# Patient Record
Sex: Male | Born: 1980 | Race: Black or African American | Hispanic: No | Marital: Single | Smoking: Never smoker
Health system: Southern US, Community
[De-identification: ages and names within clinical notes are randomized; demographics above are authoritative.]

## PROBLEM LIST (undated history)

## (undated) DIAGNOSIS — F329 Major depressive disorder, single episode, unspecified: Secondary | ICD-10-CM

## (undated) DIAGNOSIS — E1142 Type 2 diabetes mellitus with diabetic polyneuropathy: Secondary | ICD-10-CM

## (undated) DIAGNOSIS — D649 Anemia, unspecified: Secondary | ICD-10-CM

## (undated) DIAGNOSIS — N186 End stage renal disease: Secondary | ICD-10-CM

## (undated) DIAGNOSIS — R569 Unspecified convulsions: Secondary | ICD-10-CM

## (undated) DIAGNOSIS — H5440 Blindness, one eye, unspecified eye: Secondary | ICD-10-CM

## (undated) DIAGNOSIS — F32A Depression, unspecified: Secondary | ICD-10-CM

## (undated) DIAGNOSIS — R011 Cardiac murmur, unspecified: Secondary | ICD-10-CM

## (undated) DIAGNOSIS — I469 Cardiac arrest, cause unspecified: Secondary | ICD-10-CM

## (undated) DIAGNOSIS — E109 Type 1 diabetes mellitus without complications: Secondary | ICD-10-CM

## (undated) DIAGNOSIS — Z992 Dependence on renal dialysis: Secondary | ICD-10-CM

## (undated) DIAGNOSIS — I10 Essential (primary) hypertension: Secondary | ICD-10-CM

## (undated) HISTORY — PX: EYE SURGERY: SHX253

---
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
## 2002-01-07 ENCOUNTER — Inpatient Hospital Stay (HOSPITAL_COMMUNITY): Admission: EM | Admit: 2002-01-07 | Discharge: 2002-01-09 | Payer: Self-pay | Admitting: Emergency Medicine

## 2002-01-07 ENCOUNTER — Encounter: Payer: Self-pay | Admitting: Infectious Diseases

## 2002-01-08 ENCOUNTER — Encounter: Payer: Self-pay | Admitting: Infectious Diseases

## 2002-01-16 ENCOUNTER — Encounter: Admission: RE | Admit: 2002-01-16 | Discharge: 2002-01-16 | Payer: Self-pay | Admitting: Internal Medicine

## 2002-03-10 ENCOUNTER — Emergency Department (HOSPITAL_COMMUNITY): Admission: EM | Admit: 2002-03-10 | Discharge: 2002-03-10 | Payer: Self-pay

## 2002-03-15 ENCOUNTER — Encounter: Admission: RE | Admit: 2002-03-15 | Discharge: 2002-03-15 | Payer: Self-pay | Admitting: Internal Medicine

## 2002-04-15 ENCOUNTER — Inpatient Hospital Stay (HOSPITAL_COMMUNITY): Admission: EM | Admit: 2002-04-15 | Discharge: 2002-04-17 | Payer: Self-pay | Admitting: Emergency Medicine

## 2002-04-15 ENCOUNTER — Encounter: Payer: Self-pay | Admitting: Emergency Medicine

## 2002-04-16 ENCOUNTER — Encounter: Payer: Self-pay | Admitting: Internal Medicine

## 2002-04-17 ENCOUNTER — Encounter: Admission: RE | Admit: 2002-04-17 | Discharge: 2002-04-17 | Payer: Self-pay | Admitting: Internal Medicine

## 2002-04-27 ENCOUNTER — Emergency Department (HOSPITAL_COMMUNITY): Admission: EM | Admit: 2002-04-27 | Discharge: 2002-04-28 | Payer: Self-pay | Admitting: Emergency Medicine

## 2002-05-04 ENCOUNTER — Observation Stay (HOSPITAL_COMMUNITY): Admission: EM | Admit: 2002-05-04 | Discharge: 2002-05-05 | Payer: Self-pay | Admitting: Emergency Medicine

## 2002-05-04 ENCOUNTER — Encounter: Payer: Self-pay | Admitting: Emergency Medicine

## 2002-05-26 ENCOUNTER — Inpatient Hospital Stay (HOSPITAL_COMMUNITY): Admission: EM | Admit: 2002-05-26 | Discharge: 2002-05-29 | Payer: Self-pay | Admitting: Emergency Medicine

## 2002-07-21 ENCOUNTER — Encounter: Payer: Self-pay | Admitting: Internal Medicine

## 2002-07-21 ENCOUNTER — Inpatient Hospital Stay (HOSPITAL_COMMUNITY): Admission: EM | Admit: 2002-07-21 | Discharge: 2002-07-22 | Payer: Self-pay | Admitting: Emergency Medicine

## 2002-07-24 ENCOUNTER — Inpatient Hospital Stay (HOSPITAL_COMMUNITY): Admission: EM | Admit: 2002-07-24 | Discharge: 2002-07-25 | Payer: Self-pay

## 2002-07-29 ENCOUNTER — Encounter: Admission: RE | Admit: 2002-07-29 | Discharge: 2002-07-29 | Payer: Self-pay | Admitting: Internal Medicine

## 2002-11-21 ENCOUNTER — Emergency Department (HOSPITAL_COMMUNITY): Admission: EM | Admit: 2002-11-21 | Discharge: 2002-11-22 | Payer: Self-pay

## 2002-11-22 ENCOUNTER — Inpatient Hospital Stay (HOSPITAL_COMMUNITY): Admission: EM | Admit: 2002-11-22 | Discharge: 2002-11-26 | Payer: Self-pay

## 2002-12-29 ENCOUNTER — Encounter: Payer: Self-pay | Admitting: Emergency Medicine

## 2002-12-29 ENCOUNTER — Inpatient Hospital Stay (HOSPITAL_COMMUNITY): Admission: EM | Admit: 2002-12-29 | Discharge: 2003-01-01 | Payer: Self-pay | Admitting: *Deleted

## 2003-02-03 ENCOUNTER — Inpatient Hospital Stay (HOSPITAL_COMMUNITY): Admission: EM | Admit: 2003-02-03 | Discharge: 2003-02-05 | Payer: Self-pay | Admitting: Emergency Medicine

## 2003-04-04 ENCOUNTER — Inpatient Hospital Stay (HOSPITAL_COMMUNITY): Admission: EM | Admit: 2003-04-04 | Discharge: 2003-04-08 | Payer: Self-pay | Admitting: Emergency Medicine

## 2003-10-26 ENCOUNTER — Emergency Department (HOSPITAL_COMMUNITY): Admission: EM | Admit: 2003-10-26 | Discharge: 2003-10-26 | Payer: Self-pay | Admitting: Emergency Medicine

## 2003-12-26 ENCOUNTER — Emergency Department (HOSPITAL_COMMUNITY): Admission: EM | Admit: 2003-12-26 | Discharge: 2003-12-26 | Payer: Self-pay | Admitting: Emergency Medicine

## 2004-01-21 ENCOUNTER — Inpatient Hospital Stay (HOSPITAL_COMMUNITY): Admission: EM | Admit: 2004-01-21 | Discharge: 2004-01-23 | Payer: Self-pay | Admitting: Emergency Medicine

## 2004-02-04 ENCOUNTER — Ambulatory Visit: Payer: Self-pay | Admitting: Internal Medicine

## 2004-02-17 ENCOUNTER — Inpatient Hospital Stay (HOSPITAL_COMMUNITY): Admission: EM | Admit: 2004-02-17 | Discharge: 2004-02-18 | Payer: Self-pay

## 2005-10-12 ENCOUNTER — Emergency Department (HOSPITAL_COMMUNITY): Admission: EM | Admit: 2005-10-12 | Discharge: 2005-10-13 | Payer: Self-pay | Admitting: Emergency Medicine

## 2012-09-05 DIAGNOSIS — I10 Essential (primary) hypertension: Secondary | ICD-10-CM | POA: Diagnosis not present

## 2012-09-05 DIAGNOSIS — E11319 Type 2 diabetes mellitus with unspecified diabetic retinopathy without macular edema: Secondary | ICD-10-CM | POA: Diagnosis not present

## 2012-09-05 DIAGNOSIS — Z13220 Encounter for screening for lipoid disorders: Secondary | ICD-10-CM | POA: Diagnosis not present

## 2012-09-05 DIAGNOSIS — N529 Male erectile dysfunction, unspecified: Secondary | ICD-10-CM | POA: Diagnosis not present

## 2012-09-05 DIAGNOSIS — E109 Type 1 diabetes mellitus without complications: Secondary | ICD-10-CM | POA: Diagnosis not present

## 2012-09-13 DIAGNOSIS — E11359 Type 2 diabetes mellitus with proliferative diabetic retinopathy without macular edema: Secondary | ICD-10-CM | POA: Diagnosis not present

## 2012-09-13 DIAGNOSIS — E1039 Type 1 diabetes mellitus with other diabetic ophthalmic complication: Secondary | ICD-10-CM | POA: Diagnosis not present

## 2012-09-26 ENCOUNTER — Encounter (INDEPENDENT_AMBULATORY_CARE_PROVIDER_SITE_OTHER): Payer: Self-pay | Admitting: Ophthalmology

## 2012-09-27 ENCOUNTER — Encounter (INDEPENDENT_AMBULATORY_CARE_PROVIDER_SITE_OTHER): Payer: Self-pay | Admitting: Ophthalmology

## 2012-10-17 ENCOUNTER — Encounter (INDEPENDENT_AMBULATORY_CARE_PROVIDER_SITE_OTHER): Payer: Self-pay | Admitting: Ophthalmology

## 2012-11-02 DIAGNOSIS — E785 Hyperlipidemia, unspecified: Secondary | ICD-10-CM | POA: Diagnosis not present

## 2012-11-02 DIAGNOSIS — I10 Essential (primary) hypertension: Secondary | ICD-10-CM | POA: Diagnosis not present

## 2012-11-02 DIAGNOSIS — E109 Type 1 diabetes mellitus without complications: Secondary | ICD-10-CM | POA: Diagnosis not present

## 2012-11-06 ENCOUNTER — Emergency Department (HOSPITAL_COMMUNITY)
Admission: EM | Admit: 2012-11-06 | Discharge: 2012-11-06 | Disposition: A | Discharge status: Home / Self Care | Payer: Medicare Other | Attending: Emergency Medicine | Admitting: Emergency Medicine

## 2012-11-06 ENCOUNTER — Encounter (HOSPITAL_COMMUNITY): Payer: Self-pay | Admitting: *Deleted

## 2012-11-06 DIAGNOSIS — Z794 Long term (current) use of insulin: Secondary | ICD-10-CM | POA: Diagnosis not present

## 2012-11-06 DIAGNOSIS — Z79899 Other long term (current) drug therapy: Secondary | ICD-10-CM | POA: Insufficient documentation

## 2012-11-06 DIAGNOSIS — N289 Disorder of kidney and ureter, unspecified: Secondary | ICD-10-CM | POA: Insufficient documentation

## 2012-11-06 DIAGNOSIS — I10 Essential (primary) hypertension: Secondary | ICD-10-CM | POA: Insufficient documentation

## 2012-11-06 DIAGNOSIS — E1169 Type 2 diabetes mellitus with other specified complication: Secondary | ICD-10-CM | POA: Diagnosis not present

## 2012-11-06 DIAGNOSIS — E162 Hypoglycemia, unspecified: Secondary | ICD-10-CM

## 2012-11-06 HISTORY — DX: Essential (primary) hypertension: I10

## 2012-11-06 LAB — CBC WITH DIFFERENTIAL/PLATELET
Basophils Absolute: 0 K/uL (ref 0.0–0.1)
Basophils Relative: 0 % (ref 0–1)
Eosinophils Absolute: 0 K/uL (ref 0.0–0.7)
Eosinophils Relative: 0 % (ref 0–5)
HCT: 36.1 % — ABNORMAL LOW (ref 39.0–52.0)
Hemoglobin: 12.3 g/dL — ABNORMAL LOW (ref 13.0–17.0)
Lymphocytes Relative: 16 % (ref 12–46)
Lymphs Abs: 1.4 K/uL (ref 0.7–4.0)
MCH: 28.1 pg (ref 26.0–34.0)
MCHC: 34.1 g/dL (ref 30.0–36.0)
MCV: 82.6 fL (ref 78.0–100.0)
Monocytes Absolute: 0.5 K/uL (ref 0.1–1.0)
Monocytes Relative: 5 % (ref 3–12)
Neutro Abs: 7 K/uL (ref 1.7–7.7)
Neutrophils Relative %: 78 % — ABNORMAL HIGH (ref 43–77)
Platelets: 292 K/uL (ref 150–400)
RBC: 4.37 MIL/uL (ref 4.22–5.81)
RDW: 13.3 % (ref 11.5–15.5)
WBC: 8.9 K/uL (ref 4.0–10.5)

## 2012-11-06 LAB — BASIC METABOLIC PANEL WITH GFR
BUN: 17 mg/dL (ref 6–23)
CO2: 20 meq/L (ref 19–32)
Calcium: 8.1 mg/dL — ABNORMAL LOW (ref 8.4–10.5)
Chloride: 106 meq/L (ref 96–112)
Creatinine, Ser: 1.64 mg/dL — ABNORMAL HIGH (ref 0.50–1.35)
GFR calc Af Amer: 63 mL/min — ABNORMAL LOW
GFR calc non Af Amer: 55 mL/min — ABNORMAL LOW
Glucose, Bld: 285 mg/dL — ABNORMAL HIGH (ref 70–99)
Potassium: 4.3 meq/L (ref 3.5–5.1)
Sodium: 134 meq/L — ABNORMAL LOW (ref 135–145)

## 2012-11-06 LAB — GLUCOSE, CAPILLARY
Glucose-Capillary: 162 mg/dL — ABNORMAL HIGH (ref 70–99)
Glucose-Capillary: 253 mg/dL — ABNORMAL HIGH (ref 70–99)

## 2012-11-06 MED ORDER — MORPHINE SULFATE 4 MG/ML IJ SOLN: 4.0000 mg | Freq: Once | INTRAMUSCULAR | Status: DC

## 2012-11-06 MED ORDER — MORPHINE SULFATE 2 MG/ML IJ SOLN
2.0000 mg | Freq: Once | INTRAMUSCULAR | Status: AC
  Administered 2012-11-06: 2 mg via INTRAVENOUS

## 2012-11-06 MED ORDER — MORPHINE SULFATE 2 MG/ML IJ SOLN
INTRAMUSCULAR | Status: AC
  Administered 2012-11-06: 2 mg via INTRAVENOUS
  Filled 2012-11-06: qty 1

## 2012-11-06 MED ORDER — LIDOCAINE HCL (CARDIAC) 20 MG/ML IV SOLN
50.0000 mg | Freq: Once | INTRAVENOUS | Status: AC
  Administered 2012-11-06: 50 mg via INTRAVENOUS

## 2012-11-06 MED ORDER — LIDOCAINE HCL (CARDIAC) 20 MG/ML IV SOLN
INTRAVENOUS | Status: AC
  Administered 2012-11-06: 50 mg via INTRAVENOUS
  Filled 2012-11-06: qty 5

## 2012-11-06 NOTE — ED Notes (Signed)
Dr. Roxanne Mins into room

## 2012-11-06 NOTE — ED Notes (Signed)
Given juice per request

## 2012-11-06 NOTE — ED Notes (Signed)
RT at [facility] drawing labs from R radial artery.

## 2012-11-06 NOTE — ED Provider Notes (Signed)
CSN: [id number]     Arrival date & time 11/06/12  0114 History   First MD Initiated Contact with Patient 11/06/12 0235     Chief Complaint  Patient presents with  . Hypoglycemia   (Consider location/radiation/quality/duration/timing/severity/associated sxs/prior Treatment) Patient is a 32 y.o. male presenting with hypoglycemia. The history is provided by the patient.  Hypoglycemia He was found unresponsive by his father. EMS arrived and blood sugar was 24. You're unable to get an IV started and he did not respond to IV glucagon so intraosseous line was started and he was given dextrose through the intraosseous line with and regained normal mentation. He states that he been eating normally. His blood sugar of 5 PM was about 330 and he took 4 units of insulin for coverage. He ate his normal dinner. He does not remember getting shaky or jittery or sweaty.  Past Medical History  Diagnosis Date  . Diabetes mellitus without complication   . Hypertension    History reviewed. No pertinent past surgical history. No family history on file. History  Substance Use Topics  . Smoking status: Never Smoker   . Smokeless tobacco: Not on file  . Alcohol Use: Yes     Comment: rare    Review of Systems  All other systems reviewed and are negative.    Allergies  Review of patient's allergies indicates no known allergies.  Home Medications   Current Outpatient Rx  Name  Route  Sig  Dispense  Refill  . insulin glargine (LANTUS) 100 UNIT/ML injection   Subcutaneous   Inject 40 Units into the skin every morning.         . insulin lispro (HUMALOG) 100 UNIT/ML injection   Subcutaneous   Inject 2-10 Units into the skin 3 (three) times daily before meals.         Marland Kitchen lisinopril (PRINIVIL,ZESTRIL) 10 MG tablet   Oral   Take 20 mg by mouth daily.         . simvastatin (ZOCOR) 20 MG tablet   Oral   Take 20 mg by mouth every evening.          BP 164/94  Pulse 81  Temp(Src) 98 F (36.7  C) (Oral)  Resp 20  Ht 5\' 11"  (1.803 m)  Wt 200 lb (90.719 kg)  BMI 27.91 kg/m2  SpO2 98% Physical Exam  Nursing note and vitals reviewed.  .32 year old male, resting comfortably and in no acute distress. Vital signs are significant for hypertension with blood pressure 164/94. Oxygen saturation is 98%, which is normal. Head is normocephalic and atraumatic. PERRLA, EOMI. Oropharynx is clear. Neck is nontender and supple without adenopathy or JVD. Back is nontender and there is no CVA tenderness. Lungs are clear without rales, wheezes, or rhonchi. Chest is nontender. Heart has regular rate and rhythm without murmur. Abdomen is soft, flat, nontender without masses or hepatosplenomegaly and peristalsis is normoactive. Extremities have no cyanosis or edema, full range of motion is present. Skin is warm and dry without rash. Neurologic: Mental status is normal, cranial nerves are intact, there are no motor or sensory deficits.  ED Course  Procedures (including critical care time) Labs Review Results for orders placed during the hospital encounter of 11/06/12  GLUCOSE, CAPILLARY      Result Value Range   Glucose-Capillary 162 (*) 70 - 99 mg/dL   Comment 1 Notify RN     Comment 2 Documented in Chart    BASIC METABOLIC PANEL  Result Value Range   Sodium 134 (*) 135 - 145 mEq/L   Potassium 4.3  3.5 - 5.1 mEq/L   Chloride 106  96 - 112 mEq/L   CO2 20  19 - 32 mEq/L   Glucose, Bld 285 (*) 70 - 99 mg/dL   BUN 17  6 - 23 mg/dL   Creatinine, Ser 1.64 (*) 0.50 - 1.35 mg/dL   Calcium 8.1 (*) 8.4 - 10.5 mg/dL   GFR calc non Af Amer 55 (*) >90 mL/min   GFR calc Af Amer 63 (*) >90 mL/min  CBC WITH DIFFERENTIAL      Result Value Range   WBC 8.9  4.0 - 10.5 K/uL   RBC 4.37  4.22 - 5.81 MIL/uL   Hemoglobin 12.3 (*) 13.0 - 17.0 g/dL   HCT 36.1 (*) 39.0 - 52.0 %   MCV 82.6  78.0 - 100.0 fL   MCH 28.1  26.0 - 34.0 pg   MCHC 34.1  30.0 - 36.0 g/dL   RDW 13.3  11.5 - 15.5 %   Platelets  292  150 - 400 K/uL   Neutrophils Relative % 78 (*) 43 - 77 %   Neutro Abs 7.0  1.7 - 7.7 K/uL   Lymphocytes Relative 16  12 - 46 %   Lymphs Abs 1.4  0.7 - 4.0 K/uL   Monocytes Relative 5  3 - 12 %   Monocytes Absolute 0.5  0.1 - 1.0 K/uL   Eosinophils Relative 0  0 - 5 %   Eosinophils Absolute 0.0  0.0 - 0.7 K/uL   Basophils Relative 0  0 - 1 %   Basophils Absolute 0.0  0.0 - 0.1 K/uL  GLUCOSE, CAPILLARY      Result Value Range   Glucose-Capillary 253 (*) 70 - 99 mg/dL   Comment 1 Documented in Chart     Comment 2 Notify RN    GLUCOSE, CAPILLARY      Result Value Range   Glucose-Capillary 306 (*) 70 - 99 mg/dL   Comment 1 Documented in Chart     Comment 2 Notify RN     MDM   1. Hypoglycemia   2. Renal insufficiency    Hypoglycemia. Blood sugar has come up with dextrose and he has been given a meal in the ED. Renal insufficiency is noted. Mild hyponatremia is present but is clearly from hyperglycemia as blood sugar on metabolic panel was [id number] following intravenous glucose.  Blood sugar has remained stable in the ED and he is discharged. Because of severe lability of his blood sugars, and discussed with him the desirability of this being a diabetic specialist. I also discussed the implication renal insufficiency can have on his blood sugar management.  Delora Fuel, MD [phone] [id number]

## 2012-11-06 NOTE — ED Notes (Signed)
Father into room, at [facility].

## 2012-11-06 NOTE — ED Notes (Addendum)
Here by EMS from home for low blood sugar, EMS called by parents, pt found unresponsive with snoring respirations, cbg found to be 24. EMS unable to establish IV. Glucagon given in deltoid. IO started in R tibia (painful to pt). Lidocaine 40mg , versed 2.5mg , fentanyl 18mcg, and D10 250cc given IV. cbg PTA was 126. Pt alert, NAD, calm, interactive, lying flat supine on scoop LSB (for transfer purposes only), h/o htn and DM, takes regular humalog, lantus, lisinopril and simvastatin. Last ate 1830. humalog 4 units of coverage at 1830 reported.

## 2012-11-06 NOTE — ED Notes (Signed)
Alert, NAD, calm, interactive, skin W&D, resps e/u, speaking in clear complete sentences, eating sandwich, pending lab results. Moved to room D34.

## 2013-02-08 ENCOUNTER — Emergency Department (HOSPITAL_COMMUNITY)
Admission: EM | Admit: 2013-02-08 | Discharge: 2013-02-08 | Disposition: A | Discharge status: Home / Self Care | Payer: Medicare Other | Attending: Emergency Medicine | Admitting: Emergency Medicine

## 2013-02-08 ENCOUNTER — Encounter (HOSPITAL_COMMUNITY): Payer: Self-pay | Admitting: Emergency Medicine

## 2013-02-08 DIAGNOSIS — Z79899 Other long term (current) drug therapy: Secondary | ICD-10-CM | POA: Insufficient documentation

## 2013-02-08 DIAGNOSIS — I10 Essential (primary) hypertension: Secondary | ICD-10-CM | POA: Diagnosis not present

## 2013-02-08 DIAGNOSIS — E162 Hypoglycemia, unspecified: Secondary | ICD-10-CM

## 2013-02-08 DIAGNOSIS — E1169 Type 2 diabetes mellitus with other specified complication: Secondary | ICD-10-CM | POA: Insufficient documentation

## 2013-02-08 DIAGNOSIS — R51 Headache: Secondary | ICD-10-CM | POA: Diagnosis not present

## 2013-02-08 DIAGNOSIS — Z794 Long term (current) use of insulin: Secondary | ICD-10-CM | POA: Insufficient documentation

## 2013-02-08 LAB — GLUCOSE, CAPILLARY: Glucose-Capillary: 91 mg/dL (ref 70–99)

## 2013-02-08 MED ORDER — OXYCODONE-ACETAMINOPHEN 5-325 MG PO TABS
1.0000 | ORAL_TABLET | Freq: Once | ORAL | Status: AC
  Administered 2013-02-08: 1 via ORAL
  Filled 2013-02-08: qty 1

## 2013-02-08 NOTE — ED Notes (Signed)
EMS was called for low blood sugar and headache,

## 2013-02-08 NOTE — ED Notes (Signed)
Pt states he has a headache that started today,  Lights are dimmed for comfort measures,  Pt is given sandwich and diet coke for low glucose,  Pt has also received D50 1 amp by EMS

## 2013-02-08 NOTE — ED Notes (Signed)
Bed: WA08 Expected date:  Expected time:  Means of arrival:  Comments: EMS hypoglycemia

## 2013-02-08 NOTE — ED Provider Notes (Signed)
CSN: [id number]     Arrival date & time 02/08/13  2128 History   First MD Initiated Contact with Patient 02/08/13 2243     Chief Complaint  Patient presents with  . Hypoglycemia  . Headache    HPI The patient reports compliance with his medications.  He reports he last today and was found to be hypoglycemic.  He reports mild headache at this time.  No nausea vomiting diarrhea.  She's recently his blood sugars have been running low and his endocrinologist has been decreasing his nighttime Lantus dose.  No recent fevers or chills.  No recent illness.  Symptoms are mild in severity.  EMS gave an amp of D50.  She feels better this time.  Patient tolerated food in the ER   Past Medical History  Diagnosis Date  . Diabetes mellitus without complication   . Hypertension    History reviewed. No pertinent past surgical history. History reviewed. No pertinent family history. History  Substance Use Topics  . Smoking status: Never Smoker   . Smokeless tobacco: Not on file  . Alcohol Use: Yes     Comment: rare    Review of Systems  All other systems reviewed and are negative.    Allergies  Review of patient's allergies indicates no known allergies.  Home Medications   Current Outpatient Rx  Name  Route  Sig  Dispense  Refill  . insulin glargine (LANTUS) 100 UNIT/ML injection   Subcutaneous   Inject 40 Units into the skin every morning.         . insulin lispro (HUMALOG) 100 UNIT/ML injection   Subcutaneous   Inject 2-10 Units into the skin 3 (three) times daily before meals.         Marland Kitchen lisinopril (PRINIVIL,ZESTRIL) 10 MG tablet   Oral   Take 10 mg by mouth daily.           BP 174/98  Pulse 86  Temp(Src) 97.7 F (36.5 C) (Oral)  Resp 16  SpO2 98% Physical Exam  Nursing note and vitals reviewed. Constitutional: He is oriented to person, place, and time. He appears well-developed and well-nourished.  HENT:  Head: Normocephalic and atraumatic.  Eyes: EOM are normal.   Neck: Normal range of motion.  Cardiovascular: Normal rate, regular rhythm, normal heart sounds and intact distal pulses.   Pulmonary/Chest: Effort normal and breath sounds normal. No respiratory distress.  Abdominal: Soft. He exhibits no distension. There is no tenderness.  Musculoskeletal: Normal range of motion.  Neurological: He is alert and oriented to person, place, and time.  Skin: Skin is warm and dry.  Psychiatric: He has a normal mood and affect. Judgment normal.    ED Course  Procedures (including critical care time) Labs Review Labs Reviewed  GLUCOSE, CAPILLARY   Imaging Review No results found.  EKG Interpretation   None       MDM   1. Hypoglycemia    This will decrease his Lantus dose to 35 units.  I've instructed him to followup with his endocrinologist.  Have asked that he eat consistent meals    Hoy Morn, MD 02/08/13 2300

## 2013-03-06 DIAGNOSIS — I10 Essential (primary) hypertension: Secondary | ICD-10-CM | POA: Diagnosis not present

## 2013-03-06 DIAGNOSIS — E109 Type 1 diabetes mellitus without complications: Secondary | ICD-10-CM | POA: Diagnosis not present

## 2013-03-06 DIAGNOSIS — N529 Male erectile dysfunction, unspecified: Secondary | ICD-10-CM | POA: Diagnosis not present

## 2013-03-06 DIAGNOSIS — E785 Hyperlipidemia, unspecified: Secondary | ICD-10-CM | POA: Diagnosis not present

## 2013-05-31 ENCOUNTER — Emergency Department (HOSPITAL_COMMUNITY): Payer: Medicare Other

## 2013-05-31 ENCOUNTER — Inpatient Hospital Stay (HOSPITAL_COMMUNITY)
Admit: 2013-05-31 | Discharge: 2013-05-31 | Disposition: A | Discharge status: Home / Self Care | Payer: Medicare Other | Attending: Internal Medicine | Admitting: Internal Medicine

## 2013-05-31 ENCOUNTER — Inpatient Hospital Stay (HOSPITAL_COMMUNITY)
Admission: EM | Admit: 2013-05-31 | Discharge: 2013-06-04 | DRG: 101 | Disposition: A | Discharge status: Home / Self Care | Payer: Medicare Other | Attending: Internal Medicine | Admitting: Internal Medicine

## 2013-05-31 ENCOUNTER — Encounter (HOSPITAL_COMMUNITY): Payer: Self-pay | Admitting: Emergency Medicine

## 2013-05-31 DIAGNOSIS — R413 Other amnesia: Secondary | ICD-10-CM | POA: Diagnosis present

## 2013-05-31 DIAGNOSIS — N183 Chronic kidney disease, stage 3 unspecified: Secondary | ICD-10-CM | POA: Diagnosis present

## 2013-05-31 DIAGNOSIS — IMO0001 Reserved for inherently not codable concepts without codable children: Secondary | ICD-10-CM | POA: Diagnosis present

## 2013-05-31 DIAGNOSIS — H539 Unspecified visual disturbance: Secondary | ICD-10-CM | POA: Diagnosis not present

## 2013-05-31 DIAGNOSIS — E1069 Type 1 diabetes mellitus with other specified complication: Secondary | ICD-10-CM | POA: Diagnosis present

## 2013-05-31 DIAGNOSIS — Z8249 Family history of ischemic heart disease and other diseases of the circulatory system: Secondary | ICD-10-CM | POA: Diagnosis not present

## 2013-05-31 DIAGNOSIS — R0789 Other chest pain: Secondary | ICD-10-CM | POA: Diagnosis not present

## 2013-05-31 DIAGNOSIS — N179 Acute kidney failure, unspecified: Secondary | ICD-10-CM | POA: Diagnosis present

## 2013-05-31 DIAGNOSIS — I43 Cardiomyopathy in diseases classified elsewhere: Secondary | ICD-10-CM | POA: Diagnosis present

## 2013-05-31 DIAGNOSIS — H543 Unqualified visual loss, both eyes: Secondary | ICD-10-CM | POA: Diagnosis present

## 2013-05-31 DIAGNOSIS — I130 Hypertensive heart and chronic kidney disease with heart failure and stage 1 through stage 4 chronic kidney disease, or unspecified chronic kidney disease: Secondary | ICD-10-CM | POA: Diagnosis present

## 2013-05-31 DIAGNOSIS — R569 Unspecified convulsions: Secondary | ICD-10-CM | POA: Diagnosis present

## 2013-05-31 DIAGNOSIS — I10 Essential (primary) hypertension: Secondary | ICD-10-CM

## 2013-05-31 DIAGNOSIS — Z794 Long term (current) use of insulin: Secondary | ICD-10-CM | POA: Diagnosis not present

## 2013-05-31 DIAGNOSIS — G40909 Epilepsy, unspecified, not intractable, without status epilepticus: Secondary | ICD-10-CM | POA: Diagnosis present

## 2013-05-31 DIAGNOSIS — R609 Edema, unspecified: Secondary | ICD-10-CM | POA: Diagnosis not present

## 2013-05-31 DIAGNOSIS — N182 Chronic kidney disease, stage 2 (mild): Secondary | ICD-10-CM | POA: Diagnosis present

## 2013-05-31 DIAGNOSIS — E119 Type 2 diabetes mellitus without complications: Secondary | ICD-10-CM

## 2013-05-31 DIAGNOSIS — R5381 Other malaise: Secondary | ICD-10-CM | POA: Diagnosis present

## 2013-05-31 DIAGNOSIS — E875 Hyperkalemia: Secondary | ICD-10-CM | POA: Diagnosis not present

## 2013-05-31 DIAGNOSIS — M79609 Pain in unspecified limb: Secondary | ICD-10-CM | POA: Diagnosis not present

## 2013-05-31 DIAGNOSIS — R5383 Other fatigue: Secondary | ICD-10-CM

## 2013-05-31 DIAGNOSIS — M6282 Rhabdomyolysis: Secondary | ICD-10-CM | POA: Diagnosis present

## 2013-05-31 DIAGNOSIS — N189 Chronic kidney disease, unspecified: Secondary | ICD-10-CM

## 2013-05-31 DIAGNOSIS — I509 Heart failure, unspecified: Secondary | ICD-10-CM

## 2013-05-31 DIAGNOSIS — M7989 Other specified soft tissue disorders: Secondary | ICD-10-CM | POA: Diagnosis present

## 2013-05-31 DIAGNOSIS — R404 Transient alteration of awareness: Secondary | ICD-10-CM | POA: Diagnosis present

## 2013-05-31 DIAGNOSIS — I5022 Chronic systolic (congestive) heart failure: Secondary | ICD-10-CM | POA: Diagnosis not present

## 2013-05-31 DIAGNOSIS — R4182 Altered mental status, unspecified: Secondary | ICD-10-CM | POA: Diagnosis not present

## 2013-05-31 DIAGNOSIS — M79641 Pain in right hand: Secondary | ICD-10-CM

## 2013-05-31 DIAGNOSIS — I369 Nonrheumatic tricuspid valve disorder, unspecified: Secondary | ICD-10-CM | POA: Diagnosis not present

## 2013-05-31 HISTORY — DX: Unspecified convulsions: R56.9

## 2013-05-31 LAB — POTASSIUM: Potassium: 4.7 mEq/L (ref 3.7–5.3)

## 2013-05-31 LAB — CBC WITH DIFFERENTIAL/PLATELET
Basophils Absolute: 0 10*3/uL (ref 0.0–0.1)
Basophils Relative: 0 % (ref 0–1)
Eosinophils Absolute: 0 10*3/uL (ref 0.0–0.7)
Eosinophils Relative: 0 % (ref 0–5)
HCT: 37.5 % — ABNORMAL LOW (ref 39.0–52.0)
Hemoglobin: 12.8 g/dL — ABNORMAL LOW (ref 13.0–17.0)
Lymphocytes Relative: 20 % (ref 12–46)
Lymphs Abs: 1.3 10*3/uL (ref 0.7–4.0)
MCH: 27.7 pg (ref 26.0–34.0)
MCHC: 34.1 g/dL (ref 30.0–36.0)
MCV: 81.2 fL (ref 78.0–100.0)
Monocytes Absolute: 0.4 10*3/uL (ref 0.1–1.0)
Monocytes Relative: 6 % (ref 3–12)
Neutro Abs: 4.8 10*3/uL (ref 1.7–7.7)
Neutrophils Relative %: 74 % (ref 43–77)
Platelets: 331 10*3/uL (ref 150–400)
RBC: 4.62 MIL/uL (ref 4.22–5.81)
RDW: 13.4 % (ref 11.5–15.5)
WBC: 6.5 10*3/uL (ref 4.0–10.5)

## 2013-05-31 LAB — COMPREHENSIVE METABOLIC PANEL
ALT: 17 U/L (ref 0–53)
AST: 38 U/L — AB (ref 0–37)
Albumin: 2.1 g/dL — ABNORMAL LOW (ref 3.5–5.2)
Alkaline Phosphatase: 110 U/L (ref 39–117)
BUN: 22 mg/dL (ref 6–23)
CALCIUM: 8.6 mg/dL (ref 8.4–10.5)
CO2: 25 meq/L (ref 19–32)
Chloride: 99 mEq/L (ref 96–112)
Creatinine, Ser: 2.16 mg/dL — ABNORMAL HIGH (ref 0.50–1.35)
GFR calc non Af Amer: 39 mL/min — ABNORMAL LOW (ref >90)
GFR, EST AFRICAN AMERICAN: 45 mL/min — AB (ref >90)
GLUCOSE: 355 mg/dL — AB (ref 70–99)
POTASSIUM: 6.3 meq/L — AB (ref 3.7–5.3)
Sodium: 134 mEq/L — ABNORMAL LOW (ref 137–147)
TOTAL PROTEIN: 6.5 g/dL (ref 6.0–8.3)
Total Bilirubin: 0.5 mg/dL (ref 0.3–1.2)

## 2013-05-31 LAB — URINE MICROSCOPIC-ADD ON

## 2013-05-31 LAB — URINALYSIS, ROUTINE W REFLEX MICROSCOPIC
Bilirubin Urine: NEGATIVE
Glucose, UA: >1000 mg/dL — AB
Ketones, ur: NEGATIVE mg/dL
LEUKOCYTES UA: NEGATIVE
Nitrite: NEGATIVE
PH: 7 (ref 5.0–8.0)
Protein, ur: >300 mg/dL — AB
Specific Gravity, Urine: 1.021 (ref 1.005–1.030)
Urobilinogen, UA: 0.2 mg/dL (ref 0.0–1.0)

## 2013-05-31 LAB — LIPASE, BLOOD: Lipase: 49 U/L (ref 11–59)

## 2013-05-31 LAB — HEMOGLOBIN A1C
HEMOGLOBIN A1C: 8.2 % — AB (ref <5.7)
Mean Plasma Glucose: 189 mg/dL — ABNORMAL HIGH (ref <117)

## 2013-05-31 LAB — TROPONIN I: Troponin I: <0.3 ng/mL (ref <0.30)

## 2013-05-31 LAB — CBG MONITORING, ED
GLUCOSE-CAPILLARY: 329 mg/dL — AB (ref 70–99)
Glucose-Capillary: 318 mg/dL — ABNORMAL HIGH (ref 70–99)

## 2013-05-31 LAB — GLUCOSE, CAPILLARY: Glucose-Capillary: 248 mg/dL — ABNORMAL HIGH (ref 70–99)

## 2013-05-31 LAB — RAPID URINE DRUG SCREEN, HOSP PERFORMED
AMPHETAMINES: NOT DETECTED
BARBITURATES: NOT DETECTED
Benzodiazepines: NOT DETECTED
Cocaine: NOT DETECTED
Opiates: NOT DETECTED
TETRAHYDROCANNABINOL: NOT DETECTED

## 2013-05-31 LAB — MRSA PCR SCREENING: MRSA by PCR: NEGATIVE

## 2013-05-31 LAB — I-STAT CG4 LACTIC ACID, ED: LACTIC ACID, VENOUS: 1.65 mmol/L (ref 0.5–2.2)

## 2013-05-31 LAB — CK: Total CK: 1627 U/L — ABNORMAL HIGH (ref 7–232)

## 2013-05-31 MED ORDER — ALBUTEROL SULFATE (2.5 MG/3ML) 0.083% IN NEBU
10.0000 mg | INHALATION_SOLUTION | Freq: Once | RESPIRATORY_TRACT | Status: AC
  Administered 2013-05-31: 10 mg via RESPIRATORY_TRACT
  Filled 2013-05-31 (×2): qty 12

## 2013-05-31 MED ORDER — POLYETHYLENE GLYCOL 3350 17 G PO PACK
17.0000 g | PACK | Freq: Every day | ORAL | Status: DC | PRN
  Filled 2013-05-31: qty 1

## 2013-05-31 MED ORDER — GUAIFENESIN-DM 100-10 MG/5ML PO SYRP: 5.0000 mL | ORAL_SOLUTION | ORAL | Status: DC | PRN

## 2013-05-31 MED ORDER — INSULIN GLARGINE 100 UNIT/ML ~~LOC~~ SOLN
40.0000 [IU] | Freq: Every morning | SUBCUTANEOUS | Status: DC
  Administered 2013-05-31: 40 [IU] via SUBCUTANEOUS
  Filled 2013-05-31 (×2): qty 0.4

## 2013-05-31 MED ORDER — LEVETIRACETAM 500 MG PO TABS
500.0000 mg | ORAL_TABLET | Freq: Two times a day (BID) | ORAL | Status: DC
Start: 2013-05-31 — End: 2013-06-04
  Administered 2013-05-31 – 2013-06-04 (×8): 500 mg via ORAL
  Filled 2013-05-31 (×9): qty 1

## 2013-05-31 MED ORDER — CARVEDILOL 6.25 MG PO TABS
6.2500 mg | ORAL_TABLET | Freq: Two times a day (BID) | ORAL | Status: DC
  Administered 2013-05-31 – 2013-06-04 (×8): 6.25 mg via ORAL
  Filled 2013-05-31 (×10): qty 1

## 2013-05-31 MED ORDER — METOPROLOL TARTRATE 1 MG/ML IV SOLN: 5.0000 mg | INTRAVENOUS | Status: DC | PRN

## 2013-05-31 MED ORDER — SODIUM CHLORIDE 0.9 % IV BOLUS (SEPSIS)
1000.0000 mL | Freq: Once | INTRAVENOUS | Status: AC
  Administered 2013-05-31: 1000 mL via INTRAVENOUS

## 2013-05-31 MED ORDER — SODIUM CHLORIDE 0.9 % IV SOLN: INTRAVENOUS | Status: AC

## 2013-05-31 MED ORDER — INSULIN ASPART 100 UNIT/ML IV SOLN
10.0000 [IU] | Freq: Once | INTRAVENOUS | Status: AC
  Administered 2013-05-31: 10 [IU] via INTRAVENOUS
  Filled 2013-05-31 (×3): qty 0.1

## 2013-05-31 MED ORDER — SODIUM CHLORIDE 0.9 % IJ SOLN
3.0000 mL | Freq: Two times a day (BID) | INTRAMUSCULAR | Status: DC
  Administered 2013-06-01 – 2013-06-04 (×6): 3 mL via INTRAVENOUS

## 2013-05-31 MED ORDER — SODIUM CHLORIDE 0.9 % IV SOLN
INTRAVENOUS | Status: DC
  Administered 2013-05-31 (×2): via INTRAVENOUS

## 2013-05-31 MED ORDER — INSULIN ASPART 100 UNIT/ML ~~LOC~~ SOLN: 0.0000 [IU] | Freq: Every day | SUBCUTANEOUS | Status: DC

## 2013-05-31 MED ORDER — SODIUM POLYSTYRENE SULFONATE 15 GM/60ML PO SUSP
30.0000 g | Freq: Once | ORAL | Status: AC
  Administered 2013-05-31: 30 g via ORAL
  Filled 2013-05-31: qty 120

## 2013-05-31 MED ORDER — HYDROCODONE-ACETAMINOPHEN 5-325 MG PO TABS
1.0000 | ORAL_TABLET | ORAL | Status: DC | PRN
  Administered 2013-05-31: 1 via ORAL
  Administered 2013-05-31 – 2013-06-01 (×2): 2 via ORAL
  Filled 2013-05-31 (×2): qty 2
  Filled 2013-05-31: qty 1

## 2013-05-31 MED ORDER — INSULIN ASPART 100 UNIT/ML ~~LOC~~ SOLN
0.0000 [IU] | Freq: Three times a day (TID) | SUBCUTANEOUS | Status: DC
  Administered 2013-05-31: 5 [IU] via SUBCUTANEOUS
  Administered 2013-06-01: 2 [IU] via SUBCUTANEOUS

## 2013-05-31 MED ORDER — LORAZEPAM 2 MG/ML IJ SOLN: 2.0000 mg | INTRAMUSCULAR | Status: DC | PRN

## 2013-05-31 MED ORDER — DEXTROSE 50 % IV SOLN
1.0000 | Freq: Once | INTRAVENOUS | Status: AC
  Administered 2013-05-31: 50 mL via INTRAVENOUS
  Filled 2013-05-31: qty 50

## 2013-05-31 MED ORDER — HYDRALAZINE HCL 20 MG/ML IJ SOLN
10.0000 mg | INTRAMUSCULAR | Status: AC
  Administered 2013-05-31: 10 mg via INTRAVENOUS
  Filled 2013-05-31 (×2): qty 0.5

## 2013-05-31 MED ORDER — ONDANSETRON HCL 4 MG PO TABS: 4.0000 mg | ORAL_TABLET | Freq: Four times a day (QID) | ORAL | Status: DC | PRN

## 2013-05-31 MED ORDER — ONDANSETRON HCL 4 MG/2ML IJ SOLN: 4.0000 mg | Freq: Four times a day (QID) | INTRAMUSCULAR | Status: DC | PRN

## 2013-05-31 MED ORDER — LEVETIRACETAM 500 MG/5ML IV SOLN
1000.0000 mg | Freq: Once | INTRAVENOUS | Status: AC
  Administered 2013-05-31: 1000 mg via INTRAVENOUS
  Filled 2013-05-31: qty 10

## 2013-05-31 MED ORDER — SODIUM BICARBONATE 8.4 % IV SOLN
50.0000 meq | Freq: Once | INTRAVENOUS | Status: AC
  Administered 2013-05-31: 50 meq via INTRAVENOUS
  Filled 2013-05-31: qty 50

## 2013-05-31 NOTE — Progress Notes (Signed)
Offsite EEG completed at [facility] in ED.

## 2013-05-31 NOTE — Evaluation (Signed)
SLP Cancellation Note  Patient Details Name: NICKLOS COBBS MRN: [id number] DOB: 08/16/1980   Cancelled treatment:       Reason Eval/Treat Not Completed:  (pt passed RN stroke swallow screen, please order swallow evaluation if indicated )   Luanna Salk, [place] [facility] SLP [phone]

## 2013-05-31 NOTE — ED Notes (Signed)
Todd EMT unsuccessful attempt to draw labs. IV nurse at bedside at present time.

## 2013-05-31 NOTE — Procedures (Signed)
EEG report.  Brief clinical history:  32 y/o with a history of seizures, admitted to [facility] due to amnestic episode. Technique: this is a 17 channel routine scalp EEG performed at the bedside with bipolar and monopolar montages arranged in accordance to the international 10/20 system of electrode placement. One channel was dedicated to EKG recording.  The study was performed during wakefulness, drowsiness, and stage 2 sleep. No activating procedures performed.  Description:In the wakeful state, the best background consisted of a medium amplitude, posterior dominant, well sustained, symmetric and reactive 10 Hz rhythm. Drowsiness demonstrated dropout of the alpha rhythm. Stage 2 sleep showed symmetric and synchronous sleep spindles without intermixed epileptiform discharges. No focal or generalized epileptiform discharges noted.  No slowing seen.  EKG showed sinus rhythm.  Impression: this is a normal awake and asleep EEG. Please, be aware that a normal EEG does not exclude the possibility of epilepsy.  Clinical correlation is advised.  Dorian Pod, MD

## 2013-05-31 NOTE — ED Notes (Addendum)
Pt right hand cold to touch. Pt able to grasp, but poorly. Pt reports hx of seizures with last seizure in November 2014. Pt has  Sore to right side of tongue.

## 2013-05-31 NOTE — ED Provider Notes (Addendum)
CSN: [id number]     Arrival date & time 05/31/13  0909 History   First MD Initiated Contact with Patient 05/31/13 [phone]     Chief Complaint  Patient presents with  . Hypertension  . Hyperglycemia     HPI  Patient presents with concern of hypertension, hyperglycemia, and after one episode of amnesia/altered mental status. Patient recalls leaving his house 36 hours ago.  He attended a class, then has a period of amnesia until the last few hours. He recalls awakening in his car, with soreness, fatigue, and LH.  No CP / HA / visual loss. He states that he has been compliant with all meds, including Lisinopril recently. He does endorse a Hx of seizure, though he cannot recall when he last had one.    Past Medical History  Diagnosis Date  . Diabetes mellitus without complication   . Hypertension   . Seizures    History reviewed. No pertinent past surgical history. No family history on file. History  Substance Use Topics  . Smoking status: Never Smoker   . Smokeless tobacco: Not on file  . Alcohol Use: No    Review of Systems  Constitutional:       Per HPI, otherwise negative  HENT:       Per HPI, otherwise negative  Respiratory:       Per HPI, otherwise negative  Cardiovascular:       Per HPI, otherwise negative  Gastrointestinal: Negative for vomiting.  Endocrine:       Negative aside from HPI  Genitourinary:       Neg aside from HPI   Musculoskeletal:       R hand soreness  Skin: Negative for wound.  Neurological: Positive for seizures, weakness and light-headedness. Negative for syncope.      Allergies  Review of patient's allergies indicates no known allergies.  Home Medications   Current Outpatient Rx  Name  Route  Sig  Dispense  Refill  . insulin glargine (LANTUS) 100 UNIT/ML injection   Subcutaneous   Inject 40 Units into the skin every morning.         . insulin lispro (HUMALOG) 100 UNIT/ML injection   Subcutaneous   Inject 2-10 Units into the  skin 3 (three) times daily before meals.         Marland Kitchen lisinopril (PRINIVIL,ZESTRIL) 10 MG tablet   Oral   Take 10 mg by mouth daily.           BP 221/116  Pulse 98  Temp(Src) 98.3 F (36.8 C) (Oral)  Resp 14  SpO2 100% Physical Exam  Nursing note and vitals reviewed. Constitutional: He is oriented to person, place, and time. He appears well-developed.  HENT:  Head: Normocephalic and atraumatic.  Mouth/Throat:    Eyes: Conjunctivae and EOM are normal.  Cardiovascular: Normal rate and regular rhythm.   Pulmonary/Chest: Effort normal. No stridor. No respiratory distress.  Abdominal: He exhibits no distension.  Musculoskeletal: He exhibits no edema.       Arms: Neurological: He is alert and oriented to person, place, and time.  Skin: Skin is warm. He is diaphoretic.  Psychiatric: He has a normal mood and affect.    ED Course  Procedures (including critical care time) Labs Review Labs Reviewed  CBC WITH DIFFERENTIAL - Abnormal; Notable for the following:    Hemoglobin 12.8 (*)    HCT 37.5 (*)    All other components within normal limits  COMPREHENSIVE METABOLIC PANEL - Abnormal;  Notable for the following:    Sodium 134 (*)    Potassium 6.3 (*)    Glucose, Bld 355 (*)    Creatinine, Ser 2.16 (*)    Albumin 2.1 (*)    AST 38 (*)    GFR calc non Af Amer 39 (*)    GFR calc Af Amer 45 (*)    All other components within normal limits  URINALYSIS, ROUTINE W REFLEX MICROSCOPIC - Abnormal; Notable for the following:    Glucose, UA >1000 (*)    Hgb urine dipstick MODERATE (*)    Protein, ur >300 (*)    All other components within normal limits  URINE MICROSCOPIC-ADD ON - Abnormal; Notable for the following:    Casts HYALINE CASTS (*)    All other components within normal limits  CBG MONITORING, ED - Abnormal; Notable for the following:    Glucose-Capillary 329 (*)    All other components within normal limits  LIPASE, BLOOD  TROPONIN I  CK  URINE RAPID DRUG SCREEN  (HOSP PERFORMED)  MYOGLOBIN, URINE  I-STAT CG4 LACTIC ACID, ED   Imaging Review Dg Chest 2 View  05/31/2013   CLINICAL DATA:  Chest discomfort  EXAM: CHEST  2 VIEW  COMPARISON:  04/03/2003  FINDINGS: The heart size and mediastinal contours are within normal limits. Both lungs are clear. The visualized skeletal structures are unremarkable.  IMPRESSION: No active cardiopulmonary disease.   Electronically Signed   By: Franchot Gallo M.D.   On: 05/31/2013 10:37   Ct Head Wo Contrast  05/31/2013   CLINICAL DATA:  Right hand is cold to touch. History of seizures. Sore right side of the tongue. Altered mental status.  EXAM: CT HEAD WITHOUT CONTRAST  TECHNIQUE: Contiguous axial images were obtained from the base of the skull through the vertex without intravenous contrast.  COMPARISON:  CT HEAD W/O CM dated 04/03/2003  FINDINGS: There is no evidence of mass effect, midline shift or extra-axial fluid collections. There is no evidence of a space-occupying lesion or intracranial hemorrhage. There is no evidence of a cortical-based area of acute infarction.  The ventricles and sulci are appropriate for the patient's age. The basal cisterns are patent.  Visualized portions of the orbits are unremarkable. The visualized portions of the paranasal sinuses and mastoid air cells are unremarkable.  The osseous structures are unremarkable.  IMPRESSION: Normal CT brain without intravenous contrast.   Electronically Signed   By: Kathreen Devoid   On: 05/31/2013 10:24   Dg Hand Complete Right  05/31/2013   CLINICAL DATA:  Pain and swelling right hand since this morning, no injury, history diabetes, hypertension  EXAM: RIGHT HAND - COMPLETE 3+ VIEW  COMPARISON:  None  FINDINGS: Deformity of the fifth metacarpal likely result of old healed fracture.  Osseous mineralization normal.  Joint spaces preserved.  No acute fracture, dislocation or bone destruction.  Small corticated ossicle identified at the base of the fifth metacarpal  likely a old ununited fracture fragment.  IMPRESSION: No acute osseous abnormalities.   Electronically Signed   By: Lavonia Dana M.D.   On: 05/31/2013 11:47     EKG Interpretation   Date/Time:  Friday May 31 2013 09:16:46 EDT Ventricular Rate:  98 PR Interval:  139 QRS Duration: 86 QT Interval:  353 QTC Calculation: 451 R Axis:   66 Text Interpretation:  Sinus rhythm ST elev, probable normal early repol  pattern Sinus rhythm T wave abnormality Artifact No significant change  since last  tracing Abnormal ekg Confirmed by Carmin Muskrat  MD [phone])  on 05/31/2013 9:22:12 AM      On arrival the patient is notably hypertensive, with 211/117  12:00 PM Patient appears comfortable  BP 178/97  With hyperkalemia, acute kidney function changes, he will receive: Dextrose, bicarb, kayexelate, insulin, NS, and require admission.    MDM   Final diagnoses:  Hyperkalemia  Hypertension  Hand pain, right    Patient presents after a period of amnesia with pain in his right hand, and generalized discomfort.  Notably, the patient is hypertensive on the initial exam - concerning for encephalopathy.  Additionally, the patient has DM, and there was suspicion of DKA vs. NKHS. It remains unclear what the causative etiology for his period was, though seizure remains a consideration.  Patient's labs here are notable for hyperkalemia, renal failure.  Patient received IV fluid resuscitation empiric, and hydralazine given his blood pressure.  Patient's blood pressure diminished appropriately, and following return of labs he received medication for hyperkalemia w renal injury. Patient required admission.  CRITICAL CARE Performed by: Carmin Muskrat Total critical care time: 35 Critical care time was exclusive of separately billable procedures and treating other patients. Critical care was necessary to treat or prevent imminent or life-threatening deterioration. Critical care was time spent  personally by me on the following activities: development of treatment plan with patient and/or surrogate as well as nursing, discussions with consultants, evaluation of patient's response to treatment, examination of patient, obtaining history from patient or surrogate, ordering and performing treatments and interventions, ordering and review of laboratory studies, ordering and review of radiographic studies, pulse oximetry and re-evaluation of patient's condition.     Carmin Muskrat, MD 05/31/13 1216  Carmin Muskrat, MD 05/31/13 [phone]

## 2013-05-31 NOTE — ED Notes (Signed)
EEG complete

## 2013-05-31 NOTE — Progress Notes (Signed)
*  Preliminary Results* Right upper extremity venous duplex completed. Right upper extremity is negative for deep and superficial vein thrombosis.  05/31/2013 2:55 PM  Maudry Mayhew, RVT, RDCS, RDMS

## 2013-05-31 NOTE — Progress Notes (Signed)
EEG Completed; Results Pending  

## 2013-05-31 NOTE — ED Notes (Signed)
ISTAT drawn by main lab [employer].

## 2013-05-31 NOTE — Progress Notes (Signed)
Hypoglycemic Event  CBG: 60  Treatment:  gave soda and orange juice and peanut butter cracker Symptoms: Sweaty and Hungry thirsty  Follow-up CBG: 73  Recheck time 2250  Possible Reasons for Event:   Comments/MD notified:will continue to monitor patient and will spot check around 2am    Edward Colon D  Remember to initiate Hypoglycemia Order Set & complete

## 2013-05-31 NOTE — ED Notes (Signed)
Main lab on way to attempt ISTAT blood draw.

## 2013-05-31 NOTE — Care Management Note (Addendum)
    Page 1 of 1   06/04/2013     1:58:27 PM   CARE MANAGEMENT NOTE 06/04/2013  Patient:  Edward Colon, Edward Colon   Account Number:  [id number]  Date Initiated:  05/31/2013  Documentation initiated by:  Dessa Phi  Subjective/Objective Assessment:   33 Y/O M ADMITTED W/SEIZURE.[id number].     Action/Plan:   FROM HOME.   Anticipated DC Date:  06/04/2013   Anticipated DC Plan:  [place]  CM consult      Choice offered to / List presented to:             Status of service:  Completed, signed off Medicare Important Message given?   (If response is "NO", the following Medicare IM given date fields will be blank) Date Medicare IM given:   Date Additional Medicare IM given:    Discharge Disposition:  HOME/SELF CARE  Per UR Regulation:  Reviewed for med. necessity/level of care/duration of stay  If discussed at [place] of Stay Meetings, dates discussed:    Comments:  06/04/13 Raoul Ciano RN,BSN NCM [phone] D/C HOME NO [place].  06/03/13 Johnae Friley RN,BSN NCM [place]TICIP[place] D/C NEEDS.  05/31/13 Shakti Fleer RN,BSN NCM [phone] NEURO FOLLOWING.

## 2013-05-31 NOTE — ED Notes (Signed)
Todd EMT at bedside attempting lab draw. IV paged.

## 2013-05-31 NOTE — H&P (Addendum)
Patient Demographics  Edward Colon, is a 33 y.o. male  MRN: [id number]   DOB - 1980/08/17  Admit Date - 05/31/2013  Outpatient Primary MD for the patient is [name], MD   With History of -  Past Medical History  Diagnosis Date  . Diabetes mellitus without complication   . Hypertension   . Seizures       History reviewed. No pertinent past surgical history.  in for   Chief Complaint  Patient presents with  . Hypertension  . Hyperglycemia     HPI  Edward Colon  is a 33 y.o. male, with history of seizures not on any medications last seizure few months ago, hypertension, diabetes mellitus type 2 who was in parking lot in his car and had a seizure-like episode Wednesday night, he does not remember what happened since then, he was subsequently found this morning which is over 24 hours later in his car in a drowsy state, EMS then into the ER.   In the ER he was found to have some tongue bites, his workup was consistent with breakthrough seizure, acute renal failure, hyperkalemia, poorly controlled diabetes mellitus, head CT was unremarkable, patient says that his right hand was stuck under his car seat for a day and is only subjective complaints right now and generalized weakness and right hand pain, right hand x-ray was stable, he was not in DKA and I was called to admit the patient.   Patient denies any fever chills, mild cough, no chest abdominal pain, mild right hand ache/pain, says it is getting better, mild generalized headache, no photophobia, no neck stiffness, no abdominal pain or diarrhea. Denies any alcohol abuse, smoking occasional drug use.     Review of Systems    In addition to the HPI above,   No Fever-chills, No Headache, No changes with Vision or hearing, No problems  swallowing food or Liquids, No Chest pain, Cough or Shortness of Breath, No Abdominal pain, No Nausea or Vommitting, Bowel movements are regular, No Blood in stool or Urine, No dysuria, No new skin rashes or bruises, No new joints pains-aches, mild pain and ache in the right hand No new weakness, tingling, numbness in any extremity, generally weak all over No recent weight gain or loss, No polyuria, polydypsia or polyphagia, No significant Mental Stressors.  A full 10 point Review of Systems was done, except as stated above, all other Review of Systems were negative.   Social History History  Substance Use Topics  . Smoking status: Never Smoker   . Smokeless tobacco: Not on file  . Alcohol Use: No      Family History Denies any history of seizures   Prior to Admission medications   Medication Sig Start Date End Date Taking? Authorizing Provider  insulin glargine (LANTUS) 100 UNIT/ML injection Inject 40 Units into the skin every morning.   Yes Historical Provider, MD  insulin lispro (HUMALOG) 100 UNIT/ML injection Inject  2-10 Units into the skin 3 (three) times daily before meals.   Yes Historical Provider, MD  lisinopril (PRINIVIL,ZESTRIL) 40 MG tablet Take 40 mg by mouth daily.   Yes Historical Provider, MD    No Known Allergies  Physical Exam  Vitals  Blood pressure 178/97, pulse 78, temperature 98.3 F (36.8 C), temperature source Oral, resp. rate 21, SpO2 100.00%.   1. General Young African American male lying in bed in NAD,     2. Normal affect and insight, Not Suicidal or Homicidal, Awake Alert, Oriented X 3.  3. No F.N deficits, ALL C.Nerves Intact, Strength 5/5 all 4 extremities, Sensation intact all 4 extremities, Plantars down going.  4. Ears and Eyes appear Normal, Conjunctivae clear, PERRLA. Moist Oral Mucosa.  5. Supple Neck, No JVD, No cervical lymphadenopathy appriciated, No Carotid Bruits.  6. Symmetrical Chest wall movement, Good air movement  bilaterally, CTAB.  7. RRR, No Gallops, Rubs or Murmurs, No Parasternal Heave.  8. Positive Bowel Sounds, Abdomen Soft, Non tender, No organomegaly appriciated,No rebound -guarding or rigidity.  9.  No Cyanosis, Normal Skin Turgor, No Skin Rash or Bruise. Right hand is mildly swollen, good pulses, good sensation at the tips of his fingers.  10. Good muscle tone,  joints appear normal , no effusions, Normal ROM.  11. No Palpable Lymph Nodes in Neck or Axillae    Data Review  CBC  Recent Labs Lab 05/31/13 1050  WBC 6.5  HGB 12.8*  HCT 37.5*  PLT 331  MCV 81.2  MCH 27.7  MCHC 34.1  RDW 13.4  LYMPHSABS 1.3  MONOABS 0.4  EOSABS 0.0  BASOSABS 0.0   ------------------------------------------------------------------------------------------------------------------  Chemistries   Recent Labs Lab 05/31/13 1050  NA 134*  K 6.3*  CL 99  CO2 25  GLUCOSE 355*  BUN 22  CREATININE 2.16*  CALCIUM 8.6  AST 38*  ALT 17  ALKPHOS 110  BILITOT 0.5   ------------------------------------------------------------------------------------------------------------------ CrCl is unknown because both a height and weight (above a minimum accepted value) are required for this calculation. ------------------------------------------------------------------------------------------------------------------ No results found for this basename: TSH, T4TOTAL, FREET3, T3FREE, THYROIDAB,  in the last 72 hours   Coagulation profile No results found for this basename: INR, PROTIME,  in the last 168 hours ------------------------------------------------------------------------------------------------------------------- No results found for this basename: DDIMER,  in the last 72 hours -------------------------------------------------------------------------------------------------------------------  Cardiac Enzymes  Recent Labs Lab 05/31/13 1050  TROPONINI <0.30    ------------------------------------------------------------------------------------------------------------------ No components found with this basename: POCBNP,    ---------------------------------------------------------------------------------------------------------------  Urinalysis    Component Value Date/Time   COLORURINE YELLOW 05/31/2013 0940   APPEARANCEUR CLEAR 05/31/2013 0940   LABSPEC 1.021 05/31/2013 0940   PHURINE 7.0 05/31/2013 0940   GLUCOSEU >1000* 05/31/2013 0940   HGBUR MODERATE* 05/31/2013 0940   BILIRUBINUR NEGATIVE 05/31/2013 0940   KETONESUR NEGATIVE 05/31/2013 0940   PROTEINUR >300* 05/31/2013 0940   UROBILINOGEN 0.2 05/31/2013 0940   NITRITE NEGATIVE 05/31/2013 0940   LEUKOCYTESUR NEGATIVE 05/31/2013 0940    ----------------------------------------------------------------------------------------------------------------  Imaging results:   Dg Chest 2 View  05/31/2013   CLINICAL DATA:  Chest discomfort  EXAM: CHEST  2 VIEW  COMPARISON:  04/03/2003  FINDINGS: The heart size and mediastinal contours are within normal limits. Both lungs are clear. The visualized skeletal structures are unremarkable.  IMPRESSION: No active cardiopulmonary disease.   Electronically Signed   By: Franchot Gallo M.D.   On: 05/31/2013 10:37   Ct Head Wo Contrast  05/31/2013   CLINICAL DATA:  Right hand is cold to touch. History of seizures. Sore right side of the tongue. Altered mental status.  EXAM: CT HEAD WITHOUT CONTRAST  TECHNIQUE: Contiguous axial images were obtained from the base of the skull through the vertex without intravenous contrast.  COMPARISON:  CT HEAD W/O CM dated 04/03/2003  FINDINGS: There is no evidence of mass effect, midline shift or extra-axial fluid collections. There is no evidence of a space-occupying lesion or intracranial hemorrhage. There is no evidence of a cortical-based area of acute infarction.  The ventricles and sulci are appropriate for the patient's age.  The basal cisterns are patent.  Visualized portions of the orbits are unremarkable. The visualized portions of the paranasal sinuses and mastoid air cells are unremarkable.  The osseous structures are unremarkable.  IMPRESSION: Normal CT brain without intravenous contrast.   Electronically Signed   By: Kathreen Devoid   On: 05/31/2013 10:24   Dg Hand Complete Right  05/31/2013   CLINICAL DATA:  Pain and swelling right hand since this morning, no injury, history diabetes, hypertension  EXAM: RIGHT HAND - COMPLETE 3+ VIEW  COMPARISON:  None  FINDINGS: Deformity of the fifth metacarpal likely result of old healed fracture.  Osseous mineralization normal.  Joint spaces preserved.  No acute fracture, dislocation or bone destruction.  Small corticated ossicle identified at the base of the fifth metacarpal likely a old ununited fracture fragment.  IMPRESSION: No acute osseous abnormalities.   Electronically Signed   By: Lavonia Dana M.D.   On: 05/31/2013 11:47    My personal review of EKG: Rhythm NSR, Rate 98 /min, early repolarization ST changes    Assessment & Plan    1. Seizure - has history of same, head CT unremarkable, obtain urine drug screen, will place on Keppra for now, IV Ativan as needed, bedside swallow eval, EEG, neuro will be consulted.     2.DM-2 - check A1c, continue home dose Lantus along with sliding scale before every meal CHS.      3. ARF (? CKD last Creat 1 yr ago 1.6)  with hyperkalemia- rule out rhabdo, check CK levels, aggressive IV fluids, bladder scan, repeat BMP in the morning. For hyperkalemia oral Kayexalate given, will repeat potassium levels again this evening.      4. Right arm swelling- appears to be soft tissue swelling, x-rays are stable, will elevate right arm, ice pack, rule out DVT, monitor clinically.      5. Hypertension. Due to acute renal failure we'll stop home dose ACE inhibitor, place on Coreg and as needed IV hydralazine     DVT  Prophylaxis  SCDs    AM Labs Ordered, also please review Full Orders  Family Communication: Admission, patients condition and plan of care including tests being ordered have been discussed with the patient  who indicates understanding and agree with the plan and Code Status.  Code Status Full  Likely DC to  Home  Condition Fair  Time spent in minutes : 40    SINGH,PRASHANT K M.D on 05/31/2013 at 12:30 PM  Between 7am to 7pm - Pager - [phone]  After 7pm go to [email or web address] - password [email or web address]  And look for the night coverage person covering me after hours  [facility] Office  [phone]

## 2013-05-31 NOTE — Consult Note (Signed)
NEURO HOSPITALIST CONSULT NOTE    Reason for Consult:seizure  HPI:                                                                                                                                          Edward Colon is an 33 y.o. male who states he has had one seizure on October while at home.  "EMS was called to his house and felt he did not have to go to ED".  He had a second seizure in November at home "but never went to the doctors.".  He has never been placed on seizure medications and never been formally been diagnosed with seizure.He was driving back from a class on Wednesday night and pulled over to talk to test his brother.  HE recalls nothing from then to this morning. He states he slowly became more alert and bale to move. IT was then he noted his family had called him multiple times. He called his dad who recommended he call EMS.    He has had problems controlling his BG and at times he has been "paralyzed from low BG".   Initial workup was consistent with  acute renal failure, hypertensive, hyperkalemia. Potassium 6.3, glucose 355, CK 1627, CT head was normal.    Past Medical History  Diagnosis Date  . Diabetes mellitus without complication   . Hypertension   . Seizures     History reviewed. No pertinent past surgical history.  Family History  Problem Relation Age of Onset  . Hypertension Mother   . Hypertension Father     Social History:  reports that he has never smoked. He has never used smokeless tobacco. He reports that he does not drink alcohol or use illicit drugs.  No Known Allergies  MEDICATIONS:                                                                                                                     Scheduled: . sodium chloride   Intravenous STAT  . carvedilol  6.25 mg Oral BID WC  . insulin aspart  0-15 Units Subcutaneous TID WC  . insulin aspart  0-5 Units Subcutaneous QHS  . insulin glargine  40 Units Subcutaneous  q morning - 10a  . levETIRAcetam  500  mg Oral BID  . sodium chloride  3 mL Intravenous Q12H     ROS:                                                                                                                                       History obtained from the patient  General ROS: negative for - chills, fatigue, fever, night sweats, weight gain or weight loss Psychological ROS: negative for - behavioral disorder, hallucinations, memory difficulties, mood swings or suicidal ideation Ophthalmic ROS: negative for - blurry vision, double vision, eye pain or loss of vision ENT ROS: negative for - epistaxis, nasal discharge, oral lesions, sore throat, tinnitus or vertigo Allergy and Immunology ROS: negative for - hives or itchy/watery eyes Hematological and Lymphatic ROS: negative for - bleeding problems, bruising or swollen lymph nodes Endocrine ROS: negative for - galactorrhea, hair pattern changes, polydipsia/polyuria or temperature intolerance Respiratory ROS: negative for - cough, hemoptysis, shortness of breath or wheezing Cardiovascular ROS: negative for - chest pain, dyspnea on exertion, edema or irregular heartbeat Gastrointestinal ROS: negative for - abdominal pain, diarrhea, hematemesis, nausea/vomiting or stool incontinence Genito-Urinary ROS: negative for - dysuria, hematuria, incontinence or urinary frequency/urgency Musculoskeletal ROS: negative for - joint swelling or muscular weakness Neurological ROS: as noted in HPI Dermatological ROS: negative for rash and skin lesion changes   Blood pressure 177/88, pulse 113, temperature 98 F (36.7 C), temperature source Axillary, resp. rate 20, height 5\' 11"  (1.803 m), SpO2 100.00%.   Neurologic Examination:                                                                                                      Mental Status: Alert, oriented, thought content appropriate.  Speech fluent without evidence of aphasia.  Able to follow 3 step  commands without difficulty. 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  III,IV, VI: ptosis not present, extra-ocular motions intact bilaterally V,VII: smile symmetric, facial light touch sensation normal bilaterally VIII: hearing normal bilaterally IX,X: gag reflex present XI: bilateral shoulder shrug XII: midline tongue extension without atrophy or fasciculations  Motor: Right : Upper extremity   4/5 pain   Left:     Upper extremity   5/5  Lower extremity   5/5     Lower extremity  5/5 Tone and bulk:normal tone throughout; no atrophy noted Sensory: Pinprick and light touch intact throughout, bilaterally Deep Tendon Reflexes:  Depressed reflexes throughout and no AJ  Plantars: Mute bilaterally Cerebellar: normal finger-to-nose,  normal heel-to-shin test Gait: not tested CV: pulses palpable throughout    Lab Results: Basic Metabolic Panel:  Recent Labs Lab 05/31/13 1050  NA 134*  K 6.3*  CL 99  CO2 25  GLUCOSE 355*  BUN 22  CREATININE 2.16*  CALCIUM 8.6    Liver Function Tests:  Recent Labs Lab 05/31/13 1050  AST 38*  ALT 17  ALKPHOS 110  BILITOT 0.5  PROT 6.5  ALBUMIN 2.1*    Recent Labs Lab 05/31/13 1050  LIPASE 49   No results found for this basename: AMMONIA,  in the last 168 hours  CBC:  Recent Labs Lab 05/31/13 1050  WBC 6.5  NEUTROABS 4.8  HGB 12.8*  HCT 37.5*  MCV 81.2  PLT 331    Cardiac Enzymes:  Recent Labs Lab 05/31/13 1050  CKTOTAL 1627*  TROPONINI <0.30    Lipid Panel: No results found for this basename: CHOL, TRIG, HDL, CHOLHDL, VLDL, LDLCALC,  in the last 168 hours  CBG:  Recent Labs Lab 05/31/13 0911 05/31/13 1340  GLUCAP 329* 318*    Microbiology: No results found for this or any previous visit.  Coagulation  Studies: No results found for this basename: LABPROT, INR,  in the last 72 hours  Imaging: Dg Chest 2 View  05/31/2013   CLINICAL DATA:  Chest discomfort  EXAM: CHEST  2 VIEW  COMPARISON:  04/03/2003  FINDINGS: The heart size and mediastinal contours are within normal limits. Both lungs are clear. The visualized skeletal structures are unremarkable.  IMPRESSION: No active cardiopulmonary disease.   Electronically Signed   By: Franchot Gallo M.D.   On: 05/31/2013 10:37   Ct Head Wo Contrast  05/31/2013   CLINICAL DATA:  Right hand is cold to touch. History of seizures. Sore right side of the tongue. Altered mental status.  EXAM: CT HEAD WITHOUT CONTRAST  TECHNIQUE: Contiguous axial images were obtained from the base of the skull through the vertex without intravenous contrast.  COMPARISON:  CT HEAD W/O CM dated 04/03/2003  FINDINGS: There is no evidence of mass effect, midline shift or extra-axial fluid collections. There is no evidence of a space-occupying lesion or intracranial hemorrhage. There is no evidence of a cortical-based area of acute infarction.  The ventricles and sulci are appropriate for the patient's age. The basal cisterns are patent.  Visualized portions of the orbits are unremarkable. The visualized portions of the paranasal sinuses and mastoid air cells are unremarkable.  The osseous structures are unremarkable.  IMPRESSION: Normal CT brain without intravenous contrast.   Electronically Signed   By: Kathreen Devoid   On: 05/31/2013 10:24   Dg Hand Complete Right  05/31/2013   CLINICAL DATA:  Pain and swelling right hand since this morning, no injury, history diabetes, hypertension  EXAM: RIGHT HAND - COMPLETE 3+ VIEW  COMPARISON:  None  FINDINGS: Deformity of the fifth metacarpal likely result of old healed fracture.  Osseous mineralization normal.  Joint spaces preserved.  No acute fracture, dislocation or bone destruction.  Small corticated ossicle identified at the base of the fifth  metacarpal likely a old ununited fracture fragment.  IMPRESSION: No acute osseous abnormalities.   Electronically Signed   By: Lavonia Dana M.D.   On: 05/31/2013 11:47    Assessment and  plan per attending neurologist  Etta Quill PA-C [facility] [phone]  05/31/2013, 3:17 PM   Assessment/Plan: 33 YO male with selfproclaimed seizures in the past.  Patient admitted to [facility] after amnestic period starting Wednesday night till Friday AM.  Unclear etiology but cannot exclude possible seizure with prolonged post ictal states.  EEG shows is normal.   Recommend: 1) Continue Keppra 2) MRI brain with contrast once Cr resolves. MRI has informed me they do not do MRI on weekends unless STAT. Will get on Monday when Creatinine resolves.    Patient seen and examined together with physician assistant and I concur with the assessment and plan.  Dorian Pod, MD

## 2013-05-31 NOTE — ED Notes (Addendum)
Per EMS pt called EMS from car in parking lot. Pt reports "remembers going to class Wednesday night and awoke an hour ago." When pt awoke called dad, ate 2 granola bars, and called EMS. Pt a/o x4 at present time. Pt reports right hand swelling when he awoke. Pt denies injury.

## 2013-05-31 NOTE — ED Notes (Signed)
Bed: [id number] Expected date:  Expected time:  Means of arrival:  Comments: EMS- hypertension, hyperglycemia

## 2013-05-31 NOTE — ED Notes (Signed)
Two unsuccessful IV attempts by this RN. Caren Griffins RN attempt IV as well, unsuccessful.

## 2013-06-01 DIAGNOSIS — E875 Hyperkalemia: Secondary | ICD-10-CM | POA: Diagnosis not present

## 2013-06-01 DIAGNOSIS — I10 Essential (primary) hypertension: Secondary | ICD-10-CM | POA: Diagnosis not present

## 2013-06-01 DIAGNOSIS — E119 Type 2 diabetes mellitus without complications: Secondary | ICD-10-CM | POA: Diagnosis not present

## 2013-06-01 DIAGNOSIS — N179 Acute kidney failure, unspecified: Secondary | ICD-10-CM | POA: Diagnosis not present

## 2013-06-01 LAB — GLUCOSE, CAPILLARY
GLUCOSE-CAPILLARY: 60 mg/dL — AB (ref 70–99)
GLUCOSE-CAPILLARY: 65 mg/dL — AB (ref 70–99)
GLUCOSE-CAPILLARY: 95 mg/dL (ref 70–99)
GLUCOSE-CAPILLARY: 171 mg/dL — AB (ref 70–99)
GLUCOSE-CAPILLARY: 85 mg/dL (ref 70–99)
Glucose-Capillary: 73 mg/dL (ref 70–99)
Glucose-Capillary: 27 mg/dL — CL (ref 70–99)
Glucose-Capillary: 57 mg/dL — ABNORMAL LOW (ref 70–99)
Glucose-Capillary: 45 mg/dL — ABNORMAL LOW (ref 70–99)
Glucose-Capillary: 143 mg/dL — ABNORMAL HIGH (ref 70–99)
Glucose-Capillary: 134 mg/dL — ABNORMAL HIGH (ref 70–99)

## 2013-06-01 LAB — CBC
HCT: 31.6 % — ABNORMAL LOW (ref 39.0–52.0)
Hemoglobin: 10.1 g/dL — ABNORMAL LOW (ref 13.0–17.0)
MCH: 26.8 pg (ref 26.0–34.0)
MCHC: 32 g/dL (ref 30.0–36.0)
MCV: 83.8 fL (ref 78.0–100.0)
Platelets: 280 10*3/uL (ref 150–400)
RBC: 3.77 MIL/uL — ABNORMAL LOW (ref 4.22–5.81)
RDW: 13.9 % (ref 11.5–15.5)
WBC: 4 10*3/uL (ref 4.0–10.5)

## 2013-06-01 LAB — BASIC METABOLIC PANEL
BUN: 21 mg/dL (ref 6–23)
CO2: 24 mEq/L (ref 19–32)
Calcium: 7.5 mg/dL — ABNORMAL LOW (ref 8.4–10.5)
Chloride: 106 mEq/L (ref 96–112)
Creatinine, Ser: 2.27 mg/dL — ABNORMAL HIGH (ref 0.50–1.35)
GFR calc Af Amer: 43 mL/min — ABNORMAL LOW (ref >90)
GFR calc non Af Amer: 37 mL/min — ABNORMAL LOW (ref >90)
Glucose, Bld: 109 mg/dL — ABNORMAL HIGH (ref 70–99)
Potassium: 4.2 mEq/L (ref 3.7–5.3)
Sodium: 141 mEq/L (ref 137–147)

## 2013-06-01 LAB — CK: Total CK: 902 U/L — ABNORMAL HIGH (ref 7–232)

## 2013-06-01 LAB — CORTISOL: Cortisol, Plasma: 7.4 ug/dL

## 2013-06-01 MED ORDER — INSULIN GLARGINE 100 UNIT/ML ~~LOC~~ SOLN
30.0000 [IU] | Freq: Every morning | SUBCUTANEOUS | Status: DC
  Administered 2013-06-01: 30 [IU] via SUBCUTANEOUS
  Filled 2013-06-01 (×2): qty 0.3

## 2013-06-01 MED ORDER — SODIUM CHLORIDE 0.9 % IV SOLN
INTRAVENOUS | Status: DC
  Administered 2013-06-01 – 2013-06-03 (×5): via INTRAVENOUS

## 2013-06-01 MED ORDER — DEXTROSE 50 % IV SOLN
50.0000 mL | Freq: Once | INTRAVENOUS | Status: AC | PRN
  Administered 2013-06-01: 50 mL via INTRAVENOUS

## 2013-06-01 MED ORDER — DEXTROSE 50 % IV SOLN
INTRAVENOUS | Status: AC
  Filled 2013-06-01: qty 50

## 2013-06-01 MED ORDER — LORAZEPAM 2 MG/ML IJ SOLN
1.0000 mg | INTRAMUSCULAR | Status: DC | PRN
  Administered 2013-06-03: 1 mg via INTRAVENOUS
  Filled 2013-06-01: qty 1

## 2013-06-01 MED ORDER — DEXTROSE-NACL 5-0.45 % IV SOLN
INTRAVENOUS | Status: DC
  Administered 2013-06-01: 03:00:00 via INTRAVENOUS

## 2013-06-01 MED ORDER — DEXTROSE 50 % IV SOLN
INTRAVENOUS | Status: AC
  Administered 2013-06-01: 50 mL via INTRAVENOUS
  Filled 2013-06-01: qty 50

## 2013-06-01 NOTE — Progress Notes (Signed)
[facility] PROGRESS NOTE  Edward Colon [id number] DOB: 02/16/1981 DOA: 05/31/2013 PCP: [name], MD  Assessment/Plan: 1-presumed seizure disorder: EEG negative and neurology on board. -For now continue Keppra 500 mg twice a day -Plan is for MRI with contrast on 06/03/2013 -Unremarkable CT of the head -Will follow any further recommendations per neurology group. -Continue seizure precautions and a boy fluid collection on his electrolytes and blood sugar.  2-diabetes mellitus type 1 with episode of hypoglycemia: A1c 8.2 -Continue Lantus and sliding scale -Will reduce home dose Lantus to Just 30 units and avoid bedtime sliding scale insulin.  3-mild rhabdomyolysis: Continue IV fluids -Follow CK levels trend  4-acute on chronic renal failure (baseline creatinine 1.6 with stage II chronic kidney disease) -Creatinine worse this time most likely due to rhabdomyolysis and continue use of ACE inhibitor -Call ACE inhibitors -Continue IV fluids -Follow renal function trend  5-right arm swelling: Appears to be secondary to myositis. No fracture appreciated on x-ray. -Upper extremities duplex negative for blood clots -Continue physical measures (elevation, compresses, prn pain meds)  6-hypertension: Continue Coreg and PRN hydralazine  7-hyperkalemia: Secondary to use of ACE inhibitors in the setting of acute renal failure. -After Kayexalate and IV fluids potassium within normal limits -Will monitor  DVT: SCDs   Code Status: Full Family Communication: no family at bedside Disposition Plan: Home when medically stable   Consultants:  Neurology  Procedures:  EEG: No seizure activity appreciated  See below for x-ray reports  Antibiotics:  None  HPI/Subjective: Afebrile, slightly lethargic but easily arousable and able to follow commands. No further episodes of seizure activity appreciated on exam.  Objective: Filed Vitals:   06/01/13 0549  BP:  154/83  Pulse: 87  Temp: 98.2 F (36.8 C)  Resp: 16    Intake/Output Summary (Last 24 hours) at 06/01/13 0824 Last data filed at 06/01/13 [id number]  Gross per 24 hour  Intake   2580 ml  Output   1750 ml  Net    830 ml   Filed Weights   05/31/13 1413 06/01/13 0549  Weight: 106 kg (233 lb 11 oz) 99.2 kg (218 lb 11.1 oz)    Exam:   General:  No acute distress, slightly lethargic but easily arouse unable to follow commands, afebrile.  Cardiovascular: S1 and S2, no rubs, no gallops  Respiratory: Clear to auscultation bilaterally  Abdomen: Soft, nontender, nondistended, positive bowel sounds  Musculoskeletal: Right upper extremity with swelling (from his wrist to his elbow), no swelling and tender to the patient.  Data Reviewed: Basic Metabolic Panel:  Recent Labs Lab 05/31/13 1050 05/31/13 1900 06/01/13 0418  NA 134*  --  141  K 6.3* 4.7 4.2  CL 99  --  106  CO2 25  --  24  GLUCOSE 355*  --  109*  BUN 22  --  21  CREATININE 2.16*  --  2.27*  CALCIUM 8.6  --  7.5*   Liver Function Tests:  Recent Labs Lab 05/31/13 1050  AST 38*  ALT 17  ALKPHOS 110  BILITOT 0.5  PROT 6.5  ALBUMIN 2.1*    Recent Labs Lab 05/31/13 1050  LIPASE 49   CBC:  Recent Labs Lab 05/31/13 1050 06/01/13 0418  WBC 6.5 4.0  NEUTROABS 4.8  --   HGB 12.8* 10.1*  HCT 37.5* 31.6*  MCV 81.2 83.8  PLT 331 280   Cardiac Enzymes:  Recent Labs Lab 05/31/13 1050 06/01/13 0418  CKTOTAL 1627* 902*  TROPONINI <0.30  --  CBG:  Recent Labs Lab 06/01/13 0249 06/01/13 0339 06/01/13 0521 06/01/13 0728 06/01/13 1156  GLUCAP 45* 95 143* 171* 134*    Recent Results (from the past 240 hour(s))  MRSA PCR SCREENING     Status: None   Collection Time    05/31/13  4:19 PM      Result Value Ref Range Status   MRSA by PCR NEGATIVE  NEGATIVE Final   Comment:            The GeneXpert MRSA Assay (FDA     approved for NASAL specimens     only), is one component of a      comprehensive MRSA colonization     surveillance program. It is not     intended to diagnose MRSA     infection nor to guide or     monitor treatment for     MRSA infections.     Studies: Dg Chest 2 View  05/31/2013   CLINICAL DATA:  Chest discomfort  EXAM: CHEST  2 VIEW  COMPARISON:  04/03/2003  FINDINGS: The heart size and mediastinal contours are within normal limits. Both lungs are clear. The visualized skeletal structures are unremarkable.  IMPRESSION: No active cardiopulmonary disease.   Electronically Signed   By: Franchot Gallo M.D.   On: 05/31/2013 10:37   Ct Head Wo Contrast  05/31/2013   CLINICAL DATA:  Right hand is cold to touch. History of seizures. Sore right side of the tongue. Altered mental status.  EXAM: CT HEAD WITHOUT CONTRAST  TECHNIQUE: Contiguous axial images were obtained from the base of the skull through the vertex without intravenous contrast.  COMPARISON:  CT HEAD W/O CM dated 04/03/2003  FINDINGS: There is no evidence of mass effect, midline shift or extra-axial fluid collections. There is no evidence of a space-occupying lesion or intracranial hemorrhage. There is no evidence of a cortical-based area of acute infarction.  The ventricles and sulci are appropriate for the patient's age. The basal cisterns are patent.  Visualized portions of the orbits are unremarkable. The visualized portions of the paranasal sinuses and mastoid air cells are unremarkable.  The osseous structures are unremarkable.  IMPRESSION: Normal CT brain without intravenous contrast.   Electronically Signed   By: Kathreen Devoid   On: 05/31/2013 10:24   Dg Hand Complete Right  05/31/2013   CLINICAL DATA:  Pain and swelling right hand since this morning, no injury, history diabetes, hypertension  EXAM: RIGHT HAND - COMPLETE 3+ VIEW  COMPARISON:  None  FINDINGS: Deformity of the fifth metacarpal likely result of old healed fracture.  Osseous mineralization normal.  Joint spaces preserved.  No acute  fracture, dislocation or bone destruction.  Small corticated ossicle identified at the base of the fifth metacarpal likely a old ununited fracture fragment.  IMPRESSION: No acute osseous abnormalities.   Electronically Signed   By: Lavonia Dana M.D.   On: 05/31/2013 11:47    Scheduled Meds: . carvedilol  6.25 mg Oral BID WC  . insulin aspart  0-15 Units Subcutaneous TID WC  . insulin glargine  30 Units Subcutaneous q morning - 10a  . levETIRAcetam  500 mg Oral BID  . sodium chloride  3 mL Intravenous Q12H   Continuous Infusions: . sodium chloride      Principal Problem:   Seizures Active Problems:   Diabetes mellitus without complication   Hypertension   Swelling of arm   ARF (acute renal failure)   Hyperkalemia  Seizure    Time spent: >30 minutes    Barton Dubois  [facility] Pager [phone]. If 7PM-7AM, please contact night-coverage at [email or web address], password [facility] 06/01/2013, 8:24 AM  LOS: 1 day

## 2013-06-01 NOTE — Progress Notes (Signed)
Pt has slept soundly today.  We have had to wake him for meals, but has excellent intake

## 2013-06-01 NOTE — Progress Notes (Signed)
Hypoglycemic Event  CBG: 27  Treatment: D50 IV 50 mL  Symptoms: tired and thirsty  Follow-up CBG: Time:0245 CBG Result:45  Possible Reasons for Event: Unknown  Comments/MD notified:paged PA started D5 1/2 NS at 75 and gave another amp of D5 will continue to monitor     Edward Colon D  Remember to initiate Hypoglycemia Order Set & complete

## 2013-06-02 DIAGNOSIS — E119 Type 2 diabetes mellitus without complications: Secondary | ICD-10-CM | POA: Diagnosis not present

## 2013-06-02 DIAGNOSIS — N179 Acute kidney failure, unspecified: Secondary | ICD-10-CM | POA: Diagnosis not present

## 2013-06-02 DIAGNOSIS — E875 Hyperkalemia: Secondary | ICD-10-CM | POA: Diagnosis not present

## 2013-06-02 DIAGNOSIS — M79609 Pain in unspecified limb: Secondary | ICD-10-CM | POA: Diagnosis not present

## 2013-06-02 LAB — GLUCOSE, CAPILLARY
GLUCOSE-CAPILLARY: 70 mg/dL (ref 70–99)
GLUCOSE-CAPILLARY: 116 mg/dL — AB (ref 70–99)
GLUCOSE-CAPILLARY: 50 mg/dL — AB (ref 70–99)
GLUCOSE-CAPILLARY: 108 mg/dL — AB (ref 70–99)
Glucose-Capillary: 140 mg/dL — ABNORMAL HIGH (ref 70–99)
Glucose-Capillary: 157 mg/dL — ABNORMAL HIGH (ref 70–99)
Glucose-Capillary: 206 mg/dL — ABNORMAL HIGH (ref 70–99)
Glucose-Capillary: 70 mg/dL (ref 70–99)
Glucose-Capillary: 97 mg/dL (ref 70–99)

## 2013-06-02 LAB — BASIC METABOLIC PANEL
BUN: 19 mg/dL (ref 6–23)
CALCIUM: 7.9 mg/dL — AB (ref 8.4–10.5)
CHLORIDE: 113 meq/L — AB (ref 96–112)
CO2: 22 meq/L (ref 19–32)
Creatinine, Ser: 2.23 mg/dL — ABNORMAL HIGH (ref 0.50–1.35)
GFR calc Af Amer: 44 mL/min — ABNORMAL LOW (ref >90)
GFR calc non Af Amer: 38 mL/min — ABNORMAL LOW (ref >90)
GLUCOSE: 108 mg/dL — AB (ref 70–99)
Potassium: 5.2 mEq/L (ref 3.7–5.3)
Sodium: 142 mEq/L (ref 137–147)

## 2013-06-02 LAB — CK: CK TOTAL: 775 U/L — AB (ref 7–232)

## 2013-06-02 MED ORDER — INSULIN GLARGINE 100 UNIT/ML ~~LOC~~ SOLN
25.0000 [IU] | Freq: Every morning | SUBCUTANEOUS | Status: DC
  Administered 2013-06-02 – 2013-06-04 (×3): 25 [IU] via SUBCUTANEOUS
  Filled 2013-06-02 (×3): qty 0.25

## 2013-06-02 MED ORDER — INSULIN ASPART 100 UNIT/ML ~~LOC~~ SOLN
0.0000 [IU] | Freq: Three times a day (TID) | SUBCUTANEOUS | Status: DC
  Administered 2013-06-02: 2 [IU] via SUBCUTANEOUS
  Administered 2013-06-02: 3 [IU] via SUBCUTANEOUS
  Administered 2013-06-02: 1 [IU] via SUBCUTANEOUS
  Administered 2013-06-03: 3 [IU] via SUBCUTANEOUS
  Administered 2013-06-03: 1 [IU] via SUBCUTANEOUS
  Administered 2013-06-03: 3 [IU] via SUBCUTANEOUS
  Administered 2013-06-04: 9 [IU] via SUBCUTANEOUS

## 2013-06-02 NOTE — Progress Notes (Signed)
[facility] PROGRESS NOTE  Edward Colon [id number] DOB: [birth year] DOA: 05/31/2013 PCP: [name], MD  Assessment/Plan: 1-presumed seizure disorder: EEG negative and neurology on board. -Continue Keppra 500 mg twice a day -Plan is for MRI with contrast on 06/03/2013 (if kidney function allows it) -Unremarkable CT of the head -Will follow any further recommendations per neurology group. -Continue seizure precautions and repletion of his electrolytes and control of blood sugar (avoiding hypoglycemic events).  2-diabetes mellitus type 1 with episode of hypoglycemia: A1c 8.2 -Continue Lantus (change to 25 units) and sliding scale (changed to sensitive)  3-mild rhabdomyolysis: Continue IV fluids -Follow CK levels trend  4-acute on chronic renal failure (baseline creatinine 1.6 (on Sep 2014), with stage II chronic kidney disease) -Creatinine worse this time most likely due to rhabdomyolysis and continue use of ACE inhibitor -continue holding nephrotoxic agents -Continue IV fluids -Follow renal function trend  5-right arm swelling: Appears to be secondary to myositis. No fracture appreciated on x-ray. -Upper extremities duplex negative for blood clots -Continue physical measures (elevation, compresses, prn pain meds)  6-hypertension: Continue Coreg and PRN hydralazine; fair controlled.  7-hyperkalemia: Secondary to use of ACE inhibitors in the setting of acute renal failure. -After Kayexalate and IV fluids potassium within normal limits -Will continue to monitor  DVT: SCDs   Code Status: Full Family Communication: no family at bedside Disposition Plan: Home when medically stable   Consultants:  Neurology  Procedures:  EEG: No seizure activity appreciated  See below for x-ray reports  Antibiotics:  None  HPI/Subjective: Afebrile,AAOX3, no acute distress. Continue complaining of pain on his RUE. Also with swelling on his LE  bilaterally.  Objective: Filed Vitals:   06/02/13 0500  BP: 150/82  Pulse: 80  Temp: 98.7 F (37.1 C)  Resp: 14    Intake/Output Summary (Last 24 hours) at 06/02/13 [id number] Last data filed at 06/02/13 0700  Gross per 24 hour  Intake   2690 ml  Output   1975 ml  Net    715 ml   Filed Weights   05/31/13 1413 06/01/13 0549 06/02/13 0500  Weight: 106 kg (233 lb 11 oz) 99.2 kg (218 lb 11.1 oz) 102.5 kg (225 lb 15.5 oz)    Exam:   General:  No acute distress, feeling better and AAOX3 today; afebrile  Cardiovascular: S1 and S2, no rubs, no gallops  Respiratory: Clear to auscultation bilaterally  Abdomen: Soft, nontender, nondistended, positive bowel sounds  Musculoskeletal: Right upper extremity with swelling (from his wrist to his elbow), no erythema and tender to palpation, patient also with LE edema 1-2++.  Data Reviewed: Basic Metabolic Panel:  Recent Labs Lab 05/31/13 1050 05/31/13 1900 06/01/13 0418 06/02/13 0401  NA 134*  --  141 142  K 6.3* 4.7 4.2 5.2  CL 99  --  106 113*  CO2 25  --  24 22  GLUCOSE 355*  --  109* 108*  BUN 22  --  21 19  CREATININE 2.16*  --  2.27* 2.23*  CALCIUM 8.6  --  7.5* 7.9*   Liver Function Tests:  Recent Labs Lab 05/31/13 1050  AST 38*  ALT 17  ALKPHOS 110  BILITOT 0.5  PROT 6.5  ALBUMIN 2.1*    Recent Labs Lab 05/31/13 1050  LIPASE 49   CBC:  Recent Labs Lab 05/31/13 1050 06/01/13 0418  WBC 6.5 4.0  NEUTROABS 4.8  --   HGB 12.8* 10.1*  HCT 37.5* 31.6*  MCV 81.2 83.8  PLT 331 280   Cardiac Enzymes:  Recent Labs Lab 05/31/13 1050 06/01/13 0418 06/02/13 0401  CKTOTAL [id number]* 902* 775*  TROPONINI <0.30  --   --    CBG:  Recent Labs Lab 06/02/13 0045 06/02/13 0215 06/02/13 0607 06/02/13 0643 06/02/13 0736  GLUCAP 70 116* 50* 108* 140*    Recent Results (from the past 240 hour(s))  MRSA PCR SCREENING     Status: None   Collection Time    05/31/13  4:19 PM      Result Value Ref Range  Status   MRSA by PCR NEGATIVE  NEGATIVE Final   Comment:            The GeneXpert MRSA Assay (FDA     approved for NASAL specimens     only), is one component of a     comprehensive MRSA colonization     surveillance program. It is not     intended to diagnose MRSA     infection nor to guide or     monitor treatment for     MRSA infections.     Studies: Dg Chest 2 View  05/31/2013   CLINICAL DATA:  Chest discomfort  EXAM: CHEST  2 VIEW  COMPARISON:  04/03/2003  FINDINGS: The heart size and mediastinal contours are within normal limits. Both lungs are clear. The visualized skeletal structures are unremarkable.  IMPRESSION: No active cardiopulmonary disease.   Electronically Signed   By: Franchot Gallo M.D.   On: 05/31/2013 10:37   Ct Head Wo Contrast  05/31/2013   CLINICAL DATA:  Right hand is cold to touch. History of seizures. Sore right side of the tongue. Altered mental status.  EXAM: CT HEAD WITHOUT CONTRAST  TECHNIQUE: Contiguous axial images were obtained from the base of the skull through the vertex without intravenous contrast.  COMPARISON:  CT HEAD W/O CM dated 04/03/2003  FINDINGS: There is no evidence of mass effect, midline shift or extra-axial fluid collections. There is no evidence of a space-occupying lesion or intracranial hemorrhage. There is no evidence of a cortical-based area of acute infarction.  The ventricles and sulci are appropriate for the patient's age. The basal cisterns are patent.  Visualized portions of the orbits are unremarkable. The visualized portions of the paranasal sinuses and mastoid air cells are unremarkable.  The osseous structures are unremarkable.  IMPRESSION: Normal CT brain without intravenous contrast.   Electronically Signed   By: Kathreen Devoid   On: 05/31/2013 10:24   Dg Hand Complete Right  05/31/2013   CLINICAL DATA:  Pain and swelling right hand since this morning, no injury, history diabetes, hypertension  EXAM: RIGHT HAND - COMPLETE 3+ VIEW   COMPARISON:  None  FINDINGS: Deformity of the fifth metacarpal likely result of old healed fracture.  Osseous mineralization normal.  Joint spaces preserved.  No acute fracture, dislocation or bone destruction.  Small corticated ossicle identified at the base of the fifth metacarpal likely a old ununited fracture fragment.  IMPRESSION: No acute osseous abnormalities.   Electronically Signed   By: Lavonia Dana M.D.   On: 05/31/2013 11:47    Scheduled Meds: . carvedilol  6.25 mg Oral BID WC  . insulin aspart  0-9 Units Subcutaneous TID WC  . insulin glargine  25 Units Subcutaneous q morning - 10a  . levETIRAcetam  500 mg Oral BID  . sodium chloride  3 mL Intravenous Q12H   Continuous Infusions: . sodium chloride 100 mL/hr at 06/02/13  0620    Principal Problem:   Seizures Active Problems:   Diabetes mellitus without complication   Hypertension   Swelling of arm   ARF (acute renal failure)   Hyperkalemia   Seizure    Time spent: >30 minutes    Barton Dubois  [facility] Pager [phone]. If 7PM-7AM, please contact night-coverage at [email or web address], password [facility] 06/02/2013, 9:27 AM  LOS: 2 days

## 2013-06-02 NOTE — Progress Notes (Signed)
Pt's HS CBG 70, given bedtime snack- graham crackers, peanut butter, and ginger ale. Rechecked CBG again at midnight CBG 70 given 15 gram snack. Patient states he feels fine. Jonette Eva, NP updated. CBG checked at 0200, CBG 116.  Will continue to monitor. Wendee Beavers [place]

## 2013-06-02 NOTE — Progress Notes (Signed)
Hypoglycemic Event  CBG: 50  Treatment: 15 GM carbohydrate snack  Symptoms: None  Follow-up CBG: [id number] CBG Result:108  Possible Reasons for Event: Unknown  Comments/MD notified:M. Donnal Debar, NP    Wendee Beavers [place]  Remember to initiate Hypoglycemia Order Set & complete

## 2013-06-02 NOTE — Progress Notes (Signed)
Much more awake today, ambulated pt to bathroom w/walker, still very sore all over.  Sitting on side of bed now bathing.

## 2013-06-03 ENCOUNTER — Inpatient Hospital Stay (HOSPITAL_COMMUNITY): Payer: Medicare Other

## 2013-06-03 DIAGNOSIS — N179 Acute kidney failure, unspecified: Secondary | ICD-10-CM | POA: Diagnosis not present

## 2013-06-03 DIAGNOSIS — I369 Nonrheumatic tricuspid valve disorder, unspecified: Secondary | ICD-10-CM

## 2013-06-03 DIAGNOSIS — I5022 Chronic systolic (congestive) heart failure: Secondary | ICD-10-CM

## 2013-06-03 DIAGNOSIS — E119 Type 2 diabetes mellitus without complications: Secondary | ICD-10-CM | POA: Diagnosis not present

## 2013-06-03 DIAGNOSIS — H539 Unspecified visual disturbance: Secondary | ICD-10-CM | POA: Diagnosis not present

## 2013-06-03 DIAGNOSIS — E875 Hyperkalemia: Secondary | ICD-10-CM | POA: Diagnosis not present

## 2013-06-03 DIAGNOSIS — I10 Essential (primary) hypertension: Secondary | ICD-10-CM | POA: Diagnosis not present

## 2013-06-03 LAB — BASIC METABOLIC PANEL
BUN: 16 mg/dL (ref 6–23)
CALCIUM: 8 mg/dL — AB (ref 8.4–10.5)
CO2: 23 meq/L (ref 19–32)
CREATININE: 2.14 mg/dL — AB (ref 0.50–1.35)
Chloride: 110 mEq/L (ref 96–112)
GFR calc Af Amer: 46 mL/min — ABNORMAL LOW (ref >90)
GFR calc non Af Amer: 40 mL/min — ABNORMAL LOW (ref >90)
Glucose, Bld: 124 mg/dL — ABNORMAL HIGH (ref 70–99)
Potassium: 4.8 mEq/L (ref 3.7–5.3)
Sodium: 141 mEq/L (ref 137–147)

## 2013-06-03 LAB — GLUCOSE, CAPILLARY
GLUCOSE-CAPILLARY: 234 mg/dL — AB (ref 70–99)
GLUCOSE-CAPILLARY: 272 mg/dL — AB (ref 70–99)
Glucose-Capillary: 127 mg/dL — ABNORMAL HIGH (ref 70–99)
Glucose-Capillary: 88 mg/dL (ref 70–99)
Glucose-Capillary: 131 mg/dL — ABNORMAL HIGH (ref 70–99)
Glucose-Capillary: 214 mg/dL — ABNORMAL HIGH (ref 70–99)

## 2013-06-03 LAB — MYOGLOBIN, URINE: MYOGLOBIN UR: 1880 ug/L — AB (ref <28)

## 2013-06-03 MED ORDER — ISOSORB DINITRATE-HYDRALAZINE 20-37.5 MG PO TABS
1.0000 | ORAL_TABLET | Freq: Two times a day (BID) | ORAL | Status: DC
  Administered 2013-06-03 – 2013-06-04 (×2): 1 via ORAL
  Filled 2013-06-03 (×3): qty 1

## 2013-06-03 MED ORDER — FUROSEMIDE 20 MG PO TABS
20.0000 mg | ORAL_TABLET | Freq: Every day | ORAL | Status: DC
  Administered 2013-06-03 – 2013-06-04 (×2): 20 mg via ORAL
  Filled 2013-06-03 (×2): qty 1

## 2013-06-03 MED ORDER — GADOBENATE DIMEGLUMINE 529 MG/ML IV SOLN
20.0000 mL | Freq: Once | INTRAVENOUS | Status: AC | PRN
  Administered 2013-06-03: 20 mL via INTRAVENOUS

## 2013-06-03 NOTE — Progress Notes (Signed)
[facility] PROGRESS NOTE  Edward Colon [id number] DOB: 05-19-1980 DOA: 05/31/2013 PCP: Edward Fendt, MD  Assessment/Plan: 1-presumed seizure disorder: EEG negative and neurology on board. -Continue Keppra 500 mg twice a day -Unremarkable CT of the head and negative MRI -per neurology plan is to continue keppra and to follow with neurology as an outpatient in 3 weeks or so. -Continue seizure precautions and repletion of his electrolytes and control of blood sugar (avoiding hypoglycemic events).  2-diabetes mellitus type 1 with episode of hypoglycemia: A1c 8.2 -Continue Lantus (change to 25 units) and sliding scale (changed to sensitive)  3-mild rhabdomyolysis: Continue IV fluids -Follow CK levels trend  4-acute on chronic renal failure (baseline creatinine 1.6 (on Sep 2014), with stage II chronic kidney disease) -Creatinine worse this time most likely due to rhabdomyolysis and continue use of ACE inhibitor -continue holding ACE for now -Change IVF's no NSL -Follow renal function in am -will add low dose lasix to help with perfusion  5-right arm swelling: Appears to be secondary to myositis. No fracture appreciated on x-ray. -Upper extremities duplex negative for blood clots -Continue physical measures (elevation, compresses, prn pain meds)  6-hypertension: Continue Coreg, bidil and lasix -will follow response and continue adjusting as needed  7-hyperkalemia: Secondary to use of ACE inhibitors in the setting of acute renal failure. -After Kayexalate and IV fluids potassium within normal limits -Will continue to monitor  8-chronic systolic heart failure. EF 45-50% -continue low sodium diet -will start low dose lasix to help controlling volume -continue B-blocker -ACE on hold currently due to acute on chronic renal failure.  DVT: SCDs   Code Status: Full Family Communication: no family at bedside Disposition Plan: Home when medically  stable   Consultants:  Neurology  Procedures:  EEG: No seizure activity appreciated  2-D echo: diffuse hypokinesis, 45-50% EF; normal diastolic dysfunction  See below for x-ray reports  Antibiotics:  None  HPI/Subjective: Afebrile,AAOX3, no acute distress.   Objective: Filed Vitals:   06/03/13 1424  BP: 188/97  Pulse: 76  Temp:   Resp:     Intake/Output Summary (Last 24 hours) at 06/03/13 1453 Last data filed at 06/03/13 1300  Gross per 24 hour  Intake 1736.67 ml  Output   1875 ml  Net -138.33 ml   Filed Weights   06/01/13 0549 06/02/13 0500 06/03/13 0500  Weight: 99.2 kg (218 lb 11.1 oz) 102.5 kg (225 lb 15.5 oz) 101.9 kg (224 lb 10.4 oz)    Exam:   General:  No acute distress, feeling better and AAOX3 today; afebrile  Cardiovascular: S1 and S2, no rubs, no gallops  Respiratory: Clear to auscultation bilaterally  Abdomen: Soft, nontender, nondistended, positive bowel sounds  Musculoskeletal: Right upper extremity with swelling (from his wrist to his elbow), no erythema and tender to palpation, patient also with LE edema 1-2++ bilaterally.  Data Reviewed: Basic Metabolic Panel:  Recent Labs Lab 05/31/13 1050 05/31/13 1900 06/01/13 0418 06/02/13 0401 06/03/13 0330  NA 134*  --  141 142 141  K 6.3* 4.7 4.2 5.2 4.8  CL 99  --  106 113* 110  CO2 25  --  24 22 23   GLUCOSE 355*  --  109* 108* 124*  BUN 22  --  21 19 16   CREATININE 2.16*  --  2.27* 2.23* 2.14*  CALCIUM 8.6  --  7.5* 7.9* 8.0*   Liver Function Tests:  Recent Labs Lab 05/31/13 1050  AST 38*  ALT 17  ALKPHOS 110  BILITOT 0.5  PROT 6.5  ALBUMIN 2.1*    Recent Labs Lab 05/31/13 1050  LIPASE 49   CBC:  Recent Labs Lab 05/31/13 1050 06/01/13 0418  WBC 6.5 4.0  NEUTROABS 4.8  --   HGB 12.8* 10.1*  HCT 37.5* 31.6*  MCV 81.2 83.8  PLT 331 280   Cardiac Enzymes:  Recent Labs Lab 05/31/13 1050 06/01/13 0418 06/02/13 0401  CKTOTAL [id number]* 902* 775*  TROPONINI  <0.30  --   --    CBG:  Recent Labs Lab 06/02/13 2224 06-29-13 0203 06/29/2013 0557 2013-06-29 0719 06/29/2013 1303  GLUCAP 97 127* 88 131* 234*    Recent Results (from the past 240 hour(s))  MRSA PCR SCREENING     Status: None   Collection Time    05/31/13  4:19 PM      Result Value Ref Range Status   MRSA by PCR NEGATIVE  NEGATIVE Final   Comment:            The GeneXpert MRSA Assay (FDA     approved for NASAL specimens     only), is one component of a     comprehensive MRSA colonization     surveillance program. It is not     intended to diagnose MRSA     infection nor to guide or     monitor treatment for     MRSA infections.     Studies: Mr [name] Contrast  29-Jun-2013   CLINICAL DATA:  Seizure.  Visual disturbance.  EXAM: MRI HEAD WITHOUT AND WITH CONTRAST  TECHNIQUE: Multiplanar, multiecho pulse sequences of the brain and surrounding structures were obtained without and with intravenous contrast.  CONTRAST:  22mL MULTIHANCE GADOBENATE DIMEGLUMINE 529 MG/ML IV SOLN  COMPARISON:  Head CT 05/31/2013  FINDINGS: The brain has a normal appearance on all pulse sequences without evidence of malformation, atrophy, old or acute infarction, mass lesion, hemorrhage, hydrocephalus or extra-axial collection. No pituitary mass. No fluid in the sinuses, middle ears or mastoids. Minimal mucosal thickening of the paranasal sinuses. No skull or skullbase lesion. There is flow in the major vessels at the base of the brain. Major venous sinuses show flow. No abnormal contrast enhancement. Mesial temporal lobes appear normal.  IMPRESSION: Normal examination.   Electronically Signed   By: Nelson Chimes M.D.   On: June 29, 2013 13:22    Scheduled Meds: . carvedilol  6.25 mg Oral BID WC  . furosemide  20 mg Oral Daily  . insulin aspart  0-9 Units Subcutaneous TID WC  . insulin glargine  25 Units Subcutaneous q morning - 10a  . isosorbide-hydrALAZINE  1 tablet Oral BID  . levETIRAcetam  500 mg Oral  BID  . sodium chloride  3 mL Intravenous Q12H   Continuous Infusions:    Principal Problem:   Seizures Active Problems:   Diabetes mellitus without complication   Hypertension   Swelling of arm   ARF (acute renal failure)   Hyperkalemia   Seizure    Time spent: >30 minutes    Barton Dubois  [facility] Pager [phone]. If 7PM-7AM, please contact night-coverage at [email or web address], password [facility] 29-Jun-2013, 2:53 PM  LOS: 3 days

## 2013-06-03 NOTE — Progress Notes (Signed)
Echo Lab  2D Echocardiogram completed.  [place], [place] 06/03/2013 12:55 PM

## 2013-06-04 DIAGNOSIS — I5022 Chronic systolic (congestive) heart failure: Secondary | ICD-10-CM | POA: Diagnosis not present

## 2013-06-04 DIAGNOSIS — N179 Acute kidney failure, unspecified: Secondary | ICD-10-CM | POA: Diagnosis not present

## 2013-06-04 DIAGNOSIS — E875 Hyperkalemia: Secondary | ICD-10-CM | POA: Diagnosis not present

## 2013-06-04 DIAGNOSIS — E119 Type 2 diabetes mellitus without complications: Secondary | ICD-10-CM | POA: Diagnosis not present

## 2013-06-04 LAB — BASIC METABOLIC PANEL
BUN: 19 mg/dL (ref 6–23)
CHLORIDE: 111 meq/L (ref 96–112)
CO2: 23 mEq/L (ref 19–32)
Calcium: 8 mg/dL — ABNORMAL LOW (ref 8.4–10.5)
Creatinine, Ser: 2.21 mg/dL — ABNORMAL HIGH (ref 0.50–1.35)
GFR calc Af Amer: 44 mL/min — ABNORMAL LOW (ref >90)
GFR calc non Af Amer: 38 mL/min — ABNORMAL LOW (ref >90)
Glucose, Bld: 145 mg/dL — ABNORMAL HIGH (ref 70–99)
Potassium: 4.8 mEq/L (ref 3.7–5.3)
Sodium: 140 mEq/L (ref 137–147)

## 2013-06-04 LAB — GLUCOSE, CAPILLARY
Glucose-Capillary: 67 mg/dL — ABNORMAL LOW (ref 70–99)
Glucose-Capillary: 147 mg/dL — ABNORMAL HIGH (ref 70–99)
Glucose-Capillary: 379 mg/dL — ABNORMAL HIGH (ref 70–99)

## 2013-06-04 LAB — CK: CK TOTAL: 450 U/L — AB (ref 7–232)

## 2013-06-04 MED ORDER — FUROSEMIDE 20 MG PO TABS: 20.0000 mg | ORAL_TABLET | Freq: Every day | ORAL | Status: DC

## 2013-06-04 MED ORDER — ISOSORB DINITRATE-HYDRALAZINE 20-37.5 MG PO TABS: 1.0000 | ORAL_TABLET | Freq: Two times a day (BID) | ORAL | Status: DC

## 2013-06-04 MED ORDER — INSULIN GLARGINE 100 UNIT/ML ~~LOC~~ SOLN: 30.0000 [IU] | Freq: Every morning | SUBCUTANEOUS | Status: DC

## 2013-06-04 MED ORDER — LEVETIRACETAM 500 MG PO TABS: 500.0000 mg | ORAL_TABLET | Freq: Two times a day (BID) | ORAL | Status: DC

## 2013-06-04 MED ORDER — CARVEDILOL 12.5 MG PO TABS: 12.5000 mg | ORAL_TABLET | Freq: Two times a day (BID) | ORAL | Status: DC

## 2013-06-04 MED ORDER — LISINOPRIL 40 MG PO TABS: 40.0000 mg | ORAL_TABLET | Freq: Every day | ORAL | Status: DC

## 2013-06-04 NOTE — Discharge Summary (Signed)
Physician Discharge Summary  Edward Colon [id number] DOB: 1980/12/25 DOA: 05/31/2013  PCP: [name], MD  Admit date: 05/31/2013 Discharge date: 06/04/2013  Time spent: >30 minutes  Recommendations for Outpatient Follow-up:  BMET to follow electrolytes and renal function Reassess BP and adjust medications as needed Patient to follow with cardiology for further outpatient work up given mild decrease on EF and diffuse hypokinesis Arrange follow up appointment with renal service Close follow up to CBG's and diabetes; please adjust hypoglycemic regimen while avoiding low sugars  Discharge Diagnoses:  Presumed Seizures Diabetes mellitus without complication Hypertension Swelling of arm Acute on chronic renal failure Hyperkalemia Chronic systolic heart failure/hypertensive cardiomyopathy   Discharge Condition: stable and improved. Discharge home with appointment to follow with neurology, cardiology and PCP as an outpatient.  Diet recommendation: low sodium and low carbohydrate sdiet  Filed Weights   06/02/13 0500 06/03/13 0500 06/04/13 0644  Weight: 102.5 kg (225 lb 15.5 oz) 101.9 kg (224 lb 10.4 oz) 99.927 kg (220 lb 4.8 oz)    History of present illness:  Edward Colon is a 33 y.o. male, with history of seizures not on any medications last seizure few months ago, hypertension, diabetes mellitus type 2 who was in parking lot in his car and had a seizure-like episode Wednesday night, he does not remember what happened since then, he was subsequently found this morning which is over 24 hours later in his car in a drowsy state, EMS then into the ER.  In the ER he was found to have some tongue bites, his workup was consistent with breakthrough seizure, acute renal failure, hyperkalemia, poorly controlled diabetes mellitus, head CT was unremarkable, patient says that his right hand was stuck under his car seat for a day and is only subjective complaints right now and  generalized weakness and right hand pain, right hand x-ray was stable, he was not in DKA and I was called to admit the patient.  Patient denies any fever chills, mild cough, no chest abdominal pain, mild right hand ache/pain, says it is getting better, mild generalized headache, no photophobia, no neck stiffness, no abdominal pain or diarrhea. Denies any alcohol abuse, smoking occasional some marijuana, no other illicit drugs.   Hospital Course:  1-presumed seizure disorder: EEG negative this time.  -Continue Keppra 500 mg twice a day  -Unremarkable CT of the head and negative MRI  -per neurology plan is to continue keppra and to follow with neurology as an outpatient in 3 weeks or so.  -Continue seizure precautions and repletion of his electrolytes and control of blood sugar (avoiding hypoglycemic events).   2-diabetes mellitus type 1 with episode of hypoglycemia: A1c 8.2  -Continue Lantus (change to 30 units) and sliding scale (changed to sensitive)  -given worsening renal function and episodes of hypoglycemia with previous lantus dose -outpatient follow up for further adjustments to hypoglycemic regimen -advise to follow low carbohydrates diet  3-mild rhabdomyolysis: Continue IV fluids  -CK level sin the low 400 range at discharge. -advise to maintain good hydration  4-acute on chronic renal failure (baseline creatinine 1.6 (on Sep 2014), with stage II-III chronic kidney disease)  -Creatinine worse this time most likely due to mild rhabdomyolysis and continue use of ACE inhibitor  -continue holding ACE for now  -advise to maintain good hydration and to follow low sodium diet -Follow renal function as an outpatient and arrange for nephrology outpatient visit  5-right arm swelling: Appears to be secondary to myositis. No fracture appreciated on  x-ray.  -Upper extremities duplex negative for blood clots  -Continue physical measures (elevation, compresses, prn pain meds) -low dose lasix  will help with swelling as well   6-hypertension: Continue Coreg, bidil and lasix now -will follow BP as an outpatient and continue adjusting as needed  -advise to follow low sodium diet  7-hyperkalemia: Secondary to use of ACE inhibitors in the setting of acute renal failure.  -After Kayexalate, holding period of ACE and IV fluids, potassium within normal limits  -Will required BMET to follow electrolytes as an outpatient  8-chronic systolic heart failure/hypertensive cardiomyopathy: EF 45-50%  -continue low sodium diet  -will start low dose lasix to help controlling volume  -continue B-blocker  -ACE on hold currently due to acute on chronic renal failure; to be resume in outpatient setting base on renal function stability. -most likely due to HTN -outpatient follow up with cardiology for further evaluation and treatment.   Procedures:  EEG: No seizure activity appreciated   2-D echo: diffuse hypokinesis, 45-50% EF; normal diastolic dysfunction   Right Upper extremity duplex: no DVT  See below for x-ray reports  Consultations:  Neurology  Cardiology (Dr. Marlou Porch; plan is for outpatient work up)  Discharge Exam: Filed Vitals:   06/04/13 0644  BP: 168/93  Pulse: 70  Temp: 98.2 F (36.8 C)  Resp: 20   General: No acute distress, feeling better and AAOX3 today; afebrile  Cardiovascular: S1 and S2, no rubs, no gallops  Respiratory: Clear to auscultation bilaterally  Abdomen: Soft, nontender, nondistended, positive bowel sounds  Musculoskeletal: Right upper extremity with swelling (from his wrist to his elbow), no erythema and tender to palpation, patient also with LE edema 1++ bilaterally.   Discharge Instructions  Discharge Orders   Future Appointments Provider Department Dept Phone   06/18/2013 2:00 PM Cameron Sprang, MD [facility] Neurology [facility] [phone]   Future Orders Complete By Expires   Diet - low sodium heart healthy  As directed    Discharge  instructions  As directed    Comments:     TAKE MEDICATIONS AS PRESCRIBED STOP LISINOPRIL UNTIL FOLLOW UP WITH PRIMARY CARE PROVIDER FOLLOW A LOW SODIUM DIET (LESS THAN 2 GRAM DAILY) ARRANGE FOLLOW UP WITH PCP IN 1 WEEK FOLLOW UP WITH NEUROLOGY IN 3 WEEKS CARDIOLOGY SERVICE WILL CONTACT YOU WITH APPOINTMENT DETAILS FOR FOLLOW UP.       Medication List         carvedilol 12.5 MG tablet  Commonly known as:  COREG  Take 1 tablet (12.5 mg total) by mouth 2 (two) times daily with a meal.     furosemide 20 MG tablet  Commonly known as:  LASIX  Take 1 tablet (20 mg total) by mouth daily.     insulin glargine 100 UNIT/ML injection  Commonly known as:  LANTUS  Inject 0.3 mLs (30 Units total) into the skin every morning.     insulin lispro 100 UNIT/ML injection  Commonly known as:  HUMALOG  Inject 2-10 Units into the skin 3 (three) times daily before meals.     isosorbide-hydrALAZINE 20-37.5 MG per tablet  Commonly known as:  BIDIL  Take 1 tablet by mouth 2 (two) times daily.     levETIRAcetam 500 MG tablet  Commonly known as:  KEPPRA  Take 1 tablet (500 mg total) by mouth 2 (two) times daily.     lisinopril 40 MG tablet  Commonly known as:  PRINIVIL,ZESTRIL  Take 1 tablet (40 mg total) by mouth daily.  HOLD UNTIL FOLLOW UP WITH PRIMARY CARE PHYSICIAN       No Known Allergies     Follow-up Information   Follow up with AVBUERE,EDWIN A, MD. Schedule an appointment as soon as possible for a visit in 1 week.   Specialty:  Internal Medicine   Contact information:   [place] [address] [phone]       Follow up with Cameron Sprang, MD On 06/18/2013. (AT 1:30 pm)    Specialty:  Neurology   Contact information:   [address] [address] [phone]       Follow up with [facility]. (call at your convenience to setup appointment)    Contact information:   [address] [place] [address] [phone]       Follow up with [facility]. (office will call you with appointment details for follow up)    Contact information:   [place] [address] [phone]      The results of significant diagnostics from this hospitalization (including imaging, microbiology, ancillary and laboratory) are listed below for reference.    Significant Diagnostic Studies: Dg Chest 2 View  05/31/2013   CLINICAL DATA:  Chest discomfort  EXAM: CHEST  2 VIEW  COMPARISON:  04/03/2003  FINDINGS: The heart size and mediastinal contours are within normal limits. Both lungs are clear. The visualized skeletal structures are unremarkable.  IMPRESSION: No active cardiopulmonary disease.   Electronically Signed   By: Franchot Gallo M.D.   On: 05/31/2013 10:37   Ct Head Wo Contrast  05/31/2013   CLINICAL DATA:  Right hand is cold to touch. History of seizures. Sore right side of the tongue. Altered mental status.  EXAM: CT HEAD WITHOUT CONTRAST  TECHNIQUE: Contiguous axial images were obtained from the base of the skull through the vertex without intravenous contrast.  COMPARISON:  CT HEAD W/O CM dated 04/03/2003  FINDINGS: There is no evidence of mass effect, midline shift or extra-axial fluid collections. There is no evidence of a space-occupying lesion or intracranial hemorrhage. There is no evidence of a cortical-based area of acute infarction.  The ventricles and sulci are appropriate for the patient's age. The basal cisterns are patent.  Visualized portions of the orbits are unremarkable. The visualized portions of the paranasal sinuses and mastoid air cells are unremarkable.  The osseous structures are unremarkable.  IMPRESSION: Normal CT brain without intravenous contrast.   Electronically Signed   By: Kathreen Devoid   On: 05/31/2013 10:24   Mr Brain W Wo Contrast  06/03/2013   CLINICAL DATA:  Seizure.  Visual disturbance.  EXAM: MRI HEAD WITHOUT AND WITH  CONTRAST  TECHNIQUE: Multiplanar, multiecho pulse sequences of the brain and surrounding structures were obtained without and with intravenous contrast.  CONTRAST:  22mL MULTIHANCE GADOBENATE DIMEGLUMINE 529 MG/ML IV SOLN  COMPARISON:  Head CT 05/31/2013  FINDINGS: The brain has a normal appearance on all pulse sequences without evidence of malformation, atrophy, old or acute infarction, mass lesion, hemorrhage, hydrocephalus or extra-axial collection. No pituitary mass. No fluid in the sinuses, middle ears or mastoids. Minimal mucosal thickening of the paranasal sinuses. No skull or skullbase lesion. There is flow in the major vessels at the base of the brain. Major venous sinuses show flow. No abnormal contrast enhancement. Mesial temporal lobes appear normal.  IMPRESSION: Normal examination.   Electronically Signed   By: Jan Fireman.D.  On: 06/03/2013 13:22   Dg Hand Complete Right  05/31/2013   CLINICAL DATA:  Pain and swelling right hand since this morning, no injury, history diabetes, hypertension  EXAM: RIGHT HAND - COMPLETE 3+ VIEW  COMPARISON:  None  FINDINGS: Deformity of the fifth metacarpal likely result of old healed fracture.  Osseous mineralization normal.  Joint spaces preserved.  No acute fracture, dislocation or bone destruction.  Small corticated ossicle identified at the base of the fifth metacarpal likely a old ununited fracture fragment.  IMPRESSION: No acute osseous abnormalities.   Electronically Signed   By: Lavonia Dana M.D.   On: 05/31/2013 11:47    Microbiology: Recent Results (from the past 240 hour(s))  MRSA PCR SCREENING     Status: None   Collection Time    05/31/13  4:19 PM      Result Value Ref Range Status   MRSA by PCR NEGATIVE  NEGATIVE Final   Comment:            The GeneXpert MRSA Assay (FDA     approved for NASAL specimens     only), is one component of a     comprehensive MRSA colonization     surveillance program. It is not     intended to diagnose MRSA      infection nor to guide or     monitor treatment for     MRSA infections.     Labs: Basic Metabolic Panel:  Recent Labs Lab 05/31/13 1050 05/31/13 1900 06/01/13 0418 06/02/13 0401 06/03/13 0330 06/04/13 0345  NA 134*  --  141 142 141 140  K 6.3* 4.7 4.2 5.2 4.8 4.8  CL 99  --  106 113* 110 111  CO2 25  --  24 22 23 23   GLUCOSE 355*  --  109* 108* 124* 145*  BUN 22  --  21 19 16 19   CREATININE 2.16*  --  2.27* 2.23* 2.14* 2.21*  CALCIUM 8.6  --  7.5* 7.9* 8.0* 8.0*   Liver Function Tests:  Recent Labs Lab 05/31/13 1050  AST 38*  ALT 17  ALKPHOS 110  BILITOT 0.5  PROT 6.5  ALBUMIN 2.1*    Recent Labs Lab 05/31/13 1050  LIPASE 49   CBC:  Recent Labs Lab 05/31/13 1050 06/01/13 0418  WBC 6.5 4.0  NEUTROABS 4.8  --   HGB 12.8* 10.1*  HCT 37.5* 31.6*  MCV 81.2 83.8  PLT 331 280   Cardiac Enzymes:  Recent Labs Lab 05/31/13 1050 06/01/13 0418 06/02/13 0401 06/04/13 0345  CKTOTAL 1627* 902* 775* 450*  TROPONINI <0.30  --   --   --    CBG:  Recent Labs Lab 06/03/13 1709 06/03/13 2134 06/04/13 0725 06/04/13 0811 06/04/13 1228  GLUCAP 214* 272* 67* 147* 379*    Signed:  Barton Dubois  [facility] 06/04/2013, 1:29 PM

## 2013-06-18 ENCOUNTER — Encounter: Payer: Self-pay | Admitting: Neurology

## 2013-06-18 ENCOUNTER — Ambulatory Visit: Payer: Medicare Other | Admitting: Neurology

## 2013-06-21 ENCOUNTER — Encounter: Payer: Medicare Other | Admitting: Cardiology

## 2013-07-18 ENCOUNTER — Emergency Department (HOSPITAL_COMMUNITY)
Admission: EM | Admit: 2013-07-18 | Discharge: 2013-07-18 | Disposition: A | Discharge status: Home / Self Care | Payer: Medicare Other | Attending: Emergency Medicine | Admitting: Emergency Medicine

## 2013-07-18 ENCOUNTER — Encounter (HOSPITAL_COMMUNITY): Payer: Self-pay | Admitting: Emergency Medicine

## 2013-07-18 DIAGNOSIS — E162 Hypoglycemia, unspecified: Secondary | ICD-10-CM

## 2013-07-18 DIAGNOSIS — G40909 Epilepsy, unspecified, not intractable, without status epilepticus: Secondary | ICD-10-CM | POA: Diagnosis not present

## 2013-07-18 DIAGNOSIS — Z79899 Other long term (current) drug therapy: Secondary | ICD-10-CM | POA: Insufficient documentation

## 2013-07-18 DIAGNOSIS — I129 Hypertensive chronic kidney disease with stage 1 through stage 4 chronic kidney disease, or unspecified chronic kidney disease: Secondary | ICD-10-CM | POA: Diagnosis not present

## 2013-07-18 DIAGNOSIS — E1169 Type 2 diabetes mellitus with other specified complication: Secondary | ICD-10-CM | POA: Diagnosis not present

## 2013-07-18 DIAGNOSIS — I120 Hypertensive chronic kidney disease with stage 5 chronic kidney disease or end stage renal disease: Secondary | ICD-10-CM | POA: Insufficient documentation

## 2013-07-18 DIAGNOSIS — R404 Transient alteration of awareness: Secondary | ICD-10-CM | POA: Diagnosis not present

## 2013-07-18 DIAGNOSIS — Z794 Long term (current) use of insulin: Secondary | ICD-10-CM | POA: Insufficient documentation

## 2013-07-18 DIAGNOSIS — N189 Chronic kidney disease, unspecified: Secondary | ICD-10-CM | POA: Diagnosis not present

## 2013-07-18 DIAGNOSIS — E1069 Type 1 diabetes mellitus with other specified complication: Secondary | ICD-10-CM | POA: Diagnosis not present

## 2013-07-18 DIAGNOSIS — E119 Type 2 diabetes mellitus without complications: Secondary | ICD-10-CM

## 2013-07-18 DIAGNOSIS — R7301 Impaired fasting glucose: Secondary | ICD-10-CM | POA: Diagnosis not present

## 2013-07-18 DIAGNOSIS — E161 Other hypoglycemia: Secondary | ICD-10-CM | POA: Diagnosis not present

## 2013-07-18 LAB — COMPREHENSIVE METABOLIC PANEL
ALK PHOS: 113 U/L (ref 39–117)
ALT: 16 U/L (ref 0–53)
AST: 28 U/L (ref 0–37)
Albumin: 2.5 g/dL — ABNORMAL LOW (ref 3.5–5.2)
BUN: 15 mg/dL (ref 6–23)
CHLORIDE: 106 meq/L (ref 96–112)
CO2: 26 meq/L (ref 19–32)
CREATININE: 2.24 mg/dL — AB (ref 0.50–1.35)
Calcium: 8.7 mg/dL (ref 8.4–10.5)
GFR calc Af Amer: 43 mL/min — ABNORMAL LOW (ref >90)
GFR, EST NON AFRICAN AMERICAN: 37 mL/min — AB (ref >90)
Glucose, Bld: 204 mg/dL — ABNORMAL HIGH (ref 70–99)
POTASSIUM: 4.5 meq/L (ref 3.7–5.3)
Sodium: 141 mEq/L (ref 137–147)
Total Bilirubin: 0.7 mg/dL (ref 0.3–1.2)
Total Protein: 6.8 g/dL (ref 6.0–8.3)

## 2013-07-18 LAB — CBC WITH DIFFERENTIAL/PLATELET
BASOS ABS: 0 10*3/uL (ref 0.0–0.1)
Basophils Relative: 0 % (ref 0–1)
Eosinophils Absolute: 0.1 10*3/uL (ref 0.0–0.7)
Eosinophils Relative: 1 % (ref 0–5)
HEMATOCRIT: 39 % (ref 39.0–52.0)
Hemoglobin: 13.5 g/dL (ref 13.0–17.0)
LYMPHS PCT: 19 % (ref 12–46)
Lymphs Abs: 1.1 10*3/uL (ref 0.7–4.0)
MCH: 28.1 pg (ref 26.0–34.0)
MCHC: 34.6 g/dL (ref 30.0–36.0)
MCV: 81.3 fL (ref 78.0–100.0)
MONO ABS: 0.3 10*3/uL (ref 0.1–1.0)
Monocytes Relative: 5 % (ref 3–12)
NEUTROS ABS: 4.7 10*3/uL (ref 1.7–7.7)
Neutrophils Relative %: 76 % (ref 43–77)
Platelets: 299 10*3/uL (ref 150–400)
RBC: 4.8 MIL/uL (ref 4.22–5.81)
RDW: 13.2 % (ref 11.5–15.5)
WBC: 6.2 10*3/uL (ref 4.0–10.5)

## 2013-07-18 LAB — CBG MONITORING, ED: Glucose-Capillary: 228 mg/dL — ABNORMAL HIGH (ref 70–99)

## 2013-07-18 NOTE — ED Notes (Signed)
Bed: [id number] Expected date:  Expected time:  Means of arrival:  Comments: EMS-hypoglycemia

## 2013-07-18 NOTE — ED Provider Notes (Addendum)
CSN: [id number]     Arrival date & time 07/18/13  1440 History   First MD Initiated Contact with Patient 07/18/13 1515     Chief Complaint  Patient presents with  . Hypoglycemia     HPI Per EMS, Pt, from home, c/o intermittent hypoglycemia and weakness x "a long time." Denies pain and n/v/d. Pt reports that he was recently diagnosed w/ an enlarged heart and put on new medications. EMS sts CBG 66 on initial arrival. Pt was shivering and diaphoretic. 1mg  glucagon IM, 24G oral glucose, pb & j sandwich, 3 cans of Mello Yellow, and 4 peanut butter crackers prior to transport. Hx of Type 1 DM. Last CBG 257  Past Medical History  Diagnosis Date  . Diabetes mellitus without complication   . Hypertension   . Seizures    History reviewed. No pertinent past surgical history. Family History  Problem Relation Age of Onset  . Hypertension Mother   . Hypertension Father    History  Substance Use Topics  . Smoking status: Never Smoker   . Smokeless tobacco: Never Used  . Alcohol Use: No    Review of Systems  All other systems reviewed and are negative  Allergies  Review of patient's allergies indicates no known allergies.  Home Medications   Prior to Admission medications   Medication Sig Start Date End Date Taking? Authorizing Provider  carvedilol (COREG) 12.5 MG tablet Take 1 tablet (12.5 mg total) by mouth 2 (two) times daily with a meal. 06/04/13   Barton Dubois, MD  furosemide (LASIX) 20 MG tablet Take 1 tablet (20 mg total) by mouth daily. 06/04/13   Barton Dubois, MD  insulin glargine (LANTUS) 100 UNIT/ML injection Inject 0.3 mLs (30 Units total) into the skin every morning. 06/04/13   Barton Dubois, MD  insulin lispro (HUMALOG) 100 UNIT/ML injection Inject 2-10 Units into the skin 3 (three) times daily before meals.    Historical Provider, MD  isosorbide-hydrALAZINE (BIDIL) 20-37.5 MG per tablet Take 1 tablet by mouth 2 (two) times daily. 06/04/13   Barton Dubois, MD  levETIRAcetam  (KEPPRA) 500 MG tablet Take 1 tablet (500 mg total) by mouth 2 (two) times daily. 06/04/13   Barton Dubois, MD  lisinopril (PRINIVIL,ZESTRIL) 40 MG tablet Take 1 tablet (40 mg total) by mouth daily. HOLD UNTIL FOLLOW UP WITH PRIMARY CARE PHYSICIAN 06/04/13   Barton Dubois, MD   BP 162/101  Pulse 72  Temp(Src) 97.5 F (36.4 C) (Oral)  SpO2 100% Physical Exam  Nursing note and vitals reviewed. Constitutional: He is oriented to person, place, and time. He appears well-developed and well-nourished. No distress.  HENT:  Head: Normocephalic and atraumatic.  Eyes: Pupils are equal, round, and reactive to light.  Neck: Normal range of motion.  Cardiovascular: Normal rate and intact distal pulses.   Pulmonary/Chest: No respiratory distress.  Abdominal: Normal appearance. He exhibits no distension. There is no tenderness. There is no rebound and no guarding.  Musculoskeletal: Normal range of motion.  Neurological: He is alert and oriented to person, place, and time. No cranial nerve deficit.  Skin: Skin is warm and dry. No rash noted.  Psychiatric: He has a normal mood and affect. His behavior is normal.    ED Course  Procedures (including critical care time) Labs Review Labs Reviewed  COMPREHENSIVE METABOLIC PANEL - Abnormal; Notable for the following:    Glucose, Bld 204 (*)    Creatinine, Ser 2.24 (*)    Albumin 2.5 (*)  GFR calc non Af Amer 37 (*)    GFR calc Af Amer 43 (*)    All other components within normal limits  CBG MONITORING, ED - Abnormal; Notable for the following:    Glucose-Capillary 228 (*)    All other components within normal limits  CBC WITH DIFFERENTIAL  HEMOGLOBIN A1C    Imaging Review No results found.  No definite cause weakness down.  Creatinine elevation is chronic and is slowly rising.  No significant drug drug interactions noted.  Patient stable for discharge.  Will discuss with patient that he should follow up with family doctor or in discuss  possibility of changing insulin dosages.  MDM   Final diagnoses:  Hypoglycemia  Diabetes  Chronic renal failure        Dot Lanes, MD 07/18/13 [place], MD 07/18/13 [phone]

## 2013-07-18 NOTE — Discharge Instructions (Signed)
Chronic Kidney Disease Chronic kidney disease occurs when the kidneys are damaged over a long period. The kidneys are two organs that lie on either side of the spine between the middle of the back and the front of the abdomen. The kidneys:   Remove wastes and extra water from the blood.   Produce important hormones. These help keep bones strong, regulate blood pressure, and help create red blood cells.   Balance the fluids and chemicals in the blood and tissues. A small amount of kidney damage may not cause problems, but a large amount of damage may make it difficult or impossible for the kidneys to work the way they should. If steps are not taken to slow down the kidney damage or stop it from getting worse, the kidneys may stop working permanently. Most of the time, chronic kidney disease does not go away. However, it can often be controlled, and those with the disease can usually live normal lives. CAUSES  The most common causes of chronic kidney disease are diabetes and high blood pressure (hypertension). Chronic kidney disease may also be caused by:   Diseases that cause kidneys' filters to become inflamed.   Diseases that affect the immune system.   Genetic diseases.   Medicines that damage the kidneys, such as anti-inflammatory medicines.  Poisoning or exposure to toxic substances.   A reoccurring kidney or urinary infection.   A problem with urine flow. This may be caused by:   Cancer.   Kidney stones.   An enlarged prostate in males. SYMPTOMS  Because the kidney damage in chronic kidney disease occurs slowly, symptoms develop slowly and may not be obvious until the kidney damage becomes severe. A person may have a kidney disease for years without showing any symptoms. Symptoms can include:   Swelling (edema) of the legs, ankles, or feet.   Tiredness (lethargy).   Nausea or vomiting.   Confusion.   Problems with urination, such as:   Decreased urine  production.   Frequent urination, especially at night.   Frequent accidents in children who are potty trained.   Muscle twitches and cramps.   Shortness of breath.  Weakness.   Persistent itchiness.   Loss of appetite.  Metallic taste in the mouth.  Trouble sleeping.  Slowed development in children.  Short stature in children. DIAGNOSIS  Chronic kidney disease may be detected and diagnosed by tests, including blood, urine, imaging, or kidney biopsy tests.  TREATMENT  Most chronic kidney diseases cannot be cured. Treatment usually involves relieving symptoms and preventing or slowing the progression of the disease. Treatment may include:   A special diet. You may need to avoid alcohol and foods thatare salty and high in potassium.   Medicines. These may:   Lower blood pressure.   Relieve anemia.   Relieve swelling.   Protect the bones. HOME CARE INSTRUCTIONS   Follow your prescribed diet.   Only take over-the-counter or prescription medicines as directed by your caregiver.  Do not take any new medicines (prescription, over-the-counter, or nutritional supplements) unless approved by your caregiver. Many medicines can worsen your kidney damage or need to have the dose adjusted.   Quit smoking if you are a smoker. Talk to your caregiver about a smoking cessation program.   Keep all follow-up appointments as directed by your caregiver. SEEK IMMEDIATE MEDICAL CARE IF:  Your symptoms get worse or you develop new symptoms.   You develop symptoms of end-stage kidney disease. These include:   Headaches.  Abnormally dark or light skin.   Numbness in the hands or feet.   Easy bruising.   Frequent hiccups.   Menstruation stops.   You have a fever.   You have decreased urine production.   You havepain or bleeding when urinating. MAKE SURE YOU:  Understand these instructions.  Will watch your condition.  Will get help right  away if you are not doing well or get worse. FOR MORE INFORMATION  American Association of Kidney Patients: BombTimer.gl National Kidney Foundation: www.kidney.[place]: https://mathis.com/ [facility]: www.lifeoptions.org and www.kidneyschool.org Document Released: 12/01/2007 Document Revised: 02/08/2012 Document Reviewed: 10/21/2011 [facility] Patient Information 2014 [place], [place].  Diabetes Meal Planning Guide The diabetes meal planning guide is a tool to help you plan your meals and snacks. It is important for people with diabetes to manage their blood glucose (sugar) levels. Choosing the right foods and the right amounts throughout your day will help control your blood glucose. Eating right can even help you improve your blood pressure and reach or maintain a healthy weight. CARBOHYDRATE COUNTING MADE EASY When you eat carbohydrates, they turn to sugar. This raises your blood glucose level. Counting carbohydrates can help you control this level so you feel better. When you plan your meals by counting carbohydrates, you can have more flexibility in what you eat and balance your medicine with your food intake. Carbohydrate counting simply means adding up the total amount of carbohydrate grams in your meals and snacks. Try to eat about the same amount at each meal. Foods with carbohydrates are listed below. Each portion below is 1 carbohydrate serving or 15 grams of carbohydrates. Ask your dietician how many grams of carbohydrates you should eat at each meal or snack. Grains and Starches  1 slice bread.   English muffin or hotdog/hamburger bun.   cup cold cereal (unsweetened).   cup cooked pasta or rice.   cup starchy vegetables (corn, potatoes, peas, beans, winter squash).  1 tortilla (6 inches).   bagel.  1 waffle or pancake (size of a CD).   cup cooked cereal.  4 to 6 small crackers. *Whole grain is recommended. Fruit  1 cup fresh  unsweetened berries, melon, papaya, pineapple.  1 small fresh fruit.   banana or mango.   cup fruit juice (4 oz unsweetened).   cup canned fruit in natural juice or water.  2 tbs dried fruit.  12 to 15 grapes or cherries. Milk and Yogurt  1 cup fat-free or 1% milk.  1 cup soy milk.  6 oz light yogurt with sugar-free sweetener.  6 oz low-fat soy yogurt.  6 oz plain yogurt. Vegetables  1 cup raw or  cup cooked is counted as 0 carbohydrates or a "free" food.  If you eat 3 or more servings at 1 meal, count them as 1 carbohydrate serving. Other Carbohydrates   oz chips or pretzels.   cup ice cream or frozen yogurt.   cup sherbet or sorbet.  2 inch square cake, no frosting.  1 tbs honey, sugar, jam, jelly, or syrup.  2 small cookies.  3 squares of graham crackers.  3 cups popcorn.  6 crackers.  1 cup broth-based soup.  Count 1 cup casserole or other mixed foods as 2 carbohydrate servings.  Foods with less than 20 calories in a serving may be counted as 0 carbohydrates or a "free" food. You may want to purchase a book or computer software that lists the carbohydrate gram counts of different  foods. In addition, the nutrition facts panel on the labels of the foods you eat are a good source of this information. The label will tell you how big the serving size is and the total number of carbohydrate grams you will be eating per serving. Divide this number by 15 to obtain the number of carbohydrate servings in a portion. Remember, 1 carbohydrate serving equals 15 grams of carbohydrate. SERVING SIZES Measuring foods and serving sizes helps you make sure you are getting the right amount of food. The list below tells how big or small some common serving sizes are.  1 oz.........4 stacked dice.  3 oz........Marland KitchenDeck of cards.  1 tsp.......Marland KitchenTip of little finger.  1 tbs......Marland KitchenMarland KitchenThumb.  2 tbs.......Marland KitchenGolf ball.   cup......Marland KitchenHalf of a fist.  1 cup.......Marland KitchenA  fist. SAMPLE DIABETES MEAL PLAN Below is a sample meal plan that includes foods from the grain and starches, dairy, vegetable, fruit, and meat groups. A dietician can individualize a meal plan to fit your calorie needs and tell you the number of servings needed from each food group. However, controlling the total amount of carbohydrates in your meal or snack is more important than making sure you include all of the food groups at every meal. You may interchange carbohydrate containing foods (dairy, starches, and fruits). The meal plan below is an example of a 2000 calorie diet using carbohydrate counting. This meal plan has 17 carbohydrate servings. Breakfast  1 cup oatmeal (2 carb servings).   cup light yogurt (1 carb serving).  1 cup blueberries (1 carb serving).   cup almonds. Snack  1 large apple (2 carb servings).  1 low-fat string cheese stick. Lunch  Chicken breast salad.  1 cup spinach.   cup chopped tomatoes.  2 oz chicken breast, sliced.  2 tbs low-fat [place] dressing.  12 whole-wheat crackers (2 carb servings).  12 to 15 grapes (1 carb serving).  1 cup low-fat milk (1 carb serving). Snack  1 cup carrots.   cup hummus (1 carb serving). Dinner  3 oz broiled salmon.  1 cup brown rice (3 carb servings). Snack  1  cups steamed broccoli (1 carb serving) drizzled with 1 tsp olive oil and lemon juice.  1 cup light pudding (2 carb servings). DIABETES MEAL PLANNING WORKSHEET Your dietician can use this worksheet to help you decide how many servings of foods and what types of foods are right for you.  BREAKFAST Food Group and Servings / Carb Servings Grain/Starches __________________________________ Dairy __________________________________________ Vegetable ______________________________________ Fruit ___________________________________________ Meat __________________________________________ Fat ____________________________________________ LUNCH Food  Group and Servings / Carb Servings Grain/Starches ___________________________________ Dairy ___________________________________________ Fruit ____________________________________________ Meat ___________________________________________ Fat _____________________________________________ Edward Colon Food Group and Servings / Carb Servings Grain/Starches ___________________________________ Dairy ___________________________________________ Fruit ____________________________________________ Meat ___________________________________________ Fat _____________________________________________ SNACKS Food Group and Servings / Carb Servings Grain/Starches ___________________________________ Dairy ___________________________________________ Vegetable _______________________________________ Fruit ____________________________________________ Meat ___________________________________________ Fat _____________________________________________ DAILY TOTALS Starches _________________________ Vegetable ________________________ Fruit ____________________________ Dairy ____________________________ Meat ____________________________ Fat ______________________________ Document Released: 11/18/2004 Document Revised: 05/16/2011 Document Reviewed: 09/29/2008 [employer] Patient Information 2014 [employer].

## 2013-07-18 NOTE — ED Notes (Addendum)
Per EMS, Pt, from home, c/o intermittent hypoglycemia and weakness x "a long time."  Denies pain and n/v/d.  Pt reports that he was recently diagnosed w/ an enlarged heart and put on new medications.  EMS sts CBG 66 on initial arrival.  Pt was shivering and diaphoretic.  1mg  glucagon IM, 24G oral glucose, pb & j sandwich, 3 cans of Mello Yellow, and 4 peanut butter crackers prior to transport.  Hx of Type 1 DM.  Last CBG 257.

## 2013-07-19 LAB — HEMOGLOBIN A1C
Hgb A1c MFr Bld: 7.9 % — ABNORMAL HIGH (ref <5.7)
Mean Plasma Glucose: 180 mg/dL — ABNORMAL HIGH (ref <117)

## 2013-08-01 ENCOUNTER — Ambulatory Visit: Payer: Medicare Other | Admitting: Neurology

## 2013-08-12 DIAGNOSIS — E785 Hyperlipidemia, unspecified: Secondary | ICD-10-CM | POA: Diagnosis not present

## 2013-08-12 DIAGNOSIS — I10 Essential (primary) hypertension: Secondary | ICD-10-CM | POA: Diagnosis not present

## 2013-08-12 DIAGNOSIS — E109 Type 1 diabetes mellitus without complications: Secondary | ICD-10-CM | POA: Diagnosis not present

## 2013-08-12 DIAGNOSIS — N183 Chronic kidney disease, stage 3 unspecified: Secondary | ICD-10-CM | POA: Diagnosis not present

## 2013-08-30 ENCOUNTER — Emergency Department (HOSPITAL_COMMUNITY)
Admission: EM | Admit: 2013-08-30 | Discharge: 2013-08-30 | Disposition: A | Discharge status: Home / Self Care | Payer: Medicare Other | Attending: Emergency Medicine | Admitting: Emergency Medicine

## 2013-08-30 DIAGNOSIS — Z79899 Other long term (current) drug therapy: Secondary | ICD-10-CM | POA: Insufficient documentation

## 2013-08-30 DIAGNOSIS — E1169 Type 2 diabetes mellitus with other specified complication: Secondary | ICD-10-CM | POA: Diagnosis not present

## 2013-08-30 DIAGNOSIS — Z794 Long term (current) use of insulin: Secondary | ICD-10-CM | POA: Insufficient documentation

## 2013-08-30 DIAGNOSIS — E161 Other hypoglycemia: Secondary | ICD-10-CM | POA: Diagnosis not present

## 2013-08-30 DIAGNOSIS — R569 Unspecified convulsions: Secondary | ICD-10-CM

## 2013-08-30 DIAGNOSIS — I10 Essential (primary) hypertension: Secondary | ICD-10-CM | POA: Diagnosis not present

## 2013-08-30 DIAGNOSIS — G40909 Epilepsy, unspecified, not intractable, without status epilepticus: Secondary | ICD-10-CM | POA: Diagnosis not present

## 2013-08-30 DIAGNOSIS — R7301 Impaired fasting glucose: Secondary | ICD-10-CM | POA: Diagnosis not present

## 2013-08-30 DIAGNOSIS — E162 Hypoglycemia, unspecified: Secondary | ICD-10-CM

## 2013-08-30 LAB — CBC WITH DIFFERENTIAL/PLATELET
BASOS ABS: 0 10*3/uL (ref 0.0–0.1)
Basophils Relative: 0 % (ref 0–1)
Eosinophils Absolute: 0.1 10*3/uL (ref 0.0–0.7)
Eosinophils Relative: 1 % (ref 0–5)
HEMATOCRIT: 36.9 % — AB (ref 39.0–52.0)
Hemoglobin: 12.6 g/dL — ABNORMAL LOW (ref 13.0–17.0)
LYMPHS PCT: 28 % (ref 12–46)
Lymphs Abs: 1.5 10*3/uL (ref 0.7–4.0)
MCH: 27.6 pg (ref 26.0–34.0)
MCHC: 34.1 g/dL (ref 30.0–36.0)
MCV: 80.7 fL (ref 78.0–100.0)
MONO ABS: 0.4 10*3/uL (ref 0.1–1.0)
MONOS PCT: 7 % (ref 3–12)
NEUTROS PCT: 64 % (ref 43–77)
Neutro Abs: 3.4 10*3/uL (ref 1.7–7.7)
Platelets: 280 10*3/uL (ref 150–400)
RBC: 4.57 MIL/uL (ref 4.22–5.81)
RDW: 13 % (ref 11.5–15.5)
WBC: 5.4 10*3/uL (ref 4.0–10.5)

## 2013-08-30 LAB — CBG MONITORING, ED
Glucose-Capillary: 199 mg/dL — ABNORMAL HIGH (ref 70–99)
Glucose-Capillary: 221 mg/dL — ABNORMAL HIGH (ref 70–99)

## 2013-08-30 LAB — BASIC METABOLIC PANEL
BUN: 25 mg/dL — AB (ref 6–23)
CO2: 24 meq/L (ref 19–32)
Calcium: 8.1 mg/dL — ABNORMAL LOW (ref 8.4–10.5)
Chloride: 102 mEq/L (ref 96–112)
Creatinine, Ser: 2.43 mg/dL — ABNORMAL HIGH (ref 0.50–1.35)
GFR calc Af Amer: 39 mL/min — ABNORMAL LOW (ref >90)
GFR calc non Af Amer: 34 mL/min — ABNORMAL LOW (ref >90)
Glucose, Bld: 206 mg/dL — ABNORMAL HIGH (ref 70–99)
Potassium: 5.3 mEq/L (ref 3.7–5.3)
Sodium: 134 mEq/L — ABNORMAL LOW (ref 137–147)

## 2013-08-30 LAB — URINE MICROSCOPIC-ADD ON

## 2013-08-30 LAB — URINALYSIS, ROUTINE W REFLEX MICROSCOPIC
Bilirubin Urine: NEGATIVE
Glucose, UA: 500 mg/dL — AB
KETONES UR: NEGATIVE mg/dL
LEUKOCYTES UA: NEGATIVE
NITRITE: NEGATIVE
PH: 5.5 (ref 5.0–8.0)
Protein, ur: >300 mg/dL — AB
Specific Gravity, Urine: 1.017 (ref 1.005–1.030)
Urobilinogen, UA: 0.2 mg/dL (ref 0.0–1.0)

## 2013-08-30 NOTE — ED Provider Notes (Signed)
CSN: [id number]     Arrival date & time 08/30/13  0040 History   First MD Initiated Contact with Patient 08/30/13 0400     Chief Complaint  Patient presents with  . Hypoglycemia  . Seizures     (Consider location/radiation/quality/duration/timing/severity/associated sxs/prior Treatment) Patient is a 33 y.o. male presenting with hypoglycemia.  Hypoglycemia Initial blood sugar:  25 Severity:  Severe Onset quality:  Sudden Duration:  1 day Timing:  Constant Progression:  Resolved Chronicity:  Recurrent Diabetic status:  Controlled with insulin Current diabetic therapy:  Lantus and SSI Context comment:  Pt reports good compliance.  states last thing he remembers was eating. Relieved by:  Glucagon Associated symptoms: altered mental status and seizures   Associated symptoms: no shortness of breath and no vomiting   Seizures:    Seizure activity on arrival: no     Seizure type:  Grand mal   Severity:  Severe   Duration:  30 seconds   Timing:  Once   Progression:  Resolved   Chronicity:  Recurrent   Past Medical History  Diagnosis Date  . Diabetes mellitus without complication   . Hypertension   . Seizures    No past surgical history on file. Family History  Problem Relation Age of Onset  . Hypertension Mother   . Hypertension Father    History  Substance Use Topics  . Smoking status: Never Smoker   . Smokeless tobacco: Never Used  . Alcohol Use: No    Review of Systems  Constitutional: Negative for fever.  HENT: Negative for congestion.   Respiratory: Negative for cough and shortness of breath.   Cardiovascular: Negative for chest pain.  Gastrointestinal: Negative for nausea, vomiting, abdominal pain and diarrhea.  Neurological: Positive for seizures.  All other systems reviewed and are negative.     Allergies  Review of patient's allergies indicates no known allergies.  Home Medications   Prior to Admission medications   Medication Sig Start Date  End Date Taking? Authorizing Provider  carvedilol (COREG) 12.5 MG tablet Take 1 tablet (12.5 mg total) by mouth 2 (two) times daily with a meal. 06/04/13  Yes Barton Dubois, MD  furosemide (LASIX) 20 MG tablet Take 1 tablet (20 mg total) by mouth daily. 06/04/13  Yes Barton Dubois, MD  insulin glargine (LANTUS) 100 UNIT/ML injection Inject 0.3 mLs (30 Units total) into the skin every morning. 06/04/13  Yes Barton Dubois, MD  insulin lispro (HUMALOG) 100 UNIT/ML injection Inject 2-10 Units into the skin 3 (three) times daily before meals.   Yes Historical Provider, MD  levETIRAcetam (KEPPRA) 500 MG tablet Take 1 tablet (500 mg total) by mouth 2 (two) times daily. 06/04/13  Yes Barton Dubois, MD   BP 160/96  Pulse 80  Temp(Src) 97.9 F (36.6 C) (Oral)  Resp 18  SpO2 99% Physical Exam  Nursing note and vitals reviewed. Constitutional: He is oriented to person, place, and time. He appears well-developed and well-nourished. No distress.  HENT:  Head: Normocephalic and atraumatic.  Mouth/Throat: Oropharynx is clear and moist.  Eyes: Conjunctivae are normal. Pupils are equal, round, and reactive to light. No scleral icterus.  Neck: Neck supple.  Cardiovascular: Normal rate, regular rhythm, normal heart sounds and intact distal pulses.   No murmur heard. Pulmonary/Chest: Effort normal and breath sounds normal. No stridor. No respiratory distress. He has no wheezes. He has no rales.  Abdominal: Soft. He exhibits no distension. There is no tenderness.  Musculoskeletal: Normal range of motion. He  exhibits no edema.  Neurological: He is alert and oriented to person, place, and time. He has normal strength. GCS eye subscore is 4. GCS verbal subscore is 5. GCS motor subscore is 6.  Skin: Skin is warm and dry. No rash noted.  Psychiatric: He has a normal mood and affect. His behavior is normal.    ED Course  Procedures (including critical care time) Labs Review Labs Reviewed  CBC WITH DIFFERENTIAL -  Abnormal; Notable for the following:    Hemoglobin 12.6 (*)    HCT 36.9 (*)    All other components within normal limits  BASIC METABOLIC PANEL - Abnormal; Notable for the following:    Sodium 134 (*)    Glucose, Bld 206 (*)    BUN 25 (*)    Creatinine, Ser 2.43 (*)    Calcium 8.1 (*)    GFR calc non Af Amer 34 (*)    GFR calc Af Amer 39 (*)    All other components within normal limits  CBG MONITORING, ED - Abnormal; Notable for the following:    Glucose-Capillary 199 (*)    All other components within normal limits  CBG MONITORING, ED - Abnormal; Notable for the following:    Glucose-Capillary 221 (*)    All other components within normal limits  URINALYSIS, ROUTINE W REFLEX MICROSCOPIC    Imaging Review No results found.   EKG Interpretation None      MDM   Final diagnoses:  Hypoglycemia  Seizure    33 yo male with history of diabetes presenting with hypoglycemia. Reportedly, his parents found him unresponsive, and called EMS. They found his blood sugar to be very low and administered glucagon. He apparently had a seizure lasting approximately 30 seconds while EMS was at the house. He denied any infectious symptoms and stated that he felt his normal healthy self earlier today. Currently, his only complaint is fatigue. Blood sugars have improved. Plan to check lab work and monitor.  Pt feels much better.  Labs unremarkable.  Glucose stable.  Advised close outpatient follow up.   Houston Siren III, MD 08/31/13 [phone]

## 2013-08-30 NOTE — ED Notes (Signed)
Pt reports hx of seizures as of 5 months ago. Pt reports taking keppa twice a day. Pt reports remembers eating and remembers laying down last night but cannot recalled after that.  Pt a/o x4 at present time. Pt denies pain at present time.

## 2013-08-30 NOTE — ED Notes (Signed)
Pt arrives by EMS with complaints of hypoglycemia with blood sugar of 25 by EMS-pt lives at home with his parents and states they went upstairs to check on him and found pt on the floor unresponsive/called 911-unable to obtain IV access-given Glucagon 1 mg IM.  Blood sugar was 40, then 70, then 105, and then 175. Pt slow to respond-post-ictal.  EMS states witnessed seizure 30 seconds prior to transport to ED.

## 2013-08-30 NOTE — ED Notes (Signed)
Pt. CBG 221.

## 2013-08-30 NOTE — ED Notes (Signed)
Bed: [id number] Expected date:  Expected time:  Means of arrival:  Comments: EMS/diabetic

## 2013-08-30 NOTE — Discharge Instructions (Signed)
Hypoglycemia °Hypoglycemia occurs when the glucose in your blood is too low. Glucose is a type of sugar that is your body's main energy source. Hormones, such as insulin and glucagon, control the level of glucose in the blood. Insulin lowers blood glucose and glucagon increases blood glucose. Having too much insulin in your blood stream, or not eating enough food containing sugar, can result in hypoglycemia. Hypoglycemia can happen to people with or without diabetes. It can develop quickly and can be a medical emergency.  °CAUSES  °· Missing or delaying meals. °· Not eating enough carbohydrates at meals. °· Taking too much diabetes medicine. °· Not timing your oral diabetes medicine or insulin doses with meals, snacks, and exercise. °· Nausea and vomiting. °· Certain medicines. °· Severe illnesses, such as hepatitis, kidney disorders, and certain eating disorders. °· Increased activity or exercise without eating something extra or adjusting medicines. °· Drinking too much alcohol. °· A nerve disorder that affects body functions like your heart rate, blood pressure, and digestion (autonomic neuropathy). °· A condition where the stomach muscles do not function properly (gastroparesis). Therefore, medicines and food may not absorb properly. °· Rarely, a tumor of the pancreas can produce too much insulin. °SYMPTOMS  °· Hunger. °· Sweating (diaphoresis). °· Change in body temperature. °· Shakiness. °· Headache. °· Anxiety. °· Lightheadedness. °· Irritability. °· Difficulty concentrating. °· Dry mouth. °· Tingling or numbness in the hands or feet. °· Restless sleep or sleep disturbances. °· Altered speech and coordination. °· Change in mental status. °· Seizures or prolonged convulsions. °· Combativeness. °· Drowsiness (lethargic). °· Weakness. °· Increased heart rate or palpitations. °· Confusion. °· Pale, gray skin color. °· Blurred or double vision. °· Fainting. °DIAGNOSIS  °A physical exam and medical history will be  performed. Your caregiver may make a diagnosis based on your symptoms. Blood tests and other lab tests may be performed to confirm a diagnosis. Once the diagnosis is made, your caregiver will see if your signs and symptoms go away once your blood glucose is raised.  °TREATMENT  °Usually, you can easily treat your hypoglycemia when you notice symptoms. °· Check your blood glucose. If it is less than 70 mg/dl, take one of the following:   °¨ 3-4 glucose tablets.   °¨ ½ cup juice.   °¨ ½ cup regular soda.   °¨ 1 cup skim milk.   °¨ ½-1 tube of glucose gel.   °¨ 5-6 hard candies.   °· Avoid high-fat drinks or food that may delay a rise in blood glucose levels. °· Do not take more than the recommended amount of sugary foods, drinks, gel, or tablets. Doing so will cause your blood glucose to go too high.   °· Wait 10-15 minutes and recheck your blood glucose. If it is still less than 70 mg/dl or below your target range, repeat treatment.   °· Eat a snack if it is more than 1 hour until your next meal.   °There may be a time when your blood glucose may go so low that you are unable to treat yourself at home when you start to notice symptoms. You may need someone to help you. You may even faint or be unable to swallow. If you cannot treat yourself, someone will need to bring you to the hospital.  °HOME CARE INSTRUCTIONS °· If you have diabetes, follow your diabetes management plan by: °¨ Taking your medicines as directed. °¨ Following your exercise plan. °¨ Following your meal plan. Do not skip meals. Eat on time. °¨ Testing your blood   glucose regularly. Check your blood glucose before and after exercise. If you exercise longer or different than usual, be sure to check blood glucose more frequently.  Wearing your medical alert jewelry that says you have diabetes.  Identify the cause of your hypoglycemia. Then, develop ways to prevent the recurrence of hypoglycemia.  Do not take a hot bath or shower right after an  insulin shot.  Always carry treatment with you. Glucose tablets are the easiest to carry.  If you are going to drink alcohol, drink it only with meals.  Tell friends or family members ways to keep you safe during a seizure. This may include removing hard or sharp objects from the area or turning you on your side.  Maintain a healthy weight. SEEK MEDICAL CARE IF:   You are having problems keeping your blood glucose in your target range.  You are having frequent episodes of hypoglycemia.  You feel you might be having side effects from your medicines.  You are not sure why your blood glucose is dropping so low.  You notice a change in vision or a new problem with your vision. SEEK IMMEDIATE MEDICAL CARE IF:   Confusion develops.  A change in mental status occurs.  The inability to swallow develops.  Fainting occurs. Document Released: 02/21/2005 Document Revised: 02/26/2013 Document Reviewed: 06/20/2011 [facility] Patient Information 2015 [place], [place]. This information is not intended to replace advice given to you by your health care provider. Make sure you discuss any questions you have with your health care provider.  Seizure, Adult A seizure is abnormal electrical activity in the brain. Seizures usually last from 30 seconds to 2 minutes. There are various types of seizures. Before a seizure, you may have a warning sensation (aura) that a seizure is about to occur. An aura may include the following symptoms:   Fear or anxiety.  Nausea.  Feeling like the room is spinning (vertigo).  Vision changes, such as seeing flashing lights or spots. Common symptoms during a seizure include:  A change in attention or behavior (altered mental status).  Convulsions with rhythmic jerking movements.  Drooling.  Rapid eye movements.  Grunting.  Loss of bladder and bowel control.  Bitter taste in the mouth.  Tongue biting. After a seizure, you may feel confused and sleepy.  You may also have an injury resulting from convulsions during the seizure. HOME CARE INSTRUCTIONS   If you are given medicines, take them exactly as prescribed by your health care provider.  Keep all follow-up appointments as directed by your health care provider.  Do not swim or drive or engage in risky activity during which a seizure could cause further injury to you or others until your health care provider says it is OK.  Get adequate rest.  Teach friends and family what to do if you have a seizure. They should:  Lay you on the ground to prevent a fall.  Put a cushion under your head.  Loosen any tight clothing around your neck.  Turn you on your side. If vomiting occurs, this helps keep your airway clear.  Stay with you until you recover.  Know whether or not you need emergency care. SEEK IMMEDIATE MEDICAL CARE IF:  The seizure lasts longer than 5 minutes.  The seizure is severe or you do not wake up immediately after the seizure.  You have an altered mental status after the seizure.  You are having more frequent or worsening seizures. Someone should drive you to the  emergency department or call local emergency services (911 in [place] MAKE SURE YOU:  Understand these instructions.  Will watch your condition.  Will get help right away if you are not doing well or get worse. Document Released: 02/19/2000 Document Revised: 12/12/2012 Document Reviewed: 10/03/2012 [facility] Patient Information 2015 [place], [place]. This information is not intended to replace advice given to you by your health care provider. Make sure you discuss any questions you have with your health care provider.

## 2013-08-30 NOTE — ED Notes (Signed)
Unable to obtain  IV access, paged IV Team for IV access and lab draws.

## 2013-10-26 ENCOUNTER — Emergency Department (HOSPITAL_COMMUNITY)
Admission: EM | Admit: 2013-10-26 | Discharge: 2013-10-27 | Disposition: A | Discharge status: Home / Self Care | Payer: Medicare Other | Attending: Emergency Medicine | Admitting: Emergency Medicine

## 2013-10-26 ENCOUNTER — Encounter (HOSPITAL_COMMUNITY): Payer: Self-pay | Admitting: Emergency Medicine

## 2013-10-26 ENCOUNTER — Emergency Department (HOSPITAL_COMMUNITY): Payer: Medicare Other

## 2013-10-26 DIAGNOSIS — R7301 Impaired fasting glucose: Secondary | ICD-10-CM | POA: Diagnosis not present

## 2013-10-26 DIAGNOSIS — E162 Hypoglycemia, unspecified: Secondary | ICD-10-CM | POA: Diagnosis not present

## 2013-10-26 DIAGNOSIS — I10 Essential (primary) hypertension: Secondary | ICD-10-CM | POA: Insufficient documentation

## 2013-10-26 DIAGNOSIS — Z794 Long term (current) use of insulin: Secondary | ICD-10-CM | POA: Insufficient documentation

## 2013-10-26 DIAGNOSIS — E1169 Type 2 diabetes mellitus with other specified complication: Secondary | ICD-10-CM | POA: Insufficient documentation

## 2013-10-26 DIAGNOSIS — Z79899 Other long term (current) drug therapy: Secondary | ICD-10-CM | POA: Insufficient documentation

## 2013-10-26 DIAGNOSIS — E161 Other hypoglycemia: Secondary | ICD-10-CM | POA: Diagnosis not present

## 2013-10-26 LAB — CBC
HCT: 36.5 % — ABNORMAL LOW (ref 39.0–52.0)
HEMOGLOBIN: 12.3 g/dL — AB (ref 13.0–17.0)
MCH: 27.5 pg (ref 26.0–34.0)
MCHC: 33.7 g/dL (ref 30.0–36.0)
MCV: 81.7 fL (ref 78.0–100.0)
Platelets: 261 10*3/uL (ref 150–400)
RBC: 4.47 MIL/uL (ref 4.22–5.81)
RDW: 13.3 % (ref 11.5–15.5)
WBC: 7.6 10*3/uL (ref 4.0–10.5)

## 2013-10-26 LAB — URINALYSIS, ROUTINE W REFLEX MICROSCOPIC
Bilirubin Urine: NEGATIVE
Glucose, UA: 250 mg/dL — AB
Ketones, ur: NEGATIVE mg/dL
Leukocytes, UA: NEGATIVE
NITRITE: NEGATIVE
PH: 5.5 (ref 5.0–8.0)
Specific Gravity, Urine: 1.021 (ref 1.005–1.030)
Urobilinogen, UA: 0.2 mg/dL (ref 0.0–1.0)

## 2013-10-26 LAB — URINE MICROSCOPIC-ADD ON

## 2013-10-26 LAB — CBG MONITORING, ED
GLUCOSE-CAPILLARY: 153 mg/dL — AB (ref 70–99)
Glucose-Capillary: 215 mg/dL — ABNORMAL HIGH (ref 70–99)

## 2013-10-26 NOTE — ED Notes (Signed)
Unsuccessful lab draw. Main lab contacted and will come attempt.

## 2013-10-26 NOTE — ED Notes (Signed)
Pt was found by his father to be unresponsive about 1910, last seen normal about three hours prior, CBG per EMS was 30, given amp D50 and glucagon, pt was still lethargic.

## 2013-10-26 NOTE — ED Provider Notes (Signed)
CSN: [id number]     Arrival date & time 10/26/13  2021 History   First MD Initiated Contact with Patient 10/26/13 2032     Chief Complaint  Patient presents with  . Hypoglycemia     (Consider location/radiation/quality/duration/timing/severity/associated sxs/prior Treatment) Patient is a 33 y.o. male presenting with hypoglycemia. The history is provided by the patient.  Hypoglycemia Initial blood sugar:  30s Blood sugar after intervention:  150s Severity:  Severe Onset quality:  Sudden Timing:  Constant Progression:  Unchanged Chronicity:  New Diabetic status:  Controlled with insulin Current diabetic therapy:  30 Lantus in the morning, SSI with meals Context: exercise   Context: not decreased oral intake and not diet changes   Relieved by:  Nothing Ineffective treatments:  None tried Associated symptoms: altered mental status (unresponsive)   Associated symptoms: no seizures, no sweats and no vomiting   Risk factors: no alcohol abuse, no kidney disease, no pregnancy, no recent surgery and no uncontrolled diabetes     Past Medical History  Diagnosis Date  . Diabetes mellitus without complication   . Hypertension   . Seizures    History reviewed. No pertinent past surgical history. Family History  Problem Relation Age of Onset  . Hypertension Mother   . Hypertension Father    History  Substance Use Topics  . Smoking status: Never Smoker   . Smokeless tobacco: Never Used  . Alcohol Use: No    Review of Systems  Constitutional: Negative for diaphoresis.  Respiratory: Negative for cough.   Gastrointestinal: Negative for vomiting.  Neurological: Negative for seizures.  All other systems reviewed and are negative.     Allergies  Review of patient's allergies indicates no known allergies.  Home Medications   Prior to Admission medications   Medication Sig Start Date End Date Taking? Authorizing Provider  carvedilol (COREG) 12.5 MG tablet Take 1 tablet (12.5  mg total) by mouth 2 (two) times daily with a meal. 06/04/13   Barton Dubois, MD  furosemide (LASIX) 20 MG tablet Take 1 tablet (20 mg total) by mouth daily. 06/04/13   Barton Dubois, MD  insulin glargine (LANTUS) 100 UNIT/ML injection Inject 0.3 mLs (30 Units total) into the skin every morning. 06/04/13   Barton Dubois, MD  insulin lispro (HUMALOG) 100 UNIT/ML injection Inject 2-10 Units into the skin 3 (three) times daily before meals.    Historical Provider, MD  levETIRAcetam (KEPPRA) 500 MG tablet Take 1 tablet (500 mg total) by mouth 2 (two) times daily. 06/04/13   Barton Dubois, MD   BP 189/106  Pulse 81  Temp(Src) 97.3 F (36.3 C) (Oral)  Resp 18  SpO2 100% Physical Exam  Constitutional: He is oriented to person, place, and time. He appears well-developed and well-nourished. No distress.  HENT:  Head: Normocephalic and atraumatic.  Mouth/Throat: Oropharynx is clear and moist. No oropharyngeal exudate.  Eyes: EOM are normal. Pupils are equal, round, and reactive to light.  Neck: Normal range of motion. Neck supple.  Cardiovascular: Normal rate and regular rhythm.  Exam reveals no friction rub.   No murmur heard. Pulmonary/Chest: Effort normal and breath sounds normal. No respiratory distress. He has no wheezes. He has no rales.  Abdominal: Soft. He exhibits no distension. There is no tenderness. There is no rebound.  Musculoskeletal: Normal range of motion. He exhibits no edema.  Neurological: He is alert and oriented to person, place, and time.  Skin: No rash noted. He is not diaphoretic.    ED Course  Procedures (including critical care time) Labs Review Labs Reviewed  CBG MONITORING, ED - Abnormal; Notable for the following:    Glucose-Capillary 153 (*)    All other components within normal limits  CBC  COMPREHENSIVE METABOLIC PANEL  URINALYSIS, ROUTINE W REFLEX MICROSCOPIC    Imaging Review Dg Chest 2 View  10/26/2013   CLINICAL DATA:  33 year old male with  hypoglycemia. Initial encounter.  EXAM: CHEST  2 VIEW  COMPARISON:  05/31/2013 and earlier.  FINDINGS: Semi upright AP and lateral views of the chest. Stable lung volumes. Stable cardiac size at the upper limits of normal. Other mediastinal contours are within normal limits. The lungs are clear. No pneumothorax or effusion. Visualized tracheal air column is within normal limits.  IMPRESSION: No acute cardiopulmonary abnormality.   Electronically Signed   By: Lars Pinks M.D.   On: 10/26/2013 21:06     EKG Interpretation None      MDM   Final diagnoses:  Hypoglycemia    70F presents with hypoglycemia. Found unresponsive by Dad. Took normal amount of insulin this morning, 30 units, and did SSI with breakfast. No SSI at lunch because sugars were ok. Began feeling poorly after lunch. No illicit drugs or activites last night. No SI. BPs here elevated, sugars 153 - received glucagon and D50 with EMS. Exam benign, will check labs, urine. Labs ok, Sugars ok. Stable for discharge. Feeling a little better, nothing to treat for his hypoglycemia.  Evelina Bucy, MD 10/27/13 [phone]

## 2013-10-26 NOTE — ED Notes (Signed)
Bed: RESB Expected date: 10/26/13 Expected time: 8:05 PM Means of arrival: Ambulance Comments: Unresponsive/hypoglycemic

## 2013-10-26 NOTE — ED Notes (Signed)
Pt to radiology.

## 2013-10-26 NOTE — ED Notes (Signed)
Main Lab unable to obtain blood. RN will be made aware.

## 2013-10-26 NOTE — ED Notes (Signed)
Initial Contact - pt drowsy, but awake and verbal.  Pt reports feeling generally unwell, tremulous.  C/o feeling cold.  Skin cool.  Pt reports taking insulin this AM and last blood sugar in the afternoon was normal.  Speaking full sentences, rr even/un-lab.  MAEI. Placed to cardiac/02 monitor.  NAD.

## 2013-10-27 LAB — COMPREHENSIVE METABOLIC PANEL
ALBUMIN: 2.7 g/dL — AB (ref 3.5–5.2)
ALT: 12 U/L (ref 0–53)
AST: 22 U/L (ref 0–37)
Alkaline Phosphatase: 109 U/L (ref 39–117)
Anion gap: 11 (ref 5–15)
BILIRUBIN TOTAL: 0.4 mg/dL (ref 0.3–1.2)
BUN: 26 mg/dL — ABNORMAL HIGH (ref 6–23)
CHLORIDE: 106 meq/L (ref 96–112)
CO2: 23 mEq/L (ref 19–32)
CREATININE: 2.67 mg/dL — AB (ref 0.50–1.35)
Calcium: 8.5 mg/dL (ref 8.4–10.5)
GFR calc Af Amer: 35 mL/min — ABNORMAL LOW (ref >90)
GFR calc non Af Amer: 30 mL/min — ABNORMAL LOW (ref >90)
Glucose, Bld: 225 mg/dL — ABNORMAL HIGH (ref 70–99)
Potassium: 4.9 mEq/L (ref 3.7–5.3)
Sodium: 140 mEq/L (ref 137–147)
Total Protein: 7 g/dL (ref 6.0–8.3)

## 2013-10-27 NOTE — Discharge Instructions (Signed)
Hypoglycemia °Hypoglycemia occurs when the glucose in your blood is too low. Glucose is a type of sugar that is your body's main energy source. Hormones, such as insulin and glucagon, control the level of glucose in the blood. Insulin lowers blood glucose and glucagon increases blood glucose. Having too much insulin in your blood stream, or not eating enough food containing sugar, can result in hypoglycemia. Hypoglycemia can happen to people with or without diabetes. It can develop quickly and can be a medical emergency.  °CAUSES  °· Missing or delaying meals. °· Not eating enough carbohydrates at meals. °· Taking too much diabetes medicine. °· Not timing your oral diabetes medicine or insulin doses with meals, snacks, and exercise. °· Nausea and vomiting. °· Certain medicines. °· Severe illnesses, such as hepatitis, kidney disorders, and certain eating disorders. °· Increased activity or exercise without eating something extra or adjusting medicines. °· Drinking too much alcohol. °· A nerve disorder that affects body functions like your heart rate, blood pressure, and digestion (autonomic neuropathy). °· A condition where the stomach muscles do not function properly (gastroparesis). Therefore, medicines and food may not absorb properly. °· Rarely, a tumor of the pancreas can produce too much insulin. °SYMPTOMS  °· Hunger. °· Sweating (diaphoresis). °· Change in body temperature. °· Shakiness. °· Headache. °· Anxiety. °· Lightheadedness. °· Irritability. °· Difficulty concentrating. °· Dry mouth. °· Tingling or numbness in the hands or feet. °· Restless sleep or sleep disturbances. °· Altered speech and coordination. °· Change in mental status. °· Seizures or prolonged convulsions. °· Combativeness. °· Drowsiness (lethargic). °· Weakness. °· Increased heart rate or palpitations. °· Confusion. °· Pale, gray skin color. °· Blurred or double vision. °· Fainting. °DIAGNOSIS  °A physical exam and medical history will be  performed. Your caregiver may make a diagnosis based on your symptoms. Blood tests and other lab tests may be performed to confirm a diagnosis. Once the diagnosis is made, your caregiver will see if your signs and symptoms go away once your blood glucose is raised.  °TREATMENT  °Usually, you can easily treat your hypoglycemia when you notice symptoms. °· Check your blood glucose. If it is less than 70 mg/dl, take one of the following:   °¨ 3-4 glucose tablets.   °¨ ½ cup juice.   °¨ ½ cup regular soda.   °¨ 1 cup skim milk.   °¨ ½-1 tube of glucose gel.   °¨ 5-6 hard candies.   °· Avoid high-fat drinks or food that may delay a rise in blood glucose levels. °· Do not take more than the recommended amount of sugary foods, drinks, gel, or tablets. Doing so will cause your blood glucose to go too high.   °· Wait 10-15 minutes and recheck your blood glucose. If it is still less than 70 mg/dl or below your target range, repeat treatment.   °· Eat a snack if it is more than 1 hour until your next meal.   °There may be a time when your blood glucose may go so low that you are unable to treat yourself at home when you start to notice symptoms. You may need someone to help you. You may even faint or be unable to swallow. If you cannot treat yourself, someone will need to bring you to the hospital.  °HOME CARE INSTRUCTIONS °· If you have diabetes, follow your diabetes management plan by: °¨ Taking your medicines as directed. °¨ Following your exercise plan. °¨ Following your meal plan. Do not skip meals. Eat on time. °¨ Testing your blood   glucose regularly. Check your blood glucose before and after exercise. If you exercise longer or different than usual, be sure to check blood glucose more frequently. °¨ Wearing your medical alert jewelry that says you have diabetes. °· Identify the cause of your hypoglycemia. Then, develop ways to prevent the recurrence of hypoglycemia. °· Do not take a hot bath or shower right after an  insulin shot. °· Always carry treatment with you. Glucose tablets are the easiest to carry. °· If you are going to drink alcohol, drink it only with meals. °· Tell friends or family members ways to keep you safe during a seizure. This may include removing hard or sharp objects from the area or turning you on your side. °· Maintain a healthy weight. °SEEK MEDICAL CARE IF:  °· You are having problems keeping your blood glucose in your target range. °· You are having frequent episodes of hypoglycemia. °· You feel you might be having side effects from your medicines. °· You are not sure why your blood glucose is dropping so low. °· You notice a change in vision or a new problem with your vision. °SEEK IMMEDIATE MEDICAL CARE IF:  °· Confusion develops. °· A change in mental status occurs. °· The inability to swallow develops. °· Fainting occurs. °Document Released: 02/21/2005 Document Revised: 02/26/2013 Document Reviewed: 06/20/2011 °[employer]® Patient Information ©2015 [employer]. This information is not intended to replace advice given to you by your health care provider. Make sure you discuss any questions you have with your health care provider. ° °

## 2014-06-04 DIAGNOSIS — E784 Other hyperlipidemia: Secondary | ICD-10-CM | POA: Diagnosis not present

## 2014-06-04 DIAGNOSIS — E1321 Other specified diabetes mellitus with diabetic nephropathy: Secondary | ICD-10-CM | POA: Diagnosis not present

## 2014-06-04 DIAGNOSIS — N183 Chronic kidney disease, stage 3 (moderate): Secondary | ICD-10-CM | POA: Diagnosis not present

## 2014-06-04 DIAGNOSIS — I10 Essential (primary) hypertension: Secondary | ICD-10-CM | POA: Diagnosis not present

## 2014-06-19 DIAGNOSIS — N184 Chronic kidney disease, stage 4 (severe): Secondary | ICD-10-CM | POA: Diagnosis not present

## 2014-06-19 DIAGNOSIS — Z79899 Other long term (current) drug therapy: Secondary | ICD-10-CM | POA: Diagnosis not present

## 2014-06-19 DIAGNOSIS — E10649 Type 1 diabetes mellitus with hypoglycemia without coma: Secondary | ICD-10-CM | POA: Diagnosis present

## 2014-06-19 DIAGNOSIS — E11649 Type 2 diabetes mellitus with hypoglycemia without coma: Secondary | ICD-10-CM | POA: Diagnosis not present

## 2014-06-19 DIAGNOSIS — E162 Hypoglycemia, unspecified: Secondary | ICD-10-CM | POA: Diagnosis not present

## 2014-06-19 DIAGNOSIS — G40909 Epilepsy, unspecified, not intractable, without status epilepticus: Secondary | ICD-10-CM | POA: Diagnosis present

## 2014-06-19 DIAGNOSIS — R4182 Altered mental status, unspecified: Secondary | ICD-10-CM | POA: Diagnosis not present

## 2014-06-19 DIAGNOSIS — T68XXXA Hypothermia, initial encounter: Secondary | ICD-10-CM | POA: Diagnosis not present

## 2014-06-19 DIAGNOSIS — E161 Other hypoglycemia: Secondary | ICD-10-CM | POA: Diagnosis not present

## 2014-06-19 DIAGNOSIS — Z794 Long term (current) use of insulin: Secondary | ICD-10-CM | POA: Diagnosis not present

## 2014-06-19 DIAGNOSIS — I129 Hypertensive chronic kidney disease with stage 1 through stage 4 chronic kidney disease, or unspecified chronic kidney disease: Secondary | ICD-10-CM | POA: Diagnosis present

## 2014-06-19 DIAGNOSIS — R7309 Other abnormal glucose: Secondary | ICD-10-CM | POA: Diagnosis not present

## 2014-06-30 DIAGNOSIS — D631 Anemia in chronic kidney disease: Secondary | ICD-10-CM | POA: Diagnosis not present

## 2014-06-30 DIAGNOSIS — I10 Essential (primary) hypertension: Secondary | ICD-10-CM | POA: Diagnosis not present

## 2014-06-30 DIAGNOSIS — E119 Type 2 diabetes mellitus without complications: Secondary | ICD-10-CM | POA: Diagnosis not present

## 2014-06-30 DIAGNOSIS — N183 Chronic kidney disease, stage 3 (moderate): Secondary | ICD-10-CM | POA: Diagnosis not present

## 2014-08-15 DIAGNOSIS — I10 Essential (primary) hypertension: Secondary | ICD-10-CM | POA: Diagnosis not present

## 2014-08-15 DIAGNOSIS — E119 Type 2 diabetes mellitus without complications: Secondary | ICD-10-CM | POA: Diagnosis not present

## 2014-08-15 DIAGNOSIS — N183 Chronic kidney disease, stage 3 (moderate): Secondary | ICD-10-CM | POA: Diagnosis not present

## 2014-08-30 ENCOUNTER — Encounter (HOSPITAL_COMMUNITY): Payer: Self-pay

## 2014-08-30 ENCOUNTER — Inpatient Hospital Stay (HOSPITAL_COMMUNITY)
Admission: EM | Admit: 2014-08-30 | Discharge: 2014-09-02 | DRG: 638 | Disposition: A | Discharge status: Home / Self Care | Payer: Medicare Other | Attending: Internal Medicine | Admitting: Internal Medicine

## 2014-08-30 DIAGNOSIS — E10649 Type 1 diabetes mellitus with hypoglycemia without coma: Principal | ICD-10-CM | POA: Diagnosis present

## 2014-08-30 DIAGNOSIS — Z79899 Other long term (current) drug therapy: Secondary | ICD-10-CM

## 2014-08-30 DIAGNOSIS — N189 Chronic kidney disease, unspecified: Secondary | ICD-10-CM | POA: Diagnosis not present

## 2014-08-30 DIAGNOSIS — E1122 Type 2 diabetes mellitus with diabetic chronic kidney disease: Secondary | ICD-10-CM | POA: Diagnosis present

## 2014-08-30 DIAGNOSIS — I10 Essential (primary) hypertension: Secondary | ICD-10-CM | POA: Diagnosis present

## 2014-08-30 DIAGNOSIS — E875 Hyperkalemia: Secondary | ICD-10-CM | POA: Diagnosis not present

## 2014-08-30 DIAGNOSIS — M6282 Rhabdomyolysis: Secondary | ICD-10-CM | POA: Diagnosis present

## 2014-08-30 DIAGNOSIS — E11649 Type 2 diabetes mellitus with hypoglycemia without coma: Secondary | ICD-10-CM | POA: Diagnosis not present

## 2014-08-30 DIAGNOSIS — I129 Hypertensive chronic kidney disease with stage 1 through stage 4 chronic kidney disease, or unspecified chronic kidney disease: Secondary | ICD-10-CM | POA: Diagnosis present

## 2014-08-30 DIAGNOSIS — E161 Other hypoglycemia: Secondary | ICD-10-CM | POA: Diagnosis not present

## 2014-08-30 DIAGNOSIS — N182 Chronic kidney disease, stage 2 (mild): Secondary | ICD-10-CM | POA: Diagnosis present

## 2014-08-30 DIAGNOSIS — D649 Anemia, unspecified: Secondary | ICD-10-CM | POA: Diagnosis present

## 2014-08-30 DIAGNOSIS — Z794 Long term (current) use of insulin: Secondary | ICD-10-CM | POA: Diagnosis not present

## 2014-08-30 DIAGNOSIS — D631 Anemia in chronic kidney disease: Secondary | ICD-10-CM | POA: Diagnosis present

## 2014-08-30 DIAGNOSIS — E162 Hypoglycemia, unspecified: Secondary | ICD-10-CM | POA: Diagnosis not present

## 2014-08-30 DIAGNOSIS — E1069 Type 1 diabetes mellitus with other specified complication: Secondary | ICD-10-CM | POA: Diagnosis present

## 2014-08-30 DIAGNOSIS — Z8249 Family history of ischemic heart disease and other diseases of the circulatory system: Secondary | ICD-10-CM | POA: Diagnosis not present

## 2014-08-30 DIAGNOSIS — E1022 Type 1 diabetes mellitus with diabetic chronic kidney disease: Secondary | ICD-10-CM | POA: Diagnosis present

## 2014-08-30 DIAGNOSIS — R7309 Other abnormal glucose: Secondary | ICD-10-CM | POA: Diagnosis not present

## 2014-08-30 DIAGNOSIS — N179 Acute kidney failure, unspecified: Secondary | ICD-10-CM | POA: Diagnosis present

## 2014-08-30 DIAGNOSIS — R569 Unspecified convulsions: Secondary | ICD-10-CM

## 2014-08-30 DIAGNOSIS — G40909 Epilepsy, unspecified, not intractable, without status epilepticus: Secondary | ICD-10-CM | POA: Diagnosis present

## 2014-08-30 LAB — CBG MONITORING, ED
GLUCOSE-CAPILLARY: 190 mg/dL — AB (ref 65–99)
GLUCOSE-CAPILLARY: 132 mg/dL — AB (ref 65–99)

## 2014-08-30 LAB — COMPREHENSIVE METABOLIC PANEL
ALT: 28 U/L (ref 17–63)
ANION GAP: 13 (ref 5–15)
AST: 77 U/L — ABNORMAL HIGH (ref 15–41)
Albumin: 3.2 g/dL — ABNORMAL LOW (ref 3.5–5.0)
Alkaline Phosphatase: 90 U/L (ref 38–126)
BUN: 59 mg/dL — ABNORMAL HIGH (ref 6–20)
CO2: 17 mmol/L — AB (ref 22–32)
Calcium: 7.5 mg/dL — ABNORMAL LOW (ref 8.9–10.3)
Chloride: 108 mmol/L (ref 101–111)
Creatinine, Ser: 5.14 mg/dL — ABNORMAL HIGH (ref 0.61–1.24)
GFR calc Af Amer: 16 mL/min — ABNORMAL LOW (ref >60)
GFR calc non Af Amer: 14 mL/min — ABNORMAL LOW (ref >60)
GLUCOSE: 169 mg/dL — AB (ref 65–99)
Potassium: 5.8 mmol/L — ABNORMAL HIGH (ref 3.5–5.1)
SODIUM: 138 mmol/L (ref 135–145)
TOTAL PROTEIN: 6.5 g/dL (ref 6.5–8.1)
Total Bilirubin: 1.2 mg/dL (ref 0.3–1.2)

## 2014-08-30 LAB — CBC
HEMATOCRIT: 32.4 % — AB (ref 39.0–52.0)
Hemoglobin: 11.1 g/dL — ABNORMAL LOW (ref 13.0–17.0)
MCH: 27.4 pg (ref 26.0–34.0)
MCHC: 34.3 g/dL (ref 30.0–36.0)
MCV: 80 fL (ref 78.0–100.0)
Platelets: 194 10*3/uL (ref 150–400)
RBC: 4.05 MIL/uL — ABNORMAL LOW (ref 4.22–5.81)
RDW: 13 % (ref 11.5–15.5)
WBC: 7.8 10*3/uL (ref 4.0–10.5)

## 2014-08-30 LAB — RETICULOCYTES
RBC.: 3.74 MIL/uL — AB (ref 4.22–5.81)
Retic Count, Absolute: 29.9 10*3/uL (ref 19.0–186.0)
Retic Ct Pct: 0.8 % (ref 0.4–3.1)

## 2014-08-30 LAB — POTASSIUM: Potassium: 6.2 mmol/L (ref 3.5–5.1)

## 2014-08-30 MED ORDER — ONDANSETRON HCL 4 MG PO TABS: 4.0000 mg | ORAL_TABLET | Freq: Four times a day (QID) | ORAL | Status: DC | PRN

## 2014-08-30 MED ORDER — HYDROMORPHONE HCL 1 MG/ML IJ SOLN: 0.5000 mg | INTRAMUSCULAR | Status: DC | PRN

## 2014-08-30 MED ORDER — ACETAMINOPHEN 325 MG PO TABS: 650.0000 mg | ORAL_TABLET | Freq: Four times a day (QID) | ORAL | Status: DC | PRN

## 2014-08-30 MED ORDER — SODIUM CHLORIDE 0.9 % IV BOLUS (SEPSIS)
500.0000 mL | Freq: Once | INTRAVENOUS | Status: AC
  Administered 2014-08-30: 500 mL via INTRAVENOUS

## 2014-08-30 MED ORDER — INSULIN ASPART 100 UNIT/ML IV SOLN
6.0000 [IU] | Freq: Once | INTRAVENOUS | Status: DC
  Filled 2014-08-30: qty 0.06

## 2014-08-30 MED ORDER — LEVETIRACETAM 500 MG PO TABS
500.0000 mg | ORAL_TABLET | Freq: Two times a day (BID) | ORAL | Status: DC
  Administered 2014-08-30 – 2014-09-02 (×6): 500 mg via ORAL
  Filled 2014-08-30 (×6): qty 1

## 2014-08-30 MED ORDER — DEXTROSE 50 % IV SOLN: 1.0000 | Freq: Once | INTRAVENOUS | Status: DC

## 2014-08-30 MED ORDER — SODIUM POLYSTYRENE SULFONATE 15 GM/60ML PO SUSP
30.0000 g | Freq: Once | ORAL | Status: AC
  Administered 2014-08-30: 30 g via ORAL
  Filled 2014-08-30: qty 120

## 2014-08-30 MED ORDER — INSULIN ASPART 100 UNIT/ML ~~LOC~~ SOLN
0.0000 [IU] | SUBCUTANEOUS | Status: DC
  Administered 2014-08-30: 3 [IU] via SUBCUTANEOUS
  Administered 2014-08-31: 2 [IU] via SUBCUTANEOUS
  Administered 2014-08-31: 1 [IU] via SUBCUTANEOUS
  Administered 2014-08-31: 5 [IU] via SUBCUTANEOUS
  Administered 2014-08-31: 2 [IU] via SUBCUTANEOUS
  Administered 2014-08-31: 5 [IU] via SUBCUTANEOUS
  Administered 2014-09-01: 2 [IU] via SUBCUTANEOUS
  Administered 2014-09-01: 7 [IU] via SUBCUTANEOUS
  Administered 2014-09-01: 1 [IU] via SUBCUTANEOUS
  Administered 2014-09-01: 2 [IU] via SUBCUTANEOUS
  Administered 2014-09-02: 3 [IU] via SUBCUTANEOUS

## 2014-08-30 MED ORDER — ONDANSETRON HCL 4 MG/2ML IJ SOLN: 4.0000 mg | Freq: Four times a day (QID) | INTRAMUSCULAR | Status: DC | PRN

## 2014-08-30 MED ORDER — ENOXAPARIN SODIUM 30 MG/0.3ML ~~LOC~~ SOLN
30.0000 mg | Freq: Every day | SUBCUTANEOUS | Status: DC
  Administered 2014-08-30: 30 mg via SUBCUTANEOUS
  Filled 2014-08-30: qty 0.3

## 2014-08-30 MED ORDER — SODIUM CHLORIDE 0.9 % IV SOLN
INTRAVENOUS | Status: DC
Start: 2014-08-30 — End: 2014-08-31
  Administered 2014-08-30: 22:00:00 via INTRAVENOUS

## 2014-08-30 MED ORDER — ALUM & MAG HYDROXIDE-SIMETH 200-200-20 MG/5ML PO SUSP: 30.0000 mL | Freq: Four times a day (QID) | ORAL | Status: DC | PRN

## 2014-08-30 MED ORDER — HYDRALAZINE HCL 20 MG/ML IJ SOLN
5.0000 mg | Freq: Four times a day (QID) | INTRAMUSCULAR | Status: DC | PRN
  Administered 2014-08-31 – 2014-09-02 (×3): 5 mg via INTRAVENOUS
  Filled 2014-08-30 (×3): qty 1

## 2014-08-30 MED ORDER — CARVEDILOL 12.5 MG PO TABS: 12.5000 mg | ORAL_TABLET | Freq: Two times a day (BID) | ORAL | Status: DC

## 2014-08-30 MED ORDER — OXYCODONE HCL 5 MG PO TABS: 5.0000 mg | ORAL_TABLET | ORAL | Status: DC | PRN

## 2014-08-30 MED ORDER — SODIUM CHLORIDE 0.9 % IV SOLN: INTRAVENOUS | Status: DC

## 2014-08-30 MED ORDER — SODIUM BICARBONATE 8.4 % IV SOLN
50.0000 meq | Freq: Once | INTRAVENOUS | Status: AC
  Administered 2014-08-30: 50 meq via INTRAVENOUS
  Filled 2014-08-30: qty 50

## 2014-08-30 MED ORDER — CARVEDILOL 12.5 MG PO TABS
12.5000 mg | ORAL_TABLET | Freq: Two times a day (BID) | ORAL | Status: DC
  Administered 2014-08-31 – 2014-09-02 (×5): 12.5 mg via ORAL
  Filled 2014-08-30 (×5): qty 1

## 2014-08-30 MED ORDER — SODIUM CHLORIDE 0.9 % IV SOLN
1.0000 g | Freq: Once | INTRAVENOUS | Status: AC
  Administered 2014-08-30: 1 g via INTRAVENOUS
  Filled 2014-08-30: qty 10

## 2014-08-30 MED ORDER — ACETAMINOPHEN 650 MG RE SUPP: 650.0000 mg | Freq: Four times a day (QID) | RECTAL | Status: DC | PRN

## 2014-08-30 MED ORDER — SODIUM CHLORIDE 0.9 % IJ SOLN: 3.0000 mL | Freq: Two times a day (BID) | INTRAMUSCULAR | Status: DC

## 2014-08-30 NOTE — ED Notes (Signed)
CBG 132. 

## 2014-08-30 NOTE — ED Provider Notes (Signed)
CSN: [id number]     Arrival date & time 08/30/14  1550 History   First MD Initiated Contact with Patient 08/30/14 1556     Chief Complaint  Patient presents with  . Hypoglycemia     (Consider location/radiation/quality/duration/timing/severity/associated sxs/prior Treatment) Patient is a 34 y.o. male presenting with hypoglycemia. The history is provided by the patient and the EMS personnel.  Hypoglycemia Associated symptoms: no shortness of breath and no vomiting   Patient w hx iddm, presents via ems w hypoglycemic episode. Pt had taken his normal insulin this AM, had eaten breakfast, but no lunch. Pt noted w altered mental status and blood sugar 28. Ems gave glucagon, and then amp of D50 - repeat blood sugar in 200's w improvement in mental status. Pt notes recent health at baseline. Was asymptomatic earlier today. No fever or chills. No nvd. No recent wt loss. Has glucometer. No recent change in insulin dose or other meds.      Past Medical History  Diagnosis Date  . Diabetes mellitus without complication   . Hypertension   . Seizures    No past surgical history on file. Family History  Problem Relation Age of Onset  . Hypertension Mother   . Hypertension Father    History  Substance Use Topics  . Smoking status: Never Smoker   . Smokeless tobacco: Never Used  . Alcohol Use: No    Review of Systems  Constitutional: Negative for fever and chills.  HENT: Negative for sore throat.   Eyes: Negative for redness.  Respiratory: Negative for shortness of breath.   Cardiovascular: Negative for chest pain.  Gastrointestinal: Negative for vomiting, abdominal pain and diarrhea.  Endocrine: Negative for polyuria.  Genitourinary: Negative for dysuria and flank pain.  Musculoskeletal: Negative for back pain and neck pain.  Skin: Negative for rash.  Neurological: Negative for headaches.  Hematological: Does not bruise/bleed easily.  Psychiatric/Behavioral: Negative for confusion.       Allergies  Review of patient's allergies indicates no known allergies.  Home Medications   Prior to Admission medications   Medication Sig Start Date End Date Taking? Authorizing Provider  carvedilol (COREG) 12.5 MG tablet Take 1 tablet (12.5 mg total) by mouth 2 (two) times daily with a meal. 06/04/13   Barton Dubois, MD  furosemide (LASIX) 20 MG tablet Take 1 tablet (20 mg total) by mouth daily. 06/04/13   Barton Dubois, MD  insulin glargine (LANTUS) 100 UNIT/ML injection Inject 0.3 mLs (30 Units total) into the skin every morning. 06/04/13   Barton Dubois, MD  insulin lispro (HUMALOG) 100 UNIT/ML injection Inject 2-10 Units into the skin 3 (three) times daily before meals.    Historical Provider, MD  levETIRAcetam (KEPPRA) 500 MG tablet Take 1 tablet (500 mg total) by mouth 2 (two) times daily. 06/04/13   Barton Dubois, MD   BP 170/92 mmHg  Pulse 66  Resp 16  SpO2 100% Physical Exam  Constitutional: He is oriented to person, place, and time. He appears well-developed and well-nourished. No distress.  HENT:  Head: Atraumatic.  Mouth/Throat: Oropharynx is clear and moist.  Eyes: Conjunctivae are normal. Pupils are equal, round, and reactive to light. No scleral icterus.  Neck: Neck supple. No tracheal deviation present.  Cardiovascular: Normal rate, regular rhythm, normal heart sounds and intact distal pulses.   Pulmonary/Chest: Effort normal and breath sounds normal. No accessory muscle usage. No respiratory distress.  Abdominal: Soft. Bowel sounds are normal. He exhibits no distension. There is no tenderness.  Genitourinary:  No cva/flank tenderness.   Musculoskeletal: Normal range of motion. He exhibits no edema or tenderness.  Neurological: He is alert and oriented to person, place, and time.  Motor intact bil. stre 5/5. sens grossly intact.   Skin: Skin is warm and dry. No rash noted. He is not diaphoretic.  Psychiatric: He has a normal mood and affect.  Nursing note and  vitals reviewed.   ED Course  Procedures (including critical care time) Labs Review  Results for orders placed or performed during the hospital encounter of 08/30/14  CBC  Result Value Ref Range   WBC 7.8 4.0 - 10.5 K/uL   RBC 4.05 (L) 4.22 - 5.81 MIL/uL   Hemoglobin 11.1 (L) 13.0 - 17.0 g/dL   HCT 32.4 (L) 39.0 - 52.0 %   MCV 80.0 78.0 - 100.0 fL   MCH 27.4 26.0 - 34.0 pg   MCHC 34.3 30.0 - 36.0 g/dL   RDW 13.0 11.5 - 15.5 %   Platelets 194 150 - 400 K/uL  Comprehensive metabolic panel  Result Value Ref Range   Sodium 138 135 - 145 mmol/L   Potassium 5.8 (H) 3.5 - 5.1 mmol/L   Chloride 108 101 - 111 mmol/L   CO2 17 (L) 22 - 32 mmol/L   Glucose, Bld 169 (H) 65 - 99 mg/dL   BUN 59 (H) 6 - 20 mg/dL   Creatinine, Ser 5.14 (H) 0.61 - 1.24 mg/dL   Calcium 7.5 (L) 8.9 - 10.3 mg/dL   Total Protein 6.5 6.5 - 8.1 g/dL   Albumin 3.2 (L) 3.5 - 5.0 g/dL   AST 77 (H) 15 - 41 U/L   ALT 28 17 - 63 U/L   Alkaline Phosphatase 90 38 - 126 U/L   Total Bilirubin 1.2 0.3 - 1.2 mg/dL   GFR calc non Af Amer 14 (L) >60 mL/min   GFR calc Af Amer 16 (L) >60 mL/min   Anion gap 13 5 - 15  POC CBG, ED  Result Value Ref Range   Glucose-Capillary 190 (H) 65 - 99 mg/dL  POC CBG, ED  Result Value Ref Range   Glucose-Capillary 132 (H) 65 - 99 mg/dL      ED ECG REPORT   Date: 08/30/2014  Rate: 71  Rhythm: normal sinus rhythm  QRS Axis: normal  Intervals: normal  ST/T Wave abnormalities: nonspecific ST changes  Conduction Disutrbances:none  Narrative Interpretation:   Old EKG Reviewed: unchanged  I have personally reviewed the EKG tracing     MDM   Iv ns. Continuous pulse ox and monitor. o2 [place]. Labs.   Cbg.  Reviewed nursing notes and prior charts for additional history.   Meal.  Potassium level is high, w acutely worsening renal fxn as compared to prior labs/hx CRI.  Pt has had d50, took insulin today.  hco3 iv. Ca gluconate iv. Kayexalate po.  Repeat k ordered.   Medical  service contacted for admission.  Recheck bs normal.  CRITICAL CARE  RE:  Altered mental status, severe hypoglycemia, hyperkalemia, AKI superimposed on hx CRI.  Performed by: Mirna Mires Total critical care time: 35 Critical care time was exclusive of separately billable procedures and treating other patients. Critical care was necessary to treat or prevent imminent or life-threatening deterioration. Critical care was time spent personally by me on the following activities: development of treatment plan with patient and/or surrogate as well as nursing, discussions with consultants, evaluation of patient's response to treatment, examination of patient, obtaining  history from patient or surrogate, ordering and performing treatments and interventions, ordering and review of laboratory studies, ordering and review of radiographic studies, pulse oximetry and re-evaluation of patient's condition.    Lajean Saver, MD 08/30/14 Vernelle Emerald

## 2014-08-30 NOTE — ED Notes (Signed)
He remains in no distress and is awake, alert and oriented x 4 with clear speech.

## 2014-08-30 NOTE — ED Notes (Signed)
CBG 190

## 2014-08-30 NOTE — ED Notes (Signed)
Pt request to wait awhile until he heat up before checking rectal temperature. Nurse info pt we could wait before checking tem

## 2014-08-30 NOTE — ED Notes (Signed)
Bed: RESB Expected date: 08/30/14 Expected time: 3:43 PM Means of arrival: Ambulance Comments: Hypoglycemia 28 treated remains slow to respond mentally

## 2014-08-30 NOTE — ED Notes (Signed)
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 To "pass out".  EMS arrived and found pt. To have cbg of 28.  After I.M. Glucagon and IV dextrose; his cbg was over 200.  He arrives drowsy and in no distress.

## 2014-08-30 NOTE — ED Notes (Signed)
As I write this, he has eaten a [place] sandwich with orange juice and remains in no distress.

## 2014-08-30 NOTE — H&P (Addendum)
[facility] Admission History and Physical       Edward Colon [id number] DOB: [birth year] DOA: 08/30/2014  Referring physician: EDP PCP: [name], MD  Specialists:   Chief Complaint: Low Blood Sugar   HPI: Edward Colon is a 34 y.o. male with a history of Type I DM, HTN, Seizure disorder who was sent to the ED after suffering an episode of hypoglycemia and passing out at work.  EMS was called and found his glucose level to be in the 20's.   He was administered IV Dextrose.   In the ED , he was evaluated and found to have an elevated BUN/Cr and hyperkalemia and was referred from medical admission.   Patient reports havign several episode of hypoglycemia and recently had his Lantus Insulin decreased from 35 units down to 30 units.   He reports that he did eat breakfast and had snacks today.          Review of Systems: Constitutional: No Weight Loss, No Weight Gain, Night Sweats, Fevers, Chills, Dizziness, Light Headedness, Fatigue, or Generalized Weakness HEENT: No Headaches, Difficulty Swallowing,Tooth/Dental Problems,Sore Throat,  No Sneezing, Rhinitis, Ear Ache, Nasal Congestion, or Post Nasal Drip,  Cardio-vascular:  No Chest pain, Orthopnea, PND, Edema in Lower Extremities, Anasarca, Dizziness, Palpitations  Resp: No Dyspnea, No DOE, No Productive Cough, No Non-Productive Cough, No Hemoptysis, No Wheezing.    GI: No Heartburn, Indigestion, Abdominal Pain, Nausea, Vomiting, Diarrhea, Constipation, Hematemesis, Hematochezia, Melena, Change in Bowel Habits,  Loss of Appetite  GU: No Dysuria, No Change in Color of Urine, No Urgency or Urinary Frequency, No Flank pain.  Musculoskeletal: No Joint Pain or Swelling, No Decreased Range of Motion, No Back Pain.  Neurologic: +Syncope, No Seizures, Muscle Weakness, Paresthesia, Vision Disturbance or Loss, No Diplopia, No Vertigo, No Difficulty Walking,  Skin: No Rash or Lesions. Psych: No Change in Mood or Affect,  No Depression or Anxiety, No Memory loss, No Confusion, or Hallucinations   Past Medical History  Diagnosis Date  . Diabetes mellitus without complication   . Hypertension   . Seizures      No past surgical history on file.    Prior to Admission medications   Medication Sig Start Date End Date Taking? Authorizing Provider  carvedilol (COREG) 12.5 MG tablet Take 1 tablet (12.5 mg total) by mouth 2 (two) times daily with a meal. 06/04/13  Yes Barton Dubois, MD  furosemide (LASIX) 20 MG tablet Take 1 tablet (20 mg total) by mouth daily. 06/04/13  Yes Barton Dubois, MD  insulin glargine (LANTUS) 100 UNIT/ML injection Inject 0.3 mLs (30 Units total) into the skin every morning. 06/04/13  Yes Barton Dubois, MD  insulin lispro (HUMALOG) 100 UNIT/ML injection Inject 2-10 Units into the skin 3 (three) times daily as needed for high blood sugar (high blood sugar).    Yes Historical Provider, MD  levETIRAcetam (KEPPRA) 500 MG tablet Take 1 tablet (500 mg total) by mouth 2 (two) times daily. Patient not taking: Reported on 08/30/2014 06/04/13   Barton Dubois, MD     No Known Allergies  Social History:  reports that he has never smoked. He has never used smokeless tobacco. He reports that he does not drink alcohol or use illicit drugs.    Family History  Problem Relation Age of Onset  . Hypertension Mother   . Hypertension Father        Physical Exam:  GEN:  Pleasant  Well Nourished and Well Developed 34 y.o. African  American male examined and in no acute distress; cooperative with exam Filed Vitals:   08/30/14 1900 08/30/14 2000 08/30/14 2030 08/30/14 2100  BP: 165/106 188/104 164/90 157/92  Pulse: 73 77 76 76  Temp:      TempSrc:      Resp: 14 9 13 14   SpO2: 100% 100% 100% 100%   Blood pressure 157/92, pulse 76, temperature 97 F (36.1 C), temperature source Oral, resp. rate 14, SpO2 100 %. PSYCH: He is alert and oriented x4; does not appear anxious does not appear depressed; affect  is normal HEENT: Normocephalic and Atraumatic, Mucous membranes pink; PERRLA; EOM intact; Fundi:  Benign;  No scleral icterus, Nares: Patent, Oropharynx: Clear, Fair Dentition,    Neck:  FROM, No Cervical Lymphadenopathy nor Thyromegaly or Carotid Bruit; No JVD; Breasts:: Not examined CHEST WALL: No tenderness CHEST: Normal respiration, clear to auscultation bilaterally HEART: Regular rate and rhythm; no murmurs rubs or gallops BACK: No kyphosis or scoliosis; No CVA tenderness ABDOMEN: Positive Bowel Sounds, Soft Non-Tender, No Rebound or Guarding; No Masses, No Organomegaly Rectal Exam: Not done EXTREMITIES: No Cyanosis, Clubbing, or Edema; No Ulcerations. Genitalia: not examined PULSES: 2+ and symmetric SKIN: Normal hydration no rash or ulceration CNS:  Alert and Oriented x 4, No Focal Deficits Vascular: pulses palpable throughout    Labs on Admission:  Basic Metabolic Panel:  Recent Labs Lab 08/30/14 1737 08/30/14 1920  NA 138  --   K 5.8* 6.2*  CL 108  --   CO2 17*  --   GLUCOSE 169*  --   BUN 59*  --   CREATININE 5.14*  --   CALCIUM 7.5*  --    Liver Function Tests:  Recent Labs Lab 08/30/14 1737  AST 77*  ALT 28  ALKPHOS 90  BILITOT 1.2  PROT 6.5  ALBUMIN 3.2*   No results for input(s): LIPASE, AMYLASE in the last 168 hours. No results for input(s): AMMONIA in the last 168 hours. CBC:  Recent Labs Lab 08/30/14 1737  WBC 7.8  HGB 11.1*  HCT 32.4*  MCV 80.0  PLT 194   Cardiac Enzymes: No results for input(s): CKTOTAL, CKMB, CKMBINDEX, TROPONINI in the last 168 hours.  BNP (last 3 results) No results for input(s): BNP in the last 8760 hours.  ProBNP (last 3 results) No results for input(s): PROBNP in the last 8760 hours.  CBG:  Recent Labs Lab 08/30/14 1605 08/30/14 1809  GLUCAP 190* 132*    Radiological Exams on Admission: No results found.   EKG: Independently reviewed. Normal sinus Rhythm Rate =71, No Acute S-T changes, + early  Repolarization Changes present   Assessment/Plan:   35 y.o. male with  Principal Problem:   1.   Hypoglycemia   Monitor Glucose Levels   Hold Lantus Insulin and Adjust Dosage/Decrease    Lantus when glucose is stable    Follow hypoglycemic Protocol PRN   Active Problems:   2.   AKI/ARF (acute renal failure)   IVFs   Monitor BUN/Cr    Baseline Cr = 2.5     3.   Hyperkalemia- K+ =5.8   Given Kayexalate 30 grams PO x 1   Monitor K+ Level   Cardiac monitoring     4.   Type 1 diabetes   Hold Lantus Insulin    And further adjust/decrease dosage   SSI coverage PRN     5.   Hypertension   IV Hydralazine PRN   Continue Carvedilol BID  Monitor BPs     6.   Seizure    Continue Keppra Rx       7.   CKD Stage II    baseline Cr = 2.5     8.   Anemia   Send Anemia panel     9.   DVT Prophylaxis   Lovenox         Code Status:     FULL CODE        Family Communication:   No Family Present    Disposition Plan:    Inpatient Status        Time spent:  [facility] Pager [phone]   If [place] Please Contact the Day Rounding Team MD for [facility]  If 7PM-7AM, Please Contact Night-Floor Coverage  [email or web address] Password [email or web address] 08/30/2014, 9:18 PM     ADDENDUM:   Patient was seen and examined on 08/30/2014

## 2014-08-30 NOTE — ED Notes (Signed)
EMS IV is non-functional, and two nurses attempt IV stick without success.  Dr. Ashok Cordia notified; and my colleague, Manuela Schwartz is attempting u/s guided IV as I write this.

## 2014-08-30 NOTE — ED Notes (Signed)
He is awake and declines rectal temperature.  We have attempted lab draw---no success--will attempt again shortly.

## 2014-08-31 ENCOUNTER — Encounter (HOSPITAL_COMMUNITY): Payer: Self-pay | Admitting: *Deleted

## 2014-08-31 DIAGNOSIS — M6282 Rhabdomyolysis: Secondary | ICD-10-CM

## 2014-08-31 DIAGNOSIS — E162 Hypoglycemia, unspecified: Secondary | ICD-10-CM

## 2014-08-31 DIAGNOSIS — D649 Anemia, unspecified: Secondary | ICD-10-CM

## 2014-08-31 DIAGNOSIS — R569 Unspecified convulsions: Secondary | ICD-10-CM

## 2014-08-31 DIAGNOSIS — I10 Essential (primary) hypertension: Secondary | ICD-10-CM

## 2014-08-31 DIAGNOSIS — E875 Hyperkalemia: Secondary | ICD-10-CM

## 2014-08-31 LAB — CBC
HCT: 28.6 % — ABNORMAL LOW (ref 39.0–52.0)
HEMOGLOBIN: 9.4 g/dL — AB (ref 13.0–17.0)
MCH: 26.9 pg (ref 26.0–34.0)
MCHC: 32.9 g/dL (ref 30.0–36.0)
MCV: 81.9 fL (ref 78.0–100.0)
Platelets: 227 10*3/uL (ref 150–400)
RBC: 3.49 MIL/uL — ABNORMAL LOW (ref 4.22–5.81)
RDW: 13 % (ref 11.5–15.5)
WBC: 4.7 10*3/uL (ref 4.0–10.5)

## 2014-08-31 LAB — BASIC METABOLIC PANEL
ANION GAP: 11 (ref 5–15)
BUN: 51 mg/dL — ABNORMAL HIGH (ref 6–20)
CALCIUM: 7.4 mg/dL — AB (ref 8.9–10.3)
CO2: 24 mmol/L (ref 22–32)
Chloride: 106 mmol/L (ref 101–111)
Creatinine, Ser: 4.62 mg/dL — ABNORMAL HIGH (ref 0.61–1.24)
GFR calc Af Amer: 18 mL/min — ABNORMAL LOW (ref >60)
GFR calc non Af Amer: 15 mL/min — ABNORMAL LOW (ref >60)
Glucose, Bld: 120 mg/dL — ABNORMAL HIGH (ref 65–99)
POTASSIUM: 3.6 mmol/L (ref 3.5–5.1)
SODIUM: 141 mmol/L (ref 135–145)

## 2014-08-31 LAB — CK: CK TOTAL: 2114 U/L — AB (ref 49–397)

## 2014-08-31 LAB — MRSA PCR SCREENING: MRSA BY PCR: NEGATIVE

## 2014-08-31 LAB — IRON AND TIBC
Iron: 113 ug/dL (ref 45–182)
Saturation Ratios: 47 % — ABNORMAL HIGH (ref 17.9–39.5)
TIBC: 238 ug/dL — AB (ref 250–450)
UIBC: 125 ug/dL

## 2014-08-31 LAB — GLUCOSE, CAPILLARY
GLUCOSE-CAPILLARY: 222 mg/dL — AB (ref 65–99)
GLUCOSE-CAPILLARY: 115 mg/dL — AB (ref 65–99)
GLUCOSE-CAPILLARY: 272 mg/dL — AB (ref 65–99)
GLUCOSE-CAPILLARY: 131 mg/dL — AB (ref 65–99)
Glucose-Capillary: 102 mg/dL — ABNORMAL HIGH (ref 65–99)
Glucose-Capillary: 166 mg/dL — ABNORMAL HIGH (ref 65–99)
Glucose-Capillary: 197 mg/dL — ABNORMAL HIGH (ref 65–99)
Glucose-Capillary: 274 mg/dL — ABNORMAL HIGH (ref 65–99)

## 2014-08-31 LAB — FERRITIN: Ferritin: 141 ng/mL (ref 24–336)

## 2014-08-31 LAB — FOLATE: FOLATE: 18.1 ng/mL (ref >5.9)

## 2014-08-31 LAB — VITAMIN B12: VITAMIN B 12: 656 pg/mL (ref 180–914)

## 2014-08-31 MED ORDER — SODIUM CHLORIDE 0.9 % IV BOLUS (SEPSIS)
1000.0000 mL | Freq: Once | INTRAVENOUS | Status: AC
  Administered 2014-08-31: 1000 mL via INTRAVENOUS

## 2014-08-31 MED ORDER — SODIUM CHLORIDE 0.9 % IV SOLN
INTRAVENOUS | Status: DC
  Administered 2014-08-31: 150 mL/h via INTRAVENOUS
  Administered 2014-08-31 – 2014-09-02 (×6): via INTRAVENOUS

## 2014-08-31 MED ORDER — HEPARIN SODIUM (PORCINE) 5000 UNIT/ML IJ SOLN
5000.0000 [IU] | Freq: Three times a day (TID) | INTRAMUSCULAR | Status: DC
  Administered 2014-09-01 (×2): 5000 [IU] via SUBCUTANEOUS
  Filled 2014-08-31 (×5): qty 1

## 2014-08-31 NOTE — Progress Notes (Signed)
Patient arrived on the unit at approximately 2130. He is alert x 4 and verbally responsive. Blood sugar at 2200 was 222. Received 3 units of Novolog. Blood sugars at 0000 and 0456 were 102 and 115 respectively and did not require coverage. Will continue q4h CBGs as ordered. MRSA swab by PCR was negative. Awaiting stool sample for occult blood. Potassium was 3.6 this AM

## 2014-08-31 NOTE — Progress Notes (Signed)
Patient Demographics  Edward Colon, is a 34 y.o. male, DOB - [birth year], [id number]  Admit date - 08/30/2014   Admitting Physician Theressa Millard, MD  Outpatient Primary MD for the patient is [name], MD  LOS - 1   Chief Complaint  Patient presents with  . 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   Assessment & Plan    Principal Problem:   Hypoglycemia Active Problems:   Hypertension   ARF (acute renal failure)   Hyperkalemia   Seizure   Type 1 diabetes   CKD stage 2 due to type 2 diabetes mellitus   Anemia  Hypoglycemia - Patient reports recurrent episodes of hypoglycemia averaging one every couple weeks, hemoglobin A1c, will continue to hold Lantus, very likely will need lower dose on discharge.  Acute on chronic renal failure - Baseline creatinine is 2.5, creatinine was 5.1 on admission, patient has elevated total CK, his renal failure secondary due to rhabdomyolysis patient reports strenuous physical activity in hot weather. -Continue with IV fluids, recheck creatinine and total CK in a.m.  Hyperkalemia - Resolved  Type 1 diabetes mellitus - Continue to hold Lantus, continue with insulin sliding scale  Hypertension - Continue with Coreg  Seizures - Continue with Keppra  Anemia - normal iron studies, B-12 and folate within normal limit ,most likely related to chronic  kidney disease  Code Status: full  Family Communication: none at bedside  Disposition Plan: home when stable   Procedures  None.   Consults   none   Medications  Scheduled Meds: . carvedilol  12.5 mg Oral BID WC  . enoxaparin (LOVENOX) injection  30 mg Subcutaneous QHS  . insulin aspart  0-9 Units Subcutaneous 6 times per day  . levETIRAcetam  500 mg Oral BID  . sodium chloride  3 mL Intravenous Q12H   Continuous Infusions: . sodium chloride     PRN Meds:.acetaminophen **OR** acetaminophen, alum & mag hydroxide-simeth, hydrALAZINE, HYDROmorphone (DILAUDID) injection, ondansetron **OR** ondansetron (ZOFRAN) IV, oxyCODONE  DVT Prophylaxis   Heparin -  Lab Results  Component Value Date   PLT 227 08/31/2014    Antibiotics    Anti-infectives    None          Objective:   Filed Vitals:   08/30/14 2030 08/30/14 2100 08/30/14 2130 08/31/14 0447  BP: 164/90 157/92 167/98 154/79  Pulse: 76 76 76 80  Temp:   98.6 F (37 C) 98.2 F (36.8 C)  TempSrc:   Oral Oral  Resp: 13 14 16 16   Height:   5\' 11"  (1.803 m)   Weight:   94.348 kg (208 lb)   SpO2: 100% 100% 100% 100%    Wt Readings from Last 3 Encounters:  08/30/14 94.348 kg (208 lb)  06/04/13 99.927 kg (220 lb 4.8 oz)  11/06/12 90.719 kg (200 lb)     Intake/Output Summary (Last 24 hours) at 08/31/14 1246 Last data filed at 08/31/14 [id number]  Gross per 24 hour  Intake  805.9 ml  Output   1025 ml  Net -219.1 ml     Physical Exam  Awake Alert, Oriented X 3, No new F.N deficits, Normal affect [place].AT,PERRAL Supple Neck,No JVD, No cervical lymphadenopathy appriciated.  Symmetrical Chest wall movement, Good air movement bilaterally, CTAB RRR,No Gallops,Rubs or new Murmurs, No Parasternal Heave +ve B.Sounds, Abd Soft, No tenderness, No organomegaly appriciated, No rebound - guarding or rigidity. No Cyanosis, Clubbing or edema, No new Rash or bruise     Data Review   Micro Results Recent Results  (from the past 240 hour(s))  MRSA PCR Screening     Status: None   Collection Time: 08/30/14 11:18 PM  Result Value Ref Range Status   MRSA by PCR NEGATIVE NEGATIVE Final    Comment:        The GeneXpert MRSA Assay (FDA approved for NASAL specimens only), is one component of a comprehensive MRSA colonization surveillance program. It is not intended to diagnose MRSA infection nor to guide or monitor treatment for MRSA infections.     Radiology Reports No results found.   CBC  Recent Labs Lab 08/30/14 1737 08/31/14 0442  WBC 7.8 4.7  HGB 11.1* 9.4*  HCT 32.4* 28.6*  PLT 194 227  MCV 80.0 81.9  MCH 27.4 26.9  MCHC 34.3 32.9  RDW 13.0 13.0    Chemistries   Recent Labs Lab 08/30/14 1737 08/30/14 1920 08/31/14 0442  NA 138  --  141  K 5.8* 6.2* 3.6  CL 108  --  106  CO2 17*  --  24  GLUCOSE 169*  --  120*  BUN 59*  --  51*  CREATININE 5.14*  --  4.62*  CALCIUM 7.5*  --  7.4*  AST 77*  --   --   ALT 28  --   --   ALKPHOS 90  --   --   BILITOT 1.2  --   --    ------------------------------------------------------------------------------------------------------------------ estimated creatinine clearance is 26.9 mL/min (by C-G formula based on Cr of 4.62). ------------------------------------------------------------------------------------------------------------------ No results for input(s): HGBA1C in the last 72 hours. ------------------------------------------------------------------------------------------------------------------ No results for input(s): CHOL, HDL, LDLCALC, TRIG, CHOLHDL, LDLDIRECT in the last 72 hours. ------------------------------------------------------------------------------------------------------------------ No results for input(s): TSH, T4TOTAL, T3FREE, THYROIDAB in the last 72 hours.  Invalid input(s):  FREET3 ------------------------------------------------------------------------------------------------------------------  Recent Labs  08/30/14 2340  VITAMINB12 656  FOLATE 18.1  FERRITIN 141  TIBC 238*  IRON 113  RETICCTPCT 0.8    Coagulation profile No results for input(s): INR, PROTIME in the last 168 hours.  No results for input(s): DDIMER in the last 72 hours.  Cardiac Enzymes No results for input(s): CKMB, TROPONINI, MYOGLOBIN in the last 168 hours.  Invalid input(s): CK ------------------------------------------------------------------------------------------------------------------ Invalid input(s): POCBNP     Time Spent in minutes   30 minutes   ELGERGAWY, DAWOOD M.D on 08/31/2014 at 12:46 PM  Between 7am to 7pm - Pager - [phone]  After 7pm go to [email or web address] - password [facility]  [facility]   Office  [phone]

## 2014-09-01 LAB — CBC
HEMATOCRIT: 28 % — AB (ref 39.0–52.0)
Hemoglobin: 9.1 g/dL — ABNORMAL LOW (ref 13.0–17.0)
MCH: 27.1 pg (ref 26.0–34.0)
MCHC: 32.5 g/dL (ref 30.0–36.0)
MCV: 83.3 fL (ref 78.0–100.0)
Platelets: 204 10*3/uL (ref 150–400)
RBC: 3.36 MIL/uL — ABNORMAL LOW (ref 4.22–5.81)
RDW: 13.1 % (ref 11.5–15.5)
WBC: 3.8 10*3/uL — ABNORMAL LOW (ref 4.0–10.5)

## 2014-09-01 LAB — GLUCOSE, CAPILLARY
GLUCOSE-CAPILLARY: 148 mg/dL — AB (ref 65–99)
GLUCOSE-CAPILLARY: 120 mg/dL — AB (ref 65–99)
GLUCOSE-CAPILLARY: 199 mg/dL — AB (ref 65–99)
Glucose-Capillary: 178 mg/dL — ABNORMAL HIGH (ref 65–99)
Glucose-Capillary: 341 mg/dL — ABNORMAL HIGH (ref 65–99)

## 2014-09-01 LAB — BASIC METABOLIC PANEL
Anion gap: 6 (ref 5–15)
BUN: 45 mg/dL — ABNORMAL HIGH (ref 6–20)
CALCIUM: 6.8 mg/dL — AB (ref 8.9–10.3)
CO2: 21 mmol/L — ABNORMAL LOW (ref 22–32)
Chloride: 112 mmol/L — ABNORMAL HIGH (ref 101–111)
Creatinine, Ser: 4.06 mg/dL — ABNORMAL HIGH (ref 0.61–1.24)
GFR calc Af Amer: 21 mL/min — ABNORMAL LOW (ref >60)
GFR calc non Af Amer: 18 mL/min — ABNORMAL LOW (ref >60)
GLUCOSE: 157 mg/dL — AB (ref 65–99)
Potassium: 4.2 mmol/L (ref 3.5–5.1)
SODIUM: 139 mmol/L (ref 135–145)

## 2014-09-01 LAB — HEMOGLOBIN A1C
HEMOGLOBIN A1C: 10.3 % — AB (ref 4.8–5.6)
Mean Plasma Glucose: 249 mg/dL

## 2014-09-01 LAB — CK: CK TOTAL: 1068 U/L — AB (ref 49–397)

## 2014-09-01 MED ORDER — INSULIN GLARGINE 100 UNIT/ML ~~LOC~~ SOLN
10.0000 [IU] | Freq: Every day | SUBCUTANEOUS | Status: DC
  Administered 2014-09-01: 10 [IU] via SUBCUTANEOUS
  Filled 2014-09-01 (×2): qty 0.1

## 2014-09-01 MED ORDER — HYDRALAZINE HCL 25 MG PO TABS
25.0000 mg | ORAL_TABLET | Freq: Three times a day (TID) | ORAL | Status: DC
  Administered 2014-09-01 – 2014-09-02 (×4): 25 mg via ORAL
  Filled 2014-09-01 (×5): qty 1

## 2014-09-01 NOTE — Progress Notes (Signed)
Patient Demographics  Edward Colon, is a 34 y.o. male, DOB - [birth year], [id number]  Admit date - 08/30/2014   Admitting Physician Theressa Millard, MD  Outpatient Primary MD for the patient is [name], MD  LOS - 2   Chief Complaint  Patient presents with  . 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  Subjective:   Edward Colon today has, No headache, No chest pain, No abdominal pain - No Nausea, No new weakness tingling or numbness, No Cough - SOB.   Assessment & Plan    Principal Problem:   Hypoglycemia Active Problems:   Hypertension   ARF (acute renal failure)   Hyperkalemia   Seizure   Type 1 diabetes   CKD stage 2 due to type 2 diabetes mellitus   Anemia   Rhabdomyolysis  Hypoglycemia - Patient reports recurrent episodes of hypoglycemia averaging one every couple weeks, hemoglobin A1c is 10.3, Lantus held on first day , will start on 10 units Lantus subcutaneous daily (patient is on 30 units at home , but likely will be needed to be discharged at a lower dose )  Acute on chronic renal failure - Baseline creatinine is 2.5, creatinine was 5.1 on admission, patient has elevated total CK, his renal failure secondary due to rhabdomyolysis patient reports strenuous physical activity in hot weather. -Continue with IV fluids . - Creatinine is improving, creatinine is for today , CK is trending down , continue with IV fluids at 150 mL/h .  Hyperkalemia - Resolved  Type 1 diabetes  - will  Start on 10 units subcutaneous Lantus daily  -  continue with insulin sliding scale  Hypertension - Continue with Coreg, uncontrolled most likely due to IV fluid, will start on oral hydralazine.  Seizures - Continue with Keppra  Anemia - normal iron studies, B-12 and folate within normal limit ,most likely related to chronic kidney disease  Code Status: full  Family Communication: none at bedside  Disposition Plan: home when stable   Procedures  None.   Consults   none   Medications  Scheduled Meds: . carvedilol  12.5 mg Oral BID WC  . heparin subcutaneous  5,000 Units Subcutaneous 3 times per day  . hydrALAZINE  25 mg Oral TID  . insulin aspart  0-9 Units Subcutaneous 6 times per day  . insulin glargine  10 Units Subcutaneous Daily  . levETIRAcetam  500 mg Oral BID  . sodium chloride  3 mL Intravenous Q12H   Continuous Infusions: . sodium chloride 150 mL/hr at 09/01/14 0636   PRN Meds:.acetaminophen **OR** acetaminophen, alum & mag hydroxide-simeth, hydrALAZINE, HYDROmorphone (DILAUDID) injection, ondansetron **OR** ondansetron (ZOFRAN) IV, oxyCODONE  DVT Prophylaxis   Heparin -  Lab Results  Component Value Date   PLT 204 09/01/2014    Antibiotics    Anti-infectives    None          Objective:  Filed Vitals:   08/31/14 2205 08/31/14 2332 09/01/14 0435 09/01/14 0600  BP: 156/90 154/81 168/93 156/90  Pulse: 83 88 78   Temp:      TempSrc:   Oral   Resp:  20 20   Height:      Weight:      SpO2:  99% 100%     Wt Readings from Last 3 Encounters:  08/30/14 94.348 kg (208 lb)  06/04/13 99.927 kg (220 lb 4.8 oz)  11/06/12 90.719 kg (200 lb)     Intake/Output Summary (Last 24 hours) at 09/01/14 1032 Last data filed at 09/01/14 0300  Gross per 24 hour  Intake   2960 ml  Output   1825 ml  Net   1135 ml     Physical Exam  Awake Alert, Oriented X 3, No new F.N deficits, Normal affect [place].AT,PERRAL Supple Neck,No JVD, No cervical  lymphadenopathy appriciated.  Symmetrical Chest wall movement, Good air movement bilaterally, CTAB RRR,No Gallops,Rubs or new Murmurs, No Parasternal Heave +ve B.Sounds, Abd Soft, No tenderness, No organomegaly appriciated, No rebound - guarding or rigidity. No Cyanosis, Clubbing or edema, No new Rash or bruise     Data Review   Micro Results Recent Results (from the past 240 hour(s))  MRSA PCR Screening     Status: None   Collection Time: 08/30/14 11:18 PM  Result Value Ref Range Status   MRSA by PCR NEGATIVE NEGATIVE Final    Comment:        The GeneXpert MRSA Assay (FDA approved for NASAL specimens only), is one component of a comprehensive MRSA colonization surveillance program. It is not intended to diagnose MRSA infection nor to guide or monitor treatment for MRSA infections.     Radiology Reports No results found.   CBC  Recent Labs Lab 08/30/14 1737 08/31/14 0442 09/01/14 0405  WBC 7.8 4.7 3.8*  HGB 11.1* 9.4* 9.1*  HCT 32.4* 28.6* 28.0*  PLT 194 227 204  MCV 80.0 81.9 83.3  MCH 27.4 26.9 27.1  MCHC 34.3 32.9 32.5  RDW 13.0 13.0 13.1    Chemistries   Recent Labs Lab 08/30/14 1737 08/30/14 1920 08/31/14 0442 09/01/14 0405  NA 138  --  141 139  K 5.8* 6.2* 3.6 4.2  CL 108  --  106 112*  CO2 17*  --  24 21*  GLUCOSE 169*  --  120* 157*  BUN 59*  --  51* 45*  CREATININE 5.14*  --  4.62* 4.06*  CALCIUM 7.5*  --  7.4* 6.8*  AST 77*  --   --   --   ALT 28  --   --   --   ALKPHOS 90  --   --   --   BILITOT 1.2  --   --   --    ------------------------------------------------------------------------------------------------------------------ estimated creatinine clearance is 30.6 mL/min (by C-G formula based on Cr of 4.06). ------------------------------------------------------------------------------------------------------------------  Recent Labs  08/31/14 0442  HGBA1C 10.3*    ------------------------------------------------------------------------------------------------------------------ No results for input(s): CHOL, HDL, LDLCALC, TRIG, CHOLHDL, LDLDIRECT in the last 72 hours. ------------------------------------------------------------------------------------------------------------------ No results for input(s): TSH, T4TOTAL, T3FREE, THYROIDAB in the last 72 hours.  Invalid input(s): FREET3 ------------------------------------------------------------------------------------------------------------------  Recent Labs  08/30/14 2340  VITAMINB12 656  FOLATE 18.1  FERRITIN 141  TIBC 238*  IRON 113  RETICCTPCT 0.8    Coagulation profile No results for input(s): INR, PROTIME in the last 168 hours.  No results for input(s): DDIMER in the last 72  hours.  Cardiac Enzymes No results for input(s): CKMB, TROPONINI, MYOGLOBIN in the last 168 hours.  Invalid input(s): CK ------------------------------------------------------------------------------------------------------------------ Invalid input(s): POCBNP     Time Spent in minutes   30 minutes   Janeil Schexnayder M.D on 09/01/2014 at 10:32 AM  Between 7am to 7pm - Pager - [phone]  After 7pm go to [email or web address] - password [facility]  [facility]   Office  [phone]

## 2014-09-01 NOTE — Care Management (Signed)
IM LETTER GIVEN TO PATIENT  

## 2014-09-01 NOTE — Progress Notes (Signed)
Utilization review completed.  

## 2014-09-02 LAB — BASIC METABOLIC PANEL
ANION GAP: 7 (ref 5–15)
BUN: 39 mg/dL — ABNORMAL HIGH (ref 6–20)
CHLORIDE: 114 mmol/L — AB (ref 101–111)
CO2: 19 mmol/L — ABNORMAL LOW (ref 22–32)
CREATININE: 3.64 mg/dL — AB (ref 0.61–1.24)
Calcium: 7.1 mg/dL — ABNORMAL LOW (ref 8.9–10.3)
GFR calc non Af Amer: 21 mL/min — ABNORMAL LOW (ref >60)
GFR, EST AFRICAN AMERICAN: 24 mL/min — AB (ref >60)
Glucose, Bld: 104 mg/dL — ABNORMAL HIGH (ref 65–99)
Potassium: 4.3 mmol/L (ref 3.5–5.1)
SODIUM: 140 mmol/L (ref 135–145)

## 2014-09-02 LAB — CK: CK TOTAL: 709 U/L — AB (ref 49–397)

## 2014-09-02 LAB — GLUCOSE, CAPILLARY
Glucose-Capillary: 106 mg/dL — ABNORMAL HIGH (ref 65–99)
Glucose-Capillary: 118 mg/dL — ABNORMAL HIGH (ref 65–99)
Glucose-Capillary: 234 mg/dL — ABNORMAL HIGH (ref 65–99)

## 2014-09-02 MED ORDER — INSULIN GLARGINE 100 UNIT/ML ~~LOC~~ SOLN: 15.0000 [IU] | Freq: Every morning | SUBCUTANEOUS | Status: DC

## 2014-09-02 MED ORDER — HYDRALAZINE HCL 25 MG PO TABS: 25.0000 mg | ORAL_TABLET | Freq: Three times a day (TID) | ORAL | Status: DC

## 2014-09-02 MED ORDER — INSULIN GLARGINE 100 UNIT/ML ~~LOC~~ SOLN
15.0000 [IU] | Freq: Every day | SUBCUTANEOUS | Status: DC
  Administered 2014-09-02: 15 [IU] via SUBCUTANEOUS
  Filled 2014-09-02: qty 0.15

## 2014-09-02 NOTE — Discharge Instructions (Signed)
Follow with Primary MD Nolene Ebbs A, MD in 7 days   Get CBC, CMP, 2 view Chest X ray checked  by Primary MD next visit.    Activity: As tolerated with Full fall precautions use walker/cane & assistance as needed   Disposition Home    Diet: Heart Healthy  , with feeding assistance and aspiration precautions.  For Heart failure patients - Check your Weight same time everyday, if you gain over 2 pounds, or you develop in leg swelling, experience more shortness of breath or chest pain, call your Primary MD immediately. Follow Cardiac Low Salt Diet and 1.5 lit/day fluid restriction.   On your next visit with your primary care physician please Get Medicines reviewed and adjusted.   Please request your Prim.MD to go over all Hospital Tests and Procedure/Radiological results at the follow up, please get all Hospital records sent to your Prim MD by signing hospital release before you go home.   If you experience worsening of your admission symptoms, develop shortness of breath, life threatening emergency, suicidal or homicidal thoughts you must seek medical attention immediately by calling 911 or calling your MD immediately  if symptoms less severe.  You Must read complete instructions/literature along with all the possible adverse reactions/side effects for all the Medicines you take and that have been prescribed to you. Take any new Medicines after you have completely understood and accpet all the possible adverse reactions/side effects.   Do not drive, operating heavy machinery, perform activities at heights, swimming or participation in water activities or provide baby sitting services if your were admitted for syncope or siezures until you have seen by Primary MD or a Neurologist and advised to do so again.  Do not drive when taking Pain medications.    Do not take more than prescribed Pain, Sleep and Anxiety Medications  Special Instructions: If you have smoked or chewed Tobacco  in  the last 2 yrs please stop smoking, stop any regular Alcohol  and or any Recreational drug use.  Wear Seat belts while driving.   Please note  You were cared for by a hospitalist during your hospital stay. If you have any questions about your discharge medications or the care you received while you were in the hospital after you are discharged, you can call the unit and asked to speak with the hospitalist on call if the hospitalist that took care of you is not available. Once you are discharged, your primary care physician will handle any further medical issues. Please note that NO REFILLS for any discharge medications will be authorized once you are discharged, as it is imperative that you return to your primary care physician (or establish a relationship with a primary care physician if you do not have one) for your aftercare needs so that they can reassess your need for medications and monitor your lab values.

## 2014-09-02 NOTE — Progress Notes (Signed)
Inpatient Diabetes Program Recommendations  AACE/ADA: New Consensus Statement on Inpatient Glycemic Control (2013)  Target Ranges:  Prepandial:   less than 140 mg/dL      Peak postprandial:   less than 180 mg/dL (1-2 hours)      Critically ill patients:  140 - 180 mg/dL   Reason for Assessment: Elevated HgbA1C  Results for LADARRION, EADY (MRN [id number]) as of 09/02/2014 10:57  Ref. Range 08/31/2014 04:42  Hemoglobin A1C Latest Ref Range: 4.8-5.6 % 10.3 (H)  Results for ABDULELAH, VIVENZIO (MRN [id number]) as of 09/02/2014 10:57  Ref. Range 09/01/2014 16:26 09/01/2014 20:23 09/02/2014 00:21 09/02/2014 04:01 09/02/2014 07:43  Glucose-Capillary Latest Ref Range: 65-99 mg/dL 120 (H) 199 (H) 234 (H) 106 (H) 118 (H)   Inpatient Diabetes Recommendations  Add Novolog HS correction. Titrate Lantus if FBS > 180 mg/dL   Note: Agree in decrease in Lantus from home dose. Will need to f/u with PCP for insulin adjustments when discharged. HgbA1C likely not accurate with low H/H.  Thank you. Lorenda Peck, RD, LDN, CDE Inpatient Diabetes Coordinator [phone]

## 2014-09-02 NOTE — Discharge Summary (Signed)
Edward Colon, is a 34 y.o. male  DOB 08-Apr-1980  MRN [id number].  Admission date:  08/30/2014  Admitting Physician  Theressa Millard, MD  Discharge Date:  09/02/2014   Primary MD  [name], MD  Recommendations for primary care physician for things to follow:  - Check CBC, BMP due next visit. - Patient Lantus dose was lowered, his glycohemoglobin A1c is 10.1 , encouraged to eat snack when at work as it seems most hypoglycemic episodes happen at work with this to his physical activity.   Admission Diagnosis  hypoglycemia    Discharge Diagnosis  hypoglycemia     Principal Problem:   Hypoglycemia Active Problems:   Hypertension   ARF (acute renal failure)   Hyperkalemia   Seizure   Type 1 diabetes   CKD stage 2 due to type 2 diabetes mellitus   Anemia   Rhabdomyolysis      Past Medical History  Diagnosis Date  . Diabetes mellitus without complication   . Hypertension   . Seizures     History reviewed. No pertinent past surgical history.     History of present illness and  Hospital Course:     Kindly see H&P for history of present illness and admission details, please review complete Labs, Consult reports and Test reports for all details in brief  HPI  from the history and physical done on the day of admission on 6/25  Edward Colon is a 34 y.o. male with a history of Type I DM, HTN, Seizure disorder who was sent to the ED after suffering an episode of hypoglycemia and passing out at work. EMS was called and found his glucose level to be in the 20's. He was administered IV Dextrose. In the ED , he was evaluated and found to have an elevated BUN/Cr and hyperkalemia and was referred from medical admission. Patient reports havign several episode of hypoglycemia and recently had his Lantus Insulin decreased from 35 units down to 30 units. He reports that he did eat  breakfast and had snacks today.    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  Hypoglycemia - Patient reports recurrent episodes of hypoglycemia averaging one every couple weeks, hemoglobin A1c is 10.3, - Lantus was lowered to 15 units subcutaneous daily. - Encouraged to eat snack at work to avoid hypoglycemia as his work involves strenous physical activity  Acute on chronic renal failure - baseline  creatinine at nephrology office was 4.63 on 08/15/14, 3.72 on 06/30/14, creatinine was 5.1 on admission, patient has elevated total CK, his renal failure secondary due to rhabdomyolysis patient reports strenuous physical activity in hot weather. -Significantly improved with IV fluids, creatinine is 3.6 on discharge, hold Lasix  on discharge.  Hyperkalemia - Resolved  Type 1 diabetes  - Please see hypoglycemia discussion above  Hypertension - Continue with Coreg, uncontrolled most likely due to IV fluid, started on oral hydralazine.  Seizures - Continue with Keppra  Anemia - normal iron studies, B-12 and folate within normal limit ,most likely related to chronic kidney disease  Discharge Condition:  stable   Follow UP  Follow-up Information    Follow up with AVBUERE,EDWIN A, MD. Schedule an appointment as soon as possible for a visit in 1 week.   Specialty:  Internal Medicine   Why:  post hospitalization follow up   Contact information:   [place] [address] [phone]       Follow up with Louis Meckel, MD.   Specialty:  Nephrology   Why:  keep your appointment   Contact information:   [place] [address] [phone]         Discharge Instructions  and  Discharge Medications     Discharge Instructions    Diet - low sodium heart healthy    Complete by:  As directed      Discharge instructions    Complete by:  As directed   Follow with Primary MD Nolene Ebbs A, MD in 7 days   Get CBC, CMP, 2 view Chest X ray checked  by Primary MD next visit.    Activity: As tolerated with Full fall precautions use walker/cane & assistance as needed   Disposition Home **   Diet: Heart Healthy , with feeding assistance and aspiration precautions.  For Heart failure patients - Check your Weight same time everyday, if you gain over 2 pounds, or you develop in leg swelling, experience more shortness of breath or chest pain, call your Primary MD immediately. Follow Cardiac Low Salt Diet and 1.5 lit/day fluid restriction.   On your next visit with your primary care physician please Get Medicines reviewed and adjusted.   Please request your Prim.MD to go over all Hospital Tests and Procedure/Radiological results at the follow up, please get all Hospital records sent to your Prim MD by signing hospital release before you go home.   If you experience worsening of your admission symptoms, develop shortness of breath, life threatening emergency, suicidal or homicidal thoughts you must seek medical attention immediately by calling 911 or calling your MD immediately  if symptoms less severe.  You Must read complete instructions/literature along with all the possible adverse reactions/side effects for all the Medicines you take and that have been prescribed to you. Take any new Medicines after you have completely understood and accpet all the possible adverse reactions/side effects.   Do not drive, operating heavy machinery, perform activities at heights, swimming or participation in water activities or provide baby sitting services if your were admitted for syncope or siezures  until you have seen by Primary MD or a Neurologist and advised to do so again.  Do not drive when taking Pain medications.    Do not take more than prescribed Pain, Sleep and Anxiety Medications  Special Instructions: If you have smoked or chewed Tobacco  in the last 2 yrs please stop smoking, stop any regular Alcohol  and or any Recreational drug use.  Wear Seat belts while driving.   Please note  You were cared for by a hospitalist during your hospital stay. If you have any questions about your discharge medications or the care you received while you were in the hospital  after you are discharged, you can call the unit and asked to speak with the hospitalist on call if the hospitalist that took care of you is not available. Once you are discharged, your primary care physician will handle any further medical issues. Please note that NO REFILLS for any discharge medications will be authorized once you are discharged, as it is imperative that you return to your primary care physician (or establish a relationship with a primary care physician if you do not have one) for your aftercare needs so that they can reassess your need for medications and monitor your lab values.     Increase activity slowly    Complete by:  As directed             Medication List    STOP taking these medications        furosemide 20 MG tablet  Commonly known as:  LASIX      TAKE these medications        carvedilol 12.5 MG tablet  Commonly known as:  COREG  Take 1 tablet (12.5 mg total) by mouth 2 (two) times daily with a meal.     hydrALAZINE 25 MG tablet  Commonly known as:  APRESOLINE  Take 1 tablet (25 mg total) by mouth 3 (three) times daily.     insulin glargine 100 UNIT/ML injection  Commonly known as:  LANTUS  Inject 0.15 mLs (15 Units total) into the skin every morning.     insulin lispro 100 UNIT/ML injection  Commonly known as:  HUMALOG  Inject 2-10 Units into the skin 3 (three) times  daily as needed for high blood sugar (high blood sugar).     levETIRAcetam 500 MG tablet  Commonly known as:  KEPPRA  Take 1 tablet (500 mg total) by mouth 2 (two) times daily.          Diet and Activity recommendation: See Discharge Instructions above   Consults obtained -  none    Major procedures and Radiology Reports - PLEASE review detailed and final reports for all details, in brief -      No results found.  Micro Results     Recent Results (from the past 240 hour(s))  MRSA PCR Screening     Status: None   Collection Time: 08/30/14 11:18 PM  Result Value Ref Range Status   MRSA by PCR NEGATIVE NEGATIVE Final    Comment:        The GeneXpert MRSA Assay (FDA approved for NASAL specimens only), is one component of a comprehensive MRSA colonization surveillance program. It is not intended to diagnose MRSA infection nor to guide or monitor treatment for MRSA infections.        Today   Subjective:   Edward Colon today has no headache,no chest abdominal pain,no new weakness tingling or numbness, feels much better wants to go home today.   Objective:   Blood pressure 151/88, pulse 78, temperature 98.2 F (36.8 C), temperature source Oral, resp. rate 20, height 5\' 11"  (1.803 m), weight 94.348 kg (208 lb), SpO2 100 %.   Intake/Output Summary (Last 24 hours) at 09/02/14 1113 Last data filed at 09/02/14 1100  Gross per 24 hour  Intake   3197 ml  Output   2875 ml  Net    322 ml    Exam Awake Alert, Oriented x 3, No new F.N deficits, Normal affect [place].AT,PERRAL Supple Neck,No JVD, No cervical lymphadenopathy appriciated.  Symmetrical Chest wall movement, Good air  movement bilaterally, CTAB RRR,No Gallops,Rubs or new Murmurs, No Parasternal Heave +ve B.Sounds, Abd Soft, Non tender, No organomegaly appriciated, No rebound -guarding or rigidity. No Cyanosis, Clubbing or edema, No new Rash or bruise  Data Review   CBC w Diff: Lab Results    Component Value Date   WBC 3.8* 09/01/2014   HGB 9.1* 09/01/2014   HCT 28.0* 09/01/2014   PLT 204 09/01/2014   LYMPHOPCT 28 08/30/2013   MONOPCT 7 08/30/2013   EOSPCT 1 08/30/2013   BASOPCT 0 08/30/2013    CMP: Lab Results  Component Value Date   NA 140 09/02/2014   K 4.3 09/02/2014   CL 114* 09/02/2014   CO2 19* 09/02/2014   BUN 39* 09/02/2014   CREATININE 3.64* 09/02/2014   PROT 6.5 08/30/2014   ALBUMIN 3.2* 08/30/2014   BILITOT 1.2 08/30/2014   ALKPHOS 90 08/30/2014   AST 77* 08/30/2014   ALT 28 08/30/2014  .   Total Time in preparing paper work, data evaluation and todays exam - 35 minutes  Ethelwyn Gilbertson M.D on 09/02/2014 at [place]  [phone]

## 2014-09-02 NOTE — Progress Notes (Signed)
Pt evaluated by MD and discharged home with follow up with his PCP and urology. Pt in agreement of plan. Discharge instructions reviewed with patient and given opportunities for questions. Pt alert and oriented, has no concerns at this time. Pt awaiting ride and clothing from father.

## 2014-09-26 DIAGNOSIS — I10 Essential (primary) hypertension: Secondary | ICD-10-CM | POA: Diagnosis not present

## 2014-09-26 DIAGNOSIS — N529 Male erectile dysfunction, unspecified: Secondary | ICD-10-CM | POA: Diagnosis not present

## 2014-09-26 DIAGNOSIS — N183 Chronic kidney disease, stage 3 (moderate): Secondary | ICD-10-CM | POA: Diagnosis not present

## 2014-09-26 DIAGNOSIS — E119 Type 2 diabetes mellitus without complications: Secondary | ICD-10-CM | POA: Diagnosis not present

## 2014-10-01 DIAGNOSIS — F4320 Adjustment disorder, unspecified: Secondary | ICD-10-CM | POA: Diagnosis not present

## 2014-10-07 ENCOUNTER — Emergency Department (HOSPITAL_COMMUNITY)
Admission: EM | Admit: 2014-10-07 | Discharge: 2014-10-07 | Disposition: A | Discharge status: Home / Self Care | Payer: Medicare Other | Attending: Emergency Medicine | Admitting: Emergency Medicine

## 2014-10-07 ENCOUNTER — Encounter (HOSPITAL_COMMUNITY): Payer: Self-pay

## 2014-10-07 DIAGNOSIS — S61001A Unspecified open wound of right thumb without damage to nail, initial encounter: Secondary | ICD-10-CM | POA: Insufficient documentation

## 2014-10-07 DIAGNOSIS — Y9289 Other specified places as the place of occurrence of the external cause: Secondary | ICD-10-CM | POA: Insufficient documentation

## 2014-10-07 DIAGNOSIS — Z794 Long term (current) use of insulin: Secondary | ICD-10-CM | POA: Insufficient documentation

## 2014-10-07 DIAGNOSIS — S61401A Unspecified open wound of right hand, initial encounter: Secondary | ICD-10-CM | POA: Diagnosis not present

## 2014-10-07 DIAGNOSIS — Y998 Other external cause status: Secondary | ICD-10-CM | POA: Insufficient documentation

## 2014-10-07 DIAGNOSIS — E119 Type 2 diabetes mellitus without complications: Secondary | ICD-10-CM | POA: Insufficient documentation

## 2014-10-07 DIAGNOSIS — IMO0002 Reserved for concepts with insufficient information to code with codable children: Secondary | ICD-10-CM

## 2014-10-07 DIAGNOSIS — S61411A Laceration without foreign body of right hand, initial encounter: Secondary | ICD-10-CM | POA: Diagnosis not present

## 2014-10-07 DIAGNOSIS — W25XXXA Contact with sharp glass, initial encounter: Secondary | ICD-10-CM | POA: Diagnosis not present

## 2014-10-07 DIAGNOSIS — Y9389 Activity, other specified: Secondary | ICD-10-CM | POA: Diagnosis not present

## 2014-10-07 DIAGNOSIS — G40909 Epilepsy, unspecified, not intractable, without status epilepticus: Secondary | ICD-10-CM | POA: Insufficient documentation

## 2014-10-07 DIAGNOSIS — Z79899 Other long term (current) drug therapy: Secondary | ICD-10-CM | POA: Diagnosis not present

## 2014-10-07 DIAGNOSIS — I10 Essential (primary) hypertension: Secondary | ICD-10-CM | POA: Insufficient documentation

## 2014-10-07 DIAGNOSIS — S61011A Laceration without foreign body of right thumb without damage to nail, initial encounter: Secondary | ICD-10-CM | POA: Diagnosis not present

## 2014-10-07 MED ORDER — BACITRACIN 500 UNIT/GM EX OINT
1.0000 "application " | TOPICAL_OINTMENT | Freq: Two times a day (BID) | CUTANEOUS | Status: DC
  Filled 2014-10-07: qty 0.9

## 2014-10-07 MED ORDER — CEPHALEXIN 500 MG PO CAPS: 500.0000 mg | ORAL_CAPSULE | Freq: Four times a day (QID) | ORAL | Status: DC

## 2014-10-07 NOTE — ED Notes (Signed)
Per pt, was setting down aquarium.  Was cut on broken glass on rt thumb and rt hand.

## 2014-10-07 NOTE — ED Provider Notes (Addendum)
CSN: [id number]   Arrival date & time 10/07/14 1849  History  This chart was scribed for  Edward Etienne, DO by Altamease Oiler, ED Scribe. This patient was seen in room WTR8/WTR8 and the patient's care was started at 7:24 PM.  Chief Complaint  Patient presents with  . Extremity Laceration    HPI The history is provided by the patient. No language interpreter was used.   Edward Colon is a 34 y.o. male who presents to the Emergency Department complaining of right thumb and right palm lacerations with onset tonight just PTA. The pt was moving a fish tank that he did not realize was broken and cut his hand. Last tetanus shot was about 3 months ago. Patient has irrigated the wounds prior to arrival. No other treatments tried.  Past Medical History  Diagnosis Date  . Diabetes mellitus without complication   . Hypertension   . Seizures     History reviewed. No pertinent past surgical history.  Family History  Problem Relation Age of Onset  . Hypertension Mother   . Hypertension Father     History  Substance Use Topics  . Smoking status: Never Smoker   . Smokeless tobacco: Never Used  . Alcohol Use: No     Review of Systems  Musculoskeletal:       Right thumb and right palm lacerations     Home Medications   Prior to Admission medications   Medication Sig Start Date End Date Taking? Authorizing Provider  carvedilol (COREG) 12.5 MG tablet Take 1 tablet (12.5 mg total) by mouth 2 (two) times daily with a meal. 06/04/13   Barton Dubois, MD  hydrALAZINE (APRESOLINE) 25 MG tablet Take 1 tablet (25 mg total) by mouth 3 (three) times daily. 09/02/14   Silver Huguenin Elgergawy, MD  insulin glargine (LANTUS) 100 UNIT/ML injection Inject 0.15 mLs (15 Units total) into the skin every morning. 09/02/14   Albertine Patricia, MD  insulin lispro (HUMALOG) 100 UNIT/ML injection Inject 2-10 Units into the skin 3 (three) times daily as needed for high blood sugar (high blood sugar).     Historical  Provider, MD  levETIRAcetam (KEPPRA) 500 MG tablet Take 1 tablet (500 mg total) by mouth 2 (two) times daily. Patient not taking: Reported on 08/30/2014 06/04/13   Barton Dubois, MD    Allergies  Review of patient's allergies indicates no known allergies.  Triage Vitals: BP 185/99 mmHg  Pulse 91  Temp(Src) 98.1 F (36.7 C) (Oral)  Resp 18  Wt 200 lb (90.719 kg)  SpO2 100%  Physical Exam  Constitutional: He is oriented to person, place, and time. He appears well-developed and well-nourished. No distress.  HENT:  Head: Normocephalic and atraumatic.  Eyes: Conjunctivae and EOM are normal.  Neck: Neck supple. No tracheal deviation present.  Cardiovascular: Normal rate and intact distal pulses.   Intact distal pulses, brisk capillary refill  Pulmonary/Chest: Effort normal. No respiratory distress.  Musculoskeletal: Normal range of motion.  Neurological: He is alert and oriented to person, place, and time.  Sensation intact  Skin: Skin is warm and dry.  1 cm skin avulsion to the palmar surface of right hand, with small associated avulsion on right thumb, no foreign body  Psychiatric: He has a normal mood and affect. His behavior is normal.  Nursing note and vitals reviewed.   ED Course  Procedures   DIAGNOSTIC STUDIES: Oxygen Saturation is 100% on RA, normal by my interpretation.    COORDINATION OF CARE:  7:27 PM Discussed treatment plan which includes wound care and laceration repair with pt at bedside and pt agreed to plan.  Labs Review- Labs Reviewed - No data to display  Imaging Review No results found.  EKG Interpretation None LACERATION REPAIR Performed by: Montine Circle Authorized by: Montine Circle Consent: Verbal consent obtained. Risks and benefits: risks, benefits and alternatives were discussed Consent given by: patient Patient identity confirmed: provided demographic data Prepped and Draped in normal sterile fashion Wound explored  Laceration  Location: right palm  Laceration Length: 1 cm  No Foreign Bodies seen or palpated  Anesthesia: local infiltration  Local anesthetic: none  Anesthetic total: none  Irrigation method: syringe Amount of cleaning: standard  Skin closure: dermabond   Patient tolerance: Patient tolerated the procedure well with no immediate complications.    MDM   Final diagnoses:  Laceration   Patient with skin avulsion to right hand. There is no viable tissue to be repaired, the wounds will have to heal by secondary intention. Will give bacitracin, wound care and short course antibiotics given the patient's diabetes history. Tetanus shot is up-to-date. Patient understands and agrees with the plan. He is stable and ready for discharge.  I personally performed the services described in this documentation, which was scribed in my presence. The recorded information has been reviewed and is accurate.      Montine Circle, PA-C 10/08/14 0654  Montine Circle, PA-C 10/08/14 Speed, DO 10/08/14 2106  Montine Circle, PA-C 11/15/14 Kend[place], DO 11/17/14 1457

## 2014-10-07 NOTE — Discharge Instructions (Signed)
Deep Skin Avulsion A deep skin avulsion is when all layers of the skin or parts of body structures have been torn away. This is usually a result of severe injury (trauma). A deep skin avulsion can include damage to important structures beneath the skin such as tendons, ligaments, nerves, or blood vessels.  CAUSES  Many injuries can lead to a deep skin avulsion. These include:   Crush injuries.  Bites.  Falls against jagged surfaces.  Gunshot wounds.  Severe burns and injuries involving dragging (such as those from a bicycle or motorcycle accident). TREATMENT   If the wound is small and there is no damage to vital structures like nerves and blood vessels, the damaged tissues may be removed. Then, the wound can be cleaned thoroughly and closed.  A skin graft may be performed. This is a procedure in which the outer layer of skin is removed from a different part of your body. That skin (skin graft) is used to cover the open wound. This can happen after damaged tissue is removed and repairs are completed.  Your caregiver may onlyapply a bandage (dressing) to the wound. The wound will be kept clean and allowed to heal. Healing can take weeks or months and usually leaves a large scar. This type of treatment is only done if your caregiver feels that skin grafting or a similar procedure would not work. You might need a tetanus shot if:  You cannot remember when you had your last tetanus shot.  You have never had a tetanus shot.  The injury broke your skin. If you got a tetanus shot, your arm may swell, get red, and feel warm to the touch. This is common and not a problem. If you need a tetanus shot and you choose not to have one, there is a rare chance of getting tetanus. Sickness from tetanus can be serious. HOME CARE INSTRUCTIONS   Only take over-the-counter or prescription medicines for pain, discomfort, or fever as directed by your caregiver.  Gently wash the area with mild soap and  water 2 times a day, or as directed. Rinse off the soap. Pat the area dry with a clean towel. Do not rub the wound. This may cause bleeding.  Follow your caregiver's instructions for how often you need to change the dressing.  Apply ointment and a dressing to the wound as directed.  If the dressing sticks, moisten it with soapy water and gently remove it.  Change the bandage right away if it becomes wet, dirty, or starts to smell bad.  Take showers. Do not take tub baths, swim, or do anything that may soak the wound until it is healed.  Use anti-itch medicine as directed by your caregiver. The wound may itch when it is healing. Do not pick or scratch at the wound.  Follow up with your caregiver for stitches (sutures), staple, or skin adhesive strip removal. SEEK MEDICAL CARE IF:   You have redness, swelling, or increasing pain in your wound.  A red streak or line extends away from the wound.  You have pus coming from the wound.  You notice a bad smell coming from thewound or dressing.  The wound breaks open (edges not staying together) after sutures have been removed.  You notice something coming out of the wound, such as a small piece of wood, glass, or metal.  You are unable to properly move a finger or toe if the wound is on your hand or foot.  You have severe  swelling around the wound that causes pain and numbness.  Your arm, hand, leg, or foot changes color. SEEK IMMEDIATE MEDICAL CARE IF:   Your pain becomes severe or is not adequately relieved with pain medicine.  You have a fever.  You have nausea and vomiting for more than 24 hours.  You feel lightheaded, weak, or faint.  You develop chest pain or difficulty breathing. MAKE SURE YOU:   Understand these instructions.  Will watch your condition.  Will get help right away if you are not doing well or get worse. Document Released: 04/19/2006 Document Revised: 05/16/2011 Document Reviewed:  06/27/2010 [facility] Patient Information 2015 [place], [place]. This information is not intended to replace advice given to you by your health care provider. Make sure you discuss any questions you have with your health care provider.

## 2014-10-07 NOTE — ED Notes (Signed)
Questions r/t dc were denied. Pt ambulatory and a&ox4 

## 2014-10-29 DIAGNOSIS — I10 Essential (primary) hypertension: Secondary | ICD-10-CM | POA: Diagnosis not present

## 2014-10-29 DIAGNOSIS — N183 Chronic kidney disease, stage 3 (moderate): Secondary | ICD-10-CM | POA: Diagnosis not present

## 2014-10-29 DIAGNOSIS — R079 Chest pain, unspecified: Secondary | ICD-10-CM | POA: Diagnosis not present

## 2014-10-29 DIAGNOSIS — E119 Type 2 diabetes mellitus without complications: Secondary | ICD-10-CM | POA: Diagnosis not present

## 2014-10-30 DIAGNOSIS — F4320 Adjustment disorder, unspecified: Secondary | ICD-10-CM | POA: Diagnosis not present

## 2014-11-03 ENCOUNTER — Telehealth: Payer: Self-pay | Admitting: Cardiovascular Disease

## 2014-11-03 NOTE — Telephone Encounter (Signed)
Received records from [facility] for appointment with Dr Oval Linsey on 11/07/14.  Records given to [facility] (medical records) for Dr Blenda Mounts schedule on 11/07/14. lp

## 2014-11-07 ENCOUNTER — Ambulatory Visit (INDEPENDENT_AMBULATORY_CARE_PROVIDER_SITE_OTHER): Payer: Medicare Other | Admitting: Cardiovascular Disease

## 2014-11-07 ENCOUNTER — Encounter: Payer: Self-pay | Admitting: Cardiovascular Disease

## 2014-11-07 VITALS — BP 182/86 | HR 93 | Ht 70.0 in | Wt 208.6 lb

## 2014-11-07 DIAGNOSIS — Z01818 Encounter for other preprocedural examination: Secondary | ICD-10-CM

## 2014-11-07 DIAGNOSIS — R072 Precordial pain: Secondary | ICD-10-CM

## 2014-11-07 DIAGNOSIS — I10 Essential (primary) hypertension: Secondary | ICD-10-CM

## 2014-11-07 MED ORDER — AMLODIPINE BESYLATE 10 MG PO TABS: 10.0000 mg | ORAL_TABLET | Freq: Every day | ORAL | Status: DC

## 2014-11-07 NOTE — Progress Notes (Signed)
Cardiology Office Note   Date:  11/07/2014   ID:  Edward Colon, DOB Aug 15, 1980, MRN [id number]  PCP:  Edward Fendt, MD  Cardiologist:   Edward Harness, MD   Chief Complaint  Patient presents with  . New Evaluation    pt states no Sx.//surgical clearance for kidney, pancreas transplant      History of Present Illness: Edward Colon is a 34 y.o. male with DM Type I, CKD 3, hyperlipidemia and seizures who presents for evaluation of chest pain and transplant evaluation.  Edward Colon reports a few episodes of CP that began 3 months ago.  The first time he was walking from his car to a building at school and noted feeling fatigued and short of breath.  There was also chest tightness.  The episode lasted 5-10 minutes and improved with rest.  Since that time he notes 2 other episodes that occurred when playing with his dogs.  The pain is substernal and does not radiate.  In general he feels more fatigued than usual.  He used to work out regularly but has not had the energy to do so in the last several months.  When he does exercise he doesn't notice chest pain.  Edward Colon is in the process of being evaluated for kidney transplantation at [facility].  As a part of that evaluation he has to have a stress test.  Edward Colon was diagnosed with DM at age 42.  Edward Colon creatinine increased to 5 in the setting of treatment with an ACE-I, which was discontinued.  He followed up with his nephrologist, Edward Colon, on 10/29/14.  At that appointment his BP was 160/100.  He was instructed to double hydralazine to 100mg  bid and continue carvedilol 25mg  bid and lasix 40mg  bid.  He denies orthopnea or PND.  He does occasionally note LE edema, though it improves with elevation.   Past Medical History  Diagnosis Date  . Diabetes mellitus without complication   . Hypertension   . Seizures     No past surgical history on file.   Current Outpatient Prescriptions    Medication Sig Dispense Refill  . calcitRIOL (ROCALTROL) 0.25 MCG capsule Take 0.25 mcg by mouth daily.  5  . carvedilol (COREG) 25 MG tablet Take 25 mg by mouth 2 (two) times daily.  5  . cephALEXin (KEFLEX) 500 MG capsule Take 1 capsule (500 mg total) by mouth 4 (four) times daily. 28 capsule 0  . furosemide (LASIX) 40 MG tablet Take 1 tablet by mouth 2 (two) times daily.    . hydrALAZINE (APRESOLINE) 50 MG tablet Take 50 mg by mouth 2 (two) times daily.  5  . insulin glargine (LANTUS) 100 UNIT/ML injection Inject 0.15 mLs (15 Units total) into the skin every morning. 10 mL 11  . insulin lispro (HUMALOG) 100 UNIT/ML injection Inject 2-10 Units into the skin 3 (three) times daily as needed for high blood sugar (high blood sugar).     Marland Kitchen levETIRAcetam (KEPPRA) 500 MG tablet Take 1 tablet (500 mg total) by mouth 2 (two) times daily. 60 tablet 1  . amLODipine (NORVASC) 10 MG tablet Take 1 tablet (10 mg total) by mouth daily. 90 tablet 3   No current facility-administered medications for this visit.    Allergies:   Review of patient's allergies indicates no known allergies.    Social History:  The patient  reports that he has never smoked. He has never used smokeless tobacco. He  reports that he does not drink alcohol or use illicit drugs.   Family History:  The patient's family history includes Hypertension in his father and mother.    ROS:  Please see the history of present illness.   Otherwise, review of systems are positive for none.   All other systems are reviewed and negative.    PHYSICAL EXAM: VS:  BP 182/86 mmHg  Pulse 93  Ht 5\' 10"  (1.778 m)  Wt 94.62 kg (208 lb 9.6 oz)  BMI 29.93 kg/m2 , BMI Body mass index is 29.93 kg/(m^2). GENERAL:  Well appearing HEENT:  Pupils equal round and reactive, fundi not visualized, oral mucosa unremarkable NECK:  No jugular venous distention, waveform within normal limits, carotid upstroke brisk and symmetric, no bruits, no  thyromegaly LYMPHATICS:  No cervical adenopathy LUNGS:  Clear to auscultation bilaterally HEART:  RRR.  PMI not displaced or sustained,S1 and S2 within normal limits, no S3, no S4, no clicks, no rubs, II/Colon murmur at LUSB. ABD:  Flat, positive bowel sounds normal in frequency in pitch, no bruits, no rebound, no guarding, no midline pulsatile mass, no hepatomegaly, no splenomegaly EXT:  2 plus pulses throughout, no edema, no cyanosis no clubbing SKIN:  No rashes no nodules NEURO:  Cranial nerves II through XII grossly intact, motor grossly intact throughout PSYCH:  Cognitively intact, oriented to person place and time    EKG:  EKG is ordered today. The ekg ordered today demonstrates sinus rhythm at 93 bpm.  LVH.   Recent Labs: 08/30/2014: ALT 28 09/01/2014: Hemoglobin 9.1*; Platelets 204 09/02/2014: BUN 39*; Creatinine, Ser 3.64*; Potassium 4.3; Sodium 140    Lipid Panel No results found for: CHOL, TRIG, HDL, CHOLHDL, VLDL, LDLCALC, LDLDIRECT    Wt Readings from Last 3 Encounters:  11/07/14 94.62 kg (208 lb 9.6 oz)  10/07/14 90.719 kg (200 lb)  08/30/14 94.348 kg (208 lb)      Other studies Reviewed: Additional studies/ records that were reviewed today include: . Review of the above records demonstrates:  Please see elsewhere in the note.     ASSESSMENT AND PLAN:  # Chest pain: Symptoms are concerning for ischemia given his history of DM Type I.  It may also be due to hypertensive urgency in the setting of poorly controlled BP. - Exercise treadmill test - Continue carvedilol  # Hypertension:  BP above goal. Hydralazine was recently double to 100mg  bid from 50mg  bid. - Continue carvedilol and hydralazine - Start amlodipine 10mg  daily - No ACE-I, as he had a significant decrease in GFR on lisinopril.   Current medicines are reviewed at length with the patient today.  The patient does not have concerns regarding medicines.  The following changes have been made:  Start  amlodipine  Labs/ tests ordered today include:   Orders Placed This Encounter  Procedures  . Exercise Tolerance Test  . EKG 12-Lead     Disposition:   FU for BP check in 1 month and with Dr. Jonelle Sidle C. Colon in 6 months.    Signed, Edward Harness, MD  11/07/2014 5:21 PM    [place]

## 2014-11-07 NOTE — Patient Instructions (Signed)
Dr Oval Linsey has recommended making the following medication changes: START Amlodipine 10 mg - take 1 tablet by mouth daily. A prescription has been sent to your pharmacy electronically.  Your physician has requested that you have an exercise tolerance test. For further information please visit [email or web address]. Please also follow instruction sheet, as given.  Your physician recommends that you schedule an appointment in 1 month for a blood pressure check with our pharmacist, Tommy Medal.  Dr Oval Linsey recommends that you schedule a follow-up appointment in 6 months. You will receive a reminder letter in the mail two months in advance. If you don't receive a letter, please call our office to schedule the follow-up appointment.

## 2014-11-26 DIAGNOSIS — N5201 Erectile dysfunction due to arterial insufficiency: Secondary | ICD-10-CM | POA: Diagnosis not present

## 2014-11-28 ENCOUNTER — Telehealth (HOSPITAL_COMMUNITY): Payer: Self-pay

## 2014-11-28 NOTE — Telephone Encounter (Signed)
Encounter complete. 

## 2014-12-02 ENCOUNTER — Telehealth (HOSPITAL_COMMUNITY): Payer: Self-pay

## 2014-12-02 NOTE — Telephone Encounter (Signed)
Encounter complete. 

## 2014-12-03 ENCOUNTER — Inpatient Hospital Stay (HOSPITAL_COMMUNITY): Admission: RE | Admit: 2014-12-03 | Payer: Medicare Other | Source: Ambulatory Visit

## 2014-12-11 ENCOUNTER — Ambulatory Visit (INDEPENDENT_AMBULATORY_CARE_PROVIDER_SITE_OTHER): Payer: Medicare Other | Admitting: Pharmacist Clinician (PhC)/ Clinical Pharmacy Specialist

## 2014-12-11 VITALS — BP 172/98 | HR 92 | Ht 70.0 in | Wt 203.4 lb

## 2014-12-11 DIAGNOSIS — I152 Hypertension secondary to endocrine disorders: Secondary | ICD-10-CM | POA: Diagnosis not present

## 2014-12-11 MED ORDER — HYDRALAZINE HCL 100 MG PO TABS: 100.0000 mg | ORAL_TABLET | Freq: Three times a day (TID) | ORAL | Status: DC

## 2014-12-11 NOTE — Patient Instructions (Signed)
Return for a a follow up appointment when you have your stress test   Your blood pressure today is 172/98  Check your blood pressure at home daily (if able) and keep record of the readings.  Take your BP meds as follows: increase hydralazine to 3 times daily  Bring all of your meds, your BP cuff and your record of home blood pressures to your next appointment.  Exercise as you're able, try to walk approximately 30 minutes per day.  Keep salt intake to a minimum, especially watch canned and prepared boxed foods.  Eat more fresh fruits and vegetables and fewer canned items.  Avoid eating in fast food restaurants.    HOW TO TAKE YOUR BLOOD PRESSURE: . Rest 5 minutes before taking your blood pressure. .  Don't smoke or drink caffeinated beverages for at least 30 minutes before. . Take your blood pressure before (not after) you eat. . Sit comfortably with your back supported and both feet on the floor (don't cross your legs). . Elevate your arm to heart level on a table or a desk. . Use the proper sized cuff. It should fit smoothly and snugly around your bare upper arm. There should be enough room to slip a fingertip under the cuff. The bottom edge of the cuff should be 1 inch above the crease of the elbow. . Ideally, take 3 measurements at one sitting and record the average.

## 2014-12-12 ENCOUNTER — Encounter: Payer: Self-pay | Admitting: Pharmacist Clinician (PhC)/ Clinical Pharmacy Specialist

## 2014-12-12 NOTE — Progress Notes (Signed)
     12/12/2014 Edward Colon [birth year] [id number]   HPI:  Edward Colon is a 34 y.o. male patient of Dr Oval Linsey, with a [place] below who presents today for hypertension clinic evaluation.  He is a young man with a complex medical history including insulin dependent DM diagnosed at age 44, stage 3 CKD, hyperlipidemia and most recently seizure disorder (just in the past 2 years).  He is currently in the process of working with [facility] to see if he can qualify for a kidney transplant.  Cardiac Hx:  Hypertension, hyperlipidemia  Family Hx: notable for both parents having hypertension  Social Hx: he has never used tobacco, does not drink alcohol  Diet: per patient he is "working on it".  His last A1c was 9, and he states that his appetite and desire to eat is diminished.    Exercise: exercises regularly, running 2-4 miles at least twice weekly, and regular calisthenics   Home BP readings: average in the [id number] systolic range with diastolic all [id number].  Has not had anything [id number] or [id number] systolic, or > [id number] diastolic  Current antihypertensive medications: hydralazine 100 mg bid, amlodipine 10 mg qd, carvedilol 25 mg bid.  Patient states compliance with all medications.  He was previously tried on an ACEI but SCr increased to 5 and it was discontinued.     Current Outpatient Prescriptions  Medication Sig Dispense Refill  . amLODipine (NORVASC) 10 MG tablet Take 1 tablet (10 mg total) by mouth daily. 90 tablet 3  . calcitRIOL (ROCALTROL) 0.25 MCG capsule Take 0.25 mcg by mouth daily.  5  . carvedilol (COREG) 25 MG tablet Take 25 mg by mouth 2 (two) times daily.  5  . furosemide (LASIX) 40 MG tablet Take 1 tablet by mouth 2 (two) times daily.    . hydrALAZINE (APRESOLINE) 100 MG tablet Take 1 tablet (100 mg total) by mouth 3 (three) times daily. 90 tablet 3  . insulin glargine (LANTUS) 100 UNIT/ML injection Inject 0.15 mLs (15 Units total) into the skin every morning.  10 mL 11  . insulin lispro (HUMALOG) 100 UNIT/ML injection Inject 2-10 Units into the skin 3 (three) times daily as needed for high blood sugar (high blood sugar).     Marland Kitchen levETIRAcetam (KEPPRA) 500 MG tablet Take 1 tablet (500 mg total) by mouth 2 (two) times daily. 60 tablet 1   No current facility-administered medications for this visit.    No Known Allergies  Past Medical History  Diagnosis Date  . Diabetes mellitus without complication ([place])   . Hypertension   . Seizures ([place])   . Chronic kidney disease     Blood pressure 172/98, pulse 92, height 5\' 10"  (1.778 m), weight 203 lb 6.4 oz (92.262 kg).    Tommy Medal PharmD CPP [facility]

## 2014-12-12 NOTE — Assessment & Plan Note (Addendum)
This is a very pleasant young man with continued hypertension, despite his medication regimen.  Because of his CKD we are somewhat limited in his medication options.  His current GFR is 16, CrCl at 26.  ACEI and ARB are contra-indicated at this point, as is spironolactone.  Would have to proceed cautiously with chlorthalidone.  Clonidine has no real data with low renal function, only to use with caution.  Unfortunately they list concern for increased hypotension, bradycardia and sedation.  Because of his diabetes and occasional hypoglycemic episodes, our concern would be if he developed any of the clonidine side effects in the setting of low blood sugars.  Today will increase his hydralazine to three times daily (per recommendations once GFR is <10 will need to limit to twice daily.  I have also asked him to go the lab next week for a repeat CMP, so we can get a current renal function panel.  Will see him again in 3 weeks for follow up.

## 2014-12-31 ENCOUNTER — Telehealth (HOSPITAL_COMMUNITY): Payer: Self-pay

## 2014-12-31 NOTE — Telephone Encounter (Signed)
Encounter complete. 

## 2015-01-02 ENCOUNTER — Ambulatory Visit (INDEPENDENT_AMBULATORY_CARE_PROVIDER_SITE_OTHER): Payer: Medicare Other | Admitting: Pharmacist Clinician (PhC)/ Clinical Pharmacy Specialist

## 2015-01-02 ENCOUNTER — Ambulatory Visit (HOSPITAL_COMMUNITY)
Admission: RE | Admit: 2015-01-02 | Discharge: 2015-01-02 | Disposition: A | Discharge status: Home / Self Care | Payer: Medicare Other | Source: Ambulatory Visit | Attending: Cardiovascular Disease | Admitting: Cardiovascular Disease

## 2015-01-02 ENCOUNTER — Encounter: Payer: Self-pay | Admitting: Pharmacist Clinician (PhC)/ Clinical Pharmacy Specialist

## 2015-01-02 VITALS — BP 128/74

## 2015-01-02 DIAGNOSIS — Z01818 Encounter for other preprocedural examination: Secondary | ICD-10-CM | POA: Insufficient documentation

## 2015-01-02 DIAGNOSIS — I10 Essential (primary) hypertension: Secondary | ICD-10-CM | POA: Diagnosis not present

## 2015-01-02 LAB — EXERCISE TOLERANCE TEST
CHL CUP RESTING HR STRESS: 93 {beats}/min
CHL RATE OF PERCEIVED EXERTION: 16
CSEPED: 13 min
Estimated workload: 15.8 METS
Exercise duration (sec): 20 s
MPHR: 188 {beats}/min
Peak HR: 155 {beats}/min
Percent HR: 82 %

## 2015-01-02 NOTE — Patient Instructions (Signed)
Return for a a follow up appointment in 3 weeks  Your blood pressure today is 128/74  Check your blood pressure at home daily and keep record of the readings.  Take your BP meds as follows: cut hydralazine to 1/2 tablet (50 mg) three times daily.  Add Clonidine 0.1 mg twice daily.  Call if this makes you more drowsy or gives you problems  Erasmo Downer at [phone]  Bring all of your meds, your BP cuff and your record of home blood pressures to your next appointment.  Exercise as you're able, try to walk approximately 30 minutes per day.  Keep salt intake to a minimum, especially watch canned and prepared boxed foods.  Eat more fresh fruits and vegetables and fewer canned items.  Avoid eating in fast food restaurants.    HOW TO TAKE YOUR BLOOD PRESSURE: . Rest 5 minutes before taking your blood pressure. .  Don't smoke or drink caffeinated beverages for at least 30 minutes before. . Take your blood pressure before (not after) you eat. . Sit comfortably with your back supported and both feet on the floor (don't cross your legs). . Elevate your arm to heart level on a table or a desk. . Use the proper sized cuff. It should fit smoothly and snugly around your bare upper arm. There should be enough room to slip a fingertip under the cuff. The bottom edge of the cuff should be 1 inch above the crease of the elbow. . Ideally, take 3 measurements at one sitting and record the average.

## 2015-01-02 NOTE — Progress Notes (Signed)
.       01/02/2015 GAMBLE KNICELEY 08/08/1980 [id number]   HPI:  Edward Colon is a 34 y.o. male patient of Dr Oval Linsey, with a [place] below who presents today for hypertension clinic follow up.  He is a young man with a complex medical history including insulin dependent DM diagnosed at age 44, stage 3 CKD, hyperlipidemia and most recently seizure disorder (just in the past 2 years).  He is currently in the process of working with [facility] to see if he can qualify for a kidney transplant.  Today before we saw him he had a treadmill stress test, went for over 13 minutes at 5 mph with 16% incline.  BP taken before test was 145/69 and at peak was 183/79.  At his last visit we increased his hydralazine to 100 mg tid, and today he reports fatigue after his hydralazine doses.    Cardiac Hx:  Hypertension, hyperlipidemia  Family Hx: notable for both parents having hypertension  Social Hx: he has never used tobacco, does not drink alcohol  Diet: per patient he is "working on it".  His last A1c was 9, and he states that his appetite and desire to eat is diminished.    Exercise: exercises regularly, running 2-4 miles at least twice weekly, and regular calisthenics   Home BP readings:  None WNL, range from 123XX123 systolic; diastolic started high, but have all been WNL for past 2 weeks  Current antihypertensive medications: hydralazine 100 mg tid, amlodipine 10 mg qd, carvedilol 25 mg bid.  Patient states compliance with all medications.  He was previously tried on an ACEI but SCr increased to 5 and it was discontinued.     Current Outpatient Prescriptions  Medication Sig Dispense Refill  . amLODipine (NORVASC) 10 MG tablet Take 1 tablet (10 mg total) by mouth daily. 90 tablet 3  . calcitRIOL (ROCALTROL) 0.25 MCG capsule Take 0.25 mcg by mouth daily.  5  . carvedilol (COREG) 25 MG tablet Take 25 mg by mouth 2 (two) times daily.  5  . furosemide (LASIX) 40 MG tablet Take 1  tablet by mouth 2 (two) times daily.    . hydrALAZINE (APRESOLINE) 100 MG tablet Take 1 tablet (100 mg total) by mouth 3 (three) times daily. 90 tablet 3  . insulin glargine (LANTUS) 100 UNIT/ML injection Inject 0.15 mLs (15 Units total) into the skin every morning. 10 mL 11  . insulin lispro (HUMALOG) 100 UNIT/ML injection Inject 2-10 Units into the skin 3 (three) times daily as needed for high blood sugar (high blood sugar).     Marland Kitchen levETIRAcetam (KEPPRA) 500 MG tablet Take 1 tablet (500 mg total) by mouth 2 (two) times daily. 60 tablet 1   No current facility-administered medications for this visit.    No Known Allergies  Past Medical History  Diagnosis Date  . Diabetes mellitus without complication ([place])   . Hypertension   . Seizures ([place])   . Chronic kidney disease     Blood pressure 128/74.    Tommy Medal PharmD CPP [facility]

## 2015-01-02 NOTE — Assessment & Plan Note (Signed)
Today his pressure looks great in the office.  He has just finished his treadmill stress test, so not necessarily the most accurate representation.  He is having problems with the higher dose of hydralazine, and his home pressure is actually not as good as previously.  We are severely limited because of his kidney disease and although chlorthalidone lists as safe for GFR down to 10 (his last reported was 16), I don't want to add any strain to his kidneys.  Today we will cut the hydralazine back to 50 mg tid and add clonidine 0.1 mg bid.  We discussed the side effects of this, that the drowsiness/fatigue may not be any different than with hydralazine, but he is willing to try.  I will see him back in 3 weeks for follow up.

## 2015-01-07 ENCOUNTER — Telehealth: Payer: Self-pay | Admitting: *Deleted

## 2015-01-07 NOTE — Telephone Encounter (Signed)
-----   Message from Skeet Latch, MD sent at 01/05/2015  3:55 PM EDT ----- Normal stress test.

## 2015-01-07 NOTE — Telephone Encounter (Signed)
Spoke to patient. Result given . Verbalized understanding  

## 2015-01-22 ENCOUNTER — Ambulatory Visit (INDEPENDENT_AMBULATORY_CARE_PROVIDER_SITE_OTHER): Payer: Medicare Other | Admitting: Pharmacist Clinician (PhC)/ Clinical Pharmacy Specialist

## 2015-01-22 ENCOUNTER — Encounter: Payer: Self-pay | Admitting: Pharmacist Clinician (PhC)/ Clinical Pharmacy Specialist

## 2015-01-22 VITALS — BP 174/96 | HR 80 | Ht 71.0 in | Wt 201.2 lb

## 2015-01-22 DIAGNOSIS — I10 Essential (primary) hypertension: Secondary | ICD-10-CM | POA: Diagnosis not present

## 2015-01-22 NOTE — Patient Instructions (Signed)
Return for a a follow up appointment in January  Your blood pressure today is 174/96  Check your blood pressure at home daily and keep record of the readings.  Take your BP meds as follows: restart the amlodipine 10 mg daily and carvedilol 25 mg twice daily  Bring all of your meds, your BP cuff and your record of home blood pressures to your next appointment.  Exercise as you're able, try to walk approximately 30 minutes per day.  Keep salt intake to a minimum, especially watch canned and prepared boxed foods.  Eat more fresh fruits and vegetables and fewer canned items.  Avoid eating in fast food restaurants.    HOW TO TAKE YOUR BLOOD PRESSURE: . Rest 5 minutes before taking your blood pressure. .  Don't smoke or drink caffeinated beverages for at least 30 minutes before. . Take your blood pressure before (not after) you eat. . Sit comfortably with your back supported and both feet on the floor (don't cross your legs). . Elevate your arm to heart level on a table or a desk. . Use the proper sized cuff. It should fit smoothly and snugly around your bare upper arm. There should be enough room to slip a fingertip under the cuff. The bottom edge of the cuff should be 1 inch above the crease of the elbow. . Ideally, take 3 measurements at one sitting and record the average.

## 2015-01-22 NOTE — Progress Notes (Signed)
.     01/22/2015 Edward Colon 01-Sep-1980 [id number]   HPI:  Edward Colon is a 34 y.o. male patient of Dr Oval Linsey, with a [place] below who presents today for hypertension clinic follow up.  He is a young man with a complex medical history including insulin dependent DM diagnosed at age 38, stage 3 CKD, hyperlipidemia and most recently seizure disorder (just in the past 2 years).  He is currently in the process of working with [facility] to see if he can qualify for a kidney transplant.  At his last visit, his pressure was well controlled at 128/74.  This was after having a treadmill stress test.  At that time he complained that the hydralazine made him somewhat drowsy and fatigued.  We talked about the limited options for him with blood pressure medications and it was decided to cut the hydralazine dose by 1/2 and add clonidine 0.1 mg bid.  Unfortunately this increased his feelings of fatigue and he stopped taking his BP meds on days that he needed to work.  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    Cardiac Hx:  Hypertension, hyperlipidemia  Family Hx: notable for both parents having hypertension  Social Hx: he has never used tobacco, does not drink alcohol  Diet: per patient he is "working on it".  His last A1c was 9, and he states that his appetite and desire to eat is diminished.    Exercise: exercises regularly, running 2-4 miles at least twice weekly, and regular calisthenics   Home BP readings:  States have been mostly the same, slightly above normal whether he takes the meds or not.    Current antihypertensive medications: none this past week, should be on amlodipine 10 qd, carvedilol 25 bid, hydralazine 50 mg tid and clonidine 01 mg bid.  He was previously tried on an ACEI but SCr increased to 5 and it was discontinued.     Current Outpatient Prescriptions  Medication Sig Dispense Refill  . amLODipine (NORVASC) 10 MG tablet Take 1 tablet (10 mg  total) by mouth daily. 90 tablet 3  . calcitRIOL (ROCALTROL) 0.25 MCG capsule Take 0.25 mcg by mouth daily.  5  . carvedilol (COREG) 25 MG tablet Take 25 mg by mouth 2 (two) times daily.  5  . cloNIDine (CATAPRES) 0.1 MG tablet Take 0.1 mg by mouth 2 (two) times daily.    . furosemide (LASIX) 40 MG tablet Take 1 tablet by mouth 2 (two) times daily.    . hydrALAZINE (APRESOLINE) 100 MG tablet Take 1 tablet (100 mg total) by mouth 3 (three) times daily. 90 tablet 3  . insulin glargine (LANTUS) 100 UNIT/ML injection Inject 0.15 mLs (15 Units total) into the skin every morning. 10 mL 11  . insulin lispro (HUMALOG) 100 UNIT/ML injection Inject 2-10 Units into the skin 3 (three) times daily as needed for high blood sugar (high blood sugar).     Marland Kitchen levETIRAcetam (KEPPRA) 500 MG tablet Take 1 tablet (500 mg total) by mouth 2 (two) times daily. 60 tablet 1   No current facility-administered medications for this visit.    No Known Allergies  Past Medical History  Diagnosis Date  . Diabetes mellitus without complication ([place])   . Hypertension   . Seizures ([place])   . Chronic kidney disease     There were no vitals taken for this visit.    Tommy Medal PharmD CPP [facility]

## 2015-01-22 NOTE — Assessment & Plan Note (Addendum)
BP still out of control today, has not had any BP meds in 3-4 days.  Advised that he restart the amlodipine and carvedilol immediately, but hold off on the hydralazine and clonidine for now.  He will need to monitor his salt intake and continue with his exercise regimen.  He is also going to contact [facility] about his transplant status.  I will see him back in January, and stressed that he not discontinue his amlodipine or carvedilol again.

## 2015-02-17 DIAGNOSIS — E161 Other hypoglycemia: Secondary | ICD-10-CM | POA: Diagnosis not present

## 2015-02-17 DIAGNOSIS — R739 Hyperglycemia, unspecified: Secondary | ICD-10-CM | POA: Diagnosis not present

## 2015-02-27 ENCOUNTER — Encounter (HOSPITAL_COMMUNITY): Payer: Self-pay

## 2015-02-27 ENCOUNTER — Inpatient Hospital Stay (HOSPITAL_COMMUNITY): Payer: Medicare Other

## 2015-02-27 ENCOUNTER — Inpatient Hospital Stay (HOSPITAL_COMMUNITY)
Admission: EM | Admit: 2015-02-27 | Discharge: 2015-03-07 | DRG: 674 | Disposition: A | Discharge status: Home / Self Care | Payer: Medicare Other | Attending: Internal Medicine | Admitting: Internal Medicine

## 2015-02-27 DIAGNOSIS — E875 Hyperkalemia: Secondary | ICD-10-CM | POA: Diagnosis present

## 2015-02-27 DIAGNOSIS — Z794 Long term (current) use of insulin: Secondary | ICD-10-CM | POA: Diagnosis not present

## 2015-02-27 DIAGNOSIS — N2581 Secondary hyperparathyroidism of renal origin: Secondary | ICD-10-CM | POA: Diagnosis present

## 2015-02-27 DIAGNOSIS — E162 Hypoglycemia, unspecified: Secondary | ICD-10-CM | POA: Diagnosis not present

## 2015-02-27 DIAGNOSIS — R059 Cough, unspecified: Secondary | ICD-10-CM

## 2015-02-27 DIAGNOSIS — Z992 Dependence on renal dialysis: Secondary | ICD-10-CM

## 2015-02-27 DIAGNOSIS — R739 Hyperglycemia, unspecified: Secondary | ICD-10-CM

## 2015-02-27 DIAGNOSIS — Z23 Encounter for immunization: Secondary | ICD-10-CM

## 2015-02-27 DIAGNOSIS — J029 Acute pharyngitis, unspecified: Secondary | ICD-10-CM | POA: Diagnosis present

## 2015-02-27 DIAGNOSIS — N185 Chronic kidney disease, stage 5: Secondary | ICD-10-CM | POA: Diagnosis not present

## 2015-02-27 DIAGNOSIS — E10649 Type 1 diabetes mellitus with hypoglycemia without coma: Secondary | ICD-10-CM | POA: Diagnosis present

## 2015-02-27 DIAGNOSIS — E1022 Type 1 diabetes mellitus with diabetic chronic kidney disease: Secondary | ICD-10-CM | POA: Diagnosis present

## 2015-02-27 DIAGNOSIS — R569 Unspecified convulsions: Secondary | ICD-10-CM

## 2015-02-27 DIAGNOSIS — I10 Essential (primary) hypertension: Secondary | ICD-10-CM | POA: Diagnosis not present

## 2015-02-27 DIAGNOSIS — D649 Anemia, unspecified: Secondary | ICD-10-CM | POA: Diagnosis present

## 2015-02-27 DIAGNOSIS — N19 Unspecified kidney failure: Secondary | ICD-10-CM | POA: Diagnosis not present

## 2015-02-27 DIAGNOSIS — Z7682 Awaiting organ transplant status: Secondary | ICD-10-CM

## 2015-02-27 DIAGNOSIS — E1040 Type 1 diabetes mellitus with diabetic neuropathy, unspecified: Secondary | ICD-10-CM | POA: Diagnosis present

## 2015-02-27 DIAGNOSIS — D638 Anemia in other chronic diseases classified elsewhere: Secondary | ICD-10-CM

## 2015-02-27 DIAGNOSIS — G40909 Epilepsy, unspecified, not intractable, without status epilepticus: Secondary | ICD-10-CM | POA: Diagnosis present

## 2015-02-27 DIAGNOSIS — J069 Acute upper respiratory infection, unspecified: Secondary | ICD-10-CM | POA: Diagnosis present

## 2015-02-27 DIAGNOSIS — I120 Hypertensive chronic kidney disease with stage 5 chronic kidney disease or end stage renal disease: Secondary | ICD-10-CM | POA: Diagnosis not present

## 2015-02-27 DIAGNOSIS — R05 Cough: Secondary | ICD-10-CM | POA: Diagnosis not present

## 2015-02-27 DIAGNOSIS — E1065 Type 1 diabetes mellitus with hyperglycemia: Secondary | ICD-10-CM | POA: Diagnosis not present

## 2015-02-27 DIAGNOSIS — E8770 Fluid overload, unspecified: Secondary | ICD-10-CM | POA: Diagnosis present

## 2015-02-27 DIAGNOSIS — E161 Other hypoglycemia: Secondary | ICD-10-CM | POA: Diagnosis not present

## 2015-02-27 DIAGNOSIS — N189 Chronic kidney disease, unspecified: Secondary | ICD-10-CM | POA: Diagnosis not present

## 2015-02-27 DIAGNOSIS — D631 Anemia in chronic kidney disease: Secondary | ICD-10-CM | POA: Diagnosis not present

## 2015-02-27 DIAGNOSIS — Z452 Encounter for adjustment and management of vascular access device: Secondary | ICD-10-CM | POA: Diagnosis not present

## 2015-02-27 DIAGNOSIS — N186 End stage renal disease: Secondary | ICD-10-CM

## 2015-02-27 DIAGNOSIS — E1122 Type 2 diabetes mellitus with diabetic chronic kidney disease: Secondary | ICD-10-CM | POA: Diagnosis not present

## 2015-02-27 DIAGNOSIS — Z95828 Presence of other vascular implants and grafts: Secondary | ICD-10-CM

## 2015-02-27 DIAGNOSIS — N179 Acute kidney failure, unspecified: Secondary | ICD-10-CM | POA: Diagnosis not present

## 2015-02-27 DIAGNOSIS — Z419 Encounter for procedure for purposes other than remedying health state, unspecified: Secondary | ICD-10-CM

## 2015-02-27 DIAGNOSIS — I129 Hypertensive chronic kidney disease with stage 1 through stage 4 chronic kidney disease, or unspecified chronic kidney disease: Secondary | ICD-10-CM | POA: Diagnosis not present

## 2015-02-27 HISTORY — DX: Type 1 diabetes mellitus without complications: E10.9

## 2015-02-27 LAB — URINALYSIS, ROUTINE W REFLEX MICROSCOPIC
Bilirubin Urine: NEGATIVE
Glucose, UA: 250 mg/dL — AB
KETONES UR: NEGATIVE mg/dL
Leukocytes, UA: NEGATIVE
Nitrite: NEGATIVE
Protein, ur: >300 mg/dL — AB
SPECIFIC GRAVITY, URINE: 1.012 (ref 1.005–1.030)
pH: 6.5 (ref 5.0–8.0)

## 2015-02-27 LAB — CBC
HEMATOCRIT: 30 % — AB (ref 39.0–52.0)
HEMOGLOBIN: 10 g/dL — AB (ref 13.0–17.0)
MCH: 27.4 pg (ref 26.0–34.0)
MCHC: 33.3 g/dL (ref 30.0–36.0)
MCV: 82.2 fL (ref 78.0–100.0)
Platelets: 275 10*3/uL (ref 150–400)
RBC: 3.65 MIL/uL — ABNORMAL LOW (ref 4.22–5.81)
RDW: 14.2 % (ref 11.5–15.5)
WBC: 8.3 10*3/uL (ref 4.0–10.5)

## 2015-02-27 LAB — COMPREHENSIVE METABOLIC PANEL
ALT: 16 U/L — AB (ref 17–63)
AST: 22 U/L (ref 15–41)
Albumin: 2.8 g/dL — ABNORMAL LOW (ref 3.5–5.0)
Alkaline Phosphatase: 95 U/L (ref 38–126)
Anion gap: 11 (ref 5–15)
BILIRUBIN TOTAL: 0.6 mg/dL (ref 0.3–1.2)
BUN: 55 mg/dL — AB (ref 6–20)
CO2: 21 mmol/L — ABNORMAL LOW (ref 22–32)
Calcium: 6.9 mg/dL — ABNORMAL LOW (ref 8.9–10.3)
Chloride: 106 mmol/L (ref 101–111)
Creatinine, Ser: 7.22 mg/dL — ABNORMAL HIGH (ref 0.61–1.24)
GFR calc Af Amer: 10 mL/min — ABNORMAL LOW (ref >60)
GFR, EST NON AFRICAN AMERICAN: 9 mL/min — AB (ref >60)
Glucose, Bld: 372 mg/dL — ABNORMAL HIGH (ref 65–99)
Potassium: 5.5 mmol/L — ABNORMAL HIGH (ref 3.5–5.1)
Sodium: 138 mmol/L (ref 135–145)
TOTAL PROTEIN: 6.6 g/dL (ref 6.5–8.1)

## 2015-02-27 LAB — CBG MONITORING, ED: Glucose-Capillary: 482 mg/dL — ABNORMAL HIGH (ref 65–99)

## 2015-02-27 LAB — GLUCOSE, CAPILLARY
GLUCOSE-CAPILLARY: 184 mg/dL — AB (ref 65–99)
GLUCOSE-CAPILLARY: 132 mg/dL — AB (ref 65–99)
GLUCOSE-CAPILLARY: 235 mg/dL — AB (ref 65–99)
Glucose-Capillary: 473 mg/dL — ABNORMAL HIGH (ref 65–99)
Glucose-Capillary: 419 mg/dL — ABNORMAL HIGH (ref 65–99)

## 2015-02-27 LAB — URINE MICROSCOPIC-ADD ON: Bacteria, UA: NONE SEEN

## 2015-02-27 LAB — GLUCOSE, RANDOM: Glucose, Bld: 489 mg/dL — ABNORMAL HIGH (ref 65–99)

## 2015-02-27 MED ORDER — SODIUM CHLORIDE 0.9 % IV BOLUS (SEPSIS)
1000.0000 mL | Freq: Once | INTRAVENOUS | Status: AC
  Administered 2015-02-27: 1000 mL via INTRAVENOUS

## 2015-02-27 MED ORDER — INSULIN ASPART 100 UNIT/ML ~~LOC~~ SOLN
0.0000 [IU] | Freq: Three times a day (TID) | SUBCUTANEOUS | Status: DC
  Administered 2015-02-27 (×2): 9 [IU] via SUBCUTANEOUS
  Administered 2015-02-28: 7 [IU] via SUBCUTANEOUS
  Administered 2015-02-28: 1 [IU] via SUBCUTANEOUS
  Administered 2015-02-28: 5 [IU] via SUBCUTANEOUS
  Administered 2015-03-01: 2 [IU] via SUBCUTANEOUS
  Administered 2015-03-01: 1 [IU] via SUBCUTANEOUS
  Administered 2015-03-01: 3 [IU] via SUBCUTANEOUS
  Administered 2015-03-02: 1 [IU] via SUBCUTANEOUS
  Administered 2015-03-02 (×2): 5 [IU] via SUBCUTANEOUS
  Administered 2015-03-04: 7 [IU] via SUBCUTANEOUS
  Administered 2015-03-04: 9 [IU] via SUBCUTANEOUS
  Administered 2015-03-05 (×2): 2 [IU] via SUBCUTANEOUS
  Administered 2015-03-06: 3 [IU] via SUBCUTANEOUS

## 2015-02-27 MED ORDER — LEVETIRACETAM 500 MG PO TABS
500.0000 mg | ORAL_TABLET | Freq: Two times a day (BID) | ORAL | Status: DC
  Administered 2015-02-27 – 2015-03-07 (×17): 500 mg via ORAL
  Filled 2015-02-27 (×18): qty 1

## 2015-02-27 MED ORDER — INSULIN GLARGINE 100 UNIT/ML ~~LOC~~ SOLN
15.0000 [IU] | Freq: Every morning | SUBCUTANEOUS | Status: DC
  Administered 2015-02-28: 15 [IU] via SUBCUTANEOUS
  Filled 2015-02-27: qty 0.15

## 2015-02-27 MED ORDER — AMLODIPINE BESYLATE 10 MG PO TABS
10.0000 mg | ORAL_TABLET | Freq: Every day | ORAL | Status: DC
  Administered 2015-02-27 – 2015-03-06 (×8): 10 mg via ORAL
  Filled 2015-02-27 (×8): qty 1

## 2015-02-27 MED ORDER — ONDANSETRON HCL 4 MG PO TABS: 4.0000 mg | ORAL_TABLET | Freq: Four times a day (QID) | ORAL | Status: DC | PRN

## 2015-02-27 MED ORDER — ONDANSETRON HCL 4 MG/2ML IJ SOLN: 4.0000 mg | Freq: Four times a day (QID) | INTRAMUSCULAR | Status: DC | PRN

## 2015-02-27 MED ORDER — CALCITRIOL 0.25 MCG PO CAPS
0.2500 ug | ORAL_CAPSULE | Freq: Every day | ORAL | Status: DC
  Administered 2015-02-27 – 2015-03-06 (×8): 0.25 ug via ORAL
  Filled 2015-02-27 (×8): qty 1

## 2015-02-27 MED ORDER — INSULIN GLARGINE 100 UNIT/ML ~~LOC~~ SOLN
7.0000 [IU] | Freq: Every morning | SUBCUTANEOUS | Status: DC
  Administered 2015-02-27: 7 [IU] via SUBCUTANEOUS
  Filled 2015-02-27: qty 0.07

## 2015-02-27 MED ORDER — INFLUENZA VAC SPLIT QUAD 0.5 ML IM SUSY: 0.5000 mL | PREFILLED_SYRINGE | INTRAMUSCULAR | Status: DC

## 2015-02-27 MED ORDER — INFLUENZA VAC SPLIT QUAD 0.5 ML IM SUSY
0.5000 mL | PREFILLED_SYRINGE | INTRAMUSCULAR | Status: AC
Start: 2015-02-28 — End: 2015-03-04
  Administered 2015-03-04: 0.5 mL via INTRAMUSCULAR
  Filled 2015-02-27 (×3): qty 0.5

## 2015-02-27 MED ORDER — INSULIN ASPART 100 UNIT/ML ~~LOC~~ SOLN: 0.0000 [IU] | Freq: Every day | SUBCUTANEOUS | Status: DC

## 2015-02-27 MED ORDER — HEPARIN SODIUM (PORCINE) 5000 UNIT/ML IJ SOLN
5000.0000 [IU] | Freq: Three times a day (TID) | INTRAMUSCULAR | Status: DC
  Administered 2015-02-27 – 2015-03-02 (×9): 5000 [IU] via SUBCUTANEOUS
  Filled 2015-02-27 (×11): qty 1

## 2015-02-27 MED ORDER — INSULIN GLARGINE 100 UNIT/ML ~~LOC~~ SOLN
8.0000 [IU] | Freq: Once | SUBCUTANEOUS | Status: AC
  Administered 2015-02-27: 8 [IU] via SUBCUTANEOUS
  Filled 2015-02-27 (×2): qty 0.08

## 2015-02-27 MED ORDER — CARVEDILOL 25 MG PO TABS
25.0000 mg | ORAL_TABLET | Freq: Two times a day (BID) | ORAL | Status: DC
  Administered 2015-02-27 – 2015-03-01 (×5): 25 mg via ORAL
  Filled 2015-02-27 (×5): qty 1

## 2015-02-27 MED ORDER — ACETAMINOPHEN 325 MG PO TABS
650.0000 mg | ORAL_TABLET | Freq: Four times a day (QID) | ORAL | Status: DC | PRN
  Administered 2015-02-28: 650 mg via ORAL
  Filled 2015-02-27: qty 2

## 2015-02-27 MED ORDER — ACETAMINOPHEN 650 MG RE SUPP: 650.0000 mg | Freq: Four times a day (QID) | RECTAL | Status: DC | PRN

## 2015-02-27 MED ORDER — GUAIFENESIN-DM 100-10 MG/5ML PO SYRP
5.0000 mL | ORAL_SOLUTION | ORAL | Status: DC | PRN
  Administered 2015-02-27 – 2015-03-05 (×7): 5 mL via ORAL
  Filled 2015-02-27: qty 10
  Filled 2015-02-27: qty 5
  Filled 2015-02-27 (×5): qty 10

## 2015-02-27 NOTE — Progress Notes (Signed)
[facility] PROGRESS NOTE   Edward Colon [id number] DOB: [birth year] DOA: 02/27/2015 PCP: [name], MD  HPI/Subjective: Denies new complaints, was okay overnight.  Assessment/Plan: Principal Problem:   Acute on chronic renal failure (HCC) Active Problems:   Seizures (HCC)   Hypertension   Hyperkalemia   Hypoglycemia   Anemia   URI (upper respiratory infection)   Acute-on-chronic kidney injury ([place])   This is a no charge note, pt seen earlier today by my colleague Dr. Loleta Books. I have seen and examined the Pt.  Acute on chronic renal failure:  This is new. Patient is on transplant list at [facility]. Chronic renal failure is from T1DM.  Now with several days emesis and sore throat, worsening renal failure.  Hopefully this is prerenal from poor PO intake and will improve with fluids.  Also considered that this is progression of disease.  UA without findings, has mildly stenosis and no evidence of fluid overload on exam, has mild hyperkalemia. Started on IV fluids for aggressive hydration, monitor intake/output closely. Check BMP in a.m.  Hyperkalemia:  This is new.  Correction insulin and trend BMP  Hypoglycemia in IDDM:  Diabetes mellitus with complications including CKD and in since that neuropathy. Restarted Lantus insulin at lower dose because of the renal failure, give 15 units. Sliding scale corrections only  HTN:  Continue with amlodipine and carvedilol, last continued by Cardiology in Nov. Patient thinks he takes hydralazine, which they had asked him to hold -Continue amlodipine and carvedilol for now -Add hydralazine PRN as needed   Anemia of renal failure:  Stable.   History of seizures:  Stable.  Continue levetiracetam  Cough: Suspect this is a URI, with malaise, sore throat, congestion, cough.  -Hemoptysis suspected from bronchitis, but will obtain CXR.  Code Status: Full Code Family Communication: Plan discussed  with the patient. Disposition Plan: Remains inpatient Diet: Diet renal/carb modified with fluid restriction Diet-HS Snack?: Nothing; Room service appropriate?: Yes; Fluid consistency:: Thin  Consultants:  None  Procedures:  None*  Antibiotics:  None   Objective: Filed Vitals:   02/27/15 0442 02/27/15 0706  BP: 165/88 178/98  Pulse: 90 84  Temp:  98.9 F (37.2 C)  Resp: 18 18    Intake/Output Summary (Last 24 hours) at 02/27/15 1308 Last data filed at 02/27/15 0930  Gross per 24 hour  Intake    300 ml  Output      0 ml  Net    300 ml   Filed Weights   02/27/15 0706  Weight: 94.5 kg (208 lb 5.4 oz)    Exam: General: Alert and awake, oriented x3, not in any acute distress. HEENT: anicteric sclera, pupils reactive to light and accommodation, EOMI CVS: S1-S2 clear, no murmur rubs or gallops Chest: clear to auscultation bilaterally, no wheezing, rales or rhonchi Abdomen: soft nontender, nondistended, normal bowel sounds, no organomegaly Extremities: no cyanosis, clubbing or edema noted bilaterally Neuro: Cranial nerves II-XII intact, no focal neurological deficits  Data Reviewed: Basic Metabolic Panel:  Recent Labs Lab 02/27/15 0148 02/27/15 0839  NA 138  --   K 5.5*  --   CL 106  --   CO2 21*  --   GLUCOSE 372* 489*  BUN 55*  --   CREATININE 7.22*  --   CALCIUM 6.9*  --    Liver Function Tests:  Recent Labs Lab 02/27/15 0148  AST 22  ALT 16*  ALKPHOS 95  BILITOT 0.6  PROT 6.6  ALBUMIN 2.8*  No results for input(s): LIPASE, AMYLASE in the last 168 hours. No results for input(s): AMMONIA in the last 168 hours. CBC:  Recent Labs Lab 02/27/15 0148  WBC 8.3  HGB 10.0*  HCT 30.0*  MCV 82.2  PLT 275   Cardiac Enzymes: No results for input(s): CKTOTAL, CKMB, CKMBINDEX, TROPONINI in the last 168 hours. BNP (last 3 results) No results for input(s): BNP in the last 8760 hours.  ProBNP (last 3 results) No results for input(s): PROBNP in  the last 8760 hours.  CBG:  Recent Labs Lab 02/27/15 0636 02/27/15 0817 02/27/15 1152  GLUCAP 482* 473* 419*    Micro No results found for this or any previous visit (from the past 240 hour(s)).   Studies: Dg Chest 2 View  02/27/2015  CLINICAL DATA:  Cough for several days.  Hypertension. EXAM: CHEST  2 VIEW COMPARISON:  October 26, 2013 FINDINGS: Lungs are clear. Heart size and pulmonary vascularity are normal. No adenopathy. No bone lesions. IMPRESSION: No edema or consolidation. Electronically Signed   By: Lowella Grip III M.D.   On: 02/27/2015 08:22    Scheduled Meds: . amLODipine  10 mg Oral Daily  . calcitRIOL  0.25 mcg Oral Daily  . carvedilol  25 mg Oral BID  . heparin  5,000 Units Subcutaneous 3 times per day  . [START ON 02/28/2015] Influenza vac split quadrivalent PF  0.5 mL Intramuscular Tomorrow-1000  . insulin aspart  0-9 Units Subcutaneous TID WC  . insulin glargine  7 Units Subcutaneous q morning - 10a  . levETIRAcetam  500 mg Oral BID   Continuous Infusions:      Time spent: 35 minutes    [facility]  [facility] Pager [phone] If 7PM-7AM, please contact night-coverage at [email or web address], password [facility] 02/27/2015, 1:08 PM  LOS: 0 days

## 2015-02-27 NOTE — H&P (Signed)
History and Physical  Patient Name: Edward Colon     [id number]    DOB: May 08, 1980    DOA: 02/27/2015 Referring physician: Delora Fuel, MD PCP: [name], MD      Chief Complaint: Hypoglycemia and malaise  HPI: Edward Colon is a 34 y.o. male with a past medical history significant for epilepsy, T1DM c/b CKD stage IV, renovascular HTN, and anemia of renal disease who presents after a hypoglycemic episode.  The patient reports several days onset of cough, sore throat, congestion, and malaise with vomiting and polyuria this week until today, when he had a hypogylcemic episode and passed out at home for a few minutes.  EMS were called, administered glucagon and glucose, and he recovered and was brought to the ER.  In the ED, the patient was found to have acute on chronic renal failure with creatinine 7, bland urinalysis, normal anion gap, mildly elevated potassium 5.5 mmol/L.  TRH were asked to admit for acute on chronic renal failure.  Other than vomiting and poor PO intake, the patient denies other precipitating factors such as new medicines, recent NSAID use, or recent contrast exposure.  He has been taking his Lantus and humalog as prescribed and not missing doses.     Review of Systems:  Pt complains of cough, myalgias, sore throat, malaise, a recent seizure this week, hemoptysis, fever, hypoglycemia. Pt denies any rigors, recent hyperglycemia.  All other systems negative except as just noted or noted in the history of present illness.  No Known Allergies  Prior to Admission medications   Medication Sig Start Date End Date Taking? Authorizing Provider  amLODipine (NORVASC) 10 MG tablet Take 1 tablet (10 mg total) by mouth daily. 11/07/14  Yes Skeet Latch, MD  calcitRIOL (ROCALTROL) 0.25 MCG capsule Take 0.25 mcg by mouth daily. 10/10/14  Yes Historical Provider, MD  carvedilol (COREG) 25 MG tablet Take 25 mg by mouth 2 (two) times daily. 10/07/14  Yes Historical  Provider, MD  furosemide (LASIX) 40 MG tablet Take 40 mg by mouth 2 (two) times daily.    Yes Historical Provider, MD  insulin glargine (LANTUS) 100 UNIT/ML injection Inject 0.15 mLs (15 Units total) into the skin every morning. Patient taking differently: Inject 20 Units into the skin every morning.  09/02/14  Yes Silver Huguenin Elgergawy, MD  insulin lispro (HUMALOG) 100 UNIT/ML injection Inject 2-10 Units into the skin 3 (three) times daily as needed for high blood sugar (high blood sugar).    Yes Historical Provider, MD  levETIRAcetam (KEPPRA) 500 MG tablet Take 1 tablet (500 mg total) by mouth 2 (two) times daily. 06/04/13  Yes Barton Dubois, MD    Past Medical History  Diagnosis Date  . Diabetes mellitus without complication ([place])   . Hypertension   . Seizures ([place])   . Chronic kidney disease   . Type 1 diabetes ([place])     History reviewed. No pertinent past surgical history.  Family history: family history includes Hypertension in his father and mother.  Social History: Patient lives with his parents.  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  He does not smoker or drink.       Physical Exam: BP 165/88 mmHg  Pulse 90  Temp(Src) 97.5 F (36.4 C) (Oral)  Resp 18  SpO2 97% General appearance: Well-developed, adult male, tired and appears uncofmortable and weak. Eyes: Anicteric, conjunctiva pink, lids and lashes normal.     ENT: No nasal deformity, discharge, or epistaxis.  OP  moist without posterior erythema.   Lymph: No cervical, supraclavicular lymphadenopathy. Skin: Warm and dry.   Cardiac: Tachycardic, regular, nl S1-S2, no murmurs appreciated.  Capillary refill is brisk.  No LE edema.   Respiratory: Normal respiratory rate and rhythm.  CTAB without rales or wheezes. Abdomen: Abdomen soft without rigidity.  No TTP. No ascites, distension.   MSK: No deformities or effusions. Neuro: Sensorium intact and responding to questions, attention normal.  Speech is fluent.  Moves all  extremities equally and with normal coordination.    Psych: Behavior appropriate.  Affect normal.  No evidence of aural or visual hallucinations or delusions.       Labs on Admission:  The metabolic panel shows potassium mildly high, bicarbonate slightly low, creatinine 7.22 from previous 3.6-4, normal AG. Hyperglycemia. Transaminases and bilirubin normal. Urinalysis without blood or WBCs. The complete blood count shows chronic normocytic anemia, no leukocytosis.   EKG: Independently reviewed. No peaked T waves.  Rate 91, QTc long at 490.    Assessment/Plan 1. Acute on chronic renal failure:  This is new.  Patient is on transplant list at [facility].  Chronic renal failure is from T1DM.  Now with several days emesis and sore throat, worsening renal failure.  Hopefully this is prerenal from poor PO intake and will improve with fluids.  Also considered that this is progression of disease.  UA bland.  Mild acidosis at this time, no evidence of fluid overload on exam, mild hyperkalemia. -IV fluid challenge -Hold furosemide for now -Consult to nephrology   2. Hyperkalemia:  This is new.   -Correction insulin and trend BMP  3. Hypoglycemia in IDDM:  Patient hypogylcemic at admission, likely from renal failure. -Decrease glargine to 7 units from 20 units -Sliding scale corrections only  4. HTN:  Continue with amlodipine and carvedilol, last continued by Cardiology in Nov. Patient thinks he takes hydralazine, which they had asked him to hold -Continue amlodipine and carvedilol for now -Add hydralazine PRN as needed   5. Anemia of renal failure:  Stable.   6. History of seizures:  Stable.  Continue levetiracetam  7. Cough: Suspect this is a URI, with malaise, sore throat, congestion, cough.    -Hemoptysis suspected from bronchitis, but will obtain CXR.      DVT PPx: Heparin Diet: Renal, carb reduced Consultants: Nephrology Code Status: Full Family Communication: None    Medical decision making: What exists of the patient's previous chart was reviewed in depth and the case was discussed with Dr. Roxanne Mins. Patient seen 6:43 AM on 02/27/2015.  Disposition Plan:  Admit to inpatient for monitoring of renal function and consultation with nephrology.      Edwin Dada [facility] Pager [phone]

## 2015-02-27 NOTE — ED Notes (Signed)
According to EMS, pt was found on the floor of his bedroom, diaphoretic and breathing heavily. Upon [employer]. Arrival pts CBG was 30. EMS administered 1mg  of Glucagon IM, in addition to peanut butter sandwich and milk causing CBG to be 113. Pt arrives to [facility] ED, visibly shivering c/o of being cold, tired and hungry, A+OX4, denying pain, speaking in complete sentences.

## 2015-02-27 NOTE — Progress Notes (Signed)
Inpatient Diabetes Program Recommendations  AACE/ADA: New Consensus Statement on Inpatient Glycemic Control (2015)  Target Ranges:  Prepandial:   less than 140 mg/dL      Peak postprandial:   less than 180 mg/dL (1-2 hours)      Critically ill patients:  140 - 180 mg/dL   Results for VEERAJ, NAEEM (MRN [id number]) as of 02/27/2015 12:55  Ref. Range 02/27/2015 06:36 02/27/2015 08:17 02/27/2015 11:52  Glucose-Capillary Latest Ref Range: 65-99 mg/dL 482 (H) 473 (H) 419 (H)    Admit Hypoglycemia/ Acute on Chronic Renal Failure.   History: DM Type 1 since childhood, CKD4, HTN  Home Insulin: Lantus 20 units QAM              Humalog 2-10 units tid  Current Orders: Lantus 7 units QAM       Novolog Sensitive SSI (0-9 units) TID AC     -Note Lantus and Novolog started this AM.  -Currently only getting a portion of home doses of insulin.  -Ate 100% breakfast this AM.    MD- Patient is insulin deficient.  Has History of Type 1 DM since age 34.  Will need insulin to cover his food intake.  Please consider the following:  1. Increase Lantus to 15 units daily (75% of home dose)  2. Start Novolog Meal Coverage- Novolog 4 units tid with meals to start     --Will follow patient during hospitalization--  Wyn Quaker RN, MSN, CDE Diabetes Coordinator Inpatient Glycemic Control Team Team Pager: [phone] (8a-5p)

## 2015-02-27 NOTE — Care Management Note (Signed)
Case Management Note  Patient Details  Name: Edward Colon MRN: [id number] Date of Birth: [birth year]  Subjective/Objective: 34 y/o m admitted w/DM, Acute on chronic renal failure. From home.                   Action/Plan:d/c plan home.   Expected Discharge Date:                  Expected Discharge Plan:  Home/Self Care  In-House Referral:     Discharge planning Services  CM Consult  Post Acute Care Choice:    Choice offered to:     DME Arranged:    DME Agency:     HH Arranged:    HH Agency:     Status of Service:  In process, will continue to follow  Medicare Important Message Given:    Date Medicare IM Given:    Medicare IM give by:    Date Additional Medicare IM Given:    Additional Medicare Important Message give by:     If discussed at [place] of Stay Meetings, dates discussed:    Additional Comments:  Dessa Phi, RN 02/27/2015, 1:25 PM

## 2015-02-27 NOTE — ED Notes (Signed)
Writer attempted to draw blood work, unsuccessful attempt

## 2015-02-27 NOTE — ED Notes (Signed)
CBG 172 

## 2015-02-27 NOTE — ED Provider Notes (Signed)
CSN: [id number]     Arrival date & time 02/27/15  0031 History  By signing my name below, I, [name], attest that this documentation has been prepared under the direction and in the presence of Delora Fuel, MD. Electronically Signed: [name], ED Scribe. 02/27/2015. 1:18 AM.   Chief Complaint  Patient presents with  . Hypoglycemia  . Loss of Consciousness      The history is provided by the patient. No language interpreter was used.    HPI Comments Edward Colon is a 34 y.o. male with a medical hx of DM, HTN, Seizures, and CKD who presents to the Emergency Department via EMS complaining of LOC onset PTA. He reports that he has taken his insulin as he should and that he hasn't missed any meals. He states that he can eat and that he will pass out. He reports that he feels better currently while in the ED. He notes that he does live with someone. Pt checks his blood sugar at home and notes that they have been pretty good. He denies any other symptoms. Pt denies seeing a diabetic specialist but he does see a kidney specialist and neurologist.   PCP: Dr. Nolene Ebbs   Per EMS pt was found on the floor of his bedroom, and was breathing heavy and diaphoretic. Pt CBG was initially 30 to which it rose to 113 after administration of 1 mg Glucagon, peanut butter sandwich and milk.   Past Medical History  Diagnosis Date  . Diabetes mellitus without complication ([place])   . Hypertension   . Seizures ([place])   . Chronic kidney disease   . Type 1 diabetes ([place])    History reviewed. No pertinent past surgical history. Family History  Problem Relation Age of Onset  . Hypertension Mother   . Hypertension Father    Social History  Substance Use Topics  . Smoking status: Never Smoker   . Smokeless tobacco: Never Used  . Alcohol Use: No    Review of Systems  Neurological: Positive for syncope.  All other systems reviewed and are negative.   Allergies  Review of patient's  allergies indicates no known allergies.  Home Medications   Prior to Admission medications   Medication Sig Start Date End Date Taking? Authorizing Provider  amLODipine (NORVASC) 10 MG tablet Take 1 tablet (10 mg total) by mouth daily. 11/07/14  Yes Skeet Latch, MD  calcitRIOL (ROCALTROL) 0.25 MCG capsule Take 0.25 mcg by mouth daily. 10/10/14  Yes Historical Provider, MD  carvedilol (COREG) 25 MG tablet Take 25 mg by mouth 2 (two) times daily. 10/07/14  Yes Historical Provider, MD  furosemide (LASIX) 40 MG tablet Take 40 mg by mouth 2 (two) times daily.    Yes Historical Provider, MD  insulin glargine (LANTUS) 100 UNIT/ML injection Inject 0.15 mLs (15 Units total) into the skin every morning. Patient taking differently: Inject 20 Units into the skin every morning.  09/02/14  Yes Silver Huguenin Elgergawy, MD  insulin lispro (HUMALOG) 100 UNIT/ML injection Inject 2-10 Units into the skin 3 (three) times daily as needed for high blood sugar (high blood sugar).    Yes Historical Provider, MD  levETIRAcetam (KEPPRA) 500 MG tablet Take 1 tablet (500 mg total) by mouth 2 (two) times daily. 06/04/13  Yes Barton Dubois, MD   BP 193/104 mmHg  Pulse 85  Temp(Src) 97.5 F (36.4 C) (Oral)  Resp 22  SpO2 99% Physical Exam  Constitutional: He is oriented to person, place,  and time. He appears well-developed and well-nourished. No distress.  HENT:  Head: Normocephalic and atraumatic.  Eyes: EOM are normal. Pupils are equal, round, and reactive to light.  Neck: Normal range of motion. Neck supple. No JVD present.  Cardiovascular: Normal rate and regular rhythm.  Exam reveals no gallop and no friction rub.   Murmur heard.  Systolic murmur is present with a grade of 2/6  2/6 systolic ejection murmur   Pulmonary/Chest: Effort normal and breath sounds normal. He has no wheezes. He has no rales. He exhibits no tenderness.  Abdominal: Soft. Bowel sounds are normal. He exhibits no distension and no mass. There is no  tenderness.  Musculoskeletal: Normal range of motion. He exhibits no edema.  Lymphadenopathy:    He has no cervical adenopathy.  Neurological: He is alert and oriented to person, place, and time. No cranial nerve deficit. He exhibits normal muscle tone. Coordination normal.  Skin: Skin is warm and dry. No rash noted.  Psychiatric: He has a normal mood and affect. His behavior is normal. Judgment and thought content normal.  Nursing note and vitals reviewed.   ED Course  Procedures (including critical care time) DIAGNOSTIC STUDIES: Oxygen Saturation is 99% on RA, nl by my interpretation.    COORDINATION OF CARE: 1:18 AM Discussed treatment plan with pt at bedside which includes EKG, labs, and pt agreed to plan.    Labs Review Results for orders placed or performed during the hospital encounter of 02/27/15  CBC  Result Value Ref Range   WBC 8.3 4.0 - 10.5 K/uL   RBC 3.65 (L) 4.22 - 5.81 MIL/uL   Hemoglobin 10.0 (L) 13.0 - 17.0 g/dL   HCT 30.0 (L) 39.0 - 52.0 %   MCV 82.2 78.0 - 100.0 fL   MCH 27.4 26.0 - 34.0 pg   MCHC 33.3 30.0 - 36.0 g/dL   RDW 14.2 11.5 - 15.5 %   Platelets 275 150 - 400 K/uL  Comprehensive metabolic panel  Result Value Ref Range   Sodium 138 135 - 145 mmol/L   Potassium 5.5 (H) 3.5 - 5.1 mmol/L   Chloride 106 101 - 111 mmol/L   CO2 21 (L) 22 - 32 mmol/L   Glucose, Bld 372 (H) 65 - 99 mg/dL   BUN 55 (H) 6 - 20 mg/dL   Creatinine, Ser 7.22 (H) 0.61 - 1.24 mg/dL   Calcium 6.9 (L) 8.9 - 10.3 mg/dL   Total Protein 6.6 6.5 - 8.1 g/dL   Albumin 2.8 (L) 3.5 - 5.0 g/dL   AST 22 15 - 41 U/L   ALT 16 (L) 17 - 63 U/L   Alkaline Phosphatase 95 38 - 126 U/L   Total Bilirubin 0.6 0.3 - 1.2 mg/dL   GFR calc non Af Amer 9 (L) >60 mL/min   GFR calc Af Amer 10 (L) >60 mL/min   Anion gap 11 5 - 15  Urinalysis, Routine w reflex microscopic (not at [facility])  Result Value Ref Range   Color, Urine YELLOW YELLOW   APPearance CLEAR CLEAR   Specific Gravity, Urine 1.012  1.005 - 1.030   pH 6.5 5.0 - 8.0   Glucose, UA 250 (A) NEGATIVE mg/dL   Hgb urine dipstick MODERATE (A) NEGATIVE   Bilirubin Urine NEGATIVE NEGATIVE   Ketones, ur NEGATIVE NEGATIVE mg/dL   Protein, ur >300 (A) NEGATIVE mg/dL   Nitrite NEGATIVE NEGATIVE   Leukocytes, UA NEGATIVE NEGATIVE  Urine microscopic-add on  Result Value Ref Range  Squamous Epithelial / LPF 0-5 (A) NONE SEEN   WBC, UA 0-5 0 - 5 WBC/hpf   RBC / HPF 0-5 0 - 5 RBC/hpf   Bacteria, UA NONE SEEN NONE SEEN   I have personally reviewed and evaluated these images and lab results as part of my medical decision-making.   EKG Interpretation   Date/Time:  Friday February 27 2015 00:50:23 EST Ventricular Rate:  91 PR Interval:  139 QRS Duration: 84 QT Interval:  401 QTC Calculation: 493 R Axis:   69 Text Interpretation:  Sinus rhythm Prolonged QT interval When compared  with ECG of  08/30/2014, QT has lengthened Confirmed by [facility]  MD, Oletha Tolson  ([id number]) on 02/27/2015 1:08:12 AM      MDM   Final diagnoses:  Hypoglycemia  Acute on chronic renal failure (HCC)  Essential hypertension    Episode of hypoglycemia. Patient does have renal failure which may prolong the action of insulin and may be related to his labile blood sugars. Blood sugar was adequate on arrival here after treatment by EMS. Screening labs were obtained and his creatinine is noted to be over 7. Last creatinine we had on him was in the range of 4. I reviewed his record on care everywhere and he has been evaluated by the transplant team at [facility] and creatinine on August 18 was 5.5. Worsening renal failure may be related to his hypoglycemic episode. He needs to be admitted to try to improve his renal function. If unable to do so, he will need to start dialysis. Have explained this to the patient. Cases been discussed with Dr. Loleta Books of [facility] who agrees to admit the patient.  I personally performed the services described in  this documentation, which was scribed in my presence. The recorded information has been reviewed and is accurate.      Delora Fuel, MD [phone]

## 2015-02-27 NOTE — ED Notes (Signed)
Bed: WA17 Expected date:  Expected time:  Means of arrival:  Comments: EMS hypoglycemia 

## 2015-02-28 ENCOUNTER — Inpatient Hospital Stay (HOSPITAL_COMMUNITY): Payer: Medicare Other

## 2015-02-28 LAB — CBC
HCT: 22.3 % — ABNORMAL LOW (ref 39.0–52.0)
HEMOGLOBIN: 7.4 g/dL — AB (ref 13.0–17.0)
MCH: 26.8 pg (ref 26.0–34.0)
MCHC: 33.2 g/dL (ref 30.0–36.0)
MCV: 80.8 fL (ref 78.0–100.0)
Platelets: 227 10*3/uL (ref 150–400)
RBC: 2.76 MIL/uL — AB (ref 4.22–5.81)
RDW: 14.2 % (ref 11.5–15.5)
WBC: 3.3 10*3/uL — AB (ref 4.0–10.5)

## 2015-02-28 LAB — GLUCOSE, CAPILLARY
GLUCOSE-CAPILLARY: 277 mg/dL — AB (ref 65–99)
GLUCOSE-CAPILLARY: 305 mg/dL — AB (ref 65–99)
GLUCOSE-CAPILLARY: 144 mg/dL — AB (ref 65–99)
GLUCOSE-CAPILLARY: 204 mg/dL — AB (ref 65–99)

## 2015-02-28 LAB — RENAL FUNCTION PANEL
ANION GAP: 11 (ref 5–15)
Albumin: 2.4 g/dL — ABNORMAL LOW (ref 3.5–5.0)
BUN: 63 mg/dL — ABNORMAL HIGH (ref 6–20)
CALCIUM: 6.6 mg/dL — AB (ref 8.9–10.3)
CO2: 20 mmol/L — AB (ref 22–32)
Chloride: 106 mmol/L (ref 101–111)
Creatinine, Ser: 7.94 mg/dL — ABNORMAL HIGH (ref 0.61–1.24)
GFR, EST AFRICAN AMERICAN: 9 mL/min — AB (ref >60)
GFR, EST NON AFRICAN AMERICAN: 8 mL/min — AB (ref >60)
Glucose, Bld: 301 mg/dL — ABNORMAL HIGH (ref 65–99)
PHOSPHORUS: 4.8 mg/dL — AB (ref 2.5–4.6)
Potassium: 4.8 mmol/L (ref 3.5–5.1)
SODIUM: 137 mmol/L (ref 135–145)

## 2015-02-28 LAB — IRON AND TIBC
IRON: 40 ug/dL — AB (ref 45–182)
Saturation Ratios: 20 % (ref 17.9–39.5)
TIBC: 202 ug/dL — AB (ref 250–450)
UIBC: 162 ug/dL

## 2015-02-28 MED ORDER — INSULIN GLARGINE 100 UNIT/ML ~~LOC~~ SOLN
20.0000 [IU] | Freq: Every morning | SUBCUTANEOUS | Status: DC
  Administered 2015-03-01: 20 [IU] via SUBCUTANEOUS
  Filled 2015-02-28: qty 0.2

## 2015-02-28 MED ORDER — INSULIN GLARGINE 100 UNIT/ML ~~LOC~~ SOLN
5.0000 [IU] | Freq: Once | SUBCUTANEOUS | Status: AC
  Administered 2015-02-28: 5 [IU] via SUBCUTANEOUS
  Filled 2015-02-28: qty 0.05

## 2015-02-28 MED ORDER — SODIUM CHLORIDE 0.9 % IV SOLN
INTRAVENOUS | Status: DC
  Administered 2015-02-28 – 2015-03-03 (×4): via INTRAVENOUS

## 2015-02-28 NOTE — Progress Notes (Signed)
[facility] PROGRESS NOTE   Edward Colon [id number] DOB: August 17, 1980 DOA: 02/27/2015 PCP: [name], MD  HPI/Subjective: Seen with girlfriend at bedside, denies any complaints. Made about 1.3 L of urine, continue IV fluids.  Assessment/Plan: Principal Problem:   Acute on chronic renal failure (HCC) Active Problems:   Seizures (HCC)   Hypertension   Hyperkalemia   Hypoglycemia   Anemia   URI (upper respiratory infection)   Acute-on-chronic kidney injury ([place])    Acute on chronic renal failure:  This is new. Patient is on transplant list at [facility]. Chronic renal failure is from T1DM.  Now with several days emesis and sore throat, worsening renal failure.  Hopefully this is prerenal from poor PO intake and will improve with fluids.  Also considered that this is progression of disease.  UA without findings, has mildly stenosis and no evidence of fluid overload on exam, has mild hyperkalemia. Started on IV fluids for aggressive hydration, monitor intake/output closely. Check BMP daily.  Hyperkalemia:  This is new.  This is resolved after fluid hydration.  Hypoglycemia in IDDM:  Diabetes mellitus with complications including CKD and insensate neuropathy Even 15 units of Lantus yesterday, fasting blood sugar is 301, increase Lantus to 20 units. Sliding scale corrections only  HTN:  Continue with amlodipine and carvedilol, last continued by Cardiology in Nov. Patient thinks he takes hydralazine, which they had asked him to hold -Continue amlodipine and carvedilol for now -Add hydralazine PRN as needed, need better control of blood pressure to help improve the kidney function.  Anemia of renal failure:  Hemoglobin was 10.0 on 12/23, hemoglobin dropped to 7.4 this morning. This is likely secondary to hemodilution, continue to follow hemoglobin, if drop further he might needs transfusion.  History of seizures:  Stable.  Continue  levetiracetam  Cough: Suspect this is a URI, with malaise, sore throat, congestion, cough.  Chest x-ray without findings.   Code Status: Full Code Family Communication: Plan discussed with the patient. Disposition Plan: Remains inpatient Diet: Diet renal/carb modified with fluid restriction Diet-HS Snack?: Nothing; Room service appropriate?: Yes; Fluid consistency:: Thin  Consultants:  None  Procedures:  None*  Antibiotics:  None   Objective: Filed Vitals:   02/27/15 2027 02/28/15 0530  BP: 165/93 162/90  Pulse: 91 83  Temp: 98.7 F (37.1 C) 98.1 F (36.7 C)  Resp: 19 19    Intake/Output Summary (Last 24 hours) at 02/28/15 1037 Last data filed at 02/28/15 0531  Gross per 24 hour  Intake    920 ml  Output   1320 ml  Net   -400 ml   Filed Weights   02/27/15 0706  Weight: 94.5 kg (208 lb 5.4 oz)    Exam: General: Alert and awake, oriented x3, not in any acute distress. HEENT: anicteric sclera, pupils reactive to light and accommodation, EOMI CVS: S1-S2 clear, no murmur rubs or gallops Chest: clear to auscultation bilaterally, no wheezing, rales or rhonchi Abdomen: soft nontender, nondistended, normal bowel sounds, no organomegaly Extremities: no cyanosis, clubbing or edema noted bilaterally Neuro: Cranial nerves II-XII intact, no focal neurological deficits  Data Reviewed: Basic Metabolic Panel:  Recent Labs Lab 02/27/15 0148 02/27/15 0839 02/28/15 0544  NA 138  --  137  K 5.5*  --  4.8  CL 106  --  106  CO2 21*  --  20*  GLUCOSE 372* 489* 301*  BUN 55*  --  63*  CREATININE 7.22*  --  7.94*  CALCIUM 6.9*  --  6.6*  PHOS  --   --  4.8*   Liver Function Tests:  Recent Labs Lab 02/27/15 0148 02/28/15 0544  AST 22  --   ALT 16*  --   ALKPHOS 95  --   BILITOT 0.6  --   PROT 6.6  --   ALBUMIN 2.8* 2.4*   No results for input(s): LIPASE, AMYLASE in the last 168 hours. No results for input(s): AMMONIA in the last 168  hours. CBC:  Recent Labs Lab 02/27/15 0148 02/28/15 0544  WBC 8.3 3.3*  HGB 10.0* 7.4*  HCT 30.0* 22.3*  MCV 82.2 80.8  PLT 275 227   Cardiac Enzymes: No results for input(s): CKTOTAL, CKMB, CKMBINDEX, TROPONINI in the last 168 hours. BNP (last 3 results) No results for input(s): BNP in the last 8760 hours.  ProBNP (last 3 results) No results for input(s): PROBNP in the last 8760 hours.  CBG:  Recent Labs Lab 02/27/15 1152 02/27/15 1501 02/27/15 1700 02/27/15 2030 02/28/15 0805  GLUCAP 419* 184* 132* 235* 277*    Micro No results found for this or any previous visit (from the past 240 hour(s)).   Studies: Dg Chest 2 View  02/27/2015  CLINICAL DATA:  Cough for several days.  Hypertension. EXAM: CHEST  2 VIEW COMPARISON:  October 26, 2013 FINDINGS: Lungs are clear. Heart size and pulmonary vascularity are normal. No adenopathy. No bone lesions. IMPRESSION: No edema or consolidation. Electronically Signed   By: Lowella Grip III M.D.   On: 02/27/2015 08:22    Scheduled Meds: . amLODipine  10 mg Oral Daily  . calcitRIOL  0.25 mcg Oral Daily  . carvedilol  25 mg Oral BID  . heparin  5,000 Units Subcutaneous 3 times per day  . Influenza vac split quadrivalent PF  0.5 mL Intramuscular Tomorrow-1000  . insulin aspart  0-9 Units Subcutaneous TID WC  . insulin glargine  15 Units Subcutaneous q morning - 10a  . levETIRAcetam  500 mg Oral BID   Continuous Infusions: . sodium chloride         Time spent: 35 minutes    ELMAHI,MUTAZ A  [facility] Pager [phone] If 7PM-7AM, please contact night-coverage at [email or web address], password [facility] 02/28/2015, 10:37 AM  LOS: 1 day

## 2015-02-28 NOTE — Consult Note (Signed)
Referring Provider: No ref. provider found Primary Care Physician:  [name], MD Primary Nephrologist:  Dr. Moshe Cipro  Reason for Consultation:  Chronic renal disease  Acute renal failure and HTN  HPI: Edward Colon is a 34 y.o. male with a past medical history significant for epilepsy, T1DM c/b CKD stage IV, renovascular HTN, and anemia of renal disease who presents after a hypoglycemic episode.  In the ED, the patient was found to have acute on chronic renal failure with creatinine 7,baseline creatinine was 5  bland urinalysis, normal anion gap, mildly elevated potassium 5.5 mmol/L. TR[facility] were asked to admit for acute on chronic renal failure. He has been evaluated at [facility] for a renal transplant  Other than vomiting and poor PO intake, the patient denies other precipitating factors such as new medicines, recent NSAID use, or recent contrast exposure. He has been taking his Lantus and humalog as prescribed and not missing doses.  Past Medical History  Diagnosis Date  . Diabetes mellitus without complication ([place])   . Hypertension   . Seizures ([place])   . Chronic kidney disease   . Type 1 diabetes ([place])     History reviewed. No pertinent past surgical history.  Prior to Admission medications   Medication Sig Start Date End Date Taking? Authorizing Provider  amLODipine (NORVASC) 10 MG tablet Take 1 tablet (10 mg total) by mouth daily. 11/07/14  Yes Skeet Latch, MD  calcitRIOL (ROCALTROL) 0.25 MCG capsule Take 0.25 mcg by mouth daily. 10/10/14  Yes Historical Provider, MD  carvedilol (COREG) 25 MG tablet Take 25 mg by mouth 2 (two) times daily. 10/07/14  Yes Historical Provider, MD  furosemide (LASIX) 40 MG tablet Take 40 mg by mouth 2 (two) times daily.    Yes Historical Provider, MD  insulin glargine (LANTUS) 100 UNIT/ML injection Inject 0.15 mLs (15 Units total) into the skin every morning. Patient taking differently: Inject 20 Units into the skin every morning.   09/02/14  Yes Silver Huguenin Elgergawy, MD  insulin lispro (HUMALOG) 100 UNIT/ML injection Inject 2-10 Units into the skin 3 (three) times daily as needed for high blood sugar (high blood sugar).    Yes Historical Provider, MD  levETIRAcetam (KEPPRA) 500 MG tablet Take 1 tablet (500 mg total) by mouth 2 (two) times daily. 06/04/13  Yes Barton Dubois, MD    Current Facility-Administered Medications  Medication Dose Route Frequency Provider Last Rate Last Dose  . 0.9 %  sodium chloride infusion   Intravenous Continuous Verlee Monte, MD 100 mL/hr at 02/28/15 1047    . acetaminophen (TYLENOL) tablet 650 mg  650 mg Oral Q6H PRN Edwin Dada, MD   650 mg at 02/28/15 [id number]   Or  . acetaminophen (TYLENOL) suppository 650 mg  650 mg Rectal Q6H PRN Edwin Dada, MD      . amLODipine (NORVASC) tablet 10 mg  10 mg Oral Daily Edwin Dada, MD   10 mg at 02/28/15 0851  . calcitRIOL (ROCALTROL) capsule 0.25 mcg  0.25 mcg Oral Daily Edwin Dada, MD   0.25 mcg at 02/28/15 0851  . carvedilol (COREG) tablet 25 mg  25 mg Oral BID Edwin Dada, MD   25 mg at 02/28/15 0851  . guaiFENesin-dextromethorphan (ROBITUSSIN DM) 100-10 MG/5ML syrup 5 mL  5 mL Oral Q4H PRN Edwin Dada, MD   5 mL at 02/28/15 0850  . heparin injection 5,000 Units  5,000 Units Subcutaneous 3 times per day Edwin Dada,  MD   5,000 Units at 02/28/15 0508  . Influenza vac split quadrivalent PF (FLUARIX) injection 0.5 mL  0.5 mL Intramuscular Tomorrow-1000 Mutaz Elmahi, MD      . insulin aspart (novoLOG) injection 0-9 Units  0-9 Units Subcutaneous TID WC Edwin Dada, MD   5 Units at 02/28/15 [phone]  . [START ON 03/01/2015] insulin glargine (LANTUS) injection 20 Units  20 Units Subcutaneous q morning - 10a Mutaz Elmahi, MD      . insulin glargine (LANTUS) injection 5 Units  5 Units Subcutaneous Once Verlee Monte, MD      . levETIRAcetam (KEPPRA) tablet 500 mg  500 mg Oral BID Edwin Dada, MD   500 mg at 02/28/15 0851  . ondansetron (ZOFRAN) tablet 4 mg  4 mg Oral Q6H PRN Edwin Dada, MD       Or  . ondansetron (ZOFRAN) injection 4 mg  4 mg Intravenous Q6H PRN Edwin Dada, MD        Allergies as of 02/27/2015  . (No Known Allergies)    Family History  Problem Relation Age of Onset  . Hypertension Mother   . Hypertension Father     Social History   Social History  . Marital Status: Single    Spouse Name: N/A  . Number of Children: N/A  . Years of Education: N/A   Occupational History  . Not on file.   Social History Main Topics  . Smoking status: Never Smoker   . Smokeless tobacco: Never Used  . Alcohol Use: No  . Drug Use: No  . Sexual Activity: No   Other Topics Concern  . Not on file   Social History Narrative    Review of Systems: Gen: Denies any fever, chills, sweats, anorexia, fatigue, weakness, malaise, weight loss, and sleep disorder HEENT: Retinopathy CV: Denies chest pain, angina, palpitations, syncope, orthopnea, PND, peripheral edema, and claudication. Resp: Denies dyspnea at rest, dyspnea with exercise, cough, sputum, wheezing, coughing up blood, and pleurisy. GI: Denies vomiting blood, jaundice, and fecal incontinence.   Denies dysphagia or odynophagia. GU : Denies urinary burning, blood in urine, urinary frequency, urinary hesitancy, nocturnal urination, and urinary incontinence.  No renal calculi. MS: Denies joint pain, limitation of movement, and swelling, stiffness, low back pain, extremity pain. Denies muscle weakness, cramps, atrophy.  No use of non steroidal antiinflammatory drugs. Derm: Denies rash, itching, dry skin, hives, moles, warts, or unhealing ulcers.  Psych: Denies depression, anxiety, memory loss, suicidal ideation, hallucinations, paranoia, and confusion. Heme: Denies bruising, bleeding, and enlarged lymph nodes. Neuro: No headache.  No diplopia. No dysarthria.  No dysphasia.  No history of  CVA.  No Seizures. No paresthesias.  No weakness. Endocrine No DM.  No Thyroid disease.  No Adrenal disease.  Physical Exam: Vital signs in last 24 hours: Temp:  [98.1 F (36.7 C)-99.1 F (37.3 C)] 98.1 F (36.7 C) (12/24 0530) Pulse Rate:  [83-94] 83 (12/24 0530) Resp:  [16-19] 19 (12/24 0530) BP: (153-165)/(84-93) 162/90 mmHg (12/24 0530) SpO2:  [96 %-98 %] 96 % (12/23 2027) Last BM Date: 02/26/15 General:   Alert,  Well-developed, well-nourished, pleasant and cooperative in NAD Head:  Normocephalic and atraumatic. Eyes:  Sclera clear, no icterus.   Conjunctiva pink. Ears:  Normal auditory acuity. Nose:  No deformity, discharge,  or lesions. Mouth:  No deformity or lesions, dentition normal. Neck:  Supple; no masses or thyromegaly. JVP not elevated Lungs:  Clear throughout to auscultation.  No wheezes, crackles, or rhonchi. No acute distress. Heart:  Regular rate and rhythm; no murmurs, clicks, rubs,  or gallops. Abdomen:  Soft, nontender and nondistended. No masses, hepatosplenomegaly or hernias noted. Normal bowel sounds, without guarding, and without rebound.   Msk:  Symmetrical without gross deformities. Normal posture. Pulses:  No carotid, renal, femoral bruits. DP and PT symmetrical and equal Extremities:  Without clubbing or edema. Neurologic:  Alert and  oriented x4;  grossly normal neurologically. Skin:  Intact without significant lesions or rashes. Cervical Nodes:  No significant cervical adenopathy. Psych:  Alert and cooperative. Normal mood and affect.  Intake/Output from previous day: 12/23 0701 - 12/24 0700 In: 1220 [P.O.:1220] Out: 1320 [Urine:1320] Intake/Output this shift:    Lab Results:  Recent Labs  02/27/15 0148 02/28/15 0544  WBC 8.3 3.3*  HGB 10.0* 7.4*  HCT 30.0* 22.3*  PLT 275 227   BMET  Recent Labs  02/27/15 0148 02/27/15 0839 02/28/15 0544  NA 138  --  137  K 5.5*  --  4.8  CL 106  --  106  CO2 21*  --  20*  GLUCOSE 372* 489*  301*  BUN 55*  --  63*  CREATININE 7.22*  --  7.94*  CALCIUM 6.9*  --  6.6*  PHOS  --   --  4.8*   LFT  Recent Labs  02/27/15 0148 02/28/15 0544  PROT 6.6  --   ALBUMIN 2.8* 2.4*  AST 22  --   ALT 16*  --   ALKPHOS 95  --   BILITOT 0.6  --    PT/INR No results for input(s): LABPROT, INR in the last 72 hours. Hepatitis Panel No results for input(s): HEPBSAG, HCVAB, HEPAIGM, HEPBIGM in the last 72 hours.  Studies/Results: Dg Chest 2 View  02/27/2015  CLINICAL DATA:  Cough for several days.  Hypertension. EXAM: CHEST  2 VIEW COMPARISON:  October 26, 2013 FINDINGS: Lungs are clear. Heart size and pulmonary vascularity are normal. No adenopathy. No bone lesions. IMPRESSION: No edema or consolidation. Electronically Signed   By: Lowella Grip III M.D.   On: 02/27/2015 08:22    Assessment/Plan:  Chronic renal disease with baseline creatinine 5  Now with acute on chronic changes             Secondary to diabetes            Monitor is and Os             Serial creatinines            Avoid nephrotoxins            Will need vascular Access             Check vein mapping  Acute kidney Injury   This may be just progression of chronic renal disease            Serial creatinine            Is and Os            Will continue to follow   HTN avoid ACE or ARBs at this time             Continue to follow  Anemia               Iron  And TIBC              Evaluate for ESA  Bones  Check PTH  LOS: 1 Cela Newcom W @TODAY @11 :07 AM

## 2015-03-01 ENCOUNTER — Inpatient Hospital Stay (HOSPITAL_COMMUNITY): Payer: Medicare Other

## 2015-03-01 DIAGNOSIS — N179 Acute kidney failure, unspecified: Secondary | ICD-10-CM

## 2015-03-01 DIAGNOSIS — N189 Chronic kidney disease, unspecified: Secondary | ICD-10-CM

## 2015-03-01 LAB — RENAL FUNCTION PANEL
ANION GAP: 9 (ref 5–15)
Albumin: 2.3 g/dL — ABNORMAL LOW (ref 3.5–5.0)
BUN: 62 mg/dL — AB (ref 6–20)
CHLORIDE: 108 mmol/L (ref 101–111)
CO2: 21 mmol/L — ABNORMAL LOW (ref 22–32)
Calcium: 6.9 mg/dL — ABNORMAL LOW (ref 8.9–10.3)
Creatinine, Ser: 8.4 mg/dL — ABNORMAL HIGH (ref 0.61–1.24)
GFR calc Af Amer: 9 mL/min — ABNORMAL LOW (ref >60)
GFR, EST NON AFRICAN AMERICAN: 7 mL/min — AB (ref >60)
Glucose, Bld: 283 mg/dL — ABNORMAL HIGH (ref 65–99)
PHOSPHORUS: 5.1 mg/dL — AB (ref 2.5–4.6)
POTASSIUM: 4.9 mmol/L (ref 3.5–5.1)
Sodium: 138 mmol/L (ref 135–145)

## 2015-03-01 LAB — GLUCOSE, CAPILLARY
GLUCOSE-CAPILLARY: 208 mg/dL — AB (ref 65–99)
GLUCOSE-CAPILLARY: 125 mg/dL — AB (ref 65–99)
Glucose-Capillary: 151 mg/dL — ABNORMAL HIGH (ref 65–99)
Glucose-Capillary: 131 mg/dL — ABNORMAL HIGH (ref 65–99)

## 2015-03-01 MED ORDER — INSULIN GLARGINE 100 UNIT/ML ~~LOC~~ SOLN
25.0000 [IU] | Freq: Every morning | SUBCUTANEOUS | Status: DC
  Administered 2015-03-02 – 2015-03-07 (×6): 25 [IU] via SUBCUTANEOUS
  Filled 2015-03-01 (×6): qty 0.25

## 2015-03-01 MED ORDER — LABETALOL HCL 200 MG PO TABS
200.0000 mg | ORAL_TABLET | Freq: Two times a day (BID) | ORAL | Status: DC
  Administered 2015-03-01 – 2015-03-07 (×12): 200 mg via ORAL
  Filled 2015-03-01 (×13): qty 1

## 2015-03-01 MED ORDER — HYDRALAZINE HCL 20 MG/ML IJ SOLN
10.0000 mg | Freq: Four times a day (QID) | INTRAMUSCULAR | Status: DC | PRN
  Administered 2015-03-01: 10 mg via INTRAVENOUS
  Filled 2015-03-01: qty 1

## 2015-03-01 MED ORDER — HYDRALAZINE HCL 50 MG PO TABS
50.0000 mg | ORAL_TABLET | Freq: Three times a day (TID) | ORAL | Status: DC
  Administered 2015-03-01 – 2015-03-07 (×16): 50 mg via ORAL
  Filled 2015-03-01 (×18): qty 1

## 2015-03-01 MED ORDER — DARBEPOETIN ALFA 200 MCG/0.4ML IJ SOSY
200.0000 ug | PREFILLED_SYRINGE | INTRAMUSCULAR | Status: DC
  Administered 2015-03-01: 200 ug via SUBCUTANEOUS
  Filled 2015-03-01: qty 0.4

## 2015-03-01 MED ORDER — INSULIN GLARGINE 100 UNIT/ML ~~LOC~~ SOLN
5.0000 [IU] | Freq: Once | SUBCUTANEOUS | Status: AC
  Administered 2015-03-01: 5 [IU] via SUBCUTANEOUS
  Filled 2015-03-01: qty 0.05

## 2015-03-01 NOTE — Progress Notes (Signed)
[facility] ROUNDING NOTE   Subjective:   Interval History: no complaints this morning  Objective:  Vital signs in last 24 hours:  Temp:  [98.1 F (36.7 C)-98.2 F (36.8 C)] 98.1 F (36.7 C) (12/25 0531) Pulse Rate:  [78-86] 78 (12/25 0531) Resp:  [19] 19 (12/25 0531) BP: (164-176)/(91-95) 173/91 mmHg (12/25 0844) SpO2:  [96 %-98 %] 98 % (12/25 0531)  Weight change:  Filed Weights   02/27/15 0706  Weight: 94.5 kg (208 lb 5.4 oz)    Intake/Output: I/O last 3 completed shifts: In: 3201.7 [P.O.:1280; I.V.:1921.7] Out: 1700 [Urine:1700]   Intake/Output this shift:  Total I/O In: 240 [P.O.:240] Out: 900 [Urine:900]  CVS- RRR RS- CTA ABD- BS present soft non-distended EXT- no edema   Basic Metabolic Panel:  Recent Labs Lab 02/27/15 0148 02/27/15 0839 02/28/15 0544 03/01/15 0524  NA 138  --  137 138  K 5.5*  --  4.8 4.9  CL 106  --  106 108  CO2 21*  --  20* 21*  GLUCOSE 372* 489* 301* 283*  BUN 55*  --  63* 62*  CREATININE 7.22*  --  7.94* 8.40*  CALCIUM 6.9*  --  6.6* 6.9*  PHOS  --   --  4.8* 5.1*    Liver Function Tests:  Recent Labs Lab 02/27/15 0148 02/28/15 0544 03/01/15 0524  AST 22  --   --   ALT 16*  --   --   ALKPHOS 95  --   --   BILITOT 0.6  --   --   PROT 6.6  --   --   ALBUMIN 2.8* 2.4* 2.3*   No results for input(s): LIPASE, AMYLASE in the last 168 hours. No results for input(s): AMMONIA in the last 168 hours.  CBC:  Recent Labs Lab 02/27/15 0148 02/28/15 0544  WBC 8.3 3.3*  HGB 10.0* 7.4*  HCT 30.0* 22.3*  MCV 82.2 80.8  PLT 275 227    Cardiac Enzymes: No results for input(s): CKTOTAL, CKMB, CKMBINDEX, TROPONINI in the last 168 hours.  BNP: Invalid input(s): POCBNP  CBG:  Recent Labs Lab 02/28/15 1153 02/28/15 1618 02/28/15 2126 03/01/15 0728 03/01/15 1254  GLUCAP 305* 144* 204* 208* 151*    Microbiology: Results for orders placed or performed during the hospital encounter of 08/30/14   MRSA PCR Screening     Status: None   Collection Time: 08/30/14 11:18 PM  Result Value Ref Range Status   MRSA by PCR NEGATIVE NEGATIVE Final    Comment:        The GeneXpert MRSA Assay (FDA approved for NASAL specimens only), is one component of a comprehensive MRSA colonization surveillance program. It is not intended to diagnose MRSA infection nor to guide or monitor treatment for MRSA infections.     Coagulation Studies: No results for input(s): LABPROT, INR in the last 72 hours.  Urinalysis:  Recent Labs  02/27/15 0212  COLORURINE YELLOW  LABSPEC 1.012  PHURINE 6.5  GLUCOSEU 250*  HGBUR MODERATE*  BILIRUBINUR NEGATIVE  KETONESUR NEGATIVE  PROTEINUR >300*  NITRITE NEGATIVE  LEUKOCYTESUR NEGATIVE      Imaging: US Renal  02/28/2015  CLINICAL DATA:  Acute renal failure EXAM: RENAL / URINARY TRACT ULTRASOUND COMPLETE COMPARISON:  Report 01/08/2002 no images available FINDINGS: Right Kidney: Length: 12.7 cm in length. No hydronephrosis. Small amount of perinephric fluid in lower pole. There is diffuse mild increased echogenicity suspicious for medical renal disease. No renal calculi. Left Kidney:  Length: 11.3 cm. Diffuse increased echogenicity and mild cortical thinning suspicious for medical renal disease. Very mild left hydronephrosis. No renal calculi. Bladder: Thickening of urinary bladder wall up to 9 mm. Cystitis cannot be excluded. IMPRESSION: 1. Bilateral renal increased echogenicity probable due to medical renal disease. Small amount of perinephric fluid lower pole of the right kidney. Very mild left hydronephrosis. 2. There is thickening of urinary bladder wall up to 9 mm suspicious for cystitis. Electronically Signed   By: Lahoma Crocker M.D.   On: 02/28/2015 17:15     Medications:   . sodium chloride 100 mL/hr at 02/28/15 2128   . amLODipine  10 mg Oral Daily  . calcitRIOL  0.25 mcg Oral Daily  . heparin  5,000 Units Subcutaneous 3 times per day  .  hydrALAZINE  50 mg Oral 3 times per day  . Influenza vac split quadrivalent PF  0.5 mL Intramuscular Tomorrow-1000  . insulin aspart  0-9 Units Subcutaneous TID WC  . [START ON 03/02/2015] insulin glargine  25 Units Subcutaneous q morning - 10a  . labetalol  200 mg Oral BID  . levETIRAcetam  500 mg Oral BID   acetaminophen **OR** acetaminophen, guaiFENesin-dextromethorphan, hydrALAZINE, ondansetron **OR** ondansetron (ZOFRAN) IV  Assessment/ Plan:   Chronic renal disease with baseline creatinine 5  Now with acute on chronic changes  Secondary to diabetes  Monitor is and Os   Serial creatinines  Avoid nephrotoxins  Will need vascular Access --- patient has not followed with Dr Moshe Cipro in a  while  Check vein mapping --  Will need VVS to evaluate in house or urgent outpatient  Acute kidney Injury This may be just progression of chronic renal disease  Serial creatinine  Is and Os  Will continue to follow  -- may settle will follow  HTN avoid ACE or ARBs at this time  Continue to follow               Change carvedilol to labetalol 200mg  BID  Anemia  Iron And TIBC   Iron sats 20 %                 Start aranesp 266mcg weekly    Bones  Check PTH  pending    LOS: 2 Ohanna Gassert W @TODAY @2 :18 PM

## 2015-03-01 NOTE — Progress Notes (Signed)
[facility] PROGRESS NOTE   Edward Colon [id number] DOB: January 03, 1981 DOA: 02/27/2015 PCP: [name], MD  HPI/Subjective: Documented 900 mL urine output over last night, no evidence of fluid overload. Feels okay.  Assessment/Plan: Principal Problem:   Acute on chronic renal failure (HCC) Active Problems:   Seizures (HCC)   Hypertension   Hyperkalemia   Hypoglycemia   Anemia   URI (upper respiratory infection)   Acute-on-chronic kidney injury ([place])    Acute on chronic renal failure:  This is new. Patient is on transplant list at [facility]. Chronic renal failure is from T1DM.  Now with several days emesis and sore throat, worsening renal failure.  Hopefully this is prerenal from poor PO intake and will improve with fluids.  Also considered that this is progression of disease.  UA without findings, has mildly stenosis and no evidence of fluid overload on exam, has mild hyperkalemia. Nephrology consulted, continue IV fluids and monitor intake and output. Needs better control for blood pressure, creatinine up to 8.4.  Hyperkalemia:  This is new.  This is resolved after fluid hydration.  Hypoglycemia in IDDM:  Diabetes mellitus with complications including CKD and insensate neuropathy Even 15 units of Lantus yesterday, fasting blood sugar is 301, increase Lantus to 20 units. Sliding scale corrections only  HTN:  Continue with amlodipine and carvedilol, last continued by Cardiology in Nov. Patient thinks he takes hydralazine, which they had asked him to hold -Continue amlodipine and carvedilol for now Needs better control for blood pressure  Anemia of renal failure:  Hemoglobin was 10.0 on 12/23, hemoglobin dropped to 7.4 this morning. This is likely secondary to hemodilution, continue to follow hemoglobin, if drop further he might needs transfusion.  History of seizures:  Stable.  Continue levetiracetam  Cough: Suspect this is a URI,  with malaise, sore throat, congestion, cough.  Chest x-ray without findings.   Code Status: Full Code Family Communication: Plan discussed with the patient. Disposition Plan: Remains inpatient Diet: Diet renal/carb modified with fluid restriction Diet-HS Snack?: Nothing; Room service appropriate?: Yes; Fluid consistency:: Thin  Consultants:  None  Procedures:  None*  Antibiotics:  None   Objective: Filed Vitals:   03/01/15 0531 03/01/15 0844  BP: 176/94 173/91  Pulse: 78   Temp: 98.1 F (36.7 C)   Resp: 19     Intake/Output Summary (Last 24 hours) at 03/01/15 0910 Last data filed at 03/01/15 0600  Gross per 24 hour  Intake 2521.67 ml  Output    900 ml  Net 1621.67 ml   Filed Weights   02/27/15 0706  Weight: 94.5 kg (208 lb 5.4 oz)    Exam: General: Alert and awake, oriented x3, not in any acute distress. HEENT: anicteric sclera, pupils reactive to light and accommodation, EOMI CVS: S1-S2 clear, no murmur rubs or gallops Chest: clear to auscultation bilaterally, no wheezing, rales or rhonchi Abdomen: soft nontender, nondistended, normal bowel sounds, no organomegaly Extremities: no cyanosis, clubbing or edema noted bilaterally Neuro: Cranial nerves II-XII intact, no focal neurological deficits  Data Reviewed: Basic Metabolic Panel:  Recent Labs Lab 02/27/15 0148 02/27/15 0839 02/28/15 0544 03/01/15 0524  NA 138  --  137 138  K 5.5*  --  4.8 4.9  CL 106  --  106 108  CO2 21*  --  20* 21*  GLUCOSE 372* 489* 301* 283*  BUN 55*  --  63* 62*  CREATININE 7.22*  --  7.94* 8.40*  CALCIUM 6.9*  --  6.6* 6.9*  PHOS  --   --  4.8* 5.1*   Liver Function Tests:  Recent Labs Lab 02/27/15 0148 02/28/15 0544 03/01/15 0524  AST 22  --   --   ALT 16*  --   --   ALKPHOS 95  --   --   BILITOT 0.6  --   --   PROT 6.6  --   --   ALBUMIN 2.8* 2.4* 2.3*   No results for input(s): LIPASE, AMYLASE in the last 168 hours. No results for input(s): AMMONIA  in the last 168 hours. CBC:  Recent Labs Lab 02/27/15 0148 02/28/15 0544  WBC 8.3 3.3*  HGB 10.0* 7.4*  HCT 30.0* 22.3*  MCV 82.2 80.8  PLT 275 227   Cardiac Enzymes: No results for input(s): CKTOTAL, CKMB, CKMBINDEX, TROPONINI in the last 168 hours. BNP (last 3 results) No results for input(s): BNP in the last 8760 hours.  ProBNP (last 3 results) No results for input(s): PROBNP in the last 8760 hours.  CBG:  Recent Labs Lab 02/28/15 0805 02/28/15 1153 02/28/15 1618 02/28/15 2126 03/01/15 0728  GLUCAP 277* 305* 144* 204* 208*    Micro No results found for this or any previous visit (from the past 240 hour(s)).   Studies: US Renal  02/28/2015  CLINICAL DATA:  Acute renal failure EXAM: RENAL / URINARY TRACT ULTRASOUND COMPLETE COMPARISON:  Report 01/08/2002 no images available FINDINGS: Right Kidney: Length: 12.7 cm in length. No hydronephrosis. Small amount of perinephric fluid in lower pole. There is diffuse mild increased echogenicity suspicious for medical renal disease. No renal calculi. Left Kidney: Length: 11.3 cm. Diffuse increased echogenicity and mild cortical thinning suspicious for medical renal disease. Very mild left hydronephrosis. No renal calculi. Bladder: Thickening of urinary bladder wall up to 9 mm. Cystitis cannot be excluded. IMPRESSION: 1. Bilateral renal increased echogenicity probable due to medical renal disease. Small amount of perinephric fluid lower pole of the right kidney. Very mild left hydronephrosis. 2. There is thickening of urinary bladder wall up to 9 mm suspicious for cystitis. Electronically Signed   By: Lahoma Crocker M.D.   On: 02/28/2015 17:15    Scheduled Meds: . amLODipine  10 mg Oral Daily  . calcitRIOL  0.25 mcg Oral Daily  . carvedilol  25 mg Oral BID  . heparin  5,000 Units Subcutaneous 3 times per day  . Influenza vac split quadrivalent PF  0.5 mL Intramuscular Tomorrow-1000  . insulin aspart  0-9 Units Subcutaneous TID WC  .  insulin glargine  20 Units Subcutaneous q morning - 10a  . levETIRAcetam  500 mg Oral BID   Continuous Infusions: . sodium chloride 100 mL/hr at 02/28/15 2128       Time spent: 35 minutes    [facility] Pager [phone] If 7PM-7AM, please contact night-coverage at [email or web address], password [facility] 03/01/2015, 9:10 AM  LOS: 2 days

## 2015-03-01 NOTE — Progress Notes (Signed)
VASCULAR LAB PRELIMINARY  PRELIMINARY  PRELIMINARY  PRELIMINARY  Right  Upper Extremity Vein Map    Cephalic  Segment Diameter Comment  1. Axilla 2.4mm   2. Mid upper arm 1.27mm   3. Above AC 2.93mm   5. Below AC 4.71mm   6. Mid forearm 3.62mm   7. Wrist 3.57mm branch  2.7 Basilic  Segment Diameter Depth Comment  3. Above [facility] 3.15mm 15.15mm Enters brachial  5. Below AC 3mm 8.35mm Branch  6. Mid forearm 1.36mm 4.31mm   7. Wrist 1.43mm 1.73mm       Left Upper Extremity Vein Map    Cephalic  Segment Diameter Comment  2. Mid upper arm 1.35mm   3. Above AC 1.2mm   5. Below AC 2.61mm   6. Mid forearm 2.4mm   7. Wrist 2.15mm   1.Prox  Upper arm 1.40mm    mm    mm    Basilic  Segment Diameter Depth Comment  2. Mid upper arm 2.16mm 13.53mm   3. Above AC 4.12mm 32mm   5. Below AC 2.66mm 4.86mm   6. Mid forearm 3.50mm 5.81mm   7. Wrist 2.61mm 4.30mm    Edward Colon, RVS  03/01/2015, 7:47 PM

## 2015-03-02 LAB — RENAL FUNCTION PANEL
ALBUMIN: 2.3 g/dL — AB (ref 3.5–5.0)
ANION GAP: 11 (ref 5–15)
BUN: 58 mg/dL — AB (ref 6–20)
CO2: 20 mmol/L — ABNORMAL LOW (ref 22–32)
Calcium: 7.1 mg/dL — ABNORMAL LOW (ref 8.9–10.3)
Chloride: 110 mmol/L (ref 101–111)
Creatinine, Ser: 8.07 mg/dL — ABNORMAL HIGH (ref 0.61–1.24)
GFR calc Af Amer: 9 mL/min — ABNORMAL LOW (ref >60)
GFR, EST NON AFRICAN AMERICAN: 8 mL/min — AB (ref >60)
Glucose, Bld: 180 mg/dL — ABNORMAL HIGH (ref 65–99)
PHOSPHORUS: 5.7 mg/dL — AB (ref 2.5–4.6)
POTASSIUM: 4.5 mmol/L (ref 3.5–5.1)
Sodium: 141 mmol/L (ref 135–145)

## 2015-03-02 LAB — CBC
HEMATOCRIT: 23.3 % — AB (ref 39.0–52.0)
HEMOGLOBIN: 7.8 g/dL — AB (ref 13.0–17.0)
MCH: 27 pg (ref 26.0–34.0)
MCHC: 33.5 g/dL (ref 30.0–36.0)
MCV: 80.6 fL (ref 78.0–100.0)
Platelets: 240 10*3/uL (ref 150–400)
RBC: 2.89 MIL/uL — ABNORMAL LOW (ref 4.22–5.81)
RDW: 14.3 % (ref 11.5–15.5)
WBC: 3.4 10*3/uL — AB (ref 4.0–10.5)

## 2015-03-02 LAB — GLUCOSE, CAPILLARY
GLUCOSE-CAPILLARY: 143 mg/dL — AB (ref 65–99)
GLUCOSE-CAPILLARY: 297 mg/dL — AB (ref 65–99)
GLUCOSE-CAPILLARY: 252 mg/dL — AB (ref 65–99)
Glucose-Capillary: 192 mg/dL — ABNORMAL HIGH (ref 65–99)

## 2015-03-02 NOTE — Progress Notes (Signed)
  [facility] Progress Note   Subjective: tired  Filed Vitals:   03/01/15 1730 03/01/15 1756 03/01/15 2109 03/02/15 0551  BP: 157/90  162/82 169/94  Pulse:  84 81 81  Temp:   98.2 F (36.8 C) 97.7 F (36.5 C)  TempSrc:   Oral Oral  Resp:   18 16  Height:      Weight:      SpO2:   97% 99%    Inpatient medications: . amLODipine  10 mg Oral Daily  . calcitRIOL  0.25 mcg Oral Daily  . darbepoetin (ARANESP) injection - NON-DIALYSIS  200 mcg Subcutaneous Q Sun-1800  . heparin  5,000 Units Subcutaneous 3 times per day  . hydrALAZINE  50 mg Oral 3 times per day  . Influenza vac split quadrivalent PF  0.5 mL Intramuscular Tomorrow-1000  . insulin aspart  0-9 Units Subcutaneous TID WC  . insulin glargine  25 Units Subcutaneous q morning - 10a  . labetalol  200 mg Oral BID  . levETIRAcetam  500 mg Oral BID   . sodium chloride 100 mL/hr at 03/02/15 0550   acetaminophen **OR** acetaminophen, guaiFENesin-dextromethorphan, hydrALAZINE, ondansetron **OR** ondansetron (ZOFRAN) IV  Exam: AAM no distress No jvd Chest clear bilat RRR  Abd soft ntnd no ascites Ext no edema Neuro no asterixis      Assessment: 1 CKD w likely progression to ESRD - creat 4 in June, now 7-8 w early uremic symptoms. Plan initiation of dialysis, have d/w VVS they will plan for tunneled cath and perm access most likely tomorrow. Will have pt transferred to [facility] 2 HTN on 3 bp meds 3 DM1 4 Hypoglycemic episode  Plan - as above. HD tomorrow at [facility] after access placement.    Kelly Splinter MD [facility] pager [phone]    cell [phone] 03/02/2015, 11:59 AM    Recent Labs Lab 02/28/15 0544 03/01/15 0524 03/02/15 0503  NA 137 138 141  K 4.8 4.9 4.5  CL 106 108 110  CO2 20* 21* 20*  GLUCOSE 301* 283* 180*  BUN 63* 62* 58*  CREATININE 7.94* 8.40* 8.07*  CALCIUM 6.6* 6.9* 7.1*  PHOS 4.8* 5.1* 5.7*    Recent Labs Lab 02/27/15 0148 02/28/15 0544 03/01/15 0524  03/02/15 0503  AST 22  --   --   --   ALT 16*  --   --   --   ALKPHOS 95  --   --   --   BILITOT 0.6  --   --   --   PROT 6.6  --   --   --   ALBUMIN 2.8* 2.4* 2.3* 2.3*    Recent Labs Lab 02/27/15 0148 02/28/15 0544 03/02/15 0503  WBC 8.3 3.3* 3.4*  HGB 10.0* 7.4* 7.8*  HCT 30.0* 22.3* 23.3*  MCV 82.2 80.8 80.6  PLT 275 227 240

## 2015-03-02 NOTE — Progress Notes (Signed)
Transfer note:  Arrival Method: Biomedical scientist via [employer]  Mental Orientation:A&OX4 Telemetry: N/A Assessment: See flowsheet Skin: Dry; intact IV: L arm Pain: Denies Tubes: N/A Safety Measures:  Fall Prevention Safety Plan: Bed in lowest position, call  light and belongings within reach, nonskid socks placed.   6700 Orientation: Patient has been oriented to the unit, staff and to the room.  Orders have been reviewed and implement. Will continue to assess and monitor pt.  Edward Colon

## 2015-03-02 NOTE — Progress Notes (Addendum)
Patient ID: Edward Colon, male   DOB: 02/12/1981, 34 y.o.   MRN: [id number] Discussed with Dr Melvia Heaps.  Will need HD cath and AV fistula tomorrow.  To be transferred to [place] today.  Vein map shows good R arm cephalic vein.   Will make NPO past midnight.Discussed with patient by telephone.  Dr Trula Slade will be doing procedure and discuss further tomorrow

## 2015-03-02 NOTE — Progress Notes (Signed)
[facility] PROGRESS NOTE   GAYLOR RAWLINGS [id number] DOB: April 17, 1980 DOA: 02/27/2015 PCP: [name], MD  HPI/Subjective: No complaints overnight, again 900 mL all urine documented overnight reviewed Till now has over 3.6 L positive input since admission, no lower extremity edema denies any shortness of breath/orthopnea.  Assessment/Plan: Principal Problem:   Acute on chronic renal failure (HCC) Active Problems:   Seizures (HCC)   Hypertension   Hyperkalemia   Hypoglycemia   Anemia   URI (upper respiratory infection)   Acute-on-chronic kidney injury ([place])    Acute on chronic renal failure:  This is new. Patient is on transplant list at [facility]. Chronic renal failure is from T1DM.  Now with several days emesis and sore throat, worsening renal failure.  Hopefully this is prerenal from poor PO intake and will improve with fluids.  Also considered that this is progression of disease.  UA without findings, has mildly stenosis and no evidence of fluid overload on exam, has mild hyperkalemia. Nephrology consulted, continue IV fluids and monitor intake and output. Needs better control for blood pressure, creatinine is 8.0, hopefully it's plateaued before it's improving. Per nephrology needs access either prior to discharge or urgently as outpatient.  HTN:  Continue with amlodipine and carvedilol, carvedilol switched to labetalol by nephrology. I added 50 mg of hydralazine, high will increase to 103 times a day. Needs better control for blood pressure  Hyperkalemia:  This is new.  This is resolved after fluid hydration.  Hypoglycemia in IDDM:  Diabetes mellitus with complications including CKD and insensate neuropathy Even 15 units of Lantus yesterday, fasting blood sugar is 301, increase Lantus to 20 units. Sliding scale corrections only  Anemia of renal failure:  Hemoglobin was 10.0 on 12/23, hemoglobin dropped to 7.4 this morning. This is  likely secondary to hemodilution, continue to follow hemoglobin, if drop further he might needs transfusion.  History of seizures:  Stable.  Continue levetiracetam  Cough: Suspect this is a URI, with malaise, sore throat, congestion, cough.  Chest x-ray without findings.   Code Status: Full Code Family Communication: Plan discussed with the patient. Disposition Plan: Remains inpatient Diet: Diet renal/carb modified with fluid restriction Diet-HS Snack?: Nothing; Room service appropriate?: Yes; Fluid consistency:: Thin  Consultants:  None  Procedures:  None*  Antibiotics:  None   Objective: Filed Vitals:   03/01/15 2109 03/02/15 0551  BP: 162/82 169/94  Pulse: 81 81  Temp: 98.2 F (36.8 C) 97.7 F (36.5 C)  Resp: 18 16    Intake/Output Summary (Last 24 hours) at 03/02/15 1153 Last data filed at 03/02/15 1003  Gross per 24 hour  Intake   2980 ml  Output    900 ml  Net   2080 ml   Filed Weights   02/27/15 0706  Weight: 94.5 kg (208 lb 5.4 oz)    Exam: General: Alert and awake, oriented x3, not in any acute distress. HEENT: anicteric sclera, pupils reactive to light and accommodation, EOMI CVS: S1-S2 clear, no murmur rubs or gallops Chest: clear to auscultation bilaterally, no wheezing, rales or rhonchi Abdomen: soft nontender, nondistended, normal bowel sounds, no organomegaly Extremities: no cyanosis, clubbing or edema noted bilaterally Neuro: Cranial nerves II-XII intact, no focal neurological deficits  Data Reviewed: Basic Metabolic Panel:  Recent Labs Lab 02/27/15 0148 02/27/15 0839 02/28/15 0544 03/01/15 0524 03/02/15 0503  NA 138  --  137 138 141  K 5.5*  --  4.8 4.9 4.5  CL 106  --  106  108 110  CO2 21*  --  20* 21* 20*  GLUCOSE 372* 489* 301* 283* 180*  BUN 55*  --  63* 62* 58*  CREATININE 7.22*  --  7.94* 8.40* 8.07*  CALCIUM 6.9*  --  6.6* 6.9* 7.1*  PHOS  --   --  4.8* 5.1* 5.7*   Liver Function Tests:  Recent Labs Lab  02/27/15 0148 02/28/15 0544 03/01/15 0524 03/02/15 0503  AST 22  --   --   --   ALT 16*  --   --   --   ALKPHOS 95  --   --   --   BILITOT 0.6  --   --   --   PROT 6.6  --   --   --   ALBUMIN 2.8* 2.4* 2.3* 2.3*   No results for input(s): LIPASE, AMYLASE in the last 168 hours. No results for input(s): AMMONIA in the last 168 hours. CBC:  Recent Labs Lab 02/27/15 0148 02/28/15 0544 03/02/15 0503  WBC 8.3 3.3* 3.4*  HGB 10.0* 7.4* 7.8*  HCT 30.0* 22.3* 23.3*  MCV 82.2 80.8 80.6  PLT 275 227 240   Cardiac Enzymes: No results for input(s): CKTOTAL, CKMB, CKMBINDEX, TROPONINI in the last 168 hours. BNP (last 3 results) No results for input(s): BNP in the last 8760 hours.  ProBNP (last 3 results) No results for input(s): PROBNP in the last 8760 hours.  CBG:  Recent Labs Lab 03/01/15 1254 03/01/15 1705 03/01/15 2104 03/02/15 0803 03/02/15 1149  GLUCAP 151* 131* 125* 143* 297*    Micro No results found for this or any previous visit (from the past 240 hour(s)).   Studies: US Renal  02/28/2015  CLINICAL DATA:  Acute renal failure EXAM: RENAL / URINARY TRACT ULTRASOUND COMPLETE COMPARISON:  Report 01/08/2002 no images available FINDINGS: Right Kidney: Length: 12.7 cm in length. No hydronephrosis. Small amount of perinephric fluid in lower pole. There is diffuse mild increased echogenicity suspicious for medical renal disease. No renal calculi. Left Kidney: Length: 11.3 cm. Diffuse increased echogenicity and mild cortical thinning suspicious for medical renal disease. Very mild left hydronephrosis. No renal calculi. Bladder: Thickening of urinary bladder wall up to 9 mm. Cystitis cannot be excluded. IMPRESSION: 1. Bilateral renal increased echogenicity probable due to medical renal disease. Small amount of perinephric fluid lower pole of the right kidney. Very mild left hydronephrosis. 2. There is thickening of urinary bladder wall up to 9 mm suspicious for cystitis.  Electronically Signed   By: Lahoma Crocker M.D.   On: 02/28/2015 17:15    Scheduled Meds: . amLODipine  10 mg Oral Daily  . calcitRIOL  0.25 mcg Oral Daily  . darbepoetin (ARANESP) injection - NON-DIALYSIS  200 mcg Subcutaneous Q Sun-1800  . heparin  5,000 Units Subcutaneous 3 times per day  . hydrALAZINE  50 mg Oral 3 times per day  . Influenza vac split quadrivalent PF  0.5 mL Intramuscular Tomorrow-1000  . insulin aspart  0-9 Units Subcutaneous TID WC  . insulin glargine  25 Units Subcutaneous q morning - 10a  . labetalol  200 mg Oral BID  . levETIRAcetam  500 mg Oral BID   Continuous Infusions: . sodium chloride 100 mL/hr at 03/02/15 0550       Time spent: 35 minutes    [facility]  [facility] Pager [phone] If 7PM-7AM, please contact night-coverage at [email or web address], password [facility] 03/02/2015, 11:53 AM  LOS: 3 days

## 2015-03-02 NOTE — Progress Notes (Signed)
Report called to [place] at [employer]. Patient will be transported via [place]. Patient is aware of transfer.

## 2015-03-03 ENCOUNTER — Encounter (HOSPITAL_COMMUNITY): Payer: Self-pay | Admitting: Anesthesiology

## 2015-03-03 ENCOUNTER — Other Ambulatory Visit: Payer: Self-pay | Admitting: *Deleted

## 2015-03-03 ENCOUNTER — Inpatient Hospital Stay (HOSPITAL_COMMUNITY): Payer: Medicare Other

## 2015-03-03 ENCOUNTER — Inpatient Hospital Stay (HOSPITAL_COMMUNITY): Payer: Medicare Other | Admitting: Anesthesiology

## 2015-03-03 ENCOUNTER — Encounter (HOSPITAL_COMMUNITY)
Admission: EM | Disposition: A | Discharge status: Home / Self Care | Payer: Self-pay | Source: Home / Self Care | Attending: Internal Medicine

## 2015-03-03 DIAGNOSIS — N185 Chronic kidney disease, stage 5: Secondary | ICD-10-CM

## 2015-03-03 DIAGNOSIS — N186 End stage renal disease: Secondary | ICD-10-CM

## 2015-03-03 DIAGNOSIS — Z4931 Encounter for adequacy testing for hemodialysis: Secondary | ICD-10-CM

## 2015-03-03 HISTORY — PX: AV FISTULA PLACEMENT: SHX1204

## 2015-03-03 HISTORY — PX: INSERTION OF DIALYSIS CATHETER: SHX1324

## 2015-03-03 LAB — GLUCOSE, CAPILLARY
GLUCOSE-CAPILLARY: 122 mg/dL — AB (ref 65–99)
GLUCOSE-CAPILLARY: 212 mg/dL — AB (ref 65–99)
GLUCOSE-CAPILLARY: 351 mg/dL — AB (ref 65–99)
Glucose-Capillary: 139 mg/dL — ABNORMAL HIGH (ref 65–99)
Glucose-Capillary: 129 mg/dL — ABNORMAL HIGH (ref 65–99)

## 2015-03-03 LAB — RENAL FUNCTION PANEL
ALBUMIN: 2.2 g/dL — AB (ref 3.5–5.0)
Anion gap: 10 (ref 5–15)
BUN: 55 mg/dL — AB (ref 6–20)
CALCIUM: 7.1 mg/dL — AB (ref 8.9–10.3)
CO2: 17 mmol/L — ABNORMAL LOW (ref 22–32)
CREATININE: 8.2 mg/dL — AB (ref 0.61–1.24)
Chloride: 114 mmol/L — ABNORMAL HIGH (ref 101–111)
GFR calc Af Amer: 9 mL/min — ABNORMAL LOW (ref >60)
GFR, EST NON AFRICAN AMERICAN: 8 mL/min — AB (ref >60)
GLUCOSE: 157 mg/dL — AB (ref 65–99)
PHOSPHORUS: 5.9 mg/dL — AB (ref 2.5–4.6)
Potassium: 4.6 mmol/L (ref 3.5–5.1)
SODIUM: 141 mmol/L (ref 135–145)

## 2015-03-03 LAB — CBC
HCT: 24.4 % — ABNORMAL LOW (ref 39.0–52.0)
Hemoglobin: 8.2 g/dL — ABNORMAL LOW (ref 13.0–17.0)
MCH: 27.3 pg (ref 26.0–34.0)
MCHC: 33.6 g/dL (ref 30.0–36.0)
MCV: 81.3 fL (ref 78.0–100.0)
PLATELETS: 256 10*3/uL (ref 150–400)
RBC: 3 MIL/uL — ABNORMAL LOW (ref 4.22–5.81)
RDW: 14.5 % (ref 11.5–15.5)
WBC: 3.4 10*3/uL — ABNORMAL LOW (ref 4.0–10.5)

## 2015-03-03 LAB — SURGICAL PCR SCREEN
MRSA, PCR: NEGATIVE
Staphylococcus aureus: POSITIVE — AB

## 2015-03-03 LAB — PROTIME-INR
INR: 0.96 (ref 0.00–1.49)
Prothrombin Time: 13 seconds (ref 11.6–15.2)

## 2015-03-03 SURGERY — INSERTION OF DIALYSIS CATHETER
Anesthesia: General | Site: Chest | Laterality: Right

## 2015-03-03 MED ORDER — PROTAMINE SULFATE 10 MG/ML IV SOLN
INTRAVENOUS | Status: DC | PRN
  Administered 2015-03-03: 25 mg via INTRAVENOUS

## 2015-03-03 MED ORDER — PHENYLEPHRINE HCL 10 MG/ML IJ SOLN
10.0000 mg | INTRAVENOUS | Status: DC | PRN
  Administered 2015-03-03: 20 ug/min via INTRAVENOUS

## 2015-03-03 MED ORDER — INSULIN ASPART 100 UNIT/ML ~~LOC~~ SOLN
4.0000 [IU] | Freq: Once | SUBCUTANEOUS | Status: AC
  Administered 2015-03-03: 4 [IU] via SUBCUTANEOUS

## 2015-03-03 MED ORDER — PROMETHAZINE HCL 25 MG/ML IJ SOLN: 6.2500 mg | INTRAMUSCULAR | Status: DC | PRN

## 2015-03-03 MED ORDER — LIDOCAINE HCL (CARDIAC) 20 MG/ML IV SOLN
INTRAVENOUS | Status: DC | PRN
  Administered 2015-03-03: 100 mg via INTRAVENOUS

## 2015-03-03 MED ORDER — NA FERRIC GLUC CPLX IN SUCROSE 12.5 MG/ML IV SOLN
250.0000 mg | INTRAVENOUS | Status: DC
  Filled 2015-03-03 (×4): qty 20

## 2015-03-03 MED ORDER — SODIUM CHLORIDE 0.9 % IV SOLN: INTRAVENOUS | Status: DC

## 2015-03-03 MED ORDER — HEPARIN SODIUM (PORCINE) 5000 UNIT/ML IJ SOLN
INTRAMUSCULAR | Status: DC | PRN
  Administered 2015-03-03: 500 mL

## 2015-03-03 MED ORDER — HEPARIN SODIUM (PORCINE) 5000 UNIT/ML IJ SOLN
5000.0000 [IU] | Freq: Three times a day (TID) | INTRAMUSCULAR | Status: DC
  Administered 2015-03-03 – 2015-03-06 (×9): 5000 [IU] via SUBCUTANEOUS
  Filled 2015-03-03 (×8): qty 1

## 2015-03-03 MED ORDER — OXYCODONE HCL 5 MG PO TABS: 5.0000 mg | ORAL_TABLET | Freq: Four times a day (QID) | ORAL | Status: DC | PRN

## 2015-03-03 MED ORDER — PHENYLEPHRINE HCL 10 MG/ML IJ SOLN
INTRAMUSCULAR | Status: DC | PRN
  Administered 2015-03-03 (×2): 80 ug via INTRAVENOUS

## 2015-03-03 MED ORDER — LIDOCAINE-EPINEPHRINE (PF) 1 %-1:200000 IJ SOLN
INTRAMUSCULAR | Status: AC
  Filled 2015-03-03: qty 30

## 2015-03-03 MED ORDER — PHENYLEPHRINE 40 MCG/ML (10ML) SYRINGE FOR IV PUSH (FOR BLOOD PRESSURE SUPPORT)
PREFILLED_SYRINGE | INTRAVENOUS | Status: AC
  Filled 2015-03-03: qty 10

## 2015-03-03 MED ORDER — MIDAZOLAM HCL 2 MG/2ML IJ SOLN
INTRAMUSCULAR | Status: AC
  Filled 2015-03-03: qty 2

## 2015-03-03 MED ORDER — FENTANYL CITRATE (PF) 100 MCG/2ML IJ SOLN
INTRAMUSCULAR | Status: DC | PRN
  Administered 2015-03-03: 150 ug via INTRAVENOUS

## 2015-03-03 MED ORDER — MIDAZOLAM HCL 5 MG/5ML IJ SOLN
INTRAMUSCULAR | Status: DC | PRN
  Administered 2015-03-03: 2 mg via INTRAVENOUS

## 2015-03-03 MED ORDER — PROPOFOL 10 MG/ML IV BOLUS
INTRAVENOUS | Status: DC | PRN
  Administered 2015-03-03: 200 mg via INTRAVENOUS

## 2015-03-03 MED ORDER — FENTANYL CITRATE (PF) 250 MCG/5ML IJ SOLN
INTRAMUSCULAR | Status: AC
  Filled 2015-03-03: qty 5

## 2015-03-03 MED ORDER — HEPARIN SODIUM (PORCINE) 1000 UNIT/ML IJ SOLN
INTRAMUSCULAR | Status: AC
  Filled 2015-03-03: qty 1

## 2015-03-03 MED ORDER — LIDOCAINE-EPINEPHRINE (PF) 1 %-1:200000 IJ SOLN
INTRAMUSCULAR | Status: DC | PRN
  Administered 2015-03-03: 8 mL

## 2015-03-03 MED ORDER — DEXTROSE 5 % IV SOLN
1.5000 g | INTRAVENOUS | Status: AC
  Administered 2015-03-03: 1.5 g via INTRAVENOUS
  Filled 2015-03-03 (×2): qty 1.5

## 2015-03-03 MED ORDER — HYDROMORPHONE HCL 1 MG/ML IJ SOLN: 0.2500 mg | INTRAMUSCULAR | Status: DC | PRN

## 2015-03-03 MED ORDER — HEPARIN SODIUM (PORCINE) 1000 UNIT/ML IJ SOLN
INTRAMUSCULAR | Status: DC | PRN
  Administered 2015-03-03: 3.2 mL via INTRAVENOUS

## 2015-03-03 MED ORDER — 0.9 % SODIUM CHLORIDE (POUR BTL) OPTIME
TOPICAL | Status: DC | PRN
  Administered 2015-03-03: 1000 mL

## 2015-03-03 MED ORDER — DEXTROSE 5 % IV SOLN: 1.5000 g | INTRAVENOUS | Status: DC

## 2015-03-03 MED ORDER — HEPARIN SODIUM (PORCINE) 1000 UNIT/ML IJ SOLN
INTRAMUSCULAR | Status: DC | PRN
  Administered 2015-03-03: 3000 [IU] via INTRAVENOUS

## 2015-03-03 SURGICAL SUPPLY — 62 items
ARMBAND PINK RESTRICT EXTREMIT (MISCELLANEOUS) ×4 IMPLANT
BAG DECANTER FOR FLEXI CONT (MISCELLANEOUS) ×4 IMPLANT
BIOPATCH RED 1 DISK 7.0 (GAUZE/BANDAGES/DRESSINGS) ×3 IMPLANT
BIOPATCH RED 1IN DISK 7.0MM (GAUZE/BANDAGES/DRESSINGS) ×1
CANISTER SUCTION 2500CC (MISCELLANEOUS) ×4 IMPLANT
CATH CANNON HEMO 15F 50CM (CATHETERS) IMPLANT
CATH CANNON HEMO 15FR 19 (HEMODIALYSIS SUPPLIES) IMPLANT
CATH CANNON HEMO 15FR 23CM (HEMODIALYSIS SUPPLIES) IMPLANT
CATH CANNON HEMO 15FR 31CM (HEMODIALYSIS SUPPLIES) IMPLANT
CATH CANNON HEMO 15FR 32 (HEMODIALYSIS SUPPLIES) IMPLANT
CATH CANNON HEMO 15FR 32CM (HEMODIALYSIS SUPPLIES) IMPLANT
CATH EMB 3FR 40CM (CATHETERS) ×2 IMPLANT
CATH PALINDROME RT-P 15FX23CM (CATHETERS) ×2 IMPLANT
CLIP TI MEDIUM 6 (CLIP) ×6 IMPLANT
CLIP TI WIDE RED SMALL 6 (CLIP) ×8 IMPLANT
COVER PROBE W GEL 5X96 (DRAPES) ×6 IMPLANT
DRAPE C-ARM 42X72 X-RAY (DRAPES) ×4 IMPLANT
DRAPE CHEST BREAST 15X10 FENES (DRAPES) ×4 IMPLANT
ELECT REM PT RETURN 9FT ADLT (ELECTROSURGICAL) ×4
ELECTRODE REM PT RTRN 9FT ADLT (ELECTROSURGICAL) ×2 IMPLANT
GAUZE SPONGE 2X2 8PLY STRL LF (GAUZE/BANDAGES/DRESSINGS) IMPLANT
GAUZE SPONGE 4X4 16PLY XRAY LF (GAUZE/BANDAGES/DRESSINGS) ×4 IMPLANT
GLOVE BIO SURGEON STRL SZ 6.5 (GLOVE) ×2 IMPLANT
GLOVE BIO SURGEONS STRL SZ 6.5 (GLOVE) ×2
GLOVE BIOGEL PI IND STRL 6.5 (GLOVE) IMPLANT
GLOVE BIOGEL PI IND STRL 7.5 (GLOVE) ×2 IMPLANT
GLOVE BIOGEL PI INDICATOR 6.5 (GLOVE) ×4
GLOVE BIOGEL PI INDICATOR 7.5 (GLOVE) ×4
GLOVE SURG SS PI 7.0 STRL IVOR (GLOVE) ×4 IMPLANT
GLOVE SURG SS PI 7.5 STRL IVOR (GLOVE) ×6 IMPLANT
GOWN STRL REUS W/ TWL LRG LVL3 (GOWN DISPOSABLE) ×4 IMPLANT
GOWN STRL REUS W/ TWL XL LVL3 (GOWN DISPOSABLE) ×2 IMPLANT
GOWN STRL REUS W/TWL LRG LVL3 (GOWN DISPOSABLE) ×8
GOWN STRL REUS W/TWL XL LVL3 (GOWN DISPOSABLE) ×16
HEMOSTAT SNOW SURGICEL 2X4 (HEMOSTASIS) IMPLANT
KIT BASIN OR (CUSTOM PROCEDURE TRAY) ×4 IMPLANT
KIT ROOM TURNOVER OR (KITS) ×4 IMPLANT
LIQUID BAND (GAUZE/BANDAGES/DRESSINGS) ×6 IMPLANT
NDL 18GX1X1/2 (RX/OR ONLY) (NEEDLE) ×2 IMPLANT
NDL HYPO 25GX1X1/2 BEV (NEEDLE) ×2 IMPLANT
NEEDLE 18GX1X1/2 (RX/OR ONLY) (NEEDLE) ×4 IMPLANT
NEEDLE HYPO 25GX1X1/2 BEV (NEEDLE) ×4 IMPLANT
NS IRRIG 1000ML POUR BTL (IV SOLUTION) ×4 IMPLANT
PACK CV ACCESS (CUSTOM PROCEDURE TRAY) ×4 IMPLANT
PACK SURGICAL SETUP 50X90 (CUSTOM PROCEDURE TRAY) ×2 IMPLANT
PAD ARMBOARD 7.5X6 YLW CONV (MISCELLANEOUS) ×8 IMPLANT
SOAP 2 % CHG 4 OZ (WOUND CARE) ×4 IMPLANT
SPONGE GAUZE 2X2 STER 10/PKG (GAUZE/BANDAGES/DRESSINGS) ×2
SUT ETHILON 3 0 PS 1 (SUTURE) ×4 IMPLANT
SUT PROLENE 6 0 CC (SUTURE) ×6 IMPLANT
SUT VIC AB 3-0 SH 27 (SUTURE) ×4
SUT VIC AB 3-0 SH 27X BRD (SUTURE) ×2 IMPLANT
SUT VICRYL 4-0 PS2 18IN ABS (SUTURE) ×4 IMPLANT
SYR 20CC LL (SYRINGE) ×6 IMPLANT
SYR 5ML LL (SYRINGE) ×4 IMPLANT
SYR CONTROL 10ML LL (SYRINGE) ×2 IMPLANT
SYRINGE 10CC LL (SYRINGE) ×4 IMPLANT
SYRINGE 1CC SLIP TB (MISCELLANEOUS) ×2 IMPLANT
SYRINGE 20CC LL (MISCELLANEOUS) ×1 IMPLANT
TAPE CLOTH SURG 4X10 WHT LF (GAUZE/BANDAGES/DRESSINGS) ×2 IMPLANT
UNDERPAD 30X30 INCONTINENT (UNDERPADS AND DIAPERS) ×4 IMPLANT
WATER STERILE IRR 1000ML POUR (IV SOLUTION) ×4 IMPLANT

## 2015-03-03 NOTE — Anesthesia Preprocedure Evaluation (Addendum)
Anesthesia Evaluation  Patient identified by MRN, date of birth, ID band Patient awake    Reviewed: Allergy & Precautions, NPO status , Patient's Chart, lab work & pertinent test results  History of Anesthesia Complications Negative for: history of anesthetic complications  Airway Mallampati: II  TM Distance: >3 FB Neck ROM: Full    Dental  (+) Teeth Intact, Dental Advisory Given   Pulmonary neg pulmonary ROS,    Pulmonary exam normal        Cardiovascular hypertension, Normal cardiovascular exam     Neuro/Psych Seizures -,  negative psych ROS   GI/Hepatic negative GI ROS, Neg liver ROS,   Endo/Other  diabetes  Renal/GU CRFRenal disease     Musculoskeletal   Abdominal   Peds  Hematology   Anesthesia Other Findings   Reproductive/Obstetrics                            Anesthesia Physical Anesthesia Plan  ASA: III  Anesthesia Plan: General   Post-op Pain Management:    Induction: Intravenous  Airway Management Planned: LMA  Additional Equipment:   Intra-op Plan:   Post-operative Plan: Extubation in OR  Informed Consent: I have reviewed the patients History and Physical, chart, labs and discussed the procedure including the risks, benefits and alternatives for the proposed anesthesia with the patient or authorized representative who has indicated his/her understanding and acceptance.   Dental advisory given  Plan Discussed with: CRNA, Anesthesiologist and Surgeon  Anesthesia Plan Comments:        Anesthesia Quick Evaluation

## 2015-03-03 NOTE — Anesthesia Postprocedure Evaluation (Signed)
Anesthesia Post Note  Patient: Edward Colon  Procedure(s) Performed: Procedure(s) (LRB): INSERTION OF DIALYSIS CATHETER (Right) RIGHT RADIO-CEPHALIC ARTERIOVENOUS (AV) FISTULA CREATION (Right)  Patient location during evaluation: PACU Anesthesia Type: General Level of consciousness: sedated Pain management: pain level controlled Vital Signs Assessment: post-procedure vital signs reviewed and stable Respiratory status: spontaneous breathing and respiratory function stable Cardiovascular status: stable Anesthetic complications: no    Last Vitals:  Filed Vitals:   03/03/15 1830 03/03/15 2044  BP: 124/79 158/82  Pulse: 75 86  Temp:  36.6 C  Resp: 13 14    Last Pain:  Filed Vitals:   03/03/15 2045  PainSc: [place]

## 2015-03-03 NOTE — Anesthesia Procedure Notes (Signed)
Procedure Name: LMA Insertion Date/Time: 03/03/2015 2:49 PM Performed by: Izora Gala Pre-anesthesia Checklist: Patient identified, Emergency Drugs available, Suction available, Patient being monitored and Timeout performed Patient Re-evaluated:Patient Re-evaluated prior to inductionOxygen Delivery Method: Circle system utilized Preoxygenation: Pre-oxygenation with 100% oxygen Intubation Type: IV induction Ventilation: Mask ventilation without difficulty LMA: LMA inserted LMA Size: 5.0 Number of attempts: 1 Placement Confirmation: positive ETCO2 and breath sounds checked- equal and bilateral Tube secured with: Tape Dental Injury: Teeth and Oropharynx as per pre-operative assessment

## 2015-03-03 NOTE — Progress Notes (Signed)
03/03/2015 8:48 PM  AM lantus missed d/t procedure. CBG 351. MD made aware. Awaiting orders.    Mick Sell RN

## 2015-03-03 NOTE — Transfer of Care (Signed)
Immediate Anesthesia Transfer of Care Note  Patient: Edward Colon  Procedure(s) Performed: Procedure(s): INSERTION OF DIALYSIS CATHETER (Right) RIGHT RADIO-CEPHALIC ARTERIOVENOUS (AV) FISTULA CREATION (Right)  Patient Location: PACU  Anesthesia Type:General  Level of Consciousness: sedated and patient cooperative  Airway & Oxygen Therapy: Patient Spontanous Breathing and Patient connected to nasal cannula oxygen  Post-op Assessment: Report given to RN, Post -op Vital signs reviewed and stable and Patient moving all extremities  Post vital signs: Reviewed and stable  Last Vitals:  Filed Vitals:   03/03/15 0500 03/03/15 0829  BP: 156/89 170/91  Pulse: 82 84  Temp: 36.8 C 36.6 C  Resp: 16 18    Complications: No apparent anesthesia complications

## 2015-03-03 NOTE — Consult Note (Signed)
Hospital Consult    Reason for Consult:  In need of [facility] and permanent access Referring Physician:  Melvia Heaps MRN #:  [id number]  History of Present Illness: This is a 34 y.o. male who presented to the hospital with several days of cough, sore throat, congestion and tiredness.  He was also vomiting and voiding more.  He did have a hypoglycemic episode and passed out.  EMS was call and he was given glucagon and glucose and brought to the ER.  He states that this has been happening more frequently lately.  In the ED, he was found to have acute on chronic renal failure with a creatinine of 7.    [facility] is consulted for [facility] and permanent access.  He is on insulin for his diabetes.  He does take a beta blocker and CCB for blood pressure control.  He is on Keppra for seizure disorder.  He is blind in his left eye and has had surgery on that eye in the past.  He has never smoked and will only drink on a very rare occasion.    Past Medical History  Diagnosis Date  . Diabetes mellitus without complication ([place])   . Hypertension   . Seizures ([place])   . Chronic kidney disease   . Type 1 diabetes (HCC)     PSH: Left eye surgery  No Known Allergies  Prior to Admission medications   Medication Sig Start Date End Date Taking? Authorizing Provider  amLODipine (NORVASC) 10 MG tablet Take 1 tablet (10 mg total) by mouth daily. 11/07/14  Yes Skeet Latch, MD  calcitRIOL (ROCALTROL) 0.25 MCG capsule Take 0.25 mcg by mouth daily. 10/10/14  Yes Historical Provider, MD  carvedilol (COREG) 25 MG tablet Take 25 mg by mouth 2 (two) times daily. 10/07/14  Yes Historical Provider, MD  furosemide (LASIX) 40 MG tablet Take 40 mg by mouth 2 (two) times daily.    Yes Historical Provider, MD  insulin glargine (LANTUS) 100 UNIT/ML injection Inject 0.15 mLs (15 Units total) into the skin every morning. Patient taking differently: Inject 20 Units into the skin every morning.  09/02/14  Yes Silver Huguenin Elgergawy, MD  insulin  lispro (HUMALOG) 100 UNIT/ML injection Inject 2-10 Units into the skin 3 (three) times daily as needed for high blood sugar (high blood sugar).    Yes Historical Provider, MD  levETIRAcetam (KEPPRA) 500 MG tablet Take 1 tablet (500 mg total) by mouth 2 (two) times daily. 06/04/13  Yes Barton Dubois, MD    Social History   Social History  . Marital Status: Single    Spouse Name: N/A  . Number of Children: N/A  . Years of Education: N/A   Occupational History  . Not on file.   Social History Main Topics  . Smoking status: Never Smoker   . Smokeless tobacco: Never Used  . Alcohol Use: No  . Drug Use: No  . Sexual Activity: No   Other Topics Concern  . Not on file   Social History Narrative     Family History  Problem Relation Age of Onset  . Hypertension Mother   . Hypertension Father   no hx of aneurysms   ROS: [x]  Positive   [ ]  Negative   [ ]  All sytems reviewed and are negative  Cardiovascular: [x]  chest pressure with cough []  palpitations []  SOB lying flat []  DOE []  pain in legs while walking []  pain in legs at rest []  pain in legs at night []   non-healing ulcers []  hx of DVT []  swelling in legs  Pulmonary: []  productive cough []  asthma/wheezing []  home O2 [x]  recent cough  Neurologic: []  weakness in []  arms []  legs []  numbness in []  arms []  legs []  hx of CVA []  mini stroke [] difficulty speaking or slurred speech [x]  permanent blindness left eye []  dizziness [x]  hx seizures  Hematologic: []  hx of cancer []  bleeding problems []  problems with blood clotting easily  Endocrine:   [x]  diabetes on insulin []  thyroid disease  GI []  vomiting blood []  blood in stool  GU: [x]  CKD/renal failure []  HD--[]  M/W/F or []  T/T/S []  burning with urination []  blood in urine  Psychiatric: []  anxiety []  depression  Musculoskeletal: []  arthritis []  joint pain  Integumentary: []  rashes []  ulcers  Constitutional: []  fever []  chills   Physical  Examination  Filed Vitals:   03/02/15 2136 03/03/15 0500  BP: 164/86 156/89  Pulse: 90 82  Temp: 98.4 F (36.9 C) 98.2 F (36.8 C)  Resp: 18 16   Body mass index is 29.07 kg/(m^2).  General:  WDWN in NAD Gait: Not observed HENT: WNL, normocephalic Pulmonary: normal non-labored breathing, without Rales, rhonchi,  wheezing Cardiac: regular, without  Murmurs, rubs or gallops; without carotid bruits Abdomen:  soft, NT/ND, no masses Skin: without rashes Vascular Exam/Pulses:  Right Left  Radial 2+ (normal) 2+ (normal)  Ulnar 1+ (weak) 1+ (weak)  Popliteal Unable to palpate  Unable to palpate   DP 3+ (hyperdynamic) 3+ (hyperdynamic)  PT Unable to palpate  Unable to palpate    Extremities: without ischemic changes, without Gangrene , without cellulitis; without open wounds;  Musculoskeletal: no muscle wasting or atrophy  Neurologic: A&O X 3; Appropriate Affect ; SENSATION: normal; MOTOR FUNCTION:  moving all extremities equally. Speech is fluent/normal Psychiatric:  Normal affect and appears capable of medical decision making   CBC    Component Value Date/Time   WBC 3.4* 03/02/2015 0503   RBC 2.89* 03/02/2015 0503   RBC 3.74* 08/30/2014 2340   HGB 7.8* 03/02/2015 0503   HCT 23.3* 03/02/2015 0503   PLT 240 03/02/2015 0503   MCV 80.6 03/02/2015 0503   MCH 27.0 03/02/2015 0503   MCHC 33.5 03/02/2015 0503   RDW 14.3 03/02/2015 0503   LYMPHSABS 1.5 08/30/2013 0601   MONOABS 0.4 08/30/2013 0601   EOSABS 0.1 08/30/2013 0601   BASOSABS 0.0 08/30/2013 0601    BMET    Component Value Date/Time   NA 141 03/03/2015 0550   K 4.6 03/03/2015 0550   CL 114* 03/03/2015 0550   CO2 17* 03/03/2015 0550   GLUCOSE 157* 03/03/2015 0550   BUN 55* 03/03/2015 0550   CREATININE 8.20* 03/03/2015 0550   CALCIUM 7.1* 03/03/2015 0550   GFRNONAA 8* 03/03/2015 0550   GFRAA 9* 03/03/2015 0550    COAGS: No results found for: INR, PROTIME   Non-Invasive Vascular Imaging:   Bilateral  upper extremity vein mapping 0000000:  RIGHT Cephalic  Segment Diameter Comment  1. Axilla 2.5mm   2. Mid upper arm 1.26mm   3. Above AC 2.40mm   5. Below AC 4.64mm   6. Mid forearm 3.38mm   7. Wrist 3.2mm branch  2.7 Basilic  Segment Diameter Depth Comment  3. Above [facility] 3.68mm 15.64mm Enters brachial  5. Below AC 33mm 8.16mm Branch  6. Mid forearm 1.28mm 4.44mm   7. Wrist 1.84mm 1.77mm        LEFT:  Cephalic  Segment Diameter Comment  2.  Mid upper arm 1.48mm   3. Above AC 1.72mm   5. Below AC 2.23mm   6. Mid forearm 2.21mm   7. Wrist 2.46mm   1.Prox Upper arm 1.61mm    mm    mm    Basilic  Segment Diameter Depth Comment  2. Mid upper arm 2.23mm 13.16mm   3. Above AC 4.70mm 56mm   5. Below AC 2.45mm 4.71mm   6. Mid forearm 3.39mm 5.44mm   7. Wrist 2.44mm 4.80mm          Statin:  No. Beta Blocker:  Yes.   Aspirin:  No. ACEI:  No. ARB:  No. Other antiplatelets/anticoagulants:  Yes.   SQ heparin   ASSESSMENT/PLAN: This is a 34 y.o. male in need of TDC and permanent HD access and is right hand dominant   -he is npo and CBC, INR and IV Abx on call to OR have been ordered. -despite being right hand dominant, the veins look the best in the right arm, which will give him a better chance at obtaining a working fistula. -will get consent for right arm fistula and dialysis cathter placement. -continue npo for surgery later today by Dr. Trula Slade -I have adjusted his heparin dose to 2200 tonight for his next dose due to surgery.   Leontine Locket, PA-C [facility] [phone]     I agree with the above.  I have seen and evaluated the patient.  Briefly this is a 34 year old gentleman with worsening renal failure, now requiring dialysis.  I have reviewed his vein mapping.  In addition I used the bedside ultrasound to evaluate the cephalic and basilic vein in the left  arm as well as the cephalic vein in the right arm.  I think his best option is a right radiocephalic fistula in addition to his catheter.  We discussed the risk of steal syndrome as well as the risk of non-maturity.  He understands this and wishes to proceed.  Annamarie Major

## 2015-03-03 NOTE — Progress Notes (Signed)
03/03/2015  9:19 PM    One time dose of 4 units Novolog SQ now and give missed dose Lantus 25 unit SQ now per Chaney Malling NP.    Mick Sell RN

## 2015-03-03 NOTE — Care Management Important Message (Signed)
Important Message  Patient Details  Name: Edward Colon MRN: [id number] Date of Birth: [birth year]   Medicare Important Message Given:  Yes    Nathen May 03/03/2015, 12:37 PM

## 2015-03-03 NOTE — Discharge Instructions (Signed)
° ° °  03/03/2015 DAID TRESCH [id number] March 31, 1980  Surgeon(s): Serafina Mitchell, MD  Procedure(s): INSERTION OF DIALYSIS CATHETER RIGHT RADIO-CEPHALIC ARTERIOVENOUS (AV) FISTULA CREATION  x Do not stick fistula for 12 weeks

## 2015-03-03 NOTE — Progress Notes (Signed)
[facility] PROGRESS NOTE   Edward Colon [id number] DOB: 06/07/1980 DOA: 02/27/2015 PCP: [name], MD  HPI/Subjective: Sleeping in PACU.  Assessment/Plan: Principal Problem:   Acute on chronic renal failure (HCC) Active Problems:   Seizures (HCC)   Hypertension   Hyperkalemia   Hypoglycemia   Anemia   URI (upper respiratory infection)   Acute-on-chronic kidney injury ([place])  Interim summary: Edward Colon is a 34 y.o. male with a past medical history significant for epilepsy, T1DM c/b CKD stage IV, renovascular HTN, and anemia of renal disease presented to [facility] for severeal days of cough, malaise, polyuria. And a hypoglycemic episode.   Acute on chronic renal failure:  Patient is on [facility] list [facility].   Nephrology consulted,  Per nephrology needs access either prior to discharge or urgently as outpatient. Pt transferred to [facility] for access placement.   HTN:  Better controlled.   Hyperkalemia:   This is resolved after fluid hydration.  Hypoglycemia in IDDM:  Diabetes mellitus with complications including CKD and insensate neuropathy CBG (last 3)   Recent Labs  03/03/15 1358 03/03/15 1720 03/03/15 2042  GLUCAP 129* 212* 351*      Anemia of renal failure:  Hemoglobin was 10.0 on 12/23, hemoglobin dropped to 8.2 . Transfuse to keep hemoglobin greater than 7.  This is likely secondary to hemodilution, continue to follow hemoglobin, if drop further he might needs transfusion.  History of seizures:  Stable.  Continue levetiracetam  Cough: Suspect this is a URI, with malaise, sore throat, congestion, cough.  Chest x-ray without findings.   Code Status: Full Code Family Communication: none at bedside.  Disposition Plan: pending further investigation.  Consultants:  Vascular  Renal.   Procedures: Right arm fistula HD catheter placement.   Antibiotics:  None   Objective: Filed Vitals:   03/03/15 1815  03/03/15 1830  BP: 127/78 124/79  Pulse: 75 75  Temp:    Resp: 13 13    Intake/Output Summary (Last 24 hours) at 03/03/15 2013 Last data filed at 03/03/15 1914  Gross per 24 hour  Intake    740 ml  Output    200 ml  Net    540 ml   Filed Weights   02/27/15 0706  Weight: 94.5 kg (208 lb 5.4 oz)    Exam: General: pt sleeping .  CVS: S1-S2 clear, no murmur rubs or gallops Chest: clear to auscultation bilaterally, no wheezing, rales or rhonchi Abdomen: soft nontender, nondistended, normal bowel sounds, no organomegaly Extremities: no cyanosis, clubbing or edema noted bilaterally  Data Reviewed: Basic Metabolic Panel:  Recent Labs Lab 02/27/15 0148 02/27/15 0839 02/28/15 0544 03/01/15 0524 03/02/15 0503 03/03/15 0550  NA 138  --  137 138 141 141  K 5.5*  --  4.8 4.9 4.5 4.6  CL 106  --  106 108 110 114*  CO2 21*  --  20* 21* 20* 17*  GLUCOSE 372* 489* 301* 283* 180* 157*  BUN 55*  --  63* 62* 58* 55*  CREATININE 7.22*  --  7.94* 8.40* 8.07* 8.20*  CALCIUM 6.9*  --  6.6* 6.9* 7.1* 7.1*  PHOS  --   --  4.8* 5.1* 5.7* 5.9*   Liver Function Tests:  Recent Labs Lab 02/27/15 0148 02/28/15 0544 03/01/15 0524 03/02/15 0503 03/03/15 0550  AST 22  --   --   --   --   ALT 16*  --   --   --   --  ALKPHOS 95  --   --   --   --   BILITOT 0.6  --   --   --   --   PROT 6.6  --   --   --   --   ALBUMIN 2.8* 2.4* 2.3* 2.3* 2.2*   No results for input(s): LIPASE, AMYLASE in the last 168 hours. No results for input(s): AMMONIA in the last 168 hours. CBC:  Recent Labs Lab 02/27/15 0148 02/28/15 0544 03/02/15 0503 03/03/15 1015  WBC 8.3 3.3* 3.4* 3.4*  HGB 10.0* 7.4* 7.8* 8.2*  HCT 30.0* 22.3* 23.3* 24.4*  MCV 82.2 80.8 80.6 81.3  PLT 275 227 240 256   Cardiac Enzymes: No results for input(s): CKTOTAL, CKMB, CKMBINDEX, TROPONINI in the last 168 hours. BNP (last 3 results) No results for input(s): BNP in the last 8760 hours.  ProBNP (last 3 results) No  results for input(s): PROBNP in the last 8760 hours.  CBG:  Recent Labs Lab 03/02/15 2159 03/03/15 0828 03/03/15 1055 03/03/15 1358 03/03/15 1720  GLUCAP 192* 139* 122* 129* 212*    Micro Recent Results (from the past 240 hour(s))  Surgical pcr screen     Status: Abnormal   Collection Time: 03/03/15  6:06 AM  Result Value Ref Range Status   MRSA, PCR NEGATIVE NEGATIVE Final   Staphylococcus aureus POSITIVE (A) NEGATIVE Final    Comment:        The Xpert SA Assay (FDA approved for NASAL specimens in patients over 37 years of age), is one component of a comprehensive surveillance program.  Test performance has been validated by [facility] for patients greater than or equal to 80 year old. It is not intended to diagnose infection nor to guide or monitor treatment.      Studies: Dg Chest Port 1 View  03/03/2015  CLINICAL DATA:  Status post dialysis catheter insertion EXAM: PORTABLE CHEST 1 VIEW COMPARISON:  02/27/2015 FINDINGS: There is a dual-lumen right-sided central venous catheter with the tip projecting over the cavoatrial junction. There is bilateral interstitial prominence and prominence of the central pulmonary vasculature. There are low lung volumes. There is no pleural effusion or pneumothorax. The heart and mediastinal contours are unremarkable. The osseous structures are unremarkable. IMPRESSION: Dual-lumen right-sided central venous catheter with the tip projecting over the cavoatrial junction. Electronically Signed   By: Kathreen Devoid   On: 03/03/2015 18:25   Dg Fluoro Guide Cv Line-no Report  03/03/2015  CLINICAL DATA:  FLOURO GUIDE CV LINE Fluoroscopy was utilized by the requesting physician.  No radiographic interpretation.    Scheduled Meds: . amLODipine  10 mg Oral Daily  . calcitRIOL  0.25 mcg Oral Daily  . darbepoetin (ARANESP) injection - NON-DIALYSIS  200 mcg Subcutaneous Q Sun-1800  . ferric gluconate (FERRLECIT/NULECIT) IV  250 mg Intravenous  Q48H  . heparin  5,000 Units Subcutaneous 3 times per day  . hydrALAZINE  50 mg Oral 3 times per day  . Influenza vac split quadrivalent PF  0.5 mL Intramuscular Tomorrow-1000  . insulin aspart  0-9 Units Subcutaneous TID WC  . insulin glargine  25 Units Subcutaneous q morning - 10a  . labetalol  200 mg Oral BID  . levETIRAcetam  500 mg Oral BID   Continuous Infusions: . sodium chloride         Time spent: 15 minutes    [place] [facility] Pager [phone] If 7PM-7AM, please contact night-coverage at [email or web address], password [facility] 03/03/2015, 8:13 PM  LOS: 4 days

## 2015-03-04 ENCOUNTER — Telehealth: Payer: Self-pay | Admitting: Surgery

## 2015-03-04 ENCOUNTER — Encounter (HOSPITAL_COMMUNITY): Payer: Self-pay | Admitting: Surgery

## 2015-03-04 DIAGNOSIS — N179 Acute kidney failure, unspecified: Secondary | ICD-10-CM | POA: Diagnosis not present

## 2015-03-04 DIAGNOSIS — D631 Anemia in chronic kidney disease: Secondary | ICD-10-CM

## 2015-03-04 LAB — RENAL FUNCTION PANEL
Albumin: 2.2 g/dL — ABNORMAL LOW (ref 3.5–5.0)
Anion gap: 9 (ref 5–15)
BUN: 52 mg/dL — ABNORMAL HIGH (ref 6–20)
CHLORIDE: 107 mmol/L (ref 101–111)
CO2: 20 mmol/L — AB (ref 22–32)
CREATININE: 7.35 mg/dL — AB (ref 0.61–1.24)
Calcium: 7.2 mg/dL — ABNORMAL LOW (ref 8.9–10.3)
GFR calc non Af Amer: 9 mL/min — ABNORMAL LOW (ref >60)
GFR, EST AFRICAN AMERICAN: 10 mL/min — AB (ref >60)
Glucose, Bld: 423 mg/dL — ABNORMAL HIGH (ref 65–99)
Phosphorus: 3.6 mg/dL (ref 2.5–4.6)
Potassium: 4.9 mmol/L (ref 3.5–5.1)
Sodium: 136 mmol/L (ref 135–145)

## 2015-03-04 LAB — IRON AND TIBC
Iron: 23 ug/dL — ABNORMAL LOW (ref 45–182)
Saturation Ratios: 12 % — ABNORMAL LOW (ref 17.9–39.5)
TIBC: 192 ug/dL — AB (ref 250–450)
UIBC: 169 ug/dL

## 2015-03-04 LAB — CBC
HCT: 24.4 % — ABNORMAL LOW (ref 39.0–52.0)
Hemoglobin: 7.9 g/dL — ABNORMAL LOW (ref 13.0–17.0)
MCH: 26.5 pg (ref 26.0–34.0)
MCHC: 32.4 g/dL (ref 30.0–36.0)
MCV: 81.9 fL (ref 78.0–100.0)
PLATELETS: 251 10*3/uL (ref 150–400)
RBC: 2.98 MIL/uL — ABNORMAL LOW (ref 4.22–5.81)
RDW: 14.6 % (ref 11.5–15.5)
WBC: 4.7 10*3/uL (ref 4.0–10.5)

## 2015-03-04 LAB — GLUCOSE, CAPILLARY
GLUCOSE-CAPILLARY: 464 mg/dL — AB (ref 65–99)
Glucose-Capillary: 210 mg/dL — ABNORMAL HIGH (ref 65–99)
Glucose-Capillary: 318 mg/dL — ABNORMAL HIGH (ref 65–99)
Glucose-Capillary: 90 mg/dL (ref 65–99)
Glucose-Capillary: 145 mg/dL — ABNORMAL HIGH (ref 65–99)

## 2015-03-04 LAB — FOLATE: FOLATE: 7.5 ng/mL (ref >5.9)

## 2015-03-04 LAB — VITAMIN B12: Vitamin B-12: 708 pg/mL (ref 180–914)

## 2015-03-04 LAB — RETICULOCYTES
RBC.: 2.98 MIL/uL — AB (ref 4.22–5.81)
RETIC CT PCT: 1.1 % (ref 0.4–3.1)
Retic Count, Absolute: 32.8 10*3/uL (ref 19.0–186.0)

## 2015-03-04 LAB — FERRITIN: FERRITIN: 117 ng/mL (ref 24–336)

## 2015-03-04 MED ORDER — SODIUM CHLORIDE 0.9 % IV SOLN
250.0000 mg | INTRAVENOUS | Status: DC
  Administered 2015-03-04 – 2015-03-06 (×3): 250 mg via INTRAVENOUS
  Filled 2015-03-04 (×4): qty 20

## 2015-03-04 MED ORDER — HEPARIN SODIUM (PORCINE) 1000 UNIT/ML DIALYSIS: 1000.0000 [IU] | INTRAMUSCULAR | Status: DC | PRN

## 2015-03-04 MED ORDER — PENTAFLUOROPROP-TETRAFLUOROETH EX AERO: 1.0000 "application " | INHALATION_SPRAY | CUTANEOUS | Status: DC | PRN

## 2015-03-04 MED ORDER — INSULIN ASPART 100 UNIT/ML ~~LOC~~ SOLN
2.0000 [IU] | Freq: Three times a day (TID) | SUBCUTANEOUS | Status: DC
  Administered 2015-03-04 – 2015-03-07 (×5): 2 [IU] via SUBCUTANEOUS

## 2015-03-04 MED ORDER — LIDOCAINE-PRILOCAINE 2.5-2.5 % EX CREA: 1.0000 "application " | TOPICAL_CREAM | CUTANEOUS | Status: DC | PRN

## 2015-03-04 MED ORDER — ALTEPLASE 2 MG IJ SOLR: 2.0000 mg | Freq: Once | INTRAMUSCULAR | Status: DC | PRN

## 2015-03-04 MED ORDER — SODIUM CHLORIDE 0.9 % IV SOLN: 100.0000 mL | INTRAVENOUS | Status: DC | PRN

## 2015-03-04 MED ORDER — LIDOCAINE HCL (PF) 1 % IJ SOLN: 5.0000 mL | INTRAMUSCULAR | Status: DC | PRN

## 2015-03-04 NOTE — Telephone Encounter (Addendum)
sched appt 04/06/15  Ph# disconnected, emergency contact's vm is full  Mailed appt letter to inform pt of appt  ----- Message from Mena Goes, RN sent at 03/03/2015  5:17 PM EST ----- Regarding: schedule   ----- Message -----    From: Gabriel Earing, PA-C    Sent: 03/03/2015   3:22 PM      To: [facility] Charge Pool  S/p right arm fistula 03/03/15.  Fu with Dr. Trula Slade in 4-6 weeks with duplex.  Thanks, Aldona Bar

## 2015-03-04 NOTE — Progress Notes (Signed)
Had first hemodialysis Tx. Ran 2.5 hours on a 2k 2.25ca bath. Removed 2L as ordered without problems.BFR 250, DFR 500. Ran using new R [place] HD cath.

## 2015-03-04 NOTE — Progress Notes (Addendum)
  Postoperative hemodialysis access     Date of Surgery:  03/03/15 Surgeon: Trula Slade  Subjective:  No complaints except dialysis catheter is kind of a nuisance   PHYSICAL EXAMINATION:  Filed Vitals:   03/04/15 0637 03/04/15 0814  BP: 187/92 176/90  Pulse: 85 91  Temp: 98.5 F (36.9 C) 98.9 F (37.2 C)  Resp: 18 18    Incision is c/d/i Sens ation/motor in digits are intact;  The fistula is not easily palpable, however, there is a good doppler signal within the fistula.   ASSESSMENT/PLAN:  Edward Colon is a 34 y.o. year old male who is s/p right radial cephalic AVF.  -graft/fistula is patent with good doppler signal.  It is not easily palpable at this time. -pt does not have evidence of steal sx-he knows that if he develops these symptoms to contact us as soon as possible -f/u with Dr. Trula Slade in 4-6 weeks with duplex to check maturation of AVF -will sign off-call as needed.   Edward Locket, PA-C [facility] [phone]  Agree with the above.  I think I can feel his fistula.  No signs of steal.  Plan follow up in 6 weeks with duplex.  Edward Colon

## 2015-03-04 NOTE — Progress Notes (Signed)
Inpatient Diabetes Program Recommendations  AACE/ADA: New Consensus Statement on Inpatient Glycemic Control (2015)  Target Ranges:  Prepandial:   less than 140 mg/dL      Peak postprandial:   less than 180 mg/dL (1-2 hours)      Critically ill patients:  140 - 180 mg/dL   Review of Glycemic Control  Inpatient Diabetes Program Recommendations:  Insulin - Meal Coverage: add Novolog with meals 5 units TID Thank you  Raoul Pitch BSN, RN,CDE Inpatient Diabetes Coordinator [phone] (team pager)

## 2015-03-04 NOTE — Progress Notes (Signed)
  [facility] Progress Note   Subjective: stable, had first HD this am  Filed Vitals:   03/04/15 0600 03/04/15 0620 03/04/15 0637 03/04/15 0814  BP: 152/90 177/93 187/92 176/90  Pulse: 86 85 85 91  Temp: 98.1 F (36.7 C)  98.5 F (36.9 C) 98.9 F (37.2 C)  TempSrc: Oral  Oral Oral  Resp: 18 20 18 18   Height:      Weight: 91.1 kg (200 lb 13.4 oz)     SpO2: 100%  98% 97%    Inpatient medications: . amLODipine  10 mg Oral Daily  . calcitRIOL  0.25 mcg Oral Daily  . darbepoetin (ARANESP) injection - NON-DIALYSIS  200 mcg Subcutaneous Q Sun-1800  . ferric gluconate (FERRLECIT/NULECIT) IV  250 mg Intravenous Q48H  . heparin  5,000 Units Subcutaneous 3 times per day  . hydrALAZINE  50 mg Oral 3 times per day  . insulin aspart  0-9 Units Subcutaneous TID WC  . insulin glargine  25 Units Subcutaneous q morning - 10a  . labetalol  200 mg Oral BID  . levETIRAcetam  500 mg Oral BID   . sodium chloride     sodium chloride, sodium chloride, acetaminophen **OR** acetaminophen, alteplase, guaiFENesin-dextromethorphan, heparin, hydrALAZINE, lidocaine (PF), lidocaine-prilocaine, ondansetron **OR** ondansetron (ZOFRAN) IV, oxyCODONE, pentafluoroprop-tetrafluoroeth  Exam: AAM no distress No jvd Chest clear bilat RRR  Abd soft ntnd no ascites Ext no edema Neuro no asterixis      Assessment: 1 CKD 5, new ESRD - had 1st HD this am. SP IJ cath/ R arm RC AVF 2 HTN on 3 bp meds 3 DM1  4 Hypoglycemic episode  Plan - HD tomorrow. CLIP process started. Hopefully can have him placed and dc'd by Fri/ Sat.    Kelly Splinter MD [facility] pager [phone]    cell [phone] 03/04/2015, 4:15 PM    Recent Labs Lab 03/02/15 0503 03/03/15 0550 03/04/15 0410  NA 141 141 136  K 4.5 4.6 4.9  CL 110 114* 107  CO2 20* 17* 20*  GLUCOSE 180* 157* 423*  BUN 58* 55* 52*  CREATININE 8.07* 8.20* 7.35*  CALCIUM 7.1* 7.1* 7.2*  PHOS 5.7* 5.9* 3.6    Recent  Labs Lab 02/27/15 0148  03/02/15 0503 03/03/15 0550 03/04/15 0410  AST 22  --   --   --   --   ALT 16*  --   --   --   --   ALKPHOS 95  --   --   --   --   BILITOT 0.6  --   --   --   --   PROT 6.6  --   --   --   --   ALBUMIN 2.8*  < > 2.3* 2.2* 2.2*  < > = values in this interval not displayed.  Recent Labs Lab 03/02/15 0503 03/03/15 1015 03/04/15 0411  WBC 3.4* 3.4* 4.7  HGB 7.8* 8.2* 7.9*  HCT 23.3* 24.4* 24.4*  MCV 80.6 81.3 81.9  PLT 240 256 251

## 2015-03-04 NOTE — Progress Notes (Addendum)
[facility] PROGRESS NOTE  Edward Colon [id number] DOB: 09/24/1980 DOA: 02/27/2015 PCP: [name], MD  Brief Summary  Edward Colon is a 34 y.o. male with a past medical history significant for epilepsy, T1DM c/b CKD stage IV, renovascular HTN, and anemia of renal disease presented to [facility] for severeal days of cough, malaise, polyuria. And a hypoglycemic episode.   Assessment/Plan  ESRD, Patient is on transplant list at [facility].   -  Right arm fistula placed on 12/27 by Dr. Trula Slade -  Tunneled HD catheter currently being used for HD -  Started HD overnight from 12/27 through 12/28 -  SW and CM working on finding outpatient HD center  HTN: elevated.  Anticipate this may trend down with ongoing HD -  Continue to monitor -  Continue norvasc, labetalol, and hydralazine  Hyperkalemia, resolved after fluid hydration.  Hypoglycemia in IDDM with hyperglycemia today.  Missed morning dose of insulin -  Diabetes mellitus with complications including CKD and insensate neuropathy  Anemia of renal failure and mild iron deficiency -  Folate and b12 wnl -  Iron and epo per nephrology  -  hgb trending down gradually  History of seizures:  Stable.  Continue levetiracetam  Cough: Suspect this is a URI, with malaise, sore throat, congestion, cough.  Chest x-ray without findings.  Diet:  Renal, diabetic Access:  PIV IVF:  off Proph:  heparin  Code Status: full Family Communication: patient alone Disposition Plan:  Needs outpatient dialysis center, probable discharge early next week   Consultants:  Vascular  Renal.  Procedures: Right arm fistula HD catheter placement.   Antibiotics:  None  HPI/Subjective:  Feels well, no complaints.  Right arm not bothering him, but central line is uncomfortable in chest  Objective: Filed Vitals:   03/04/15 0620 03/04/15 0637 03/04/15 0814 03/04/15 1624  BP: 177/93 187/92 176/90 158/87  Pulse: 85 85 91  87  Temp:  98.5 F (36.9 C) 98.9 F (37.2 C) 98.6 F (37 C)  TempSrc:  Oral Oral Oral  Resp: 20 18 18 18   Height:      Weight:      SpO2:  98% 97% 98%    Intake/Output Summary (Last 24 hours) at 03/04/15 1726 Last data filed at 03/04/15 1625  Gross per 24 hour  Intake    960 ml  Output   3400 ml  Net  -2440 ml   Filed Weights   02/27/15 0706 03/04/15 0320 03/04/15 0600  Weight: 94.5 kg (208 lb 5.4 oz) 93.2 kg (205 lb 7.5 oz) 91.1 kg (200 lb 13.4 oz)   Body mass index is 28.02 kg/(m^2).  Exam:   General:  Adult male, No acute distress  HEENT:  NCAT, MMM  Cardiovascular:  RRR, nl S1, loud S2, 2+ pulses, warm extremities  Respiratory:  CTAB, no increased WOB  Abdomen:   NABS, soft, NT/ND  MSK:   Normal tone and bulk, no LEE, right chest with HD catheter in place.  Right forearm incision c/d/i, no erythema or induration.    Data Reviewed: Basic Metabolic Panel:  Recent Labs Lab 02/28/15 0544 03/01/15 0524 03/02/15 0503 03/03/15 0550 03/04/15 0410  NA 137 138 141 141 136  K 4.8 4.9 4.5 4.6 4.9  CL 106 108 110 114* 107  CO2 20* 21* 20* 17* 20*  GLUCOSE 301* 283* 180* 157* 423*  BUN 63* 62* 58* 55* 52*  CREATININE 7.94* 8.40* 8.07* 8.20* 7.35*  CALCIUM 6.6* 6.9* 7.1* 7.1* 7.2*  PHOS 4.8* 5.1* 5.7* 5.9* 3.6   Liver Function Tests:  Recent Labs Lab 02/27/15 0148 02/28/15 0544 03/01/15 0524 03/02/15 0503 03/03/15 0550 03/04/15 0410  AST 22  --   --   --   --   --   ALT 16*  --   --   --   --   --   ALKPHOS 95  --   --   --   --   --   BILITOT 0.6  --   --   --   --   --   PROT 6.6  --   --   --   --   --   ALBUMIN 2.8* 2.4* 2.3* 2.3* 2.2* 2.2*   No results for input(s): LIPASE, AMYLASE in the last 168 hours. No results for input(s): AMMONIA in the last 168 hours. CBC:  Recent Labs Lab 02/27/15 0148 02/28/15 0544 03/02/15 0503 03/03/15 1015 03/04/15 0411  WBC 8.3 3.3* 3.4* 3.4* 4.7  HGB 10.0* 7.4* 7.8* 8.2* 7.9*  HCT 30.0* 22.3* 23.3*  24.4* 24.4*  MCV 82.2 80.8 80.6 81.3 81.9  PLT 275 227 240 256 251    Recent Results (from the past 240 hour(s))  Surgical pcr screen     Status: Abnormal   Collection Time: 03/03/15  6:06 AM  Result Value Ref Range Status   MRSA, PCR NEGATIVE NEGATIVE Final   Staphylococcus aureus POSITIVE (A) NEGATIVE Final    Comment:        The Xpert SA Assay (FDA approved for NASAL specimens in patients over 70 years of age), is one component of a comprehensive surveillance program.  Test performance has been validated by [facility] for patients greater than or equal to 63 year old. It is not intended to diagnose infection nor to guide or monitor treatment.      Studies: Dg Chest Port 1 View  03/03/2015  CLINICAL DATA:  Status post dialysis catheter insertion EXAM: PORTABLE CHEST 1 VIEW COMPARISON:  02/27/2015 FINDINGS: There is a dual-lumen right-sided central venous catheter with the tip projecting over the cavoatrial junction. There is bilateral interstitial prominence and prominence of the central pulmonary vasculature. There are low lung volumes. There is no pleural effusion or pneumothorax. The heart and mediastinal contours are unremarkable. The osseous structures are unremarkable. IMPRESSION: Dual-lumen right-sided central venous catheter with the tip projecting over the cavoatrial junction. Electronically Signed   By: Kathreen Devoid   On: 03/03/2015 18:25   Dg Fluoro Guide Cv Line-no Report  03/03/2015  CLINICAL DATA:  FLOURO GUIDE CV LINE Fluoroscopy was utilized by the requesting physician.  No radiographic interpretation.    Scheduled Meds: . amLODipine  10 mg Oral Daily  . calcitRIOL  0.25 mcg Oral Daily  . darbepoetin (ARANESP) injection - NON-DIALYSIS  200 mcg Subcutaneous Q Sun-1800  . ferric gluconate (FERRLECIT/NULECIT) IV  250 mg Intravenous Q48H  . heparin  5,000 Units Subcutaneous 3 times per day  . hydrALAZINE  50 mg Oral 3 times per day  . insulin aspart  0-9  Units Subcutaneous TID WC  . insulin glargine  25 Units Subcutaneous q morning - 10a  . labetalol  200 mg Oral BID  . levETIRAcetam  500 mg Oral BID   Continuous Infusions: . sodium chloride      Principal Problem:   Acute on chronic renal failure (HCC) Active Problems:   Seizures (HCC)   Hypertension   Hyperkalemia   Hypoglycemia   Anemia  URI (upper respiratory infection)   Acute-on-chronic kidney injury ([place])    Time spent: 30 min    Jyles Sontag, [place] [facility] Pager [phone]. If 7PM-7AM, please contact night-coverage at [email or web address], password [facility] 03/04/2015, 5:26 PM  LOS: 5 days

## 2015-03-05 LAB — CBC
HCT: 26.1 % — ABNORMAL LOW (ref 39.0–52.0)
Hemoglobin: 8.4 g/dL — ABNORMAL LOW (ref 13.0–17.0)
MCH: 26.3 pg (ref 26.0–34.0)
MCHC: 32.2 g/dL (ref 30.0–36.0)
MCV: 81.8 fL (ref 78.0–100.0)
PLATELETS: 311 10*3/uL (ref 150–400)
RBC: 3.19 MIL/uL — AB (ref 4.22–5.81)
RDW: 14.3 % (ref 11.5–15.5)
WBC: 4.5 10*3/uL (ref 4.0–10.5)

## 2015-03-05 LAB — RENAL FUNCTION PANEL
ALBUMIN: 2.3 g/dL — AB (ref 3.5–5.0)
Anion gap: 7 (ref 5–15)
BUN: 38 mg/dL — AB (ref 6–20)
CALCIUM: 7.6 mg/dL — AB (ref 8.9–10.3)
CHLORIDE: 110 mmol/L (ref 101–111)
CO2: 25 mmol/L (ref 22–32)
CREATININE: 7.14 mg/dL — AB (ref 0.61–1.24)
GFR, EST AFRICAN AMERICAN: 11 mL/min — AB (ref >60)
GFR, EST NON AFRICAN AMERICAN: 9 mL/min — AB (ref >60)
Glucose, Bld: 67 mg/dL (ref 65–99)
PHOSPHORUS: 5 mg/dL — AB (ref 2.5–4.6)
Potassium: 3.8 mmol/L (ref 3.5–5.1)
SODIUM: 142 mmol/L (ref 135–145)

## 2015-03-05 LAB — GLUCOSE, CAPILLARY
GLUCOSE-CAPILLARY: 77 mg/dL (ref 65–99)
GLUCOSE-CAPILLARY: 164 mg/dL — AB (ref 65–99)
GLUCOSE-CAPILLARY: 170 mg/dL — AB (ref 65–99)
GLUCOSE-CAPILLARY: 160 mg/dL — AB (ref 65–99)

## 2015-03-05 LAB — HEPATITIS B SURFACE ANTIGEN: HEP B S AG: NEGATIVE

## 2015-03-05 LAB — HEPATITIS B CORE ANTIBODY, TOTAL: Hep B Core Total Ab: NEGATIVE

## 2015-03-05 LAB — HEPATITIS B SURFACE ANTIBODY,QUALITATIVE: Hep B S Ab: REACTIVE

## 2015-03-05 MED ORDER — ALTEPLASE 2 MG IJ SOLR: 2.0000 mg | Freq: Once | INTRAMUSCULAR | Status: DC | PRN

## 2015-03-05 MED ORDER — SODIUM CHLORIDE 0.9 % IV SOLN: 100.0000 mL | INTRAVENOUS | Status: DC | PRN

## 2015-03-05 MED ORDER — PENTAFLUOROPROP-TETRAFLUOROETH EX AERO: 1.0000 "application " | INHALATION_SPRAY | CUTANEOUS | Status: DC | PRN

## 2015-03-05 MED ORDER — LIDOCAINE HCL (PF) 1 % IJ SOLN: 5.0000 mL | INTRAMUSCULAR | Status: DC | PRN

## 2015-03-05 MED ORDER — HEPARIN SODIUM (PORCINE) 1000 UNIT/ML DIALYSIS: 2000.0000 [IU] | INTRAMUSCULAR | Status: DC | PRN

## 2015-03-05 MED ORDER — LIDOCAINE-PRILOCAINE 2.5-2.5 % EX CREA: 1.0000 "application " | TOPICAL_CREAM | CUTANEOUS | Status: DC | PRN

## 2015-03-05 MED ORDER — HEPARIN SODIUM (PORCINE) 1000 UNIT/ML DIALYSIS: 1000.0000 [IU] | INTRAMUSCULAR | Status: DC | PRN

## 2015-03-05 NOTE — Op Note (Signed)
Patient name: Edward Colon MRN: [id number] DOB: 1980-10-20 Sex: male  02/27/2015 - 03/03/2015 Pre-operative Diagnosis: ESRD Post-operative diagnosis:  Same Surgeon:  Annamarie Major Assistants:  Leontine Locket Procedure:   #1:  Ultrasound guided placement of right internal jugular tunneled central venous catheter   #2:  Right Radio-cephalic AV fistula Anesthesia:  General Blood Loss:  See anesthesia record Specimens:  none  Findings:  Vein was 72mm, 20mm artery  Indications:  Patient presented with acute on chronic renal failure with need for dialysis.  Procedure:  The patient was identified in the holding area and taken to [facility]  The patient was then placed supine on the table. general anesthesia was administered.  The patient was prepped and draped in the usual sterile fashion.  A time out was called and antibiotics were administered.  Ultrasound was used to evaluate the right internal jugular vein which was widely patent and easily compressible.  A digital ultrasound image was acquired.  1% lidocaine was used for local anesthesia.  A skin nick was made in the right neck with an 11 blade.  Under ultrasound guidance, the right internal jugular vein was cannulated with an 18-gauge needle.  A 035 wire was then advanced down into the inferior vena cava.  Sequential dilators were used to dilate the subcutaneous tract followed by placement of the sheath.  A 23 cm Pallindrome catheter was then inserted.  The tip was positioned at the cavoatrial junction.  A site was selected for the skin exit site.  This was anesthetized with 1% lidocaine as well as the tunnel.  A subcutaneous tunnel was then created and the catheter was brought through the tunnel with the cuff situated at the skin exit site.  Fluoroscopy was used to confirm that the catheter tip was in good position and that there were no changes within the catheter.  The catheter was then connected to the 2 ports.  Both flushed and  aspirated without difficulty.  The catheter was sewn into position with a 3-0 nylon.  The incision the neck was closed with 4-0 Vicryl and Dermabond.  The catheter was filled with the appropriate volumes of heparin.  There were no immediate complications.  Next attention was turned towards the right arm which was prepped and draped sterilely.  I evaluated the cephalic vein with ultrasound.  This vein appeared to be adequate down to the level of the wrist.  1% lidocaine was used for local anesthesia.  A longitudinal incision was made just proximal to the wrist.  I dissected out the cephalic vein and divide its branches between silk ties.  The vein was approximately 3 mm and healthy in appearance.  It was marked with an ink pen for orientation.  I then dissected out the radial artery.  This was a 2 mm healthy artery.  3000 units of heparin were given.  The vein was then ligated distally.  It was instilled with heparin saline and dilated nicely.  The artery was then occluded.  A #11 blade was used to make an arteriotomy which was extended longitudinally with Potts scissors.  The vein was then cut to the appropriate length.  A running end-to-side anastomosis was created with 6-0 Prolene.  Prior to completion the appropriate flushing maneuvers were performed and the anastomosis was completed.  Initially there was a good thrill within the fistula, however this dissipated.  There was a side branch on the fistula which I took the tie off of and  passed a Fogarty catheter centrally without any resistance.  No clot was evacuated but there was poor flow within the fistula.  I had difficulty advancing the Fogarty catheter across the anastomosis.  I then elected to take the anastomosis down and redo the anastomosis.  The vein was shortened slightly.  The anastomosis was then redone using a running 6-0 Prolene suture.  After the appropriate flushing maneuvers were performed the anastomosis was completed.  This time there was a  good thrill within the fistula.  There is also triphasic Doppler signal in the radial artery distal to the fistula.  I did not reverse the heparin.  The incision was closed with 2 layers of 3-0 Vicryl followed by Dermabond.   Disposition:  To PACU in stable condition.   Theotis Burrow, M.D. [facility] Office: [phone] Pager:  [phone]

## 2015-03-05 NOTE — Progress Notes (Signed)
[facility] PROGRESS NOTE  Edward Colon [id number] DOB: [birth year] DOA: 02/27/2015 PCP: [name], MD  Brief Summary  Edward Colon is a 34 y.o. male with a past medical history significant for epilepsy, T1DM c/b CKD stage IV, renovascular HTN, and anemia of renal disease presented to [facility] for severeal days of cough, malaise, polyuria. And a hypoglycemic episode.   Assessment/Plan  ESRD, Patient is on transplant list at [facility].   -  Right arm fistula placed on 12/27 by Dr. Trula Slade -  Tunneled HD catheter currently being used for HD -  Day 2 of HD today -  SW and CM working on finding outpatient HD center  HTN: elevated.  Trending down with ongoing HD -  Continue norvasc, labetalol, and hydralazine  Hyperkalemia, resolved after fluid hydration.  Hypoglycemia in IDDM with hyperglycemia today.  Missed morning dose of insulin -  Diabetes mellitus with complications including CKD and insensate neuropathy  Anemia of renal failure and mild iron deficiency -  Folate and b12 wnl -  Iron and epo per nephrology  -  hgb trending down gradually  History of seizures:  Stable.  Continue levetiracetam  Cough: Suspect this is a URI, with malaise, sore throat, congestion, cough.  Chest x-ray without findings.  Diet:  Renal, diabetic Access:  PIV IVF:  off Proph:  heparin  Code Status: full Family Communication: patient alone Disposition Plan:  Needs outpatient dialysis center, probable discharge early next week   Consultants:  Vascular  Renal.  Procedures: Right arm fistula HD catheter placement.   Antibiotics:  None  HPI/Subjective:  Feels well, no complaints.  Right arm not bothering him, but central line is uncomfortable in chest  Objective: Filed Vitals:   03/05/15 0900 03/05/15 0930 03/05/15 0940 03/05/15 1042  BP: 151/92 136/87 147/88 153/79  Pulse: 89 90 91 92  Temp:   98.4 F (36.9 C) 99 F (37.2 C)  TempSrc:    Oral   Resp:   15 16  Height:      Weight:      SpO2:    98%    Intake/Output Summary (Last 24 hours) at 03/05/15 1706 Last data filed at 03/05/15 1357  Gross per 24 hour  Intake    720 ml  Output   2825 ml  Net  -2105 ml   Filed Weights   03/04/15 0600 03/04/15 2029 03/05/15 0640  Weight: 91.1 kg (200 lb 13.4 oz) 93 kg (205 lb 0.4 oz) 91.8 kg (202 lb 6.1 oz)   Body mass index is 28.24 kg/(m^2).  Exam:   General:  Adult male, No acute distress, seen in HD today  Cardiovascular:  RRR, nl S1, loud S2, 2+ pulses, warm extremities  Respiratory:  CTAB, no increased WOB  Abdomen:   NABS, soft, NT/ND  MSK:   Normal tone and bulk, no LEE, right chest with HD catheter in place.  Right forearm incision c/d/i, no erythema or induration.    Data Reviewed: Basic Metabolic Panel:  Recent Labs Lab 03/01/15 0524 03/02/15 0503 03/03/15 0550 03/04/15 0410 03/05/15 0712  NA 138 141 141 136 142  K 4.9 4.5 4.6 4.9 3.8  CL 108 110 114* 107 110  CO2 21* 20* 17* 20* 25  GLUCOSE 283* 180* 157* 423* 67  BUN 62* 58* 55* 52* 38*  CREATININE 8.40* 8.07* 8.20* 7.35* 7.14*  CALCIUM 6.9* 7.1* 7.1* 7.2* 7.6*  PHOS 5.1* 5.7* 5.9* 3.6 5.0*   Liver Function Tests:  Recent  Labs Lab 02/27/15 0148  03/01/15 0524 03/02/15 0503 03/03/15 0550 03/04/15 0410 03/05/15 0712  AST 22  --   --   --   --   --   --   ALT 16*  --   --   --   --   --   --   ALKPHOS 95  --   --   --   --   --   --   BILITOT 0.6  --   --   --   --   --   --   PROT 6.6  --   --   --   --   --   --   ALBUMIN 2.8*  < > 2.3* 2.3* 2.2* 2.2* 2.3*  < > = values in this interval not displayed. No results for input(s): LIPASE, AMYLASE in the last 168 hours. No results for input(s): AMMONIA in the last 168 hours. CBC:  Recent Labs Lab 02/28/15 0544 03/02/15 0503 03/03/15 1015 03/04/15 0411 03/05/15 0700  WBC 3.3* 3.4* 3.4* 4.7 4.5  HGB 7.4* 7.8* 8.2* 7.9* 8.4*  HCT 22.3* 23.3* 24.4* 24.4* 26.1*  MCV 80.8 80.6 81.3 81.9  81.8  PLT 227 240 256 251 311    Recent Results (from the past 240 hour(s))  Surgical pcr screen     Status: Abnormal   Collection Time: 03/03/15  6:06 AM  Result Value Ref Range Status   MRSA, PCR NEGATIVE NEGATIVE Final   Staphylococcus aureus POSITIVE (A) NEGATIVE Final    Comment:        The Xpert SA Assay (FDA approved for NASAL specimens in patients over 33 years of age), is one component of a comprehensive surveillance program.  Test performance has been validated by [facility] for patients greater than or equal to 61 year old. It is not intended to diagnose infection nor to guide or monitor treatment.      Studies: Dg Chest Port 1 View  03/03/2015  CLINICAL DATA:  Status post dialysis catheter insertion EXAM: PORTABLE CHEST 1 VIEW COMPARISON:  02/27/2015 FINDINGS: There is a dual-lumen right-sided central venous catheter with the tip projecting over the cavoatrial junction. There is bilateral interstitial prominence and prominence of the central pulmonary vasculature. There are low lung volumes. There is no pleural effusion or pneumothorax. The heart and mediastinal contours are unremarkable. The osseous structures are unremarkable. IMPRESSION: Dual-lumen right-sided central venous catheter with the tip projecting over the cavoatrial junction. Electronically Signed   By: Kathreen Devoid   On: 03/03/2015 18:25    Scheduled Meds: . amLODipine  10 mg Oral Daily  . calcitRIOL  0.25 mcg Oral Daily  . darbepoetin (ARANESP) injection - NON-DIALYSIS  200 mcg Subcutaneous Q Sun-1800  . ferric gluconate (FERRLECIT/NULECIT) IV  250 mg Intravenous Q48H  . heparin  5,000 Units Subcutaneous 3 times per day  . hydrALAZINE  50 mg Oral 3 times per day  . insulin aspart  0-9 Units Subcutaneous TID WC  . insulin aspart  2 Units Subcutaneous TID WC  . insulin glargine  25 Units Subcutaneous q morning - 10a  . labetalol  200 mg Oral BID  . levETIRAcetam  500 mg Oral BID   Continuous  Infusions: . sodium chloride      Principal Problem:   Acute on chronic renal failure (HCC) Active Problems:   Seizures (HCC)   Hypertension   Hyperkalemia   Hypoglycemia   Anemia   URI (upper respiratory infection)  Acute-on-chronic kidney injury ([place])    Time spent: 30 min    Jarmaine Ehrler, [place] [facility] Pager [phone]. If 7PM-7AM, please contact night-coverage at [email or web address], password [facility] 03/05/2015, 5:06 PM  LOS: 6 days

## 2015-03-05 NOTE — Progress Notes (Signed)
  [facility] Progress Note   Subjective: stable  Filed Vitals:   03/05/15 0900 03/05/15 0930 03/05/15 0940 03/05/15 1042  BP: 151/92 136/87 147/88 153/79  Pulse: 89 90 91 92  Temp:   98.4 F (36.9 C) 99 F (37.2 C)  TempSrc:    Oral  Resp:   15 16  Height:      Weight:      SpO2:    98%    Inpatient medications: . amLODipine  10 mg Oral Daily  . calcitRIOL  0.25 mcg Oral Daily  . darbepoetin (ARANESP) injection - NON-DIALYSIS  200 mcg Subcutaneous Q Sun-1800  . ferric gluconate (FERRLECIT/NULECIT) IV  250 mg Intravenous Q48H  . heparin  5,000 Units Subcutaneous 3 times per day  . hydrALAZINE  50 mg Oral 3 times per day  . insulin aspart  0-9 Units Subcutaneous TID WC  . insulin aspart  2 Units Subcutaneous TID WC  . insulin glargine  25 Units Subcutaneous q morning - 10a  . labetalol  200 mg Oral BID  . levETIRAcetam  500 mg Oral BID   . sodium chloride     acetaminophen **OR** acetaminophen, guaiFENesin-dextromethorphan, ondansetron **OR** ondansetron (ZOFRAN) IV, oxyCODONE  Exam: AAM no distress No jvd Chest clear bilat RRR  Abd soft ntnd no ascites Ext no edema Neuro no asterixis      Assessment: 1 CKD 5, new ESRD - had 2nd HD today.  IJ cath/ R arm RC AVF 12/27 2 HTN on 3 bp meds 3 DM1  4 Hypoglycemic episode  Plan - HD tomorrow, CLIP in process. Hopefully home in 1-2 days.     Kelly Splinter MD [facility] pager [phone]    cell [phone] 03/05/2015, 2:01 PM    Recent Labs Lab 03/03/15 0550 03/04/15 0410 03/05/15 0712  NA 141 136 142  K 4.6 4.9 3.8  CL 114* 107 110  CO2 17* 20* 25  GLUCOSE 157* 423* 67  BUN 55* 52* 38*  CREATININE 8.20* 7.35* 7.14*  CALCIUM 7.1* 7.2* 7.6*  PHOS 5.9* 3.6 5.0*    Recent Labs Lab 02/27/15 0148  03/03/15 0550 03/04/15 0410 03/05/15 0712  AST 22  --   --   --   --   ALT 16*  --   --   --   --   ALKPHOS 95  --   --   --   --   BILITOT 0.6  --   --   --   --   PROT 6.6   --   --   --   --   ALBUMIN 2.8*  < > 2.2* 2.2* 2.3*  < > = values in this interval not displayed.  Recent Labs Lab 03/03/15 1015 03/04/15 0411 03/05/15 0700  WBC 3.4* 4.7 4.5  HGB 8.2* 7.9* 8.4*  HCT 24.4* 24.4* 26.1*  MCV 81.3 81.9 81.8  PLT 256 251 311

## 2015-03-06 DIAGNOSIS — R739 Hyperglycemia, unspecified: Secondary | ICD-10-CM

## 2015-03-06 LAB — GLUCOSE, CAPILLARY
GLUCOSE-CAPILLARY: 149 mg/dL — AB (ref 65–99)
GLUCOSE-CAPILLARY: 44 mg/dL — AB (ref 65–99)
Glucose-Capillary: 231 mg/dL — ABNORMAL HIGH (ref 65–99)
Glucose-Capillary: 104 mg/dL — ABNORMAL HIGH (ref 65–99)

## 2015-03-06 LAB — RENAL FUNCTION PANEL
ALBUMIN: 2.2 g/dL — AB (ref 3.5–5.0)
ANION GAP: 9 (ref 5–15)
BUN: 23 mg/dL — AB (ref 6–20)
CALCIUM: 7.6 mg/dL — AB (ref 8.9–10.3)
CO2: 28 mmol/L (ref 22–32)
Chloride: 102 mmol/L (ref 101–111)
Creatinine, Ser: 6.35 mg/dL — ABNORMAL HIGH (ref 0.61–1.24)
GFR calc Af Amer: 12 mL/min — ABNORMAL LOW (ref >60)
GFR calc non Af Amer: 11 mL/min — ABNORMAL LOW (ref >60)
GLUCOSE: 265 mg/dL — AB (ref 65–99)
PHOSPHORUS: 5 mg/dL — AB (ref 2.5–4.6)
Potassium: 4.1 mmol/L (ref 3.5–5.1)
SODIUM: 139 mmol/L (ref 135–145)

## 2015-03-06 LAB — BASIC METABOLIC PANEL
ANION GAP: 8 (ref 5–15)
BUN: 22 mg/dL — ABNORMAL HIGH (ref 6–20)
CALCIUM: 7.2 mg/dL — AB (ref 8.9–10.3)
CO2: 23 mmol/L (ref 22–32)
Chloride: 99 mmol/L — ABNORMAL LOW (ref 101–111)
Creatinine, Ser: 5.51 mg/dL — ABNORMAL HIGH (ref 0.61–1.24)
GFR calc Af Amer: 14 mL/min — ABNORMAL LOW (ref >60)
GFR, EST NON AFRICAN AMERICAN: 12 mL/min — AB (ref >60)
GLUCOSE: 601 mg/dL — AB (ref 65–99)
POTASSIUM: 5 mmol/L (ref 3.5–5.1)
SODIUM: 130 mmol/L — AB (ref 135–145)

## 2015-03-06 MED ORDER — INSULIN ASPART 100 UNIT/ML ~~LOC~~ SOLN
15.0000 [IU] | Freq: Once | SUBCUTANEOUS | Status: AC
  Administered 2015-03-06: 15 [IU] via SUBCUTANEOUS

## 2015-03-06 MED ORDER — ALTEPLASE 2 MG IJ SOLR
2.0000 mg | Freq: Once | INTRAMUSCULAR | Status: DC | PRN
  Filled 2015-03-06: qty 2

## 2015-03-06 MED ORDER — LIDOCAINE-PRILOCAINE 2.5-2.5 % EX CREA
1.0000 "application " | TOPICAL_CREAM | CUTANEOUS | Status: DC | PRN
  Filled 2015-03-06: qty 5

## 2015-03-06 MED ORDER — INSULIN ASPART 100 UNIT/ML ~~LOC~~ SOLN
0.0000 [IU] | Freq: Three times a day (TID) | SUBCUTANEOUS | Status: DC
  Administered 2015-03-07: 3 [IU] via SUBCUTANEOUS

## 2015-03-06 MED ORDER — INSULIN ASPART 100 UNIT/ML ~~LOC~~ SOLN
10.0000 [IU] | Freq: Once | SUBCUTANEOUS | Status: AC
  Administered 2015-03-06: 10 [IU] via SUBCUTANEOUS

## 2015-03-06 MED ORDER — HEPARIN SODIUM (PORCINE) 1000 UNIT/ML DIALYSIS: 2000.0000 [IU] | INTRAMUSCULAR | Status: DC | PRN

## 2015-03-06 MED ORDER — CALCIUM ACETATE (PHOS BINDER) 667 MG PO CAPS
667.0000 mg | ORAL_CAPSULE | Freq: Three times a day (TID) | ORAL | Status: DC
  Administered 2015-03-06 – 2015-03-07 (×2): 667 mg via ORAL
  Filled 2015-03-06: qty 1

## 2015-03-06 MED ORDER — INSULIN ASPART 100 UNIT/ML ~~LOC~~ SOLN: 0.0000 [IU] | Freq: Every day | SUBCUTANEOUS | Status: DC

## 2015-03-06 MED ORDER — DEXTROSE 50 % IV SOLN
INTRAVENOUS | Status: AC
  Filled 2015-03-06: qty 50

## 2015-03-06 MED ORDER — SODIUM CHLORIDE 0.9 % IV SOLN: 100.0000 mL | INTRAVENOUS | Status: DC | PRN

## 2015-03-06 MED ORDER — PENTAFLUOROPROP-TETRAFLUOROETH EX AERO: 1.0000 "application " | INHALATION_SPRAY | CUTANEOUS | Status: DC | PRN

## 2015-03-06 MED ORDER — AMLODIPINE BESYLATE 10 MG PO TABS: 10.0000 mg | ORAL_TABLET | Freq: Every day | ORAL | Status: DC

## 2015-03-06 MED ORDER — HEPARIN SODIUM (PORCINE) 1000 UNIT/ML DIALYSIS: 1000.0000 [IU] | INTRAMUSCULAR | Status: DC | PRN

## 2015-03-06 MED ORDER — LIDOCAINE HCL (PF) 1 % IJ SOLN: 5.0000 mL | INTRAMUSCULAR | Status: DC | PRN

## 2015-03-06 MED ORDER — DARBEPOETIN ALFA 200 MCG/0.4ML IJ SOSY: 200.0000 ug | PREFILLED_SYRINGE | INTRAMUSCULAR | Status: DC

## 2015-03-06 MED ORDER — DOXERCALCIFEROL 0.5 MCG PO CAPS
0.5000 ug | ORAL_CAPSULE | ORAL | Status: DC
  Filled 2015-03-06: qty 1

## 2015-03-06 MED ORDER — RENA-VITE PO TABS
1.0000 | ORAL_TABLET | Freq: Every day | ORAL | Status: DC
  Administered 2015-03-06: 1 via ORAL
  Filled 2015-03-06: qty 1

## 2015-03-06 NOTE — Progress Notes (Signed)
CRITICAL VALUE ALERT  Critical value received:  Glucose 601  Date of notification:  03/06/15  Time of notification:  7:24p  Critical value read back:Yes  Nurse who received alert:  Xylia Scherger  MD notified (1st page):  Yes  Time of first page:  7:30p  MD notified (2nd page):  Time of second page:  Responding MD: Harless Litten (NP)  Time MD responded: 7:35p

## 2015-03-06 NOTE — Progress Notes (Signed)
[facility] Progress Note  Assessment/Plan: 1. New ESRD -Clipped to [facility] - TTS - date/time pending- s/p 3rd HD tmt today; on transplant list at [facility], AVF placed 12/27- I cannot hear a bruit or feel a thrill- if AVF doesn't fly - can refer back to [facility] next week and he can get a new access as an outpt (note from Dr. Venida Jarvis  states it is dopplerable) to f/u with [facility] in 4-6 weeks  2. Anemia - Hgb 8.4 - on max ESA- last dose 12/25 - next 1/2- repleting Fe s/p 2 of 4 250 doses IV Fe 3. Secondary hyperparathyroidism - on daily calcitriol to 0.5 calcitriol TTS (his center doesn't use calcitriol and he should stop his daily calcitriol)  check iPTH with Jan labs; P 5.0 - start phoslo 1 ac 4. BP/ volume - serial HD lowering volume 2 L Wed, 2.2 Thurs and 2.8 today on norvasc 10, labetolol 200 bid and hydralazine 50 tid- changed norvasc to q HS - already got dose today 5. Nutrition - renal carb mod/add renavite 6. Disp - plan d/c tomorrow post HD and then start at [facility] on Tuesday  Myriam Jacobson, PA-C [place] [phone] 03/06/2015,1:49 PM  LOS: 7 days   Pt seen, examined and agree w A/P as above.  Kelly Splinter MD [facility] pager [phone]    cell [phone] 03/06/2015, 3:51 PM    Subjective:  No c/o  Objective Filed Vitals:   03/06/15 1105 03/06/15 1107 03/06/15 1108 03/06/15 1144  BP: 108/70 99/60 116/73 111/76  Pulse: 76 80 80 88  Temp:    98.4 F (36.9 C)  TempSrc:    Oral  Resp:    20  Height:      Weight:  86.9 kg (191 lb 9.3 oz)    SpO2:    100%   Physical Exam General: NAD Heart: RRR Lungs: no rales Abdomen: soft NT Extremities: no LE edema Dialysis Access:  Right IJ right lower AVF I cannot appreciate a bruit or thrill  Dialysis Orders:  Additional Objective Labs: Basic Metabolic Panel:  Recent Labs Lab 03/04/15 0410 03/05/15 0712 03/06/15 0730  NA 136 142 139  K 4.9 3.8 4.1  CL 107 110 102  CO2 20*  25 28  GLUCOSE 423* 67 265*  BUN 52* 38* 23*  CREATININE 7.35* 7.14* 6.35*  CALCIUM 7.2* 7.6* 7.6*  PHOS 3.6 5.0* 5.0*   Liver Function Tests:  Recent Labs Lab 03/04/15 0410 03/05/15 0712 03/06/15 0730  ALBUMIN 2.2* 2.3* 2.2*   CBC:  Recent Labs Lab 02/28/15 0544 03/02/15 0503 03/03/15 1015 03/04/15 0411 03/05/15 0700  WBC 3.3* 3.4* 3.4* 4.7 4.5  HGB 7.4* 7.8* 8.2* 7.9* 8.4*  HCT 22.3* 23.3* 24.4* 24.4* 26.1*  MCV 80.8 80.6 81.3 81.9 81.8  PLT 227 240 256 251 311  CBG:  Recent Labs Lab 03/05/15 1021 03/05/15 1223 03/05/15 1642 03/05/15 2111 03/06/15 1139  GLUCAP 77 164* 170* 160* 231*   Iron Studies:  Recent Labs  03/04/15 0411  IRON 23*  TIBC 192*  FERRITIN 117  Medications: . sodium chloride     . amLODipine  10 mg Oral Daily  . calcitRIOL  0.25 mcg Oral Daily  . darbepoetin (ARANESP) injection - NON-DIALYSIS  200 mcg Subcutaneous Q Sun-1800  . ferric gluconate (FERRLECIT/NULECIT) IV  250 mg Intravenous Q48H  . heparin  5,000 Units Subcutaneous 3 times per day  . hydrALAZINE  50 mg Oral 3 times  per day  . insulin aspart  0-9 Units Subcutaneous TID WC  . insulin aspart  2 Units Subcutaneous TID WC  . insulin glargine  25 Units Subcutaneous q morning - 10a  . labetalol  200 mg Oral BID  . levETIRAcetam  500 mg Oral BID

## 2015-03-06 NOTE — Progress Notes (Signed)
[facility] PROGRESS NOTE  JOS FLEWELLING [id number] DOB: [birth year] DOA: 02/27/2015 PCP: [name], MD  Brief Summary  Edward Colon is a 34 y.o. male with a past medical history significant for epilepsy, T1DM c/b CKD stage IV, renovascular HTN, and anemia of renal disease presented to [facility] for severeal days of cough, malaise, polyuria. And a hypoglycemic episode.   Assessment/Plan  ESRD, Patient is on transplant list at [facility].   -  Right arm fistula placed on 12/27 by Dr. Trula Slade -  Tunneled HD catheter currently being used for HD -  Day 3 of HD today -  SW and CM working on finding outpatient HD center  HTN: trended down at end of HD today -  Continue norvasc, labetalol, and hydralazine but may need to decrease doses if persistently low normal BPs  Hyperkalemia, resolved after fluid hydration.  T1DM, ate two meals at lunchtime today and is now very hyperglycemic -  Hold dinner tray  -  Give aspart 10 units now -  Repeat CBG in 2 hours and give pre-dinner SSI and then he may eat -  Continue lantus 25 units with 2 units aspart with meals  Anemia of renal failure and mild iron deficiency -  Folate and b12 wnl -  Iron and epo per nephrology  -  hgb trending down gradually  History of seizures:  Stable.  Continue levetiracetam  Cough: Suspect this is a URI, with malaise, sore throat, congestion, cough.  Chest x-ray without findings.  Diet:  Renal, diabetic Access:  PIV IVF:  off Proph:  heparin  Code Status: full Family Communication: patient alone Disposition Plan:  Needs outpatient dialysis center, discharge as soon as dialysis center found   Consultants:  Vascular  Renal.  Procedures: Right arm fistula HD catheter placement.   Antibiotics:  None  HPI/Subjective:  Feels well, no complaints.  Denies cough, nausea, vomiting, diarrhea.   Objective: Filed Vitals:   03/06/15 1105 03/06/15 1107 03/06/15 1108 03/06/15 1144   BP: 108/70 99/60 116/73 111/76  Pulse: 76 80 80 88  Temp:    98.4 F (36.9 C)  TempSrc:    Oral  Resp:    20  Height:      Weight:  86.9 kg (191 lb 9.3 oz)    SpO2:    100%    Intake/Output Summary (Last 24 hours) at 03/06/15 1819 Last data filed at 03/06/15 1100  Gross per 24 hour  Intake    420 ml  Output   3450 ml  Net  -3030 ml   Filed Weights   03/06/15 0440 03/06/15 0730 03/06/15 1107  Weight: 85.8 kg (189 lb 2.5 oz) 89.4 kg (197 lb 1.5 oz) 86.9 kg (191 lb 9.3 oz)   Body mass index is 26.73 kg/(m^2).  Exam:   General:  Adult male, No acute distress  Cardiovascular:  RRR, nl S1, loud S2, 2+ pulses, warm extremities  Respiratory:  CTAB, no increased WOB  Abdomen:   NABS, soft, NT/ND  MSK:   Normal tone and bulk, no LEE, right chest with HD catheter in place.  Right forearm incision c/d/i, no erythema or induration.    Data Reviewed: Basic Metabolic Panel:  Recent Labs Lab 03/02/15 0503 03/03/15 0550 03/04/15 0410 03/05/15 0712 03/06/15 0730  NA 141 141 136 142 139  K 4.5 4.6 4.9 3.8 4.1  CL 110 114* 107 110 102  CO2 20* 17* 20* 25 28  GLUCOSE 180* 157* 423* 67 265*  BUN 58* 55* 52* 38* 23*  CREATININE 8.07* 8.20* 7.35* 7.14* 6.35*  CALCIUM 7.1* 7.1* 7.2* 7.6* 7.6*  PHOS 5.7* 5.9* 3.6 5.0* 5.0*   Liver Function Tests:  Recent Labs Lab 03/02/15 0503 03/03/15 0550 03/04/15 0410 03/05/15 0712 03/06/15 0730  ALBUMIN 2.3* 2.2* 2.2* 2.3* 2.2*   No results for input(s): LIPASE, AMYLASE in the last 168 hours. No results for input(s): AMMONIA in the last 168 hours. CBC:  Recent Labs Lab 02/28/15 0544 03/02/15 0503 03/03/15 1015 03/04/15 0411 03/05/15 0700  WBC 3.3* 3.4* 3.4* 4.7 4.5  HGB 7.4* 7.8* 8.2* 7.9* 8.4*  HCT 22.3* 23.3* 24.4* 24.4* 26.1*  MCV 80.8 80.6 81.3 81.9 81.8  PLT 227 240 256 251 311    Recent Results (from the past 240 hour(s))  Surgical pcr screen     Status: Abnormal   Collection Time: 03/03/15  6:06 AM  Result  Value Ref Range Status   MRSA, PCR NEGATIVE NEGATIVE Final   Staphylococcus aureus POSITIVE (A) NEGATIVE Final    Comment:        The Xpert SA Assay (FDA approved for NASAL specimens in patients over 49 years of age), is one component of a comprehensive surveillance program.  Test performance has been validated by [facility] for patients greater than or equal to 35 year old. It is not intended to diagnose infection nor to guide or monitor treatment.      Studies: No results found.  Scheduled Meds: . [START ON 03/07/2015] amLODipine  10 mg Oral QHS  . calcium acetate  667 mg Oral TID WC  . [START ON 03/09/2015] darbepoetin (ARANESP) injection - DIALYSIS  200 mcg Intravenous Q Mon-HD  . [START ON 03/07/2015] doxercalciferol  0.5 mcg Oral Q T,Th,Sa-HD  . ferric gluconate (FERRLECIT/NULECIT) IV  250 mg Intravenous Q48H  . heparin  5,000 Units Subcutaneous 3 times per day  . hydrALAZINE  50 mg Oral 3 times per day  . insulin aspart  0-9 Units Subcutaneous TID WC  . insulin aspart  2 Units Subcutaneous TID WC  . insulin glargine  25 Units Subcutaneous q morning - 10a  . labetalol  200 mg Oral BID  . levETIRAcetam  500 mg Oral BID  . multivitamin  1 tablet Oral QHS   Continuous Infusions: . sodium chloride      Principal Problem:   Acute on chronic renal failure (HCC) Active Problems:   Seizures ([place])   Hypertension   Hyperkalemia   Hypoglycemia   Anemia   URI (upper respiratory infection)   Acute-on-chronic kidney injury ([place])    Time spent: 30 min    Orlan Aversa, [place] [facility] Pager [phone]. If 7PM-7AM, please contact night-coverage at [email or web address], password [facility] 03/06/2015, 6:19 PM  LOS: 7 days

## 2015-03-06 NOTE — Progress Notes (Signed)
Comfirmation recieved that Edward Colon is scheduled to begin outpatient at [facility] on a Tuesday,Thursdayand Saturday 2nd shift can start January 3 ,2017 need to be there at 10:45 Pending on Nephrology approveal

## 2015-03-07 DIAGNOSIS — N186 End stage renal disease: Secondary | ICD-10-CM

## 2015-03-07 DIAGNOSIS — R739 Hyperglycemia, unspecified: Secondary | ICD-10-CM

## 2015-03-07 DIAGNOSIS — Z992 Dependence on renal dialysis: Secondary | ICD-10-CM

## 2015-03-07 LAB — RENAL FUNCTION PANEL
Albumin: 2.3 g/dL — ABNORMAL LOW (ref 3.5–5.0)
Anion gap: 7 (ref 5–15)
BUN: 25 mg/dL — ABNORMAL HIGH (ref 6–20)
CO2: 25 mmol/L (ref 22–32)
Calcium: 7.7 mg/dL — ABNORMAL LOW (ref 8.9–10.3)
Chloride: 100 mmol/L — ABNORMAL LOW (ref 101–111)
Creatinine, Ser: 6.66 mg/dL — ABNORMAL HIGH (ref 0.61–1.24)
GFR calc Af Amer: 11 mL/min — ABNORMAL LOW (ref >60)
GFR calc non Af Amer: 10 mL/min — ABNORMAL LOW (ref >60)
Glucose, Bld: 380 mg/dL — ABNORMAL HIGH (ref 65–99)
Phosphorus: 5.1 mg/dL — ABNORMAL HIGH (ref 2.5–4.6)
Potassium: 5.9 mmol/L — ABNORMAL HIGH (ref 3.5–5.1)
Sodium: 132 mmol/L — ABNORMAL LOW (ref 135–145)

## 2015-03-07 LAB — GLUCOSE, CAPILLARY
GLUCOSE-CAPILLARY: 216 mg/dL — AB (ref 65–99)
GLUCOSE-CAPILLARY: 288 mg/dL — AB (ref 65–99)

## 2015-03-07 LAB — CBC
HCT: 28.2 % — ABNORMAL LOW (ref 39.0–52.0)
Hemoglobin: 9 g/dL — ABNORMAL LOW (ref 13.0–17.0)
MCH: 26.5 pg (ref 26.0–34.0)
MCHC: 31.9 g/dL (ref 30.0–36.0)
MCV: 83.2 fL (ref 78.0–100.0)
Platelets: 268 10*3/uL (ref 150–400)
RBC: 3.39 MIL/uL — ABNORMAL LOW (ref 4.22–5.81)
RDW: 14.5 % (ref 11.5–15.5)
WBC: 5.9 10*3/uL (ref 4.0–10.5)

## 2015-03-07 MED ORDER — DOXERCALCIFEROL 4 MCG/2ML IV SOLN
INTRAVENOUS | Status: AC
  Administered 2015-03-07: 0.5 ug via INTRAVENOUS
  Filled 2015-03-07: qty 2

## 2015-03-07 MED ORDER — SODIUM CHLORIDE 0.9 % IV SOLN: 100.0000 mL | INTRAVENOUS | Status: DC | PRN

## 2015-03-07 MED ORDER — DOXERCALCIFEROL 4 MCG/2ML IV SOLN
0.5000 ug | INTRAVENOUS | Status: DC
  Administered 2015-03-07: 0.5 ug via INTRAVENOUS

## 2015-03-07 MED ORDER — RENA-VITE PO TABS: 1.0000 | ORAL_TABLET | Freq: Every day | ORAL | Status: DC

## 2015-03-07 MED ORDER — LIDOCAINE-PRILOCAINE 2.5-2.5 % EX CREA
1.0000 "application " | TOPICAL_CREAM | CUTANEOUS | Status: DC | PRN
  Filled 2015-03-07: qty 5

## 2015-03-07 MED ORDER — LIDOCAINE HCL (PF) 1 % IJ SOLN: 5.0000 mL | INTRAMUSCULAR | Status: DC | PRN

## 2015-03-07 MED ORDER — HEPARIN SODIUM (PORCINE) 1000 UNIT/ML DIALYSIS: 20.0000 [IU]/kg | INTRAMUSCULAR | Status: DC | PRN

## 2015-03-07 MED ORDER — CALCIUM ACETATE (PHOS BINDER) 667 MG PO CAPS: 667.0000 mg | ORAL_CAPSULE | Freq: Three times a day (TID) | ORAL | Status: DC

## 2015-03-07 MED ORDER — HEPARIN SODIUM (PORCINE) 1000 UNIT/ML DIALYSIS: 1000.0000 [IU] | INTRAMUSCULAR | Status: DC | PRN

## 2015-03-07 MED ORDER — PENTAFLUOROPROP-TETRAFLUOROETH EX AERO: 1.0000 "application " | INHALATION_SPRAY | CUTANEOUS | Status: DC | PRN

## 2015-03-07 MED ORDER — ALTEPLASE 2 MG IJ SOLR
2.0000 mg | Freq: Once | INTRAMUSCULAR | Status: DC | PRN
  Filled 2015-03-07: qty 2

## 2015-03-07 MED ORDER — INSULIN GLARGINE 100 UNIT/ML ~~LOC~~ SOLN: 20.0000 [IU] | Freq: Every morning | SUBCUTANEOUS | Status: DC

## 2015-03-07 NOTE — Discharge Summary (Signed)
Physician Discharge Summary  CORNELIA TIPPER [id number] DOB: 1981/02/08 DOA: 02/27/2015  PCP: [name], MD  Admit date: 02/27/2015 Discharge date: 03/07/2015  Recommendations for Outpatient Follow-up:  1.  [facility] HD on a Tuesday,Thursdayand Saturday 2nd shift schedule.  Start January 3 ,2017 at 10:45 2.  F/u with vascular surgery in 6 weeks for evaluation of fistula 3.  F/u with PCP for diabetes management in 2-3 weeks   Discharge Diagnoses:  Principal Problem:   ARF (acute renal failure) ([place]) Active Problems:   Seizures ([place])   Hypertension   Hyperkalemia   Hypoglycemia   Anemia   Acute on chronic renal failure (HCC)   URI (upper respiratory infection)   Acute-on-chronic kidney injury ([place])   Hyperglycemia   ESRD on dialysis [facility])   Discharge Condition: stable, improved  Diet recommendation: renal dialysis  Wt Readings from Last 3 Encounters:  03/07/15 85.7 kg (188 lb 15 oz)  01/22/15 91.264 kg (201 lb 3.2 oz)  12/12/14 92.262 kg (203 lb 6.4 oz)    History of present illness:   Edward Colon is a 34 y.o. male with a past medical history significant for epilepsy, T1DM c/b CKD stage IV, renovascular HTN, and anemia of renal disease presented to [facility] for several days of cough, malaise, polyuria. Also had a hypoglycemic episode.   Hospital Course:   ESRD due to T1DM and hypertension, Patient is on transplant list at [facility]. He developed ESRD and was started on hemodialysis.  He had a right arm fistula placed on 12/27 by Dr. Trula Slade and underwent tunneled HD catheter placement by IR for use pending fistula maturation.  He completed first three treatments of dialysis in the hospital and will start hemodialysis on Tuesday at [facility] at 10:45 AM.    HTN: BP was initially high, but trended down at the end of HD at this third treatment.  Continue norvasc and carvedilol.  Lasix was discontinued.   Hyperkalemia, resolved after fluid  hydration.    T1DM, with labile blood sugars in part due to inconsistencies in meal times/NPO status.  Continue lantus 20 units daily at discharge and increase by 5 units if morning blood sugars are greater than 180.  Continue SSI with meals as before.  Advised to eat regular meals.  Anemia of renal failure and mild iron deficiency.  Folate and b12 wnl.  Iron and epo per nephrology.    Seizure d/o, stable, continue levetiracetam  Cough, likely due to volume overload but also URI.  CXR without findings.  Improved.  Consultants:  Vascular  Renal.  Procedures: Right arm fistula HD catheter placement.   Antibiotics:  None  Discharge Exam: Filed Vitals:   03/07/15 0955 03/07/15 1031  BP: 115/75 122/61  Pulse: 86 96  Temp:  97.9 F (36.6 C)  Resp: 14 16   Filed Vitals:   03/07/15 0900 03/07/15 0930 03/07/15 0955 03/07/15 1031  BP: 126/78 105/71 115/75 122/61  Pulse: 86 98 86 96  Temp:    97.9 F (36.6 C)  TempSrc:    Oral  Resp:   14 16  Height:      Weight:   85.7 kg (188 lb 15 oz)   SpO2:   95% 100%     General: Adult male, No acute distress  Cardiovascular: RRR, nl S1, loud S2, 2+ pulses, warm extremities  Respiratory: CTAB, no increased WOB  Abdomen: NABS, soft, NT/ND  MSK: Normal tone and bulk, no LEE, right chest with  HD catheter in place. Right forearm incision c/d/i, no erythema or induration.  Discharge Instructions      Discharge Instructions    Call MD for:  difficulty breathing, headache or visual disturbances    Complete by:  As directed      Call MD for:  extreme fatigue    Complete by:  As directed      Call MD for:  hives    Complete by:  As directed      Call MD for:  persistant dizziness or light-headedness    Complete by:  As directed      Call MD for:  persistant nausea and vomiting    Complete by:  As directed      Call MD for:  redness, tenderness, or signs of infection (pain, swelling, redness, odor or green/yellow  discharge around incision site)    Complete by:  As directed      Call MD for:  severe uncontrolled pain    Complete by:  As directed      Call MD for:  temperature >100.4    Complete by:  As directed      Discharge instructions    Complete by:  As directed   For your diabetes, please continue lantus 20 units daily, however, if your morning blood sugar is consistently more than 180, please increase your lantus to 25 units.  If your morning blood sugar for the next few days is still more than 180, increase to 30 units and schedule an appointment with your primary care doctor to review your blood sugars.  Please stop your lasix.  Take a tab of calcium acetate whenever you eat a meal and start taking a renal vitamin (this is different than a regular multivitamin).     Increase activity slowly    Complete by:  As directed             Medication List    STOP taking these medications        calcitRIOL 0.25 MCG capsule  Commonly known as:  ROCALTROL     furosemide 40 MG tablet  Commonly known as:  LASIX      TAKE these medications        amLODipine 10 MG tablet  Commonly known as:  NORVASC  Take 1 tablet (10 mg total) by mouth daily.     calcium acetate 667 MG capsule  Commonly known as:  PHOSLO  Take 1 capsule (667 mg total) by mouth 3 (three) times daily with meals.     carvedilol 25 MG tablet  Commonly known as:  COREG  Take 25 mg by mouth 2 (two) times daily.     insulin glargine 100 UNIT/ML injection  Commonly known as:  LANTUS  Inject 0.2 mLs (20 Units total) into the skin every morning.     insulin lispro 100 UNIT/ML injection  Commonly known as:  HUMALOG  Inject 2-10 Units into the skin 3 (three) times daily as needed for high blood sugar (high blood sugar).     levETIRAcetam 500 MG tablet  Commonly known as:  KEPPRA  Take 1 tablet (500 mg total) by mouth 2 (two) times daily.     multivitamin Tabs tablet  Take 1 tablet by mouth at bedtime.       Follow-up  Information    Follow up with Annamarie Major, MD In 6 weeks.   Specialties:  Vascular Surgery, Cardiology   Why:  Office will call you to  arrange your appt (sent)   Contact information:   [place] [address] [phone]       [facility]. Go on 03/10/2015.   Why:  10:45 AM   Contact information:   [place], [place] [address]  Ph:  [phone]       Follow up with Nolene Ebbs A, MD. Schedule an appointment as soon as possible for a visit in 2 weeks.   Specialty:  Internal Medicine   Contact information:   [place] [address] [phone]        The results of significant diagnostics from this hospitalization (including imaging, microbiology, ancillary and laboratory) are listed below for reference.    Significant Diagnostic Studies: Dg Chest 2 View  02/27/2015  CLINICAL DATA:  Cough for several days.  Hypertension. EXAM: CHEST  2 VIEW COMPARISON:  October 26, 2013 FINDINGS: Lungs are clear. Heart size and pulmonary vascularity are normal. No adenopathy. No bone lesions. IMPRESSION: No edema or consolidation. Electronically Signed   By: Lowella Grip III M.D.   On: 02/27/2015 08:22   US Renal  02/28/2015  CLINICAL DATA:  Acute renal failure EXAM: RENAL / URINARY TRACT ULTRASOUND COMPLETE COMPARISON:  Report 01/08/2002 no images available FINDINGS: Right Kidney: Length: 12.7 cm in length. No hydronephrosis. Small amount of perinephric fluid in lower pole. There is diffuse mild increased echogenicity suspicious for medical renal disease. No renal calculi. Left Kidney: Length: 11.3 cm. Diffuse increased echogenicity and mild cortical thinning suspicious for medical renal disease. Very mild left hydronephrosis. No renal calculi. Bladder: Thickening of urinary bladder wall up to 9 mm. Cystitis cannot be excluded. IMPRESSION: 1. Bilateral renal increased echogenicity probable due to medical renal  disease. Small amount of perinephric fluid lower pole of the right kidney. Very mild left hydronephrosis. 2. There is thickening of urinary bladder wall up to 9 mm suspicious for cystitis. Electronically Signed   By: Lahoma Crocker M.D.   On: 02/28/2015 17:15   Dg Chest Port 1 View  03/03/2015  CLINICAL DATA:  Status post dialysis catheter insertion EXAM: PORTABLE CHEST 1 VIEW COMPARISON:  02/27/2015 FINDINGS: There is a dual-lumen right-sided central venous catheter with the tip projecting over the cavoatrial junction. There is bilateral interstitial prominence and prominence of the central pulmonary vasculature. There are low lung volumes. There is no pleural effusion or pneumothorax. The heart and mediastinal contours are unremarkable. The osseous structures are unremarkable. IMPRESSION: Dual-lumen right-sided central venous catheter with the tip projecting over the cavoatrial junction. Electronically Signed   By: Kathreen Devoid   On: 03/03/2015 18:25   Dg Fluoro Guide Cv Line-no Report  03/03/2015  CLINICAL DATA:  FLOURO GUIDE CV LINE Fluoroscopy was utilized by the requesting physician.  No radiographic interpretation.    Microbiology: Recent Results (from the past 240 hour(s))  Surgical pcr screen     Status: Abnormal   Collection Time: 03/03/15  6:06 AM  Result Value Ref Range Status   MRSA, PCR NEGATIVE NEGATIVE Final   Staphylococcus aureus POSITIVE (A) NEGATIVE Final    Comment:        The Xpert SA Assay (FDA approved for NASAL specimens in patients over 3 years of age), is one component of a comprehensive surveillance program.  Test performance has been validated by [facility] for patients greater than or equal to 19 year old. It is not intended to diagnose infection nor to guide or monitor treatment.  Labs: Basic Metabolic Panel:  Recent Labs Lab 03/03/15 0550 03/04/15 0410 03/05/15 0712 03/06/15 0730 03/06/15 1832 03/07/15 0642  NA 141 136 142 139 130* 132*   K 4.6 4.9 3.8 4.1 5.0 5.9*  CL 114* 107 110 102 99* 100*  CO2 17* 20* 25 28 23 25   GLUCOSE 157* 423* 67 265* 601* 380*  BUN 55* 52* 38* 23* 22* 25*  CREATININE 8.20* 7.35* 7.14* 6.35* 5.51* 6.66*  CALCIUM 7.1* 7.2* 7.6* 7.6* 7.2* 7.7*  PHOS 5.9* 3.6 5.0* 5.0*  --  5.1*   Liver Function Tests:  Recent Labs Lab 03/03/15 0550 03/04/15 0410 03/05/15 0712 03/06/15 0730 03/07/15 0642  ALBUMIN 2.2* 2.2* 2.3* 2.2* 2.3*   No results for input(s): LIPASE, AMYLASE in the last 168 hours. No results for input(s): AMMONIA in the last 168 hours. CBC:  Recent Labs Lab 03/02/15 0503 03/03/15 1015 03/04/15 0411 03/05/15 0700 03/07/15 0642  WBC 3.4* 3.4* 4.7 4.5 5.9  HGB 7.8* 8.2* 7.9* 8.4* 9.0*  HCT 23.3* 24.4* 24.4* 26.1* 28.2*  MCV 80.6 81.3 81.9 81.8 83.2  PLT 240 256 251 311 268   Cardiac Enzymes: No results for input(s): CKTOTAL, CKMB, CKMBINDEX, TROPONINI in the last 168 hours. BNP: BNP (last 3 results) No results for input(s): BNP in the last 8760 hours.  ProBNP (last 3 results) No results for input(s): PROBNP in the last 8760 hours.  CBG:  Recent Labs Lab 03/06/15 2050 03/06/15 2153 03/06/15 2252 03/07/15 1030 03/07/15 1203  GLUCAP 149* 44* 104* 216* 288*    Time coordinating discharge: 35 minutes  Signed:  Aliyanna Wassmer  [facility] 03/07/2015, 2:48 PM

## 2015-03-07 NOTE — Progress Notes (Signed)
Hemodialysis. Pt tolerated well without issue. Given handout on AVF care/maturation. Reinforced catheter care. Report called to primary RN.

## 2015-03-07 NOTE — Progress Notes (Signed)
Pt. Had a critical lab of glucose 601. MD notified and he ordered 15 units of insulin. Insulin was given to pt.Blood sugar rechecked in 1 hour and was 149. Pt. Reported feeling funny few minutes later and blood sugar was checked. Pt. Blood sugar was 44. Pt. ate 100% of his dinner afterwards and his blood sugar was rechecked in 1 hour. Pt. Blood sugar was 104. MD notified.

## 2015-03-07 NOTE — Progress Notes (Signed)
[facility] Progress Note  Assessment/Plan: 1. New ESRD - OK for discharge. Is to start at [facility] with first HD Jan 3rd, arrive at 10:45 am.  AVF placed 12/27- cannot hear a bruit or feel a thrill- if AVF doesn't fly - can refer back to [facility] next week and he can get a new access as an outpt (note from Dr. Venida Jarvis states it is dopplerable) to f/u with [facility] in 4-6 weeks 2. Anemia - Hgb 9 stable on max ESA- last dose 12/25 - next dose  Next week;- repleting Fe s/p 3 of 4 250 doses IV Fe 3. Secondary hyperparathyroidism - on daily calcitriol; have changed  to 0.5 IV hectorol TTS (his center doesn't use calcitriol and he should stop his daily calcitriol) check iPTH with Jan labs; P 5.1 - started phoslo 1 ac 4. BP/ volume - serial HD lowering volume 2 L Wed, 2.2 Thurs and 2.8 Friday on norvasc 10, labetolol 200 bid and hydralazine 50 tid- changed norvasc to q HS - already got dose today 5. Nutrition - renal carb mod/add renavite 6. Disp - anticipate d/c today post HD and then start at [facility] on Tuesday 7. DM - BS labile - checks sugars at home.  Myriam Jacobson, PA-C [facility] Beeper [phone] 03/07/2015,9:25 AM  LOS: 8 days   Pt seen, examined and agree w A/P as above.  Kelly Splinter MD [facility] pager [phone]    cell [phone] 03/07/2015, 1:19 PM   Subjective:   No c/o  Objective Filed Vitals:   03/07/15 0747 03/07/15 0800 03/07/15 0830 03/07/15 0900  BP: 123/78 123/82 126/78 126/78  Pulse: 82 83 85 86  Temp:      TempSrc:      Resp:      Height:      Weight:      SpO2:       Physical Exam General:NAD on HD Heart: RRR Lungs: no rales Abdomen: soft NT Extremities: no edema Dialysis Access: right IJ and right lower AVF- no palpable thrill or overt bruit  Dialysis Orders: new start  Additional Objective Labs: Basic Metabolic Panel:  Recent Labs Lab 03/05/15 0712 03/06/15 0730 03/06/15 1832 03/07/15 0642   NA 142 139 130* 132*  K 3.8 4.1 5.0 5.9*  CL 110 102 99* 100*  CO2 25 28 23 25   GLUCOSE 67 265* 601* 380*  BUN 38* 23* 22* 25*  CREATININE 7.14* 6.35* 5.51* 6.66*  CALCIUM 7.6* 7.6* 7.2* 7.7*  PHOS 5.0* 5.0*  --  5.1*   Liver Function Tests:  Recent Labs Lab 03/05/15 0712 03/06/15 0730 03/07/15 0642  ALBUMIN 2.3* 2.2* 2.3*    CBC:  Recent Labs Lab 03/02/15 0503 03/03/15 1015 03/04/15 0411 03/05/15 0700 03/07/15 0642  WBC 3.4* 3.4* 4.7 4.5 5.9  HGB 7.8* 8.2* 7.9* 8.4* 9.0*  HCT 23.3* 24.4* 24.4* 26.1* 28.2*  MCV 80.6 81.3 81.9 81.8 83.2  PLT 240 256 251 311 268  CBG:  Recent Labs Lab 03/06/15 1139 03/06/15 1720 03/06/15 2050 03/06/15 2153 03/06/15 2252  GLUCAP 231* >600* 149* 44* 104*   Medications: . sodium chloride     . amLODipine  10 mg Oral QHS  . calcium acetate  667 mg Oral TID WC  . [START ON 03/09/2015] darbepoetin (ARANESP) injection - DIALYSIS  200 mcg Intravenous Q Mon-HD  . doxercalciferol  0.5 mcg Oral Q T,Th,Sa-HD  . ferric gluconate (FERRLECIT/NULECIT) IV  250 mg Intravenous Q48H  .  heparin  5,000 Units Subcutaneous 3 times per day  . hydrALAZINE  50 mg Oral 3 times per day  . insulin aspart  0-5 Units Subcutaneous QHS  . insulin aspart  0-9 Units Subcutaneous TID WC  . insulin aspart  2 Units Subcutaneous TID WC  . insulin glargine  25 Units Subcutaneous q morning - 10a  . labetalol  200 mg Oral BID  . levETIRAcetam  500 mg Oral BID  . multivitamin  1 tablet Oral QHS

## 2015-03-10 DIAGNOSIS — N186 End stage renal disease: Secondary | ICD-10-CM | POA: Diagnosis not present

## 2015-03-10 DIAGNOSIS — D508 Other iron deficiency anemias: Secondary | ICD-10-CM | POA: Diagnosis not present

## 2015-03-10 DIAGNOSIS — D631 Anemia in chronic kidney disease: Secondary | ICD-10-CM | POA: Diagnosis not present

## 2015-03-12 DIAGNOSIS — D508 Other iron deficiency anemias: Secondary | ICD-10-CM | POA: Diagnosis not present

## 2015-03-12 DIAGNOSIS — N186 End stage renal disease: Secondary | ICD-10-CM | POA: Diagnosis not present

## 2015-03-12 DIAGNOSIS — D631 Anemia in chronic kidney disease: Secondary | ICD-10-CM | POA: Diagnosis not present

## 2015-03-14 DIAGNOSIS — D508 Other iron deficiency anemias: Secondary | ICD-10-CM | POA: Diagnosis not present

## 2015-03-14 DIAGNOSIS — N186 End stage renal disease: Secondary | ICD-10-CM | POA: Diagnosis not present

## 2015-03-14 DIAGNOSIS — D631 Anemia in chronic kidney disease: Secondary | ICD-10-CM | POA: Diagnosis not present

## 2015-03-17 DIAGNOSIS — N186 End stage renal disease: Secondary | ICD-10-CM | POA: Diagnosis not present

## 2015-03-17 DIAGNOSIS — D508 Other iron deficiency anemias: Secondary | ICD-10-CM | POA: Diagnosis not present

## 2015-03-17 DIAGNOSIS — D631 Anemia in chronic kidney disease: Secondary | ICD-10-CM | POA: Diagnosis not present

## 2015-03-19 ENCOUNTER — Telehealth: Payer: Self-pay

## 2015-03-19 DIAGNOSIS — D508 Other iron deficiency anemias: Secondary | ICD-10-CM | POA: Diagnosis not present

## 2015-03-19 DIAGNOSIS — N186 End stage renal disease: Secondary | ICD-10-CM | POA: Diagnosis not present

## 2015-03-19 DIAGNOSIS — D631 Anemia in chronic kidney disease: Secondary | ICD-10-CM | POA: Diagnosis not present

## 2015-03-19 NOTE — Telephone Encounter (Signed)
phone call from nurse at [facility].  Reported pt's right R-C AVF has clotted.  Pt. is 3 weeks post-op.  Checked with Dr. Oneida Alar in office.  Recommended to schedule office appt. for reevaluation of new permanent access placement.  Will contact pt. with appt.

## 2015-03-20 ENCOUNTER — Encounter: Payer: Self-pay | Admitting: Surgery

## 2015-03-20 NOTE — Telephone Encounter (Signed)
Spoke with [employer]

## 2015-03-21 DIAGNOSIS — D508 Other iron deficiency anemias: Secondary | ICD-10-CM | POA: Diagnosis not present

## 2015-03-21 DIAGNOSIS — N186 End stage renal disease: Secondary | ICD-10-CM | POA: Diagnosis not present

## 2015-03-21 DIAGNOSIS — D631 Anemia in chronic kidney disease: Secondary | ICD-10-CM | POA: Diagnosis not present

## 2015-03-24 DIAGNOSIS — D508 Other iron deficiency anemias: Secondary | ICD-10-CM | POA: Diagnosis not present

## 2015-03-24 DIAGNOSIS — D631 Anemia in chronic kidney disease: Secondary | ICD-10-CM | POA: Diagnosis not present

## 2015-03-24 DIAGNOSIS — N186 End stage renal disease: Secondary | ICD-10-CM | POA: Diagnosis not present

## 2015-03-26 DIAGNOSIS — D631 Anemia in chronic kidney disease: Secondary | ICD-10-CM | POA: Diagnosis not present

## 2015-03-26 DIAGNOSIS — N186 End stage renal disease: Secondary | ICD-10-CM | POA: Diagnosis not present

## 2015-03-26 DIAGNOSIS — D508 Other iron deficiency anemias: Secondary | ICD-10-CM | POA: Diagnosis not present

## 2015-03-28 DIAGNOSIS — N186 End stage renal disease: Secondary | ICD-10-CM | POA: Diagnosis not present

## 2015-03-28 DIAGNOSIS — D631 Anemia in chronic kidney disease: Secondary | ICD-10-CM | POA: Diagnosis not present

## 2015-03-28 DIAGNOSIS — D508 Other iron deficiency anemias: Secondary | ICD-10-CM | POA: Diagnosis not present

## 2015-03-30 ENCOUNTER — Encounter: Payer: Self-pay | Admitting: Surgery

## 2015-03-30 ENCOUNTER — Other Ambulatory Visit: Payer: Self-pay

## 2015-03-30 ENCOUNTER — Ambulatory Visit (INDEPENDENT_AMBULATORY_CARE_PROVIDER_SITE_OTHER): Payer: Medicare Other | Admitting: Surgery

## 2015-03-30 ENCOUNTER — Ambulatory Visit (HOSPITAL_COMMUNITY)
Admission: RE | Admit: 2015-03-30 | Discharge: 2015-03-30 | Disposition: A | Discharge status: Home / Self Care | Payer: Medicare Other | Source: Ambulatory Visit | Attending: Surgery | Admitting: Surgery

## 2015-03-30 ENCOUNTER — Ambulatory Visit (INDEPENDENT_AMBULATORY_CARE_PROVIDER_SITE_OTHER)
Admission: RE | Admit: 2015-03-30 | Discharge: 2015-03-30 | Disposition: A | Discharge status: Home / Self Care | Payer: Medicare Other | Source: Ambulatory Visit | Attending: Surgery | Admitting: Surgery

## 2015-03-30 VITALS — BP 154/94 | HR 76 | Ht 71.0 in | Wt 196.7 lb

## 2015-03-30 DIAGNOSIS — E1022 Type 1 diabetes mellitus with diabetic chronic kidney disease: Secondary | ICD-10-CM | POA: Insufficient documentation

## 2015-03-30 DIAGNOSIS — N189 Chronic kidney disease, unspecified: Secondary | ICD-10-CM | POA: Diagnosis not present

## 2015-03-30 DIAGNOSIS — I129 Hypertensive chronic kidney disease with stage 1 through stage 4 chronic kidney disease, or unspecified chronic kidney disease: Secondary | ICD-10-CM | POA: Insufficient documentation

## 2015-03-30 DIAGNOSIS — R938 Abnormal findings on diagnostic imaging of other specified body structures: Secondary | ICD-10-CM | POA: Diagnosis not present

## 2015-03-30 DIAGNOSIS — Z4931 Encounter for adequacy testing for hemodialysis: Secondary | ICD-10-CM | POA: Diagnosis not present

## 2015-03-30 DIAGNOSIS — N186 End stage renal disease: Secondary | ICD-10-CM

## 2015-03-30 NOTE — Progress Notes (Signed)
Patient name: Edward Colon MRN: [id number] DOB: 1980/09/21 Sex: male     Chief Complaint  Patient presents with  . Re-evaluation    4-6 wk f/u s/p R RC AVF  - HD TTS via cath    HISTORY OF PRESENT ILLNESS:  the patient is back for follow-up.  He is status post right radiocephalic fistula placement and dialysis catheter. He is currently on dialysis Tuesday Thursday Saturday.  He is right-handed.  Past Medical History  Diagnosis Date  . Diabetes mellitus without complication ([place])   . Hypertension   . Seizures ([place])   . Chronic kidney disease   . Type 1 diabetes [facility])     Past Surgical History  Procedure Laterality Date  . Insertion of dialysis catheter Right 03/03/2015    Procedure: INSERTION OF DIALYSIS CATHETER;  Surgeon: Serafina Mitchell, MD;  Location: [place];  Service: Vascular;  Laterality: Right;  . Av fistula placement Right 03/03/2015    Procedure: RIGHT RADIO-CEPHALIC ARTERIOVENOUS (AV) FISTULA CREATION;  Surgeon: Serafina Mitchell, MD;  [facility];  Service: Vascular;  Laterality: Right;    Social History   Social History  . Marital Status: Single    Spouse Name: N/A  . Number of Children: N/A  . Years of Education: N/A   Occupational History  . Not on file.   Social History Main Topics  . Smoking status: Never Smoker   . Smokeless tobacco: Never Used  . Alcohol Use: No  . Drug Use: No  . Sexual Activity: No   Other Topics Concern  . Not on file   Social History Narrative    Family History  Problem Relation Age of Onset  . Hypertension Mother   . Hypertension Father     Allergies as of 03/30/2015  . (No Known Allergies)    Current Outpatient Prescriptions on File Prior to Visit  Medication Sig Dispense Refill  . amLODipine (NORVASC) 10 MG tablet Take 1 tablet (10 mg total) by mouth daily. 90 tablet 3  . calcium acetate (PHOSLO) 667 MG capsule Take 1 capsule (667 mg total) by mouth 3 (three) times daily with meals. 90 capsule 0    . carvedilol (COREG) 25 MG tablet Take 25 mg by mouth 2 (two) times daily.  5  . insulin glargine (LANTUS) 100 UNIT/ML injection Inject 0.2 mLs (20 Units total) into the skin every morning. 10 mL 0  . insulin lispro (HUMALOG) 100 UNIT/ML injection Inject 2-10 Units into the skin 3 (three) times daily as needed for high blood sugar (high blood sugar).     . multivitamin (RENA-VIT) TABS tablet Take 1 tablet by mouth at bedtime. 30 tablet 0  . levETIRAcetam (KEPPRA) 500 MG tablet Take 1 tablet (500 mg total) by mouth 2 (two) times daily. (Patient not taking: Reported on 03/30/2015) 60 tablet 1   No current facility-administered medications on file prior to visit.       PHYSICAL EXAMINATION:   Vital signs are  Filed Vitals:   03/30/15 1336 03/30/15 1338  BP: 154/92 154/94  Pulse: 76   Height: 5\' 11"  (1.803 m)   Weight: 196 lb 11.2 oz (89.223 kg)   SpO2: 99%    Body mass index is 27.45 kg/(m^2). General: The patient appears their stated age.  no thrill within right radiocephalic fistula  Diagnostic Studies  I have reviewed his ultrasound.  His right radiocephalic fistula has occluded. I had him vein mapped again. The left cephalic  vein is too small.  The left and right basilic vein appeared to be marginal , ranging from 0.34-0.28 cm on the left.  Assessment:  end-stage renal disease Plan:  I discussed that his fistula has occluded and that we need to establish new access. Because he is right-handed, I have recommended a left arm procedure his vein mapping shows that his  Basilic veins are adequate.  We discussed proceeding with a left first stage basilic vein transposition.  I'm going to try to get this scheduled as soon as possible.  He understands that one of my partners may be doing the procedure in order to get him taken care of sooner.  Eldridge Abrahams, M.D. [facility] [place] Office: [phone] Pager:  [phone]

## 2015-03-31 ENCOUNTER — Encounter (HOSPITAL_COMMUNITY): Payer: Self-pay | Admitting: *Deleted

## 2015-03-31 DIAGNOSIS — N186 End stage renal disease: Secondary | ICD-10-CM | POA: Diagnosis not present

## 2015-03-31 DIAGNOSIS — D508 Other iron deficiency anemias: Secondary | ICD-10-CM | POA: Diagnosis not present

## 2015-03-31 DIAGNOSIS — D631 Anemia in chronic kidney disease: Secondary | ICD-10-CM | POA: Diagnosis not present

## 2015-03-31 MED ORDER — SODIUM CHLORIDE 0.9 % IV SOLN
INTRAVENOUS | Status: DC
  Administered 2015-04-01 (×2): via INTRAVENOUS

## 2015-03-31 MED ORDER — DEXTROSE 5 % IV SOLN
1.5000 g | INTRAVENOUS | Status: AC
  Administered 2015-04-01: 1.5 g via INTRAVENOUS
  Filled 2015-03-31: qty 1.5

## 2015-03-31 MED ORDER — CHLORHEXIDINE GLUCONATE CLOTH 2 % EX PADS: 6.0000 | MEDICATED_PAD | Freq: Once | CUTANEOUS | Status: DC

## 2015-03-31 NOTE — Progress Notes (Signed)
Pt states he's been told he has a heart murmur, but has never had any problems.  Pt is diabetic, type 1. States his fasting blood sugar run between 89-240. Instructed pt to take 16 units of Lantus Insulin in AM (80%) and if blood sugar is greater than 220, he may use his Novolog insulin, take 1/2 of regular correction dose. Also instructed pt that if blood sugar is 70 of less, to treat with 4 oz of clear juice (Apple or cranberry) and to recheck blood sugar fifteen minutes later. If blood sugar is still 70 or below, call the Surgical Short Stay and ask to speak with a nurse. These instructions given per Diabetes Medication Adjustment Guidelines Prior to Procedure and Surgery.

## 2015-04-01 ENCOUNTER — Ambulatory Visit (HOSPITAL_COMMUNITY): Payer: Medicare Other | Admitting: Certified Registered Nurse Anesthetist

## 2015-04-01 ENCOUNTER — Encounter (HOSPITAL_COMMUNITY): Payer: Self-pay | Admitting: Certified Registered Nurse Anesthetist

## 2015-04-01 ENCOUNTER — Encounter (HOSPITAL_COMMUNITY)
Admission: RE | Disposition: A | Discharge status: Home / Self Care | Payer: Self-pay | Source: Ambulatory Visit | Attending: Vascular Surgery

## 2015-04-01 ENCOUNTER — Ambulatory Visit (HOSPITAL_COMMUNITY)
Admission: RE | Admit: 2015-04-01 | Discharge: 2015-04-01 | Disposition: A | Discharge status: Home / Self Care | Payer: Medicare Other | Source: Ambulatory Visit | Attending: Vascular Surgery | Admitting: Vascular Surgery

## 2015-04-01 DIAGNOSIS — N189 Chronic kidney disease, unspecified: Secondary | ICD-10-CM

## 2015-04-01 DIAGNOSIS — Z79899 Other long term (current) drug therapy: Secondary | ICD-10-CM | POA: Insufficient documentation

## 2015-04-01 DIAGNOSIS — E1022 Type 1 diabetes mellitus with diabetic chronic kidney disease: Secondary | ICD-10-CM | POA: Diagnosis not present

## 2015-04-01 DIAGNOSIS — N185 Chronic kidney disease, stage 5: Secondary | ICD-10-CM | POA: Diagnosis not present

## 2015-04-01 DIAGNOSIS — Z992 Dependence on renal dialysis: Secondary | ICD-10-CM | POA: Diagnosis not present

## 2015-04-01 DIAGNOSIS — N186 End stage renal disease: Secondary | ICD-10-CM | POA: Insufficient documentation

## 2015-04-01 DIAGNOSIS — I120 Hypertensive chronic kidney disease with stage 5 chronic kidney disease or end stage renal disease: Secondary | ICD-10-CM | POA: Insufficient documentation

## 2015-04-01 DIAGNOSIS — R569 Unspecified convulsions: Secondary | ICD-10-CM | POA: Insufficient documentation

## 2015-04-01 DIAGNOSIS — N179 Acute kidney failure, unspecified: Secondary | ICD-10-CM

## 2015-04-01 DIAGNOSIS — Z794 Long term (current) use of insulin: Secondary | ICD-10-CM | POA: Diagnosis not present

## 2015-04-01 HISTORY — DX: Cardiac murmur, unspecified: R01.1

## 2015-04-01 HISTORY — DX: Anemia, unspecified: D64.9

## 2015-04-01 HISTORY — PX: BASCILIC VEIN TRANSPOSITION: SHX5742

## 2015-04-01 LAB — GLUCOSE, CAPILLARY
GLUCOSE-CAPILLARY: 271 mg/dL — AB (ref 65–99)
GLUCOSE-CAPILLARY: 156 mg/dL — AB (ref 65–99)
GLUCOSE-CAPILLARY: 27 mg/dL — AB (ref 65–99)
Glucose-Capillary: 389 mg/dL — ABNORMAL HIGH (ref 65–99)
Glucose-Capillary: 313 mg/dL — ABNORMAL HIGH (ref 65–99)
Glucose-Capillary: 104 mg/dL — ABNORMAL HIGH (ref 65–99)
Glucose-Capillary: 60 mg/dL — ABNORMAL LOW (ref 65–99)
Glucose-Capillary: 84 mg/dL (ref 65–99)

## 2015-04-01 LAB — POCT I-STAT 4, (NA,K, GLUC, HGB,HCT)
Glucose, Bld: 366 mg/dL — ABNORMAL HIGH (ref 65–99)
HEMATOCRIT: 40 % (ref 39.0–52.0)
HEMOGLOBIN: 13.6 g/dL (ref 13.0–17.0)
Potassium: 4.7 mmol/L (ref 3.5–5.1)
Sodium: 135 mmol/L (ref 135–145)

## 2015-04-01 SURGERY — TRANSPOSITION, VEIN, BASILIC
Anesthesia: General | Site: Arm Upper | Laterality: Left

## 2015-04-01 MED ORDER — PROPOFOL 10 MG/ML IV BOLUS
INTRAVENOUS | Status: AC
  Filled 2015-04-01: qty 20

## 2015-04-01 MED ORDER — ONDANSETRON HCL 4 MG/2ML IJ SOLN
INTRAMUSCULAR | Status: AC
  Filled 2015-04-01: qty 2

## 2015-04-01 MED ORDER — FENTANYL CITRATE (PF) 250 MCG/5ML IJ SOLN
INTRAMUSCULAR | Status: AC
  Filled 2015-04-01: qty 5

## 2015-04-01 MED ORDER — FENTANYL CITRATE (PF) 100 MCG/2ML IJ SOLN: 25.0000 ug | INTRAMUSCULAR | Status: DC | PRN

## 2015-04-01 MED ORDER — EPHEDRINE SULFATE 50 MG/ML IJ SOLN
INTRAMUSCULAR | Status: AC
  Filled 2015-04-01: qty 1

## 2015-04-01 MED ORDER — HEPARIN SODIUM (PORCINE) 1000 UNIT/ML IJ SOLN
1.7000 mL | Freq: Once | INTRAMUSCULAR | Status: AC
  Administered 2015-04-01: 1700 [IU] via INTRAVENOUS

## 2015-04-01 MED ORDER — LIDOCAINE HCL (CARDIAC) 20 MG/ML IV SOLN
INTRAVENOUS | Status: AC
  Filled 2015-04-01: qty 5

## 2015-04-01 MED ORDER — INSULIN ASPART 100 UNIT/ML ~~LOC~~ SOLN
15.0000 [IU] | Freq: Once | SUBCUTANEOUS | Status: AC
  Administered 2015-04-01: 15 [IU] via SUBCUTANEOUS

## 2015-04-01 MED ORDER — 0.9 % SODIUM CHLORIDE (POUR BTL) OPTIME
TOPICAL | Status: DC | PRN
Start: 2015-04-01 — End: 2015-04-01
  Administered 2015-04-01: 1000 mL

## 2015-04-01 MED ORDER — SODIUM CHLORIDE 0.9 % IV SOLN
INTRAVENOUS | Status: DC | PRN
  Administered 2015-04-01: 12:00:00

## 2015-04-01 MED ORDER — ONDANSETRON HCL 4 MG/2ML IJ SOLN
INTRAMUSCULAR | Status: DC | PRN
  Administered 2015-04-01: 4 mg via INTRAVENOUS

## 2015-04-01 MED ORDER — FENTANYL CITRATE (PF) 250 MCG/5ML IJ SOLN
INTRAMUSCULAR | Status: DC | PRN
  Administered 2015-04-01 (×2): 50 ug via INTRAVENOUS
  Administered 2015-04-01 (×2): 25 ug via INTRAVENOUS

## 2015-04-01 MED ORDER — INSULIN ASPART 100 UNIT/ML ~~LOC~~ SOLN
SUBCUTANEOUS | Status: AC
  Filled 2015-04-01: qty 1

## 2015-04-01 MED ORDER — PROMETHAZINE HCL 25 MG/ML IJ SOLN: 6.2500 mg | INTRAMUSCULAR | Status: DC | PRN

## 2015-04-01 MED ORDER — LIDOCAINE HCL (PF) 1 % IJ SOLN
INTRAMUSCULAR | Status: AC
  Filled 2015-04-01: qty 30

## 2015-04-01 MED ORDER — PROPOFOL 10 MG/ML IV BOLUS
INTRAVENOUS | Status: DC | PRN
  Administered 2015-04-01: 200 mg via INTRAVENOUS

## 2015-04-01 MED ORDER — HEPARIN SODIUM (PORCINE) 1000 UNIT/ML IJ SOLN
INTRAMUSCULAR | Status: DC | PRN
  Administered 2015-04-01: 5000 [IU] via INTRAVENOUS

## 2015-04-01 MED ORDER — PHENYLEPHRINE HCL 10 MG/ML IJ SOLN
INTRAMUSCULAR | Status: DC | PRN
  Administered 2015-04-01: 40 ug via INTRAVENOUS
  Administered 2015-04-01 (×5): 80 ug via INTRAVENOUS
  Administered 2015-04-01: 40 ug via INTRAVENOUS
  Administered 2015-04-01: 80 ug via INTRAVENOUS

## 2015-04-01 MED ORDER — LIDOCAINE HCL (PF) 1 % IJ SOLN: INTRAMUSCULAR | Status: DC | PRN

## 2015-04-01 MED ORDER — OXYCODONE HCL 5 MG PO TABS: 5.0000 mg | ORAL_TABLET | Freq: Four times a day (QID) | ORAL | Status: DC | PRN

## 2015-04-01 MED ORDER — DEXTROSE 50 % IV SOLN
INTRAVENOUS | Status: AC
  Administered 2015-04-01: 12.5 g via INTRAVENOUS
  Filled 2015-04-01: qty 50

## 2015-04-01 MED ORDER — LIDOCAINE HCL (CARDIAC) 20 MG/ML IV SOLN
INTRAVENOUS | Status: DC | PRN
  Administered 2015-04-01: 80 mg via INTRAVENOUS

## 2015-04-01 MED ORDER — INSULIN ASPART 100 UNIT/ML ~~LOC~~ SOLN: 20.0000 [IU] | Freq: Once | SUBCUTANEOUS | Status: DC

## 2015-04-01 SURGICAL SUPPLY — 30 items
CANISTER SUCTION 2500CC (MISCELLANEOUS) ×2 IMPLANT
CANNULA VESSEL 3MM 2 BLNT TIP (CANNULA) IMPLANT
CLIP TI MEDIUM 24 (CLIP) ×2 IMPLANT
CLIP TI WIDE RED SMALL 24 (CLIP) ×2 IMPLANT
COVER PROBE W GEL 5X96 (DRAPES) ×2 IMPLANT
DECANTER SPIKE VIAL GLASS SM (MISCELLANEOUS) ×2 IMPLANT
ELECT REM PT RETURN 9FT ADLT (ELECTROSURGICAL) ×2
ELECTRODE REM PT RTRN 9FT ADLT (ELECTROSURGICAL) ×1 IMPLANT
GEL ULTRASOUND 20GR AQUASONIC (MISCELLANEOUS) IMPLANT
GLOVE BIO SURGEON STRL SZ7.5 (GLOVE) ×2 IMPLANT
GLOVE BIOGEL PI IND STRL 7.0 (GLOVE) IMPLANT
GLOVE BIOGEL PI INDICATOR 7.0 (GLOVE) ×1
GLOVE ECLIPSE 6.5 STRL STRAW (GLOVE) ×1 IMPLANT
GOWN STRL REUS W/ TWL LRG LVL3 (GOWN DISPOSABLE) ×3 IMPLANT
GOWN STRL REUS W/TWL LRG LVL3 (GOWN DISPOSABLE) ×6
KIT BASIN OR (CUSTOM PROCEDURE TRAY) ×2 IMPLANT
KIT ROOM TURNOVER OR (KITS) ×2 IMPLANT
LIQUID BAND (GAUZE/BANDAGES/DRESSINGS) ×2 IMPLANT
LOOP VESSEL MINI RED (MISCELLANEOUS) IMPLANT
NS IRRIG 1000ML POUR BTL (IV SOLUTION) ×2 IMPLANT
PACK CV ACCESS (CUSTOM PROCEDURE TRAY) ×2 IMPLANT
PAD ARMBOARD 7.5X6 YLW CONV (MISCELLANEOUS) ×4 IMPLANT
SPONGE SURGIFOAM ABS GEL 100 (HEMOSTASIS) IMPLANT
SUT PROLENE 7 0 BV 1 (SUTURE) ×2 IMPLANT
SUT SILK 2 0 SH (SUTURE) ×2 IMPLANT
SUT VIC AB 3-0 SH 27 (SUTURE) ×4
SUT VIC AB 3-0 SH 27X BRD (SUTURE) ×1 IMPLANT
SUT VICRYL 4-0 PS2 18IN ABS (SUTURE) ×3 IMPLANT
UNDERPAD 30X30 INCONTINENT (UNDERPADS AND DIAPERS) ×2 IMPLANT
WATER STERILE IRR 1000ML POUR (IV SOLUTION) ×2 IMPLANT

## 2015-04-01 NOTE — Discharge Instructions (Signed)
° ° °  04/01/2015 DEAMONTE GESKE [id number] 1980/08/16  Surgeon(s): Elam Dutch, MD  Procedure(s): BASCILIC VEIN TRANSPOSITION-LEFT ARM- FIRST STAGE  x Do not stick fistula for 12 weeks

## 2015-04-01 NOTE — Anesthesia Preprocedure Evaluation (Addendum)
Anesthesia Evaluation  Patient identified by MRN, date of birth, ID band Patient awake    Reviewed: Allergy & Precautions, NPO status , Patient's Chart, lab work & pertinent test results  Airway Mallampati: II  TM Distance: >3 FB Neck ROM: Full    Dental no notable dental hx.    Pulmonary neg pulmonary ROS,    Pulmonary exam normal breath sounds clear to auscultation       Cardiovascular hypertension, Normal cardiovascular exam Rhythm:Regular Rate:Normal     Neuro/Psych negative neurological ROS  negative psych ROS   GI/Hepatic negative GI ROS, Neg liver ROS,   Endo/Other  diabetes, Poorly Controlled  Renal/GU ESRFRenal disease  negative genitourinary   Musculoskeletal negative musculoskeletal ROS (+)   Abdominal   Peds negative pediatric ROS (+)  Hematology negative hematology ROS (+)   Anesthesia Other Findings   Reproductive/Obstetrics negative OB ROS                            Anesthesia Physical Anesthesia Plan  ASA: III  Anesthesia Plan: General   Post-op Pain Management:    Induction: Intravenous  Airway Management Planned: LMA  Additional Equipment:   Intra-op Plan:   Post-operative Plan: Extubation in OR  Informed Consent: I have reviewed the patients History and Physical, chart, labs and discussed the procedure including the risks, benefits and alternatives for the proposed anesthesia with the patient or authorized representative who has indicated his/her understanding and acceptance.   Dental advisory given  Plan Discussed with: CRNA and Surgeon  Anesthesia Plan Comments:         Anesthesia Quick Evaluation

## 2015-04-01 NOTE — Progress Notes (Signed)
Unsuccessful 2 attempts to gain IV access by Remo Lipps. Spoke to Dr. Kalman Shan, stated it was okay to use diatek catheter. Accessed blue port. NS infusing.

## 2015-04-01 NOTE — Op Note (Signed)
Procedure: Left basilic vein transposition fistula first stage, Ultrasound guidance  Preoperative diagnosis: End-stage renal disease  Postoperative diagnosis: Same  Anesthesia: Gen.  Assistant: Leontine Locket, PAC, Gaye Alken RNFA  Operative findings: 3 mm left basilic vein, 3 mm left brachial artery  Operative details: After obtaining informed consent, the patient was taken to the operating room. The patient was placed in supine position operating table. After induction of general anesthesia, the patient's entire left upper extremity was prepped and draped in the usual sterile fashion. Next ultrasound was used to identify the left basilic vein. A longitudinal incision was made just above the antecubital crease in order to expose the basilic vein. The vein was of good quality approximately 3 mm in diameter.  Care was taken to try to not injure any sensory nerves and all the motor nerves were identified and protected. The vein was dissected free circumferentially and small side branches ligated and divided between silk ties or clips.  It was fairly small but did accept a 3 mm dilator. Next the brachial artery was exposed by deepening the basilic vein harvest incision just above the antecubital crease. The artery was approximately 3 mm in diameter with some spasm. This was dissected free circumferentially. Vessel loops were placed around it. There was some spasm within the artery after dissecting it free. Next the distal basilic vein was ligated with a 2 silk tie and the vein transected.  The patient was given 5000 units of intravenous heparin. Vessel loops were used to control the artery proximally and distally. A longitudinal arteriotomy was made with an 11 blade.  The vein was cut to length and sewn end of vein to side of artery using a running 7-0 Prolene suture. Just prior to completion of the anastomosis, it was forebled backbled and thoroughly flushed. The anastomosis was secured and the  Vesseloops were released.  There was good doppler flow in the proximal aspect of the fistula. There was also good Doppler flow throughout the course of the fistula. At this point all subcutaneous tissues were reapproximated using running 3-0 Vicryl suture. All skin incisions were closed with running 4  0 Vicryl subcuticular stitch. Dermabond was applied to all incisions. The patient tolerated the procedure well and there were no complications. Instrument sponge needle counts were correct at the end of the case. The patient was taken to the recovery room in stable condition.  Ruta Hinds, MD [facility] [place] Office: [phone] Pager: [phone]

## 2015-04-01 NOTE — Transfer of Care (Signed)
Immediate Anesthesia Transfer of Care Note  Patient: Edward Colon  Procedure(s) Performed: Procedure(s): BASCILIC VEIN TRANSPOSITION-LEFT ARM- FIRST STAGE (Left)  Patient Location: PACU  Anesthesia Type:General  Level of Consciousness: lethargic  Airway & Oxygen Therapy: Patient Spontanous Breathing and Patient connected to nasal cannula oxygen  Post-op Assessment: Report given to RN and Post -op Vital signs reviewed and stable  Post vital signs: stable  Last Vitals:  Filed Vitals:   04/01/15 0903  BP: 122/65  Pulse: 86  Temp: 36.5 C  Resp: 20    Complications: No apparent anesthesia complications

## 2015-04-01 NOTE — Interval H&P Note (Signed)
History and Physical Interval Note:  04/01/2015 11:22 AM  Edward Colon  has presented today for surgery, with the diagnosis of End Stage Renal Diseae  N18.6  The various methods of treatment have been discussed with the patient and family. After consideration of risks, benefits and other options for treatment, the patient has consented to  Procedure(s): [place] (Left) as a surgical intervention .  The patient's history has been reviewed, patient examined, no change in status, stable for surgery.  I have reviewed the patient's chart and labs.  Questions were answered to the patient's satisfaction.     Ruta Hinds

## 2015-04-01 NOTE — H&P (View-Only) (Signed)
Patient name: Edward Colon MRN: [id number] DOB: Oct 31, 1980 Sex: male     Chief Complaint  Patient presents with  . Re-evaluation    4-6 wk f/u s/p R RC AVF  - HD TTS via cath    HISTORY OF PRESENT ILLNESS:  the patient is back for follow-up.  He is status post right radiocephalic fistula placement and dialysis catheter. He is currently on dialysis Tuesday Thursday Saturday.  He is right-handed.  Past Medical History  Diagnosis Date  . Diabetes mellitus without complication ([place])   . Hypertension   . Seizures ([place])   . Chronic kidney disease   . Type 1 diabetes [facility])     Past Surgical History  Procedure Laterality Date  . Insertion of dialysis catheter Right 03/03/2015    Procedure: INSERTION OF DIALYSIS CATHETER;  Surgeon: Serafina Mitchell, MD;  Location: [place];  Service: Vascular;  Laterality: Right;  . Av fistula placement Right 03/03/2015    Procedure: RIGHT RADIO-CEPHALIC ARTERIOVENOUS (AV) FISTULA CREATION;  Surgeon: Serafina Mitchell, MD;  [facility];  Service: Vascular;  Laterality: Right;    Social History   Social History  . Marital Status: Single    Spouse Name: N/A  . Number of Children: N/A  . Years of Education: N/A   Occupational History  . Not on file.   Social History Main Topics  . Smoking status: Never Smoker   . Smokeless tobacco: Never Used  . Alcohol Use: No  . Drug Use: No  . Sexual Activity: No   Other Topics Concern  . Not on file   Social History Narrative    Family History  Problem Relation Age of Onset  . Hypertension Mother   . Hypertension Father     Allergies as of 03/30/2015  . (No Known Allergies)    Current Outpatient Prescriptions on File Prior to Visit  Medication Sig Dispense Refill  . amLODipine (NORVASC) 10 MG tablet Take 1 tablet (10 mg total) by mouth daily. 90 tablet 3  . calcium acetate (PHOSLO) 667 MG capsule Take 1 capsule (667 mg total) by mouth 3 (three) times daily with meals. 90 capsule 0    . carvedilol (COREG) 25 MG tablet Take 25 mg by mouth 2 (two) times daily.  5  . insulin glargine (LANTUS) 100 UNIT/ML injection Inject 0.2 mLs (20 Units total) into the skin every morning. 10 mL 0  . insulin lispro (HUMALOG) 100 UNIT/ML injection Inject 2-10 Units into the skin 3 (three) times daily as needed for high blood sugar (high blood sugar).     . multivitamin (RENA-VIT) TABS tablet Take 1 tablet by mouth at bedtime. 30 tablet 0  . levETIRAcetam (KEPPRA) 500 MG tablet Take 1 tablet (500 mg total) by mouth 2 (two) times daily. (Patient not taking: Reported on 03/30/2015) 60 tablet 1   No current facility-administered medications on file prior to visit.       PHYSICAL EXAMINATION:   Vital signs are  Filed Vitals:   03/30/15 1336 03/30/15 1338  BP: 154/92 154/94  Pulse: 76   Height: 5\' 11"  (1.803 m)   Weight: 196 lb 11.2 oz (89.223 kg)   SpO2: 99%    Body mass index is 27.45 kg/(m^2). General: The patient appears their stated age.  no thrill within right radiocephalic fistula  Diagnostic Studies  I have reviewed his ultrasound.  His right radiocephalic fistula has occluded. I had him vein mapped again. The left cephalic  vein is too small.  The left and right basilic vein appeared to be marginal , ranging from 0.34-0.28 cm on the left.  Assessment:  end-stage renal disease Plan:  I discussed that his fistula has occluded and that we need to establish new access. Because he is right-handed, I have recommended a left arm procedure his vein mapping shows that his  Basilic veins are adequate.  We discussed proceeding with a left first stage basilic vein transposition.  I'm going to try to get this scheduled as soon as possible.  He understands that one of my partners may be doing the procedure in order to get him taken care of sooner.  Eldridge Abrahams, M.D. [facility] [place] Office: [phone] Pager:  [phone]

## 2015-04-01 NOTE — Anesthesia Procedure Notes (Signed)
Procedure Name: LMA Insertion Date/Time: 04/01/2015 12:06 PM Performed by: Rejeana Brock L Pre-anesthesia Checklist: Patient identified, Timeout performed, Emergency Drugs available, Suction available and Patient being monitored Patient Re-evaluated:Patient Re-evaluated prior to inductionOxygen Delivery Method: Circle system utilized Preoxygenation: Pre-oxygenation with 100% oxygen Intubation Type: IV induction Ventilation: Mask ventilation without difficulty LMA: LMA inserted LMA Size: 5.0 Number of attempts: 1 Placement Confirmation: positive ETCO2 and breath sounds checked- equal and bilateral Tube secured with: Tape Dental Injury: Teeth and Oropharynx as per pre-operative assessment

## 2015-04-01 NOTE — Anesthesia Postprocedure Evaluation (Signed)
Anesthesia Post Note  Patient: AKSHAR BLASE  Procedure(s) Performed: Procedure(s) (LRB): BASCILIC VEIN TRANSPOSITION-LEFT ARM- FIRST STAGE (Left)  Patient location during evaluation: PACU Anesthesia Type: General Level of consciousness: awake and alert Pain management: pain level controlled Vital Signs Assessment: post-procedure vital signs reviewed and stable Respiratory status: spontaneous breathing, nonlabored ventilation and respiratory function stable Cardiovascular status: blood pressure returned to baseline and stable Postop Assessment: no signs of nausea or vomiting Anesthetic complications: no    Last Vitals:  Filed Vitals:   04/01/15 1443 04/01/15 1514  BP: 131/87 130/76  Pulse: 79 84  Temp:    Resp: 19     Last Pain: There were no vitals filed for this visit.               Lundon Verdejo A

## 2015-04-02 ENCOUNTER — Encounter (HOSPITAL_COMMUNITY): Payer: Self-pay | Admitting: Vascular Surgery

## 2015-04-02 DIAGNOSIS — D631 Anemia in chronic kidney disease: Secondary | ICD-10-CM | POA: Diagnosis not present

## 2015-04-02 DIAGNOSIS — N186 End stage renal disease: Secondary | ICD-10-CM | POA: Diagnosis not present

## 2015-04-02 DIAGNOSIS — D508 Other iron deficiency anemias: Secondary | ICD-10-CM | POA: Diagnosis not present

## 2015-04-03 ENCOUNTER — Telehealth: Payer: Self-pay | Admitting: Vascular Surgery

## 2015-04-03 NOTE — Telephone Encounter (Signed)
Spoke with pt to schedule, he refused all appointments other than a 3:30pm

## 2015-04-03 NOTE — Telephone Encounter (Signed)
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  F/u with Dr. Oneida Alar with duplex in 6 weeks.  Thanks, Aldona Bar

## 2015-04-04 DIAGNOSIS — D508 Other iron deficiency anemias: Secondary | ICD-10-CM | POA: Diagnosis not present

## 2015-04-04 DIAGNOSIS — N186 End stage renal disease: Secondary | ICD-10-CM | POA: Diagnosis not present

## 2015-04-04 DIAGNOSIS — D631 Anemia in chronic kidney disease: Secondary | ICD-10-CM | POA: Diagnosis not present

## 2015-04-06 ENCOUNTER — Encounter (HOSPITAL_COMMUNITY): Payer: Medicare Other

## 2015-04-06 ENCOUNTER — Encounter: Payer: Medicare Other | Admitting: Surgery

## 2015-04-07 DIAGNOSIS — D631 Anemia in chronic kidney disease: Secondary | ICD-10-CM | POA: Diagnosis not present

## 2015-04-07 DIAGNOSIS — E1122 Type 2 diabetes mellitus with diabetic chronic kidney disease: Secondary | ICD-10-CM | POA: Diagnosis not present

## 2015-04-07 DIAGNOSIS — Z992 Dependence on renal dialysis: Secondary | ICD-10-CM | POA: Diagnosis not present

## 2015-04-07 DIAGNOSIS — N186 End stage renal disease: Secondary | ICD-10-CM | POA: Diagnosis not present

## 2015-04-07 DIAGNOSIS — D508 Other iron deficiency anemias: Secondary | ICD-10-CM | POA: Diagnosis not present

## 2015-04-08 DIAGNOSIS — R7309 Other abnormal glucose: Secondary | ICD-10-CM | POA: Diagnosis not present

## 2015-04-08 DIAGNOSIS — R739 Hyperglycemia, unspecified: Secondary | ICD-10-CM | POA: Diagnosis not present

## 2015-04-09 DIAGNOSIS — N186 End stage renal disease: Secondary | ICD-10-CM | POA: Diagnosis not present

## 2015-04-09 DIAGNOSIS — E1022 Type 1 diabetes mellitus with diabetic chronic kidney disease: Secondary | ICD-10-CM | POA: Diagnosis not present

## 2015-04-09 DIAGNOSIS — D508 Other iron deficiency anemias: Secondary | ICD-10-CM | POA: Diagnosis not present

## 2015-04-11 DIAGNOSIS — D508 Other iron deficiency anemias: Secondary | ICD-10-CM | POA: Diagnosis not present

## 2015-04-11 DIAGNOSIS — E1022 Type 1 diabetes mellitus with diabetic chronic kidney disease: Secondary | ICD-10-CM | POA: Diagnosis not present

## 2015-04-11 DIAGNOSIS — N186 End stage renal disease: Secondary | ICD-10-CM | POA: Diagnosis not present

## 2015-04-14 DIAGNOSIS — E1022 Type 1 diabetes mellitus with diabetic chronic kidney disease: Secondary | ICD-10-CM | POA: Diagnosis not present

## 2015-04-14 DIAGNOSIS — D508 Other iron deficiency anemias: Secondary | ICD-10-CM | POA: Diagnosis not present

## 2015-04-14 DIAGNOSIS — N186 End stage renal disease: Secondary | ICD-10-CM | POA: Diagnosis not present

## 2015-04-16 DIAGNOSIS — N186 End stage renal disease: Secondary | ICD-10-CM | POA: Diagnosis not present

## 2015-04-16 DIAGNOSIS — D508 Other iron deficiency anemias: Secondary | ICD-10-CM | POA: Diagnosis not present

## 2015-04-16 DIAGNOSIS — E1022 Type 1 diabetes mellitus with diabetic chronic kidney disease: Secondary | ICD-10-CM | POA: Diagnosis not present

## 2015-04-18 DIAGNOSIS — E1022 Type 1 diabetes mellitus with diabetic chronic kidney disease: Secondary | ICD-10-CM | POA: Diagnosis not present

## 2015-04-18 DIAGNOSIS — D508 Other iron deficiency anemias: Secondary | ICD-10-CM | POA: Diagnosis not present

## 2015-04-18 DIAGNOSIS — N186 End stage renal disease: Secondary | ICD-10-CM | POA: Diagnosis not present

## 2015-04-21 DIAGNOSIS — N186 End stage renal disease: Secondary | ICD-10-CM | POA: Diagnosis not present

## 2015-04-21 DIAGNOSIS — D508 Other iron deficiency anemias: Secondary | ICD-10-CM | POA: Diagnosis not present

## 2015-04-21 DIAGNOSIS — E1022 Type 1 diabetes mellitus with diabetic chronic kidney disease: Secondary | ICD-10-CM | POA: Diagnosis not present

## 2015-04-23 DIAGNOSIS — E1022 Type 1 diabetes mellitus with diabetic chronic kidney disease: Secondary | ICD-10-CM | POA: Diagnosis not present

## 2015-04-23 DIAGNOSIS — N186 End stage renal disease: Secondary | ICD-10-CM | POA: Diagnosis not present

## 2015-04-23 DIAGNOSIS — D508 Other iron deficiency anemias: Secondary | ICD-10-CM | POA: Diagnosis not present

## 2015-04-25 DIAGNOSIS — N186 End stage renal disease: Secondary | ICD-10-CM | POA: Diagnosis not present

## 2015-04-25 DIAGNOSIS — E1022 Type 1 diabetes mellitus with diabetic chronic kidney disease: Secondary | ICD-10-CM | POA: Diagnosis not present

## 2015-04-25 DIAGNOSIS — D508 Other iron deficiency anemias: Secondary | ICD-10-CM | POA: Diagnosis not present

## 2015-04-28 DIAGNOSIS — D508 Other iron deficiency anemias: Secondary | ICD-10-CM | POA: Diagnosis not present

## 2015-04-28 DIAGNOSIS — E1022 Type 1 diabetes mellitus with diabetic chronic kidney disease: Secondary | ICD-10-CM | POA: Diagnosis not present

## 2015-04-28 DIAGNOSIS — N186 End stage renal disease: Secondary | ICD-10-CM | POA: Diagnosis not present

## 2015-04-30 DIAGNOSIS — N186 End stage renal disease: Secondary | ICD-10-CM | POA: Diagnosis not present

## 2015-04-30 DIAGNOSIS — E1022 Type 1 diabetes mellitus with diabetic chronic kidney disease: Secondary | ICD-10-CM | POA: Diagnosis not present

## 2015-04-30 DIAGNOSIS — D508 Other iron deficiency anemias: Secondary | ICD-10-CM | POA: Diagnosis not present

## 2015-05-02 DIAGNOSIS — D508 Other iron deficiency anemias: Secondary | ICD-10-CM | POA: Diagnosis not present

## 2015-05-02 DIAGNOSIS — N186 End stage renal disease: Secondary | ICD-10-CM | POA: Diagnosis not present

## 2015-05-02 DIAGNOSIS — E1022 Type 1 diabetes mellitus with diabetic chronic kidney disease: Secondary | ICD-10-CM | POA: Diagnosis not present

## 2015-05-05 ENCOUNTER — Encounter: Payer: Self-pay | Admitting: Vascular Surgery

## 2015-05-05 DIAGNOSIS — E1022 Type 1 diabetes mellitus with diabetic chronic kidney disease: Secondary | ICD-10-CM | POA: Diagnosis not present

## 2015-05-05 DIAGNOSIS — N186 End stage renal disease: Secondary | ICD-10-CM | POA: Diagnosis not present

## 2015-05-05 DIAGNOSIS — D508 Other iron deficiency anemias: Secondary | ICD-10-CM | POA: Diagnosis not present

## 2015-05-05 DIAGNOSIS — E1122 Type 2 diabetes mellitus with diabetic chronic kidney disease: Secondary | ICD-10-CM | POA: Diagnosis not present

## 2015-05-05 DIAGNOSIS — Z992 Dependence on renal dialysis: Secondary | ICD-10-CM | POA: Diagnosis not present

## 2015-05-07 ENCOUNTER — Encounter: Payer: Self-pay | Admitting: Vascular Surgery

## 2015-05-07 ENCOUNTER — Ambulatory Visit (INDEPENDENT_AMBULATORY_CARE_PROVIDER_SITE_OTHER): Payer: Medicare Other | Admitting: Vascular Surgery

## 2015-05-07 VITALS — BP 137/82 | HR 93 | Temp 98.1°F | Resp 16 | Ht 71.0 in | Wt 189.0 lb

## 2015-05-07 DIAGNOSIS — N2581 Secondary hyperparathyroidism of renal origin: Secondary | ICD-10-CM | POA: Diagnosis not present

## 2015-05-07 DIAGNOSIS — N186 End stage renal disease: Secondary | ICD-10-CM

## 2015-05-07 DIAGNOSIS — D631 Anemia in chronic kidney disease: Secondary | ICD-10-CM | POA: Diagnosis not present

## 2015-05-07 DIAGNOSIS — Z992 Dependence on renal dialysis: Secondary | ICD-10-CM

## 2015-05-07 DIAGNOSIS — E1022 Type 1 diabetes mellitus with diabetic chronic kidney disease: Secondary | ICD-10-CM | POA: Diagnosis not present

## 2015-05-07 NOTE — Progress Notes (Signed)
Patient returns for follow-up today. He underwent left basilic vein transposition fistula on 03/28/2015. This is now occluded. The patient's previously mapping ultrasound has shown marginal veins bilaterally. He is also previously failed a right radiocephalic AV fistula. He is still healing incisions on his left upper extremity although these are close to being healed. He currently is dialyzing via a catheter.  Past Medical History  Diagnosis Date  . Diabetes mellitus without complication ([place])   . Hypertension   . Chronic kidney disease   . Type 1 diabetes ([place])   . Heart murmur     denies any problems with it  . Seizures ([place])     none for 2 months as of 03/31/15  . Neuropathy ([place])   . Anemia   . Blind one eye     left eye     Physical exam:  Filed Vitals:   05/07/15 1513  BP: 137/82  Pulse: 93  Temp: 98.1 F (36.7 C)  TempSrc: Oral  Resp: 16  Height: 5\' 11"  (1.803 m)  Weight: 189 lb (85.73 kg)  SpO2: 100%    Left upper extremity: Some slight separation of both incisions in the antecubital region. No significant drainage no edema no erythema left hand is well-perfused  Assessment: Discussed with the patient today placing a new permanent access. I gave him the option of going to the right arm or staying in the left arm. Since he is right hand dominant he would prefer to stay in the left arm. Totally would have in follow-up in 3 weeks to make sure the incisions are fully healed in his left upper extremity. If they're fully healed at that point we will proceed with a left upper arm AV graft.   Plan: See above  Ruta Hinds, MD [facility] [place] Office: [phone] Pager: [phone]

## 2015-05-09 DIAGNOSIS — E1022 Type 1 diabetes mellitus with diabetic chronic kidney disease: Secondary | ICD-10-CM | POA: Diagnosis not present

## 2015-05-09 DIAGNOSIS — N186 End stage renal disease: Secondary | ICD-10-CM | POA: Diagnosis not present

## 2015-05-09 DIAGNOSIS — N2581 Secondary hyperparathyroidism of renal origin: Secondary | ICD-10-CM | POA: Diagnosis not present

## 2015-05-09 DIAGNOSIS — D631 Anemia in chronic kidney disease: Secondary | ICD-10-CM | POA: Diagnosis not present

## 2015-05-12 DIAGNOSIS — N2581 Secondary hyperparathyroidism of renal origin: Secondary | ICD-10-CM | POA: Diagnosis not present

## 2015-05-12 DIAGNOSIS — D631 Anemia in chronic kidney disease: Secondary | ICD-10-CM | POA: Diagnosis not present

## 2015-05-12 DIAGNOSIS — E1022 Type 1 diabetes mellitus with diabetic chronic kidney disease: Secondary | ICD-10-CM | POA: Diagnosis not present

## 2015-05-12 DIAGNOSIS — N186 End stage renal disease: Secondary | ICD-10-CM | POA: Diagnosis not present

## 2015-05-14 ENCOUNTER — Encounter: Payer: Medicare Other | Admitting: Vascular Surgery

## 2015-05-14 ENCOUNTER — Encounter (HOSPITAL_COMMUNITY): Payer: Medicare Other

## 2015-05-14 DIAGNOSIS — D631 Anemia in chronic kidney disease: Secondary | ICD-10-CM | POA: Diagnosis not present

## 2015-05-14 DIAGNOSIS — N186 End stage renal disease: Secondary | ICD-10-CM | POA: Diagnosis not present

## 2015-05-14 DIAGNOSIS — N2581 Secondary hyperparathyroidism of renal origin: Secondary | ICD-10-CM | POA: Diagnosis not present

## 2015-05-14 DIAGNOSIS — E1022 Type 1 diabetes mellitus with diabetic chronic kidney disease: Secondary | ICD-10-CM | POA: Diagnosis not present

## 2015-05-16 DIAGNOSIS — N2581 Secondary hyperparathyroidism of renal origin: Secondary | ICD-10-CM | POA: Diagnosis not present

## 2015-05-16 DIAGNOSIS — D631 Anemia in chronic kidney disease: Secondary | ICD-10-CM | POA: Diagnosis not present

## 2015-05-16 DIAGNOSIS — N186 End stage renal disease: Secondary | ICD-10-CM | POA: Diagnosis not present

## 2015-05-16 DIAGNOSIS — E1022 Type 1 diabetes mellitus with diabetic chronic kidney disease: Secondary | ICD-10-CM | POA: Diagnosis not present

## 2015-05-19 DIAGNOSIS — N2581 Secondary hyperparathyroidism of renal origin: Secondary | ICD-10-CM | POA: Diagnosis not present

## 2015-05-19 DIAGNOSIS — D631 Anemia in chronic kidney disease: Secondary | ICD-10-CM | POA: Diagnosis not present

## 2015-05-19 DIAGNOSIS — N186 End stage renal disease: Secondary | ICD-10-CM | POA: Diagnosis not present

## 2015-05-19 DIAGNOSIS — E1022 Type 1 diabetes mellitus with diabetic chronic kidney disease: Secondary | ICD-10-CM | POA: Diagnosis not present

## 2015-05-20 ENCOUNTER — Encounter: Payer: Self-pay | Admitting: Vascular Surgery

## 2015-05-21 DIAGNOSIS — N2581 Secondary hyperparathyroidism of renal origin: Secondary | ICD-10-CM | POA: Diagnosis not present

## 2015-05-21 DIAGNOSIS — D631 Anemia in chronic kidney disease: Secondary | ICD-10-CM | POA: Diagnosis not present

## 2015-05-21 DIAGNOSIS — E1022 Type 1 diabetes mellitus with diabetic chronic kidney disease: Secondary | ICD-10-CM | POA: Diagnosis not present

## 2015-05-21 DIAGNOSIS — N186 End stage renal disease: Secondary | ICD-10-CM | POA: Diagnosis not present

## 2015-05-23 DIAGNOSIS — D631 Anemia in chronic kidney disease: Secondary | ICD-10-CM | POA: Diagnosis not present

## 2015-05-23 DIAGNOSIS — N2581 Secondary hyperparathyroidism of renal origin: Secondary | ICD-10-CM | POA: Diagnosis not present

## 2015-05-23 DIAGNOSIS — N186 End stage renal disease: Secondary | ICD-10-CM | POA: Diagnosis not present

## 2015-05-23 DIAGNOSIS — E1022 Type 1 diabetes mellitus with diabetic chronic kidney disease: Secondary | ICD-10-CM | POA: Diagnosis not present

## 2015-05-26 ENCOUNTER — Encounter: Payer: Self-pay | Admitting: Vascular Surgery

## 2015-05-26 DIAGNOSIS — E1022 Type 1 diabetes mellitus with diabetic chronic kidney disease: Secondary | ICD-10-CM | POA: Diagnosis not present

## 2015-05-26 DIAGNOSIS — D631 Anemia in chronic kidney disease: Secondary | ICD-10-CM | POA: Diagnosis not present

## 2015-05-26 DIAGNOSIS — N2581 Secondary hyperparathyroidism of renal origin: Secondary | ICD-10-CM | POA: Diagnosis not present

## 2015-05-26 DIAGNOSIS — N186 End stage renal disease: Secondary | ICD-10-CM | POA: Diagnosis not present

## 2015-05-27 ENCOUNTER — Ambulatory Visit (INDEPENDENT_AMBULATORY_CARE_PROVIDER_SITE_OTHER): Payer: Self-pay | Admitting: Vascular Surgery

## 2015-05-27 ENCOUNTER — Encounter: Payer: Self-pay | Admitting: Vascular Surgery

## 2015-05-27 VITALS — BP 103/64 | HR 88 | Temp 98.3°F | Resp 14 | Ht 71.0 in | Wt 186.0 lb

## 2015-05-27 DIAGNOSIS — Z992 Dependence on renal dialysis: Secondary | ICD-10-CM

## 2015-05-27 DIAGNOSIS — N186 End stage renal disease: Secondary | ICD-10-CM

## 2015-05-27 NOTE — Progress Notes (Signed)
Patient returns for follow-up today. He underwent left basilic vein transposition fistula on 03/28/2015. This is now occluded. The patient's previously mapping ultrasound has shown marginal veins bilaterally. He is also previously failed a right radiocephalic AV fistula. He is still healing incisions on his left upper extremity although these are close to being healed. He currently is dialyzing via a catheter on the right side. He returns today for consideration of a new hemodialysis access. He denies any numbness or tingling in his hands. He denies any shortness of breath. He denies any chest pain.    Past Medical History   Diagnosis  Date   .  Diabetes mellitus without complication ([place])     .  Hypertension     .  Chronic kidney disease     .  Type 1 diabetes ([place])     .  Heart murmur         denies any problems with it   .  Seizures ([place])         none for 2 months as of 03/31/15   .  Neuropathy ([place])     .  Anemia     .  Blind one eye         left eye      Physical exam:     Filed Vitals:   05/27/15 0937  BP: 103/64  Pulse: 88  Temp: 98.3 F (36.8 C)  TempSrc: Oral  Resp: 14  Height: 5\' 11"  (1.803 m)  Weight: 186 lb (84.369 kg)  SpO2: 99%    Left upper extremity: Well-healed left arm incision. No significant drainage no edema no erythema left hand is well-perfused  Assessment: Discussed with the patient today placing a new permanent access. Plan will be for a left upper arm AV graft. Risks benefits possible competitions procedure details including but not limited to ischemic steal graft thrombosis possible further revisions infection were discussed patient today. He understands and agrees to proceed. This will be scheduled for 06/15/2015.  He did request further narcotic pain medication from his previous operation. I told him he does not need that at this point.  Ruta Hinds, MD [facility] [place] Office: [phone] Pager: [phone]

## 2015-05-28 ENCOUNTER — Other Ambulatory Visit: Payer: Self-pay

## 2015-05-28 DIAGNOSIS — N2581 Secondary hyperparathyroidism of renal origin: Secondary | ICD-10-CM | POA: Diagnosis not present

## 2015-05-28 DIAGNOSIS — D631 Anemia in chronic kidney disease: Secondary | ICD-10-CM | POA: Diagnosis not present

## 2015-05-28 DIAGNOSIS — E1022 Type 1 diabetes mellitus with diabetic chronic kidney disease: Secondary | ICD-10-CM | POA: Diagnosis not present

## 2015-05-28 DIAGNOSIS — N186 End stage renal disease: Secondary | ICD-10-CM | POA: Diagnosis not present

## 2015-05-30 DIAGNOSIS — E1022 Type 1 diabetes mellitus with diabetic chronic kidney disease: Secondary | ICD-10-CM | POA: Diagnosis not present

## 2015-05-30 DIAGNOSIS — N2581 Secondary hyperparathyroidism of renal origin: Secondary | ICD-10-CM | POA: Diagnosis not present

## 2015-05-30 DIAGNOSIS — N186 End stage renal disease: Secondary | ICD-10-CM | POA: Diagnosis not present

## 2015-05-30 DIAGNOSIS — D631 Anemia in chronic kidney disease: Secondary | ICD-10-CM | POA: Diagnosis not present

## 2015-05-30 DIAGNOSIS — E1129 Type 2 diabetes mellitus with other diabetic kidney complication: Secondary | ICD-10-CM | POA: Diagnosis not present

## 2015-06-01 DIAGNOSIS — E119 Type 2 diabetes mellitus without complications: Secondary | ICD-10-CM | POA: Diagnosis not present

## 2015-06-01 DIAGNOSIS — Z7682 Awaiting organ transplant status: Secondary | ICD-10-CM | POA: Diagnosis not present

## 2015-06-02 DIAGNOSIS — D631 Anemia in chronic kidney disease: Secondary | ICD-10-CM | POA: Diagnosis not present

## 2015-06-02 DIAGNOSIS — N2581 Secondary hyperparathyroidism of renal origin: Secondary | ICD-10-CM | POA: Diagnosis not present

## 2015-06-02 DIAGNOSIS — N186 End stage renal disease: Secondary | ICD-10-CM | POA: Diagnosis not present

## 2015-06-02 DIAGNOSIS — E1022 Type 1 diabetes mellitus with diabetic chronic kidney disease: Secondary | ICD-10-CM | POA: Diagnosis not present

## 2015-06-04 ENCOUNTER — Other Ambulatory Visit: Payer: Self-pay | Admitting: Nephrology

## 2015-06-04 DIAGNOSIS — E1022 Type 1 diabetes mellitus with diabetic chronic kidney disease: Secondary | ICD-10-CM | POA: Diagnosis not present

## 2015-06-04 DIAGNOSIS — N2581 Secondary hyperparathyroidism of renal origin: Secondary | ICD-10-CM | POA: Diagnosis not present

## 2015-06-04 DIAGNOSIS — D631 Anemia in chronic kidney disease: Secondary | ICD-10-CM | POA: Diagnosis not present

## 2015-06-04 DIAGNOSIS — N186 End stage renal disease: Secondary | ICD-10-CM | POA: Diagnosis not present

## 2015-06-04 DIAGNOSIS — Z940 Kidney transplant status: Secondary | ICD-10-CM

## 2015-06-05 ENCOUNTER — Ambulatory Visit (INDEPENDENT_AMBULATORY_CARE_PROVIDER_SITE_OTHER): Payer: Medicare Other | Admitting: Endocrinology

## 2015-06-05 ENCOUNTER — Encounter: Payer: Self-pay | Admitting: Endocrinology

## 2015-06-05 VITALS — BP 136/88 | HR 96 | Temp 98.5°F | Ht 71.0 in | Wt 191.0 lb

## 2015-06-05 DIAGNOSIS — N182 Chronic kidney disease, stage 2 (mild): Secondary | ICD-10-CM | POA: Diagnosis not present

## 2015-06-05 DIAGNOSIS — N186 End stage renal disease: Secondary | ICD-10-CM | POA: Diagnosis not present

## 2015-06-05 DIAGNOSIS — E1122 Type 2 diabetes mellitus with diabetic chronic kidney disease: Secondary | ICD-10-CM | POA: Diagnosis not present

## 2015-06-05 DIAGNOSIS — Z992 Dependence on renal dialysis: Secondary | ICD-10-CM | POA: Diagnosis not present

## 2015-06-05 LAB — POCT GLYCOSYLATED HEMOGLOBIN (HGB A1C): HEMOGLOBIN A1C: 11

## 2015-06-05 MED ORDER — GLUCOSE BLOOD VI STRP: 1.0000 | ORAL_STRIP | Freq: Four times a day (QID) | Status: DC

## 2015-06-05 NOTE — Progress Notes (Signed)
Subjective:    Patient ID: Edward Colon, male    DOB: 1980-10-04, 35 y.o.   MRN: [id number]  HPI pt states DM was dx'ed in 1990; he has mild if any neuropathy of the lower extremities, but he has associated retinopathy and ESRD; he has been on insulin since dx; pt says his diet and exercise are good; he has never had pancreatitis.  He last had severe hypoglycemia a few weeks ago.  last episode of DKA was in approx 2007.  He takes lantus 20 units qd, and prn humalog (averages approx 10 units total per day).  He says cbg's vary from 80-200.  It is in general higher as the day goes on.  He says he doesn't know why his a1c is so high.   Past Medical History  Diagnosis Date  . Diabetes mellitus without complication ([place])   . Hypertension   . Chronic kidney disease   . Type 1 diabetes ([place])   . Heart murmur     denies any problems with it  . Seizures ([place])     none for 2 months as of 03/31/15  . Neuropathy ([place])   . Anemia   . Blind one eye     left eye    Past Surgical History  Procedure Laterality Date  . Insertion of dialysis catheter Right 03/03/2015    Procedure: INSERTION OF DIALYSIS CATHETER;  Surgeon: Serafina Mitchell, MD;  Location: [facility];  Service: Vascular;  Laterality: Right;  . Av fistula placement Right 03/03/2015    Procedure: RIGHT RADIO-CEPHALIC ARTERIOVENOUS (AV) FISTULA CREATION;  Surgeon: Serafina Mitchell, MD;  Location: [facility] OR;  Service: Vascular;  Laterality: Right;  . Eye surgery Right     for diabetic retinopathy  . Bascilic vein transposition Left 04/01/2015    Procedure: BASCILIC VEIN TRANSPOSITION-LEFT ARM- FIRST STAGE;  Surgeon: Elam Dutch, MD;  Location[facility];  Service: Vascular;  Laterality: Left;    Social History   Social History  . Marital Status: Single    Spouse Name: N/A  . Number of Children: N/A  . Years of Education: N/A   Occupational History  . Not on file.   Social History Main Topics  . Smoking status: Never Smoker   .  Smokeless tobacco: Never Used  . Alcohol Use: Yes     Comment: social use  . Drug Use: No  . Sexual Activity: No   Other Topics Concern  . Not on file   Social History Narrative    Current Outpatient Prescriptions on File Prior to Visit  Medication Sig Dispense Refill  . calcium acetate (PHOSLO) 667 MG capsule Take 1 capsule (667 mg total) by mouth 3 (three) times daily with meals. (Patient taking differently: Take 667 mg by mouth 3 (three) times daily with meals. 1 cap with meals. And if eats a snack pt to take 1 capsule) 90 capsule 0  . carvedilol (COREG) 25 MG tablet Take 25 mg by mouth 2 (two) times daily.  5  . hydrALAZINE (APRESOLINE) 100 MG tablet Take 100 mg by mouth 2 (two) times daily.   3  . insulin glargine (LANTUS) 100 UNIT/ML injection Inject 0.2 mLs (20 Units total) into the skin every morning. (Patient taking differently: Inject 25 Units into the skin every morning. ) 10 mL 0  . insulin lispro (HUMALOG) 100 UNIT/ML injection Inject 2-10 Units into the skin 3 (three) times daily as needed for high blood sugar (high blood sugar).     Marland Kitchen  multivitamin (RENA-VIT) TABS tablet Take 1 tablet by mouth at bedtime. 30 tablet 0  . oxyCODONE (ROXICODONE) 5 MG immediate release tablet Take 1 tablet (5 mg total) by mouth every 6 (six) hours as needed. 20 tablet 0   No current facility-administered medications on file prior to visit.    No Known Allergies  Family History  Problem Relation Age of Onset  . Diabetes Neg Hx     BP 136/88 mmHg  Pulse 96  Temp(Src) 98.5 F (36.9 C) (Oral)  Ht 5\' 11"  (1.803 m)  Wt 191 lb (86.637 kg)  BMI 26.65 kg/m2  SpO2 95%  Review of Systems denies headache, chest pain, sob, n/v, urinary frequency, excessive diaphoresis, depression, cold intolerance, rhinorrhea, and easy bruising.   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  He has lost 15 lbs x a few months.  He has intermittent leg cramps.      Objective:   Physical Exam VS: see vs  page GEN: no distress HEAD: head: no deformity eyes: no periorbital swelling, no proptosis external nose and ears are normal mouth: no lesion seen NECK: a healed scar is present (temp HD access).  i do not appreciate a nodule in the thyroid or elsewhere in the neck CHEST WALL: no deformity LUNGS: clear to auscultation BREASTS:  No gynecomastia CV: reg rate and rhythm, no murmur ABD: abdomen is soft, nontender.  no hepatosplenomegaly.  not distended.  no hernia MUSCULOSKELETAL: muscle bulk and strength are grossly normal.  no obvious joint swelling.  gait is normal and steady EXTEMITIES: no deformity.  no ulcer on the feet.  feet are of normal color and temp.  no edema.  Dialysis access scars on both arms.  PULSES: dorsalis pedis intact bilat.  no carotid bruit NEURO:  cn 2-12 grossly intact.   readily moves all 4's.  sensation is intact to touch on the feet SKIN:  Normal texture and temperature.  No rash or suspicious lesion is visible.   NODES:  None palpable at the neck PSYCH: alert, well-oriented.  Does not appear anxious nor depressed.   A1c=11.1% Lab Results  Component Value Date   CREATININE 6.66* 03/07/2015   BUN 25* 03/07/2015   NA 135 04/01/2015   K 4.7 04/01/2015   CL 100* 03/07/2015   CO2 25 03/07/2015  fructosamine=578  i personally reviewed electrocardiogram tracing (02/27/15): Indication: renal failure Impression: slightly long QT  I have reviewed outside records, and summarized: Pt was noted to have severely elevated a1c, and referred here.     Assessment & Plan:  DM: severe exacerbation.  a1c and fructosamine show much worse glycemic control than reported cbg's.  Weight loss, new to me: prob due to glycosuria.   ESRD: in this context, he is at risk for hypoglycemia.    Patient is advised the following: Patient Instructions  good diet and exercise significantly improve the control of your diabetes.  please let me know if you wish to be referred to a  dietician.  high blood sugar is very risky to your health.  you should see an eye doctor and dentist every year.  It is very important to get all recommended vaccinations.  controlling your blood pressure and cholesterol drastically reduces the damage diabetes does to your body.  Those who smoke should quit.  please discuss these with your doctor.  check your blood sugar 4 times a day: before the 3 meals, and at bedtime.  also check if you have symptoms of  your blood sugar being too high or too low.  please keep a record of the readings and bring it to your next appointment here (or you can bring the meter itself).  You can write it on any piece of paper.  please call us sooner if your blood sugar goes below 70, or if you have a lot of readings over 200. Here are 2 new meters.  i have sent a prescription to your pharmacy, for strips.   blood tests are requested for you today.  We'll let you know about the results.  Please come back for a follow-up appointment in 1 month.    addendum: increase lantus to 25 units qam.

## 2015-06-05 NOTE — Patient Instructions (Addendum)
good diet and exercise significantly improve the control of your diabetes.  please let me know if you wish to be referred to a dietician.  high blood sugar is very risky to your health.  you should see an eye doctor and dentist every year.  It is very important to get all recommended vaccinations.  controlling your blood pressure and cholesterol drastically reduces the damage diabetes does to your body.  Those who smoke should quit.  please discuss these with your doctor.  check your blood sugar 4 times a day: before the 3 meals, and at bedtime.  also check if you have symptoms of your blood sugar being too high or too low.  please keep a record of the readings and bring it to your next appointment here (or you can bring the meter itself).  You can write it on any piece of paper.  please call us sooner if your blood sugar goes below 70, or if you have a lot of readings over 200. Here are 2 new meters.  i have sent a prescription to your pharmacy, for strips.   blood tests are requested for you today.  We'll let you know about the results.  Please come back for a follow-up appointment in 1 month.

## 2015-06-06 DIAGNOSIS — N2581 Secondary hyperparathyroidism of renal origin: Secondary | ICD-10-CM | POA: Diagnosis not present

## 2015-06-06 DIAGNOSIS — N186 End stage renal disease: Secondary | ICD-10-CM | POA: Diagnosis not present

## 2015-06-06 DIAGNOSIS — E1022 Type 1 diabetes mellitus with diabetic chronic kidney disease: Secondary | ICD-10-CM | POA: Diagnosis not present

## 2015-06-06 DIAGNOSIS — D631 Anemia in chronic kidney disease: Secondary | ICD-10-CM | POA: Diagnosis not present

## 2015-06-06 LAB — FRUCTOSAMINE: Fructosamine: 578 umol/L — ABNORMAL HIGH (ref 0–285)

## 2015-06-08 ENCOUNTER — Other Ambulatory Visit: Payer: Self-pay | Admitting: Endocrinology

## 2015-06-08 NOTE — Telephone Encounter (Signed)
Please advise if ok to refill. We have not refilled this med before, Thanks!

## 2015-06-08 NOTE — Telephone Encounter (Signed)
We only see pt for DM This refill would go to PCP or cardiol.

## 2015-06-09 DIAGNOSIS — D631 Anemia in chronic kidney disease: Secondary | ICD-10-CM | POA: Diagnosis not present

## 2015-06-09 DIAGNOSIS — N186 End stage renal disease: Secondary | ICD-10-CM | POA: Diagnosis not present

## 2015-06-09 DIAGNOSIS — E1022 Type 1 diabetes mellitus with diabetic chronic kidney disease: Secondary | ICD-10-CM | POA: Diagnosis not present

## 2015-06-09 DIAGNOSIS — N2581 Secondary hyperparathyroidism of renal origin: Secondary | ICD-10-CM | POA: Diagnosis not present

## 2015-06-10 ENCOUNTER — Other Ambulatory Visit: Payer: Self-pay

## 2015-06-10 ENCOUNTER — Ambulatory Visit
Admission: RE | Admit: 2015-06-10 | Discharge: 2015-06-10 | Disposition: A | Discharge status: Home / Self Care | Payer: Medicare Other | Source: Ambulatory Visit | Attending: Nephrology | Admitting: Nephrology

## 2015-06-10 DIAGNOSIS — Z940 Kidney transplant status: Secondary | ICD-10-CM

## 2015-06-10 DIAGNOSIS — N182 Chronic kidney disease, stage 2 (mild): Principal | ICD-10-CM

## 2015-06-10 DIAGNOSIS — E1122 Type 2 diabetes mellitus with diabetic chronic kidney disease: Secondary | ICD-10-CM

## 2015-06-10 MED ORDER — ONETOUCH DELICA LANCETS 33G MISC: Status: DC

## 2015-06-10 MED ORDER — GLUCOSE BLOOD VI STRP: 1.0000 | ORAL_STRIP | Freq: Four times a day (QID) | Status: DC

## 2015-06-11 ENCOUNTER — Encounter (HOSPITAL_COMMUNITY): Payer: Self-pay | Admitting: *Deleted

## 2015-06-11 DIAGNOSIS — D631 Anemia in chronic kidney disease: Secondary | ICD-10-CM | POA: Diagnosis not present

## 2015-06-11 DIAGNOSIS — N186 End stage renal disease: Secondary | ICD-10-CM | POA: Diagnosis not present

## 2015-06-11 DIAGNOSIS — E1022 Type 1 diabetes mellitus with diabetic chronic kidney disease: Secondary | ICD-10-CM | POA: Diagnosis not present

## 2015-06-11 DIAGNOSIS — N2581 Secondary hyperparathyroidism of renal origin: Secondary | ICD-10-CM | POA: Diagnosis not present

## 2015-06-11 NOTE — Progress Notes (Signed)
Pt denies any acute cardiopulmonary issues. Pt stated that MD advised that he not take any insulin on DOS. Pt made aware to stop otc vitamins, fish oil, herbal medications and NSAID's. Pt verbalized understanding of all pre-op instructions.

## 2015-06-13 DIAGNOSIS — N186 End stage renal disease: Secondary | ICD-10-CM | POA: Diagnosis not present

## 2015-06-13 DIAGNOSIS — N2581 Secondary hyperparathyroidism of renal origin: Secondary | ICD-10-CM | POA: Diagnosis not present

## 2015-06-13 DIAGNOSIS — E1022 Type 1 diabetes mellitus with diabetic chronic kidney disease: Secondary | ICD-10-CM | POA: Diagnosis not present

## 2015-06-13 DIAGNOSIS — D631 Anemia in chronic kidney disease: Secondary | ICD-10-CM | POA: Diagnosis not present

## 2015-06-15 ENCOUNTER — Ambulatory Visit (HOSPITAL_COMMUNITY)
Admission: RE | Admit: 2015-06-15 | Discharge: 2015-06-15 | Disposition: A | Discharge status: Home / Self Care | Payer: Medicare Other | Source: Ambulatory Visit | Attending: Vascular Surgery | Admitting: Vascular Surgery

## 2015-06-15 ENCOUNTER — Ambulatory Visit (HOSPITAL_COMMUNITY): Payer: Medicare Other | Admitting: Anesthesiology

## 2015-06-15 ENCOUNTER — Encounter (HOSPITAL_COMMUNITY): Payer: Self-pay | Admitting: Surgery

## 2015-06-15 ENCOUNTER — Encounter (HOSPITAL_COMMUNITY)
Admission: RE | Disposition: A | Discharge status: Home / Self Care | Payer: Self-pay | Source: Ambulatory Visit | Attending: Vascular Surgery

## 2015-06-15 DIAGNOSIS — E1122 Type 2 diabetes mellitus with diabetic chronic kidney disease: Secondary | ICD-10-CM | POA: Diagnosis not present

## 2015-06-15 DIAGNOSIS — Z992 Dependence on renal dialysis: Secondary | ICD-10-CM | POA: Diagnosis not present

## 2015-06-15 DIAGNOSIS — N185 Chronic kidney disease, stage 5: Secondary | ICD-10-CM | POA: Diagnosis not present

## 2015-06-15 DIAGNOSIS — E1140 Type 2 diabetes mellitus with diabetic neuropathy, unspecified: Secondary | ICD-10-CM | POA: Insufficient documentation

## 2015-06-15 DIAGNOSIS — I120 Hypertensive chronic kidney disease with stage 5 chronic kidney disease or end stage renal disease: Secondary | ICD-10-CM | POA: Diagnosis not present

## 2015-06-15 DIAGNOSIS — Z794 Long term (current) use of insulin: Secondary | ICD-10-CM | POA: Diagnosis not present

## 2015-06-15 DIAGNOSIS — N186 End stage renal disease: Secondary | ICD-10-CM | POA: Diagnosis not present

## 2015-06-15 HISTORY — PX: AV FISTULA PLACEMENT: SHX1204

## 2015-06-15 LAB — GLUCOSE, CAPILLARY
GLUCOSE-CAPILLARY: 43 mg/dL — AB (ref 65–99)
Glucose-Capillary: 36 mg/dL — CL (ref 65–99)
Glucose-Capillary: 131 mg/dL — ABNORMAL HIGH (ref 65–99)
Glucose-Capillary: <10 mg/dL — CL (ref 65–99)
Glucose-Capillary: 102 mg/dL — ABNORMAL HIGH (ref 65–99)
Glucose-Capillary: 119 mg/dL — ABNORMAL HIGH (ref 65–99)
Glucose-Capillary: 149 mg/dL — ABNORMAL HIGH (ref 65–99)

## 2015-06-15 LAB — SURGICAL PCR SCREEN
MRSA, PCR: NEGATIVE
Staphylococcus aureus: POSITIVE — AB

## 2015-06-15 SURGERY — INSERTION OF ARTERIOVENOUS (AV) GORE-TEX GRAFT ARM
Anesthesia: General | Site: Arm Upper | Laterality: Left

## 2015-06-15 MED ORDER — PROPOFOL 10 MG/ML IV BOLUS
INTRAVENOUS | Status: AC
  Filled 2015-06-15: qty 20

## 2015-06-15 MED ORDER — DEXTROSE 50 % IV SOLN
50.0000 mL | Freq: Once | INTRAVENOUS | Status: AC
  Administered 2015-06-15: 50 mL via INTRAVENOUS

## 2015-06-15 MED ORDER — PROTAMINE SULFATE 10 MG/ML IV SOLN
INTRAVENOUS | Status: DC | PRN
  Administered 2015-06-15: 50 mg via INTRAVENOUS

## 2015-06-15 MED ORDER — DEXTROSE 50 % IV SOLN
INTRAVENOUS | Status: AC
Start: 2015-06-15 — End: 2015-06-15
  Administered 2015-06-15: 12.5 g via INTRAVENOUS
  Filled 2015-06-15: qty 50

## 2015-06-15 MED ORDER — FENTANYL CITRATE (PF) 100 MCG/2ML IJ SOLN: 25.0000 ug | INTRAMUSCULAR | Status: DC | PRN

## 2015-06-15 MED ORDER — DEXTROSE 50 % IV SOLN
25.0000 mL | Freq: Once | INTRAVENOUS | Status: AC
  Administered 2015-06-15: 25 mL via INTRAVENOUS
  Filled 2015-06-15: qty 50

## 2015-06-15 MED ORDER — LIDOCAINE HCL (CARDIAC) 20 MG/ML IV SOLN
INTRAVENOUS | Status: DC | PRN
  Administered 2015-06-15: 50 mg via INTRAVENOUS

## 2015-06-15 MED ORDER — DEXTROSE 5 % IV SOLN
INTRAVENOUS | Status: AC
  Filled 2015-06-15: qty 1.5

## 2015-06-15 MED ORDER — MIDAZOLAM HCL 5 MG/5ML IJ SOLN
INTRAMUSCULAR | Status: DC | PRN
  Administered 2015-06-15: 2 mg via INTRAVENOUS

## 2015-06-15 MED ORDER — FENTANYL CITRATE (PF) 100 MCG/2ML IJ SOLN
INTRAMUSCULAR | Status: DC | PRN
  Administered 2015-06-15 (×3): 50 ug via INTRAVENOUS
  Administered 2015-06-15: 100 ug via INTRAVENOUS

## 2015-06-15 MED ORDER — MUPIROCIN 2 % EX OINT
TOPICAL_OINTMENT | CUTANEOUS | Status: AC
  Filled 2015-06-15: qty 22

## 2015-06-15 MED ORDER — OXYCODONE HCL 5 MG PO TABS: 5.0000 mg | ORAL_TABLET | Freq: Four times a day (QID) | ORAL | Status: DC | PRN

## 2015-06-15 MED ORDER — MUPIROCIN 2 % EX OINT
1.0000 "application " | TOPICAL_OINTMENT | Freq: Once | CUTANEOUS | Status: AC
  Administered 2015-06-15: 1 via TOPICAL

## 2015-06-15 MED ORDER — HYDROCODONE-ACETAMINOPHEN 7.5-325 MG PO TABS: 1.0000 | ORAL_TABLET | Freq: Once | ORAL | Status: DC | PRN

## 2015-06-15 MED ORDER — SODIUM CHLORIDE 0.9 % IV SOLN
INTRAVENOUS | Status: DC
  Administered 2015-06-15 (×3): via INTRAVENOUS

## 2015-06-15 MED ORDER — SODIUM CHLORIDE 0.9 % IV SOLN
INTRAVENOUS | Status: DC | PRN
  Administered 2015-06-15: 500 mL

## 2015-06-15 MED ORDER — DEXTROSE 5 % IV SOLN
1.5000 g | INTRAVENOUS | Status: AC
  Administered 2015-06-15: 1.5 g via INTRAVENOUS

## 2015-06-15 MED ORDER — HEPARIN SODIUM (PORCINE) 1000 UNIT/ML IJ SOLN
INTRAMUSCULAR | Status: AC
  Filled 2015-06-15: qty 1

## 2015-06-15 MED ORDER — CHLORHEXIDINE GLUCONATE CLOTH 2 % EX PADS: 6.0000 | MEDICATED_PAD | Freq: Once | CUTANEOUS | Status: DC

## 2015-06-15 MED ORDER — DEXTROSE 50 % IV SOLN
INTRAVENOUS | Status: AC
Start: 2015-06-15 — End: 2015-06-15
  Administered 2015-06-15: 25 mL via INTRAVENOUS
  Filled 2015-06-15: qty 50

## 2015-06-15 MED ORDER — FENTANYL CITRATE (PF) 250 MCG/5ML IJ SOLN
INTRAMUSCULAR | Status: AC
  Filled 2015-06-15: qty 5

## 2015-06-15 MED ORDER — PROPOFOL 10 MG/ML IV BOLUS
INTRAVENOUS | Status: DC | PRN
  Administered 2015-06-15: 150 mg via INTRAVENOUS

## 2015-06-15 MED ORDER — HEPARIN SODIUM (PORCINE) 1000 UNIT/ML IJ SOLN
INTRAMUSCULAR | Status: DC | PRN
  Administered 2015-06-15: 5000 [IU] via INTRAVENOUS

## 2015-06-15 MED ORDER — LIDOCAINE HCL (PF) 1 % IJ SOLN
INTRAMUSCULAR | Status: AC
  Filled 2015-06-15: qty 30

## 2015-06-15 MED ORDER — 0.9 % SODIUM CHLORIDE (POUR BTL) OPTIME
TOPICAL | Status: DC | PRN
  Administered 2015-06-15: 1000 mL

## 2015-06-15 MED ORDER — LIDOCAINE HCL (CARDIAC) 20 MG/ML IV SOLN
INTRAVENOUS | Status: AC
  Filled 2015-06-15: qty 5

## 2015-06-15 MED ORDER — MIDAZOLAM HCL 2 MG/2ML IJ SOLN
INTRAMUSCULAR | Status: AC
  Filled 2015-06-15: qty 2

## 2015-06-15 SURGICAL SUPPLY — 33 items
ARMBAND PINK RESTRICT EXTREMIT (MISCELLANEOUS) ×2 IMPLANT
CANISTER SUCTION 2500CC (MISCELLANEOUS) ×2 IMPLANT
CANNULA VESSEL 3MM 2 BLNT TIP (CANNULA) IMPLANT
CLIP TI MEDIUM 6 (CLIP) ×2 IMPLANT
CLIP TI WIDE RED SMALL 6 (CLIP) ×2 IMPLANT
DECANTER SPIKE VIAL GLASS SM (MISCELLANEOUS) ×2 IMPLANT
ELECT REM PT RETURN 9FT ADLT (ELECTROSURGICAL) ×2
ELECTRODE REM PT RTRN 9FT ADLT (ELECTROSURGICAL) ×1 IMPLANT
GEL ULTRASOUND 20GR AQUASONIC (MISCELLANEOUS) IMPLANT
GLOVE BIO SURGEON STRL SZ 6.5 (GLOVE) ×4 IMPLANT
GLOVE BIO SURGEON STRL SZ7.5 (GLOVE) ×2 IMPLANT
GLOVE BIOGEL PI IND STRL 6.5 (GLOVE) IMPLANT
GLOVE BIOGEL PI IND STRL 7.0 (GLOVE) IMPLANT
GLOVE BIOGEL PI INDICATOR 6.5 (GLOVE) ×5
GLOVE BIOGEL PI INDICATOR 7.0 (GLOVE) ×1
GLOVE ECLIPSE 6.5 STRL STRAW (GLOVE) ×3 IMPLANT
GOWN STRL REUS W/ TWL LRG LVL3 (GOWN DISPOSABLE) ×3 IMPLANT
GOWN STRL REUS W/TWL LRG LVL3 (GOWN DISPOSABLE) ×12
GRAFT GORETEX STRT 6X50 (Vascular Products) ×1 IMPLANT
KIT BASIN OR (CUSTOM PROCEDURE TRAY) ×2 IMPLANT
KIT ROOM TURNOVER OR (KITS) ×2 IMPLANT
LIQUID BAND (GAUZE/BANDAGES/DRESSINGS) ×2 IMPLANT
LOOP VESSEL MINI RED (MISCELLANEOUS) IMPLANT
NS IRRIG 1000ML POUR BTL (IV SOLUTION) ×2 IMPLANT
PACK CV ACCESS (CUSTOM PROCEDURE TRAY) ×2 IMPLANT
PAD ARMBOARD 7.5X6 YLW CONV (MISCELLANEOUS) ×4 IMPLANT
SPONGE SURGIFOAM ABS GEL 100 (HEMOSTASIS) IMPLANT
SUT PROLENE 6 0 CC (SUTURE) ×9 IMPLANT
SUT VIC AB 3-0 SH 27 (SUTURE) ×4
SUT VIC AB 3-0 SH 27X BRD (SUTURE) ×2 IMPLANT
SUT VICRYL 4-0 PS2 18IN ABS (SUTURE) ×4 IMPLANT
UNDERPAD 30X30 INCONTINENT (UNDERPADS AND DIAPERS) ×2 IMPLANT
WATER STERILE IRR 1000ML POUR (IV SOLUTION) ×2 IMPLANT

## 2015-06-15 NOTE — Interval H&P Note (Signed)
History and Physical Interval Note:  06/15/2015 11:57 AM  Edward Colon  has presented today for surgery, with the diagnosis of End Stage Renal Disease N18.6  The various methods of treatment have been discussed with the patient and family. After consideration of risks, benefits and other options for treatment, the patient has consented to  Procedure(s): INSERTION OF ARTERIOVENOUS (AV) GORE-TEX GRAFT ARM (Left) as a surgical intervention .  The patient's history has been reviewed, patient examined, no change in status, stable for surgery.  I have reviewed the patient's chart and labs.  Questions were answered to the patient's satisfaction.     Ruta Hinds

## 2015-06-15 NOTE — Anesthesia Postprocedure Evaluation (Signed)
Anesthesia Post Note  Patient: Edward Colon  Procedure(s) Performed: Procedure(s) (LRB): INSERTION OF LEFT UPPER ARM  ARTERIOVENOUS (AV) 71mm x 50cm GORE-TEX GRAFT (Left)  Patient location during evaluation: PACU Anesthesia Type: General Level of consciousness: awake and alert Pain management: pain level controlled Vital Signs Assessment: post-procedure vital signs reviewed and stable Respiratory status: spontaneous breathing, nonlabored ventilation, respiratory function stable and patient connected to nasal cannula oxygen Cardiovascular status: blood pressure returned to baseline and stable Postop Assessment: no signs of nausea or vomiting Anesthetic complications: no    Last Vitals:  Filed Vitals:   06/15/15 1515 06/15/15 1545  BP: 132/76 133/85  Pulse: 80 81  Temp:  36.4 C  Resp: 16 16    Last Pain:  Filed Vitals:   06/15/15 1555  PainSc: 0-No pain                 Tiajuana Amass

## 2015-06-15 NOTE — Anesthesia Procedure Notes (Signed)
Procedure Name: LMA Insertion Date/Time: 06/15/2015 12:11 PM Performed by: Izora Gala Pre-anesthesia Checklist: Patient identified, Emergency Drugs available, Suction available and Patient being monitored Patient Re-evaluated:Patient Re-evaluated prior to inductionOxygen Delivery Method: Circle system utilized Preoxygenation: Pre-oxygenation with 100% oxygen Intubation Type: IV induction LMA: LMA inserted LMA Size: 5.0 Number of attempts: 1 Placement Confirmation: positive ETCO2 Tube secured with: Tape Dental Injury: Teeth and Oropharynx as per pre-operative assessment

## 2015-06-15 NOTE — Transfer of Care (Signed)
Immediate Anesthesia Transfer of Care Note  Patient: Edward Colon  Procedure(s) Performed: Procedure(s): INSERTION OF LEFT UPPER ARM  ARTERIOVENOUS (AV) 66mm x 50cm GORE-TEX GRAFT (Left)  Patient Location: PACU  Anesthesia Type:General  Level of Consciousness: awake and sedated  Airway & Oxygen Therapy: Patient Spontanous Breathing and Patient connected to nasal cannula oxygen  Post-op Assessment: Report given to RN, Post -op Vital signs reviewed and stable and Patient moving all extremities  Post vital signs: Reviewed and stable  Last Vitals:  Filed Vitals:   06/15/15 0854  BP: 148/85  Pulse: 79  Temp: 36.7 C  Resp: 18    Complications: No apparent anesthesia complications

## 2015-06-15 NOTE — H&P (View-Only) (Signed)
Patient returns for follow-up today. He underwent left basilic vein transposition fistula on 03/28/2015. This is now occluded. The patient's previously mapping ultrasound has shown marginal veins bilaterally. He is also previously failed a right radiocephalic AV fistula. He is still healing incisions on his left upper extremity although these are close to being healed. He currently is dialyzing via a catheter on the right side. He returns today for consideration of a new hemodialysis access. He denies any numbness or tingling in his hands. He denies any shortness of breath. He denies any chest pain.    Past Medical History   Diagnosis  Date   .  Diabetes mellitus without complication ([place])     .  Hypertension     .  Chronic kidney disease     .  Type 1 diabetes ([place])     .  Heart murmur         denies any problems with it   .  Seizures ([place])         none for 2 months as of 03/31/15   .  Neuropathy ([place])     .  Anemia     .  Blind one eye         left eye      Physical exam:     Filed Vitals:   05/27/15 0937  BP: 103/64  Pulse: 88  Temp: 98.3 F (36.8 C)  TempSrc: Oral  Resp: 14  Height: 5\' 11"  (1.803 m)  Weight: 186 lb (84.369 kg)  SpO2: 99%    Left upper extremity: Well-healed left arm incision. No significant drainage no edema no erythema left hand is well-perfused  Assessment: Discussed with the patient today placing a new permanent access. Plan will be for a left upper arm AV graft. Risks benefits possible competitions procedure details including but not limited to ischemic steal graft thrombosis possible further revisions infection were discussed patient today. He understands and agrees to proceed. This will be scheduled for 06/15/2015.  He did request further narcotic pain medication from his previous operation. I told him he does not need that at this point.  Ruta Hinds, MD [facility] Office: [phone] Pager: [phone]

## 2015-06-15 NOTE — Op Note (Signed)
Procedure: Left Upper Arm AV graft  Preop: ESRD  Postop: ESRD  Anesthesia: General  Assistant: Leontine Locket, PA-C  Findings:6 mm PTFE end to side to axillary vein   Procedure Details: The left upper extremity was prepped and draped in usual sterile fashion.  A longitudinal incision was then made near the antecubital crease the left arm. The incision was carried into the subcutaneous tissues down to level of the brachial artery. There was dense scar in this area from prior access so dissection was fairly tedious.   Next the brachial artery was dissected free in the medial portion incision. The artery was  3 mm in diameter. The vessel loops were placed proximal and distal to the planned site of arteriotomy. At this point, a longitudinal incision was made in the axilla and carried through the subcutaneous tissues and fascia to expose the axillary vein.  The nerves were protected.   The vein was approximately 4-5 mm in diameter.  It was dissected free and small side branches controlled with vessel loops or ligated with silk ties.  Next, a subcutaneous tunnel was created connecting the upper arm to the lower arm incision in an arcing configuration over the biceps muscle.  A 6 mm PTFE graft was then brought through this subcutaneous tunnel. The patient was given 5000 units of intravenous heparin. After appropriate circulation time, the vessel loops were used to control the artery. A longitudinal opening was made in the left brachial artery.  The end of the graft was beveled and sewn end to side to the artery using a 6 0 prolene.  At completion of the anastomosis the artery was forward bled, backbled and thoroughly flushed.  The anastomosis was secured, vessel loops were released and there was palpable pulse in the graft.  A few repair sutures were placed.  The graft was clamped just above the arterial anastomosis with a fistula clamp. The graft was then pulled taut to length at the axillary incision.  The  axillary vein was controlled with a fine bulldog clamp on a large side branch and high in the upper axilla and proximally.  A longitudinal opening was made in the vein. The distal end of the graft was then beveled and sewn end to side to the vein using a running 6 0 prolene.  Just prior to completion of the anastomosis, everything was forward bled, back bled and thoroughly flushed.  The anastomosis was secured and the fistula clamp removed from the proximal graft.  A thrill was immediately palpable in the graft. The patient was given 50 mg of protamine to assist with hemostasis.  After hemostasis was obtained, the subcutaneous tissues were reapproximated using a running 3-0 Vicryl suture. The skin was then closed with a 4 0 Vicryl subcuticular stitch. Dermabond was applied to the skin incisions.  The patient tolerated the procedure well and there were no complications.  Instrument sponge and needle count was correct at the end of the case.  The patient was taken to the recovery room in stable condition.  Ruta Hinds, MD [facility] Office: [phone] Pager: [phone]

## 2015-06-15 NOTE — Discharge Instructions (Signed)
° ° °  06/15/2015 JOSHUAJAMES AYDELOTT [id number] 01/19/1981  Surgeon(s): Elam Dutch, MD  Procedure(s): INSERTION OF LEFT UPPER ARM  ARTERIOVENOUS (AV) 26mm x 50cm GORE-TEX GRAFT  x Do not stick graft for 4 weeks

## 2015-06-15 NOTE — Anesthesia Preprocedure Evaluation (Addendum)
Anesthesia Evaluation  Patient identified by MRN, date of birth, ID band Patient awake    Reviewed: Allergy & Precautions, NPO status , Patient's Chart, lab work & pertinent test results, reviewed documented beta blocker date and time   Airway Mallampati: I  TM Distance: >3 FB Neck ROM: Full    Dental   Pulmonary neg pulmonary ROS,    breath sounds clear to auscultation       Cardiovascular hypertension, Pt. on medications and Pt. on home beta blockers +CHF   Rhythm:Regular Rate:Normal  05/2013 Echo: Left ventricle: The cavity size was normal. Wall thickness was normal. Systolic function was mildly reduced. The estimated ejection fraction was in the range of 45% to 50%. Diffuse hypokinesis. Left ventricular diastolic function parameters were normal. - Left atrium: The atrium was mildly dilated.   Neuro/Psych Seizures -,     GI/Hepatic negative GI ROS, Neg liver ROS,   Endo/Other  diabetes, Type 2, Insulin Dependent  Renal/GU ESRF and DialysisRenal disease     Musculoskeletal   Abdominal   Peds  Hematology  (+) anemia ,   Anesthesia Other Findings   Reproductive/Obstetrics                            Lab Results  Component Value Date   WBC 5.9 03/07/2015   HGB 13.6 04/01/2015   HCT 40.0 04/01/2015   MCV 83.2 03/07/2015   PLT 268 03/07/2015   Lab Results  Component Value Date   CREATININE 6.66* 03/07/2015   BUN 25* 03/07/2015   NA 135 04/01/2015   K 4.7 04/01/2015   CL 100* 03/07/2015   CO2 25 03/07/2015    Anesthesia Physical Anesthesia Plan  ASA: III  Anesthesia Plan: General   Post-op Pain Management:    Induction: Intravenous  Airway Management Planned: LMA  Additional Equipment:   Intra-op Plan:   Post-operative Plan: Extubation in OR  Informed Consent: I have reviewed the patients History and Physical, chart, labs and discussed the procedure including  the risks, benefits and alternatives for the proposed anesthesia with the patient or authorized representative who has indicated his/her understanding and acceptance.     Plan Discussed with: CRNA  Anesthesia Plan Comments:         Anesthesia Quick Evaluation

## 2015-06-16 ENCOUNTER — Encounter (HOSPITAL_COMMUNITY): Payer: Self-pay | Admitting: Vascular Surgery

## 2015-06-16 DIAGNOSIS — E1022 Type 1 diabetes mellitus with diabetic chronic kidney disease: Secondary | ICD-10-CM | POA: Diagnosis not present

## 2015-06-16 DIAGNOSIS — N2581 Secondary hyperparathyroidism of renal origin: Secondary | ICD-10-CM | POA: Diagnosis not present

## 2015-06-16 DIAGNOSIS — N186 End stage renal disease: Secondary | ICD-10-CM | POA: Diagnosis not present

## 2015-06-16 DIAGNOSIS — D631 Anemia in chronic kidney disease: Secondary | ICD-10-CM | POA: Diagnosis not present

## 2015-06-16 LAB — POCT I-STAT 4, (NA,K, GLUC, HGB,HCT)
GLUCOSE: 54 mg/dL — AB (ref 65–99)
HCT: 39 % (ref 39.0–52.0)
Hemoglobin: 13.3 g/dL (ref 13.0–17.0)
POTASSIUM: 4.6 mmol/L (ref 3.5–5.1)
SODIUM: 139 mmol/L (ref 135–145)

## 2015-06-16 LAB — GLUCOSE, CAPILLARY
GLUCOSE-CAPILLARY: 75 mg/dL (ref 65–99)
GLUCOSE-CAPILLARY: 92 mg/dL (ref 65–99)
Glucose-Capillary: 100 mg/dL — ABNORMAL HIGH (ref 65–99)
Glucose-Capillary: 153 mg/dL — ABNORMAL HIGH (ref 65–99)
Glucose-Capillary: 136 mg/dL — ABNORMAL HIGH (ref 65–99)
Glucose-Capillary: 198 mg/dL — ABNORMAL HIGH (ref 65–99)

## 2015-06-18 DIAGNOSIS — N186 End stage renal disease: Secondary | ICD-10-CM | POA: Diagnosis not present

## 2015-06-18 DIAGNOSIS — D631 Anemia in chronic kidney disease: Secondary | ICD-10-CM | POA: Diagnosis not present

## 2015-06-18 DIAGNOSIS — E1022 Type 1 diabetes mellitus with diabetic chronic kidney disease: Secondary | ICD-10-CM | POA: Diagnosis not present

## 2015-06-18 DIAGNOSIS — N2581 Secondary hyperparathyroidism of renal origin: Secondary | ICD-10-CM | POA: Diagnosis not present

## 2015-06-20 DIAGNOSIS — N2581 Secondary hyperparathyroidism of renal origin: Secondary | ICD-10-CM | POA: Diagnosis not present

## 2015-06-20 DIAGNOSIS — E1022 Type 1 diabetes mellitus with diabetic chronic kidney disease: Secondary | ICD-10-CM | POA: Diagnosis not present

## 2015-06-20 DIAGNOSIS — N186 End stage renal disease: Secondary | ICD-10-CM | POA: Diagnosis not present

## 2015-06-20 DIAGNOSIS — D631 Anemia in chronic kidney disease: Secondary | ICD-10-CM | POA: Diagnosis not present

## 2015-06-23 DIAGNOSIS — E1022 Type 1 diabetes mellitus with diabetic chronic kidney disease: Secondary | ICD-10-CM | POA: Diagnosis not present

## 2015-06-23 DIAGNOSIS — N186 End stage renal disease: Secondary | ICD-10-CM | POA: Diagnosis not present

## 2015-06-23 DIAGNOSIS — D631 Anemia in chronic kidney disease: Secondary | ICD-10-CM | POA: Diagnosis not present

## 2015-06-23 DIAGNOSIS — N2581 Secondary hyperparathyroidism of renal origin: Secondary | ICD-10-CM | POA: Diagnosis not present

## 2015-06-25 DIAGNOSIS — E1022 Type 1 diabetes mellitus with diabetic chronic kidney disease: Secondary | ICD-10-CM | POA: Diagnosis not present

## 2015-06-25 DIAGNOSIS — D631 Anemia in chronic kidney disease: Secondary | ICD-10-CM | POA: Diagnosis not present

## 2015-06-25 DIAGNOSIS — N2581 Secondary hyperparathyroidism of renal origin: Secondary | ICD-10-CM | POA: Diagnosis not present

## 2015-06-25 DIAGNOSIS — N186 End stage renal disease: Secondary | ICD-10-CM | POA: Diagnosis not present

## 2015-06-27 DIAGNOSIS — E1022 Type 1 diabetes mellitus with diabetic chronic kidney disease: Secondary | ICD-10-CM | POA: Diagnosis not present

## 2015-06-27 DIAGNOSIS — D631 Anemia in chronic kidney disease: Secondary | ICD-10-CM | POA: Diagnosis not present

## 2015-06-27 DIAGNOSIS — N2581 Secondary hyperparathyroidism of renal origin: Secondary | ICD-10-CM | POA: Diagnosis not present

## 2015-06-27 DIAGNOSIS — N186 End stage renal disease: Secondary | ICD-10-CM | POA: Diagnosis not present

## 2015-06-30 ENCOUNTER — Other Ambulatory Visit (HOSPITAL_COMMUNITY): Payer: Self-pay | Admitting: Nephrology

## 2015-06-30 DIAGNOSIS — Z01818 Encounter for other preprocedural examination: Secondary | ICD-10-CM

## 2015-06-30 DIAGNOSIS — N2581 Secondary hyperparathyroidism of renal origin: Secondary | ICD-10-CM | POA: Diagnosis not present

## 2015-06-30 DIAGNOSIS — D631 Anemia in chronic kidney disease: Secondary | ICD-10-CM | POA: Diagnosis not present

## 2015-06-30 DIAGNOSIS — N186 End stage renal disease: Secondary | ICD-10-CM | POA: Diagnosis not present

## 2015-06-30 DIAGNOSIS — E1022 Type 1 diabetes mellitus with diabetic chronic kidney disease: Secondary | ICD-10-CM | POA: Diagnosis not present

## 2015-07-02 DIAGNOSIS — N186 End stage renal disease: Secondary | ICD-10-CM | POA: Diagnosis not present

## 2015-07-02 DIAGNOSIS — D631 Anemia in chronic kidney disease: Secondary | ICD-10-CM | POA: Diagnosis not present

## 2015-07-02 DIAGNOSIS — E1022 Type 1 diabetes mellitus with diabetic chronic kidney disease: Secondary | ICD-10-CM | POA: Diagnosis not present

## 2015-07-02 DIAGNOSIS — E1129 Type 2 diabetes mellitus with other diabetic kidney complication: Secondary | ICD-10-CM | POA: Diagnosis not present

## 2015-07-02 DIAGNOSIS — N2581 Secondary hyperparathyroidism of renal origin: Secondary | ICD-10-CM | POA: Diagnosis not present

## 2015-07-03 ENCOUNTER — Telehealth: Payer: Self-pay | Admitting: Endocrinology

## 2015-07-03 NOTE — Telephone Encounter (Signed)
Dialysis clinic ask if there need to any changes to patient regimine, Patient has been experiencing very painful cramping  all over his body from his ankles up to his chest. Please advise [phone]

## 2015-07-03 NOTE — Telephone Encounter (Signed)
See note below and please advise, Thanks! 

## 2015-07-03 NOTE — Telephone Encounter (Signed)
I contacted the pt and advised of note below. Pt voiced understanding.  

## 2015-07-03 NOTE — Telephone Encounter (Signed)
Pt has appt on Monday. i need to see him to answer this

## 2015-07-05 DIAGNOSIS — N186 End stage renal disease: Secondary | ICD-10-CM | POA: Diagnosis not present

## 2015-07-05 DIAGNOSIS — E1122 Type 2 diabetes mellitus with diabetic chronic kidney disease: Secondary | ICD-10-CM | POA: Diagnosis not present

## 2015-07-05 DIAGNOSIS — Z992 Dependence on renal dialysis: Secondary | ICD-10-CM | POA: Diagnosis not present

## 2015-07-06 ENCOUNTER — Encounter: Payer: Self-pay | Admitting: Endocrinology

## 2015-07-06 ENCOUNTER — Ambulatory Visit (INDEPENDENT_AMBULATORY_CARE_PROVIDER_SITE_OTHER): Payer: Medicare Other | Admitting: Endocrinology

## 2015-07-06 VITALS — BP 153/74 | HR 100 | Temp 98.3°F | Wt 198.0 lb

## 2015-07-06 DIAGNOSIS — N182 Chronic kidney disease, stage 2 (mild): Secondary | ICD-10-CM

## 2015-07-06 DIAGNOSIS — E1122 Type 2 diabetes mellitus with diabetic chronic kidney disease: Secondary | ICD-10-CM

## 2015-07-06 LAB — POCT GLYCOSYLATED HEMOGLOBIN (HGB A1C)

## 2015-07-06 MED ORDER — INSULIN GLARGINE 100 UNIT/ML ~~LOC~~ SOLN: 30.0000 [IU] | Freq: Every day | SUBCUTANEOUS | Status: DC

## 2015-07-06 NOTE — Progress Notes (Signed)
Subjective:    Patient ID: Edward Colon, male    DOB: September 01, 1980, 35 y.o.   MRN: [id number]  HPI Pt returns for f/u of diabetes mellitus: DM type: 1 Dx'ed: 0000000 Complications: retinopathy and ESRD Therapy: insulin since dx DKA: last episode was in approx 2007 Severe hypoglycemia:  last episode was in early 2017 Pancreatitis: never. Other: he takes multiple daily injections; he works on a food truck.    Interval history: He averages approx 7 units total of humalog per day. no cbg record, but states cbg's vary from 60-400's.  There is no trend throughout the day.  He has mild hypoglycemia with activity only, but he is active almost every day.  Past Medical History  Diagnosis Date  . Diabetes mellitus without complication ([place])   . Hypertension   . Chronic kidney disease   . Type 1 diabetes ([place])   . Heart murmur     denies any problems with it  . Seizures ([place])     none for 2 months as of 03/31/15  . Neuropathy ([place])   . Anemia   . Blind one eye     left eye    Past Surgical History  Procedure Laterality Date  . Insertion of dialysis catheter Right 03/03/2015    Procedure: INSERTION OF DIALYSIS CATHETER;  Surgeon: Serafina Mitchell, MD;  Location: [facility];  Service: Vascular;  Laterality: Right;  . Av fistula placement Right 03/03/2015    Procedure: RIGHT RADIO-CEPHALIC ARTERIOVENOUS (AV) FISTULA CREATION;  Surgeon: Serafina Mitchell, MD;  Location: [facility];  Service: Vascular;  Laterality: Right;  . Eye surgery Right     for diabetic retinopathy  . Bascilic vein transposition Left 04/01/2015    Procedure: BASCILIC VEIN TRANSPOSITION-LEFT ARM- FIRST STAGE;  Surgeon: Elam Dutch, MD;  Location: [facility];  Service: Vascular;  Laterality: Left;  . Av fistula placement Left 06/15/2015    Procedure: INSERTION OF LEFT UPPER ARM  ARTERIOVENOUS (AV) 9mm x 50cm GORE-TEX GRAFT;  Surgeon: Elam Dutch, MD;  Location: [facility];  Service: Vascular;  Laterality: Left;    Social History    Social History  . Marital Status: Single    Spouse Name: N/A  . Number of Children: N/A  . Years of Education: N/A   Occupational History  . Not on file.   Social History Main Topics  . Smoking status: Never Smoker   . Smokeless tobacco: Never Used  . Alcohol Use: Yes     Comment: rare  . Drug Use: No  . Sexual Activity: No   Other Topics Concern  . Not on file   Social History Narrative    Current Outpatient Prescriptions on File Prior to Visit  Medication Sig Dispense Refill  . calcium acetate (PHOSLO) 667 MG capsule Take 1 capsule (667 mg total) by mouth 3 (three) times daily with meals. (Patient taking differently: Take 1,334-2,001 mg by mouth See admin instructions. Take 2-3 capsules (1334 mg - 2001 mg) with meals (depending on size of meal) and 1 capsule (667 mg) with snacks) 90 capsule 0  . carvedilol (COREG) 25 MG tablet Take 25 mg by mouth 2 (two) times daily.  5  . glucose blood (ONETOUCH VERIO) test strip 1 each by Other route 4 (four) times daily. Dx Code: E11.9 120 each 2  . hydrALAZINE (APRESOLINE) 100 MG tablet Take 100 mg by mouth 2 (two) times daily.   3  . insulin lispro (HUMALOG) 100 UNIT/ML injection  Inject 2 Units into the skin 3 (three) times daily with meals. Only if blood sugar is over 300    . multivitamin (RENA-VIT) TABS tablet Take 1 tablet by mouth at bedtime. (Patient taking differently: Take 1 tablet by mouth daily. ) 30 tablet 0  . ONETOUCH DELICA LANCETS 99991111 MISC Use check blood sugar 4 times per day. Dx code: E11.9 120 each 2  . oxyCODONE (ROXICODONE) 5 MG immediate release tablet Take 1 tablet (5 mg total) by mouth every 6 (six) hours as needed. 30 tablet 0   No current facility-administered medications on file prior to visit.    No Known Allergies  Family History  Problem Relation Age of Onset  . Diabetes Neg Hx     BP 153/74 mmHg  Pulse 100  Temp(Src) 98.3 F (36.8 C)  Wt 198 lb (89.812 kg)  SpO2 98%   Review of  Systems Denies LOC    Objective:   Physical Exam VITAL SIGNS:  See vs page GENERAL: no distress SKIN:  Insulin injection sites at the anterior abdomen are normal.   A1c=9.4%    Assessment & Plan:  DM: he needs increased rx  Patient is advised the following: Patient Instructions  check your blood sugar 4 times a day: before the 3 meals, and at bedtime.  also check if you have symptoms of your blood sugar being too high or too low.  please keep a record of the readings and bring it to your next appointment here (or you can bring the meter itself).  You can write it on any piece of paper.  please call us sooner if your blood sugar goes below 70, or if you have a lot of readings over 200. Please increase lantus to 30 units per day.  Take humalog, just 2 units for any blood sugar over 300.   On this type of insulin schedule, you should eat meals on a regular schedule.  If a meal is missed or significantly delayed, your blood sugar could go low.  You should carry a non-perishable snack, just in case.   Please come back for a follow-up appointment in 1 month.

## 2015-07-06 NOTE — Patient Instructions (Addendum)
check your blood sugar 4 times a day: before the 3 meals, and at bedtime.  also check if you have symptoms of your blood sugar being too high or too low.  please keep a record of the readings and bring it to your next appointment here (or you can bring the meter itself).  You can write it on any piece of paper.  please call us sooner if your blood sugar goes below 70, or if you have a lot of readings over 200. Please increase lantus to 30 units per day.  Take humalog, just 2 units for any blood sugar over 300.   On this type of insulin schedule, you should eat meals on a regular schedule.  If a meal is missed or significantly delayed, your blood sugar could go low.  You should carry a non-perishable snack, just in case.   Please come back for a follow-up appointment in 1 month.

## 2015-07-07 DIAGNOSIS — N2581 Secondary hyperparathyroidism of renal origin: Secondary | ICD-10-CM | POA: Diagnosis not present

## 2015-07-07 DIAGNOSIS — N186 End stage renal disease: Secondary | ICD-10-CM | POA: Diagnosis not present

## 2015-07-07 DIAGNOSIS — D631 Anemia in chronic kidney disease: Secondary | ICD-10-CM | POA: Diagnosis not present

## 2015-07-07 DIAGNOSIS — D508 Other iron deficiency anemias: Secondary | ICD-10-CM | POA: Diagnosis not present

## 2015-07-07 DIAGNOSIS — E1022 Type 1 diabetes mellitus with diabetic chronic kidney disease: Secondary | ICD-10-CM | POA: Diagnosis not present

## 2015-07-09 ENCOUNTER — Telehealth (HOSPITAL_COMMUNITY): Payer: Self-pay | Admitting: *Deleted

## 2015-07-09 DIAGNOSIS — D631 Anemia in chronic kidney disease: Secondary | ICD-10-CM | POA: Diagnosis not present

## 2015-07-09 DIAGNOSIS — N2581 Secondary hyperparathyroidism of renal origin: Secondary | ICD-10-CM | POA: Diagnosis not present

## 2015-07-09 DIAGNOSIS — E1022 Type 1 diabetes mellitus with diabetic chronic kidney disease: Secondary | ICD-10-CM | POA: Diagnosis not present

## 2015-07-09 DIAGNOSIS — N186 End stage renal disease: Secondary | ICD-10-CM | POA: Diagnosis not present

## 2015-07-09 DIAGNOSIS — D508 Other iron deficiency anemias: Secondary | ICD-10-CM | POA: Diagnosis not present

## 2015-07-09 NOTE — Telephone Encounter (Signed)
Patient given detailed instructions per Stress Test Requisition Sheet for test on 07/13/15 at 7:30.Patient Notified to arrive 30 minutes early, and that it is imperative to arrive on time for appointment to keep from having the test rescheduled.  Patient verbalized understanding. Edward Colon

## 2015-07-11 DIAGNOSIS — D631 Anemia in chronic kidney disease: Secondary | ICD-10-CM | POA: Diagnosis not present

## 2015-07-11 DIAGNOSIS — N186 End stage renal disease: Secondary | ICD-10-CM | POA: Diagnosis not present

## 2015-07-11 DIAGNOSIS — N2581 Secondary hyperparathyroidism of renal origin: Secondary | ICD-10-CM | POA: Diagnosis not present

## 2015-07-11 DIAGNOSIS — E1022 Type 1 diabetes mellitus with diabetic chronic kidney disease: Secondary | ICD-10-CM | POA: Diagnosis not present

## 2015-07-11 DIAGNOSIS — D508 Other iron deficiency anemias: Secondary | ICD-10-CM | POA: Diagnosis not present

## 2015-07-13 ENCOUNTER — Ambulatory Visit (HOSPITAL_COMMUNITY): Payer: Medicare Other | Attending: Internal Medicine

## 2015-07-13 ENCOUNTER — Ambulatory Visit (HOSPITAL_BASED_OUTPATIENT_CLINIC_OR_DEPARTMENT_OTHER): Payer: Medicare Other

## 2015-07-13 DIAGNOSIS — Z01810 Encounter for preprocedural cardiovascular examination: Secondary | ICD-10-CM | POA: Diagnosis not present

## 2015-07-13 DIAGNOSIS — Z01818 Encounter for other preprocedural examination: Secondary | ICD-10-CM

## 2015-07-13 DIAGNOSIS — I120 Hypertensive chronic kidney disease with stage 5 chronic kidney disease or end stage renal disease: Secondary | ICD-10-CM | POA: Insufficient documentation

## 2015-07-13 DIAGNOSIS — E1122 Type 2 diabetes mellitus with diabetic chronic kidney disease: Secondary | ICD-10-CM | POA: Insufficient documentation

## 2015-07-13 DIAGNOSIS — N186 End stage renal disease: Secondary | ICD-10-CM | POA: Diagnosis not present

## 2015-07-13 DIAGNOSIS — R0989 Other specified symptoms and signs involving the circulatory and respiratory systems: Secondary | ICD-10-CM

## 2015-07-14 DIAGNOSIS — E1022 Type 1 diabetes mellitus with diabetic chronic kidney disease: Secondary | ICD-10-CM | POA: Diagnosis not present

## 2015-07-14 DIAGNOSIS — D631 Anemia in chronic kidney disease: Secondary | ICD-10-CM | POA: Diagnosis not present

## 2015-07-14 DIAGNOSIS — N2581 Secondary hyperparathyroidism of renal origin: Secondary | ICD-10-CM | POA: Diagnosis not present

## 2015-07-14 DIAGNOSIS — N186 End stage renal disease: Secondary | ICD-10-CM | POA: Diagnosis not present

## 2015-07-14 DIAGNOSIS — D508 Other iron deficiency anemias: Secondary | ICD-10-CM | POA: Diagnosis not present

## 2015-07-16 DIAGNOSIS — N186 End stage renal disease: Secondary | ICD-10-CM | POA: Diagnosis not present

## 2015-07-16 DIAGNOSIS — N2581 Secondary hyperparathyroidism of renal origin: Secondary | ICD-10-CM | POA: Diagnosis not present

## 2015-07-16 DIAGNOSIS — E1022 Type 1 diabetes mellitus with diabetic chronic kidney disease: Secondary | ICD-10-CM | POA: Diagnosis not present

## 2015-07-16 DIAGNOSIS — D508 Other iron deficiency anemias: Secondary | ICD-10-CM | POA: Diagnosis not present

## 2015-07-16 DIAGNOSIS — D631 Anemia in chronic kidney disease: Secondary | ICD-10-CM | POA: Diagnosis not present

## 2015-07-18 DIAGNOSIS — E1022 Type 1 diabetes mellitus with diabetic chronic kidney disease: Secondary | ICD-10-CM | POA: Diagnosis not present

## 2015-07-18 DIAGNOSIS — N2581 Secondary hyperparathyroidism of renal origin: Secondary | ICD-10-CM | POA: Diagnosis not present

## 2015-07-18 DIAGNOSIS — N186 End stage renal disease: Secondary | ICD-10-CM | POA: Diagnosis not present

## 2015-07-18 DIAGNOSIS — D508 Other iron deficiency anemias: Secondary | ICD-10-CM | POA: Diagnosis not present

## 2015-07-18 DIAGNOSIS — D631 Anemia in chronic kidney disease: Secondary | ICD-10-CM | POA: Diagnosis not present

## 2015-07-21 DIAGNOSIS — E1022 Type 1 diabetes mellitus with diabetic chronic kidney disease: Secondary | ICD-10-CM | POA: Diagnosis not present

## 2015-07-21 DIAGNOSIS — D631 Anemia in chronic kidney disease: Secondary | ICD-10-CM | POA: Diagnosis not present

## 2015-07-21 DIAGNOSIS — D508 Other iron deficiency anemias: Secondary | ICD-10-CM | POA: Diagnosis not present

## 2015-07-21 DIAGNOSIS — N186 End stage renal disease: Secondary | ICD-10-CM | POA: Diagnosis not present

## 2015-07-21 DIAGNOSIS — N2581 Secondary hyperparathyroidism of renal origin: Secondary | ICD-10-CM | POA: Diagnosis not present

## 2015-07-23 DIAGNOSIS — E1022 Type 1 diabetes mellitus with diabetic chronic kidney disease: Secondary | ICD-10-CM | POA: Diagnosis not present

## 2015-07-23 DIAGNOSIS — N186 End stage renal disease: Secondary | ICD-10-CM | POA: Diagnosis not present

## 2015-07-23 DIAGNOSIS — D631 Anemia in chronic kidney disease: Secondary | ICD-10-CM | POA: Diagnosis not present

## 2015-07-23 DIAGNOSIS — D508 Other iron deficiency anemias: Secondary | ICD-10-CM | POA: Diagnosis not present

## 2015-07-23 DIAGNOSIS — N2581 Secondary hyperparathyroidism of renal origin: Secondary | ICD-10-CM | POA: Diagnosis not present

## 2015-07-28 DIAGNOSIS — N186 End stage renal disease: Secondary | ICD-10-CM | POA: Diagnosis not present

## 2015-07-28 DIAGNOSIS — D508 Other iron deficiency anemias: Secondary | ICD-10-CM | POA: Diagnosis not present

## 2015-07-28 DIAGNOSIS — D631 Anemia in chronic kidney disease: Secondary | ICD-10-CM | POA: Diagnosis not present

## 2015-07-28 DIAGNOSIS — E1022 Type 1 diabetes mellitus with diabetic chronic kidney disease: Secondary | ICD-10-CM | POA: Diagnosis not present

## 2015-07-28 DIAGNOSIS — N2581 Secondary hyperparathyroidism of renal origin: Secondary | ICD-10-CM | POA: Diagnosis not present

## 2015-07-29 DIAGNOSIS — N186 End stage renal disease: Secondary | ICD-10-CM | POA: Diagnosis not present

## 2015-07-29 DIAGNOSIS — Z452 Encounter for adjustment and management of vascular access device: Secondary | ICD-10-CM | POA: Diagnosis not present

## 2015-07-29 DIAGNOSIS — Z992 Dependence on renal dialysis: Secondary | ICD-10-CM | POA: Diagnosis not present

## 2015-07-30 DIAGNOSIS — D631 Anemia in chronic kidney disease: Secondary | ICD-10-CM | POA: Diagnosis not present

## 2015-07-30 DIAGNOSIS — N186 End stage renal disease: Secondary | ICD-10-CM | POA: Diagnosis not present

## 2015-07-30 DIAGNOSIS — E1022 Type 1 diabetes mellitus with diabetic chronic kidney disease: Secondary | ICD-10-CM | POA: Diagnosis not present

## 2015-07-30 DIAGNOSIS — D508 Other iron deficiency anemias: Secondary | ICD-10-CM | POA: Diagnosis not present

## 2015-07-30 DIAGNOSIS — N2581 Secondary hyperparathyroidism of renal origin: Secondary | ICD-10-CM | POA: Diagnosis not present

## 2015-08-01 DIAGNOSIS — E1022 Type 1 diabetes mellitus with diabetic chronic kidney disease: Secondary | ICD-10-CM | POA: Diagnosis not present

## 2015-08-01 DIAGNOSIS — N186 End stage renal disease: Secondary | ICD-10-CM | POA: Diagnosis not present

## 2015-08-01 DIAGNOSIS — D508 Other iron deficiency anemias: Secondary | ICD-10-CM | POA: Diagnosis not present

## 2015-08-01 DIAGNOSIS — N2581 Secondary hyperparathyroidism of renal origin: Secondary | ICD-10-CM | POA: Diagnosis not present

## 2015-08-01 DIAGNOSIS — D631 Anemia in chronic kidney disease: Secondary | ICD-10-CM | POA: Diagnosis not present

## 2015-08-04 DIAGNOSIS — E1022 Type 1 diabetes mellitus with diabetic chronic kidney disease: Secondary | ICD-10-CM | POA: Diagnosis not present

## 2015-08-04 DIAGNOSIS — D631 Anemia in chronic kidney disease: Secondary | ICD-10-CM | POA: Diagnosis not present

## 2015-08-04 DIAGNOSIS — N186 End stage renal disease: Secondary | ICD-10-CM | POA: Diagnosis not present

## 2015-08-04 DIAGNOSIS — N2581 Secondary hyperparathyroidism of renal origin: Secondary | ICD-10-CM | POA: Diagnosis not present

## 2015-08-04 DIAGNOSIS — D508 Other iron deficiency anemias: Secondary | ICD-10-CM | POA: Diagnosis not present

## 2015-08-05 DIAGNOSIS — N186 End stage renal disease: Secondary | ICD-10-CM | POA: Diagnosis not present

## 2015-08-05 DIAGNOSIS — Z992 Dependence on renal dialysis: Secondary | ICD-10-CM | POA: Diagnosis not present

## 2015-08-05 DIAGNOSIS — E1122 Type 2 diabetes mellitus with diabetic chronic kidney disease: Secondary | ICD-10-CM | POA: Diagnosis not present

## 2015-08-06 DIAGNOSIS — N186 End stage renal disease: Secondary | ICD-10-CM | POA: Diagnosis not present

## 2015-08-06 DIAGNOSIS — D508 Other iron deficiency anemias: Secondary | ICD-10-CM | POA: Diagnosis not present

## 2015-08-06 DIAGNOSIS — N2581 Secondary hyperparathyroidism of renal origin: Secondary | ICD-10-CM | POA: Diagnosis not present

## 2015-08-06 DIAGNOSIS — E1022 Type 1 diabetes mellitus with diabetic chronic kidney disease: Secondary | ICD-10-CM | POA: Diagnosis not present

## 2015-08-06 DIAGNOSIS — D631 Anemia in chronic kidney disease: Secondary | ICD-10-CM | POA: Diagnosis not present

## 2015-08-07 ENCOUNTER — Ambulatory Visit (INDEPENDENT_AMBULATORY_CARE_PROVIDER_SITE_OTHER): Payer: Medicare Other | Admitting: Endocrinology

## 2015-08-07 ENCOUNTER — Encounter: Payer: Self-pay | Admitting: Endocrinology

## 2015-08-07 VITALS — BP 112/60 | HR 113 | Temp 97.8°F | Ht 71.0 in | Wt 191.0 lb

## 2015-08-07 DIAGNOSIS — Z992 Dependence on renal dialysis: Secondary | ICD-10-CM | POA: Diagnosis not present

## 2015-08-07 DIAGNOSIS — E1022 Type 1 diabetes mellitus with diabetic chronic kidney disease: Secondary | ICD-10-CM

## 2015-08-07 DIAGNOSIS — N186 End stage renal disease: Secondary | ICD-10-CM | POA: Diagnosis not present

## 2015-08-07 NOTE — Progress Notes (Signed)
Subjective:    Patient ID: Edward Colon, male    DOB: [birth year], 35 y.o.   MRN: [id number]  HPI DM type: 1 Dx'ed: 0000000 Complications: retinopathy and ESRD Therapy: insulin since dx DKA: last episode was in approx 2007 Severe hypoglycemia:  last episode was in early 2017 Pancreatitis: never. Other: he takes qd insulin, after poor results with multiple daily injections; he works on a food truck.    Interval history: He averages approx 7 units total of humalog per day. no cbg record, but states cbg's vary from 66-400's.  It is in general higher as the day goes on.  He has mild hypoglycemia mostly with activity, but he is active almost every day.   Past Medical History  Diagnosis Date  . Diabetes mellitus without complication ([place])   . Hypertension   . Chronic kidney disease   . Type 1 diabetes ([place])   . Heart murmur     denies any problems with it  . Seizures ([place])     none for 2 months as of 03/31/15  . Neuropathy ([place])   . Anemia   . Blind one eye     left eye    Past Surgical History  Procedure Laterality Date  . Insertion of dialysis catheter Right 03/03/2015    Procedure: INSERTION OF DIALYSIS CATHETER;  Surgeon: Serafina Mitchell, MD;  Location: [facility];  Service: Vascular;  Laterality: Right;  . Av fistula placement Right 03/03/2015    Procedure: RIGHT RADIO-CEPHALIC ARTERIOVENOUS (AV) FISTULA CREATION;  Surgeon: Serafina Mitchell, MD;  Location: [facility];  Service: Vascular;  Laterality: Right;  . Eye surgery Right     for diabetic retinopathy  . Bascilic vein transposition Left 04/01/2015    Procedure: BASCILIC VEIN TRANSPOSITION-LEFT ARM- FIRST STAGE;  Surgeon: Elam Dutch, MD;  Location: [facility];  Service: Vascular;  Laterality: Left;  . Av fistula placement Left 06/15/2015    Procedure: INSERTION OF LEFT UPPER ARM  ARTERIOVENOUS (AV) 6mm x 50cm GORE-TEX GRAFT;  Surgeon: Elam Dutch, MD;  Location: [facility] OR;  Service: Vascular;  Laterality: Left;    Social  History   Social History  . Marital Status: Single    Spouse Name: N/A  . Number of Children: N/A  . Years of Education: N/A   Occupational History  . Not on file.   Social History Main Topics  . Smoking status: Never Smoker   . Smokeless tobacco: Never Used  . Alcohol Use: Yes     Comment: rare  . Drug Use: No  . Sexual Activity: No   Other Topics Concern  . Not on file   Social History Narrative    Current Outpatient Prescriptions on File Prior to Visit  Medication Sig Dispense Refill  . calcium acetate (PHOSLO) 667 MG capsule Take 1 capsule (667 mg total) by mouth 3 (three) times daily with meals. (Patient taking differently: Take 1,334-2,001 mg by mouth See admin instructions. Take 2-3 capsules (1334 mg - 2001 mg) with meals (depending on size of meal) and 1 capsule (667 mg) with snacks) 90 capsule 0  . carvedilol (COREG) 25 MG tablet Take 25 mg by mouth 2 (two) times daily.  5  . glucose blood (ONETOUCH VERIO) test strip 1 each by Other route 4 (four) times daily. Dx Code: E11.9 120 each 2  . hydrALAZINE (APRESOLINE) 100 MG tablet Take 100 mg by mouth 2 (two) times daily.   3  . insulin glargine (LANTUS)  100 UNIT/ML injection Inject 0.3 mLs (30 Units total) into the skin daily before breakfast. 10 mL 0  . insulin lispro (HUMALOG) 100 UNIT/ML injection Inject 2 Units into the skin 3 (three) times daily with meals. Only if blood sugar is over 300    . multivitamin (RENA-VIT) TABS tablet Take 1 tablet by mouth at bedtime. (Patient taking differently: Take 1 tablet by mouth daily. ) 30 tablet 0  . ONETOUCH DELICA LANCETS 99991111 MISC Use check blood sugar 4 times per day. Dx code: E11.9 120 each 2  . oxyCODONE (ROXICODONE) 5 MG immediate release tablet Take 1 tablet (5 mg total) by mouth every 6 (six) hours as needed. 30 tablet 0   No current facility-administered medications on file prior to visit.    No Known Allergies  Family History  Problem Relation Age of Onset  .  Diabetes Neg Hx     BP 112/60 mmHg  Pulse 113  Temp(Src) 97.8 F (36.6 C) (Oral)  Ht 5\' 11"  (1.803 m)  Wt 191 lb (86.637 kg)  BMI 26.65 kg/m2  SpO2 97%   Review of Systems He denies LOC.     Objective:   Physical Exam VITAL SIGNS:  See vs page GENERAL: no distress Pulses: dorsalis pedis intact bilat.   MSK: no deformity of the feet CV: no leg edema.  Skin:  no ulcer on the feet.  normal color and temp on the feet. Neuro: sensation is intact to touch on the feet.    Fructosamine=466     Assessment & Plan:  Insulin-requiring type 2 DM, worse: The pattern of his cbg's indicates he needs a faster-acting QD insulin.  change lantus to NPH, 35 units qam. ESRD: in this setting, he is at risk for hypoglycemia, especially in the fasting state.    Patient is advised the following: Patient Instructions  check your blood sugar 4 times a day: before the 3 meals, and at bedtime.  also check if you have symptoms of your blood sugar being too high or too low.  please keep a record of the readings and bring it to your next appointment here (or you can bring the meter itself).  You can write it on any piece of paper.  please call us sooner if your blood sugar goes below 70, or if you have a lot of readings over 200. Please continue lantus, 30 units per day.   Take humalog, just 2 units for any blood sugar over 300.   blood tests are requested for you today.  We'll let you know about the results. On this type of insulin schedule, you should eat meals on a regular schedule.  If a meal is missed or significantly delayed, your blood sugar could go low.  You should carry a non-perishable snack, just in case.   Please come back for a follow-up appointment in 3 months     Renato Shin, MD

## 2015-08-07 NOTE — Patient Instructions (Addendum)
check your blood sugar 4 times a day: before the 3 meals, and at bedtime.  also check if you have symptoms of your blood sugar being too high or too low.  please keep a record of the readings and bring it to your next appointment here (or you can bring the meter itself).  You can write it on any piece of paper.  please call us sooner if your blood sugar goes below 70, or if you have a lot of readings over 200. Please continue lantus, 30 units per day.   Take humalog, just 2 units for any blood sugar over 300.   blood tests are requested for you today.  We'll let you know about the results. On this type of insulin schedule, you should eat meals on a regular schedule.  If a meal is missed or significantly delayed, your blood sugar could go low.  You should carry a non-perishable snack, just in case.   Please come back for a follow-up appointment in 3 months

## 2015-08-08 DIAGNOSIS — D631 Anemia in chronic kidney disease: Secondary | ICD-10-CM | POA: Diagnosis not present

## 2015-08-08 DIAGNOSIS — E1022 Type 1 diabetes mellitus with diabetic chronic kidney disease: Secondary | ICD-10-CM | POA: Diagnosis not present

## 2015-08-08 DIAGNOSIS — N186 End stage renal disease: Secondary | ICD-10-CM | POA: Diagnosis not present

## 2015-08-08 DIAGNOSIS — N2581 Secondary hyperparathyroidism of renal origin: Secondary | ICD-10-CM | POA: Diagnosis not present

## 2015-08-08 DIAGNOSIS — D508 Other iron deficiency anemias: Secondary | ICD-10-CM | POA: Diagnosis not present

## 2015-08-08 LAB — FRUCTOSAMINE: Fructosamine: 466 umol/L — ABNORMAL HIGH (ref 0–285)

## 2015-08-08 MED ORDER — INSULIN GLARGINE 100 UNIT/ML ~~LOC~~ SOLN: 35.0000 [IU] | Freq: Every day | SUBCUTANEOUS | Status: DC

## 2015-08-08 MED ORDER — INSULIN NPH (HUMAN) (ISOPHANE) 100 UNIT/ML ~~LOC~~ SUSP: 35.0000 [IU] | SUBCUTANEOUS | Status: DC

## 2015-08-11 ENCOUNTER — Other Ambulatory Visit: Payer: Self-pay | Admitting: Endocrinology

## 2015-08-11 DIAGNOSIS — D508 Other iron deficiency anemias: Secondary | ICD-10-CM | POA: Diagnosis not present

## 2015-08-11 DIAGNOSIS — N186 End stage renal disease: Secondary | ICD-10-CM | POA: Diagnosis not present

## 2015-08-11 DIAGNOSIS — Z992 Dependence on renal dialysis: Secondary | ICD-10-CM | POA: Diagnosis not present

## 2015-08-11 DIAGNOSIS — D631 Anemia in chronic kidney disease: Secondary | ICD-10-CM | POA: Diagnosis not present

## 2015-08-11 DIAGNOSIS — I871 Compression of vein: Secondary | ICD-10-CM | POA: Diagnosis not present

## 2015-08-11 DIAGNOSIS — T82858D Stenosis of vascular prosthetic devices, implants and grafts, subsequent encounter: Secondary | ICD-10-CM | POA: Diagnosis not present

## 2015-08-11 DIAGNOSIS — E1022 Type 1 diabetes mellitus with diabetic chronic kidney disease: Secondary | ICD-10-CM | POA: Diagnosis not present

## 2015-08-11 DIAGNOSIS — N2581 Secondary hyperparathyroidism of renal origin: Secondary | ICD-10-CM | POA: Diagnosis not present

## 2015-08-11 NOTE — Telephone Encounter (Signed)
Please advise if ok to refill. Rx is not on current medication list.

## 2015-08-13 DIAGNOSIS — D508 Other iron deficiency anemias: Secondary | ICD-10-CM | POA: Diagnosis not present

## 2015-08-13 DIAGNOSIS — N186 End stage renal disease: Secondary | ICD-10-CM | POA: Diagnosis not present

## 2015-08-13 DIAGNOSIS — E1022 Type 1 diabetes mellitus with diabetic chronic kidney disease: Secondary | ICD-10-CM | POA: Diagnosis not present

## 2015-08-13 DIAGNOSIS — D631 Anemia in chronic kidney disease: Secondary | ICD-10-CM | POA: Diagnosis not present

## 2015-08-13 DIAGNOSIS — N2581 Secondary hyperparathyroidism of renal origin: Secondary | ICD-10-CM | POA: Diagnosis not present

## 2015-08-15 DIAGNOSIS — E1022 Type 1 diabetes mellitus with diabetic chronic kidney disease: Secondary | ICD-10-CM | POA: Diagnosis not present

## 2015-08-15 DIAGNOSIS — D508 Other iron deficiency anemias: Secondary | ICD-10-CM | POA: Diagnosis not present

## 2015-08-15 DIAGNOSIS — D631 Anemia in chronic kidney disease: Secondary | ICD-10-CM | POA: Diagnosis not present

## 2015-08-15 DIAGNOSIS — N2581 Secondary hyperparathyroidism of renal origin: Secondary | ICD-10-CM | POA: Diagnosis not present

## 2015-08-15 DIAGNOSIS — N186 End stage renal disease: Secondary | ICD-10-CM | POA: Diagnosis not present

## 2015-08-18 DIAGNOSIS — N186 End stage renal disease: Secondary | ICD-10-CM | POA: Diagnosis not present

## 2015-08-18 DIAGNOSIS — D631 Anemia in chronic kidney disease: Secondary | ICD-10-CM | POA: Diagnosis not present

## 2015-08-18 DIAGNOSIS — D508 Other iron deficiency anemias: Secondary | ICD-10-CM | POA: Diagnosis not present

## 2015-08-18 DIAGNOSIS — N2581 Secondary hyperparathyroidism of renal origin: Secondary | ICD-10-CM | POA: Diagnosis not present

## 2015-08-18 DIAGNOSIS — E1022 Type 1 diabetes mellitus with diabetic chronic kidney disease: Secondary | ICD-10-CM | POA: Diagnosis not present

## 2015-08-20 DIAGNOSIS — N2581 Secondary hyperparathyroidism of renal origin: Secondary | ICD-10-CM | POA: Diagnosis not present

## 2015-08-20 DIAGNOSIS — D508 Other iron deficiency anemias: Secondary | ICD-10-CM | POA: Diagnosis not present

## 2015-08-20 DIAGNOSIS — D631 Anemia in chronic kidney disease: Secondary | ICD-10-CM | POA: Diagnosis not present

## 2015-08-20 DIAGNOSIS — N186 End stage renal disease: Secondary | ICD-10-CM | POA: Diagnosis not present

## 2015-08-20 DIAGNOSIS — E1022 Type 1 diabetes mellitus with diabetic chronic kidney disease: Secondary | ICD-10-CM | POA: Diagnosis not present

## 2015-08-21 ENCOUNTER — Emergency Department (HOSPITAL_COMMUNITY)
Admission: EM | Admit: 2015-08-21 | Discharge: 2015-08-21 | Disposition: A | Discharge status: Home / Self Care | Payer: Medicare Other | Attending: Emergency Medicine | Admitting: Emergency Medicine

## 2015-08-21 ENCOUNTER — Encounter (HOSPITAL_COMMUNITY): Payer: Self-pay | Admitting: Emergency Medicine

## 2015-08-21 DIAGNOSIS — R202 Paresthesia of skin: Secondary | ICD-10-CM

## 2015-08-21 DIAGNOSIS — E1022 Type 1 diabetes mellitus with diabetic chronic kidney disease: Secondary | ICD-10-CM | POA: Diagnosis not present

## 2015-08-21 DIAGNOSIS — I120 Hypertensive chronic kidney disease with stage 5 chronic kidney disease or end stage renal disease: Secondary | ICD-10-CM | POA: Insufficient documentation

## 2015-08-21 DIAGNOSIS — Z79899 Other long term (current) drug therapy: Secondary | ICD-10-CM | POA: Insufficient documentation

## 2015-08-21 DIAGNOSIS — N186 End stage renal disease: Secondary | ICD-10-CM | POA: Insufficient documentation

## 2015-08-21 DIAGNOSIS — Z992 Dependence on renal dialysis: Secondary | ICD-10-CM | POA: Diagnosis not present

## 2015-08-21 DIAGNOSIS — Z794 Long term (current) use of insulin: Secondary | ICD-10-CM | POA: Diagnosis not present

## 2015-08-21 DIAGNOSIS — R200 Anesthesia of skin: Secondary | ICD-10-CM | POA: Diagnosis present

## 2015-08-21 NOTE — ED Notes (Signed)
MD at bedside. 

## 2015-08-21 NOTE — ED Provider Notes (Signed)
CSN: [id number]     Arrival date & time 08/21/15  1046 History   First MD Initiated Contact with Patient 08/21/15 1243     Chief Complaint  Patient presents with  . hand numbness      (Consider location/radiation/quality/duration/timing/severity/associated sxs/prior Treatment) HPI Comments: Patient is a 35 year old male with a history of end-stage renal disease on dialysis, type 1 diabetes, hypertension presenting today with decreased sensation in his left fifth digit after complaining of course of dialysis yesterday. Patient states this is happened one other time last week for a proximally 45 minutes after having his graft declotted. It is been feeling normal until yesterday. There were no issues during dialysis and he completed a full course without incident. Since that time he's had waxing and waning numbness but has been persistent. He denies any color change of his hand, pain or swelling. The numbness extends to the wrist and then stops. Occasionally he will feel slightly numb in his fourth digit but most consistently in the fifth. He has no pain or swelling over his graft site.  He cannot recall if there is a position of the arm that makes the symptoms worse  The history is provided by the patient.    Past Medical History  Diagnosis Date  . Diabetes mellitus without complication ([place])   . Hypertension   . Chronic kidney disease   . Type 1 diabetes ([place])   . Heart murmur     denies any problems with it  . Seizures ([place])     none for 2 months as of 03/31/15  . Neuropathy ([place])   . Anemia   . Blind one eye     left eye   Past Surgical History  Procedure Laterality Date  . Insertion of dialysis catheter Right 03/03/2015    Procedure: INSERTION OF DIALYSIS CATHETER;  Surgeon: Serafina Mitchell, MD;  Location: [facility];  Service: Vascular;  Laterality: Right;  . Av fistula placement Right 03/03/2015    Procedure: RIGHT RADIO-CEPHALIC ARTERIOVENOUS (AV) FISTULA CREATION;  Surgeon: Serafina Mitchell, MD;  Location: [facility];  Service: Vascular;  Laterality: Right;  . Eye surgery Right     for diabetic retinopathy  . Bascilic vein transposition Left 04/01/2015    Procedure: BASCILIC VEIN TRANSPOSITION-LEFT ARM- FIRST STAGE;  Surgeon: Elam Dutch, MD;  Location: [facility];  Service: Vascular;  Laterality: Left;  . Av fistula placement Left 06/15/2015    Procedure: INSERTION OF LEFT UPPER ARM  ARTERIOVENOUS (AV) 28mm x 50cm GORE-TEX GRAFT;  Surgeon: Elam Dutch, MD;  Location: [facility] OR;  Service: Vascular;  Laterality: Left;   Family History  Problem Relation Age of Onset  . Diabetes Neg Hx    Social History  Substance Use Topics  . Smoking status: Never Smoker   . Smokeless tobacco: Never Used  . Alcohol Use: Yes     Comment: rare    Review of Systems  All other systems reviewed and are negative.     Allergies  Review of patient's allergies indicates no known allergies.  Home Medications   Prior to Admission medications   Medication Sig Start Date End Date Taking? Authorizing Provider  calcium acetate (PHOSLO) 667 MG capsule Take 1 capsule (667 mg total) by mouth 3 (three) times daily with meals. Patient taking differently: Take 2,001 mg by mouth 3 (three) times daily with meals. Take 2-3 capsules (1334 mg - 2001 mg) with meals (depending on size of meal) and 1 capsule (  667 mg) with snacks 03/07/15  Yes [name], MD  carvedilol (COREG) 25 MG tablet Take 25 mg by mouth 2 (two) times daily. 10/07/14  Yes Historical Provider, MD  hydrALAZINE (APRESOLINE) 100 MG tablet Take 100 mg by mouth 2 (two) times daily.  03/09/15  Yes Historical Provider, MD  insulin lispro (HUMALOG) 100 UNIT/ML injection Inject 2 Units into the skin 3 (three) times daily with meals. Uses sliding scale   Yes Historical Provider, MD  LANTUS 100 UNIT/ML injection INJECT 30 UNITS INTO THE SKIN EVERY DAY BEFORE BREAKFAST 08/11/15  Yes Renato Shin, MD  multivitamin (RENA-VIT) TABS tablet Take 1 tablet  by mouth at bedtime. Patient taking differently: Take 1 tablet by mouth daily.  03/07/15  Yes [name], MD  glucose blood (ONETOUCH VERIO) test strip 1 each by Other route 4 (four) times daily. Dx Code: E11.9 06/10/15   Renato Shin, MD  insulin NPH Human (HUMULIN N) 100 UNIT/ML injection Inject 0.35 mLs (35 Units total) into the skin every morning. Patient not taking: Reported on 08/21/2015 08/08/15   Renato Shin, MD  [facility] DELICA LANCETS 99991111 MISC Use check blood sugar 4 times per day. Dx code: E11.9 06/10/15   Renato Shin, MD  oxyCODONE (ROXICODONE) 5 MG immediate release tablet Take 1 tablet (5 mg total) by mouth every 6 (six) hours as needed. Patient not taking: Reported on 08/21/2015 06/15/15   Samantha J Rhyne, PA-C   BP 130/76 mmHg  Pulse 83  Temp(Src) 98.1 F (36.7 C) (Oral)  Resp 17  SpO2 100% Physical Exam  Constitutional: He is oriented to person, place, and time. He appears well-developed and well-nourished. No distress.  Cardiovascular: Normal rate, regular rhythm and intact distal pulses.   Murmur heard. 2+ left radial pulse  Pulmonary/Chest: Effort normal. No respiratory distress. He has no wheezes. He has no rales.  Musculoskeletal: He exhibits no edema or tenderness.       Arms: Neurological: He is alert and oriented to person, place, and time. He has normal strength. A sensory deficit is present.  Mild decreased sensation in the left 5th digit.  Normal strength and hand grip.  Normal flexion/ext of the left wrist  Skin: Skin is warm and dry. No rash noted. No erythema.  Psychiatric: He has a normal mood and affect. His behavior is normal.  Nursing note and vitals reviewed.   ED Course  Procedures (including critical care time) Labs Review Labs Reviewed - No data to display  Imaging Review No results found. I have personally reviewed and evaluated these images and lab results as part of my medical decision-making.   EKG Interpretation None      MDM    Final diagnoses:  Left hand paresthesia    Patient is a 35 year old end-stage renal patient currently on dialysis presenting with numbness in his left fifth digit since dialysis yesterday. It waxes and wanes in severity but has not completely resolved. He denies any weakness or pain in that hand. This is only happened one other time for only 45 minutes after getting his graft declotted.  He has no other complaints at this time. He has no shortness of breath, chest pain and is well-appearing. He had a full course of dialysis yesterday without incident. On exam patient has a good thrill in his graft with no tenderness or swelling at or distal to the graft. He has normal sensation of digits 1 through 4 with decreased sensation completely over the fifth digit. He is able  to completely flex and extend the wrist without difficulty normal strength and handgrip.   Will discuss with vascular surgery to make sure no immediate issues.  Dr. Donnetta Hutching recommended that pt follow up with Dr. Augustin Coupe at the dialysis center.  Felt could be small embolic event in the palmar arch but at this time no further intervention.  If sx continue follow up.    Blanchie Dessert, MD 08/22/15 [phone]

## 2015-08-21 NOTE — ED Notes (Addendum)
Pt c/o left hand numbness, specifically the pinky finger. Same arm has a fistula graft, strong radial pulse, cap refill less than 3 seconds. Pt states he can feel each finger, but pinky finger feels different than the others. Pt reports his graft was declotted about a week ago. Pt does have a hx of neuropathy.

## 2015-08-21 NOTE — Discharge Instructions (Signed)
Paresthesia Paresthesia is an abnormal burning or prickling sensation. This sensation is generally felt in the hands, arms, legs, or feet. However, it may occur in any part of the body. Usually, it is not painful. The feeling may be described as:  Tingling or numbness.  Pins and needles.  Skin crawling.  Buzzing.  Limbs falling asleep.  Itching. Most people experience temporary (transient) paresthesia at some time in their lives. Paresthesia may occur when you breathe too quickly (hyperventilation). It can also occur without any apparent cause. Commonly, paresthesia occurs when pressure is placed on a nerve. The sensation quickly goes away after the pressure is removed. For some people, however, paresthesia is a long-lasting (chronic) condition that is caused by an underlying disorder. If you continue to have paresthesia, you may need further medical evaluation. HOME CARE INSTRUCTIONS Watch your condition for any changes. Taking the following actions may help to lessen any discomfort that you are feeling:  Avoid drinking alcohol.  Try acupuncture or massage to help relieve your symptoms.  Keep all follow-up visits as directed by your health care provider. This is important. SEEK MEDICAL CARE IF:  You continue to have episodes of paresthesia.  Your burning or prickling feeling gets worse when you walk.  You have pain, cramps, or dizziness.  You develop a rash. SEEK IMMEDIATE MEDICAL CARE IF:  You feel weak.  You have trouble walking or moving.  You have problems with speech, understanding, or vision.  You feel confused.  You cannot control your bladder or bowel movements.  You have numbness after an injury.  You faint.   This information is not intended to replace advice given to you by your health care provider. Make sure you discuss any questions you have with your health care provider.   Document Released: 02/11/2002 Document Revised: 07/08/2014 Document Reviewed:  02/17/2014 Elsevier Interactive Patient Education 2016 [employer]  

## 2015-08-22 DIAGNOSIS — N2581 Secondary hyperparathyroidism of renal origin: Secondary | ICD-10-CM | POA: Diagnosis not present

## 2015-08-22 DIAGNOSIS — D631 Anemia in chronic kidney disease: Secondary | ICD-10-CM | POA: Diagnosis not present

## 2015-08-22 DIAGNOSIS — E1022 Type 1 diabetes mellitus with diabetic chronic kidney disease: Secondary | ICD-10-CM | POA: Diagnosis not present

## 2015-08-22 DIAGNOSIS — D508 Other iron deficiency anemias: Secondary | ICD-10-CM | POA: Diagnosis not present

## 2015-08-22 DIAGNOSIS — N186 End stage renal disease: Secondary | ICD-10-CM | POA: Diagnosis not present

## 2015-08-25 DIAGNOSIS — N2581 Secondary hyperparathyroidism of renal origin: Secondary | ICD-10-CM | POA: Diagnosis not present

## 2015-08-25 DIAGNOSIS — D508 Other iron deficiency anemias: Secondary | ICD-10-CM | POA: Diagnosis not present

## 2015-08-25 DIAGNOSIS — E1022 Type 1 diabetes mellitus with diabetic chronic kidney disease: Secondary | ICD-10-CM | POA: Diagnosis not present

## 2015-08-25 DIAGNOSIS — N186 End stage renal disease: Secondary | ICD-10-CM | POA: Diagnosis not present

## 2015-08-25 DIAGNOSIS — D631 Anemia in chronic kidney disease: Secondary | ICD-10-CM | POA: Diagnosis not present

## 2015-08-27 DIAGNOSIS — D508 Other iron deficiency anemias: Secondary | ICD-10-CM | POA: Diagnosis not present

## 2015-08-27 DIAGNOSIS — E1022 Type 1 diabetes mellitus with diabetic chronic kidney disease: Secondary | ICD-10-CM | POA: Diagnosis not present

## 2015-08-27 DIAGNOSIS — N2581 Secondary hyperparathyroidism of renal origin: Secondary | ICD-10-CM | POA: Diagnosis not present

## 2015-08-27 DIAGNOSIS — D631 Anemia in chronic kidney disease: Secondary | ICD-10-CM | POA: Diagnosis not present

## 2015-08-27 DIAGNOSIS — N186 End stage renal disease: Secondary | ICD-10-CM | POA: Diagnosis not present

## 2015-08-28 DIAGNOSIS — Z992 Dependence on renal dialysis: Secondary | ICD-10-CM | POA: Diagnosis not present

## 2015-08-28 DIAGNOSIS — N186 End stage renal disease: Secondary | ICD-10-CM | POA: Diagnosis not present

## 2015-08-28 DIAGNOSIS — I871 Compression of vein: Secondary | ICD-10-CM | POA: Diagnosis not present

## 2015-08-28 DIAGNOSIS — T82858D Stenosis of vascular prosthetic devices, implants and grafts, subsequent encounter: Secondary | ICD-10-CM | POA: Diagnosis not present

## 2015-08-29 DIAGNOSIS — N2581 Secondary hyperparathyroidism of renal origin: Secondary | ICD-10-CM | POA: Diagnosis not present

## 2015-08-29 DIAGNOSIS — D631 Anemia in chronic kidney disease: Secondary | ICD-10-CM | POA: Diagnosis not present

## 2015-08-29 DIAGNOSIS — E1022 Type 1 diabetes mellitus with diabetic chronic kidney disease: Secondary | ICD-10-CM | POA: Diagnosis not present

## 2015-08-29 DIAGNOSIS — D508 Other iron deficiency anemias: Secondary | ICD-10-CM | POA: Diagnosis not present

## 2015-08-29 DIAGNOSIS — N186 End stage renal disease: Secondary | ICD-10-CM | POA: Diagnosis not present

## 2015-09-01 ENCOUNTER — Encounter (HOSPITAL_COMMUNITY): Payer: Self-pay | Admitting: Emergency Medicine

## 2015-09-01 ENCOUNTER — Emergency Department (HOSPITAL_COMMUNITY)
Admission: EM | Admit: 2015-09-01 | Discharge: 2015-09-01 | Disposition: A | Discharge status: Home / Self Care | Payer: Medicare Other | Attending: Emergency Medicine | Admitting: Emergency Medicine

## 2015-09-01 DIAGNOSIS — I129 Hypertensive chronic kidney disease with stage 1 through stage 4 chronic kidney disease, or unspecified chronic kidney disease: Secondary | ICD-10-CM | POA: Diagnosis not present

## 2015-09-01 DIAGNOSIS — Z79899 Other long term (current) drug therapy: Secondary | ICD-10-CM | POA: Diagnosis not present

## 2015-09-01 DIAGNOSIS — N189 Chronic kidney disease, unspecified: Secondary | ICD-10-CM | POA: Insufficient documentation

## 2015-09-01 DIAGNOSIS — E162 Hypoglycemia, unspecified: Secondary | ICD-10-CM | POA: Diagnosis not present

## 2015-09-01 DIAGNOSIS — N186 End stage renal disease: Secondary | ICD-10-CM | POA: Diagnosis not present

## 2015-09-01 DIAGNOSIS — D631 Anemia in chronic kidney disease: Secondary | ICD-10-CM | POA: Diagnosis not present

## 2015-09-01 DIAGNOSIS — E1022 Type 1 diabetes mellitus with diabetic chronic kidney disease: Secondary | ICD-10-CM | POA: Diagnosis not present

## 2015-09-01 DIAGNOSIS — N2581 Secondary hyperparathyroidism of renal origin: Secondary | ICD-10-CM | POA: Diagnosis not present

## 2015-09-01 DIAGNOSIS — E1040 Type 1 diabetes mellitus with diabetic neuropathy, unspecified: Secondary | ICD-10-CM | POA: Diagnosis not present

## 2015-09-01 DIAGNOSIS — E161 Other hypoglycemia: Secondary | ICD-10-CM | POA: Diagnosis not present

## 2015-09-01 DIAGNOSIS — E10649 Type 1 diabetes mellitus with hypoglycemia without coma: Secondary | ICD-10-CM | POA: Insufficient documentation

## 2015-09-01 DIAGNOSIS — D508 Other iron deficiency anemias: Secondary | ICD-10-CM | POA: Diagnosis not present

## 2015-09-01 DIAGNOSIS — E11649 Type 2 diabetes mellitus with hypoglycemia without coma: Secondary | ICD-10-CM | POA: Diagnosis not present

## 2015-09-01 LAB — BASIC METABOLIC PANEL
Anion gap: 12 (ref 5–15)
BUN: 19 mg/dL (ref 6–20)
CHLORIDE: 98 mmol/L — AB (ref 101–111)
CO2: 28 mmol/L (ref 22–32)
CREATININE: 5.94 mg/dL — AB (ref 0.61–1.24)
Calcium: 8.3 mg/dL — ABNORMAL LOW (ref 8.9–10.3)
GFR calc non Af Amer: 11 mL/min — ABNORMAL LOW (ref >60)
GFR, EST AFRICAN AMERICAN: 13 mL/min — AB (ref >60)
GLUCOSE: 104 mg/dL — AB (ref 65–99)
Potassium: 3.2 mmol/L — ABNORMAL LOW (ref 3.5–5.1)
Sodium: 138 mmol/L (ref 135–145)

## 2015-09-01 LAB — CBG MONITORING, ED
GLUCOSE-CAPILLARY: 136 mg/dL — AB (ref 65–99)
GLUCOSE-CAPILLARY: 142 mg/dL — AB (ref 65–99)
GLUCOSE-CAPILLARY: 243 mg/dL — AB (ref 65–99)

## 2015-09-01 NOTE — ED Provider Notes (Signed)
CSN: [id number]     Arrival date & time 09/01/15  1534 History   First MD Initiated Contact with Patient 09/01/15 1544     Chief Complaint  Patient presents with  . Hypoglycemia    HPI Comments: 35 y.o. male with past medical history of type 1 diabetes, ESRD on hemodialysis, hypertension presents to the ED after being found mostly unresponsive while stopped at a stoplight. When the fire department arrived, his blood glucose was low, and he was given 2 packets of oral glucose. On repeat his blood glucose was up to 47. EMS gave him D50 which improved his blood glucose to 165. The patient reports he took his Lantus as usual this morning, and ate breakfast. He did not eat lunch at his usual time. He was running errands, and was on his way to meet his brother for lunch when this happened. He has otherwise been feeling healthy and well. Denies cough, congestion, shortness of breath, chest pain, abdominal pain, nausea, vomiting. He is feeling like his usual self now.  Patient is a 35 y.o. male presenting with hypoglycemia. The history is provided by the patient.  Hypoglycemia Blood sugar after intervention:  47 after 2 packets of oral glucose Severity:  Severe Onset quality:  Gradual Timing:  Constant Progression:  Unchanged Chronicity:  New Diabetic status:  Controlled with insulin Current diabetic therapy:  Lantus Context: decreased oral intake   Context comment:  Ate breakfast and then did not have lunch Relieved by:  Oral glucose and IV glucose Ineffective treatments:  Oral glucose Associated symptoms: altered mental status   Associated symptoms: no shortness of breath, no speech difficulty and no vomiting   Altered mental status:    Severity:  Severe   Timing:  Unable to specify   Progression:  Resolved   Past Medical History  Diagnosis Date  . Diabetes mellitus without complication ([place])   . Hypertension   . Chronic kidney disease   . Type 1 diabetes ([place])   . Heart murmur    denies any problems with it  . Seizures ([place])     none for 2 months as of 03/31/15  . Neuropathy ([place])   . Anemia   . Blind one eye     left eye   Past Surgical History  Procedure Laterality Date  . Insertion of dialysis catheter Right 03/03/2015    Procedure: INSERTION OF DIALYSIS CATHETER;  Surgeon: Serafina Mitchell, MD;  Location: [facility];  Service: Vascular;  Laterality: Right;  . Av fistula placement Right 03/03/2015    Procedure: RIGHT RADIO-CEPHALIC ARTERIOVENOUS (AV) FISTULA CREATION;  Surgeon: Serafina Mitchell, MD;  Location: [facility] OR;  Service: Vascular;  Laterality: Right;  . Eye surgery Right     for diabetic retinopathy  . Bascilic vein transposition Left 04/01/2015    Procedure: BASCILIC VEIN TRANSPOSITION-LEFT ARM- FIRST STAGE;  Surgeon: Elam Dutch, MD;  Location: [facility];  Service: Vascular;  Laterality: Left;  . Av fistula placement Left 06/15/2015    Procedure: INSERTION OF LEFT UPPER ARM  ARTERIOVENOUS (AV) 26mm x 50cm GORE-TEX GRAFT;  Surgeon: Elam Dutch, MD;  Location: [facility] OR;  Service: Vascular;  Laterality: Left;   Family History  Problem Relation Age of Onset  . Diabetes Neg Hx    Social History  Substance Use Topics  . Smoking status: Never Smoker   . Smokeless tobacco: Never Used  . Alcohol Use: Yes     Comment: rare    Review  of Systems  Constitutional: Negative for fever and chills.  HENT: Negative for congestion.   Eyes: Negative for redness.  Respiratory: Negative for shortness of breath.   Cardiovascular: Negative for chest pain.  Gastrointestinal: Negative for nausea, vomiting, abdominal pain and diarrhea.  Genitourinary: Negative for dysuria and flank pain.  Musculoskeletal: Negative for gait problem.  Skin: Negative for rash.  Neurological: Negative for speech difficulty.    Allergies  Review of patient's allergies indicates no known allergies.  Home Medications   Prior to Admission medications   Medication Sig Start Date End Date  Taking? Authorizing Provider  calcium acetate (PHOSLO) 667 MG capsule Take 1 capsule (667 mg total) by mouth 3 (three) times daily with meals. Patient taking differently: Take 2,001 mg by mouth 3 (three) times daily with meals. Take 2-3 capsules (1334 mg - 2001 mg) with meals (depending on size of meal) and 1 capsule (667 mg) with snacks 03/07/15   [name], MD  carvedilol (COREG) 25 MG tablet Take 25 mg by mouth 2 (two) times daily. 10/07/14   Historical Provider, MD  glucose blood (ONETOUCH VERIO) test strip 1 each by Other route 4 (four) times daily. Dx Code: E11.9 06/10/15   Renato Shin, MD  hydrALAZINE (APRESOLINE) 100 MG tablet Take 100 mg by mouth 2 (two) times daily.  03/09/15   Historical Provider, MD  insulin lispro (HUMALOG) 100 UNIT/ML injection Inject 2 Units into the skin 3 (three) times daily with meals. Uses sliding scale    Historical Provider, MD  insulin NPH Human (HUMULIN N) 100 UNIT/ML injection Inject 0.35 mLs (35 Units total) into the skin every morning. Patient not taking: Reported on 08/21/2015 08/08/15   Renato Shin, MD  LANTUS 100 UNIT/ML injection INJECT 30 UNITS INTO THE SKIN EVERY DAY BEFORE BREAKFAST 08/11/15   Renato Shin, MD  multivitamin (RENA-VIT) TABS tablet Take 1 tablet by mouth at bedtime. Patient taking differently: Take 1 tablet by mouth daily.  03/07/15   [name], MD  [facility] DELICA LANCETS [id number] MISC Use check blood sugar 4 times per day. Dx code: E11.9 06/10/15   Renato Shin, MD  oxyCODONE (ROXICODONE) 5 MG immediate release tablet Take 1 tablet (5 mg total) by mouth every 6 (six) hours as needed. Patient not taking: Reported on 08/21/2015 06/15/15   Samantha J Rhyne, PA-C   BP 160/95 mmHg  Pulse 80  Temp(Src) 98 F (36.7 C) (Oral)  Resp 13  Ht 5\' 11"  (1.803 m)  Wt 83.915 kg  BMI 25.81 kg/m2  SpO2 100% Physical Exam  Constitutional: He is oriented to person, place, and time. He appears well-developed and well-nourished. No distress.  Smiling,  awake, well appearing young male  HENT:  Head: Normocephalic and atraumatic.  Right Ear: External ear normal.  Left Ear: External ear normal.  Mouth/Throat: Oropharynx is clear and moist.  Neck: Normal range of motion.  Cardiovascular: Normal rate and regular rhythm.   Pulmonary/Chest: Effort normal and breath sounds normal.  Abdominal: Soft. He exhibits no distension. There is no tenderness.  Neurological: He is alert and oriented to person, place, and time.  Skin: Skin is warm and dry. He is not diaphoretic.  Psychiatric: He has a normal mood and affect.    ED Course  Procedures  Labs Review Labs Reviewed  BASIC METABOLIC PANEL - Abnormal; Notable for the following:    Potassium 3.2 (*)    Chloride 98 (*)    Glucose, Bld 104 (*)    Creatinine, Ser  5.94 (*)    Calcium 8.3 (*)    GFR calc non Af Amer 11 (*)    GFR calc Af Amer 13 (*)    All other components within normal limits  CBG MONITORING, ED - Abnormal; Notable for the following:    Glucose-Capillary 136 (*)    All other components within normal limits  CBG MONITORING, ED - Abnormal; Notable for the following:    Glucose-Capillary 142 (*)    All other components within normal limits  CBG MONITORING, ED - Abnormal; Notable for the following:    Glucose-Capillary 243 (*)    All other components within normal limits  CBG MONITORING, ED    Imaging Review No results found. I have personally reviewed and evaluated these images and lab results as part of my medical decision-making.   EKG Interpretation None      MDM   Final diagnoses:  Hypoglycemia  Type 1 diabetes mellitus with hypoglycemia and without coma ([place])   35 y.o. male with a past history of type 1 diabetes presents to the ED after a hypoglycemic episode. He did not eat lunch at his usual time, and took his full dose of Lantus this morning. He is feeling healthy and well, and denies any recent illnesses. His blood glucose improved appropriately with EMS  interventions. He was fed a meal in the ED and observed for several hours. Repeat blood glucose monitoring has revealed no further hypoglycemic episodes.  I do not suspect sepsis, ischemia, or any other organic cause of his hypoglycemia. He is healthy and well appearing, and feeling well. His presentation is explained by the fact that he did not eat lunch at his usual time, and took a full dose of Lantus this morning.  Strict return precautions were discussed and given writing. Follow up with PCP. Discharged home.  Case managed in conjunction with my attending, Dr. Reather Converse.      Berenice Primas, MD 09/01/15 2357  Elnora Morrison, MD 09/03/15 [phone]

## 2015-09-01 NOTE — ED Notes (Signed)
CBG 136 

## 2015-09-01 NOTE — Discharge Instructions (Signed)
Blood Glucose Monitoring, Adult Monitoring your blood glucose (also know as blood sugar) helps you to manage your diabetes. It also helps you and your health care provider monitor your diabetes and determine how well your treatment plan is working. WHY SHOULD YOU MONITOR YOUR BLOOD GLUCOSE?  It can help you understand how food, exercise, and medicine affect your blood glucose.  It allows you to know what your blood glucose is at any given moment. You can quickly tell if you are having low blood glucose (hypoglycemia) or high blood glucose (hyperglycemia).  It can help you and your health care provider know how to adjust your medicines.  It can help you understand how to manage an illness or adjust medicine for exercise. WHEN SHOULD YOU TEST? Your health care provider will help you decide how often you should check your blood glucose. This may depend on the type of diabetes you have, your diabetes control, or the types of medicines you are taking. Be sure to write down all of your blood glucose readings so that this information can be reviewed with your health care provider. See below for examples of testing times that your health care provider may suggest. Type 1 Diabetes  Test at least 2 times per day if your diabetes is well controlled, if you are using an insulin pump, or if you perform multiple daily injections.  If your diabetes is not well controlled or if you are sick, you may need to test more often.  It is a good idea to also test:  Before every insulin injection.  Before and after exercise.  Between meals and 2 hours after a meal.  Occasionally between 2:00 a.m. and 3:00 a.m. Type 2 Diabetes  If you are taking insulin, test at least 2 times per day. However, it is best to test before every insulin injection.  If you take medicines by mouth (orally), test 2 times a day.  If you are on a controlled diet, test once a day.  If your diabetes is not well controlled or if you  are sick, you may need to monitor more often. HOW TO MONITOR YOUR BLOOD GLUCOSE Supplies Needed  Blood glucose meter.  Test strips for your meter. Each meter has its own strips. You must use the strips that go with your own meter.  A pricking needle (lancet).  A device that holds the lancet (lancing device).  A journal or log book to write down your results. Procedure  Wash your hands with soap and water. Alcohol is not preferred.  Prick the side of your finger (not the tip) with the lancet.  Gently milk the finger until a small drop of blood appears.  Follow the instructions that come with your meter for inserting the test strip, applying blood to the strip, and using your blood glucose meter. Other Areas to Get Blood for Testing Some meters allow you to use other areas of your body (other than your finger) to test your blood. These areas are called alternative sites. The most common alternative sites are:  The forearm.  The thigh.  The back area of the lower leg.  The palm of the hand. The blood flow in these areas is slower. Therefore, the blood glucose values you get may be delayed, and the numbers are different from what you would get from your fingers. Do not use alternative sites if you think you are having hypoglycemia. Your reading will not be accurate. Always use a finger if you are  having hypoglycemia. Also, if you cannot feel your lows (hypoglycemia unawareness), always use your fingers for your blood glucose checks. ADDITIONAL TIPS FOR GLUCOSE MONITORING  Do not reuse lancets.  Always carry your supplies with you.  All blood glucose meters have a 24-hour "hotline" number to call if you have questions or need help.  Adjust (calibrate) your blood glucose meter with a control solution after finishing a few boxes of strips. BLOOD GLUCOSE RECORD KEEPING It is a good idea to keep a daily record or log of your blood glucose readings. Most glucose meters, if not all,  keep your glucose records stored in the meter. Some meters come with the ability to download your records to your home computer. Keeping a record of your blood glucose readings is especially helpful if you are wanting to look for patterns. Make notes to go along with the blood glucose readings because you might forget what happened at that exact time. Keeping good records helps you and your health care provider to work together to achieve good diabetes management.    This information is not intended to replace advice given to you by your health care provider. Make sure you discuss any questions you have with your health care provider.   Document Released: 02/24/2003 Document Revised: 03/14/2014 Document Reviewed: 07/16/2012 Elsevier Interactive Patient Education 2016 Else[employer]   Hypoglycemia Hypoglycemia occurs when the glucose in your blood is too low. Glucose is a type of sugar that is your body's main energy source. Hormones, such as insulin and glucagon, control the level of glucose in the blood. Insulin lowers blood glucose and glucagon increases blood glucose. Having too much insulin in your blood stream, or not eating enough food containing sugar, can result in hypoglycemia. Hypoglycemia can happen to people with or without diabetes. It can develop quickly and can be a medical emergency.  CAUSES   Missing or delaying meals.  Not eating enough carbohydrates at meals.  Taking too much diabetes medicine.  Not timing your oral diabetes medicine or insulin doses with meals, snacks, and exercise.  Nausea and vomiting.  Certain medicines.  Severe illnesses, such as hepatitis, kidney disorders, and certain eating disorders.  Increased activity or exercise without eating something extra or adjusting medicines.  Drinking too much alcohol.  A nerve disorder that affects body functions like your heart rate, blood pressure, and digestion (autonomic neuropathy).  A condition where the  stomach muscles do not function properly (gastroparesis). Therefore, medicines and food may not absorb properly.  Rarely, a tumor of the pancreas can produce too much insulin. SYMPTOMS   Hunger.  Sweating (diaphoresis).  Change in body temperature.  Shakiness.  Headache.  Anxiety.  Lightheadedness.  Irritability.  Difficulty concentrating.  Dry mouth.  Tingling or numbness in the hands or feet.  Restless sleep or sleep disturbances.  Altered speech and coordination.  Change in mental status.  Seizures or prolonged convulsions.  Combativeness.  Drowsiness (lethargic).  Weakness.  Increased heart rate or palpitations.  Confusion.  Pale, gray skin color.  Blurred or double vision.  Fainting. DIAGNOSIS  A physical exam and medical history will be performed. Your caregiver may make a diagnosis based on your symptoms. Blood tests and other lab tests may be performed to confirm a diagnosis. Once the diagnosis is made, your caregiver will see if your signs and symptoms go away once your blood glucose is raised.  TREATMENT  Usually, you can easily treat your hypoglycemia when you notice symptoms.  Check your  blood glucose. If it is less than 70 mg/dl, take one of the following:   3-4 glucose tablets.    cup juice.    cup regular soda.   1 cup skim milk.   -1 tube of glucose gel.   5-6 hard candies.   Avoid high-fat drinks or food that may delay a rise in blood glucose levels.  Do not take more than the recommended amount of sugary foods, drinks, gel, or tablets. Doing so will cause your blood glucose to go too high.   Wait 10-15 minutes and recheck your blood glucose. If it is still less than 70 mg/dl or below your target range, repeat treatment.   Eat a snack if it is more than 1 hour until your next meal.  There may be a time when your blood glucose may go so low that you are unable to treat yourself at home when you start to notice  symptoms. You may need someone to help you. You may even faint or be unable to swallow. If you cannot treat yourself, someone will need to bring you to the hospital.  [place]  If you have diabetes, follow your diabetes management plan by:  Taking your medicines as directed.  Following your exercise plan.  Following your meal plan. Do not skip meals. Eat on time.  Testing your blood glucose regularly. Check your blood glucose before and after exercise. If you exercise longer or different than usual, be sure to check blood glucose more frequently.  Wearing your medical alert jewelry that says you have diabetes.  Identify the cause of your hypoglycemia. Then, develop ways to prevent the recurrence of hypoglycemia.  Do not take a hot bath or shower right after an insulin shot.  Always carry treatment with you. Glucose tablets are the easiest to carry.  If you are going to drink alcohol, drink it only with meals.  Tell friends or family members ways to keep you safe during a seizure. This may include removing hard or sharp objects from the area or turning you on your side.  Maintain a healthy weight. SEEK MEDICAL CARE IF:   You are having problems keeping your blood glucose in your target range.  You are having frequent episodes of hypoglycemia.  You feel you might be having side effects from your medicines.  You are not sure why your blood glucose is dropping so low.  You notice a change in vision or a new problem with your vision. SEEK IMMEDIATE MEDICAL CARE IF:   Confusion develops.  A change in mental status occurs.  The inability to swallow develops.  Fainting occurs.   This information is not intended to replace advice given to you by your health care provider. Make sure you discuss any questions you have with your health care provider.   Document Released: 02/21/2005 Document Revised: 02/26/2013 Document Reviewed: 10/28/2014 Elsevier Interactive  Patient Education [employer].

## 2015-09-01 NOTE — ED Notes (Signed)
Pt brought to ED by [facility] after found him mostly unresponsive while driving, pt had HD today for 4 hours, when first responders arrive pt's CBG  2 oral glucose given and cbg up to 47, EMS arrive and gave 12.5 g of D50 IV and GBG up to 165. Pt states he gave himself 30 units of Lantus at 6:37 am and had breakfast, nothing to eat until now. Pt denies any pain or discomfort at this time, VS BP 147/88, HR 84, SPO2 100 %  RA. SR on monitor.

## 2015-09-01 NOTE — ED Notes (Signed)
CBG 243 

## 2015-09-01 NOTE — ED Notes (Signed)
  CBG 142

## 2015-09-03 DIAGNOSIS — N186 End stage renal disease: Secondary | ICD-10-CM | POA: Diagnosis not present

## 2015-09-03 DIAGNOSIS — E1022 Type 1 diabetes mellitus with diabetic chronic kidney disease: Secondary | ICD-10-CM | POA: Diagnosis not present

## 2015-09-03 DIAGNOSIS — D508 Other iron deficiency anemias: Secondary | ICD-10-CM | POA: Diagnosis not present

## 2015-09-03 DIAGNOSIS — N2581 Secondary hyperparathyroidism of renal origin: Secondary | ICD-10-CM | POA: Diagnosis not present

## 2015-09-03 DIAGNOSIS — D631 Anemia in chronic kidney disease: Secondary | ICD-10-CM | POA: Diagnosis not present

## 2015-09-04 DIAGNOSIS — E1122 Type 2 diabetes mellitus with diabetic chronic kidney disease: Secondary | ICD-10-CM | POA: Diagnosis not present

## 2015-09-04 DIAGNOSIS — N186 End stage renal disease: Secondary | ICD-10-CM | POA: Diagnosis not present

## 2015-09-04 DIAGNOSIS — Z992 Dependence on renal dialysis: Secondary | ICD-10-CM | POA: Diagnosis not present

## 2015-09-05 DIAGNOSIS — D508 Other iron deficiency anemias: Secondary | ICD-10-CM | POA: Diagnosis not present

## 2015-09-05 DIAGNOSIS — E1022 Type 1 diabetes mellitus with diabetic chronic kidney disease: Secondary | ICD-10-CM | POA: Diagnosis not present

## 2015-09-05 DIAGNOSIS — N186 End stage renal disease: Secondary | ICD-10-CM | POA: Diagnosis not present

## 2015-09-05 DIAGNOSIS — N2581 Secondary hyperparathyroidism of renal origin: Secondary | ICD-10-CM | POA: Diagnosis not present

## 2015-09-08 DIAGNOSIS — D508 Other iron deficiency anemias: Secondary | ICD-10-CM | POA: Diagnosis not present

## 2015-09-08 DIAGNOSIS — N2581 Secondary hyperparathyroidism of renal origin: Secondary | ICD-10-CM | POA: Diagnosis not present

## 2015-09-08 DIAGNOSIS — E1022 Type 1 diabetes mellitus with diabetic chronic kidney disease: Secondary | ICD-10-CM | POA: Diagnosis not present

## 2015-09-08 DIAGNOSIS — N186 End stage renal disease: Secondary | ICD-10-CM | POA: Diagnosis not present

## 2015-09-10 DIAGNOSIS — D508 Other iron deficiency anemias: Secondary | ICD-10-CM | POA: Diagnosis not present

## 2015-09-10 DIAGNOSIS — N2581 Secondary hyperparathyroidism of renal origin: Secondary | ICD-10-CM | POA: Diagnosis not present

## 2015-09-10 DIAGNOSIS — E1022 Type 1 diabetes mellitus with diabetic chronic kidney disease: Secondary | ICD-10-CM | POA: Diagnosis not present

## 2015-09-10 DIAGNOSIS — N186 End stage renal disease: Secondary | ICD-10-CM | POA: Diagnosis not present

## 2015-09-12 DIAGNOSIS — N186 End stage renal disease: Secondary | ICD-10-CM | POA: Diagnosis not present

## 2015-09-12 DIAGNOSIS — N2581 Secondary hyperparathyroidism of renal origin: Secondary | ICD-10-CM | POA: Diagnosis not present

## 2015-09-12 DIAGNOSIS — D508 Other iron deficiency anemias: Secondary | ICD-10-CM | POA: Diagnosis not present

## 2015-09-12 DIAGNOSIS — E1022 Type 1 diabetes mellitus with diabetic chronic kidney disease: Secondary | ICD-10-CM | POA: Diagnosis not present

## 2015-09-15 DIAGNOSIS — N186 End stage renal disease: Secondary | ICD-10-CM | POA: Diagnosis not present

## 2015-09-15 DIAGNOSIS — N2581 Secondary hyperparathyroidism of renal origin: Secondary | ICD-10-CM | POA: Diagnosis not present

## 2015-09-15 DIAGNOSIS — E1022 Type 1 diabetes mellitus with diabetic chronic kidney disease: Secondary | ICD-10-CM | POA: Diagnosis not present

## 2015-09-15 DIAGNOSIS — D508 Other iron deficiency anemias: Secondary | ICD-10-CM | POA: Diagnosis not present

## 2015-09-17 ENCOUNTER — Other Ambulatory Visit: Payer: Self-pay | Admitting: Endocrinology

## 2015-09-17 DIAGNOSIS — E1022 Type 1 diabetes mellitus with diabetic chronic kidney disease: Secondary | ICD-10-CM | POA: Diagnosis not present

## 2015-09-17 DIAGNOSIS — D508 Other iron deficiency anemias: Secondary | ICD-10-CM | POA: Diagnosis not present

## 2015-09-17 DIAGNOSIS — N2581 Secondary hyperparathyroidism of renal origin: Secondary | ICD-10-CM | POA: Diagnosis not present

## 2015-09-17 DIAGNOSIS — N186 End stage renal disease: Secondary | ICD-10-CM | POA: Diagnosis not present

## 2015-09-19 DIAGNOSIS — N2581 Secondary hyperparathyroidism of renal origin: Secondary | ICD-10-CM | POA: Diagnosis not present

## 2015-09-19 DIAGNOSIS — E1022 Type 1 diabetes mellitus with diabetic chronic kidney disease: Secondary | ICD-10-CM | POA: Diagnosis not present

## 2015-09-19 DIAGNOSIS — D508 Other iron deficiency anemias: Secondary | ICD-10-CM | POA: Diagnosis not present

## 2015-09-19 DIAGNOSIS — N186 End stage renal disease: Secondary | ICD-10-CM | POA: Diagnosis not present

## 2015-09-22 DIAGNOSIS — N2581 Secondary hyperparathyroidism of renal origin: Secondary | ICD-10-CM | POA: Diagnosis not present

## 2015-09-22 DIAGNOSIS — N186 End stage renal disease: Secondary | ICD-10-CM | POA: Diagnosis not present

## 2015-09-22 DIAGNOSIS — I10 Essential (primary) hypertension: Secondary | ICD-10-CM | POA: Diagnosis not present

## 2015-09-22 DIAGNOSIS — E784 Other hyperlipidemia: Secondary | ICD-10-CM | POA: Diagnosis not present

## 2015-09-22 DIAGNOSIS — N529 Male erectile dysfunction, unspecified: Secondary | ICD-10-CM | POA: Diagnosis not present

## 2015-09-22 DIAGNOSIS — E109 Type 1 diabetes mellitus without complications: Secondary | ICD-10-CM | POA: Diagnosis not present

## 2015-09-22 DIAGNOSIS — E1022 Type 1 diabetes mellitus with diabetic chronic kidney disease: Secondary | ICD-10-CM | POA: Diagnosis not present

## 2015-09-22 DIAGNOSIS — D508 Other iron deficiency anemias: Secondary | ICD-10-CM | POA: Diagnosis not present

## 2015-09-24 DIAGNOSIS — E1022 Type 1 diabetes mellitus with diabetic chronic kidney disease: Secondary | ICD-10-CM | POA: Diagnosis not present

## 2015-09-24 DIAGNOSIS — D508 Other iron deficiency anemias: Secondary | ICD-10-CM | POA: Diagnosis not present

## 2015-09-24 DIAGNOSIS — N2581 Secondary hyperparathyroidism of renal origin: Secondary | ICD-10-CM | POA: Diagnosis not present

## 2015-09-24 DIAGNOSIS — N186 End stage renal disease: Secondary | ICD-10-CM | POA: Diagnosis not present

## 2015-09-26 DIAGNOSIS — D508 Other iron deficiency anemias: Secondary | ICD-10-CM | POA: Diagnosis not present

## 2015-09-26 DIAGNOSIS — N2581 Secondary hyperparathyroidism of renal origin: Secondary | ICD-10-CM | POA: Diagnosis not present

## 2015-09-26 DIAGNOSIS — N186 End stage renal disease: Secondary | ICD-10-CM | POA: Diagnosis not present

## 2015-09-26 DIAGNOSIS — E1022 Type 1 diabetes mellitus with diabetic chronic kidney disease: Secondary | ICD-10-CM | POA: Diagnosis not present

## 2015-09-29 DIAGNOSIS — N186 End stage renal disease: Secondary | ICD-10-CM | POA: Diagnosis not present

## 2015-09-29 DIAGNOSIS — D508 Other iron deficiency anemias: Secondary | ICD-10-CM | POA: Diagnosis not present

## 2015-09-29 DIAGNOSIS — T82858D Stenosis of vascular prosthetic devices, implants and grafts, subsequent encounter: Secondary | ICD-10-CM | POA: Diagnosis not present

## 2015-09-29 DIAGNOSIS — I871 Compression of vein: Secondary | ICD-10-CM | POA: Diagnosis not present

## 2015-09-29 DIAGNOSIS — N2581 Secondary hyperparathyroidism of renal origin: Secondary | ICD-10-CM | POA: Diagnosis not present

## 2015-09-29 DIAGNOSIS — E1022 Type 1 diabetes mellitus with diabetic chronic kidney disease: Secondary | ICD-10-CM | POA: Diagnosis not present

## 2015-09-29 DIAGNOSIS — Z992 Dependence on renal dialysis: Secondary | ICD-10-CM | POA: Diagnosis not present

## 2015-10-01 DIAGNOSIS — E1129 Type 2 diabetes mellitus with other diabetic kidney complication: Secondary | ICD-10-CM | POA: Diagnosis not present

## 2015-10-01 DIAGNOSIS — N186 End stage renal disease: Secondary | ICD-10-CM | POA: Diagnosis not present

## 2015-10-01 DIAGNOSIS — E784 Other hyperlipidemia: Secondary | ICD-10-CM | POA: Diagnosis not present

## 2015-10-01 DIAGNOSIS — D508 Other iron deficiency anemias: Secondary | ICD-10-CM | POA: Diagnosis not present

## 2015-10-01 DIAGNOSIS — E1022 Type 1 diabetes mellitus with diabetic chronic kidney disease: Secondary | ICD-10-CM | POA: Diagnosis not present

## 2015-10-01 DIAGNOSIS — N2581 Secondary hyperparathyroidism of renal origin: Secondary | ICD-10-CM | POA: Diagnosis not present

## 2015-10-03 ENCOUNTER — Encounter (HOSPITAL_COMMUNITY): Payer: Self-pay | Admitting: *Deleted

## 2015-10-03 ENCOUNTER — Emergency Department (HOSPITAL_COMMUNITY)
Admission: EM | Admit: 2015-10-03 | Discharge: 2015-10-04 | Disposition: A | Discharge status: Home / Self Care | Payer: Medicare Other | Attending: Emergency Medicine | Admitting: Emergency Medicine

## 2015-10-03 DIAGNOSIS — G43909 Migraine, unspecified, not intractable, without status migrainosus: Secondary | ICD-10-CM | POA: Diagnosis not present

## 2015-10-03 DIAGNOSIS — E1065 Type 1 diabetes mellitus with hyperglycemia: Secondary | ICD-10-CM | POA: Diagnosis not present

## 2015-10-03 DIAGNOSIS — R519 Headache, unspecified: Secondary | ICD-10-CM

## 2015-10-03 DIAGNOSIS — D508 Other iron deficiency anemias: Secondary | ICD-10-CM | POA: Diagnosis not present

## 2015-10-03 DIAGNOSIS — N2581 Secondary hyperparathyroidism of renal origin: Secondary | ICD-10-CM | POA: Diagnosis not present

## 2015-10-03 DIAGNOSIS — Z794 Long term (current) use of insulin: Secondary | ICD-10-CM | POA: Diagnosis not present

## 2015-10-03 DIAGNOSIS — Z79899 Other long term (current) drug therapy: Secondary | ICD-10-CM | POA: Insufficient documentation

## 2015-10-03 DIAGNOSIS — I120 Hypertensive chronic kidney disease with stage 5 chronic kidney disease or end stage renal disease: Secondary | ICD-10-CM | POA: Insufficient documentation

## 2015-10-03 DIAGNOSIS — E1022 Type 1 diabetes mellitus with diabetic chronic kidney disease: Secondary | ICD-10-CM | POA: Diagnosis not present

## 2015-10-03 DIAGNOSIS — R739 Hyperglycemia, unspecified: Secondary | ICD-10-CM

## 2015-10-03 DIAGNOSIS — N186 End stage renal disease: Secondary | ICD-10-CM | POA: Diagnosis not present

## 2015-10-03 DIAGNOSIS — R51 Headache: Secondary | ICD-10-CM | POA: Diagnosis not present

## 2015-10-03 MED ORDER — KETOROLAC TROMETHAMINE 30 MG/ML IJ SOLN
30.0000 mg | Freq: Once | INTRAMUSCULAR | Status: AC
  Administered 2015-10-03: 30 mg via INTRAVENOUS
  Filled 2015-10-03: qty 1

## 2015-10-03 MED ORDER — METOCLOPRAMIDE HCL 5 MG/ML IJ SOLN
10.0000 mg | Freq: Once | INTRAMUSCULAR | Status: AC
  Administered 2015-10-03: 10 mg via INTRAVENOUS
  Filled 2015-10-03: qty 2

## 2015-10-03 MED ORDER — SODIUM CHLORIDE 0.9 % IV BOLUS (SEPSIS)
1000.0000 mL | Freq: Once | INTRAVENOUS | Status: AC
  Administered 2015-10-03: 1000 mL via INTRAVENOUS

## 2015-10-03 MED ORDER — DIPHENHYDRAMINE HCL 50 MG/ML IJ SOLN
25.0000 mg | Freq: Once | INTRAMUSCULAR | Status: AC
  Administered 2015-10-03: 25 mg via INTRAVENOUS
  Filled 2015-10-03: qty 1

## 2015-10-03 NOTE — ED Triage Notes (Signed)
Pt complains of migraine since yesterday, which became worse this evening. Pt states he is sensitive to sound.

## 2015-10-03 NOTE — ED Provider Notes (Signed)
[facility] DEPT Provider Note   CSN: [id number] Arrival date & time: 10/03/15  2202  First Provider Contact:  First MD Initiated Contact with Patient 10/03/15 2255        History   Chief Complaint Chief Complaint  Patient presents with  . Migraine    HPI Edward Colon is a 35 y.o. male.  Patient presents to the ED with a chief complaint of headache. He states that the headache started yesterday afternoon and has progressively worsened until now.  He reports associated phonophobia and photophobia. He denies history of migraine.  He denies any fevers, chills, numbness, weakness, or tingling.  He denies neck stiffness, but states that his neck muscles are sore.  He has tried taking tylenol and ibuprofen with some relief.    The history is provided by the patient. No language interpreter was used.    Past Medical History:  Diagnosis Date  . Anemia   . Blind one eye    left eye  . Chronic kidney disease   . Diabetes mellitus without complication ([place])   . Heart murmur    denies any problems with it  . Hypertension   . Neuropathy ([place])   . Seizures ([place])    none for 2 months as of 03/31/15  . Type 1 diabetes [facility])     Patient Active Problem List   Diagnosis Date Noted  . ESRD on dialysis ([place]) 03/07/2015  . Hyperglycemia 03/06/2015  . Acute on chronic renal failure ([place]) 02/27/2015  . URI (upper respiratory infection) 02/27/2015  . Acute-on-chronic kidney injury ([place]) 02/27/2015  . Rhabdomyolysis 08/31/2014  . Hypoglycemia 08/30/2014  . Type 1 diabetes ([place]) 08/30/2014  . CKD stage 2 due to type 2 diabetes mellitus ([place]) 08/30/2014  . Anemia 08/30/2014  . Swelling of arm 05/31/2013  . ARF (acute renal failure) ([place]) 05/31/2013  . Hyperkalemia 05/31/2013  . Seizure ([place]) 05/31/2013  . Seizures ([place])   . Hypertension     Past Surgical History:  Procedure Laterality Date  . AV FISTULA PLACEMENT Right 03/03/2015   Procedure: RIGHT RADIO-CEPHALIC  ARTERIOVENOUS (AV) FISTULA CREATION;  Surgeon: Serafina Mitchell, MD;  [facility];  Service: Vascular;  Laterality: Right;  . AV FISTULA PLACEMENT Left 06/15/2015   Procedure: INSERTION OF LEFT UPPER ARM  ARTERIOVENOUS (AV) 6mm x 50cm GORE-TEX GRAFT;  Surgeon: Elam Dutch, MD;  Location: [place];  Service: Vascular;  Laterality: Left;  . BASCILIC VEIN TRANSPOSITION Left 04/01/2015   Procedure: BASCILIC VEIN TRANSPOSITION-LEFT ARM- FIRST STAGE;  Surgeon: Elam Dutch, MD;  Location: [place];  Service: Vascular;  Laterality: Left;  . EYE SURGERY Right    for diabetic retinopathy  . INSERTION OF DIALYSIS CATHETER Right 03/03/2015   Procedure: INSERTION OF DIALYSIS CATHETER;  Surgeon: Serafina Mitchell, MD;  [facility];  Service: Vascular;  Laterality: Right;       Home Medications    Prior to Admission medications   Medication Sig Start Date End Date Taking? Authorizing Provider  calcium acetate (PHOSLO) 667 MG capsule Take 1 capsule (667 mg total) by mouth 3 (three) times daily with meals. Patient taking differently: Take 2,001 mg by mouth 3 (three) times daily with meals. Take 3 capsules (1334 mg - 2001 mg) with meals (depending on size of meal) and 1 capsule (667 mg) with snacks 03/07/15  Yes [name], MD  carvedilol (COREG) 25 MG tablet Take 25 mg by mouth 2 (two) times daily. 10/07/14  Yes  Historical Provider, MD  hydrALAZINE (APRESOLINE) 100 MG tablet Take 100 mg by mouth 2 (two) times daily.  03/09/15  Yes Historical Provider, MD  insulin lispro (HUMALOG) 100 UNIT/ML injection Inject 2-10 Units into the skin 3 (three) times daily with meals. Uses sliding scale   Yes Historical Provider, MD  LANTUS 100 UNIT/ML injection INJECT 30 UNITS INTO THE SKIN EVERY DAY BEFORE BREAKFAST 09/18/15  Yes Renato Shin, MD  multivitamin (RENA-VIT) TABS tablet Take 1 tablet by mouth at bedtime. Patient taking differently: Take 1 tablet by mouth daily.  03/07/15  Yes [name], MD    [facility] DELICA LANCETS [id number] MISC Use check blood sugar 4 times per day. Dx code: E11.9 06/10/15  Yes Renato Shin, MD  [facility] VERIO test strip USE 4 TIMES DAILY AS DIREDCTED 09/18/15  Yes Renato Shin, MD  insulin NPH Human (HUMULIN N) 100 UNIT/ML injection Inject 0.35 mLs (35 Units total) into the skin every morning. Patient not taking: Reported on 08/21/2015 08/08/15   Renato Shin, MD  oxyCODONE (ROXICODONE) 5 MG immediate release tablet Take 1 tablet (5 mg total) by mouth every 6 (six) hours as needed. Patient not taking: Reported on 08/21/2015 06/15/15   Gabriel Earing, PA-C    Family History Family History  Problem Relation Age of Onset  . Diabetes Neg Hx     Social History Social History  Substance Use Topics  . Smoking status: Never Smoker  . Smokeless tobacco: Never Used  . Alcohol use Yes     Comment: rare     Allergies   Review of patient's allergies indicates no known allergies.   Review of Systems Review of Systems  Neurological: Positive for headaches.  All other systems reviewed and are negative.    Physical Exam Updated Vital Signs BP 168/88 (BP Location: Right Arm)   Pulse 104   Temp 98.6 F (37 C) (Oral)   Resp 18   SpO2 99%   Physical Exam  Constitutional: He is oriented to person, place, and time. He appears well-developed and well-nourished.  HENT:  Head: Normocephalic and atraumatic.  Right Ear: External ear normal.  Left Ear: External ear normal.  Eyes: Conjunctivae and EOM are normal. Pupils are equal, round, and reactive to light.  Neck: Normal range of motion. Neck supple.  No pain with neck flexion, no meningismus Cervical paraspinal muscles are tender to palpation Negative kernig and brudzinski  Cardiovascular: Normal rate, regular rhythm and normal heart sounds.  Exam reveals no gallop and no friction rub.   No murmur heard. Pulmonary/Chest: Effort normal and breath sounds normal. No respiratory distress. He has no wheezes. He has no  rales. He exhibits no tenderness.  Abdominal: Soft. He exhibits no distension and no mass. There is no tenderness. There is no rebound and no guarding.  Musculoskeletal: Normal range of motion. He exhibits no edema or tenderness.  Normal gait.   Neurological: He is alert and oriented to person, place, and time. He has normal reflexes.  CN 3-12 intact, normal finger to nose, no pronator drift, sensation and strength intact bilaterally.  Skin: Skin is warm and dry.  Psychiatric: He has a normal mood and affect. His behavior is normal. Judgment and thought content normal.  Nursing note and vitals reviewed.    ED Treatments / Results  Labs (all labs ordered are listed, but only abnormal results are displayed) Labs Reviewed - No data to display  EKG  EKG Interpretation None  Radiology No results found.  Procedures Procedures (including critical care time)  Medications Ordered in ED Medications  ketorolac (TORADOL) 30 MG/ML injection 30 mg (not administered)  metoCLOPramide (REGLAN) injection 10 mg (not administered)  diphenhydrAMINE (BENADRYL) injection 25 mg (not administered)  sodium chloride 0.9 % bolus 1,000 mL (not administered)     Initial Impression / Assessment and Plan / ED Course  I have reviewed the triage vital signs and the nursing notes.  Pertinent labs & imaging results that were available during my care of the patient were reviewed by me and considered in my medical decision making (see chart for details).  Clinical Course    Patient with headache that started yesterday.  Slowly worsening.  No fever.  No meningismus.  Doubt meningitis.  Will treat with headache cocktail and reassess.  Patient is non-toxic appearing.  12:04 AM Patient reassessed.  States that his headache is improving.  He is asleep.  CBG is 430.  Will check chem 8 to ensure no anion gap.  12:46 AM K is 8.5, likely hemolyzed, will check BMP.  Patient states that his headache is  significantly improved.    Patient is groggy after meds, but responds to questions.  Neurovascularly intact.  No neck stiffness or rigidity.  Patient seen by and discussed with Dr. Vanita Panda, who recommends rechecking BMP and discharge if appropriate.  Next HD on Tuesday.  Patient signed out to Olean Ree, NP.  Final Clinical Impressions(s) / ED Diagnoses   Final diagnoses:  Nonintractable headache, unspecified chronicity pattern, unspecified headache type    New Prescriptions New Prescriptions   No medications on file     Montine Circle, PA-C 10/04/15 0056    Carmin Muskrat, MD 10/04/15 [phone]

## 2015-10-04 DIAGNOSIS — G43909 Migraine, unspecified, not intractable, without status migrainosus: Secondary | ICD-10-CM | POA: Diagnosis not present

## 2015-10-04 LAB — CBG MONITORING, ED
Glucose-Capillary: 367 mg/dL — ABNORMAL HIGH (ref 65–99)
Glucose-Capillary: 430 mg/dL — ABNORMAL HIGH (ref 65–99)

## 2015-10-04 LAB — BASIC METABOLIC PANEL
ANION GAP: 11 (ref 5–15)
BUN: 30 mg/dL — ABNORMAL HIGH (ref 6–20)
CALCIUM: 8.4 mg/dL — AB (ref 8.9–10.3)
CHLORIDE: 94 mmol/L — AB (ref 101–111)
CO2: 26 mmol/L (ref 22–32)
Creatinine, Ser: 6.94 mg/dL — ABNORMAL HIGH (ref 0.61–1.24)
GFR calc Af Amer: 11 mL/min — ABNORMAL LOW (ref >60)
GFR calc non Af Amer: 9 mL/min — ABNORMAL LOW (ref >60)
GLUCOSE: 475 mg/dL — AB (ref 65–99)
Potassium: 4.7 mmol/L (ref 3.5–5.1)
Sodium: 131 mmol/L — ABNORMAL LOW (ref 135–145)

## 2015-10-04 NOTE — Discharge Instructions (Signed)
Make an appointment with your PCP for follow up as needed Your repeat potassium is 4.7 Make sure to keep you dialysis appointment on Tuesday

## 2015-10-04 NOTE — ED Provider Notes (Signed)
I received patient change of shift report received from Montine Circle PA patient was in the emergency department being treated for migraine headache.  He also is end-stage renal disease and diabetic.  His blood sugar was greater than 400.  After 1 L of IV fluids.  He is in the 350 range.  His headache is gone on his initial set of labs.  His potassium was 8.  This was repeated and his potassium is 4.7, well within normal range.  He is due for dialysis on Tuesday.  He's been instructed to make sure you keep this appointment to make an appointment with his primary care physician for follow-up as needed and when he arrives home.  He is to take his normal insulins   Junius Creamer, NP 10/04/15 [place], NP 10/04/15 [id number]    Quintella Reichert, MD 10/04/15 1357

## 2015-10-05 DIAGNOSIS — E1122 Type 2 diabetes mellitus with diabetic chronic kidney disease: Secondary | ICD-10-CM | POA: Diagnosis not present

## 2015-10-05 DIAGNOSIS — Z992 Dependence on renal dialysis: Secondary | ICD-10-CM | POA: Diagnosis not present

## 2015-10-05 DIAGNOSIS — N186 End stage renal disease: Secondary | ICD-10-CM | POA: Diagnosis not present

## 2015-10-06 DIAGNOSIS — E1022 Type 1 diabetes mellitus with diabetic chronic kidney disease: Secondary | ICD-10-CM | POA: Diagnosis not present

## 2015-10-06 DIAGNOSIS — N2581 Secondary hyperparathyroidism of renal origin: Secondary | ICD-10-CM | POA: Diagnosis not present

## 2015-10-06 DIAGNOSIS — N186 End stage renal disease: Secondary | ICD-10-CM | POA: Diagnosis not present

## 2015-10-08 DIAGNOSIS — N186 End stage renal disease: Secondary | ICD-10-CM | POA: Diagnosis not present

## 2015-10-08 DIAGNOSIS — N2581 Secondary hyperparathyroidism of renal origin: Secondary | ICD-10-CM | POA: Diagnosis not present

## 2015-10-08 DIAGNOSIS — E1022 Type 1 diabetes mellitus with diabetic chronic kidney disease: Secondary | ICD-10-CM | POA: Diagnosis not present

## 2015-10-10 DIAGNOSIS — N2581 Secondary hyperparathyroidism of renal origin: Secondary | ICD-10-CM | POA: Diagnosis not present

## 2015-10-10 DIAGNOSIS — E1022 Type 1 diabetes mellitus with diabetic chronic kidney disease: Secondary | ICD-10-CM | POA: Diagnosis not present

## 2015-10-10 DIAGNOSIS — N186 End stage renal disease: Secondary | ICD-10-CM | POA: Diagnosis not present

## 2015-10-13 DIAGNOSIS — N186 End stage renal disease: Secondary | ICD-10-CM | POA: Diagnosis not present

## 2015-10-13 DIAGNOSIS — N2581 Secondary hyperparathyroidism of renal origin: Secondary | ICD-10-CM | POA: Diagnosis not present

## 2015-10-13 DIAGNOSIS — E1022 Type 1 diabetes mellitus with diabetic chronic kidney disease: Secondary | ICD-10-CM | POA: Diagnosis not present

## 2015-10-15 DIAGNOSIS — E1022 Type 1 diabetes mellitus with diabetic chronic kidney disease: Secondary | ICD-10-CM | POA: Diagnosis not present

## 2015-10-15 DIAGNOSIS — N2581 Secondary hyperparathyroidism of renal origin: Secondary | ICD-10-CM | POA: Diagnosis not present

## 2015-10-15 DIAGNOSIS — N186 End stage renal disease: Secondary | ICD-10-CM | POA: Diagnosis not present

## 2015-10-17 DIAGNOSIS — N2581 Secondary hyperparathyroidism of renal origin: Secondary | ICD-10-CM | POA: Diagnosis not present

## 2015-10-17 DIAGNOSIS — N186 End stage renal disease: Secondary | ICD-10-CM | POA: Diagnosis not present

## 2015-10-17 DIAGNOSIS — E1022 Type 1 diabetes mellitus with diabetic chronic kidney disease: Secondary | ICD-10-CM | POA: Diagnosis not present

## 2015-10-22 ENCOUNTER — Encounter (HOSPITAL_COMMUNITY): Payer: Self-pay | Admitting: Emergency Medicine

## 2015-10-22 ENCOUNTER — Non-Acute Institutional Stay (HOSPITAL_COMMUNITY)
Admission: EM | Admit: 2015-10-22 | Discharge: 2015-10-22 | Payer: Medicare Other | Attending: Emergency Medicine | Admitting: Emergency Medicine

## 2015-10-22 DIAGNOSIS — E10319 Type 1 diabetes mellitus with unspecified diabetic retinopathy without macular edema: Secondary | ICD-10-CM | POA: Insufficient documentation

## 2015-10-22 DIAGNOSIS — N186 End stage renal disease: Secondary | ICD-10-CM | POA: Insufficient documentation

## 2015-10-22 DIAGNOSIS — Z79899 Other long term (current) drug therapy: Secondary | ICD-10-CM | POA: Insufficient documentation

## 2015-10-22 DIAGNOSIS — I120 Hypertensive chronic kidney disease with stage 5 chronic kidney disease or end stage renal disease: Secondary | ICD-10-CM | POA: Insufficient documentation

## 2015-10-22 DIAGNOSIS — Z794 Long term (current) use of insulin: Secondary | ICD-10-CM | POA: Diagnosis not present

## 2015-10-22 DIAGNOSIS — Z992 Dependence on renal dialysis: Secondary | ICD-10-CM | POA: Diagnosis not present

## 2015-10-22 DIAGNOSIS — E1040 Type 1 diabetes mellitus with diabetic neuropathy, unspecified: Secondary | ICD-10-CM | POA: Diagnosis not present

## 2015-10-22 DIAGNOSIS — E1022 Type 1 diabetes mellitus with diabetic chronic kidney disease: Secondary | ICD-10-CM | POA: Diagnosis not present

## 2015-10-22 DIAGNOSIS — E875 Hyperkalemia: Secondary | ICD-10-CM | POA: Insufficient documentation

## 2015-10-22 LAB — BASIC METABOLIC PANEL
Anion gap: 7 (ref 5–15)
BUN: 61 mg/dL — AB (ref 6–20)
CHLORIDE: 104 mmol/L (ref 101–111)
CO2: 24 mmol/L (ref 22–32)
CREATININE: 10 mg/dL — AB (ref 0.61–1.24)
Calcium: 8.9 mg/dL (ref 8.9–10.3)
GFR calc Af Amer: 7 mL/min — ABNORMAL LOW (ref >60)
GFR, EST NON AFRICAN AMERICAN: 6 mL/min — AB (ref >60)
GLUCOSE: 222 mg/dL — AB (ref 65–99)
POTASSIUM: 6.3 mmol/L — AB (ref 3.5–5.1)
Sodium: 135 mmol/L (ref 135–145)

## 2015-10-22 LAB — CBC
HEMATOCRIT: 36.7 % — AB (ref 39.0–52.0)
Hemoglobin: 12.2 g/dL — ABNORMAL LOW (ref 13.0–17.0)
MCH: 29 pg (ref 26.0–34.0)
MCHC: 33.2 g/dL (ref 30.0–36.0)
MCV: 87.4 fL (ref 78.0–100.0)
Platelets: 304 10*3/uL (ref 150–400)
RBC: 4.2 MIL/uL — ABNORMAL LOW (ref 4.22–5.81)
RDW: 13.6 % (ref 11.5–15.5)
WBC: 5.1 10*3/uL (ref 4.0–10.5)

## 2015-10-22 MED ORDER — LIDOCAINE-PRILOCAINE 2.5-2.5 % EX CREA: 1.0000 "application " | TOPICAL_CREAM | CUTANEOUS | Status: DC | PRN

## 2015-10-22 MED ORDER — HEPARIN SODIUM (PORCINE) 1000 UNIT/ML DIALYSIS
100.0000 [IU]/kg | INTRAMUSCULAR | Status: DC | PRN
  Filled 2015-10-22: qty 9

## 2015-10-22 MED ORDER — LIDOCAINE HCL (PF) 1 % IJ SOLN: 5.0000 mL | INTRAMUSCULAR | Status: DC | PRN

## 2015-10-22 MED ORDER — SODIUM POLYSTYRENE SULFONATE 15 GM/60ML PO SUSP
45.0000 g | Freq: Once | ORAL | Status: AC
  Administered 2015-10-22: 45 g via ORAL
  Filled 2015-10-22: qty 180

## 2015-10-22 MED ORDER — HEPARIN SODIUM (PORCINE) 1000 UNIT/ML DIALYSIS
1000.0000 [IU] | INTRAMUSCULAR | Status: DC | PRN
  Filled 2015-10-22: qty 1

## 2015-10-22 MED ORDER — PENTAFLUOROPROP-TETRAFLUOROETH EX AERO
1.0000 "application " | INHALATION_SPRAY | CUTANEOUS | Status: DC | PRN
  Filled 2015-10-22: qty 30

## 2015-10-22 MED ORDER — ALTEPLASE 2 MG IJ SOLR
2.0000 mg | Freq: Once | INTRAMUSCULAR | Status: DC | PRN
  Filled 2015-10-22: qty 2

## 2015-10-22 MED ORDER — SODIUM CHLORIDE 0.9 % IV SOLN: 100.0000 mL | INTRAVENOUS | Status: DC | PRN

## 2015-10-22 NOTE — ED Triage Notes (Signed)
Pt sent here from jail for evaluation for dialysis. Pt reports he normally has dialysis Tuesday, Thursday, and Saturday, but missed last appointment due to hypoglycemic episode. Last treatment 7/12. Jail able to arrange transport to dialysis based on results.

## 2015-10-22 NOTE — Consult Note (Signed)
Outpt HD Orders Unit: [facility] Days: [facility] Time: 4h15 Dialyzer: F180 EDW: 86kg K/Ca: 2/2 Access: AVF LUE BFR/DFR: 500/800 UF Proflie: none VDRA: 72mcg hectorol qTx EP[O: none  Heparin: 10k IVB and 2k mid Tx  Last HD was 8/12, 3h42min.  Achieved EDW with nromal BP.  Pt came to ED as screening for new incaceraton.  Found to have K 6.3 and some peaked Ts.  Pt feels well w/o complaints.  RRR, no rub CTAB No edema LUE AVG +B/T   Plan for HD this evening and then to return to HD for disposition.  Prison staff already aware of need to arrange ongoing HD at [facility].

## 2015-10-22 NOTE — ED Notes (Signed)
Pt will be sent to [facility] for dialysis

## 2015-10-22 NOTE — Progress Notes (Signed)
Hemodialysis- K resulted 3.9 via I-stat after 90 minutes on HD. Pt tolerating procedure well

## 2015-10-22 NOTE — ED Notes (Signed)
Attempt x 1 at calling report to [facility] ED. No answer on charge phone

## 2015-10-22 NOTE — ED Notes (Signed)
I attempted to collect labs twice and was unsuccessful.  I made the nurse aware. 

## 2015-10-22 NOTE — ED Provider Notes (Signed)
[facility] DEPT Provider Note   CSN: [id number] Arrival date & time: 10/22/15  1212     History   Chief Complaint Chief Complaint  Patient presents with  . Vascular Access Problem    HPI Edward Colon is a 35 y.o. male.  HPI Patient states he missed his dialysis due to being incarcerated. He advises last dialysis was on 8\12. The patient does not have any symptoms at this time. No shortness of breath, no swelling, no chest pain. He reports his regular schedule is Tuesday Thursday Saturday. He is sent from the jail for assessment for need of dialysis. Past Medical History:  Diagnosis Date  . Anemia   . Blind one eye    left eye  . Chronic kidney disease   . Diabetes mellitus without complication ([place])   . Heart murmur    denies any problems with it  . Hypertension   . Neuropathy ([place])   . Seizures ([place])    none for 2 months as of 03/31/15  . Type 1 diabetes [facility])     Patient Active Problem List   Diagnosis Date Noted  . ESRD on dialysis ([place]) 03/07/2015  . Hyperglycemia 03/06/2015  . Acute on chronic renal failure ([place]) 02/27/2015  . URI (upper respiratory infection) 02/27/2015  . Acute-on-chronic kidney injury ([place]) 02/27/2015  . Rhabdomyolysis 08/31/2014  . Hypoglycemia 08/30/2014  . Type 1 diabetes ([place]) 08/30/2014  . CKD stage 2 due to type 2 diabetes mellitus ([place]) 08/30/2014  . Anemia 08/30/2014  . Swelling of arm 05/31/2013  . ARF (acute renal failure) ([place]) 05/31/2013  . Hyperkalemia 05/31/2013  . Seizure ([place]) 05/31/2013  . Seizures ([place])   . Hypertension     Past Surgical History:  Procedure Laterality Date  . AV FISTULA PLACEMENT Right 03/03/2015   Procedure: RIGHT RADIO-CEPHALIC ARTERIOVENOUS (AV) FISTULA CREATION;  Surgeon: Serafina Mitchell, MD;  [facility] OR;  Service: Vascular;  Laterality: Right;  . AV FISTULA PLACEMENT Left 06/15/2015   Procedure: INSERTION OF LEFT UPPER ARM  ARTERIOVENOUS (AV) 71mm x 50cm GORE-TEX GRAFT;  Surgeon:  Elam Dutch, MD;  Location: [place];  Service: Vascular;  Laterality: Left;  . BASCILIC VEIN TRANSPOSITION Left 04/01/2015   Procedure: BASCILIC VEIN TRANSPOSITION-LEFT ARM- FIRST STAGE;  Surgeon: Elam Dutch, MD;  Location: [place];  Service: Vascular;  Laterality: Left;  . EYE SURGERY Right    for diabetic retinopathy  . INSERTION OF DIALYSIS CATHETER Right 03/03/2015   Procedure: INSERTION OF DIALYSIS CATHETER;  Surgeon: Serafina Mitchell, MD;  [facility] OR;  Service: Vascular;  Laterality: Right;       Home Medications    Prior to Admission medications   Medication Sig Start Date End Date Taking? Authorizing Provider  calcium acetate (PHOSLO) 667 MG capsule Take 1 capsule (667 mg total) by mouth 3 (three) times daily with meals. Patient taking differently: Take 2,001 mg by mouth 3 (three) times daily with meals. Take 3 capsules (1334 mg - 2001 mg) with meals (depending on size of meal) and 1 capsule (667 mg) with snacks 03/07/15  Yes [name], MD  insulin lispro (HUMALOG) 100 UNIT/ML injection Inject 2-10 Units into the skin 3 (three) times daily with meals. Uses sliding scale   Yes Historical Provider, MD  LANTUS 100 UNIT/ML injection INJECT 30 UNITS INTO THE SKIN EVERY DAY BEFORE BREAKFAST 09/18/15  Yes Renato Shin, MD  multivitamin (RENA-VIT) TABS tablet Take 1 tablet by mouth at bedtime. Patient taking  differently: Take 1 tablet by mouth daily.  03/07/15  Yes [name], MD  [facility] DELICA LANCETS [id number] MISC Use check blood sugar 4 times per day. Dx code: E11.9 06/10/15  Yes Renato Shin, MD  [facility] VERIO test strip USE 4 TIMES DAILY AS DIREDCTED 09/18/15  Yes Renato Shin, MD  insulin NPH Human (HUMULIN N) 100 UNIT/ML injection Inject 0.35 mLs (35 Units total) into the skin every morning. Patient not taking: Reported on 08/21/2015 08/08/15   Renato Shin, MD  oxyCODONE (ROXICODONE) 5 MG immediate release tablet Take 1 tablet (5 mg total) by mouth every 6 (six) hours as  needed. Patient not taking: Reported on 08/21/2015 06/15/15   Gabriel Earing, PA-C    Family History Family History  Problem Relation Age of Onset  . Diabetes Neg Hx     Social History Social History  Substance Use Topics  . Smoking status: Never Smoker  . Smokeless tobacco: Never Used  . Alcohol use Yes     Comment: rare     Allergies   Review of patient's allergies indicates no known allergies.   Review of Systems Review of Systems 10 Systems reviewed and are negative for acute change except as noted in the HPI.   Physical Exam Updated Vital Signs BP 155/81   Pulse 80   Temp 98.5 F (36.9 C) (Oral)   Resp 22   SpO2 99%   Physical Exam  Constitutional: He appears well-developed and well-nourished.  HENT:  Head: Normocephalic and atraumatic.  Eyes: Conjunctivae are normal.  Neck: Neck supple.  Cardiovascular: Normal rate and regular rhythm.   No murmur heard. Pulmonary/Chest: Effort normal and breath sounds normal. No respiratory distress.  Abdominal: Soft. He exhibits no distension. There is no tenderness.  Musculoskeletal: He exhibits no edema or tenderness.  Neurological: He is alert.  Skin: Skin is warm and dry.  Psychiatric: He has a normal mood and affect.  Nursing note and vitals reviewed.    ED Treatments / Results  Labs (all labs ordered are listed, but only abnormal results are displayed) Labs Reviewed  BASIC METABOLIC PANEL - Abnormal; Notable for the following:       Result Value   Potassium 6.3 (*)    Glucose, Bld 222 (*)    BUN 61 (*)    Creatinine, Ser 10.00 (*)    GFR calc non Af Amer 6 (*)    GFR calc Af Amer 7 (*)    All other components within normal limits  CBC - Abnormal; Notable for the following:    RBC 4.20 (*)    Hemoglobin 12.2 (*)    HCT 36.7 (*)    All other components within normal limits    EKG  EKG Interpretation  Date/Time:  Thursday October 22 2015 15:28:33 EDT Ventricular Rate:  78 PR Interval:    QRS  Duration: 79 QT Interval:  378 QTC Calculation: 431 R Axis:   62 Text Interpretation:  Sinus rhythm Probable left atrial enlargement peaked T waves. V1 t wave inversion new c/w prior. Confirmed by Johnney Killian, MD, Jeannie Done [id number]) on 10/22/2015 3:53:44 PM       Radiology No results found.  Procedures Procedures (including critical care time)  Medications Ordered in ED Medications  sodium polystyrene (KAYEXALATE) 15 GM/60ML suspension 45 g (45 g Oral Given 10/22/15 1610)     Initial Impression / Assessment and Plan / ED Course  I have reviewed the triage vital signs and the nursing notes.  Pertinent  labs & imaging results that were available during my care of the patient were reviewed by me and considered in my medical decision making (see chart for details).  Clinical Course   Consult (15:12) [facility] Dr.  Joelyn Oms advises to give Kayexalate 45 g in the emergency department and has a jail contact the patient's dialysis center for outpatient dialysis tomorrow.  I have recontacted Dr. Carmina Miller regarding  peaking of T waves on patient's ECG. At this time, we will change plan and have the patient transferred to [facility] emergency department to have dialysis at [facility] and then plan for subsequent discharge.  Final Clinical Impressions(s) / ED Diagnoses   Final diagnoses:  ESRD on dialysis [facility])  Hyperkalemia   Patient presents for having missed dialysis on outpatient basis. Patient is asymptomatic. He does however have elevated potassium and EKG with peaking of the T waves. The patient is clinically asymptomatic. Due to the combination of EKG changes with hyperkalemia, the patient will be emergently dialyzed today. He otherwise has no complaints and is well in appearance. After dialysis the patient is appropriate for discharge. New Prescriptions New Prescriptions   No medications on file     Charlesetta Shanks, MD 10/22/15 1651

## 2015-10-22 NOTE — Progress Notes (Signed)
Unable to get I-stat to tranfer data. Copy of lab results printed and placed with patient consent/paperwork.

## 2015-10-22 NOTE — Progress Notes (Signed)
Hemodialysis- Treatment started at 1905 without issue. Pt currently resting comfortably. Given juice and sandwich. Will continue to monitor.

## 2015-10-22 NOTE — Progress Notes (Signed)
Hemodialysis- Pt tolerated procedure well. 3L uf removed. Right FA IV removed. Pt discharged to police custody, left unit on foot with officers. Vitals remained stable throughout and patient has no complaints.

## 2015-10-22 NOTE — ED Notes (Signed)
Report called to Charge RN Dialysis center updated with progress of transfer

## 2015-10-22 NOTE — ED Notes (Signed)
Bed: WA08 Expected date:  Expected time:  Means of arrival:  Comments: [place]

## 2015-10-26 LAB — GLUCOSE, CAPILLARY: Glucose-Capillary: 164 mg/dL — ABNORMAL HIGH (ref 65–99)

## 2015-10-26 LAB — POCT I-STAT, CHEM 8
BUN: 34 mg/dL — ABNORMAL HIGH (ref 6–20)
CALCIUM ION: 1.09 mmol/L — AB (ref 1.13–1.30)
CREATININE: 5.7 mg/dL — AB (ref 0.61–1.24)
Chloride: 101 mmol/L (ref 101–111)
GLUCOSE: 255 mg/dL — AB (ref 65–99)
HEMATOCRIT: 36 % — AB (ref 39.0–52.0)
HEMOGLOBIN: 12.2 g/dL — AB (ref 13.0–17.0)
Potassium: 3.7 mmol/L (ref 3.5–5.1)
Sodium: 143 mmol/L (ref 135–145)
TCO2: 27 mmol/L (ref 0–100)

## 2015-10-29 DIAGNOSIS — N2581 Secondary hyperparathyroidism of renal origin: Secondary | ICD-10-CM | POA: Diagnosis not present

## 2015-10-29 DIAGNOSIS — N186 End stage renal disease: Secondary | ICD-10-CM | POA: Diagnosis not present

## 2015-10-29 DIAGNOSIS — E1022 Type 1 diabetes mellitus with diabetic chronic kidney disease: Secondary | ICD-10-CM | POA: Diagnosis not present

## 2015-10-31 DIAGNOSIS — N186 End stage renal disease: Secondary | ICD-10-CM | POA: Diagnosis not present

## 2015-10-31 DIAGNOSIS — N2581 Secondary hyperparathyroidism of renal origin: Secondary | ICD-10-CM | POA: Diagnosis not present

## 2015-11-03 DIAGNOSIS — N186 End stage renal disease: Secondary | ICD-10-CM | POA: Diagnosis not present

## 2015-11-03 DIAGNOSIS — N2581 Secondary hyperparathyroidism of renal origin: Secondary | ICD-10-CM | POA: Diagnosis not present

## 2015-11-05 DIAGNOSIS — N186 End stage renal disease: Secondary | ICD-10-CM | POA: Diagnosis not present

## 2015-11-05 DIAGNOSIS — Z992 Dependence on renal dialysis: Secondary | ICD-10-CM | POA: Diagnosis not present

## 2015-11-05 DIAGNOSIS — E1122 Type 2 diabetes mellitus with diabetic chronic kidney disease: Secondary | ICD-10-CM | POA: Diagnosis not present

## 2015-11-05 DIAGNOSIS — N2581 Secondary hyperparathyroidism of renal origin: Secondary | ICD-10-CM | POA: Diagnosis not present

## 2015-11-06 ENCOUNTER — Telehealth: Payer: Self-pay | Admitting: Endocrinology

## 2015-11-06 ENCOUNTER — Encounter: Payer: Self-pay | Admitting: Endocrinology

## 2015-11-06 ENCOUNTER — Ambulatory Visit (INDEPENDENT_AMBULATORY_CARE_PROVIDER_SITE_OTHER): Payer: Medicare Other | Admitting: Endocrinology

## 2015-11-06 VITALS — Ht 71.0 in | Wt 188.0 lb

## 2015-11-06 DIAGNOSIS — E1022 Type 1 diabetes mellitus with diabetic chronic kidney disease: Secondary | ICD-10-CM

## 2015-11-06 DIAGNOSIS — N186 End stage renal disease: Secondary | ICD-10-CM

## 2015-11-06 DIAGNOSIS — Z992 Dependence on renal dialysis: Secondary | ICD-10-CM

## 2015-11-06 LAB — POCT GLYCOSYLATED HEMOGLOBIN (HGB A1C): HEMOGLOBIN A1C: 9.6

## 2015-11-06 MED ORDER — INSULIN NPH (HUMAN) (ISOPHANE) 100 UNIT/ML ~~LOC~~ SUSP: 35.0000 [IU] | SUBCUTANEOUS | 11 refills | Status: DC

## 2015-11-06 MED ORDER — GLUCOSE BLOOD VI STRP: ORAL_STRIP | 2 refills | Status: DC

## 2015-11-06 NOTE — Telephone Encounter (Signed)
Pharmacy called and said that they need a new script for the One Touch Verio sent over with a Dx code.

## 2015-11-06 NOTE — Telephone Encounter (Signed)
Refill submitted. 

## 2015-11-06 NOTE — Patient Instructions (Addendum)
check your blood sugar 4 times a day: before the 3 meals, and at bedtime.  also check if you have symptoms of your blood sugar being too high or too low.  please keep a record of the readings and bring it to your next appointment here (or you can bring the meter itself).  You can write it on any piece of paper.  please call us sooner if your blood sugar goes below 70, or if you have a lot of readings over 200.   Please change lantus to NPH, 35 units on non-dialysis days, and 30 units on dialysis days.  Please come back for a follow-up appointment in 2 months.

## 2015-11-06 NOTE — Progress Notes (Signed)
Subjective:    Patient ID: Edward Colon, male    DOB: 02/04/1981, 35 y.o.   MRN: [id number]  HPI DM type: 1 Dx'ed: 0000000 Complications: retinopathy and ESRD Therapy: insulin since dx DKA: last episode was in approx 2007 Severe hypoglycemia:  last episode was in early 2017 Pancreatitis: never. Other: he takes qd insulin, after poor results with multiple daily injections; he was changed from lantus to NPH, due to daily pattern of cbg's.  he works on a food truck.    Interval history: He takes lantus, 30 units qam.  no cbg record, but states cbg's vary from 80-300.  It is in general higher as the day goes on.  It is lower on dialysis days. He was recently incarcerated, and insulin was changed back to Lantus. Past Medical History:  Diagnosis Date  . Anemia   . Blind one eye    left eye  . Chronic kidney disease   . Diabetes mellitus without complication ([place])   . Heart murmur    denies any problems with it  . Hypertension   . Neuropathy ([place])   . Seizures ([place])    none for 2 months as of 03/31/15  . Type 1 diabetes [facility])     Past Surgical History:  Procedure Laterality Date  . AV FISTULA PLACEMENT Right 03/03/2015   Procedure: RIGHT RADIO-CEPHALIC ARTERIOVENOUS (AV) FISTULA CREATION;  Surgeon: Serafina Mitchell, MD;  Location: [facility] OR;  Service: Vascular;  Laterality: Right;  . AV FISTULA PLACEMENT Left 06/15/2015   Procedure: INSERTION OF LEFT UPPER ARM  ARTERIOVENOUS (AV) 12mm x 50cm GORE-TEX GRAFT;  Surgeon: Elam Dutch, MD;  Location: [place];  Service: Vascular;  Laterality: Left;  . BASCILIC VEIN TRANSPOSITION Left 04/01/2015   Procedure: BASCILIC VEIN TRANSPOSITION-LEFT ARM- FIRST STAGE;  Surgeon: Elam Dutch, MD;  Location: [place];  Service: Vascular;  Laterality: Left;  . EYE SURGERY Right    for diabetic retinopathy  . INSERTION OF DIALYSIS CATHETER Right 03/03/2015   Procedure: INSERTION OF DIALYSIS CATHETER;  Surgeon: Serafina Mitchell, MD;  Location: [facility] OR;   Service: Vascular;  Laterality: Right;    Social History   Social History  . Marital status: Single    Spouse name: N/A  . Number of children: N/A  . Years of education: N/A   Occupational History  . Not on file.   Social History Main Topics  . Smoking status: Never Smoker  . Smokeless tobacco: Never Used  . Alcohol use Yes     Comment: rare  . Drug use: No  . Sexual activity: No   Other Topics Concern  . Not on file   Social History Narrative  . No narrative on file    Current Outpatient Prescriptions on File Prior to Visit  Medication Sig Dispense Refill  . calcium acetate (PHOSLO) 667 MG capsule Take 1 capsule (667 mg total) by mouth 3 (three) times daily with meals. (Patient taking differently: Take 2,001 mg by mouth 3 (three) times daily with meals. Take 3 capsules (1334 mg - 2001 mg) with meals (depending on size of meal) and 1 capsule (667 mg) with snacks) 90 capsule 0  . LANTUS 100 UNIT/ML injection INJECT 30 UNITS INTO THE SKIN EVERY DAY BEFORE BREAKFAST 10 mL 1  . multivitamin (RENA-VIT) TABS tablet Take 1 tablet by mouth at bedtime. (Patient taking differently: Take 1 tablet by mouth daily. ) 30 tablet 0  . ONETOUCH DELICA LANCETS 99991111 MISC Use  check blood sugar 4 times per day. Dx code: E11.9 120 each 2  . insulin lispro (HUMALOG) 100 UNIT/ML injection Inject 2-10 Units into the skin 3 (three) times daily with meals. Uses sliding scale    . oxyCODONE (ROXICODONE) 5 MG immediate release tablet Take 1 tablet (5 mg total) by mouth every 6 (six) hours as needed. (Patient not taking: Reported on 08/21/2015) 30 tablet 0   No current facility-administered medications on file prior to visit.     No Known Allergies  Family History  Problem Relation Age of Onset  . Diabetes Neg Hx     Ht 5\' 11"  (1.803 m)   Wt 188 lb (85.3 kg)   BMI 26.22 kg/m    Review of Systems He denies hypoglycemia.     Objective:   Physical Exam VITAL SIGNS:  See vs page GENERAL: no  distress Pulses: dorsalis pedis intact bilat.   MSK: no deformity of the feet CV: no leg edema.  Skin:  no ulcer on the feet.  normal color and temp on the feet. Neuro: sensation is intact to touch on the feet.  A1c=9.6%     Assessment & Plan:  Type 1 DM: he needs increased rx. ESRD: in this setting, he has found he needs less insulin on dialysis days.  Legal circumstance: this has limited the rx of his DM, but it is better now.

## 2015-11-07 DIAGNOSIS — N186 End stage renal disease: Secondary | ICD-10-CM | POA: Diagnosis not present

## 2015-11-07 DIAGNOSIS — N2581 Secondary hyperparathyroidism of renal origin: Secondary | ICD-10-CM | POA: Diagnosis not present

## 2015-11-07 DIAGNOSIS — Z23 Encounter for immunization: Secondary | ICD-10-CM | POA: Diagnosis not present

## 2015-11-10 DIAGNOSIS — N2581 Secondary hyperparathyroidism of renal origin: Secondary | ICD-10-CM | POA: Diagnosis not present

## 2015-11-10 DIAGNOSIS — N186 End stage renal disease: Secondary | ICD-10-CM | POA: Diagnosis not present

## 2015-11-10 DIAGNOSIS — Z23 Encounter for immunization: Secondary | ICD-10-CM | POA: Diagnosis not present

## 2015-11-12 DIAGNOSIS — Z23 Encounter for immunization: Secondary | ICD-10-CM | POA: Diagnosis not present

## 2015-11-12 DIAGNOSIS — N2581 Secondary hyperparathyroidism of renal origin: Secondary | ICD-10-CM | POA: Diagnosis not present

## 2015-11-12 DIAGNOSIS — N186 End stage renal disease: Secondary | ICD-10-CM | POA: Diagnosis not present

## 2015-11-14 DIAGNOSIS — Z23 Encounter for immunization: Secondary | ICD-10-CM | POA: Diagnosis not present

## 2015-11-14 DIAGNOSIS — N186 End stage renal disease: Secondary | ICD-10-CM | POA: Diagnosis not present

## 2015-11-14 DIAGNOSIS — N2581 Secondary hyperparathyroidism of renal origin: Secondary | ICD-10-CM | POA: Diagnosis not present

## 2015-11-14 DIAGNOSIS — Z992 Dependence on renal dialysis: Secondary | ICD-10-CM | POA: Diagnosis not present

## 2015-11-17 DIAGNOSIS — N2581 Secondary hyperparathyroidism of renal origin: Secondary | ICD-10-CM | POA: Diagnosis not present

## 2015-11-17 DIAGNOSIS — Z23 Encounter for immunization: Secondary | ICD-10-CM | POA: Diagnosis not present

## 2015-11-17 DIAGNOSIS — N186 End stage renal disease: Secondary | ICD-10-CM | POA: Diagnosis not present

## 2015-11-19 DIAGNOSIS — Z23 Encounter for immunization: Secondary | ICD-10-CM | POA: Diagnosis not present

## 2015-11-19 DIAGNOSIS — N186 End stage renal disease: Secondary | ICD-10-CM | POA: Diagnosis not present

## 2015-11-19 DIAGNOSIS — N2581 Secondary hyperparathyroidism of renal origin: Secondary | ICD-10-CM | POA: Diagnosis not present

## 2015-11-21 DIAGNOSIS — N2581 Secondary hyperparathyroidism of renal origin: Secondary | ICD-10-CM | POA: Diagnosis not present

## 2015-11-21 DIAGNOSIS — N186 End stage renal disease: Secondary | ICD-10-CM | POA: Diagnosis not present

## 2015-11-21 DIAGNOSIS — Z23 Encounter for immunization: Secondary | ICD-10-CM | POA: Diagnosis not present

## 2015-11-24 DIAGNOSIS — N2581 Secondary hyperparathyroidism of renal origin: Secondary | ICD-10-CM | POA: Diagnosis not present

## 2015-11-24 DIAGNOSIS — Z23 Encounter for immunization: Secondary | ICD-10-CM | POA: Diagnosis not present

## 2015-11-24 DIAGNOSIS — N186 End stage renal disease: Secondary | ICD-10-CM | POA: Diagnosis not present

## 2015-11-26 DIAGNOSIS — N2581 Secondary hyperparathyroidism of renal origin: Secondary | ICD-10-CM | POA: Diagnosis not present

## 2015-11-26 DIAGNOSIS — Z23 Encounter for immunization: Secondary | ICD-10-CM | POA: Diagnosis not present

## 2015-11-26 DIAGNOSIS — N186 End stage renal disease: Secondary | ICD-10-CM | POA: Diagnosis not present

## 2015-11-28 DIAGNOSIS — N186 End stage renal disease: Secondary | ICD-10-CM | POA: Diagnosis not present

## 2015-11-28 DIAGNOSIS — N2581 Secondary hyperparathyroidism of renal origin: Secondary | ICD-10-CM | POA: Diagnosis not present

## 2015-11-28 DIAGNOSIS — Z23 Encounter for immunization: Secondary | ICD-10-CM | POA: Diagnosis not present

## 2015-12-01 DIAGNOSIS — Z23 Encounter for immunization: Secondary | ICD-10-CM | POA: Diagnosis not present

## 2015-12-01 DIAGNOSIS — N2581 Secondary hyperparathyroidism of renal origin: Secondary | ICD-10-CM | POA: Diagnosis not present

## 2015-12-01 DIAGNOSIS — N186 End stage renal disease: Secondary | ICD-10-CM | POA: Diagnosis not present

## 2015-12-03 DIAGNOSIS — N186 End stage renal disease: Secondary | ICD-10-CM | POA: Diagnosis not present

## 2015-12-03 DIAGNOSIS — Z23 Encounter for immunization: Secondary | ICD-10-CM | POA: Diagnosis not present

## 2015-12-03 DIAGNOSIS — N2581 Secondary hyperparathyroidism of renal origin: Secondary | ICD-10-CM | POA: Diagnosis not present

## 2015-12-05 DIAGNOSIS — N186 End stage renal disease: Secondary | ICD-10-CM | POA: Diagnosis not present

## 2015-12-05 DIAGNOSIS — E1122 Type 2 diabetes mellitus with diabetic chronic kidney disease: Secondary | ICD-10-CM | POA: Diagnosis not present

## 2015-12-05 DIAGNOSIS — Z992 Dependence on renal dialysis: Secondary | ICD-10-CM | POA: Diagnosis not present

## 2015-12-07 DIAGNOSIS — N2581 Secondary hyperparathyroidism of renal origin: Secondary | ICD-10-CM | POA: Diagnosis not present

## 2015-12-07 DIAGNOSIS — E1129 Type 2 diabetes mellitus with other diabetic kidney complication: Secondary | ICD-10-CM | POA: Diagnosis not present

## 2015-12-07 DIAGNOSIS — N186 End stage renal disease: Secondary | ICD-10-CM | POA: Diagnosis not present

## 2015-12-08 DIAGNOSIS — E1129 Type 2 diabetes mellitus with other diabetic kidney complication: Secondary | ICD-10-CM | POA: Diagnosis not present

## 2015-12-08 DIAGNOSIS — N2581 Secondary hyperparathyroidism of renal origin: Secondary | ICD-10-CM | POA: Diagnosis not present

## 2015-12-08 DIAGNOSIS — N186 End stage renal disease: Secondary | ICD-10-CM | POA: Diagnosis not present

## 2015-12-10 DIAGNOSIS — E1129 Type 2 diabetes mellitus with other diabetic kidney complication: Secondary | ICD-10-CM | POA: Diagnosis not present

## 2015-12-10 DIAGNOSIS — N2581 Secondary hyperparathyroidism of renal origin: Secondary | ICD-10-CM | POA: Diagnosis not present

## 2015-12-10 DIAGNOSIS — N186 End stage renal disease: Secondary | ICD-10-CM | POA: Diagnosis not present

## 2015-12-12 DIAGNOSIS — N186 End stage renal disease: Secondary | ICD-10-CM | POA: Diagnosis not present

## 2015-12-12 DIAGNOSIS — N2581 Secondary hyperparathyroidism of renal origin: Secondary | ICD-10-CM | POA: Diagnosis not present

## 2015-12-12 DIAGNOSIS — E1129 Type 2 diabetes mellitus with other diabetic kidney complication: Secondary | ICD-10-CM | POA: Diagnosis not present

## 2015-12-15 DIAGNOSIS — N2581 Secondary hyperparathyroidism of renal origin: Secondary | ICD-10-CM | POA: Diagnosis not present

## 2015-12-15 DIAGNOSIS — N186 End stage renal disease: Secondary | ICD-10-CM | POA: Diagnosis not present

## 2015-12-15 DIAGNOSIS — E1129 Type 2 diabetes mellitus with other diabetic kidney complication: Secondary | ICD-10-CM | POA: Diagnosis not present

## 2015-12-17 DIAGNOSIS — N186 End stage renal disease: Secondary | ICD-10-CM | POA: Diagnosis not present

## 2015-12-17 DIAGNOSIS — E1129 Type 2 diabetes mellitus with other diabetic kidney complication: Secondary | ICD-10-CM | POA: Diagnosis not present

## 2015-12-17 DIAGNOSIS — N2581 Secondary hyperparathyroidism of renal origin: Secondary | ICD-10-CM | POA: Diagnosis not present

## 2015-12-19 DIAGNOSIS — E1129 Type 2 diabetes mellitus with other diabetic kidney complication: Secondary | ICD-10-CM | POA: Diagnosis not present

## 2015-12-19 DIAGNOSIS — N186 End stage renal disease: Secondary | ICD-10-CM | POA: Diagnosis not present

## 2015-12-19 DIAGNOSIS — N2581 Secondary hyperparathyroidism of renal origin: Secondary | ICD-10-CM | POA: Diagnosis not present

## 2015-12-22 DIAGNOSIS — E1129 Type 2 diabetes mellitus with other diabetic kidney complication: Secondary | ICD-10-CM | POA: Diagnosis not present

## 2015-12-22 DIAGNOSIS — N2581 Secondary hyperparathyroidism of renal origin: Secondary | ICD-10-CM | POA: Diagnosis not present

## 2015-12-22 DIAGNOSIS — N186 End stage renal disease: Secondary | ICD-10-CM | POA: Diagnosis not present

## 2015-12-24 DIAGNOSIS — E1129 Type 2 diabetes mellitus with other diabetic kidney complication: Secondary | ICD-10-CM | POA: Diagnosis not present

## 2015-12-24 DIAGNOSIS — N2581 Secondary hyperparathyroidism of renal origin: Secondary | ICD-10-CM | POA: Diagnosis not present

## 2015-12-24 DIAGNOSIS — N186 End stage renal disease: Secondary | ICD-10-CM | POA: Diagnosis not present

## 2015-12-28 DIAGNOSIS — E1129 Type 2 diabetes mellitus with other diabetic kidney complication: Secondary | ICD-10-CM | POA: Diagnosis not present

## 2015-12-28 DIAGNOSIS — N186 End stage renal disease: Secondary | ICD-10-CM | POA: Diagnosis not present

## 2015-12-28 DIAGNOSIS — N2581 Secondary hyperparathyroidism of renal origin: Secondary | ICD-10-CM | POA: Diagnosis not present

## 2015-12-29 DIAGNOSIS — N2581 Secondary hyperparathyroidism of renal origin: Secondary | ICD-10-CM | POA: Diagnosis not present

## 2015-12-29 DIAGNOSIS — E1129 Type 2 diabetes mellitus with other diabetic kidney complication: Secondary | ICD-10-CM | POA: Diagnosis not present

## 2015-12-29 DIAGNOSIS — N186 End stage renal disease: Secondary | ICD-10-CM | POA: Diagnosis not present

## 2015-12-31 DIAGNOSIS — N186 End stage renal disease: Secondary | ICD-10-CM | POA: Diagnosis not present

## 2015-12-31 DIAGNOSIS — N2581 Secondary hyperparathyroidism of renal origin: Secondary | ICD-10-CM | POA: Diagnosis not present

## 2015-12-31 DIAGNOSIS — E1129 Type 2 diabetes mellitus with other diabetic kidney complication: Secondary | ICD-10-CM | POA: Diagnosis not present

## 2016-01-04 DIAGNOSIS — N186 End stage renal disease: Secondary | ICD-10-CM | POA: Diagnosis not present

## 2016-01-04 DIAGNOSIS — N2581 Secondary hyperparathyroidism of renal origin: Secondary | ICD-10-CM | POA: Diagnosis not present

## 2016-01-04 DIAGNOSIS — E1129 Type 2 diabetes mellitus with other diabetic kidney complication: Secondary | ICD-10-CM | POA: Diagnosis not present

## 2016-01-05 DIAGNOSIS — E1129 Type 2 diabetes mellitus with other diabetic kidney complication: Secondary | ICD-10-CM | POA: Diagnosis not present

## 2016-01-05 DIAGNOSIS — N2581 Secondary hyperparathyroidism of renal origin: Secondary | ICD-10-CM | POA: Diagnosis not present

## 2016-01-05 DIAGNOSIS — Z992 Dependence on renal dialysis: Secondary | ICD-10-CM | POA: Diagnosis not present

## 2016-01-05 DIAGNOSIS — N186 End stage renal disease: Secondary | ICD-10-CM | POA: Diagnosis not present

## 2016-01-05 DIAGNOSIS — E1122 Type 2 diabetes mellitus with diabetic chronic kidney disease: Secondary | ICD-10-CM | POA: Diagnosis not present

## 2016-01-07 DIAGNOSIS — N2581 Secondary hyperparathyroidism of renal origin: Secondary | ICD-10-CM | POA: Diagnosis not present

## 2016-01-07 DIAGNOSIS — D631 Anemia in chronic kidney disease: Secondary | ICD-10-CM | POA: Diagnosis not present

## 2016-01-07 DIAGNOSIS — D509 Iron deficiency anemia, unspecified: Secondary | ICD-10-CM | POA: Diagnosis not present

## 2016-01-07 DIAGNOSIS — N186 End stage renal disease: Secondary | ICD-10-CM | POA: Diagnosis not present

## 2016-01-08 ENCOUNTER — Encounter: Payer: Self-pay | Admitting: Endocrinology

## 2016-01-08 ENCOUNTER — Ambulatory Visit (INDEPENDENT_AMBULATORY_CARE_PROVIDER_SITE_OTHER): Payer: Medicare Other | Admitting: Endocrinology

## 2016-01-08 VITALS — BP 136/84 | HR 87 | Ht 71.0 in | Wt 200.0 lb

## 2016-01-08 DIAGNOSIS — Z992 Dependence on renal dialysis: Secondary | ICD-10-CM

## 2016-01-08 DIAGNOSIS — E1022 Type 1 diabetes mellitus with diabetic chronic kidney disease: Secondary | ICD-10-CM

## 2016-01-08 DIAGNOSIS — D631 Anemia in chronic kidney disease: Secondary | ICD-10-CM | POA: Diagnosis not present

## 2016-01-08 DIAGNOSIS — N186 End stage renal disease: Secondary | ICD-10-CM | POA: Diagnosis not present

## 2016-01-08 DIAGNOSIS — E10319 Type 1 diabetes mellitus with unspecified diabetic retinopathy without macular edema: Secondary | ICD-10-CM

## 2016-01-08 DIAGNOSIS — N2581 Secondary hyperparathyroidism of renal origin: Secondary | ICD-10-CM | POA: Diagnosis not present

## 2016-01-08 DIAGNOSIS — D509 Iron deficiency anemia, unspecified: Secondary | ICD-10-CM | POA: Diagnosis not present

## 2016-01-08 LAB — POCT GLYCOSYLATED HEMOGLOBIN (HGB A1C): Hemoglobin A1C: 9.1

## 2016-01-08 MED ORDER — INSULIN NPH (HUMAN) (ISOPHANE) 100 UNIT/ML ~~LOC~~ SUSP: 40.0000 [IU] | SUBCUTANEOUS | 3 refills | Status: DC

## 2016-01-08 MED ORDER — INSULIN LISPRO 100 UNIT/ML ~~LOC~~ SOLN: 2.0000 [IU] | Freq: Three times a day (TID) | SUBCUTANEOUS | 3 refills | Status: DC

## 2016-01-08 MED ORDER — GLUCOSE BLOOD VI STRP: 1.0000 | ORAL_STRIP | Freq: Four times a day (QID) | 3 refills | Status: DC

## 2016-01-08 MED ORDER — INSULIN NPH (HUMAN) (ISOPHANE) 100 UNIT/ML ~~LOC~~ SUSP: 40.0000 [IU] | SUBCUTANEOUS | 11 refills | Status: DC

## 2016-01-08 NOTE — Patient Instructions (Addendum)
check your blood sugar 4 times a day: before the 3 meals, and at bedtime.  also check if you have symptoms of your blood sugar being too high or too low.  please keep a record of the readings and bring it to your next appointment here (or you can bring the meter itself).  You can write it on any piece of paper.  please call us sooner if your blood sugar goes below 70, or if you have a lot of readings over 200.   On this type of insulin schedule, you should eat meals on a regular schedule.  If a meal is missed or significantly delayed, your blood sugar could go low. Please increase the NPH to 40 units on non-dialysis days, and 30 units on dialysis days.  Try to minimize the humalog as mush as you can.  Please come back for a follow-up appointment in 3 months.

## 2016-01-08 NOTE — Progress Notes (Signed)
Subjective:    Patient ID: Edward Colon, male    DOB: [birth year], 35 y.o.   MRN: [id number]  HPI DM type: 1 Dx'ed: 6433 Complications: retinopathy and ESRD Therapy: insulin since dx DKA: last episode was in approx 2007 Severe hypoglycemia:  last episode was in early 2017 Pancreatitis: never. Other: he takes qd insulin, after poor results with multiple daily injections; he was changed from lantus to NPH, due to daily pattern of cbg's.  he works on a food truck.    Interval history: no cbg record, but states cbg's are in general lowest in the afternoon.  It is still lower on dialysis days.  He seldom has hypoglycemia, and these episodes are mild.  He seldom takes the humalog.  He says he never misses the NPH Past Medical History:  Diagnosis Date  . Anemia   . Blind one eye    left eye  . Chronic kidney disease   . Diabetes mellitus without complication ([place])   . Heart murmur    denies any problems with it  . Hypertension   . Neuropathy ([place])   . Seizures ([place])    none for 2 months as of 03/31/15  . Type 1 diabetes [facility])     Past Surgical History:  Procedure Laterality Date  . AV FISTULA PLACEMENT Right 03/03/2015   Procedure: RIGHT RADIO-CEPHALIC ARTERIOVENOUS (AV) FISTULA CREATION;  Surgeon: Serafina Mitchell, MD;  Location[facility] OR;  Service: Vascular;  Laterality: Right;  . AV FISTULA PLACEMENT Left 06/15/2015   Procedure: INSERTION OF LEFT UPPER ARM  ARTERIOVENOUS (AV) 87mm x 50cm GORE-TEX GRAFT;  Surgeon: Elam Dutch, MD;  Location: [place];  Service: Vascular;  Laterality: Left;  . BASCILIC VEIN TRANSPOSITION Left 04/01/2015   Procedure: BASCILIC VEIN TRANSPOSITION-LEFT ARM- FIRST STAGE;  Surgeon: Elam Dutch, MD;  Location: [place];  Service: Vascular;  Laterality: Left;  . EYE SURGERY Right    for diabetic retinopathy  . INSERTION OF DIALYSIS CATHETER Right 03/03/2015   Procedure: INSERTION OF DIALYSIS CATHETER;  Surgeon: Serafina Mitchell, MD;  Location: [facility] OR;   Service: Vascular;  Laterality: Right;    Social History   Social History  . Marital status: Single    Spouse name: N/A  . Number of children: N/A  . Years of education: N/A   Occupational History  . Not on file.   Social History Main Topics  . Smoking status: Never Smoker  . Smokeless tobacco: Never Used  . Alcohol use Yes     Comment: rare  . Drug use: No  . Sexual activity: No   Other Topics Concern  . Not on file   Social History Narrative  . No narrative on file    Current Outpatient Prescriptions on File Prior to Visit  Medication Sig Dispense Refill  . calcium acetate (PHOSLO) 667 MG capsule Take 1 capsule (667 mg total) by mouth 3 (three) times daily with meals. (Patient taking differently: Take 2,001 mg by mouth 3 (three) times daily with meals. Take 3 capsules (1334 mg - 2001 mg) with meals (depending on size of meal) and 1 capsule (667 mg) with snacks) 90 capsule 0  . hydrALAZINE (APRESOLINE) 100 MG tablet Take 100 mg by mouth daily. 1/2 tab  5  . multivitamin (RENA-VIT) TABS tablet Take 1 tablet by mouth at bedtime. (Patient taking differently: Take 1 tablet by mouth daily. ) 30 tablet 0  . ONETOUCH DELICA LANCETS 29J MISC Use check blood  sugar 4 times per day. Dx code: E11.9 120 each 2   No current facility-administered medications on file prior to visit.     No Known Allergies  Family History  Problem Relation Age of Onset  . Diabetes Neg Hx    BP 136/84   Pulse 87   Ht 5\' 11"  (1.803 m)   Wt 200 lb (90.7 kg)   SpO2 98%   BMI 27.89 kg/m   Review of Systems Denies LOC    Objective:   Physical Exam VITAL SIGNS:  See vs page GENERAL: no distress Pulses: dorsalis pedis intact bilat.   MSK: no deformity of the feet CV: no leg edema Skin:  no ulcer on the feet.  normal color and temp on the feet. Neuro: sensation is intact to touch on the feet  Lab Results  Component Value Date   HGBA1C 9.1 01/08/2016      Assessment & Plan:  Type 1 DM,  with retinopathy: he needs increased rx.   ESRD: in this setting, he will have a long duration of action of insulin.   Patient is advised the following: Patient Instructions  check your blood sugar 4 times a day: before the 3 meals, and at bedtime.  also check if you have symptoms of your blood sugar being too high or too low.  please keep a record of the readings and bring it to your next appointment here (or you can bring the meter itself).  You can write it on any piece of paper.  please call us sooner if your blood sugar goes below 70, or if you have a lot of readings over 200.   On this type of insulin schedule, you should eat meals on a regular schedule.  If a meal is missed or significantly delayed, your blood sugar could go low. Please increase the NPH to 40 units on non-dialysis days, and 30 units on dialysis days.  Try to minimize the humalog as mush as you can.  Please come back for a follow-up appointment in 3 months.

## 2016-01-12 ENCOUNTER — Telehealth: Payer: Self-pay | Admitting: Endocrinology

## 2016-01-12 DIAGNOSIS — N2581 Secondary hyperparathyroidism of renal origin: Secondary | ICD-10-CM | POA: Diagnosis not present

## 2016-01-12 DIAGNOSIS — D509 Iron deficiency anemia, unspecified: Secondary | ICD-10-CM | POA: Diagnosis not present

## 2016-01-12 DIAGNOSIS — D631 Anemia in chronic kidney disease: Secondary | ICD-10-CM | POA: Diagnosis not present

## 2016-01-12 DIAGNOSIS — N186 End stage renal disease: Secondary | ICD-10-CM | POA: Diagnosis not present

## 2016-01-12 MED ORDER — GLUCOSE BLOOD VI STRP: 1.0000 | ORAL_STRIP | Freq: Four times a day (QID) | 3 refills | Status: DC

## 2016-01-12 NOTE — Telephone Encounter (Signed)
Refill submitted. 

## 2016-01-12 NOTE — Telephone Encounter (Signed)
Pharmacy called to let you know they needs a new script for the Test Strips with the Dx code.

## 2016-01-14 DIAGNOSIS — N2581 Secondary hyperparathyroidism of renal origin: Secondary | ICD-10-CM | POA: Diagnosis not present

## 2016-01-14 DIAGNOSIS — D631 Anemia in chronic kidney disease: Secondary | ICD-10-CM | POA: Diagnosis not present

## 2016-01-14 DIAGNOSIS — D509 Iron deficiency anemia, unspecified: Secondary | ICD-10-CM | POA: Diagnosis not present

## 2016-01-14 DIAGNOSIS — N186 End stage renal disease: Secondary | ICD-10-CM | POA: Diagnosis not present

## 2016-01-18 DIAGNOSIS — D631 Anemia in chronic kidney disease: Secondary | ICD-10-CM | POA: Diagnosis not present

## 2016-01-18 DIAGNOSIS — N2581 Secondary hyperparathyroidism of renal origin: Secondary | ICD-10-CM | POA: Diagnosis not present

## 2016-01-18 DIAGNOSIS — N186 End stage renal disease: Secondary | ICD-10-CM | POA: Diagnosis not present

## 2016-01-18 DIAGNOSIS — D509 Iron deficiency anemia, unspecified: Secondary | ICD-10-CM | POA: Diagnosis not present

## 2016-01-21 DIAGNOSIS — D631 Anemia in chronic kidney disease: Secondary | ICD-10-CM | POA: Diagnosis not present

## 2016-01-21 DIAGNOSIS — E1022 Type 1 diabetes mellitus with diabetic chronic kidney disease: Secondary | ICD-10-CM | POA: Diagnosis not present

## 2016-01-21 DIAGNOSIS — N186 End stage renal disease: Secondary | ICD-10-CM | POA: Diagnosis not present

## 2016-01-21 DIAGNOSIS — N2581 Secondary hyperparathyroidism of renal origin: Secondary | ICD-10-CM | POA: Diagnosis not present

## 2016-01-21 DIAGNOSIS — D509 Iron deficiency anemia, unspecified: Secondary | ICD-10-CM | POA: Diagnosis not present

## 2016-01-27 ENCOUNTER — Encounter (HOSPITAL_COMMUNITY): Payer: Self-pay | Admitting: *Deleted

## 2016-01-27 DIAGNOSIS — E1022 Type 1 diabetes mellitus with diabetic chronic kidney disease: Secondary | ICD-10-CM

## 2016-01-27 DIAGNOSIS — R4182 Altered mental status, unspecified: Secondary | ICD-10-CM | POA: Diagnosis not present

## 2016-01-27 DIAGNOSIS — E1042 Type 1 diabetes mellitus with diabetic polyneuropathy: Secondary | ICD-10-CM | POA: Diagnosis not present

## 2016-01-27 DIAGNOSIS — R079 Chest pain, unspecified: Secondary | ICD-10-CM

## 2016-01-27 DIAGNOSIS — Z992 Dependence on renal dialysis: Secondary | ICD-10-CM

## 2016-01-27 DIAGNOSIS — T1491XA Suicide attempt, initial encounter: Secondary | ICD-10-CM | POA: Diagnosis not present

## 2016-01-27 DIAGNOSIS — Z23 Encounter for immunization: Secondary | ICD-10-CM | POA: Diagnosis not present

## 2016-01-27 DIAGNOSIS — I120 Hypertensive chronic kidney disease with stage 5 chronic kidney disease or end stage renal disease: Secondary | ICD-10-CM

## 2016-01-27 DIAGNOSIS — N186 End stage renal disease: Secondary | ICD-10-CM

## 2016-01-27 DIAGNOSIS — Z79899 Other long term (current) drug therapy: Secondary | ICD-10-CM

## 2016-01-27 DIAGNOSIS — R0602 Shortness of breath: Secondary | ICD-10-CM | POA: Diagnosis not present

## 2016-01-27 DIAGNOSIS — T383X2A Poisoning by insulin and oral hypoglycemic [antidiabetic] drugs, intentional self-harm, initial encounter: Secondary | ICD-10-CM | POA: Diagnosis not present

## 2016-01-27 DIAGNOSIS — R05 Cough: Secondary | ICD-10-CM | POA: Diagnosis not present

## 2016-01-27 LAB — CBC
HCT: 31.1 % — ABNORMAL LOW (ref 39.0–52.0)
Hemoglobin: 10 g/dL — ABNORMAL LOW (ref 13.0–17.0)
MCH: 29.2 pg (ref 26.0–34.0)
MCHC: 32.2 g/dL (ref 30.0–36.0)
MCV: 90.7 fL (ref 78.0–100.0)
PLATELETS: 248 10*3/uL (ref 150–400)
RBC: 3.43 MIL/uL — ABNORMAL LOW (ref 4.22–5.81)
RDW: 13.9 % (ref 11.5–15.5)
WBC: 5.4 10*3/uL (ref 4.0–10.5)

## 2016-01-27 NOTE — ED Triage Notes (Signed)
The pt was last dialyzed last Tuesday a week ago  He has been working and he cannot find the time  He has had a cough for 2 months also  Graft lt upper arm

## 2016-01-28 ENCOUNTER — Emergency Department (HOSPITAL_COMMUNITY): Payer: Medicare Other

## 2016-01-28 ENCOUNTER — Other Ambulatory Visit: Payer: Self-pay

## 2016-01-28 ENCOUNTER — Inpatient Hospital Stay (HOSPITAL_COMMUNITY)
Admission: EM | Admit: 2016-01-28 | Discharge: 2016-02-03 | DRG: 917 | Disposition: A | Discharge status: Home / Self Care | Payer: Medicare Other | Attending: Internal Medicine | Admitting: Internal Medicine

## 2016-01-28 ENCOUNTER — Emergency Department (HOSPITAL_COMMUNITY)
Admission: EM | Admit: 2016-01-28 | Discharge: 2016-01-28 | Disposition: A | Discharge status: Home / Self Care | Payer: Medicare Other | Source: Home / Self Care | Attending: Emergency Medicine | Admitting: Emergency Medicine

## 2016-01-28 DIAGNOSIS — R05 Cough: Secondary | ICD-10-CM | POA: Diagnosis not present

## 2016-01-28 DIAGNOSIS — H5462 Unqualified visual loss, left eye, normal vision right eye: Secondary | ICD-10-CM | POA: Diagnosis present

## 2016-01-28 DIAGNOSIS — E1069 Type 1 diabetes mellitus with other specified complication: Secondary | ICD-10-CM | POA: Diagnosis present

## 2016-01-28 DIAGNOSIS — T6592XA Toxic effect of unspecified substance, intentional self-harm, initial encounter: Secondary | ICD-10-CM | POA: Diagnosis present

## 2016-01-28 DIAGNOSIS — D649 Anemia, unspecified: Secondary | ICD-10-CM | POA: Diagnosis present

## 2016-01-28 DIAGNOSIS — T383X5A Adverse effect of insulin and oral hypoglycemic [antidiabetic] drugs, initial encounter: Secondary | ICD-10-CM

## 2016-01-28 DIAGNOSIS — Z046 Encounter for general psychiatric examination, requested by authority: Secondary | ICD-10-CM

## 2016-01-28 DIAGNOSIS — E10319 Type 1 diabetes mellitus with unspecified diabetic retinopathy without macular edema: Secondary | ICD-10-CM | POA: Diagnosis present

## 2016-01-28 DIAGNOSIS — Z9115 Patient's noncompliance with renal dialysis: Secondary | ICD-10-CM

## 2016-01-28 DIAGNOSIS — I10 Essential (primary) hypertension: Secondary | ICD-10-CM

## 2016-01-28 DIAGNOSIS — R4182 Altered mental status, unspecified: Secondary | ICD-10-CM | POA: Diagnosis not present

## 2016-01-28 DIAGNOSIS — E8770 Fluid overload, unspecified: Secondary | ICD-10-CM | POA: Diagnosis present

## 2016-01-28 DIAGNOSIS — I120 Hypertensive chronic kidney disease with stage 5 chronic kidney disease or end stage renal disease: Secondary | ICD-10-CM | POA: Diagnosis not present

## 2016-01-28 DIAGNOSIS — N186 End stage renal disease: Secondary | ICD-10-CM

## 2016-01-28 DIAGNOSIS — D638 Anemia in other chronic diseases classified elsewhere: Secondary | ICD-10-CM | POA: Diagnosis present

## 2016-01-28 DIAGNOSIS — E160 Drug-induced hypoglycemia without coma: Secondary | ICD-10-CM

## 2016-01-28 DIAGNOSIS — T383X2A Poisoning by insulin and oral hypoglycemic [antidiabetic] drugs, intentional self-harm, initial encounter: Principal | ICD-10-CM | POA: Diagnosis present

## 2016-01-28 DIAGNOSIS — F329 Major depressive disorder, single episode, unspecified: Secondary | ICD-10-CM | POA: Diagnosis present

## 2016-01-28 DIAGNOSIS — M898X9 Other specified disorders of bone, unspecified site: Secondary | ICD-10-CM | POA: Diagnosis present

## 2016-01-28 DIAGNOSIS — E1065 Type 1 diabetes mellitus with hyperglycemia: Secondary | ICD-10-CM | POA: Diagnosis not present

## 2016-01-28 DIAGNOSIS — Z79899 Other long term (current) drug therapy: Secondary | ICD-10-CM

## 2016-01-28 DIAGNOSIS — T1491XA Suicide attempt, initial encounter: Secondary | ICD-10-CM | POA: Diagnosis not present

## 2016-01-28 DIAGNOSIS — Z23 Encounter for immunization: Secondary | ICD-10-CM

## 2016-01-28 DIAGNOSIS — E162 Hypoglycemia, unspecified: Secondary | ICD-10-CM | POA: Diagnosis not present

## 2016-01-28 DIAGNOSIS — D631 Anemia in chronic kidney disease: Secondary | ICD-10-CM

## 2016-01-28 DIAGNOSIS — Z794 Long term (current) use of insulin: Secondary | ICD-10-CM

## 2016-01-28 DIAGNOSIS — E1022 Type 1 diabetes mellitus with diabetic chronic kidney disease: Secondary | ICD-10-CM | POA: Diagnosis present

## 2016-01-28 DIAGNOSIS — X788XXA Intentional self-harm by other sharp object, initial encounter: Secondary | ICD-10-CM | POA: Diagnosis present

## 2016-01-28 DIAGNOSIS — N2581 Secondary hyperparathyroidism of renal origin: Secondary | ICD-10-CM | POA: Diagnosis present

## 2016-01-28 DIAGNOSIS — Z992 Dependence on renal dialysis: Secondary | ICD-10-CM

## 2016-01-28 DIAGNOSIS — R0602 Shortness of breath: Secondary | ICD-10-CM | POA: Diagnosis not present

## 2016-01-28 DIAGNOSIS — E10649 Type 1 diabetes mellitus with hypoglycemia without coma: Secondary | ICD-10-CM | POA: Diagnosis present

## 2016-01-28 DIAGNOSIS — E1042 Type 1 diabetes mellitus with diabetic polyneuropathy: Secondary | ICD-10-CM | POA: Diagnosis present

## 2016-01-28 HISTORY — DX: End stage renal disease: N18.6

## 2016-01-28 HISTORY — DX: Blindness, one eye, unspecified eye: H54.40

## 2016-01-28 HISTORY — DX: Dependence on renal dialysis: Z99.2

## 2016-01-28 HISTORY — DX: Major depressive disorder, single episode, unspecified: F32.9

## 2016-01-28 HISTORY — DX: Type 2 diabetes mellitus with diabetic polyneuropathy: E11.42

## 2016-01-28 HISTORY — DX: Depression, unspecified: F32.A

## 2016-01-28 LAB — CBG MONITORING, ED
GLUCOSE-CAPILLARY: 84 mg/dL (ref 65–99)
GLUCOSE-CAPILLARY: 155 mg/dL — AB (ref 65–99)
Glucose-Capillary: 39 mg/dL — CL (ref 65–99)
Glucose-Capillary: 143 mg/dL — ABNORMAL HIGH (ref 65–99)
Glucose-Capillary: 144 mg/dL — ABNORMAL HIGH (ref 65–99)

## 2016-01-28 LAB — CBC WITH DIFFERENTIAL/PLATELET
BASOS PCT: 0 %
Basophils Absolute: 0 10*3/uL (ref 0.0–0.1)
EOS ABS: 0 10*3/uL (ref 0.0–0.7)
EOS PCT: 0 %
HEMATOCRIT: 32 % — AB (ref 39.0–52.0)
Hemoglobin: 10.5 g/dL — ABNORMAL LOW (ref 13.0–17.0)
Lymphocytes Relative: 11 %
Lymphs Abs: 0.7 10*3/uL (ref 0.7–4.0)
MCH: 29.6 pg (ref 26.0–34.0)
MCHC: 32.8 g/dL (ref 30.0–36.0)
MCV: 90.1 fL (ref 78.0–100.0)
MONO ABS: 0.4 10*3/uL (ref 0.1–1.0)
MONOS PCT: 6 %
Neutro Abs: 5.5 10*3/uL (ref 1.7–7.7)
Neutrophils Relative %: 83 %
PLATELETS: 254 10*3/uL (ref 150–400)
RBC: 3.55 MIL/uL — ABNORMAL LOW (ref 4.22–5.81)
RDW: 14.1 % (ref 11.5–15.5)
WBC: 6.7 10*3/uL (ref 4.0–10.5)

## 2016-01-28 LAB — COMPREHENSIVE METABOLIC PANEL
ALBUMIN: 3.6 g/dL (ref 3.5–5.0)
ALBUMIN: 3.6 g/dL (ref 3.5–5.0)
ALK PHOS: 81 U/L (ref 38–126)
ALK PHOS: 84 U/L (ref 38–126)
ALT: 56 U/L (ref 17–63)
ALT: 69 U/L — ABNORMAL HIGH (ref 17–63)
ANION GAP: 14 (ref 5–15)
AST: 41 U/L (ref 15–41)
AST: 71 U/L — AB (ref 15–41)
Anion gap: 12 (ref 5–15)
BILIRUBIN TOTAL: 0.7 mg/dL (ref 0.3–1.2)
BUN: 68 mg/dL — AB (ref 6–20)
BUN: 71 mg/dL — AB (ref 6–20)
CALCIUM: 8.5 mg/dL — AB (ref 8.9–10.3)
CO2: 18 mmol/L — AB (ref 22–32)
CO2: 19 mmol/L — ABNORMAL LOW (ref 22–32)
CREATININE: 9.44 mg/dL — AB (ref 0.61–1.24)
Calcium: 8.3 mg/dL — ABNORMAL LOW (ref 8.9–10.3)
Chloride: 107 mmol/L (ref 101–111)
Chloride: 111 mmol/L (ref 101–111)
Creatinine, Ser: 9.49 mg/dL — ABNORMAL HIGH (ref 0.61–1.24)
GFR calc Af Amer: 7 mL/min — ABNORMAL LOW (ref >60)
GFR calc Af Amer: 7 mL/min — ABNORMAL LOW (ref >60)
GFR calc non Af Amer: 6 mL/min — ABNORMAL LOW (ref >60)
GFR, EST NON AFRICAN AMERICAN: 6 mL/min — AB (ref >60)
GLUCOSE: 117 mg/dL — AB (ref 65–99)
GLUCOSE: 46 mg/dL — AB (ref 65–99)
POTASSIUM: 4.2 mmol/L (ref 3.5–5.1)
Potassium: 4.2 mmol/L (ref 3.5–5.1)
SODIUM: 139 mmol/L (ref 135–145)
Sodium: 142 mmol/L (ref 135–145)
TOTAL PROTEIN: 7.3 g/dL (ref 6.5–8.1)
Total Bilirubin: 0.7 mg/dL (ref 0.3–1.2)
Total Protein: 7.1 g/dL (ref 6.5–8.1)

## 2016-01-28 LAB — RAPID URINE DRUG SCREEN, HOSP PERFORMED
Amphetamines: NOT DETECTED
BARBITURATES: NOT DETECTED
Benzodiazepines: NOT DETECTED
Cocaine: NOT DETECTED
Opiates: NOT DETECTED
Tetrahydrocannabinol: NOT DETECTED

## 2016-01-28 LAB — LIPASE, BLOOD: Lipase: 72 U/L — ABNORMAL HIGH (ref 11–51)

## 2016-01-28 LAB — ETHANOL

## 2016-01-28 LAB — ACETAMINOPHEN LEVEL

## 2016-01-28 LAB — SALICYLATE LEVEL

## 2016-01-28 MED ORDER — DEXTROSE 10 % IV SOLN
INTRAVENOUS | Status: DC
  Administered 2016-01-28 – 2016-01-29 (×2): via INTRAVENOUS

## 2016-01-28 MED ORDER — ONDANSETRON HCL 4 MG/2ML IJ SOLN: 4.0000 mg | Freq: Four times a day (QID) | INTRAMUSCULAR | Status: DC | PRN

## 2016-01-28 MED ORDER — ACETAMINOPHEN 650 MG RE SUPP: 650.0000 mg | Freq: Four times a day (QID) | RECTAL | Status: DC | PRN

## 2016-01-28 MED ORDER — GLUCAGON HCL RDNA (DIAGNOSTIC) 1 MG IJ SOLR
INTRAMUSCULAR | Status: AC
  Filled 2016-01-28: qty 1

## 2016-01-28 MED ORDER — ALBUTEROL SULFATE (2.5 MG/3ML) 0.083% IN NEBU: 2.5000 mg | INHALATION_SOLUTION | RESPIRATORY_TRACT | Status: DC | PRN

## 2016-01-28 MED ORDER — HYDRALAZINE HCL 25 MG PO TABS
50.0000 mg | ORAL_TABLET | Freq: Once | ORAL | Status: AC
  Administered 2016-01-28: 50 mg via ORAL
  Filled 2016-01-28: qty 2

## 2016-01-28 MED ORDER — DEXTROSE 50 % IV SOLN
INTRAVENOUS | Status: AC
  Administered 2016-01-28: 50 mL
  Filled 2016-01-28: qty 50

## 2016-01-28 MED ORDER — HYDRALAZINE HCL 25 MG PO TABS
100.0000 mg | ORAL_TABLET | Freq: Every day | ORAL | Status: DC
  Administered 2016-01-29 – 2016-02-02 (×6): 100 mg via ORAL
  Filled 2016-01-28 (×6): qty 4

## 2016-01-28 MED ORDER — INSULIN ASPART 100 UNIT/ML ~~LOC~~ SOLN
0.0000 [IU] | Freq: Three times a day (TID) | SUBCUTANEOUS | Status: DC
  Administered 2016-01-29: 7 [IU] via SUBCUTANEOUS
  Filled 2016-01-28: qty 1

## 2016-01-28 MED ORDER — DEXTROSE 50 % IV SOLN
1.0000 | Freq: Once | INTRAVENOUS | Status: AC
  Administered 2016-01-28: 50 mL via INTRAVENOUS

## 2016-01-28 MED ORDER — HEPARIN SODIUM (PORCINE) 5000 UNIT/ML IJ SOLN
5000.0000 [IU] | Freq: Three times a day (TID) | INTRAMUSCULAR | Status: DC
  Administered 2016-01-29 – 2016-02-03 (×12): 5000 [IU] via SUBCUTANEOUS
  Filled 2016-01-28 (×12): qty 1

## 2016-01-28 MED ORDER — HYDRALAZINE HCL 20 MG/ML IJ SOLN: 10.0000 mg | INTRAMUSCULAR | Status: DC | PRN

## 2016-01-28 MED ORDER — ONDANSETRON HCL 4 MG PO TABS: 4.0000 mg | ORAL_TABLET | Freq: Four times a day (QID) | ORAL | Status: DC | PRN

## 2016-01-28 MED ORDER — RENA-VITE PO TABS
1.0000 | ORAL_TABLET | Freq: Every day | ORAL | Status: DC
  Administered 2016-01-29 – 2016-02-02 (×5): 1 via ORAL
  Filled 2016-01-28 (×5): qty 1

## 2016-01-28 MED ORDER — ACETAMINOPHEN 325 MG PO TABS: 650.0000 mg | ORAL_TABLET | Freq: Four times a day (QID) | ORAL | Status: DC | PRN

## 2016-01-28 MED ORDER — CALCIUM ACETATE (PHOS BINDER) 667 MG PO CAPS: 1334.0000 mg | ORAL_CAPSULE | ORAL | Status: DC

## 2016-01-28 MED ORDER — GLUCAGON HCL RDNA (DIAGNOSTIC) 1 MG IJ SOLR
1.0000 mg | Freq: Once | INTRAMUSCULAR | Status: AC | PRN
  Administered 2016-01-28: 1 mg via INTRAVENOUS

## 2016-01-28 NOTE — ED Notes (Signed)
CBG checked. Resulted as 39. D10 increased to 100. Glucagon given. MD at bedside
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
## 2016-01-28 NOTE — ED Notes (Signed)
Pt diaphoretic and difficult to arouse. CBG checked and was a critical low of 10. IV quickly started in the foot. 1 Amp of D50 given.

## 2016-01-28 NOTE — ED Notes (Signed)
CBG 143  

## 2016-01-28 NOTE — ED Notes (Signed)
Patient transported to CT 

## 2016-01-28 NOTE — ED Notes (Signed)
IV team at bedside states they attempted PIV without success even using bedside ultrasound; 20g R foot remains functional

## 2016-01-28 NOTE — ED Notes (Signed)
Admitting physician at bedside

## 2016-01-28 NOTE — ED Notes (Signed)
All belongings removed at this time- one pair jeans, 1 pair shoes, 1 pair socks, one black belt 3 shirts cut off during medical emergency (hypoglycemia) Valuables sent to security safe- wallet, cash ($21), 1 prepaid debit card, 1 [employer] credit card, 1 [employer] debit card, and 1 [place] license

## 2016-01-28 NOTE — ED Notes (Signed)
##  NOTIFY GPD IF/WHEN PATIENT IS D/C OR MOVED##

## 2016-01-28 NOTE — ED Notes (Signed)
Pt is now more responsive. Physician at bedside questioning patient if an excess of insulin was taken in attempt to harm himself. Pt stated that he was trying to harm himself. IVC paperwork being obtained now

## 2016-01-28 NOTE — H&P (Signed)
History and Physical    Edward Colon UPJ:[id number] DOB: [birth year] DOA: 01/28/2016  Referring MD/NP/PA: Dr. Laneta Simmers PCP: [name], MD  Patient coming from: Home  Chief Complaint: Suicide attempt  HPI: Edward Colon is a 35 y.o. male with medical history significant of ESRD on HD, type 1 diabetes, HTN, and anemia; who presents after being picked up by [facility] Department after patient had been attempting to harm himself with scissors. Patient was noted to be acting out and was reportedly tased. Upon arrival to the emergency department patient was noted to have low blood sugars in the 40s for which patient received 2 A of D50 and subsequently had to be placed on a D10 drip. Patient did come to and noted that he often misses his hemodialysis appointments because it is not accommodating to his work schedule. He also admits to taking approximately 40 units of NPH and some unknown amount of regular insulin as well prior to being transported to the hospital tonight with the intent to harm himself. He complains of generalized achiness all over, leg swelling, and mild shortness of breath at this time. He denies having any chest pain, dysuria, fever, or seizure-like activity. He reports that his last dialysis session may have been approximately 2 weeks ago and he still makes a little bit of urine.   ED Course: Upon arrival to the emergency department patient was IVC. Lab work revealed no hyperkalemia, creatinine 9.44, BUN 68. Patient O2 sats saturations were maintained on room air. Due to the need of frequent glucose checks patient was recommended to be sent to stepdown.  Review of Systems: As per HPI otherwise 10 point review of systems negative.   Past Medical History:  Diagnosis Date  . Anemia   . Blind one eye    left eye  . Chronic kidney disease   . Diabetes mellitus without complication ([place])   . Heart murmur    denies any problems with it  . Hypertension   .  Neuropathy ([place])   . Seizures ([place])    none for 2 months as of 03/31/15  . Type 1 diabetes [facility])     Past Surgical History:  Procedure Laterality Date  . AV FISTULA PLACEMENT Right 03/03/2015   Procedure: RIGHT RADIO-CEPHALIC ARTERIOVENOUS (AV) FISTULA CREATION;  Surgeon: Serafina Mitchell, MD;  Location: [facility] OR;  Service: Vascular;  Laterality: Right;  . AV FISTULA PLACEMENT Left 06/15/2015   Procedure: INSERTION OF LEFT UPPER ARM  ARTERIOVENOUS (AV) 31mm x 50cm GORE-TEX GRAFT;  Surgeon: Elam Dutch, MD;  Location: [place];  Service: Vascular;  Laterality: Left;  . BASCILIC VEIN TRANSPOSITION Left 04/01/2015   Procedure: BASCILIC VEIN TRANSPOSITION-LEFT ARM- FIRST STAGE;  Surgeon: Elam Dutch, MD;  Location: [place];  Service: Vascular;  Laterality: Left;  . EYE SURGERY Right    for diabetic retinopathy  . INSERTION OF DIALYSIS CATHETER Right 03/03/2015   Procedure: INSERTION OF DIALYSIS CATHETER;  Surgeon: Serafina Mitchell, MD;  Location: [place];  Service: Vascular;  Laterality: Right;     reports that he has never smoked. He has never used smokeless tobacco. He reports that he drinks alcohol. He reports that he does not use drugs.  No Known Allergies  Family History  Problem Relation Age of Onset  . Diabetes Neg Hx     Prior to Admission medications   Medication Sig Start Date End Date Taking? Authorizing Provider  calcium acetate (PHOSLO) 667 MG capsule Take  1 capsule (667 mg total) by mouth 3 (three) times daily with meals. Patient taking differently: Take 1,334-2,668 mg by mouth See admin instructions. Take 4 capsules (2668 mg) by mouth with meals and 2 capsules (1334 mg) with snacks 03/07/15  Yes [name], MD  hydrALAZINE (APRESOLINE) 100 MG tablet Take 100 mg by mouth at bedtime.  09/22/15  Yes Historical Provider, MD  insulin NPH Human (HUMULIN N) 100 UNIT/ML injection Inject 0.4 mLs (40 Units total) into the skin every morning. Patient taking differently: Inject 40  Units into the skin daily before breakfast.  01/08/16  Yes Renato Shin, MD  insulin regular (NOVOLIN R,HUMULIN R) 250 units/2.49mL (100 units/mL) injection Inject 2-15 Units into the skin See admin instructions. Inject 2-15 units subcutaneously up to 6 times daily when CBG meter reads "high"   Yes Historical Provider, MD  multivitamin (RENA-VIT) TABS tablet Take 1 tablet by mouth at bedtime. Patient taking differently: Take 1 tablet by mouth daily.  03/07/15  Yes [name], MD  glucose blood (ONETOUCH VERIO) test strip 1 each by Other route 4 (four) times daily. And lancets 4/day DX CODE E.11.10 01/12/16   Renato Shin, MD  insulin lispro (HUMALOG) 100 UNIT/ML injection Inject 0.02 mLs (2 Units total) into the skin 3 (three) times daily with meals. Uses sliding scale Patient not taking: Reported on 01/28/2016 01/08/16   Renato Shin, MD  [facility] DELICA LANCETS 16S MISC Use check blood sugar 4 times per day. Dx code: E11.9 06/10/15   Renato Shin, MD    Physical Exam:   Constitutional: Lethargic male who is arousable with repeated stimuli and is able to follow commands. Vitals:   01/28/16 2200 01/28/16 2215 01/28/16 2230 01/28/16 2245  BP: 195/96 (!) 211/92 189/88 187/91  Pulse: 80 87 76 74  Resp: 18 25 15 14   SpO2: 99% 93% 98% 99%   Eyes: PERRL, lids and conjunctivae normal ENMT: Mucous membranes are dry. Posterior pharynx clear of any exudate or lesions. Normal dentition.  Neck: normal, supple, no masses, no thyromegaly Respiratory: Decreased overall air movement with crackles appreciated. No accessory muscle use.  Cardiovascular: Regular rate and rhythm, no murmurs / rubs / gallops. +2 pitting lower  extremity edema. 2+ pedal pulses. No carotid bruits. Fistula in left upper extremity  Abdomen: no tenderness, no masses palpated. No hepatosplenomegaly. Bowel sounds positive.  Musculoskeletal: no clubbing / cyanosis. No joint deformity upper and lower extremities. Good ROM, no  contractures. Normal muscle tone.  Skin: no rashes, lesions, ulcers. No induration Neurologic: CN 2-12 grossly intact. Sensation abnormal, DTR normal. Strength 5/5 in all 4.  Psychiatric: poor judgment and insight. Lethargic, but oriented x 3. Depressed mood    Labs on Admission: I have personally reviewed following labs and imaging studies  CBC:  Recent Labs Lab 01/27/16 2343 01/28/16 2113  WBC 5.4 6.7  NEUTROABS  --  5.5  HGB 10.0* 10.5*  HCT 31.1* 32.0*  MCV 90.7 90.1  PLT 248 063   Basic Metabolic Panel:  Recent Labs Lab 01/27/16 2343 01/28/16 2113  NA 139 142  K 4.2 4.2  CL 107 111  CO2 18* 19*  GLUCOSE 117* 46*  BUN 71* 68*  CREATININE 9.49* 9.44*  CALCIUM 8.3* 8.5*   GFR: Estimated Creatinine Clearance: 11.9 mL/min (by C-G formula based on SCr of 9.44 mg/dL (H)). Liver Function Tests:  Recent Labs Lab 01/27/16 2343 01/28/16 2113  AST 71* 41  ALT 69* 56  ALKPHOS 84 81  BILITOT 0.7  0.7  PROT 7.1 7.3  ALBUMIN 3.6 3.6    Recent Labs Lab 01/27/16 2343  LIPASE 72*   No results for input(s): AMMONIA in the last 168 hours. Coagulation Profile: No results for input(s): INR, PROTIME in the last 168 hours. Cardiac Enzymes: No results for input(s): CKTOTAL, CKMB, CKMBINDEX, TROPONINI in the last 168 hours. BNP (last 3 results) No results for input(s): PROBNP in the last 8760 hours. HbA1C: No results for input(s): HGBA1C in the last 72 hours. CBG:  Recent Labs Lab 01/28/16 1922 01/28/16 1943 01/28/16 2108 01/28/16 2138 01/28/16 2237  GLUCAP <10* 84 39* 143* 155*   Lipid Profile: No results for input(s): CHOL, HDL, LDLCALC, TRIG, CHOLHDL, LDLDIRECT in the last 72 hours. Thyroid Function Tests: No results for input(s): TSH, T4TOTAL, FREET4, T3FREE, THYROIDAB in the last 72 hours. Anemia Panel: No results for input(s): VITAMINB12, FOLATE, FERRITIN, TIBC, IRON, RETICCTPCT in the last 72 hours. Urine analysis:    Component Value Date/Time    COLORURINE YELLOW 02/27/2015 0212   APPEARANCEUR CLEAR 02/27/2015 0212   LABSPEC 1.012 02/27/2015 0212   PHURINE 6.5 02/27/2015 0212   GLUCOSEU 250 (A) 02/27/2015 0212   HGBUR MODERATE (A) 02/27/2015 0212   BILIRUBINUR NEGATIVE 02/27/2015 0212   KETONESUR NEGATIVE 02/27/2015 0212   PROTEINUR >300 (A) 02/27/2015 0212   UROBILINOGEN 0.2 10/26/2013 2324   NITRITE NEGATIVE 02/27/2015 0212   LEUKOCYTESUR NEGATIVE 02/27/2015 0212   Sepsis Labs: No results found for this or any previous visit (from the past 240 hour(s)).   Radiological Exams on Admission: Dg Chest 2 View  Result Date: 01/28/2016 CLINICAL DATA:  Chronic cough and congestion. Shortness of breath. Initial encounter. EXAM: CHEST  2 VIEW COMPARISON:  Chest radiograph performed 03/03/2015 FINDINGS: The lungs are well-aerated. Vascular congestion is noted. Mildly increased interstitial markings may reflect minimal interstitial edema. There is no evidence of pleural effusion or pneumothorax. The heart is borderline normal in size. No acute osseous abnormalities are seen. IMPRESSION: Vascular congestion. Mildly increased interstitial markings may reflect minimal interstitial edema. Electronically Signed   By: Garald Balding M.D.   On: 01/28/2016 01:26   Ct Head Wo Contrast  Result Date: 01/28/2016 CLINICAL DATA:  Acute onset of altered mental status. Patient not responding to painful stimuli. Initial encounter. EXAM: CT HEAD WITHOUT CONTRAST TECHNIQUE: Contiguous axial images were obtained from the base of the skull through the vertex without intravenous contrast. COMPARISON:  CT of the head performed 05/31/2013, and MRI of the brain performed 06/03/2013 FINDINGS: Brain: No evidence of acute infarction, hemorrhage, hydrocephalus, extra-axial collection or mass lesion/mass effect. The posterior fossa, including the cerebellum, brainstem and fourth ventricle, is within normal limits. The third and lateral ventricles, and basal ganglia are  unremarkable in appearance. The cerebral hemispheres are symmetric in appearance, with normal gray-white differentiation. No mass effect or midline shift is seen. Vascular: No hyperdense vessel or unexpected calcification. Skull: There is no evidence of fracture; visualized osseous structures are unremarkable in appearance. Sinuses/Orbits: Prominent calcification is noted at the left optic globe, with abnormal increased attenuation within the left optic globe. The right orbit is grossly unremarkable. The paranasal sinuses and mastoid air cells are well-aerated. Other: No significant soft tissue abnormalities are seen. IMPRESSION: 1. No acute intracranial pathology seen on CT. 2. Prominent calcification at the left optic globe, with abnormal increased attenuation within the left optic globe. Electronically Signed   By: Garald Balding M.D.   On: 01/28/2016 20:17   Dg Chest Port 1  View  Result Date: 01/28/2016 CLINICAL DATA:  Patient was attempting to harm himself with scissors. Patient was tazed by the police department. Wound to the mid chest from tazer. Patient is now lethargic. EXAM: PORTABLE CHEST 1 VIEW COMPARISON:  01/28/2016 FINDINGS: Shallow inspiration. Normal heart size and pulmonary vascularity. No focal airspace disease or consolidation in the lungs. No blunting of costophrenic angles. No pneumothorax. Mediastinal contours appear intact. Soft tissue attenuation over the lung bases. Mediastinal contours appear intact. No radiopaque soft tissue foreign bodies identified. IMPRESSION: No active disease. Electronically Signed   By: Lucienne Capers M.D.   On: 01/28/2016 23:01    EKG: Independently reviewed. Normal sinus rhythm 81 bpm  Assessment/Plan Suicide attempt by overdose/involuntary commitment: Patient attempted to commit harm to himself with scissors and by overdosing on insulin. - Admit Stepdown - Patient IVC by ED physician - Sitter to bedside - Patient will need psychiatric evaluation  once medically stable  Hypoglycemia, Type I diabetic:  Patient reportedly took 40 units NPH insulin with unknown amount of regular insulin with the intent to harm himself. Patient given amps of D50 and then subsequently put on a rate of D10 IVFs. - Hypoglycemic protocols - Continue dextrose 10% infusion at 75 mL per hour, until blood sugars to maintain >180 - CBGs every before meals with sepsis sliding-scale insulin  Essential hypertension, uncontrolled. Blood pressure is noted as high as 211/92 - Continue home medication of oral hydralazine - Hydralazine IV prn  Shortness of breath  - Continuous pulse oximetry with nasal cannula oxygen and keep O2 sats greater than 92%  ESRD on HD:  - Continue PhosLo, Rena-Vit - Left message on hemodialysis was no one  Anemia of chronic disease:  Hemoglobin stable at 10. - Continue to monitor  DVT prophylaxis: heparin  Code Status: Full  Family Communication: No family present at bedside  Disposition Plan:  TBD Consults called:  None  Admission status: Observation   Norval Morton MD [facility] Pager [phone]  If 7PM-7AM, please contact night-coverage [email or web address] Password [facility]  01/28/2016, 11:07 PM

## 2016-01-28 NOTE — ED Notes (Signed)
Pt states that he has not had dialysis for over a week.

## 2016-01-28 NOTE — ED Notes (Signed)
ED Provider at bedside. 

## 2016-01-28 NOTE — ED Notes (Signed)
Patient transported to CT via stretcher with transporter

## 2016-01-28 NOTE — ED Provider Notes (Signed)
[place] DEPT Provider Note   CSN: [id number] Arrival date & time: 01/28/16  1818     History   Chief Complaint Chief Complaint  Patient presents with  . Medical Clearance    HPI Edward Colon is a 35 y.o. male.  The history is provided by the patient and the police.  Mental Health Problem  Presenting symptoms: agitation, bizarre behavior, suicidal threats and suicide attempt   Patient accompanied by:  Law enforcement Degree of incapacity (severity):  Severe Onset quality:  Sudden Timing:  Constant Progression:  Unchanged Chronicity:  New Context: medication and stressful life event (working a lot and having financial trouble)   Relieved by:  Nothing Worsened by:  Nothing Associated symptoms: poor judgment     Past Medical History:  Diagnosis Date  . Anemia   . Blind one eye    left eye  . Chronic kidney disease   . Diabetes mellitus without complication ([place])   . Heart murmur    denies any problems with it  . Hypertension   . Neuropathy ([place])   . Seizures ([place])    none for 2 months as of 03/31/15  . Type 1 diabetes [facility])     Patient Active Problem List   Diagnosis Date Noted  . ESRD on dialysis ([place]) 03/07/2015  . Hyperglycemia 03/06/2015  . Acute on chronic renal failure ([place]) 02/27/2015  . URI (upper respiratory infection) 02/27/2015  . Acute-on-chronic kidney injury ([place]) 02/27/2015  . Rhabdomyolysis 08/31/2014  . Hypoglycemia 08/30/2014  . Type 1 diabetes ([place]) 08/30/2014  . CKD stage 2 due to type 2 diabetes mellitus ([place]) 08/30/2014  . Anemia 08/30/2014  . Swelling of arm 05/31/2013  . ARF (acute renal failure) ([place]) 05/31/2013  . Hyperkalemia 05/31/2013  . Seizure ([place]) 05/31/2013  . Seizures ([place])   . Hypertension     Past Surgical History:  Procedure Laterality Date  . AV FISTULA PLACEMENT Right 03/03/2015   Procedure: RIGHT RADIO-CEPHALIC ARTERIOVENOUS (AV) FISTULA CREATION;  Surgeon: Serafina Mitchell, MD;  [facility];  Service: Vascular;  Laterality: Right;  . AV FISTULA PLACEMENT Left 06/15/2015   Procedure: INSERTION OF LEFT UPPER ARM  ARTERIOVENOUS (AV) 47mm x 50cm GORE-TEX GRAFT;  Surgeon: Elam Dutch, MD;  Location: [place];  Service: Vascular;  Laterality: Left;  . BASCILIC VEIN TRANSPOSITION Left 04/01/2015   Procedure: BASCILIC VEIN TRANSPOSITION-LEFT ARM- FIRST STAGE;  Surgeon: Elam Dutch, MD;  Location: [place];  Service: Vascular;  Laterality: Left;  . EYE SURGERY Right    for diabetic retinopathy  . INSERTION OF DIALYSIS CATHETER Right 03/03/2015   Procedure: INSERTION OF DIALYSIS CATHETER;  Surgeon: Serafina Mitchell, MD;  [facility];  Service: Vascular;  Laterality: Right;       Home Medications    Prior to Admission medications   Medication Sig Start Date End Date Taking? Authorizing Provider  calcium acetate (PHOSLO) 667 MG capsule Take 1 capsule (667 mg total) by mouth 3 (three) times daily with meals. Patient taking differently: Take 1,334-2,668 mg by mouth See admin instructions. Take 4 capsules (2668 mg) by mouth with meals and 2 capsules (1334 mg) with snacks 03/07/15  Yes [name], MD  hydrALAZINE (APRESOLINE) 100 MG tablet Take 100 mg by mouth at bedtime.  09/22/15  Yes Historical Provider, MD  insulin NPH Human (HUMULIN N) 100 UNIT/ML injection Inject 0.4 mLs (40 Units total) into the skin every morning. Patient taking differently: Inject 40 Units into the  skin daily before breakfast.  01/08/16  Yes Renato Shin, MD  insulin regular (NOVOLIN R,HUMULIN R) 250 units/2.64mL (100 units/mL) injection Inject 2-15 Units into the skin See admin instructions. Inject 2-15 units subcutaneously up to 6 times daily when CBG meter reads "high"   Yes Historical Provider, MD  multivitamin (RENA-VIT) TABS tablet Take 1 tablet by mouth at bedtime. Patient taking differently: Take 1 tablet by mouth daily.  03/07/15  Yes [name], MD  glucose blood (ONETOUCH VERIO) test strip 1  each by Other route 4 (four) times daily. And lancets 4/day DX CODE E.11.10 01/12/16   Renato Shin, MD  insulin lispro (HUMALOG) 100 UNIT/ML injection Inject 0.02 mLs (2 Units total) into the skin 3 (three) times daily with meals. Uses sliding scale Patient not taking: Reported on 01/28/2016 01/08/16   Renato Shin, MD  [facility] DELICA LANCETS 09B MISC Use check blood sugar 4 times per day. Dx code: E11.9 06/10/15   Renato Shin, MD    Family History Family History  Problem Relation Age of Onset  . Diabetes Neg Hx     Social History Social History  Substance Use Topics  . Smoking status: Never Smoker  . Smokeless tobacco: Never Used  . Alcohol use Yes     Comment: rare     Allergies   Patient has no known allergies.   Review of Systems Review of Systems  Psychiatric/Behavioral: Positive for agitation.  All other systems reviewed and are negative.    Physical Exam Updated Vital Signs BP 187/91   Pulse 74   Resp 14   SpO2 99%   Physical Exam  Constitutional: He appears well-developed and well-nourished. He appears lethargic. No distress.  HENT:  Head: Normocephalic and atraumatic.  Nose: Nose normal.  Eyes: Conjunctivae are normal.  Neck: Neck supple. No tracheal deviation present.  Cardiovascular: Normal rate and regular rhythm.   Pulmonary/Chest: Effort normal. No respiratory distress.  Abdominal: Soft. He exhibits no distension.  Neurological: He has normal strength. He appears lethargic. He is disoriented. No cranial nerve deficit. GCS eye subscore is 2. GCS verbal subscore is 4. GCS motor subscore is 4.  Skin: Skin is warm. He is diaphoretic.  Psychiatric: His affect is blunt. He is slowed.     ED Treatments / Results  Labs (all labs ordered are listed, but only abnormal results are displayed) Labs Reviewed  COMPREHENSIVE METABOLIC PANEL - Abnormal; Notable for the following:       Result Value   CO2 19 (*)    Glucose, Bld 46 (*)    BUN 68 (*)     Creatinine, Ser 9.44 (*)    Calcium 8.5 (*)    GFR calc non Af Amer 6 (*)    GFR calc Af Amer 7 (*)    All other components within normal limits  CBC WITH DIFFERENTIAL/PLATELET - Abnormal; Notable for the following:    RBC 3.55 (*)    Hemoglobin 10.5 (*)    HCT 32.0 (*)    All other components within normal limits  ACETAMINOPHEN LEVEL - Abnormal; Notable for the following:    Acetaminophen (Tylenol), Serum <10 (*)    All other components within normal limits  CBG MONITORING, ED - Abnormal; Notable for the following:    Glucose-Capillary <10 (*)    All other components within normal limits  CBG MONITORING, ED - Abnormal; Notable for the following:    Glucose-Capillary 39 (*)    All other components within normal limits  CBG MONITORING, ED - Abnormal; Notable for the following:    Glucose-Capillary 143 (*)    All other components within normal limits  CBG MONITORING, ED - Abnormal; Notable for the following:    Glucose-Capillary 155 (*)    All other components within normal limits  ETHANOL  RAPID URINE DRUG SCREEN, HOSP PERFORMED  SALICYLATE LEVEL  CBG MONITORING, ED    EKG  EKG Interpretation None       Radiology Dg Chest 2 View  Result Date: 01/28/2016 CLINICAL DATA:  Chronic cough and congestion. Shortness of breath. Initial encounter. EXAM: CHEST  2 VIEW COMPARISON:  Chest radiograph performed 03/03/2015 FINDINGS: The lungs are well-aerated. Vascular congestion is noted. Mildly increased interstitial markings may reflect minimal interstitial edema. There is no evidence of pleural effusion or pneumothorax. The heart is borderline normal in size. No acute osseous abnormalities are seen. IMPRESSION: Vascular congestion. Mildly increased interstitial markings may reflect minimal interstitial edema. Electronically Signed   By: Garald Balding M.D.   On: 01/28/2016 01:26   Ct Head Wo Contrast  Result Date: 01/28/2016 CLINICAL DATA:  Acute onset of altered mental status.  Patient not responding to painful stimuli. Initial encounter. EXAM: CT HEAD WITHOUT CONTRAST TECHNIQUE: Contiguous axial images were obtained from the base of the skull through the vertex without intravenous contrast. COMPARISON:  CT of the head performed 05/31/2013, and MRI of the brain performed 06/03/2013 FINDINGS: Brain: No evidence of acute infarction, hemorrhage, hydrocephalus, extra-axial collection or mass lesion/mass effect. The posterior fossa, including the cerebellum, brainstem and fourth ventricle, is within normal limits. The third and lateral ventricles, and basal ganglia are unremarkable in appearance. The cerebral hemispheres are symmetric in appearance, with normal gray-white differentiation. No mass effect or midline shift is seen. Vascular: No hyperdense vessel or unexpected calcification. Skull: There is no evidence of fracture; visualized osseous structures are unremarkable in appearance. Sinuses/Orbits: Prominent calcification is noted at the left optic globe, with abnormal increased attenuation within the left optic globe. The right orbit is grossly unremarkable. The paranasal sinuses and mastoid air cells are well-aerated. Other: No significant soft tissue abnormalities are seen. IMPRESSION: 1. No acute intracranial pathology seen on CT. 2. Prominent calcification at the left optic globe, with abnormal increased attenuation within the left optic globe. Electronically Signed   By: Garald Balding M.D.   On: 01/28/2016 20:17   Dg Chest Port 1 View  Result Date: 01/28/2016 CLINICAL DATA:  Patient was attempting to harm himself with scissors. Patient was tazed by the police department. Wound to the mid chest from tazer. Patient is now lethargic. EXAM: PORTABLE CHEST 1 VIEW COMPARISON:  01/28/2016 FINDINGS: Shallow inspiration. Normal heart size and pulmonary vascularity. No focal airspace disease or consolidation in the lungs. No blunting of costophrenic angles. No pneumothorax. Mediastinal  contours appear intact. Soft tissue attenuation over the lung bases. Mediastinal contours appear intact. No radiopaque soft tissue foreign bodies identified. IMPRESSION: No active disease. Electronically Signed   By: Lucienne Capers M.D.   On: 01/28/2016 23:01    Procedures Procedures (including critical care time)  CRITICAL CARE Performed by: Leo Grosser Total critical care time: 30 minutes Critical care time was exclusive of separately billable procedures and treating other patients. Critical care was necessary to treat or prevent imminent or life-threatening deterioration. Critical care was time spent personally by me on the following activities: development of treatment plan with patient and/or surrogate as well as nursing, discussions with consultants, evaluation of patient's response to treatment,  examination of patient, obtaining history from patient or surrogate, ordering and performing treatments and interventions, ordering and review of laboratory studies, ordering and review of radiographic studies, pulse oximetry and re-evaluation of patient's condition.  Medications Ordered in ED Medications  glucagon (human recombinant) (GLUCAGEN) 1 MG injection (not administered)  dextrose 10 % infusion ( Intravenous Rate/Dose Change 01/28/16 2230)  dextrose 50 % solution (50 mLs  Given 01/28/16 1929)  glucagon (human recombinant) (GLUCAGEN) injection 1 mg (1 mg Intravenous Given 01/28/16 2113)  dextrose 50 % solution 50 mL (50 mLs Intravenous Given 01/28/16 1950)     Initial Impression / Assessment and Plan / ED Course  I have reviewed the triage vital signs and the nursing notes.  Pertinent labs & imaging results that were available during my care of the patient were reviewed by me and considered in my medical decision making (see chart for details).  Clinical Course    35 y.o. male presents with suicidal behavior prior to arrival where police were called and he was holding a pair of  scissors to his throat. He was tased and taken into custody. On arrival here he is somnolent and then obtunded and diaphoretic/difficult to arouse. Blood glucose was <10. He was given an amp of D50 and improved slightly, had recurrent hypoglycemia and was given another D50 and glucagon, placed on d10 infusion at 50 ml/hr and dropped again, titrating up to 100 to maintain blood glucose. After being confronted with the details of his presentation he admits to intentional overdose on insulin to harm himself. Placed under IVC. Unclear how much insulin he used and he is a dialysis dependent patient that will likely retain and continue to be hypoglycemic. Hospitalist was consulted for admission and will see the patient in the emergency department. Will need psychiatric assessment prior to final disposition.   Final Clinical Impressions(s) / ED Diagnoses   Final diagnoses:  Suicide attempt by substance overdose, initial encounter ([place])  Hypoglycemia due to insulin  ESRD (end stage renal disease) ([place])    New Prescriptions New Prescriptions   No medications on file     Leo Grosser, MD 01/29/16 [phone]

## 2016-01-28 NOTE — Discharge Instructions (Signed)
Follow up for dialysis as scheduled. Take your medications as prescribed. Return to the ED if you develop chest pain, shortness of breath, or any other concerns.

## 2016-01-28 NOTE — ED Provider Notes (Signed)
[place] [facility] DEPT Provider Note   CSN: [id number] Arrival date & time: 01/27/16  2320   By signing my name below, I, Delton Prairie, attest that this documentation has been prepared under the direction and in the presence of Ezequiel Essex, MD  Electronically Signed: Delton Prairie, ED Scribe. 01/28/16. 2:06 AM.   History   Chief Complaint Chief Complaint  Patient presents with  . Vascular Access Problem   The history is provided by the patient. No language interpreter was used.   HPI Comments:  Edward Colon is a 35 y.o. male, with a hx of DM type 1, who presents to the Emergency Department with an unchanged, persistent cough with clear phlegm x 2 months. He notes associated SOB and mild chest pain only with cough x 2 months. SOB is worse with exertion. Pt has missed last 4 dialysis sessions and notes his last full session was 9 days ago. Pt normally dialyses on Tuesday/Thursday/Saturday. He states he is making urine and notes he urinates "a lot" daily. 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 Pt is on insulin and takes 40 units MPH in the morning daily. Pt denies a hx of COPD, hx of asthma, change in appetite, change in fluid intake, fevers, nausea, vomiting, diarrhea any other associated symptoms or modifying factors at this time. Pt states he is followed by a nephrologist. Pt is a non-smoker. He notes his next dialysis session is on 01/29/16.    Past Medical History:  Diagnosis Date  . Anemia   . Blind one eye    left eye  . Chronic kidney disease   . Diabetes mellitus without complication ([place])   . Heart murmur    denies any problems with it  . Hypertension   . Neuropathy ([place])   . Seizures ([place])    none for 2 months as of 03/31/15  . Type 1 diabetes [facility]     Patient Active Problem List   Diagnosis Date Noted  . ESRD on dialysis ([place]) 03/07/2015  . Hyperglycemia 03/06/2015  . Acute on chronic renal  failure ([place]) 02/27/2015  . URI (upper respiratory infection) 02/27/2015  . Acute-on-chronic kidney injury ([place]) 02/27/2015  . Rhabdomyolysis 08/31/2014  . Hypoglycemia 08/30/2014  . Type 1 diabetes ([place]) 08/30/2014  . CKD stage 2 due to type 2 diabetes mellitus ([place]) 08/30/2014  . Anemia 08/30/2014  . Swelling of arm 05/31/2013  . ARF (acute renal failure) ([place]) 05/31/2013  . Hyperkalemia 05/31/2013  . Seizure ([place]) 05/31/2013  . Seizures ([place])   . Hypertension     Past Surgical History:  Procedure Laterality Date  . AV FISTULA PLACEMENT Right 03/03/2015   Procedure: RIGHT RADIO-CEPHALIC ARTERIOVENOUS (AV) FISTULA CREATION;  Surgeon: Serafina Mitchell, MD;  [facility];  Service: Vascular;  Laterality: Right;  . AV FISTULA PLACEMENT Left 06/15/2015   Procedure: INSERTION OF LEFT UPPER ARM  ARTERIOVENOUS (AV) 37mm x 50cm GORE-TEX GRAFT;  Surgeon: Elam Dutch, MD;  Location: [place];  Service: Vascular;  Laterality: Left;  . BASCILIC VEIN TRANSPOSITION Left 04/01/2015   Procedure: BASCILIC VEIN TRANSPOSITION-LEFT ARM- FIRST STAGE;  Surgeon: Elam Dutch, MD;  Location: [place];  Service: Vascular;  Laterality: Left;  . EYE SURGERY Right    for diabetic retinopathy  . INSERTION OF DIALYSIS CATHETER Right 03/03/2015   Procedure: INSERTION OF DIALYSIS CATHETER;  Surgeon: Serafina Mitchell, MD;  Location: [place];  Service: Vascular;  Laterality: Right;       Home Medications    Prior to Admission medications   Medication Sig Start Date End Date Taking? Authorizing Provider  calcium acetate (PHOSLO) 667 MG capsule Take 1 capsule (667 mg total) by mouth 3 (three) times daily with meals. Patient taking differently: Take 2,001 mg by mouth 3 (three) times daily with meals. Take 3 capsules (1334 mg - 2001 mg) with meals (depending on size of meal) and 1 capsule (667 mg) with snacks 03/07/15   [name], MD  glucose blood (ONETOUCH VERIO) test strip 1 each by Other route 4 (four)  times daily. And lancets 4/day DX CODE E.11.10 01/12/16   Renato Shin, MD  hydrALAZINE (APRESOLINE) 100 MG tablet Take 100 mg by mouth daily. 1/2 tab 09/22/15   Historical Provider, MD  insulin lispro (HUMALOG) 100 UNIT/ML injection Inject 0.02 mLs (2 Units total) into the skin 3 (three) times daily with meals. Uses sliding scale 01/08/16   Renato Shin, MD  insulin NPH Human (HUMULIN N) 100 UNIT/ML injection Inject 0.4 mLs (40 Units total) into the skin every morning. 01/08/16   Renato Shin, MD  multivitamin (RENA-VIT) TABS tablet Take 1 tablet by mouth at bedtime. Patient taking differently: Take 1 tablet by mouth daily.  03/07/15   [name], MD  [facility] DELICA LANCETS 24Q MISC Use check blood sugar 4 times per day. Dx code: E11.9 06/10/15   Renato Shin, MD    Family History Family History  Problem Relation Age of Onset  . Diabetes Neg Hx     Social History Social History  Substance Use Topics  . Smoking status: Never Smoker  . Smokeless tobacco: Never Used  . Alcohol use Yes     Comment: rare     Allergies   Patient has no known allergies.   Review of Systems Review of Systems  Constitutional: Negative for appetite change and fever.  Respiratory: Positive for cough and shortness of breath.   Cardiovascular: Positive for chest pain.  Gastrointestinal: Negative for diarrhea, nausea and vomiting.  Genitourinary: Negative for difficulty urinating.   10 systems reviewed and all are negative for acute change except as noted in the HPI.  Physical Exam Updated Vital Signs BP (!) 187/102 (BP Location: Left Arm)   Pulse 88   Temp 97.5 F (36.4 C) (Axillary)   Resp 18   Ht 5\' 11"  (1.803 m)   Wt 198 lb (89.8 kg)   SpO2 99%   BMI 27.62 kg/m   Physical Exam  Constitutional: He is oriented to person, place, and time. He appears well-developed and well-nourished. No distress.  HENT:  Head: Normocephalic and atraumatic.  Mouth/Throat: Oropharynx is clear and moist. No  oropharyngeal exudate.  Eyes: Conjunctivae and EOM are normal. Pupils are equal, round, and reactive to light.  Neck: Normal range of motion. Neck supple.  No meningismus.  Cardiovascular: Normal rate, regular rhythm, normal heart sounds and intact distal pulses.   No murmur heard. Pulmonary/Chest: Effort normal. No respiratory distress.  Diminished breath sounds at bases  Abdominal: Soft. There is no tenderness. There is no rebound and no guarding.  Musculoskeletal: Normal range of motion. He exhibits no edema or tenderness.  No peripheral edema  LUE graft with thrill  Neurological: He is alert and oriented to person, place, and time. No cranial nerve deficit. He exhibits normal muscle tone. Coordination normal.   5/5 strength throughout. CN 2-12 intact.Equal grip strength. Speaking in full sentences   Skin: Skin  is warm.  Psychiatric: He has a normal mood and affect. His behavior is normal.  Nursing note and vitals reviewed.    ED Treatments / Results  DIAGNOSTIC STUDIES:  Oxygen Saturation is 99% on RA, normal by my interpretation.    COORDINATION OF CARE:  1:35 AM Discussed treatment plan with pt at bedside and pt agreed to plan.  Labs (all labs ordered are listed, but only abnormal results are displayed) Labs Reviewed  COMPREHENSIVE METABOLIC PANEL - Abnormal; Notable for the following:       Result Value   CO2 18 (*)    Glucose, Bld 117 (*)    BUN 71 (*)    Creatinine, Ser 9.49 (*)    Calcium 8.3 (*)    AST 71 (*)    ALT 69 (*)    GFR calc non Af Amer 6 (*)    GFR calc Af Amer 7 (*)    All other components within normal limits  CBC - Abnormal; Notable for the following:    RBC 3.43 (*)    Hemoglobin 10.0 (*)    HCT 31.1 (*)    All other components within normal limits  LIPASE, BLOOD - Abnormal; Notable for the following:    Lipase 72 (*)    All other components within normal limits    EKG  EKG Interpretation  Date/Time:  Thursday January 28 2016  01:39:05 EST Ventricular Rate:  81 PR Interval:    QRS Duration: 86 QT Interval:  385 QTC Calculation: 447 R Axis:   57 Text Interpretation:  Sinus rhythm No significant change was found Confirmed by Wyvonnia Dusky  MD, Darryle Dennie [phone]) on 01/28/2016 1:50:40 AM Also confirmed by Wyvonnia Dusky  MD, Barbara Keng [phone]), editor [place] CT, Leda Gauze [phone])  on 01/28/2016 8:22:37 AM       Radiology Dg Chest 2 View  Result Date: 01/28/2016 CLINICAL DATA:  Chronic cough and congestion. Shortness of breath. Initial encounter. EXAM: CHEST  2 VIEW COMPARISON:  Chest radiograph performed 03/03/2015 FINDINGS: The lungs are well-aerated. Vascular congestion is noted. Mildly increased interstitial markings may reflect minimal interstitial edema. There is no evidence of pleural effusion or pneumothorax. The heart is borderline normal in size. No acute osseous abnormalities are seen. IMPRESSION: Vascular congestion. Mildly increased interstitial markings may reflect minimal interstitial edema. Electronically Signed   By: Garald Balding M.D.   On: 01/28/2016 01:26    Procedures Procedures (including critical care time)  Medications Ordered in ED Medications - No data to display   Initial Impression / Assessment and Plan / ED Course  I have reviewed the triage vital signs and the nursing notes.  Pertinent labs & imaging results that were available during my care of the patient were reviewed by me and considered in my medical decision making (see chart for details).  Clinical Course   Dialysis patient who has not had dialysis for the past week. Denies chest pain. Has had cough for 2 months. States he's been too busy at work to go to dialysis. He is scheduled again for dialysis in 2 days.  Patient has normal potassium. No EKG changes. Mild congestion on x-ray. He is in no distress. He is able to ambulate without desaturation. Home blood pressure medications given.  No indication for emergent dialysis today. Follow up  for dialysis as scheduled. Do not miss sessions. Return precautions discussed.  Final Clinical Impressions(s) / ED Diagnoses   Final diagnoses:  ESRD (end stage renal disease) ([place])    New  Prescriptions New Prescriptions   No medications on file  I personally performed the services described in this documentation, which was scribed in my presence. The recorded information has been reviewed and is accurate.    Ezequiel Essex, MD 01/28/16 [phone]

## 2016-01-28 NOTE — ED Notes (Signed)
CBG: 39

## 2016-01-28 NOTE — ED Notes (Signed)
Patient maintain at 97-98% O2 and pulse maintain at 88

## 2016-01-29 DIAGNOSIS — Z046 Encounter for general psychiatric examination, requested by authority: Secondary | ICD-10-CM | POA: Diagnosis not present

## 2016-01-29 DIAGNOSIS — Z992 Dependence on renal dialysis: Secondary | ICD-10-CM | POA: Diagnosis not present

## 2016-01-29 DIAGNOSIS — E1022 Type 1 diabetes mellitus with diabetic chronic kidney disease: Secondary | ICD-10-CM | POA: Diagnosis not present

## 2016-01-29 DIAGNOSIS — X788XXA Intentional self-harm by other sharp object, initial encounter: Secondary | ICD-10-CM | POA: Diagnosis not present

## 2016-01-29 DIAGNOSIS — E1129 Type 2 diabetes mellitus with other diabetic kidney complication: Secondary | ICD-10-CM | POA: Diagnosis not present

## 2016-01-29 DIAGNOSIS — E1065 Type 1 diabetes mellitus with hyperglycemia: Secondary | ICD-10-CM | POA: Diagnosis not present

## 2016-01-29 DIAGNOSIS — E162 Hypoglycemia, unspecified: Secondary | ICD-10-CM | POA: Diagnosis not present

## 2016-01-29 DIAGNOSIS — E1042 Type 1 diabetes mellitus with diabetic polyneuropathy: Secondary | ICD-10-CM | POA: Diagnosis present

## 2016-01-29 DIAGNOSIS — Z794 Long term (current) use of insulin: Secondary | ICD-10-CM | POA: Diagnosis not present

## 2016-01-29 DIAGNOSIS — Z23 Encounter for immunization: Secondary | ICD-10-CM | POA: Diagnosis not present

## 2016-01-29 DIAGNOSIS — E8770 Fluid overload, unspecified: Secondary | ICD-10-CM | POA: Diagnosis present

## 2016-01-29 DIAGNOSIS — N186 End stage renal disease: Secondary | ICD-10-CM | POA: Diagnosis not present

## 2016-01-29 DIAGNOSIS — E10649 Type 1 diabetes mellitus with hypoglycemia without coma: Secondary | ICD-10-CM | POA: Diagnosis present

## 2016-01-29 DIAGNOSIS — D638 Anemia in other chronic diseases classified elsewhere: Secondary | ICD-10-CM | POA: Diagnosis present

## 2016-01-29 DIAGNOSIS — E10319 Type 1 diabetes mellitus with unspecified diabetic retinopathy without macular edema: Secondary | ICD-10-CM | POA: Diagnosis present

## 2016-01-29 DIAGNOSIS — T6592XD Toxic effect of unspecified substance, intentional self-harm, subsequent encounter: Secondary | ICD-10-CM | POA: Diagnosis not present

## 2016-01-29 DIAGNOSIS — F329 Major depressive disorder, single episode, unspecified: Secondary | ICD-10-CM | POA: Diagnosis present

## 2016-01-29 DIAGNOSIS — T1491XA Suicide attempt, initial encounter: Secondary | ICD-10-CM | POA: Diagnosis present

## 2016-01-29 DIAGNOSIS — H5462 Unqualified visual loss, left eye, normal vision right eye: Secondary | ICD-10-CM | POA: Diagnosis present

## 2016-01-29 DIAGNOSIS — T383X2A Poisoning by insulin and oral hypoglycemic [antidiabetic] drugs, intentional self-harm, initial encounter: Secondary | ICD-10-CM | POA: Diagnosis not present

## 2016-01-29 DIAGNOSIS — F322 Major depressive disorder, single episode, severe without psychotic features: Secondary | ICD-10-CM | POA: Diagnosis not present

## 2016-01-29 DIAGNOSIS — M898X9 Other specified disorders of bone, unspecified site: Secondary | ICD-10-CM | POA: Diagnosis present

## 2016-01-29 DIAGNOSIS — Z9889 Other specified postprocedural states: Secondary | ICD-10-CM | POA: Diagnosis not present

## 2016-01-29 DIAGNOSIS — N2581 Secondary hyperparathyroidism of renal origin: Secondary | ICD-10-CM | POA: Diagnosis present

## 2016-01-29 DIAGNOSIS — Z833 Family history of diabetes mellitus: Secondary | ICD-10-CM | POA: Diagnosis not present

## 2016-01-29 DIAGNOSIS — I10 Essential (primary) hypertension: Secondary | ICD-10-CM | POA: Diagnosis not present

## 2016-01-29 DIAGNOSIS — Z9115 Patient's noncompliance with renal dialysis: Secondary | ICD-10-CM | POA: Diagnosis not present

## 2016-01-29 DIAGNOSIS — Z79899 Other long term (current) drug therapy: Secondary | ICD-10-CM | POA: Diagnosis not present

## 2016-01-29 DIAGNOSIS — I120 Hypertensive chronic kidney disease with stage 5 chronic kidney disease or end stage renal disease: Secondary | ICD-10-CM | POA: Diagnosis present

## 2016-01-29 LAB — COMPREHENSIVE METABOLIC PANEL
ALBUMIN: 3.1 g/dL — AB (ref 3.5–5.0)
ALK PHOS: 76 U/L (ref 38–126)
ALT: 45 U/L (ref 17–63)
AST: 27 U/L (ref 15–41)
Anion gap: 10 (ref 5–15)
BILIRUBIN TOTAL: 1 mg/dL (ref 0.3–1.2)
BUN: 69 mg/dL — AB (ref 6–20)
CALCIUM: 8.2 mg/dL — AB (ref 8.9–10.3)
CO2: 18 mmol/L — AB (ref 22–32)
CREATININE: 9.41 mg/dL — AB (ref 0.61–1.24)
Chloride: 110 mmol/L (ref 101–111)
GFR calc Af Amer: 8 mL/min — ABNORMAL LOW (ref >60)
GFR calc non Af Amer: 6 mL/min — ABNORMAL LOW (ref >60)
GLUCOSE: 157 mg/dL — AB (ref 65–99)
Potassium: 4.7 mmol/L (ref 3.5–5.1)
Sodium: 138 mmol/L (ref 135–145)
TOTAL PROTEIN: 6.4 g/dL — AB (ref 6.5–8.1)

## 2016-01-29 LAB — CBG MONITORING, ED
GLUCOSE-CAPILLARY: 143 mg/dL — AB (ref 65–99)
GLUCOSE-CAPILLARY: 143 mg/dL — AB (ref 65–99)
GLUCOSE-CAPILLARY: 154 mg/dL — AB (ref 65–99)
GLUCOSE-CAPILLARY: 164 mg/dL — AB (ref 65–99)
GLUCOSE-CAPILLARY: 345 mg/dL — AB (ref 65–99)
Glucose-Capillary: 144 mg/dL — ABNORMAL HIGH (ref 65–99)

## 2016-01-29 LAB — CBC
HCT: 30.7 % — ABNORMAL LOW (ref 39.0–52.0)
Hemoglobin: 10 g/dL — ABNORMAL LOW (ref 13.0–17.0)
MCH: 29.3 pg (ref 26.0–34.0)
MCHC: 32.6 g/dL (ref 30.0–36.0)
MCV: 90 fL (ref 78.0–100.0)
Platelets: 245 10*3/uL (ref 150–400)
RBC: 3.41 MIL/uL — ABNORMAL LOW (ref 4.22–5.81)
RDW: 14.1 % (ref 11.5–15.5)
WBC: 5.7 10*3/uL (ref 4.0–10.5)

## 2016-01-29 LAB — GLUCOSE, CAPILLARY
Glucose-Capillary: 360 mg/dL — ABNORMAL HIGH (ref 65–99)
Glucose-Capillary: 244 mg/dL — ABNORMAL HIGH (ref 65–99)
Glucose-Capillary: 287 mg/dL — ABNORMAL HIGH (ref 65–99)

## 2016-01-29 LAB — MRSA PCR SCREENING: MRSA by PCR: NEGATIVE

## 2016-01-29 LAB — CK: Total CK: 449 U/L — ABNORMAL HIGH (ref 49–397)

## 2016-01-29 LAB — PHOSPHORUS: Phosphorus: 3 mg/dL (ref 2.5–4.6)

## 2016-01-29 MED ORDER — HEPARIN SODIUM (PORCINE) 1000 UNIT/ML DIALYSIS
100.0000 [IU]/kg | INTRAMUSCULAR | Status: DC | PRN
  Filled 2016-01-29: qty 9

## 2016-01-29 MED ORDER — SODIUM CHLORIDE 0.9 % IV SOLN: 100.0000 mL | INTRAVENOUS | Status: DC | PRN

## 2016-01-29 MED ORDER — PENTAFLUOROPROP-TETRAFLUOROETH EX AERO: 1.0000 "application " | INHALATION_SPRAY | CUTANEOUS | Status: DC | PRN

## 2016-01-29 MED ORDER — HEPARIN SODIUM (PORCINE) 1000 UNIT/ML DIALYSIS: 1000.0000 [IU] | INTRAMUSCULAR | Status: DC | PRN

## 2016-01-29 MED ORDER — LIDOCAINE HCL (PF) 1 % IJ SOLN: 5.0000 mL | INTRAMUSCULAR | Status: DC | PRN

## 2016-01-29 MED ORDER — SODIUM CHLORIDE 0.9 % IV SOLN
62.5000 mg | INTRAVENOUS | Status: DC
  Administered 2016-01-30: 62.5 mg via INTRAVENOUS
  Filled 2016-01-29 (×2): qty 5

## 2016-01-29 MED ORDER — CALCIUM ACETATE (PHOS BINDER) 667 MG PO CAPS: 1334.0000 mg | ORAL_CAPSULE | ORAL | Status: DC | PRN

## 2016-01-29 MED ORDER — INSULIN ASPART 100 UNIT/ML ~~LOC~~ SOLN: 3.0000 [IU] | Freq: Three times a day (TID) | SUBCUTANEOUS | Status: DC

## 2016-01-29 MED ORDER — DOXERCALCIFEROL 4 MCG/2ML IV SOLN
INTRAVENOUS | Status: AC
  Filled 2016-01-29: qty 4

## 2016-01-29 MED ORDER — DOXERCALCIFEROL 4 MCG/2ML IV SOLN
8.0000 ug | INTRAVENOUS | Status: DC
  Administered 2016-01-30 – 2016-02-02 (×2): 8 ug via INTRAVENOUS
  Filled 2016-01-29 (×2): qty 4

## 2016-01-29 MED ORDER — LIDOCAINE-PRILOCAINE 2.5-2.5 % EX CREA: 1.0000 "application " | TOPICAL_CREAM | CUTANEOUS | Status: DC | PRN

## 2016-01-29 MED ORDER — LIDOCAINE-PRILOCAINE 2.5-2.5 % EX CREA
1.0000 | TOPICAL_CREAM | CUTANEOUS | Status: DC | PRN
Start: 2016-01-29 — End: 2016-01-29

## 2016-01-29 MED ORDER — ALTEPLASE 2 MG IJ SOLR: 2.0000 mg | Freq: Once | INTRAMUSCULAR | Status: DC | PRN

## 2016-01-29 MED ORDER — DARBEPOETIN ALFA 60 MCG/0.3ML IJ SOSY
PREFILLED_SYRINGE | INTRAMUSCULAR | Status: AC
  Filled 2016-01-29: qty 0.3

## 2016-01-29 MED ORDER — CALCIUM ACETATE (PHOS BINDER) 667 MG PO CAPS
2668.0000 mg | ORAL_CAPSULE | Freq: Three times a day (TID) | ORAL | Status: DC
  Administered 2016-01-31 – 2016-02-03 (×11): 2668 mg via ORAL
  Filled 2016-01-29 (×11): qty 4

## 2016-01-29 MED ORDER — DOXERCALCIFEROL 4 MCG/2ML IV SOLN
8.0000 ug | Freq: Once | INTRAVENOUS | Status: AC
  Administered 2016-01-29: 8 ug via INTRAVENOUS
  Filled 2016-01-29: qty 4

## 2016-01-29 MED ORDER — INSULIN GLARGINE 100 UNIT/ML ~~LOC~~ SOLN
20.0000 [IU] | Freq: Every day | SUBCUTANEOUS | Status: DC
  Filled 2016-01-29 (×2): qty 0.2

## 2016-01-29 MED ORDER — HEPARIN SODIUM (PORCINE) 1000 UNIT/ML DIALYSIS
100.0000 [IU]/kg | INTRAMUSCULAR | Status: DC | PRN
  Filled 2016-01-29: qty 10

## 2016-01-29 MED ORDER — DARBEPOETIN ALFA 60 MCG/0.3ML IJ SOSY
60.0000 ug | PREFILLED_SYRINGE | Freq: Once | INTRAMUSCULAR | Status: AC
  Administered 2016-01-29: 60 ug via INTRAVENOUS
  Filled 2016-01-29: qty 0.3

## 2016-01-29 NOTE — Progress Notes (Signed)
Inpatient Diabetes Program Recommendations  AACE/ADA: New Consensus Statement on Inpatient Glycemic Control (2015)  Target Ranges:  Prepandial:   less than 140 mg/dL      Peak postprandial:   less than 180 mg/dL (1-2 hours)      Critically ill patients:  140 - 180 mg/dL   Lab Results  Component Value Date   GLUCAP 345 (H) 01/29/2016   HGBA1C 9.1 01/08/2016    Review of Glycemic Control:    Diabetes history: Type 1 diabetes Outpatient Diabetes medications: NPH 40 units q AM, Novolin R-2-15 units tid with meals Current orders for Inpatient glycemic control:  Novolog sensitive tid with meals  Inpatient Diabetes Program Recommendations:    Consider restarting basal insulin while patient is in the hospital.  Consider Lantus 20 units daily while patient is in the hospital.  Also consider Novolog 3 units tid with meals.   Thanks, Adah Perl, RN, BC-ADM Inpatient Diabetes Coordinator Pager [phone] (8a-5p)

## 2016-01-29 NOTE — ED Notes (Signed)
CBG 345; RN notified

## 2016-01-29 NOTE — Progress Notes (Signed)
Patient admitted to 3s03 from Ed with Rn and sitter, pt VSS, no thoughts of suicide/homicide at this time. Will continue to monitor. Room check completed with sitter.

## 2016-01-29 NOTE — ED Notes (Signed)
Breakfast tray ordered- ty

## 2016-01-29 NOTE — ED Notes (Signed)
Pt eating breakfast 

## 2016-01-29 NOTE — Procedures (Signed)
Pt seen on HD.  Ap 120 Vp 210.  BFR 400 SBP 159.  Tolerating HD ok so far.  Plan HD again in AM

## 2016-01-29 NOTE — Progress Notes (Signed)
[place] TEAM 1 - Stepdown/ICU TEAM  Edward Colon  PIR:[id number] DOB: 04/10/1980 DOA: 01/28/2016 PCP: [name], MD    Brief Narrative:  35 y.o. male with history of ESRD on HD, DM1, HTN, and anemia who presented after being picked up by the [facility] Department after patient had been attempting to harm himself with scissors. Patient was reportedly tazed.   Upon arrival to the ED patient was noted to have blood sugars in the 40s for which 2 A of D50 were given and subsequently a D10 drip was initiated. Patient came to admitted taking 40 units of NPH and an unknown amount of regular insulin prior to being transported to the hospital with the intent to harm himself. IVC was completed in the ED.    Subjective: The patient is withdrawn and chooses not to speak to the examiner.  He does not appear uncomfortable.  There is no evidence of respiratory distress or uncontrolled pain.  He is alert and does open his eyes and looks the examiner.  Assessment & Plan:  Suicide attempt by insulin overdose and w/ scissores - IVC by ED  - sitter to bedside - needs Psychiatric evaluation once medically stable (should be able to call in AM 11/25)  Hypoglycemia due to insulin OD - Has resolved with patient now hyperglycemic - cautiously resume scheduled insulin regimen and follow CBG  Uncontrolled DM 1 - Resume insulin treatment and follow closely   Essential hypertension - Not yet controlled - follow post dialysis and determine if medication titration indicated  ESRD on HD  - Nephrology following  Filed Weights   01/29/16 1148 01/29/16 1345  Weight: 95.4 kg (210 lb 5.1 oz) 94.4 kg (208 lb 1.8 oz)    Anemia of chronic disease - Hemoglobin stable at 10  Seizure D/O  DVT prophylaxis: SQ heparin  Code Status: FULL CODE Family Communication: no family present at time of exam  Disposition Plan: will likely be stable for renal tele bed in AM if mental status and CBG  stable over night   Consultants:  Nephrology   Procedures: none  Antimicrobials:  none   Objective: Blood pressure (!) 168/83, pulse 97, temperature 98.8 F (37.1 C), temperature source Axillary, resp. rate 16, height 5\' 11"  (1.803 m), weight 95.4 kg (210 lb 5.1 oz), SpO2 99 %.  Intake/Output Summary (Last 24 hours) at 01/29/16 1344 Last data filed at 01/29/16 1155  Gross per 24 hour  Intake              360 ml  Output             1000 ml  Net             -640 ml   Filed Weights   01/29/16 1148  Weight: 95.4 kg (210 lb 5.1 oz)    Examination: General: No acute respiratory distress Lungs: Clear to auscultation bilaterally without wheezes or crackles Cardiovascular: Regular rate and rhythm without murmur gallop or rub normal S1 and S2 Abdomen: Nontender, nondistended, soft, bowel sounds positive, no rebound, no ascites, no appreciable mass Extremities: No significant cyanosis, clubbing, or edema bilateral lower extremities  CBC:  Recent Labs Lab 01/27/16 2343 01/28/16 2113 01/29/16 0544  WBC 5.4 6.7 5.7  NEUTROABS  --  5.5  --   HGB 10.0* 10.5* 10.0*  HCT 31.1* 32.0* 30.7*  MCV 90.7 90.1 90.0  PLT 248 254 630   Basic Metabolic Panel:  Recent Labs Lab 01/27/16 2343  01/28/16 2113 01/29/16 0544  NA 139 142 138  K 4.2 4.2 4.7  CL 107 111 110  CO2 18* 19* 18*  GLUCOSE 117* 46* 157*  BUN 71* 68* 69*  CREATININE 9.49* 9.44* 9.41*  CALCIUM 8.3* 8.5* 8.2*   GFR: Estimated Creatinine Clearance: 13.2 mL/min (by C-G formula based on SCr of 9.41 mg/dL (H)).  Liver Function Tests:  Recent Labs Lab 01/27/16 2343 01/28/16 2113 01/29/16 0544  AST 71* 41 27  ALT 69* 56 45  ALKPHOS 84 81 76  BILITOT 0.7 0.7 1.0  PROT 7.1 7.3 6.4*  ALBUMIN 3.6 3.6 3.1*    Recent Labs Lab 01/27/16 2343  LIPASE 72*    Cardiac Enzymes:  Recent Labs Lab 01/28/16 2113  CKTOTAL 449*    HbA1C: Hemoglobin A1C  Date/Time Value Ref Range Status  01/08/2016 09:37 AM  9.1  Final  11/06/2015 04:15 PM 9.6  Final   Hgb A1c MFr Bld  Date/Time Value Ref Range Status  08/31/2014 04:42 AM 10.3 (H) 4.8 - 5.6 % Final    Comment:    (NOTE)         Pre-diabetes: 5.7 - 6.4         Diabetes: >6.4         Glycemic control for adults with diabetes: <7.0   07/18/2013 02:58 PM 7.9 (H) <5.7 % Final    Comment:    (NOTE)                                                                       According to the ADA Clinical Practice Recommendations for 2011, when HbA1c is used as a screening test:  >=6.5%   Diagnostic of Diabetes Mellitus           (if abnormal result is confirmed) 5.7-6.4%   Increased risk of developing Diabetes Mellitus References:Diagnosis and Classification of Diabetes Mellitus,Diabetes JJOA,4166,06(TKZSW 1):S62-S69 and Standards of Medical Care in         Diabetes - 2011,Diabetes FUXN,2355,73 (Suppl 1):S11-S61.    CBG:  Recent Labs Lab 01/29/16 0256 01/29/16 0420 01/29/16 0633 01/29/16 1031 01/29/16 1206  GLUCAP 143* 143* 164* 345* 360*    Recent Results (from the past 240 hour(s))  MRSA PCR Screening     Status: None   Collection Time: 01/29/16 11:00 AM  Result Value Ref Range Status   MRSA by PCR NEGATIVE NEGATIVE Final    Comment:        The GeneXpert MRSA Assay (FDA approved for NASAL specimens only), is one component of a comprehensive MRSA colonization surveillance program. It is not intended to diagnose MRSA infection nor to guide or monitor treatment for MRSA infections.      Scheduled Meds: . calcium acetate  2,668 mg Oral TID WC  . darbepoetin (ARANESP) injection - DIALYSIS  60 mcg Intravenous Once in dialysis  . doxercalciferol  8 mcg Intravenous Once in dialysis  . heparin  5,000 Units Subcutaneous Q8H  . hydrALAZINE  100 mg Oral QHS  . insulin aspart  0-9 Units Subcutaneous TID WC  . insulin aspart  3 Units Subcutaneous TID WC  . insulin glargine  20 Units Subcutaneous Daily  . multivitamin  1 tablet Oral  QHS  LOS: 0 days   Cherene Altes, MD [facility] Office  [phone] Pager - Text Page per Amion as per below:  On-Call/Text Page:      [email or web address]      password [email or web address]  If 7PM-7AM, please contact night-coverage [email or web address] Password [email or web address] 01/29/2016, 1:45 PM

## 2016-01-29 NOTE — Consult Note (Signed)
[facility] Renal Consultation Note    Indication for Consultation:  Management of ESRD/hemodialysis; anemia, hypertension/volume and secondary hyperparathyroidism PCP:  Dr. Nolene Ebbs  HPI: Edward Colon is a 35 y.o. male.with ESRD secondary to DM on HD almost a year w a history of HTN, history of seizure disorder on keppra blindness left eye and is active on the [facility] kidney/pancreas transplant list.  According to the admission note, he was brought to the ED last evening after being picked up by the [facility] Department for attempting to harm himself with scissors. He was reportedly acting out and was tased. In the ED his BS was in the 50s and was treated with 2 amps of D50.and a D10 drip.  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 He also reportedly took a large amount of NPH and also regular insulin with the intent to harm himself.  Review of the chart shows he actually came to the ED earlier on 11/23 for evaluation due to missed dialysis. At that time, K and O2 sats were safe for discharge.  When seen in the hospital dialysis unit he is very soft spoken and denies pain. His left arm is more swollen than usual and cannot attribute this to anything. He again attributes missing dialysis to work and admits to a lot of stress, some work related and he doesn't wish to talk about it now. He has no N, V, D, fever chills or significant SOB. He makes some urine.  Labs today show; K 4.7 BUN 69, Cr 9.4 alb 3.1 LFT were ^ slightly 11/22 and back to normal today.Urine tox screen is negative.  CXR is clear. Temp 98.9 BP was somewhat higher than usual pre HD BP of 180/90-100.Marland Kitchen  Post HD BP generally come down to the 417 systolic range.  Past Medical History:  Diagnosis Date  . Anemia   . Blind one eye    left eye  . Chronic kidney disease   . Diabetes mellitus without complication ([place])   . Heart murmur    denies any problems with it  .  Hypertension   . Neuropathy ([place])   . Seizures ([place])    none for 2 months as of 03/31/15  . Type 1 diabetes [facility])    Past Surgical History:  Procedure Laterality Date  . AV FISTULA PLACEMENT Right 03/03/2015   Procedure: RIGHT RADIO-CEPHALIC ARTERIOVENOUS (AV) FISTULA CREATION;  Surgeon: Serafina Mitchell, MD;  Location: [facility] OR;  Service: Vascular;  Laterality: Right;  . AV FISTULA PLACEMENT Left 06/15/2015   Procedure: INSERTION OF LEFT UPPER ARM  ARTERIOVENOUS (AV) 69mm x 50cm GORE-TEX GRAFT;  Surgeon: Elam Dutch, MD;  Location: [place];  Service: Vascular;  Laterality: Left;  . BASCILIC VEIN TRANSPOSITION Left 04/01/2015   Procedure: BASCILIC VEIN TRANSPOSITION-LEFT ARM- FIRST STAGE;  Surgeon: Elam Dutch, MD;  Location: [place];  Service: Vascular;  Laterality: Left;  . EYE SURGERY Right    for diabetic retinopathy  . INSERTION OF DIALYSIS CATHETER Right 03/03/2015   Procedure: INSERTION OF DIALYSIS CATHETER;  Surgeon: Serafina Mitchell, MD;  Location: [facility] OR;  Service: Vascular;  Laterality: Right;   Family History  Problem Relation Age of Onset  . Diabetes Neg Hx    Social History:  reports that he has never smoked. He has never used smokeless tobacco. He reports that he drinks alcohol. He reports that he does not use drugs. No Known Allergies Prior  to Admission medications   Medication Sig Start Date End Date Taking? Authorizing Provider  calcium acetate (PHOSLO) 667 MG capsule Take 1 capsule (667 mg total) by mouth 3 (three) times daily with meals. Patient taking differently: Take 1,334-2,668 mg by mouth See admin instructions. Take 4 capsules (2668 mg) by mouth with meals and 2 capsules (1334 mg) with snacks 03/07/15  Yes [name], MD  hydrALAZINE (APRESOLINE) 100 MG tablet Take 100 mg by mouth at bedtime.  09/22/15  Yes Historical Provider, MD  insulin NPH Human (HUMULIN N) 100 UNIT/ML injection Inject 0.4 mLs (40 Units total) into the skin every morning. Patient taking  differently: Inject 40 Units into the skin daily before breakfast.  01/08/16  Yes Renato Shin, MD  insulin regular (NOVOLIN R,HUMULIN R) 250 units/2.48mL (100 units/mL) injection Inject 2-15 Units into the skin See admin instructions. Inject 2-15 units subcutaneously up to 6 times daily when CBG meter reads "high"   Yes Historical Provider, MD  multivitamin (RENA-VIT) TABS tablet Take 1 tablet by mouth at bedtime. Patient taking differently: Take 1 tablet by mouth daily.  03/07/15  Yes [name], MD  glucose blood (ONETOUCH VERIO) test strip 1 each by Other route 4 (four) times daily. And lancets 4/day DX CODE E.11.10 01/12/16   Renato Shin, MD  insulin lispro (HUMALOG) 100 UNIT/ML injection Inject 0.02 mLs (2 Units total) into the skin 3 (three) times daily with meals. Uses sliding scale Patient not taking: Reported on 01/28/2016 01/08/16   Renato Shin, MD  [facility] DELICA LANCETS 15V MISC Use check blood sugar 4 times per day. Dx code: E11.9 06/10/15   Renato Shin, MD   Current Facility-Administered Medications  Medication Dose Route Frequency Provider Last Rate Last Dose  . acetaminophen (TYLENOL) tablet 650 mg  650 mg Oral Q6H PRN Norval Morton, MD       Or  . acetaminophen (TYLENOL) suppository 650 mg  650 mg Rectal Q6H PRN Norval Morton, MD      . albuterol (PROVENTIL) (2.5 MG/3ML) 0.083% nebulizer solution 2.5 mg  2.5 mg Nebulization Q2H PRN Norval Morton, MD      . calcium acetate (PHOSLO) capsule 1,334 mg  1,334 mg Oral PRN Cherene Altes, MD      . calcium acetate (PHOSLO) capsule 2,668 mg  2,668 mg Oral TID WC Cherene Altes, MD      . Darbepoetin Alfa (ARANESP) injection 60 mcg  60 mcg Intravenous Once in dialysis Alric Seton, PA-C      . doxercalciferol (HECTOROL) injection 8 mcg  8 mcg Intravenous Once in dialysis Alric Seton, PA-C      . heparin injection 5,000 Units  5,000 Units Subcutaneous Q8H Rondell A Tamala Julian, MD      . hydrALAZINE (APRESOLINE) injection 10  mg  10 mg Intravenous Q4H PRN Norval Morton, MD      . hydrALAZINE (APRESOLINE) tablet 100 mg  100 mg Oral QHS Norval Morton, MD   100 mg at 01/29/16 0605  . insulin aspart (novoLOG) injection 0-9 Units  0-9 Units Subcutaneous TID WC Norval Morton, MD   7 Units at 01/29/16 1041  . insulin aspart (novoLOG) injection 3 Units  3 Units Subcutaneous TID WC Cherene Altes, MD      . insulin glargine (LANTUS) injection 20 Units  20 Units Subcutaneous Daily Cherene Altes, MD      . multivitamin (RENA-VIT) tablet 1 tablet  1 tablet Oral QHS Rondell  Charmayne Sheer, MD      . ondansetron [facility]) tablet 4 mg  4 mg Oral Q6H PRN Norval Morton, MD       Or  . ondansetron (ZOFRAN) injection 4 mg  4 mg Intravenous Q6H PRN Norval Morton, MD       Labs: Basic Metabolic Panel:  Recent Labs Lab 01/27/16 2343 01/28/16 2113 01/29/16 0544  NA 139 142 138  K 4.2 4.2 4.7  CL 107 111 110  CO2 18* 19* 18*  GLUCOSE 117* 46* 157*  BUN 71* 68* 69*  CREATININE 9.49* 9.44* 9.41*  CALCIUM 8.3* 8.5* 8.2*   Liver Function Tests:  Recent Labs Lab 01/27/16 2343 01/28/16 2113 01/29/16 0544  AST 71* 41 27  ALT 69* 56 45  ALKPHOS 84 81 76  BILITOT 0.7 0.7 1.0  PROT 7.1 7.3 6.4*  ALBUMIN 3.6 3.6 3.1*    Recent Labs Lab 01/27/16 2343  LIPASE 72*   CBC:  Recent Labs Lab 01/27/16 2343 01/28/16 2113 01/29/16 0544  WBC 5.4 6.7 5.7  NEUTROABS  --  5.5  --   HGB 10.0* 10.5* 10.0*  HCT 31.1* 32.0* 30.7*  MCV 90.7 90.1 90.0  PLT 248 254 245   Cardiac Enzymes:  Recent Labs Lab 01/28/16 2113  CKTOTAL 449*   CBG:  Recent Labs Lab 01/29/16 0256 01/29/16 0420 01/29/16 0633 01/29/16 1031 01/29/16 1206  GLUCAP 143* 143* 164* 345* 360*   Studies/Results: Dg Chest 2 View  Result Date: 01/28/2016 CLINICAL DATA:  Chronic cough and congestion. Shortness of breath. Initial encounter. EXAM: CHEST  2 VIEW COMPARISON:  Chest radiograph performed 03/03/2015 FINDINGS: The lungs are  well-aerated. Vascular congestion is noted. Mildly increased interstitial markings may reflect minimal interstitial edema. There is no evidence of pleural effusion or pneumothorax. The heart is borderline normal in size. No acute osseous abnormalities are seen. IMPRESSION: Vascular congestion. Mildly increased interstitial markings may reflect minimal interstitial edema. Electronically Signed   By: Garald Balding M.D.   On: 01/28/2016 01:26   Ct Head Wo Contrast  Result Date: 01/28/2016 CLINICAL DATA:  Acute onset of altered mental status. Patient not responding to painful stimuli. Initial encounter. EXAM: CT HEAD WITHOUT CONTRAST TECHNIQUE: Contiguous axial images were obtained from the base of the skull through the vertex without intravenous contrast. COMPARISON:  CT of the head performed 05/31/2013, and MRI of the brain performed 06/03/2013 FINDINGS: Brain: No evidence of acute infarction, hemorrhage, hydrocephalus, extra-axial collection or mass lesion/mass effect. The posterior fossa, including the cerebellum, brainstem and fourth ventricle, is within normal limits. The third and lateral ventricles, and basal ganglia are unremarkable in appearance. The cerebral hemispheres are symmetric in appearance, with normal gray-white differentiation. No mass effect or midline shift is seen. Vascular: No hyperdense vessel or unexpected calcification. Skull: There is no evidence of fracture; visualized osseous structures are unremarkable in appearance. Sinuses/Orbits: Prominent calcification is noted at the left optic globe, with abnormal increased attenuation within the left optic globe. The right orbit is grossly unremarkable. The paranasal sinuses and mastoid air cells are well-aerated. Other: No significant soft tissue abnormalities are seen. IMPRESSION: 1. No acute intracranial pathology seen on CT. 2. Prominent calcification at the left optic globe, with abnormal increased attenuation within the left optic  globe. Electronically Signed   By: Garald Balding M.D.   On: 01/28/2016 20:17   Dg Chest Port 1 View  Result Date: 01/28/2016 CLINICAL DATA:  Patient was attempting to harm himself with scissors.  Patient was tazed by the police department. Wound to the mid chest from tazer. Patient is now lethargic. EXAM: PORTABLE CHEST 1 VIEW COMPARISON:  01/28/2016 FINDINGS: Shallow inspiration. Normal heart size and pulmonary vascularity. No focal airspace disease or consolidation in the lungs. No blunting of costophrenic angles. No pneumothorax. Mediastinal contours appear intact. Soft tissue attenuation over the lung bases. Mediastinal contours appear intact. No radiopaque soft tissue foreign bodies identified. IMPRESSION: No active disease. Electronically Signed   By: Lucienne Capers M.D.   On: 01/28/2016 23:01    ROS: As per HPI otherwise negative.  Physical Exam: Vitals:   01/29/16 1045 01/29/16 1100 01/29/16 1115 01/29/16 1148  BP:  158/78  (!) 168/83  Pulse: 98 95 97   Resp: 19 18 16 16   Temp:    98.8 F (37.1 C)  TempSrc:    Axillary  SpO2: 100% 97% 98% 99%  Weight:    95.4 kg (210 lb 5.1 oz)  Height:    5\' 11"  (1.803 m)     General: WDWN AA male NAD supine in bed Head: NCAT blind left eye MMM, face appears sl swollen Neck: Supple.  Lungs: grossly clear anteriorly dim BS breathing easily  Heart: RRR with S1 S2.  Abdomen: soft NT + BS M-S:  muscular Lower extremities: tr - 1+ LE edema or ischemic changes, no open wounds  Neuro: A & O  X 3. Moves all extremities spontaneously. Psych:  Responds to questions appropriately with reserved, quiet Dialysis Access:left upper AVGG + bruit mild/mod generalized swelling throughout arm and into hand- no evidence of infection.  Dialysis Orders: TTS AF 4.25 hours 500/800 2 K 2 Ca left upper AVGG heparin 10K with 2000 mid tmt Hectorol 8 Mircera 75 q 2 weeks, venofer 50/week Recent labs: hgb 9.8 11/16 34% sat iPTH 571   Assessment/Plan: 1. Suicide  attempt with insulin overdose/involuntary committment and hypoglycemia - per primary 2. ESRD -  TTS- off schedule - missed the last 4 HD treatments - surprised labs are not worse;  Will also plan HD tomorrow.   concerned about left arm swelling - Access flows have been in the 1100s the past 3 months - last intervention was June or July at [facility].- watch 3. Hypertension/volume  - 7 kg above EDW  Per bed weight- goal 5 L today - should be able to tolerrate this volume- outpatient hydralazine dose is 100 bid  4. Anemia  - hgb 10 - had been on Mircera - last dose was 50 on 11/7 - it was to have been increased to 75 but he hadn't been to dialysis to get it.  On weekly Fe. Will give Aranesp 60 today x 1 - 5. Metabolic bone disease -  Continue hectorol/binders - 3 phoslo AC 6. Nutrition - renal carb mod/vits 7.   DM - per primary  Myriam Jacobson, PA-C [place] [phone] 01/29/2016, 1:51 PM   I have seen and examined this patient and agree with plan per Amalia Hailey.  35yo BM who has not been very compliant with HD due to a new job? Who was brought to ER for trying to commit suicide.  He has not had HD since 01/21/16.  He is volume overloaded though labs surprisingly not too bad.  Will plan HD today and tomorrow. Yarah Fuente T,MD 01/29/2016 2:52 PM

## 2016-01-29 NOTE — ED Notes (Signed)
Attempted report 

## 2016-01-30 LAB — GLUCOSE, CAPILLARY
GLUCOSE-CAPILLARY: 32 mg/dL — AB (ref 65–99)
GLUCOSE-CAPILLARY: 95 mg/dL (ref 65–99)
GLUCOSE-CAPILLARY: 98 mg/dL (ref 65–99)
GLUCOSE-CAPILLARY: 135 mg/dL — AB (ref 65–99)
GLUCOSE-CAPILLARY: 217 mg/dL — AB (ref 65–99)
GLUCOSE-CAPILLARY: 225 mg/dL — AB (ref 65–99)
GLUCOSE-CAPILLARY: 433 mg/dL — AB (ref 65–99)
GLUCOSE-CAPILLARY: 518 mg/dL — AB (ref 65–99)
Glucose-Capillary: 111 mg/dL — ABNORMAL HIGH (ref 65–99)
Glucose-Capillary: 114 mg/dL — ABNORMAL HIGH (ref 65–99)
Glucose-Capillary: 115 mg/dL — ABNORMAL HIGH (ref 65–99)
Glucose-Capillary: 147 mg/dL — ABNORMAL HIGH (ref 65–99)
Glucose-Capillary: 172 mg/dL — ABNORMAL HIGH (ref 65–99)
Glucose-Capillary: 25 mg/dL — CL (ref 65–99)
Glucose-Capillary: 299 mg/dL — ABNORMAL HIGH (ref 65–99)
Glucose-Capillary: 526 mg/dL (ref 65–99)
Glucose-Capillary: <10 mg/dL — CL (ref 65–99)
Glucose-Capillary: >600 mg/dL (ref 65–99)
Glucose-Capillary: >600 mg/dL (ref 65–99)

## 2016-01-30 LAB — BASIC METABOLIC PANEL
Anion gap: 9 (ref 5–15)
BUN: 42 mg/dL — ABNORMAL HIGH (ref 6–20)
CHLORIDE: 95 mmol/L — AB (ref 101–111)
CO2: 23 mmol/L (ref 22–32)
CREATININE: 7.04 mg/dL — AB (ref 0.61–1.24)
Calcium: 7.3 mg/dL — ABNORMAL LOW (ref 8.9–10.3)
GFR calc non Af Amer: 9 mL/min — ABNORMAL LOW (ref >60)
GFR, EST AFRICAN AMERICAN: 11 mL/min — AB (ref >60)
GLUCOSE: 715 mg/dL — AB (ref 65–99)
Potassium: 4.1 mmol/L (ref 3.5–5.1)
Sodium: 127 mmol/L — ABNORMAL LOW (ref 135–145)

## 2016-01-30 LAB — GLUCOSE, RANDOM
GLUCOSE: 704 mg/dL — AB (ref 65–99)
Glucose, Bld: 890 mg/dL (ref 65–99)

## 2016-01-30 MED ORDER — DEXTROSE 10 % IV SOLN
INTRAVENOUS | Status: DC
  Administered 2016-01-30: 09:00:00 via INTRAVENOUS

## 2016-01-30 MED ORDER — SODIUM CHLORIDE 0.9 % IV SOLN: INTRAVENOUS | Status: DC

## 2016-01-30 MED ORDER — INSULIN ASPART 100 UNIT/ML ~~LOC~~ SOLN
10.0000 [IU] | Freq: Once | SUBCUTANEOUS | Status: AC
  Administered 2016-01-30: 10 [IU] via SUBCUTANEOUS

## 2016-01-30 MED ORDER — INSULIN ASPART 100 UNIT/ML ~~LOC~~ SOLN
15.0000 [IU] | Freq: Once | SUBCUTANEOUS | Status: AC
  Administered 2016-01-30: 15 [IU] via SUBCUTANEOUS

## 2016-01-30 MED ORDER — SODIUM CHLORIDE 0.9 % IV SOLN
INTRAVENOUS | Status: DC
  Administered 2016-01-30: 05:00:00 via INTRAVENOUS
  Filled 2016-01-30: qty 2.5

## 2016-01-30 MED ORDER — INSULIN ASPART 100 UNIT/ML ~~LOC~~ SOLN
0.0000 [IU] | SUBCUTANEOUS | Status: DC
  Administered 2016-01-30 – 2016-01-31 (×2): 9 [IU] via SUBCUTANEOUS
  Administered 2016-01-31: 7 [IU] via SUBCUTANEOUS
  Administered 2016-01-31: 5 [IU] via SUBCUTANEOUS
  Administered 2016-01-31: 12 [IU] via SUBCUTANEOUS
  Administered 2016-02-01 (×2): 7 [IU] via SUBCUTANEOUS
  Administered 2016-02-01: 9 [IU] via SUBCUTANEOUS

## 2016-01-30 MED ORDER — DEXTROSE 50 % IV SOLN
25.0000 mL | INTRAVENOUS | Status: DC | PRN
  Administered 2016-01-30 (×2): 25 mL via INTRAVENOUS
  Filled 2016-01-30 (×2): qty 50

## 2016-01-30 MED ORDER — DARBEPOETIN ALFA 60 MCG/0.3ML IJ SOSY: 60.0000 ug | PREFILLED_SYRINGE | Freq: Once | INTRAMUSCULAR | Status: DC

## 2016-01-30 MED ORDER — DOXERCALCIFEROL 4 MCG/2ML IV SOLN: 8.0000 ug | Freq: Once | INTRAVENOUS | Status: DC

## 2016-01-30 MED ORDER — DOXERCALCIFEROL 4 MCG/2ML IV SOLN
INTRAVENOUS | Status: AC
  Administered 2016-01-30: 8 ug via INTRAVENOUS
  Filled 2016-01-30: qty 4

## 2016-01-30 MED ORDER — DARBEPOETIN ALFA 60 MCG/0.3ML IJ SOSY
60.0000 ug | PREFILLED_SYRINGE | INTRAMUSCULAR | Status: DC
Start: 2016-02-04 — End: 2016-02-02

## 2016-01-30 NOTE — Progress Notes (Signed)
S: No new CO O:BP (!) 154/82 (BP Location: Right Arm)   Pulse 90   Temp 97.6 F (36.4 C) (Axillary)   Resp 13   Ht 5\' 11"  (1.803 m)   Wt 90.7 kg (199 lb 15.3 oz)   SpO2 98%   BMI 27.89 kg/m   Intake/Output Summary (Last 24 hours) at 01/30/16 0656 Last data filed at 01/29/16 2159  Gross per 24 hour  Intake              600 ml  Output             5875 ml  Net            -5275 ml   Weight change:  IOX:BDZHG and alert CVS:RRR Resp:Clear Abd:+ BS NTND Ext: 0-tr edema  LUA AVF + bruit NEURO:CNI M&SI  Ox3 no asterixis   . calcium acetate  2,668 mg Oral TID WC  . doxercalciferol  8 mcg Intravenous Q T,Th,Sa-HD  . ferric gluconate (FERRLECIT/NULECIT) IV  62.5 mg Intravenous Q Sat-HD  . heparin  5,000 Units Subcutaneous Q8H  . hydrALAZINE  100 mg Oral QHS  . insulin aspart  0-9 Units Subcutaneous TID WC  . multivitamin  1 tablet Oral QHS   Ct Head Wo Contrast  Result Date: 01/28/2016 CLINICAL DATA:  Acute onset of altered mental status. Patient not responding to painful stimuli. Initial encounter. EXAM: CT HEAD WITHOUT CONTRAST TECHNIQUE: Contiguous axial images were obtained from the base of the skull through the vertex without intravenous contrast. COMPARISON:  CT of the head performed 05/31/2013, and MRI of the brain performed 06/03/2013 FINDINGS: Brain: No evidence of acute infarction, hemorrhage, hydrocephalus, extra-axial collection or mass lesion/mass effect. The posterior fossa, including the cerebellum, brainstem and fourth ventricle, is within normal limits. The third and lateral ventricles, and basal ganglia are unremarkable in appearance. The cerebral hemispheres are symmetric in appearance, with normal gray-white differentiation. No mass effect or midline shift is seen. Vascular: No hyperdense vessel or unexpected calcification. Skull: There is no evidence of fracture; visualized osseous structures are unremarkable in appearance. Sinuses/Orbits: Prominent calcification is  noted at the left optic globe, with abnormal increased attenuation within the left optic globe. The right orbit is grossly unremarkable. The paranasal sinuses and mastoid air cells are well-aerated. Other: No significant soft tissue abnormalities are seen. IMPRESSION: 1. No acute intracranial pathology seen on CT. 2. Prominent calcification at the left optic globe, with abnormal increased attenuation within the left optic globe. Electronically Signed   By: Garald Balding M.D.   On: 01/28/2016 20:17   Dg Chest Port 1 View  Result Date: 01/28/2016 CLINICAL DATA:  Patient was attempting to harm himself with scissors. Patient was tazed by the police department. Wound to the mid chest from tazer. Patient is now lethargic. EXAM: PORTABLE CHEST 1 VIEW COMPARISON:  01/28/2016 FINDINGS: Shallow inspiration. Normal heart size and pulmonary vascularity. No focal airspace disease or consolidation in the lungs. No blunting of costophrenic angles. No pneumothorax. Mediastinal contours appear intact. Soft tissue attenuation over the lung bases. Mediastinal contours appear intact. No radiopaque soft tissue foreign bodies identified. IMPRESSION: No active disease. Electronically Signed   By: Lucienne Capers M.D.   On: 01/28/2016 23:01   BMET    Component Value Date/Time   NA 127 (L) 01/30/2016 0055   K 4.1 01/30/2016 0055   CL 95 (L) 01/30/2016 0055   CO2 23 01/30/2016 0055   GLUCOSE 715 ([place]) 01/30/2016 0055  BUN 42 (H) 01/30/2016 0055   CREATININE 7.04 (H) 01/30/2016 0055   CALCIUM 7.3 (L) 01/30/2016 0055   GFRNONAA 9 (L) 01/30/2016 0055   GFRAA 11 (L) 01/30/2016 0055   CBC    Component Value Date/Time   WBC 5.7 01/29/2016 0544   RBC 3.41 (L) 01/29/2016 0544   HGB 10.0 (L) 01/29/2016 0544   HCT 30.7 (L) 01/29/2016 0544   PLT 245 01/29/2016 0544   MCV 90.0 01/29/2016 0544   MCH 29.3 01/29/2016 0544   MCHC 32.6 01/29/2016 0544   RDW 14.1 01/29/2016 0544   LYMPHSABS 0.7 01/28/2016 2113   MONOABS 0.4  01/28/2016 2113   EOSABS 0.0 01/28/2016 2113   BASOSABS 0.0 01/28/2016 2113     Assessment: 1. Suicide attempt 2. Noncompliance with HD 3. HTN 4. Sec HPTH on hectorol 5. DM 6. Anemia on aranesp 7. ESRD  TTS [employer]  Plan: 1. Waiting for Psych to see 2. Plan HD today and get back on TTS schedule 3. See how BP responds to more volume removal   Teo Moede T

## 2016-01-30 NOTE — Progress Notes (Signed)
CRITICAL VALUE ALERT  Critical value received: Glucose 890  Date of notification: 01/30/16  Time of notification:  0045  Critical value read back:Yes  Nurse who received alert:  Rachel Bo RN  MD notified (1st page): Dr. Jonathon Bellows  Time of first page: 0050  MD notified (2nd page):  Time of second page:  Responding MD: Dr. Jonathon Bellows  Time MD responded:  [phone]  New order to give Novolog 15 units SQ now and BMET to make sure pt is not in DKA. Pt resting comfortably at this time. Insulin given. Asymptomatic. Suicide sitter at bedside. Will continue to monitor.

## 2016-01-30 NOTE — Progress Notes (Signed)
[place] TEAM 1 - Stepdown/ICU TEAM  Edward Colon  XLK:[id number] DOB: 1981-01-11 DOA: 01/28/2016 PCP: [name], MD    Brief Narrative:  35 y.o. male with history of ESRD on HD, DM1, HTN, and anemia who presented after being picked up by the [facility] Department after patient had been attempting to harm himself with scissors. Patient was reportedly tazed.   Upon arrival to the ED patient was noted to have blood sugars in the 40s for which 2 A of D50 were given and subsequently a D10 drip was initiated. Patient came to admitted taking 40 units of NPH and an unknown amount of regular insulin prior to being transported to the hospital with the intent to harm himself. IVC was completed in the ED.    Subjective: The patient is much more alert and interactive today.  He denies any current complaints but states he is very hungry.  Earlier today/last night however he encounters significant difficulty with labile blood sugars.  His CBGs initially were markedly elevated with levels over 700, and then following modest correction rapidly dropped to 20 or lower.  At this time CBGs appear to be stabilizing.  Assessment & Plan:  Suicide attempt by insulin overdose and w/ scissores - IVC by ED  - sitter to bedside - needs Psychiatric evaluation once medically stable (should be able to call in AM 11/26)  Hypoglycemia due to insulin OD - CBG very labile as noted above - appears to have stabilized for now - discontinue dextrose IV fluid and follow on very limited sliding scale  Uncontrolled DM 1 - As per discussion above   Essential hypertension - Blood pressure control markedly improved/borderline hypotensive with hemodialysis  ESRD on HD  - Nephrology following  Filed Weights   01/29/16 1810 01/30/16 0424 01/30/16 1236  Weight: 89.4 kg (197 lb 1.5 oz) 90.7 kg (199 lb 15.3 oz) 89.8 kg (197 lb 15.6 oz)    Anemia of chronic disease - Hemoglobin stable at 10  Seizure  D/O  DVT prophylaxis: SQ heparin  Code Status: FULL CODE Family Communication: no family present at time of exam  Disposition Plan: Monitor in step down bed overnight given lability of blood sugars  Consultants:  Nephrology   Procedures: none  Antimicrobials:  none   Objective: Blood pressure 121/68, pulse (!) 105, temperature 98.1 F (36.7 C), temperature source Axillary, resp. rate (!) 24, height 5\' 11"  (1.803 m), weight 89.8 kg (197 lb 15.6 oz), SpO2 100 %.  Intake/Output Summary (Last 24 hours) at 01/30/16 1603 Last data filed at 01/30/16 1000  Gross per 24 hour  Intake              240 ml  Output             5875 ml  Net            -5635 ml   Filed Weights   01/29/16 1810 01/30/16 0424 01/30/16 1236  Weight: 89.4 kg (197 lb 1.5 oz) 90.7 kg (199 lb 15.3 oz) 89.8 kg (197 lb 15.6 oz)    Examination: General: No acute respiratory distress Lungs: Clear to auscultation bilaterally - no wheeze or crackles Cardiovascular: Regular rate and rhythm without murmur  Abdomen: Nontender, nondistended, soft, bowel sounds positive, no rebound, no ascites, no appreciable mass Extremities: No edema bilateral lower extremities  CBC:  Recent Labs Lab 01/27/16 2343 01/28/16 2113 01/29/16 0544  WBC 5.4 6.7 5.7  NEUTROABS  --  5.5  --  HGB 10.0* 10.5* 10.0*  HCT 31.1* 32.0* 30.7*  MCV 90.7 90.1 90.0  PLT 248 254 062   Basic Metabolic Panel:  Recent Labs Lab 01/27/16 2343 01/28/16 2113 01/29/16 0544 01/29/16 1430 01/30/16 0014 01/30/16 0055 01/30/16 0806  NA 139 142 138  --   --  127*  --   K 4.2 4.2 4.7  --   --  4.1  --   CL 107 111 110  --   --  95*  --   CO2 18* 19* 18*  --   --  23  --   GLUCOSE 117* 46* 157*  --  890* 715* 704*  BUN 71* 68* 69*  --   --  42*  --   CREATININE 9.49* 9.44* 9.41*  --   --  7.04*  --   CALCIUM 8.3* 8.5* 8.2*  --   --  7.3*  --   PHOS  --   --   --  3.0  --   --   --    GFR: Estimated Creatinine Clearance: 15.9 mL/min (by C-G  formula based on SCr of 7.04 mg/dL (H)).  Liver Function Tests:  Recent Labs Lab 01/27/16 2343 01/28/16 2113 01/29/16 0544  AST 71* 41 27  ALT 69* 56 45  ALKPHOS 84 81 76  BILITOT 0.7 0.7 1.0  PROT 7.1 7.3 6.4*  ALBUMIN 3.6 3.6 3.1*    Recent Labs Lab 01/27/16 2343  LIPASE 72*    Cardiac Enzymes:  Recent Labs Lab 01/28/16 2113  CKTOTAL 449*    HbA1C: Hemoglobin A1C  Date/Time Value Ref Range Status  01/08/2016 09:37 AM 9.1  Final  11/06/2015 04:15 PM 9.6  Final   Hgb A1c MFr Bld  Date/Time Value Ref Range Status  08/31/2014 04:42 AM 10.3 (H) 4.8 - 5.6 % Final    Comment:    (NOTE)         Pre-diabetes: 5.7 - 6.4         Diabetes: >6.4         Glycemic control for adults with diabetes: <7.0   07/18/2013 02:58 PM 7.9 (H) <5.7 % Final    Comment:    (NOTE)                                                                       According to the ADA Clinical Practice Recommendations for 2011, when HbA1c is used as a screening test:  >=6.5%   Diagnostic of Diabetes Mellitus           (if abnormal result is confirmed) 5.7-6.4%   Increased risk of developing Diabetes Mellitus References:Diagnosis and Classification of Diabetes Mellitus,Diabetes BJSE,8315,17(OHYWV 1):S62-S69 and Standards of Medical Care in         Diabetes - 2011,Diabetes PXTG,6269,48 (Suppl 1):S11-S61.    CBG:  Recent Labs Lab 01/30/16 0942 01/30/16 1039 01/30/16 1147 01/30/16 1408 01/30/16 1524  GLUCAP 98 135* 172* 217* 225*    Recent Results (from the past 240 hour(s))  MRSA PCR Screening     Status: None   Collection Time: 01/29/16 11:00 AM  Result Value Ref Range Status   MRSA by PCR NEGATIVE NEGATIVE Final    Comment:  The GeneXpert MRSA Assay (FDA approved for NASAL specimens only), is one component of a comprehensive MRSA colonization surveillance program. It is not intended to diagnose MRSA infection nor to guide or monitor treatment for MRSA infections.        Scheduled Meds: . calcium acetate  2,668 mg Oral TID WC  . [START ON 02/04/2016] darbepoetin (ARANESP) injection - DIALYSIS  60 mcg Intravenous Q Thu-HD  . doxercalciferol      . doxercalciferol  8 mcg Intravenous Q T,Th,Sa-HD  . ferric gluconate (FERRLECIT/NULECIT) IV  62.5 mg Intravenous Q Sat-HD  . heparin  5,000 Units Subcutaneous Q8H  . hydrALAZINE  100 mg Oral QHS  . multivitamin  1 tablet Oral QHS    LOS: 1 day   Cherene Altes, MD [facility] Office  [phone] Pager - Text Page per Amion as per below:  On-Call/Text Page:      [email or web address]      password [email or web address]  If 7PM-7AM, please contact night-coverage [email or web address] Password [email or web address] 01/30/2016, 4:03 PM

## 2016-01-30 NOTE — Progress Notes (Signed)
0727- CBG <10. Pt given 1 amp of D50 and pt was able to drink cup of orange juice. STAT glucose ordered. Recheck CBG 147. Pt diaphoretic and alert and oriented. Insulin drip on HOLD. Report given to oncoming nurse. Safety sitter at bedside. Will continue to monitor.

## 2016-01-30 NOTE — Progress Notes (Signed)
CRITICAL VALUE ALERT  Critical value received:  Glucose  Date of notification:  01/30/2016  Time of notification:  0835  Critical value read back: Yes  Nurse who received alert:  Leonie Man  MD notified (1st page): Thereasa Solo, J  Time of first page:  [phone]  MD notified (2nd page):  Time of second page:  Responding MD:   Time MD responded:

## 2016-01-31 DIAGNOSIS — Z9889 Other specified postprocedural states: Secondary | ICD-10-CM

## 2016-01-31 DIAGNOSIS — Z833 Family history of diabetes mellitus: Secondary | ICD-10-CM

## 2016-01-31 DIAGNOSIS — F1099 Alcohol use, unspecified with unspecified alcohol-induced disorder: Secondary | ICD-10-CM

## 2016-01-31 DIAGNOSIS — T383X2A Poisoning by insulin and oral hypoglycemic [antidiabetic] drugs, intentional self-harm, initial encounter: Principal | ICD-10-CM

## 2016-01-31 DIAGNOSIS — F322 Major depressive disorder, single episode, severe without psychotic features: Secondary | ICD-10-CM

## 2016-01-31 DIAGNOSIS — Z794 Long term (current) use of insulin: Secondary | ICD-10-CM

## 2016-01-31 DIAGNOSIS — T1491XA Suicide attempt, initial encounter: Secondary | ICD-10-CM

## 2016-01-31 DIAGNOSIS — Z79899 Other long term (current) drug therapy: Secondary | ICD-10-CM

## 2016-01-31 LAB — GLUCOSE, CAPILLARY
GLUCOSE-CAPILLARY: 175 mg/dL — AB (ref 65–99)
GLUCOSE-CAPILLARY: 223 mg/dL — AB (ref 65–99)
GLUCOSE-CAPILLARY: 504 mg/dL — AB (ref 65–99)
GLUCOSE-CAPILLARY: 578 mg/dL — AB (ref 65–99)
Glucose-Capillary: 108 mg/dL — ABNORMAL HIGH (ref 65–99)
Glucose-Capillary: 172 mg/dL — ABNORMAL HIGH (ref 65–99)
Glucose-Capillary: 180 mg/dL — ABNORMAL HIGH (ref 65–99)
Glucose-Capillary: 253 mg/dL — ABNORMAL HIGH (ref 65–99)
Glucose-Capillary: 328 mg/dL — ABNORMAL HIGH (ref 65–99)
Glucose-Capillary: 351 mg/dL — ABNORMAL HIGH (ref 65–99)
Glucose-Capillary: 376 mg/dL — ABNORMAL HIGH (ref 65–99)

## 2016-01-31 MED ORDER — INSULIN NPH (HUMAN) (ISOPHANE) 100 UNIT/ML ~~LOC~~ SUSP
10.0000 [IU] | Freq: Every day | SUBCUTANEOUS | Status: DC
  Administered 2016-01-31: 10 [IU] via SUBCUTANEOUS
  Filled 2016-01-31: qty 10

## 2016-01-31 MED ORDER — INSULIN NPH (HUMAN) (ISOPHANE) 100 UNIT/ML ~~LOC~~ SUSP
20.0000 [IU] | Freq: Every day | SUBCUTANEOUS | Status: DC
  Administered 2016-02-01: 20 [IU] via SUBCUTANEOUS
  Filled 2016-01-31: qty 10

## 2016-01-31 MED ORDER — PNEUMOCOCCAL VAC POLYVALENT 25 MCG/0.5ML IJ INJ
0.5000 mL | INJECTION | INTRAMUSCULAR | Status: AC
  Administered 2016-02-01: 0.5 mL via INTRAMUSCULAR
  Filled 2016-01-31: qty 0.5

## 2016-01-31 MED ORDER — INSULIN ASPART 100 UNIT/ML ~~LOC~~ SOLN
8.0000 [IU] | Freq: Once | SUBCUTANEOUS | Status: AC
  Administered 2016-01-31: 8 [IU] via SUBCUTANEOUS

## 2016-01-31 MED ORDER — INFLUENZA VAC SPLIT QUAD 0.5 ML IM SUSY
0.5000 mL | PREFILLED_SYRINGE | INTRAMUSCULAR | Status: DC
  Filled 2016-01-31: qty 0.5

## 2016-01-31 MED ORDER — FLUOXETINE HCL 20 MG PO CAPS
20.0000 mg | ORAL_CAPSULE | Freq: Every day | ORAL | Status: DC
  Administered 2016-01-31 – 2016-02-03 (×4): 20 mg via ORAL
  Filled 2016-01-31 (×4): qty 1

## 2016-01-31 MED ORDER — INSULIN ASPART 100 UNIT/ML ~~LOC~~ SOLN
3.0000 [IU] | Freq: Three times a day (TID) | SUBCUTANEOUS | Status: DC
  Administered 2016-01-31 – 2016-02-01 (×3): 3 [IU] via SUBCUTANEOUS

## 2016-01-31 MED ORDER — HYDROXYZINE HCL 25 MG PO TABS: 25.0000 mg | ORAL_TABLET | Freq: Every evening | ORAL | Status: DC | PRN

## 2016-01-31 NOTE — Progress Notes (Signed)
S: No new CO, feels like eating O:BP 140/70 (BP Location: Right Arm)   Pulse 78   Temp 97.8 F (36.6 C) (Axillary)   Resp 13   Ht 5\' 11"  (1.803 m)   Wt 86.3 kg (190 lb 4.1 oz) Comment: stood to scale   SpO2 98%   BMI 26.54 kg/m   Intake/Output Summary (Last 24 hours) at 01/31/16 0718 Last data filed at 01/30/16 1900  Gross per 24 hour  Intake              345 ml  Output             4071 ml  Net            -3726 ml   Weight change: -5.6 kg (-12 lb 5.5 oz) LMB:EMLJQ and alert CVS:RRR Resp:Clear Abd:+ BS NTND Ext: 0 edema  LUA AVF + bruit NEURO:CNI M&SI  Ox3 no asterixis   . calcium acetate  2,668 mg Oral TID WC  . [START ON 02/04/2016] darbepoetin (ARANESP) injection - DIALYSIS  60 mcg Intravenous Q Thu-HD  . doxercalciferol  8 mcg Intravenous Q T,Th,Sa-HD  . ferric gluconate (FERRLECIT/NULECIT) IV  62.5 mg Intravenous Q Sat-HD  . heparin  5,000 Units Subcutaneous Q8H  . hydrALAZINE  100 mg Oral QHS  . insulin aspart  0-9 Units Subcutaneous Q4H  . multivitamin  1 tablet Oral QHS   No results found. BMET    Component Value Date/Time   NA 127 (L) 01/30/2016 0055   K 4.1 01/30/2016 0055   CL 95 (L) 01/30/2016 0055   CO2 23 01/30/2016 0055   GLUCOSE 704 (HH) 01/30/2016 0806   BUN 42 (H) 01/30/2016 0055   CREATININE 7.04 (H) 01/30/2016 0055   CALCIUM 7.3 (L) 01/30/2016 0055   GFRNONAA 9 (L) 01/30/2016 0055   GFRAA 11 (L) 01/30/2016 0055   CBC    Component Value Date/Time   WBC 5.7 01/29/2016 0544   RBC 3.41 (L) 01/29/2016 0544   HGB 10.0 (L) 01/29/2016 0544   HCT 30.7 (L) 01/29/2016 0544   PLT 245 01/29/2016 0544   MCV 90.0 01/29/2016 0544   MCH 29.3 01/29/2016 0544   MCHC 32.6 01/29/2016 0544   RDW 14.1 01/29/2016 0544   LYMPHSABS 0.7 01/28/2016 2113   MONOABS 0.4 01/28/2016 2113   EOSABS 0.0 01/28/2016 2113   BASOSABS 0.0 01/28/2016 2113     Assessment: 1. Suicide attempt 2. Noncompliance with HD 3. HTN, BP better after HD 4. Sec HPTH on  hectorol 5. DM 6. Anemia on aranesp 7. ESRD  TTS [employer]  Plan: 1. Waiting for Psych to see 2. Will use 86.5kg as new DW 3. Next HD tues  Sahaj Bona T

## 2016-01-31 NOTE — Progress Notes (Signed)
[place] TEAM 1 - Stepdown/ICU TEAM  BOBBYJOE PABST  OIZ:[id number] DOB: [birth year] DOA: 01/28/2016 PCP: [name], MD    Brief Narrative:  35 y.o. male with history of ESRD on HD, DM1, HTN, and anemia who presented after being picked up by the [facility] Department after patient had been attempting to harm himself with scissors. Patient was reportedly tazed.   Upon arrival to the ED patient was noted to have blood sugars in the 40s for which 2 A of D50 were given and subsequently a D10 drip was initiated. Patient came to admitted taking 40 units of NPH and an unknown amount of regular insulin prior to being transported to the hospital with the intent to harm himself. IVC was completed in the ED.    Subjective: The patient is alert and interactive today though somewhat flat and withdrawn.  He denies chest pain shortness breath fevers or chills.  He reports a good appetite.  Assessment & Plan:  Suicide attempt by insulin overdose and w/ scissores - IVC by ED  - sitter to bedside - Psychiatry consulted today for evaluation  Hypoglycemia due to insulin OD - CBG very labile - exercising great care and treatment of hypoglycemia - refractory hypoglycemia appears to have resolved  Uncontrolled DM 1 - As per discussion above   Essential hypertension - Blood pressure reasonably controlled at present  ESRD on HD  - Nephrology following  Filed Weights   01/30/16 0424 01/30/16 1236 01/30/16 1700  Weight: 90.7 kg (199 lb 15.3 oz) 89.8 kg (197 lb 15.6 oz) 86.3 kg (190 lb 4.1 oz)    Anemia of chronic disease - Hemoglobin stable at 10  Seizure D/O  DVT prophylaxis: SQ heparin  Code Status: FULL CODE Family Communication: no family present at time of exam  Disposition Plan: Monitor in step down bed given lability of blood sugars and propensity to severe hypoglycemia  Consultants:  Nephrology  Psychiatry - called 11/26  Procedures: none  Antimicrobials:    none   Objective: Blood pressure 122/63, pulse 78, temperature 98 F (36.7 C), temperature source Axillary, resp. rate 15, height 5\' 11"  (1.803 m), weight 86.3 kg (190 lb 4.1 oz), SpO2 92 %.  Intake/Output Summary (Last 24 hours) at 01/31/16 1110 Last data filed at 01/31/16 0901  Gross per 24 hour  Intake              465 ml  Output             3571 ml  Net            -3106 ml   Filed Weights   01/30/16 0424 01/30/16 1236 01/30/16 1700  Weight: 90.7 kg (199 lb 15.3 oz) 89.8 kg (197 lb 15.6 oz) 86.3 kg (190 lb 4.1 oz)    Examination: General: No acute respiratory distress - alert with flat affect Lungs: Clear to auscultation bilaterally - no wheeze Cardiovascular: Regular rate and rhythm  Abdomen: Nontender, nondistended, soft, bowel sounds positive, no rebound, no ascites, no appreciable mass Extremities: No edema bilateral lower extremities  CBC:  Recent Labs Lab 01/27/16 2343 01/28/16 2113 01/29/16 0544  WBC 5.4 6.7 5.7  NEUTROABS  --  5.5  --   HGB 10.0* 10.5* 10.0*  HCT 31.1* 32.0* 30.7*  MCV 90.7 90.1 90.0  PLT 248 254 338   Basic Metabolic Panel:  Recent Labs Lab 01/27/16 2343 01/28/16 2113 01/29/16 0544 01/29/16 1430 01/30/16 0014 01/30/16 0055 01/30/16 0806  NA  139 142 138  --   --  127*  --   K 4.2 4.2 4.7  --   --  4.1  --   CL 107 111 110  --   --  95*  --   CO2 18* 19* 18*  --   --  23  --   GLUCOSE 117* 46* 157*  --  890* 715* 704*  BUN 71* 68* 69*  --   --  42*  --   CREATININE 9.49* 9.44* 9.41*  --   --  7.04*  --   CALCIUM 8.3* 8.5* 8.2*  --   --  7.3*  --   PHOS  --   --   --  3.0  --   --   --    GFR: Estimated Creatinine Clearance: 15.9 mL/min (by C-G formula based on SCr of 7.04 mg/dL (H)).  Liver Function Tests:  Recent Labs Lab 01/27/16 2343 01/28/16 2113 01/29/16 0544  AST 71* 41 27  ALT 69* 56 45  ALKPHOS 84 81 76  BILITOT 0.7 0.7 1.0  PROT 7.1 7.3 6.4*  ALBUMIN 3.6 3.6 3.1*    Recent Labs Lab 01/27/16 2343   LIPASE 72*    Cardiac Enzymes:  Recent Labs Lab 01/28/16 2113  CKTOTAL 449*    HbA1C: Hemoglobin A1C  Date/Time Value Ref Range Status  01/08/2016 09:37 AM 9.1  Final  11/06/2015 04:15 PM 9.6  Final   Hgb A1c MFr Bld  Date/Time Value Ref Range Status  08/31/2014 04:42 AM 10.3 (H) 4.8 - 5.6 % Final    Comment:    (NOTE)         Pre-diabetes: 5.7 - 6.4         Diabetes: >6.4         Glycemic control for adults with diabetes: <7.0   07/18/2013 02:58 PM 7.9 (H) <5.7 % Final    Comment:    (NOTE)                                                                       According to the ADA Clinical Practice Recommendations for 2011, when HbA1c is used as a screening test:  >=6.5%   Diagnostic of Diabetes Mellitus           (if abnormal result is confirmed) 5.7-6.4%   Increased risk of developing Diabetes Mellitus References:Diagnosis and Classification of Diabetes Mellitus,Diabetes ZHGD,9242,68(TMHDQ 1):S62-S69 and Standards of Medical Care in         Diabetes - 2011,Diabetes QIWL,7989,21 (Suppl 1):S11-S61.    CBG:  Recent Labs Lab 01/31/16 0150 01/31/16 0247 01/31/16 0500 01/31/16 0643 01/31/16 0814  GLUCAP 175* 172* 180* 328* 376*    Recent Results (from the past 240 hour(s))  MRSA PCR Screening     Status: None   Collection Time: 01/29/16 11:00 AM  Result Value Ref Range Status   MRSA by PCR NEGATIVE NEGATIVE Final    Comment:        The GeneXpert MRSA Assay (FDA approved for NASAL specimens only), is one component of a comprehensive MRSA colonization surveillance program. It is not intended to diagnose MRSA infection nor to guide or monitor treatment for MRSA infections.  Scheduled Meds: . calcium acetate  2,668 mg Oral TID WC  . [START ON 02/04/2016] darbepoetin (ARANESP) injection - DIALYSIS  60 mcg Intravenous Q Thu-HD  . doxercalciferol  8 mcg Intravenous Q T,Th,Sa-HD  . ferric gluconate (FERRLECIT/NULECIT) IV  62.5 mg Intravenous Q  Sat-HD  . heparin  5,000 Units Subcutaneous Q8H  . hydrALAZINE  100 mg Oral QHS  . insulin aspart  0-9 Units Subcutaneous Q4H  . insulin NPH Human  10 Units Subcutaneous QAC breakfast  . multivitamin  1 tablet Oral QHS    LOS: 2 days   Cherene Altes, MD [facility] Office  [phone] Pager - Text Page per Shea Evans as per below:  On-Call/Text Page:      [email or web address]      password [email or web address]  If 7PM-7AM, please contact night-coverage [email or web address] Password [email or web address] 01/31/2016, 11:10 AM

## 2016-01-31 NOTE — Consult Note (Signed)
[place] Psychiatry Consult   Reason for Consult:  Intentional drug (insulin) overdose and SIB Referring Physician:  Dr. Thereasa Solo Patient Identification: Edward Colon MRN:  [id number] Principal Diagnosis: Suicide attempt by substance overdose [facility]) Diagnosis:   Patient Active Problem List   Diagnosis Date Noted  . Involuntary commitment [Z04.6] 01/29/2016  . Suicide attempt by substance overdose ([place]) [T65.92XA] 01/28/2016  . ESRD on dialysis ([place]) [N18.6, Z99.2] 03/07/2015  . Hyperglycemia [R73.9] 03/06/2015  . Acute on chronic renal failure ([place]) [N17.9, N18.9] 02/27/2015  . URI (upper respiratory infection) [J06.9] 02/27/2015  . Acute-on-chronic kidney injury ([place]) [N17.9, N18.9] 02/27/2015  . Rhabdomyolysis [M62.82] 08/31/2014  . Hypoglycemia [E16.2] 08/30/2014  . Type 1 diabetes ([place]) [E10.9] 08/30/2014  . CKD stage 2 due to type 2 diabetes mellitus ([place]) [M41.58, N18.2] 08/30/2014  . Anemia [D64.9] 08/30/2014  . Swelling of arm [M79.89] 05/31/2013  . ARF (acute renal failure) ([place]) [N17.9] 05/31/2013  . Hyperkalemia [E87.5] 05/31/2013  . Seizure ([place]) [R56.9] 05/31/2013  . Seizures ([place]) [R56.9]   . Hypertension [I10]     Total Time spent with patient: 1 hour  Subjective:   Edward Colon is a 35 y.o. male patient admitted with intentional overdose of insulin as a suicide attempt.  HPI:  Chief Complaint: Suicide attempt  HPI: Edward Colon is a 35 y.o. male seen, chart reviewed for the face to face psychiatric consultation and evaluation for depression and suicide ideation and status post intentional overdose of insulin as a suicide attempt. Patient endorses that he has been suffering with type 1 DM, challenging to control the blood sugars, ESRD with hemodialysis, misses his appointment often and also severely disappointed with his girl friend's lack of understanding his medical condition and also unhappy about her willingness to visit his  family including his parents home for thanksgiving and his vehicle been in repair stand. He was upset about he could not get the phone call in time due to phone went on silence mode when he is expecting a cab ride to his family home. He has taken full syringe of regular insulin with intent to end his life and also has scissors in his hand and thinking about slicing his head but could not do it due to his blood sugars are falling down and he could not think and act right at that time. His GF called [employer] who tased him due to scissors in his hand prior to bring him to hospital. He has no history of drug abuse or alcohol. He has been working hard with several jobs and has two 33 years old twins.    Medical history: Patient with medical history significant of ESRD on HD, type 1 diabetes, HTN, and anemia; who presents after being picked up by [facility] Department after patient had been attempting to harm himself with scissors. Patient was noted to be acting out and was reportedly tased. Upon arrival to the emergency department patient was noted to have low blood sugars in the 40s for which patient received 2 A of D50 and subsequently had to be placed on a D10 drip. Patient did come to and noted that he often misses his hemodialysis appointments because it is not accommodating to his work schedule. He also admits to taking approximately 40 units of NPH and some unknown amount of regular insulin as well prior to being transported to the hospital tonight with the intent to harm himself. He complains of generalized achiness all over, leg swelling, and mild  shortness of breath at this time. He denies having any chest pain, dysuria, fever, or seizure-like activity. He reports that his last dialysis session may have been approximately 2 weeks ago and he still makes a little bit of urine.   ED Course: Upon arrival to the emergency department patient was IVC. Lab work revealed no hyperkalemia, creatinine 9.44, BUN 68.  Patient O2 sats saturations were maintained on room air. Due to the need of frequent glucose checks patient was recommended to be sent to stepdown.  Past Psychiatric History: Patient denied history of psychiatric illness.  Risk to Self:   Risk to Others:   Prior Inpatient Therapy:   Prior Outpatient Therapy:    Past Medical History:  Past Medical History:  Diagnosis Date  . Anemia   . Blind one eye    left eye  . Chronic kidney disease   . Diabetes mellitus without complication ([place])   . Heart murmur    denies any problems with it  . Hypertension   . Neuropathy ([place])   . Seizures ([place])    none for 2 months as of 03/31/15  . Type 1 diabetes [facility])     Past Surgical History:  Procedure Laterality Date  . AV FISTULA PLACEMENT Right 03/03/2015   Procedure: RIGHT RADIO-CEPHALIC ARTERIOVENOUS (AV) FISTULA CREATION;  Surgeon: Serafina Mitchell, MD;  Location: [facility] OR;  Service: Vascular;  Laterality: Right;  . AV FISTULA PLACEMENT Left 06/15/2015   Procedure: INSERTION OF LEFT UPPER ARM  ARTERIOVENOUS (AV) 82m x 50cm GORE-TEX GRAFT;  Surgeon: CElam Dutch MD;  [facility]  Service: Vascular;  Laterality: Left;  . BASCILIC VEIN TRANSPOSITION Left 04/01/2015   Procedure: BASCILIC VEIN TRANSPOSITION-LEFT ARM- FIRST STAGE;  Surgeon: CElam Dutch MD;  [place] [facility]  Service: Vascular;  Laterality: Left;  . EYE SURGERY Right    for diabetic retinopathy  . INSERTION OF DIALYSIS CATHETER Right 03/03/2015   Procedure: INSERTION OF DIALYSIS CATHETER;  Surgeon: VSerafina Mitchell MD;  [facility];  Service: Vascular;  Laterality: Right;   Family History:  Family History  Problem Relation Age of Onset  . Diabetes Neg Hx    Family Psychiatric  History: Denied family history of psychiatric illness. He is oldest of seven siblings and youngest one stay with his parents.  Social History:  History  Alcohol Use  . Yes    Comment: rare     History  Drug Use No    Social History    Social History  . Marital status: Single    Spouse name: N/A  . Number of children: N/A  . Years of education: N/A   Social History Main Topics  . Smoking status: Never Smoker  . Smokeless tobacco: Never Used  . Alcohol use Yes     Comment: rare  . Drug use: No  . Sexual activity: No   Other Topics Concern  . Not on file   Social History Narrative  . No narrative on file   Additional Social History:    Allergies:  No Known Allergies  Labs:  Results for orders placed or performed during the hospital encounter of 01/28/16 (from the past 48 hour(s))  Glucose, capillary     Status: Abnormal   Collection Time: 01/29/16 12:06 PM  Result Value Ref Range   Glucose-Capillary 360 (H) 65 - 99 mg/dL  Phosphorus     Status: None   Collection Time: 01/29/16  2:30 PM  Result Value Ref  Range   Phosphorus 3.0 2.5 - 4.6 mg/dL  Glucose, capillary     Status: Abnormal   Collection Time: 01/29/16  3:58 PM  Result Value Ref Range   Glucose-Capillary 244 (H) 65 - 99 mg/dL  Glucose, capillary     Status: Abnormal   Collection Time: 01/29/16  7:12 PM  Result Value Ref Range   Glucose-Capillary 287 (H) 65 - 99 mg/dL   Comment 1 Notify RN   Glucose, capillary     Status: Abnormal   Collection Time: 01/30/16 12:08 AM  Result Value Ref Range   Glucose-Capillary >600 (HH) 65 - 99 mg/dL  Glucose, random     Status: Abnormal   Collection Time: 01/30/16 12:14 AM  Result Value Ref Range   Glucose, Bld 890 (HH) 65 - 99 mg/dL    Comment: CRITICAL RESULT CALLED TO, READ BACK BY AND VERIFIED WITH: EVERETTE D,RN 01/30/16 0043 WAYK   Basic metabolic panel     Status: Abnormal   Collection Time: 01/30/16 12:55 AM  Result Value Ref Range   Sodium 127 (L) 135 - 145 mmol/L    Comment: DELTA CHECK NOTED   Potassium 4.1 3.5 - 5.1 mmol/L   Chloride 95 (L) 101 - 111 mmol/L   CO2 23 22 - 32 mmol/L   Glucose, Bld 715 (HH) 65 - 99 mg/dL    Comment: CRITICAL RESULT CALLED TO, READ BACK BY AND  VERIFIED WITH: EVERETTE D,RN 01/30/16 0325 WAYK    BUN 42 (H) 6 - 20 mg/dL   Creatinine, Ser 7.04 (H) 0.61 - 1.24 mg/dL   Calcium 7.3 (L) 8.9 - 10.3 mg/dL   GFR calc non Af Amer 9 (L) >60 mL/min   GFR calc Af Amer 11 (L) >60 mL/min    Comment: (NOTE) The eGFR has been calculated using the CKD EPI equation. This calculation has not been validated in all clinical situations. eGFR's persistently <60 mL/min signify possible Chronic Kidney Disease.    Anion gap 9 5 - 15  Glucose, capillary     Status: Abnormal   Collection Time: 01/30/16  2:03 AM  Result Value Ref Range   Glucose-Capillary >600 (HH) 65 - 99 mg/dL  Glucose, capillary     Status: Abnormal   Collection Time: 01/30/16  2:33 AM  Result Value Ref Range   Glucose-Capillary >600 (HH) 65 - 99 mg/dL  Glucose, capillary     Status: Abnormal   Collection Time: 01/30/16  5:48 AM  Result Value Ref Range   Glucose-Capillary 32 (LL) 65 - 99 mg/dL   Comment 1 Notify RN   Glucose, capillary     Status: Abnormal   Collection Time: 01/30/16  6:06 AM  Result Value Ref Range   Glucose-Capillary 115 (H) 65 - 99 mg/dL   Comment 1 Notify RN   Glucose, capillary     Status: Abnormal   Collection Time: 01/30/16  7:24 AM  Result Value Ref Range   Glucose-Capillary <10 (LL) 65 - 99 mg/dL  Glucose, capillary     Status: Abnormal   Collection Time: 01/30/16  7:41 AM  Result Value Ref Range   Glucose-Capillary 25 (LL) 65 - 99 mg/dL   Comment 1 Call MD NNP PA CNM   Glucose, capillary     Status: Abnormal   Collection Time: 01/30/16  7:47 AM  Result Value Ref Range   Glucose-Capillary 147 (H) 65 - 99 mg/dL  Glucose, random     Status: Abnormal   Collection Time: 01/30/16  8:06 AM  Result Value Ref Range   Glucose, Bld 704 (HH) 65 - 99 mg/dL    Comment: CRITICAL RESULT CALLED TO, READ BACK BY AND VERIFIED WITH: J.Rosine Abe [id number] 01/30/16 G.MCADOO   Glucose, capillary     Status: Abnormal   Collection Time: 01/30/16  8:11 AM  Result  Value Ref Range   Glucose-Capillary 114 (H) 65 - 99 mg/dL  Glucose, capillary     Status: Abnormal   Collection Time: 01/30/16  8:27 AM  Result Value Ref Range   Glucose-Capillary 111 (H) 65 - 99 mg/dL  Glucose, capillary     Status: None   Collection Time: 01/30/16  8:54 AM  Result Value Ref Range   Glucose-Capillary 95 65 - 99 mg/dL  Glucose, capillary     Status: None   Collection Time: 01/30/16  9:42 AM  Result Value Ref Range   Glucose-Capillary 98 65 - 99 mg/dL  Glucose, capillary     Status: Abnormal   Collection Time: 01/30/16 10:39 AM  Result Value Ref Range   Glucose-Capillary 135 (H) 65 - 99 mg/dL  Glucose, capillary     Status: Abnormal   Collection Time: 01/30/16 11:47 AM  Result Value Ref Range   Glucose-Capillary 172 (H) 65 - 99 mg/dL  Glucose, capillary     Status: Abnormal   Collection Time: 01/30/16  2:08 PM  Result Value Ref Range   Glucose-Capillary 217 (H) 65 - 99 mg/dL  Glucose, capillary     Status: Abnormal   Collection Time: 01/30/16  3:24 PM  Result Value Ref Range   Glucose-Capillary 225 (H) 65 - 99 mg/dL  Glucose, capillary     Status: Abnormal   Collection Time: 01/30/16  5:54 PM  Result Value Ref Range   Glucose-Capillary 433 (H) 65 - 99 mg/dL  Glucose, capillary     Status: Abnormal   Collection Time: 01/30/16  8:06 PM  Result Value Ref Range   Glucose-Capillary >600 (HH) 65 - 99 mg/dL   Comment 1 Notify RN    Comment 2 Document in Chart   Glucose, capillary     Status: Abnormal   Collection Time: 01/30/16  9:25 PM  Result Value Ref Range   Glucose-Capillary 518 (HH) 65 - 99 mg/dL  Glucose, capillary     Status: Abnormal   Collection Time: 01/30/16 10:27 PM  Result Value Ref Range   Glucose-Capillary 526 (HH) 65 - 99 mg/dL  Glucose, capillary     Status: Abnormal   Collection Time: 01/30/16 11:48 PM  Result Value Ref Range   Glucose-Capillary 299 (H) 65 - 99 mg/dL   Comment 1 Notify RN   Glucose, capillary     Status: Abnormal    Collection Time: 01/31/16 12:51 AM  Result Value Ref Range   Glucose-Capillary 223 (H) 65 - 99 mg/dL  Glucose, capillary     Status: Abnormal   Collection Time: 01/31/16  1:50 AM  Result Value Ref Range   Glucose-Capillary 175 (H) 65 - 99 mg/dL  Glucose, capillary     Status: Abnormal   Collection Time: 01/31/16  2:47 AM  Result Value Ref Range   Glucose-Capillary 172 (H) 65 - 99 mg/dL  Glucose, capillary     Status: Abnormal   Collection Time: 01/31/16  5:00 AM  Result Value Ref Range   Glucose-Capillary 180 (H) 65 - 99 mg/dL   Comment 1 Notify RN   Glucose, capillary     Status: Abnormal   Collection Time:  01/31/16  6:43 AM  Result Value Ref Range   Glucose-Capillary 328 (H) 65 - 99 mg/dL  Glucose, capillary     Status: Abnormal   Collection Time: 01/31/16  8:14 AM  Result Value Ref Range   Glucose-Capillary 376 (H) 65 - 99 mg/dL    Current Facility-Administered Medications  Medication Dose Route Frequency Provider Last Rate Last Dose  . acetaminophen (TYLENOL) tablet 650 mg  650 mg Oral Q6H PRN Norval Morton, MD       Or  . acetaminophen (TYLENOL) suppository 650 mg  650 mg Rectal Q6H PRN Norval Morton, MD      . albuterol (PROVENTIL) (2.5 MG/3ML) 0.083% nebulizer solution 2.5 mg  2.5 mg Nebulization Q2H PRN Norval Morton, MD      . calcium acetate (PHOSLO) capsule 1,334 mg  1,334 mg Oral PRN Cherene Altes, MD      . calcium acetate (PHOSLO) capsule 2,668 mg  2,668 mg Oral TID WC Cherene Altes, MD   2,668 mg at 01/31/16 0836  . [START ON 02/04/2016] Darbepoetin Alfa (ARANESP) injection 60 mcg  60 mcg Intravenous Q Thu-HD Fleet Contras, MD      . dextrose 50 % solution 25 mL  25 mL Intravenous PRN Lily Kocher, MD   25 mL at 01/30/16 0739  . doxercalciferol (HECTOROL) injection 8 mcg  8 mcg Intravenous Q T,Th,Sa-HD Alric Seton, PA-C   8 mcg at 01/30/16 1700  . ferric gluconate (NULECIT) 62.5 mg in sodium chloride 0.9 % 100 mL IVPB  62.5 mg Intravenous Q  Sat-HD Alric Seton, PA-C   62.5 mg at 01/30/16 1600  . heparin injection 5,000 Units  5,000 Units Subcutaneous Q8H Norval Morton, MD   5,000 Units at 01/31/16 0600  . hydrALAZINE (APRESOLINE) injection 10 mg  10 mg Intravenous Q4H PRN Norval Morton, MD      . hydrALAZINE (APRESOLINE) tablet 100 mg  100 mg Oral QHS Norval Morton, MD   100 mg at 01/30/16 2255  . insulin aspart (novoLOG) injection 0-9 Units  0-9 Units Subcutaneous Q4H Cherene Altes, MD   7 Units at 01/31/16 [phone]  . insulin aspart (novoLOG) injection 3 Units  3 Units Subcutaneous TID WC Cherene Altes, MD      . insulin NPH Human (HUMULIN N,NOVOLIN N) injection 10 Units  10 Units Subcutaneous QAC breakfast Cherene Altes, MD   10 Units at 01/31/16 [phone]  . multivitamin (RENA-VIT) tablet 1 tablet  1 tablet Oral QHS Norval Morton, MD   1 tablet at 01/30/16 2255  . ondansetron (ZOFRAN) tablet 4 mg  4 mg Oral Q6H PRN Norval Morton, MD       Or  . ondansetron (ZOFRAN) injection 4 mg  4 mg Intravenous Q6H PRN Norval Morton, MD        Musculoskeletal: Strength & Muscle Tone: within normal limits Gait & Station: normal Patient leans: N/A  Psychiatric Specialty Exam: Physical Exam as per history and physical  ROS depression, generalized weakness, and has a lot of stress related to uncontrollable blood sugars and relationship problems. No Fever-chills, No Headache, No changes with Vision or hearing, reports vertigo No problems swallowing food or Liquids, No Chest pain, Cough or Shortness of Breath, No Abdominal pain, No Nausea or Vommitting, Bowel movements are regular, No Blood in stool or Urine, No dysuria, No new skin rashes or bruises, No new joints pains-aches,  No new weakness, tingling, numbness  in any extremity, No recent weight gain or loss, No polyuria, polydypsia or polyphagia,   A full 10 point Review of Systems was done, except as stated above, all other Review of Systems were negative.   Blood pressure 122/63, pulse 78, temperature 98 F (36.7 C), temperature source Axillary, resp. rate 15, height '5\' 11"'  (1.803 m), weight 86.3 kg (190 lb 4.1 oz), SpO2 92 %.Body mass index is 26.54 kg/m.  General Appearance: Guarded  Eye Contact:  Good  Speech:  Clear and Coherent  Volume:  Decreased  Mood:  Anxious, Depressed and Hopeless  Affect:  Constricted and Depressed  Thought Process:  Coherent and Goal Directed  Orientation:  Full (Time, Place, and Person)  Thought Content:  Rumination  Suicidal Thoughts:  Yes.  with intent/plan  Homicidal Thoughts:  No  Memory:  Immediate;   Good Recent;   Fair Remote;   Fair  Judgement:  Impaired  Insight:  Fair  Psychomotor Activity:  Decreased  Concentration:  Concentration: Fair and Attention Span: Fair  Recall:  Good  Fund of Knowledge:  Good  Language:  Good  Akathisia:  Negative  Handed:  Right  AIMS (if indicated):     Assets:  Communication Skills Desire for Improvement Financial Resources/Insurance Housing Intimacy Leisure Time Resilience Social Support Talents/Skills Transportation Vocational/Educational  ADL's:  Intact  Cognition:  WNL  Sleep:        Treatment Plan Summary: 35 years old male presented with symptoms of depression, suicide attempt followed by multiple stresses related to chronic medical condition and relationship problems and wants to end his life. He has no previous mental health treatment or suicide attempts.   Safety concerns: continue safety sitter Start Fluoxetine 20 mg PO QD for depression Start Hydroxyzine 25 mg PO Qhs and may repeat in hour PRN Refer to LCSW for acute psych placement  Daily contact with patient to assess and evaluate symptoms and progress in treatment and Medication management  Disposition: Recommend psychiatric Inpatient admission when medically cleared. Supportive therapy provided about ongoing stressors.  Ambrose Finland, MD 01/31/2016 11:29 AM

## 2016-02-01 ENCOUNTER — Encounter (HOSPITAL_COMMUNITY): Payer: Self-pay | Admitting: General Practice

## 2016-02-01 LAB — RENAL FUNCTION PANEL
ANION GAP: 15 (ref 5–15)
Albumin: 3.1 g/dL — ABNORMAL LOW (ref 3.5–5.0)
BUN: 56 mg/dL — ABNORMAL HIGH (ref 6–20)
CHLORIDE: 93 mmol/L — AB (ref 101–111)
CO2: 23 mmol/L (ref 22–32)
Calcium: 9 mg/dL (ref 8.9–10.3)
Creatinine, Ser: 8.93 mg/dL — ABNORMAL HIGH (ref 0.61–1.24)
GFR calc non Af Amer: 7 mL/min — ABNORMAL LOW (ref >60)
GFR, EST AFRICAN AMERICAN: 8 mL/min — AB (ref >60)
GLUCOSE: 394 mg/dL — AB (ref 65–99)
Phosphorus: 6.3 mg/dL — ABNORMAL HIGH (ref 2.5–4.6)
Potassium: 4.8 mmol/L (ref 3.5–5.1)
SODIUM: 131 mmol/L — AB (ref 135–145)

## 2016-02-01 LAB — GLUCOSE, CAPILLARY
GLUCOSE-CAPILLARY: 224 mg/dL — AB (ref 65–99)
GLUCOSE-CAPILLARY: 343 mg/dL — AB (ref 65–99)
GLUCOSE-CAPILLARY: 438 mg/dL — AB (ref 65–99)
Glucose-Capillary: 378 mg/dL — ABNORMAL HIGH (ref 65–99)
Glucose-Capillary: 344 mg/dL — ABNORMAL HIGH (ref 65–99)
Glucose-Capillary: 111 mg/dL — ABNORMAL HIGH (ref 65–99)
Glucose-Capillary: 213 mg/dL — ABNORMAL HIGH (ref 65–99)

## 2016-02-01 MED ORDER — INSULIN NPH (HUMAN) (ISOPHANE) 100 UNIT/ML ~~LOC~~ SUSP
20.0000 [IU] | Freq: Two times a day (BID) | SUBCUTANEOUS | Status: DC
  Administered 2016-02-01 – 2016-02-03 (×4): 20 [IU] via SUBCUTANEOUS
  Filled 2016-02-01 (×3): qty 10

## 2016-02-01 MED ORDER — INSULIN ASPART 100 UNIT/ML ~~LOC~~ SOLN
0.0000 [IU] | Freq: Every day | SUBCUTANEOUS | Status: DC
  Administered 2016-02-01 – 2016-02-02 (×2): 2 [IU] via SUBCUTANEOUS

## 2016-02-01 MED ORDER — INSULIN ASPART 100 UNIT/ML ~~LOC~~ SOLN
0.0000 [IU] | Freq: Three times a day (TID) | SUBCUTANEOUS | Status: DC
  Administered 2016-02-02: 2 [IU] via SUBCUTANEOUS
  Administered 2016-02-02: 1 [IU] via SUBCUTANEOUS
  Administered 2016-02-03: 2 [IU] via SUBCUTANEOUS
  Administered 2016-02-03: 1 [IU] via SUBCUTANEOUS

## 2016-02-01 MED ORDER — INSULIN ASPART 100 UNIT/ML ~~LOC~~ SOLN
4.0000 [IU] | Freq: Three times a day (TID) | SUBCUTANEOUS | Status: DC
  Administered 2016-02-01 – 2016-02-03 (×6): 4 [IU] via SUBCUTANEOUS

## 2016-02-01 MED ORDER — NEPRO/CARBSTEADY PO LIQD
237.0000 mL | Freq: Two times a day (BID) | ORAL | Status: DC
  Filled 2016-02-01 (×2): qty 237

## 2016-02-01 NOTE — Progress Notes (Signed)
Report called to [place] RN Patient transferred via w/c and sitter. [employer], notified of patient's tranfer and which room.

## 2016-02-01 NOTE — Progress Notes (Addendum)
[place] TEAM 1 - Stepdown/ICU TEAM  Edward Colon  HLK:[id number] DOB: 30-May-1980 DOA: 01/28/2016 PCP: [name], MD    Brief Narrative:  35 y.o. male with history of ESRD on HD, DM1, HTN, and anemia who presented after being picked up by the [facility] Department after patient had been attempting to harm himself with scissors. Patient was reportedly tazed.   Upon arrival to the ED patient was noted to have blood sugars in the 40s for which 2 A of D50 were given and subsequently a D10 drip was initiated. Patient came to admitted taking 40 units of NPH and an unknown amount of regular insulin prior to being transported to the hospital with the intent to harm himself. IVC was completed in the ED.    Subjective: The patient is alert and more animated today and quite pleasant.  He denies chest pain shortness of breath fevers chills nausea or vomiting.  He reports a fair appetite.  Assessment & Plan:  Suicide attempt by insulin overdose and w/ scissors - IVC by ED  - sitter remains at bedside - Psychiatry has recommended inpatient treatment - psychiatry social worker following  Hypoglycemia due to insulin OD - CBG very labile - exercising great care in treatment of hypoglycemia - refractory hypoglycemia appears to have resolved  Very labile Uncontrolled DM 1 - As per discussion above this patient's blood sugars are very labile - slowly titrating insulin treatment upward   Essential hypertension - Blood pressure appears to drop w/ HD and then trend upward slowly on interim days - avoid med tx for now but may require med on non-HD days if trend continues   ESRD on HD  - Nephrology following  Filed Weights   01/30/16 0424 01/30/16 1236 01/30/16 1700  Weight: 90.7 kg (199 lb 15.3 oz) 89.8 kg (197 lb 15.6 oz) 86.3 kg (190 lb 4.1 oz)    Anemia of chronic disease - Hemoglobin stable  Seizure D/O  DVT prophylaxis: SQ heparin  Code Status: FULL CODE Family  Communication: no family present at time of exam  Disposition Plan: Transfer to renal floor with CBGs stabilizing - ultimate disposition is to inpatient psychiatry facility per psychiatry consultation recommendations  Consultants:  Nephrology  Psychiatry   Procedures: none  Antimicrobials:  none   Objective: Blood pressure (!) 143/88, pulse 94, temperature 97.9 F (36.6 C), temperature source Axillary, resp. rate 14, height 5\' 11"  (1.803 m), weight 86.3 kg (190 lb 4.1 oz), SpO2 100 %.  Intake/Output Summary (Last 24 hours) at 02/01/16 1520 Last data filed at 01/31/16 1825  Gross per 24 hour  Intake              240 ml  Output                0 ml  Net              240 ml   Filed Weights   01/30/16 0424 01/30/16 1236 01/30/16 1700  Weight: 90.7 kg (199 lb 15.3 oz) 89.8 kg (197 lb 15.6 oz) 86.3 kg (190 lb 4.1 oz)    Examination: General: No acute respiratory distress  Lungs: Clear to auscultation bilaterally - no wheeze or crackles Cardiovascular: Regular rate and rhythm - no M  Abdomen: Nontender, nondistended, soft, bowel sounds positive, no rebound, no ascites, no appreciable mass Extremities: No signif edema bilateral lower extremities  CBC:  Recent Labs Lab 01/27/16 2343 01/28/16 2113 01/29/16 0544  WBC 5.4  6.7 5.7  NEUTROABS  --  5.5  --   HGB 10.0* 10.5* 10.0*  HCT 31.1* 32.0* 30.7*  MCV 90.7 90.1 90.0  PLT 248 254 224   Basic Metabolic Panel:  Recent Labs Lab 01/27/16 2343 01/28/16 2113 01/29/16 0544 01/29/16 1430 01/30/16 0014 01/30/16 0055 01/30/16 0806 02/01/16 0413  NA 139 142 138  --   --  127*  --  131*  K 4.2 4.2 4.7  --   --  4.1  --  4.8  CL 107 111 110  --   --  95*  --  93*  CO2 18* 19* 18*  --   --  23  --  23  GLUCOSE 117* 46* 157*  --  890* 715* 704* 394*  BUN 71* 68* 69*  --   --  42*  --  56*  CREATININE 9.49* 9.44* 9.41*  --   --  7.04*  --  8.93*  CALCIUM 8.3* 8.5* 8.2*  --   --  7.3*  --  9.0  PHOS  --   --   --  3.0  --    --   --  6.3*   GFR: Estimated Creatinine Clearance: 12.5 mL/min (by C-G formula based on SCr of 8.93 mg/dL (H)).  Liver Function Tests:  Recent Labs Lab 01/27/16 2343 01/28/16 2113 01/29/16 0544 02/01/16 0413  AST 71* 41 27  --   ALT 69* 56 45  --   ALKPHOS 84 81 76  --   BILITOT 0.7 0.7 1.0  --   PROT 7.1 7.3 6.4*  --   ALBUMIN 3.6 3.6 3.1* 3.1*    Recent Labs Lab 01/27/16 2343  LIPASE 72*    Cardiac Enzymes:  Recent Labs Lab 01/28/16 2113  CKTOTAL 449*    HbA1C: Hemoglobin A1C  Date/Time Value Ref Range Status  01/08/2016 09:37 AM 9.1  Final  11/06/2015 04:15 PM 9.6  Final   Hgb A1c MFr Bld  Date/Time Value Ref Range Status  08/31/2014 04:42 AM 10.3 (H) 4.8 - 5.6 % Final    Comment:    (NOTE)         Pre-diabetes: 5.7 - 6.4         Diabetes: >6.4         Glycemic control for adults with diabetes: <7.0   07/18/2013 02:58 PM 7.9 (H) <5.7 % Final    Comment:    (NOTE)                                                                       According to the ADA Clinical Practice Recommendations for 2011, when HbA1c is used as a screening test:  >=6.5%   Diagnostic of Diabetes Mellitus           (if abnormal result is confirmed) 5.7-6.4%   Increased risk of developing Diabetes Mellitus References:Diagnosis and Classification of Diabetes Mellitus,Diabetes MGNO,0370,48(GQBVQ 1):S62-S69 and Standards of Medical Care in         Diabetes - 2011,Diabetes XIHW,3888,28 (Suppl 1):S11-S61.    CBG:  Recent Labs Lab 01/31/16 2358 02/01/16 0405 02/01/16 0830 02/01/16 1210 02/01/16 1336  GLUCAP 108* 378* 343* 438* 344*    Recent Results (from the past  240 hour(s))  MRSA PCR Screening     Status: None   Collection Time: 01/29/16 11:00 AM  Result Value Ref Range Status   MRSA by PCR NEGATIVE NEGATIVE Final    Comment:        The GeneXpert MRSA Assay (FDA approved for NASAL specimens only), is one component of a comprehensive MRSA  colonization surveillance program. It is not intended to diagnose MRSA infection nor to guide or monitor treatment for MRSA infections.      Scheduled Meds: . calcium acetate  2,668 mg Oral TID WC  . [START ON 02/04/2016] darbepoetin (ARANESP) injection - DIALYSIS  60 mcg Intravenous Q Thu-HD  . doxercalciferol  8 mcg Intravenous Q T,Th,Sa-HD  . ferric gluconate (FERRLECIT/NULECIT) IV  62.5 mg Intravenous Q Sat-HD  . FLUoxetine  20 mg Oral Daily  . heparin  5,000 Units Subcutaneous Q8H  . hydrALAZINE  100 mg Oral QHS  . Influenza vac split quadrivalent PF  0.5 mL Intramuscular Tomorrow-1000  . insulin aspart  0-9 Units Subcutaneous Q4H  . insulin aspart  4 Units Subcutaneous TID WC  . insulin NPH Human  20 Units Subcutaneous BID AC & HS  . multivitamin  1 tablet Oral QHS    LOS: 3 days   Cherene Altes, MD [facility] Office  [phone] Pager - Text Page per Shea Evans as per below:  On-Call/Text Page:      [email or web address]      password [email or web address]  If 7PM-7AM, please contact night-coverage [email or web address] Password [email or web address] 02/01/2016, 3:20 PM

## 2016-02-01 NOTE — Clinical Social Work Psych Assess (Signed)
Clinical Social Work Nature conservation officer  Clinical Social Worker:  Candie Chroman, LCSW Date/Time:  02/01/2016, 1:01 PM Referred By:  Other - See Comments (No consult. Patient IVC'd and psychiatrist recommending inpatient psych.) Date Referred:  02/01/16 Reason for Referral:  Behavioral Health Issues, Other - See Comment (Psychiatrist recommending inpatient psych placement. Suicide attempt prior to hospitalization.)   Presenting Symptoms/Problems  Presenting Symptoms/Problems(in person's/family's own words):  Patient pleasant today. He reports no suicidal/homicidal ideation. Patient states that on the day of his suicide attempt, he had a very low blood sugar and does not remember much of what happened.   Abuse/Neglect/Trauma History  Abuse/Neglect/Trauma History:  Denies History Abuse/Neglect/Trauma History Comments (indicate dates):  N/A   Psychiatric History  Psychiatric History:  Denies History Psychiatric Medication:  Prozac 20 mg PO daily   Current Mental Health Hospitalizations/Previous Mental Health History:  No previous mental health history.   Current Provider:  Ambrose Finland, MD (Consulted) Place and Date:  [facility] 01/31/2016  Current Medications:     Previous Inpatient Admission/Date/Reason:  10/22/2015 ESRD with dialysis.   Emotional Health/Current Symptoms  Suicide/Self Harm: None Reported Suicide Attempt in Past (date/description):  N/A  Other Harmful Behavior (ex. homicidal ideation) (describe):  N/A   Psychotic/Dissociative Symptoms  Psychotic/Dissociative Symptoms: Other - See Comment (Patient reports that at the time of the incident, he had a very low blood sugar that caused him to act strangely and he does not remember most of what happened.) Other Psychotic/Dissociative Symptoms:  None   Attention/Behavioral Symptoms  Attention/Behavioral Symptoms: Within Normal Limits Other Attention/Behavioral  Symptoms:  None   Cognitive Impairment  Cognitive Impairment:  Within Normal Limits Other Cognitive Impairment:  N/A   Mood and Adjustment  Mood and Adjustment:  Mood Congruent (Pleasant)   Stress, Anxiety, Trauma, Any Recent Loss/Stressor  Stress, Anxiety, Trauma, Any Recent Loss/Stressor: Relationship (Relationship issues that are not outside of a typical relationship. Patient works 40+ hours per week with 3 jobs.) Anxiety (frequency):  None  Phobia (specify):  None  Compulsive Behavior (specify):  None  Obsessive Behavior (specify):  None  Other Stress, Anxiety, Trauma, Any Recent Loss/Stressor:  Patient reports relationship issues that are not outside of a typical relationship. Patient works 40+ hours per week with three jobs. Dealing with stress of managing Type I Diabetes.   Substance Abuse/Use  Substance Abuse/Use: None SBIRT Completed (please refer for detailed history): Yes Self-reported Substance Use (last use and frequency):  Rare alcohol use.  Urinary Drug Screen Completed: Yes Alcohol Level:  <5   Environment/Housing/Living Arrangement  Environmental/Housing/Living Arrangement: Stable Housing (With girlfriend) Who is in the Home:  Girlfriend  Emergency Contact:  Milinda Cave (Father: [phone])   Financial  Financial: Medicare, Stable Employment, Stable Income   Patient's Strengths and Goals  Patient's Strengths and Goals (patient's own words):     Clinical Social Worker's Interpretive Summary  Clinical Social Workers Interpretive Summary:  Patient fully oriented and pleasant. Suicide sitter at bedside. Patient was admitted on 11/23 after a suicide attempt by overdose on insulin. Patient has had Type I Diabetes for 27 years. Patient states that this is a stressor for him because once he feels that he is stable and is managing well, he will have a high/low blood sugar. Patient reports no current suicidal/homicidal ideation. Patient reports  that at the time of the incident his blood sugar was low and caused him to act that way. Patient reports not remembering much of the incident.  The suicidal ideation and plan at the time was reportedly brief. Patient reports no history of substance abuse or psychiatric treatment. Patient reports drinking [place] on rare occassions because it is what his grandfather used to drink. Patient lives with his girlfriend with whom he states they have typical relationship problems as any couple does. Patient works 40+ hours per week with three jobs. Patient inquired whether he still had to go along with the mental health treatment plan. CSW explained that as of right now, we had to stay with that plan unless the psychiatrist deems otherwise. Patient expressed understanding. No further concerns. CSW encouraged patient to contact CSW as needed. CSW will continue to follow for inpatient psych placement once medically stable. IVC paperwork completed on 01/28/2016 and will need to be updated on 02/04/2016.   Disposition  Disposition: Recommend Psych CSW Continuing To Support While In [facility]

## 2016-02-01 NOTE — Progress Notes (Signed)
Patient arrived on 6East with sitter at bedside.

## 2016-02-01 NOTE — Progress Notes (Addendum)
Inpatient Diabetes Program Recommendations  AACE/ADA: New Consensus Statement on Inpatient Glycemic Control (2015)  Target Ranges:  Prepandial:   less than 140 mg/dL      Peak postprandial:   less than 180 mg/dL (1-2 hours)      Critically ill patients:  140 - 180 mg/dL   Lab Results  Component Value Date   GLUCAP 343 (H) 02/01/2016   HGBA1C 9.1 01/08/2016    Review of Glycemic Control  Diabetes history: Type 1 diabetes Outpatient Diabetes medications: NPH 40 units q AM, Novolin R-2-15 units tid with meals Current orders for Inpatient glycemic control:  Novolog sensitive tid with meals  Inpatient Diabetes Program Recommendations:   Consider change to basal of Lantus 25 units daily to (start tonight since NPH already given today) while patient is in the hospital along with prescribed meal coverage to keep CBGs more consistent. On discharge, please consider changing NPH to bid due to patient is a type 1 diabetic and qd doesn't last the whole 24 hrs.  Thank you, Nani Gasser. Cresencio Reesor, RN, MSN, CDE Inpatient Glycemic Control Team Team Pager [phone] (8am-5pm) 02/01/2016 10:22 AM

## 2016-02-01 NOTE — Care Management Note (Signed)
Case Management Note  Patient Details  Name: Edward Colon MRN: [id number] Date of Birth: January 16, 1981  Subjective/Objective:  Presents with OD, suicide attempt, hypoglcemia, htn  , ESRD .  Patient was IVC in ED.  Psych recs inpt psych.  CSW following .                  Action/Plan:   Expected Discharge Date:                  Expected Discharge Plan:  [facility]  In-House Referral:  Clinical Social Work  Discharge planning Services  CM Consult  Post Acute Care Choice:  NA Choice offered to:  NA  DME Arranged:  N/A DME Agency:  NA  HH Arranged:  NA HH Agency:  NA  Status of Service:  In process, will continue to follow  If discussed at Long Length of Stay Meetings, dates discussed:    Additional Comments:  Zenon Mayo, RN 02/01/2016, 3:31 PM

## 2016-02-02 LAB — RENAL FUNCTION PANEL
Albumin: 3.1 g/dL — ABNORMAL LOW (ref 3.5–5.0)
Anion gap: 12 (ref 5–15)
BUN: 65 mg/dL — ABNORMAL HIGH (ref 6–20)
CO2: 27 mmol/L (ref 22–32)
Calcium: 9.4 mg/dL (ref 8.9–10.3)
Chloride: 97 mmol/L — ABNORMAL LOW (ref 101–111)
Creatinine, Ser: 9.78 mg/dL — ABNORMAL HIGH (ref 0.61–1.24)
GFR calc Af Amer: 7 mL/min — ABNORMAL LOW (ref >60)
GFR calc non Af Amer: 6 mL/min — ABNORMAL LOW (ref >60)
Glucose, Bld: 284 mg/dL — ABNORMAL HIGH (ref 65–99)
Phosphorus: 4.2 mg/dL (ref 2.5–4.6)
Potassium: 4.2 mmol/L (ref 3.5–5.1)
Sodium: 136 mmol/L (ref 135–145)

## 2016-02-02 LAB — CBC
HCT: 32.2 % — ABNORMAL LOW (ref 39.0–52.0)
Hemoglobin: 10.6 g/dL — ABNORMAL LOW (ref 13.0–17.0)
MCH: 29 pg (ref 26.0–34.0)
MCHC: 32.9 g/dL (ref 30.0–36.0)
MCV: 88.2 fL (ref 78.0–100.0)
Platelets: 307 10*3/uL (ref 150–400)
RBC: 3.65 MIL/uL — ABNORMAL LOW (ref 4.22–5.81)
RDW: 13.4 % (ref 11.5–15.5)
WBC: 5.2 10*3/uL (ref 4.0–10.5)

## 2016-02-02 LAB — GLUCOSE, CAPILLARY
GLUCOSE-CAPILLARY: 137 mg/dL — AB (ref 65–99)
GLUCOSE-CAPILLARY: 261 mg/dL — AB (ref 65–99)
GLUCOSE-CAPILLARY: 223 mg/dL — AB (ref 65–99)
Glucose-Capillary: 174 mg/dL — ABNORMAL HIGH (ref 65–99)

## 2016-02-02 MED ORDER — LIDOCAINE HCL (PF) 1 % IJ SOLN: 5.0000 mL | INTRAMUSCULAR | Status: DC | PRN

## 2016-02-02 MED ORDER — DARBEPOETIN ALFA 60 MCG/0.3ML IJ SOSY: 60.0000 ug | PREFILLED_SYRINGE | INTRAMUSCULAR | Status: DC

## 2016-02-02 MED ORDER — SODIUM CHLORIDE 0.9 % IV SOLN: 100.0000 mL | INTRAVENOUS | Status: DC | PRN

## 2016-02-02 MED ORDER — DOXERCALCIFEROL 4 MCG/2ML IV SOLN
INTRAVENOUS | Status: AC
  Administered 2016-02-02: 8 ug via INTRAVENOUS
  Filled 2016-02-02: qty 4

## 2016-02-02 MED ORDER — HEPARIN SODIUM (PORCINE) 1000 UNIT/ML DIALYSIS: 1000.0000 [IU] | INTRAMUSCULAR | Status: DC | PRN

## 2016-02-02 MED ORDER — ALTEPLASE 2 MG IJ SOLR: 2.0000 mg | Freq: Once | INTRAMUSCULAR | Status: DC | PRN

## 2016-02-02 MED ORDER — LIDOCAINE-PRILOCAINE 2.5-2.5 % EX CREA: 1.0000 "application " | TOPICAL_CREAM | CUTANEOUS | Status: DC | PRN

## 2016-02-02 MED ORDER — HEPARIN SODIUM (PORCINE) 1000 UNIT/ML DIALYSIS
100.0000 [IU]/kg | INTRAMUSCULAR | Status: DC | PRN
  Filled 2016-02-02: qty 9

## 2016-02-02 MED ORDER — PENTAFLUOROPROP-TETRAFLUOROETH EX AERO: 1.0000 "application " | INHALATION_SPRAY | CUTANEOUS | Status: DC | PRN

## 2016-02-02 NOTE — Care Management Note (Signed)
Case Management Note  Patient Details  Name: CHASON MCIVER MRN: [id number] Date of Birth: 1980-12-14  Subjective/Objective:        CM following for progression and d/c planning.             Action/Plan: 02/02/2016 Noted that CSW has referred pt to [facility]. This is the only behavior health facility with access to HD in the facility. Await response.   Expected Discharge Date:                  [facility]  In-House Referral:  Clinical Social Work  Discharge planning Services  CM Consult  Post Acute Care Choice:  NA Choice offered to:  NA  DME Arranged:  N/A DME Agency:  NA  HH Arranged:  NA HH Agency:  NA  Status of Service:  In process, will continue to follow  If discussed at Long Length of Stay Meetings, dates discussed:    Additional Comments:  Adron Bene, RN 02/02/2016, 2:04 PM

## 2016-02-02 NOTE — Clinical Social Work Note (Addendum)
CSW contacted [facility] and gave patient's name and MRN for chart review. They will contact CSW if a bed comes available. Unit CSW aware.  Dayton Scrape, [place] [phone]  4:28 pm Received call from [place] at [facility] [phone]). They have two beds available today but they are being used for ED patients. She will contact CSW tomorrow regarding bed availability.  Dayton Scrape, [place]

## 2016-02-02 NOTE — Care Management Important Message (Signed)
Important Message  Patient Details  Name: Edward Colon MRN: [id number] Date of Birth: 1980/03/20   Medicare Important Message Given:  Yes    Nathen May 02/02/2016, 11:50 AM

## 2016-02-02 NOTE — Progress Notes (Signed)
   02/02/16 0900  Clinical Encounter Type  Visited With Patient  Visit Type Initial  Referral From Nurse  Consult/Referral To Chaplain  Spiritual Encounters  Spiritual Needs Emotional  Stress Factors  Patient Stress Factors Family relationships;Loss  Advance Directives (For Healthcare)  Does Patient Have a Medical Advance Directive? No    Pt not interested in ad at this time, not prepared to create ad. PT experiencing emotional trauma and possible plans of self harm, possible depression.

## 2016-02-02 NOTE — Progress Notes (Signed)
Edward Colon  YPP:[id number] DOB: [birth year] DOA: 01/28/2016 PCP: [name], MD    Brief Narrative:  35 y.o. male with history of ESRD on HD, DM1, HTN, and anemia who presented after being picked up by the [facility] after patient had been attempting to harm himself with scissors. Patient was reportedly tazed.   Upon arrival to the ED patient was noted to have blood sugars in the 40s for which 2 A of D50 were given and subsequently a D10 drip was initiated. Patient came to admitted taking 40 units of NPH and an unknown amount of regular insulin prior to being transported to the hospital with the intent to harm himself. IVC was completed in the ED.    Subjective: No complaints  Assessment & Plan:  Suicide attempt by insulin overdose and w/ scissors - IVC by ED  - sitter remains at bedside - Psychiatry has recommended inpatient treatment - psychiatry social worker following - Pt is now medically stable to go to inpatient psychiatric treatment  Hypoglycemia due to insulin OD - CBG very labile - exercising great care in treatment of hypoglycemia - refractory hypoglycemia appears to have resolved  Very labile Uncontrolled DM 1 - As per discussion above this patient's blood sugars are very labile - slowly titrating insulin treatment upward   CBG (last 3)   Recent Labs  02/01/16 1725 02/01/16 2104 02/02/16 0800  GLUCAP 111* 213* 137*   Essential hypertension - Blood pressure appears to drop w/ HD and then trend upward slowly on interim days - avoid med tx for now but may require med on non-HD days if trend continues   ESRD on HD  - Nephrology following  Filed Weights   01/30/16 1236 01/30/16 1700 02/01/16 2106  Weight: 89.8 kg (197 lb 15.6 oz) 86.3 kg (190 lb 4.1 oz) 88.2 kg (194 lb 6.4 oz)   Anemia of chronic disease - Hemoglobin stable  Seizure D/O  DVT prophylaxis: SQ heparin  Code Status: FULL CODE Family Communication: no family  present at time of exam  Disposition Plan: inpatient psychiatry facility per psychiatry consultation recommendations  Consultants:  Nephrology  Psychiatry   Procedures: none  Antimicrobials:  none   Objective: Blood pressure (!) 168/85, pulse 88, temperature 98.7 F (37.1 C), temperature source Axillary, resp. rate 18, height 5\' 11"  (1.803 m), weight 88.2 kg (194 lb 6.4 oz), SpO2 99 %.  Intake/Output Summary (Last 24 hours) at 02/02/16 1059 Last data filed at 02/02/16 0900  Gross per 24 hour  Intake              702 ml  Output                0 ml  Net              702 ml   Filed Weights   01/30/16 1236 01/30/16 1700 02/01/16 2106  Weight: 89.8 kg (197 lb 15.6 oz) 86.3 kg (190 lb 4.1 oz) 88.2 kg (194 lb 6.4 oz)   Examination: General: No acute respiratory distress  Lungs: Clear to auscultation bilaterally - no wheeze or crackles Cardiovascular: Regular rate and rhythm - no M  Abdomen: Nontender, nondistended, soft, bowel sounds positive, no rebound, no ascites, no appreciable mass Extremities: No significant edema bilateral lower extremities  CBC:  Recent Labs Lab 01/27/16 2343 01/28/16 2113 01/29/16 0544  WBC 5.4 6.7 5.7  NEUTROABS  --  5.5  --   HGB 10.0* 10.5* 10.0*  HCT 31.1* 32.0* 30.7*  MCV 90.7 90.1 90.0  PLT 248 254 195   Basic Metabolic Panel:  Recent Labs Lab 01/27/16 2343 01/28/16 2113 01/29/16 0544 01/29/16 1430 01/30/16 0014 01/30/16 0055 01/30/16 0806 02/01/16 0413  NA 139 142 138  --   --  127*  --  131*  K 4.2 4.2 4.7  --   --  4.1  --  4.8  CL 107 111 110  --   --  95*  --  93*  CO2 18* 19* 18*  --   --  23  --  23  GLUCOSE 117* 46* 157*  --  890* 715* 704* 394*  BUN 71* 68* 69*  --   --  42*  --  56*  CREATININE 9.49* 9.44* 9.41*  --   --  7.04*  --  8.93*  CALCIUM 8.3* 8.5* 8.2*  --   --  7.3*  --  9.0  PHOS  --   --   --  3.0  --   --   --  6.3*   GFR: Estimated Creatinine Clearance: 12.5 mL/min (by C-G formula based on SCr of  8.93 mg/dL (H)).  Liver Function Tests:  Recent Labs Lab 01/27/16 2343 01/28/16 2113 01/29/16 0544 02/01/16 0413  AST 71* 41 27  --   ALT 69* 56 45  --   ALKPHOS 84 81 76  --   BILITOT 0.7 0.7 1.0  --   PROT 7.1 7.3 6.4*  --   ALBUMIN 3.6 3.6 3.1* 3.1*    Recent Labs Lab 01/27/16 2343  LIPASE 72*    Cardiac Enzymes:  Recent Labs Lab 01/28/16 2113  CKTOTAL 449*    HbA1C: Hemoglobin A1C  Date/Time Value Ref Range Status  01/08/2016 09:37 AM 9.1  Final  11/06/2015 04:15 PM 9.6  Final   Hgb A1c MFr Bld  Date/Time Value Ref Range Status  08/31/2014 04:42 AM 10.3 (H) 4.8 - 5.6 % Final    Comment:    (NOTE)         Pre-diabetes: 5.7 - 6.4         Diabetes: >6.4         Glycemic control for adults with diabetes: <7.0   07/18/2013 02:58 PM 7.9 (H) <5.7 % Final    Comment:    (NOTE)                                                                       According to the ADA Clinical Practice Recommendations for 2011, when HbA1c is used as a screening test:  >=6.5%   Diagnostic of Diabetes Mellitus           (if abnormal result is confirmed) 5.7-6.4%   Increased risk of developing Diabetes Mellitus References:Diagnosis and Classification of Diabetes Mellitus,Diabetes KDTO,6712,45(YKDXI 1):S62-S69 and Standards of Medical Care in         Diabetes - 2011,Diabetes PJAS,5053,97 (Suppl 1):S11-S61.    CBG:  Recent Labs Lab 02/01/16 1210 02/01/16 1336 02/01/16 1725 02/01/16 2104 02/02/16 0800  GLUCAP 438* 344* 111* 213* 137*    Recent Results (from the past 240 hour(s))  MRSA PCR Screening     Status: None   Collection Time: 01/29/16  11:00 AM  Result Value Ref Range Status   MRSA by PCR NEGATIVE NEGATIVE Final    Comment:        The GeneXpert MRSA Assay (FDA approved for NASAL specimens only), is one component of a comprehensive MRSA colonization surveillance program. It is not intended to diagnose MRSA infection nor to guide or monitor treatment  for MRSA infections.      Scheduled Meds: . calcium acetate  2,668 mg Oral TID WC  . [START ON 02/06/2016] darbepoetin (ARANESP) injection - DIALYSIS  60 mcg Intravenous Q Sat-HD  . doxercalciferol  8 mcg Intravenous Q T,Th,Sa-HD  . feeding supplement (NEPRO CARB STEADY)  237 mL Oral BID BM  . ferric gluconate (FERRLECIT/NULECIT) IV  62.5 mg Intravenous Q Sat-HD  . FLUoxetine  20 mg Oral Daily  . heparin  5,000 Units Subcutaneous Q8H  . hydrALAZINE  100 mg Oral QHS  . Influenza vac split quadrivalent PF  0.5 mL Intramuscular Tomorrow-1000  . insulin aspart  0-5 Units Subcutaneous QHS  . insulin aspart  0-9 Units Subcutaneous TID WC  . insulin aspart  4 Units Subcutaneous TID WC  . insulin NPH Human  20 Units Subcutaneous BID AC & HS  . multivitamin  1 tablet Oral QHS    LOS: 4 days   [facility] Office  [phone] Pager - Text Page per Shea Evans as per below:  On-Call/Text Page:      [email or web address]      password [email or web address]  If 7PM-7AM, please contact night-coverage [email or web address] Password [email or web address] 02/02/2016, 10:59 AM

## 2016-02-02 NOTE — Progress Notes (Signed)
Nutrition Brief Note  Patient identified on the Malnutrition Screening Tool (MST) Report  Wt Readings from Last 15 Encounters:  02/02/16 193 lb 5.5 oz (87.7 kg)  01/27/16 198 lb (89.8 kg)  01/08/16 200 lb (90.7 kg)  11/06/15 188 lb (85.3 kg)  10/22/15 188 lb 4.4 oz (85.4 kg)  09/01/15 185 lb (83.9 kg)  08/07/15 191 lb (86.6 kg)  07/06/15 198 lb (89.8 kg)  06/15/15 192 lb (87.1 kg)  06/05/15 191 lb (86.6 kg)  05/27/15 186 lb (84.4 kg)  05/07/15 189 lb (85.7 kg)  04/01/15 196 lb (88.9 kg)  03/30/15 196 lb 11.2 oz (89.2 kg)  03/07/15 188 lb 15 oz (85.7 kg)    Body mass index is 26.97 kg/m. Patient meets criteria for overweight based on current BMI.   Current diet order is renal/carbohydrate modifed, patient is consuming approximately 100% of meals at this time. Pt reports having a good appetite currently and PTA with usual consumption of at least 3 meals a day. Labs and medications reviewed. Pt currently has Nepro Shake ordered. RD to continue with current orders.   No further nutrition interventions warranted at this time. If nutrition issues arise, please consult RD.   Corrin Parker, MS, RD, LDN Pager # [phone] After hours/ weekend pager # [phone]

## 2016-02-02 NOTE — Progress Notes (Signed)
[facility] Progress Note  TTS AF 4.25 hours 500/800 2 K 2 Ca left upper  EDW  87 AVGG heparin 10K with 2000 mid tmt Hectorol 8 Mircera 75 q 2 weeks, venofer 50/week Recent labs: hgb 9.8 11/16 34% sat iPTH 571  Dialysis Orders:   Assessment/Plan: 1.  Suicide attempt with insulin overdose/involuntary committment and hypoglycemia - per primary; psych and psych SW seeing seeing 2. ESRD -    TTS - for HD today; Access arm swelling significantly improved with removal of volume but the fact that it was much more swollen than right arm is worrisome.- Access flows have been in the 1100-1200 s the past 3 months - last intervention was June or July at [facility].- watch closely 3. Hypertension/volume  - net UF 5L 11/24 and 4571 11/25 with post wt 86.3; BP still high - titrate EDW; on hydralazine 100 bid 4. Anemia  - hgb 10 - had been on Mircera - last dose was 50 on 11/7 - it was to have been increased to 75 but he hadn't been to dialysis to get it.  On weekly Fe. Aranesp 60 given 11/24- due again on Thursday 5. Metabolic bone disease -  Continue hectorol/binders - 3 phoslo AC 6. Nutrition - renal carb mod/vits           7.   DM - per primary   Myriam Jacobson, PA-C [place] [phone] 02/02/2016,10:11 AM  LOS: 4 days   Pt seen, examined and agree w A/P as above.  Kelly Splinter MD [facility] pager [phone]   02/02/2016, 12:29 PM    Subjective:   No c/o  Objective Vitals:   02/01/16 1500 02/01/16 2106 02/02/16 0529 02/02/16 0928  BP: (!) 143/88 (!) 171/86 (!) 157/80 (!) 168/85  Pulse: 94 89 83 88  Resp: 14 18 17 18   Temp: 97.9 F (36.6 C) 97.9 F (36.6 C) 97.7 F (36.5 C) 98.7 F (37.1 C)  TempSrc: Axillary Axillary Axillary Axillary  SpO2: 100% 99% 99% 99%  Weight:  88.2 kg (194 lb 6.4 oz)    Height:  5\' 11"  (1.803 m)     Physical Exam General: NAD - sitter at bedside Heart: RRR Lungs: no rales Abdomen: soft NT Extremities:  no edema LE Dialysis Access: left upper AVGG + bruit with min swelling of left arm/hand compared to right   Additional Objective Labs: Basic Metabolic Panel:  Recent Labs Lab 01/29/16 0544 01/29/16 1430  01/30/16 0055 01/30/16 0806 02/01/16 0413  NA 138  --   --  127*  --  131*  K 4.7  --   --  4.1  --  4.8  CL 110  --   --  95*  --  93*  CO2 18*  --   --  23  --  23  GLUCOSE 157*  --   < > 715* 704* 394*  BUN 69*  --   --  42*  --  56*  CREATININE 9.41*  --   --  7.04*  --  8.93*  CALCIUM 8.2*  --   --  7.3*  --  9.0  PHOS  --  3.0  --   --   --  6.3*  < > = values in this interval not displayed. Liver Function Tests:  Recent Labs Lab 01/27/16 2343 01/28/16 2113 01/29/16 0544 02/01/16 0413  AST 71* 41 27  --   ALT 69* 56 45  --  ALKPHOS 84 81 76  --   BILITOT 0.7 0.7 1.0  --   PROT 7.1 7.3 6.4*  --   ALBUMIN 3.6 3.6 3.1* 3.1*    Recent Labs Lab 01/27/16 2343  LIPASE 72*   CBC:  Recent Labs Lab 01/27/16 2343 01/28/16 2113 01/29/16 0544  WBC 5.4 6.7 5.7  NEUTROABS  --  5.5  --   HGB 10.0* 10.5* 10.0*  HCT 31.1* 32.0* 30.7*  MCV 90.7 90.1 90.0  PLT 248 254 245   Blood Culture No results found for: SDES, SPECREQUEST, CULT, REPTSTATUS  Cardiac Enzymes:  Recent Labs Lab 01/28/16 2113  CKTOTAL 449*   CBG:  Recent Labs Lab 02/01/16 1210 02/01/16 1336 02/01/16 1725 02/01/16 2104 02/02/16 0800  GLUCAP 438* 344* 111* 213* 137*   Iron Studies: No results for input(s): IRON, TIBC, TRANSFERRIN, FERRITIN in the last 72 hours. Lab Results  Component Value Date   INR 0.96 03/03/2015   Studies/Results: No results found. Medications:  . calcium acetate  2,668 mg Oral TID WC  . [START ON 02/04/2016] darbepoetin (ARANESP) injection - DIALYSIS  60 mcg Intravenous Q Thu-HD  . doxercalciferol  8 mcg Intravenous Q T,Th,Sa-HD  . feeding supplement (NEPRO CARB STEADY)  237 mL Oral BID BM  . ferric gluconate (FERRLECIT/NULECIT) IV  62.5 mg  Intravenous Q Sat-HD  . FLUoxetine  20 mg Oral Daily  . heparin  5,000 Units Subcutaneous Q8H  . hydrALAZINE  100 mg Oral QHS  . Influenza vac split quadrivalent PF  0.5 mL Intramuscular Tomorrow-1000  . insulin aspart  0-5 Units Subcutaneous QHS  . insulin aspart  0-9 Units Subcutaneous TID WC  . insulin aspart  4 Units Subcutaneous TID WC  . insulin NPH Human  20 Units Subcutaneous BID AC & HS  . multivitamin  1 tablet Oral QHS

## 2016-02-02 NOTE — Progress Notes (Signed)
Inpatient Diabetes Program Recommendations  AACE/ADA: New Consensus Statement on Inpatient Glycemic Control (2015)  Target Ranges:  Prepandial:   less than 140 mg/dL      Peak postprandial:   less than 180 mg/dL (1-2 hours)      Critically ill patients:  140 - 180 mg/dL   Lab Results  Component Value Date   GLUCAP 137 (H) 02/02/2016   HGBA1C 9.1 01/08/2016    Review of Glycemic Control  Results for GERHART, RUGGIERI (MRN [id number]) as of 02/02/2016 11:22  Ref. Range 02/01/2016 12:10 02/01/2016 13:36 02/01/2016 17:25 02/01/2016 21:04 02/02/2016 08:00  Glucose-Capillary Latest Ref Range: 65 - 99 mg/dL 438 (H) 344 (H) 111 (H) 213 (H) 137 (H)    Diabetes history:Type 1 diabetes Outpatient Diabetes medications: NPH 40 units q AM, Novolin R-2-15 units tid with meals  Current orders for Inpatient glycemic control: Novolog sensitive tid with meals, Novolog 0-9 units qhs, NPH 20 units bid, Novolog 4 units tid  Inpatient Diabetes Program Recommendations: Agree with current orders for blood sugar management.  Gentry Fitz, RN, BA, MHA, CDE Diabetes Coordinator Inpatient Diabetes Program  [phone] (Team Pager) [phone] ([place]) 02/02/2016 11:29 AM

## 2016-02-03 DIAGNOSIS — N186 End stage renal disease: Secondary | ICD-10-CM

## 2016-02-03 DIAGNOSIS — Z992 Dependence on renal dialysis: Secondary | ICD-10-CM

## 2016-02-03 DIAGNOSIS — T6592XD Toxic effect of unspecified substance, intentional self-harm, subsequent encounter: Secondary | ICD-10-CM

## 2016-02-03 LAB — CBC
HCT: 36.4 % — ABNORMAL LOW (ref 39.0–52.0)
HEMOGLOBIN: 11.7 g/dL — AB (ref 13.0–17.0)
MCH: 28.7 pg (ref 26.0–34.0)
MCHC: 32.1 g/dL (ref 30.0–36.0)
MCV: 89.2 fL (ref 78.0–100.0)
PLATELETS: 305 10*3/uL (ref 150–400)
RBC: 4.08 MIL/uL — AB (ref 4.22–5.81)
RDW: 13.4 % (ref 11.5–15.5)
WBC: 4.7 10*3/uL (ref 4.0–10.5)

## 2016-02-03 LAB — RENAL FUNCTION PANEL
ANION GAP: 13 (ref 5–15)
Albumin: 3.5 g/dL (ref 3.5–5.0)
BUN: 30 mg/dL — ABNORMAL HIGH (ref 6–20)
CALCIUM: 9.2 mg/dL (ref 8.9–10.3)
CHLORIDE: 98 mmol/L — AB (ref 101–111)
CO2: 27 mmol/L (ref 22–32)
CREATININE: 7.62 mg/dL — AB (ref 0.61–1.24)
GFR, EST AFRICAN AMERICAN: 10 mL/min — AB (ref >60)
GFR, EST NON AFRICAN AMERICAN: 8 mL/min — AB (ref >60)
Glucose, Bld: 88 mg/dL (ref 65–99)
Phosphorus: 5.9 mg/dL — ABNORMAL HIGH (ref 2.5–4.6)
Potassium: 3.5 mmol/L (ref 3.5–5.1)
Sodium: 138 mmol/L (ref 135–145)

## 2016-02-03 LAB — GLUCOSE, CAPILLARY
GLUCOSE-CAPILLARY: 84 mg/dL (ref 65–99)
GLUCOSE-CAPILLARY: 192 mg/dL — AB (ref 65–99)
GLUCOSE-CAPILLARY: 67 mg/dL (ref 65–99)
GLUCOSE-CAPILLARY: 67 mg/dL (ref 65–99)
Glucose-Capillary: 128 mg/dL — ABNORMAL HIGH (ref 65–99)

## 2016-02-03 MED ORDER — FLUOXETINE HCL 20 MG PO CAPS: 20.0000 mg | ORAL_CAPSULE | Freq: Every day | ORAL | 0 refills | Status: DC

## 2016-02-03 MED ORDER — HYDROXYZINE HCL 25 MG PO TABS: 25.0000 mg | ORAL_TABLET | Freq: Every evening | ORAL | 0 refills | Status: DC | PRN

## 2016-02-03 MED ORDER — INSULIN NPH (HUMAN) (ISOPHANE) 100 UNIT/ML ~~LOC~~ SUSP: 30.0000 [IU] | SUBCUTANEOUS | Status: DC

## 2016-02-03 NOTE — Progress Notes (Signed)
Spoke with SW Crawford Givens who requested DD to call Dr Latina Craver to determine if patient could be reevaluated in order to discuss discharge plan/disposition. Paged and spoke with Dr. Latina Craver. [employer] spoke with Dr. Latina Craver to discuss disposition, and possibility of patient receiving outpatient treatment at discharge. Amron Guerrette, [place]

## 2016-02-03 NOTE — Progress Notes (Signed)
[facility] Progress Note   Dialysis Orders: TTS AF 4.25 hours 500/800 2 K 2 Ca left upper  EDW  87 AVGG heparin 10K with 2000 mid tmt Hectorol 8 Mircera 75 q 2 weeks, venofer 50/week Recent labs: hgb 9.8 11/16 34% sat iPTH 571   Assessment/Plan: 1.  Suicide attempt with insulin overdose/involuntary commitment/depressionand hypoglycemia - per primary; psych and psych SW seeing -  2. ESRD-  TTS - for HD  Thursday  Access arm swelling significantly improved with removal of volume but the fact that it was much more swollen than right arm is worrisome.- Access flows have been in the 1100-1200 s the past 3 months - last intervention was June or July at [facility].- watch closely 3. Hypertension/volume- net UF 5L 11/24 and 4571 11/25 with post wt 86.3; 3 L  11/28 with post wt 84.4 titrating EDW down -will have lower EDW at discharge; BP some better; lower to 84 for Wed tmt - on hydralazine 100 bid 4. Anemia- hgb 11.7 - had been on Mircera - last dose was 50 on 11/7 - it was to have been increased to 75 but he hadn't been to dialysis to get it. On weekly Fe. Aranesp 60 given 11/2; hold Aranesp for now - next dose due Saturday 5. Metabolic bone disease- Continue hectorol/binders - 3 phoslo AC 6. Nutrition- renal carb mod/vits                     7. DM -per primary   Myriam Jacobson, PA-C [place] [phone] 02/03/2016,8:27 AM  LOS: 5 days   Pt seen, examined and agree w A/P as above.  Kelly Splinter MD [facility] pager [phone]   02/03/2016, 2:15 PM    Subjective:   No problems with dialysis yesterday.  Waiting on the plan  Objective Vitals:   02/02/16 1545 02/02/16 1645 02/02/16 2205 02/03/16 0540  BP: (!) 143/78 133/83 (!) 154/89 131/72  Pulse: 90 96 92 78  Resp:  16 18 16   Temp: 97.9 F (36.6 C) 98.4 F (36.9 C) 98 F (36.7 C) 97.5 F (36.4 C)  TempSrc: Oral Oral Axillary Axillary  SpO2: 98% 100% 99% 99%  Weight:  84.4 kg (186 lb 1.1 oz)  84.8 kg (186 lb 15.2 oz)   Height:       Physical Exam General: NAD pleasant Heart: RRR Lungs: no rales Abdomen: soft NT Extremities: no LE edema Dialysis Access: left upper AVGG + bruit   Additional Objective Labs: Basic Metabolic Panel:  Recent Labs Lab 01/29/16 1430  01/30/16 0055 01/30/16 0806 02/01/16 0413 02/02/16 1146  NA  --   --  127*  --  131* 136  K  --   --  4.1  --  4.8 4.2  CL  --   --  95*  --  93* 97*  CO2  --   --  23  --  23 27  GLUCOSE  --   < > 715* 704* 394* 284*  BUN  --   --  42*  --  56* 65*  CREATININE  --   --  7.04*  --  8.93* 9.78*  CALCIUM  --   --  7.3*  --  9.0 9.4  PHOS 3.0  --   --   --  6.3* 4.2  < > = values in this interval not displayed. Liver Function Tests:  Recent Labs Lab 01/27/16 2343 01/28/16 2113 01/29/16 0544  02/01/16 0413 02/02/16 1146  AST 71* 41 27  --   --   ALT 69* 56 45  --   --   ALKPHOS 84 81 76  --   --   BILITOT 0.7 0.7 1.0  --   --   PROT 7.1 7.3 6.4*  --   --   ALBUMIN 3.6 3.6 3.1* 3.1* 3.1*    Recent Labs Lab 01/27/16 2343  LIPASE 72*   CBC:  Recent Labs Lab 01/27/16 2343 01/28/16 2113 01/29/16 0544 02/02/16 1147 02/03/16 0624  WBC 5.4 6.7 5.7 5.2 4.7  NEUTROABS  --  5.5  --   --   --   HGB 10.0* 10.5* 10.0* 10.6* 11.7*  HCT 31.1* 32.0* 30.7* 32.2* 36.4*  MCV 90.7 90.1 90.0 88.2 89.2  PLT 248 254 245 307 305   Blood Culture No results found for: SDES, SPECREQUEST, CULT, REPTSTATUS  Cardiac Enzymes:  Recent Labs Lab 01/28/16 2113  CKTOTAL 449*   CBG:  Recent Labs Lab 02/02/16 0800 02/02/16 1139 02/02/16 1638 02/02/16 2206 02/03/16 0755  GLUCAP 137* 261* 174* 223* 84   Iron Studies: No results for input(s): IRON, TIBC, TRANSFERRIN, FERRITIN in the last 72 hours. Lab Results  Component Value Date   INR 0.96 03/03/2015   Studies/Results: No results found. Medications:  . calcium acetate  2,668 mg Oral TID WC  . [START ON 02/06/2016]  darbepoetin (ARANESP) injection - DIALYSIS  60 mcg Intravenous Q Sat-HD  . doxercalciferol  8 mcg Intravenous Q T,Th,Sa-HD  . feeding supplement (NEPRO CARB STEADY)  237 mL Oral BID BM  . ferric gluconate (FERRLECIT/NULECIT) IV  62.5 mg Intravenous Q Sat-HD  . FLUoxetine  20 mg Oral Daily  . heparin  5,000 Units Subcutaneous Q8H  . hydrALAZINE  100 mg Oral QHS  . Influenza vac split quadrivalent PF  0.5 mL Intramuscular Tomorrow-1000  . insulin aspart  0-5 Units Subcutaneous QHS  . insulin aspart  0-9 Units Subcutaneous TID WC  . insulin aspart  4 Units Subcutaneous TID WC  . insulin NPH Human  20 Units Subcutaneous BID AC & HS  . multivitamin  1 tablet Oral QHS

## 2016-02-03 NOTE — Progress Notes (Signed)
Per charge nurse, notify security prior to patients discharge. MD notified with this information. No further orders obtained.

## 2016-02-03 NOTE — Progress Notes (Signed)
Evlyn Clines to be D/C'd Home per MD order.  Discussed prescriptions and follow up appointments with the patient. Prescriptions given to patient, medication list explained in detail. Pt verbalized understanding.    Medication List    STOP taking these medications   insulin lispro 100 UNIT/ML injection Commonly known as:  HUMALOG     TAKE these medications   calcium acetate 667 MG capsule Commonly known as:  PHOSLO Take 1 capsule (667 mg total) by mouth 3 (three) times daily with meals. What changed:  how much to take  when to take this  additional instructions   FLUoxetine 20 MG capsule Commonly known as:  PROZAC Take 1 capsule (20 mg total) by mouth daily. Start taking on:  02/04/2016   glucose blood test strip Commonly known as:  ONETOUCH VERIO 1 each by Other route 4 (four) times daily. And lancets 4/day DX CODE E.11.10   hydrALAZINE 100 MG tablet Commonly known as:  APRESOLINE Take 100 mg by mouth at bedtime.   hydrOXYzine 25 MG tablet Commonly known as:  ATARAX/VISTARIL Take 1 tablet (25 mg total) by mouth at bedtime as needed and may repeat dose one time if needed for anxiety (insomnia.).   insulin NPH Human 100 UNIT/ML injection Commonly known as:  HUMULIN N Inject 0.3 mLs (30 Units total) into the skin every morning. What changed:  how much to take   insulin regular 250 units/2.69mL (100 units/mL) injection Commonly known as:  NOVOLIN R,HUMULIN R Inject 2-15 Units into the skin See admin instructions. Inject 2-15 units subcutaneously up to 6 times daily when CBG meter reads "high"   multivitamin Tabs tablet Take 1 tablet by mouth at bedtime. What changed:  when to take this   [facility] DELICA LANCETS 25Q Misc Use check blood sugar 4 times per day. Dx code: E11.9       Vitals:   02/03/16 1139 02/03/16 1733  BP: (!) 143/100 132/75  Pulse: 93 (!) 111  Resp: 18 18  Temp: 98.4 F (36.9 C) 98.4 F (36.9 C)    Skin clean, dry and intact  without evidence of skin break down, no evidence of skin tears noted. IV catheter discontinued intact. Site without signs and symptoms of complications. Dressing and pressure applied. Pt denies pain at this time. No complaints noted.  An After Visit Summary was printed and given to the patient. Patient escorted via ambulation, and D/C home via private auto.  Dixie Dials RN, BSN

## 2016-02-03 NOTE — Consult Note (Signed)
[name]   Reason for Consult:  Intentional drug (insulin) overdose and SIB Referring Physician:  Dr. Thereasa Solo Patient Identification: Edward Colon MRN:  [id number] Principal Diagnosis: Suicide attempt by substance overdose [facility]) Diagnosis:   Patient Active Problem List   Diagnosis Date Noted  . Involuntary commitment [Z04.6] 01/29/2016  . Suicide attempt by substance overdose (Edward Colon) [T65.92XA] 01/28/2016  . ESRD on dialysis (Edward Colon) [N18.6, Z99.2] 03/07/2015  . Hyperglycemia [R73.9] 03/06/2015  . Acute on chronic renal failure (Edward Colon) [N17.9, N18.9] 02/27/2015  . URI (upper respiratory infection) [J06.9] 02/27/2015  . Acute-on-chronic kidney injury (Edward Colon) [N17.9, N18.9] 02/27/2015  . Rhabdomyolysis [M62.82] 08/31/2014  . Hypoglycemia [E16.2] 08/30/2014  . Type 1 diabetes (Edward Colon) [E10.9] 08/30/2014  . CKD stage 2 due to type 2 diabetes mellitus (Edward Colon) [Y69.48, N18.2] 08/30/2014  . Anemia [D64.9] 08/30/2014  . Swelling of arm [M79.89] 05/31/2013  . ARF (acute renal failure) (Edward Colon) [N17.9] 05/31/2013  . Hyperkalemia [E87.5] 05/31/2013  . Seizure (Edward Colon) [R56.9] 05/31/2013  . Seizures (Edward Colon) [R56.9]   . Hypertension [I10]     Total Time spent with patient: 1 hour  Subjective:   Edward Colon is a 35 y.o. male patient admitted with intentional overdose of insulin as a suicide attempt.  HPI:  Chief Complaint: Suicide attempt  HPI: Edward Colon is a 35 y.o. male seen, chart reviewed for the face to face psychiatric consultation and evaluation for depression and suicide ideation and status post intentional overdose of insulin as a suicide attempt. Patient endorses that he has been suffering with type 1 DM, challenging to control the blood sugars, ESRD with hemodialysis, misses his appointment often and also severely disappointed with his girl friend's lack of understanding his medical condition and also unhappy about her willingness to visit his  family including his parents home for thanksgiving and his vehicle been in repair stand. He was upset about he could not get the phone call in time due to phone went on silence mode when he is expecting a cab ride to his family home. He has taken full syringe of regular insulin with intent to end his life and also has scissors in his hand and thinking about slicing his head but could not do it due to his blood sugars are falling down and he could not think and act right at that time. His GF called [employer] who tased him due to scissors in his hand prior to bring him to hospital. He has no history of drug abuse or alcohol. He has been working hard with several jobs and has two 33 years old twins.  Past Psychiatric History: Patient denied history of psychiatric illness.  Interval History: Patient seen today for the psychiatric consultation follow-up as requested by Dr. Broadus John and case manager/staff nurse. Patient appeared sitting in his bed, calm and cooperative and pleasant. Patient reported he has been talking with several people about his stresses made him to overdose on his insulin and feels regrets about his past attempt. Patient feels the medication he has been taking helped him to think clear and less depressed and anxious. Patient has no suicidal ideations, intentions, or plans, gestures or behaviors. Patient has been highly cooperative with his treatment needs during this hospitalization. Patient contract for safety and consider his father and girlfriend has been his support system. Patient also reported he has no previous history of suicidal attempts or psychiatric illness. Patient is willing to participate in outpatient medication management when medically stable. Patient is also  requested privileges off his form so that he can talk to his family members.  Risk to Self: Is patient at risk for suicide?: NO Risk to Others:   Prior Inpatient Therapy:   Prior Outpatient Therapy:    Past Medical History:   Past Medical History:  Diagnosis Date  . Anemia   . Blind left eye since ~ 2010  . Depression   . Diabetic peripheral neuropathy ([place])   . ESRD (end stage renal disease) on dialysis [facility])    "TTS; [facility]; [facility]" (02/01/2016)  . Heart murmur    denies any problems with it  . Hypertension   . Seizures ([place])    "last one was end of 2016; they are related to my diabetes" (02/01/2016)  . Type 1 diabetes (Edward Colon) dx'd 1990    Past Surgical History:  Procedure Laterality Date  . AV FISTULA PLACEMENT Right 03/03/2015   Procedure: RIGHT RADIO-CEPHALIC ARTERIOVENOUS (AV) FISTULA CREATION;  Surgeon: Serafina Mitchell, MD;  Location: [facility] OR;  Service: Vascular;  Laterality: Right;  . AV FISTULA PLACEMENT Left 06/15/2015   Procedure: INSERTION OF LEFT UPPER ARM  ARTERIOVENOUS (AV) 6m x 50cm GORE-TEX GRAFT;  Surgeon: CElam Dutch MD;  [place] [facility]  Service: Vascular;  Laterality: Left;  . BASCILIC VEIN TRANSPOSITION Left 04/01/2015   Procedure: BASCILIC VEIN TRANSPOSITION-LEFT ARM- FIRST STAGE;  Surgeon: CElam Dutch MD;  [place] [facility]  Service: Vascular;  Laterality: Left;  . EYE SURGERY Right ~ 2010   for diabetic retinopathy  . INSERTION OF DIALYSIS CATHETER Right 03/03/2015   Procedure: INSERTION OF DIALYSIS CATHETER;  Surgeon: VSerafina Mitchell MD;  [facility];  Service: Vascular;  Laterality: Right;   Family History:  Family History  Problem Relation Age of Onset  . Diabetes Neg Hx    Family Psychiatric  History: Denied family history of psychiatric illness. He is oldest of seven siblings and youngest one stay with his parents.  Social History:  History  Alcohol Use  . Yes    Comment: 02/01/2016 "might have 1 drink/year"     History  Drug Use No    Social History   Social History  . Marital status: Single    Spouse name: N/A  . Number of children: N/A  . Years of education: N/A   Social History Main Topics  . Smoking status: Never Smoker  .  Smokeless tobacco: Never Used  . Alcohol use Yes     Comment: 02/01/2016 "might have 1 drink/year"  . Drug use: No  . Sexual activity: Yes   Other Topics Concern  . None   Social History Narrative  . None   Additional Social History:    Allergies:  No Known Allergies  Labs:  Results for orders placed or performed during the hospital encounter of 01/28/16 (from the past 48 hour(s))  Glucose, capillary     Status: Abnormal   Collection Time: 02/01/16  1:36 PM  Result Value Ref Range   Glucose-Capillary 344 (H) 65 - 99 mg/dL  Glucose, capillary     Status: Abnormal   Collection Time: 02/01/16  5:25 PM  Result Value Ref Range   Glucose-Capillary 111 (H) 65 - 99 mg/dL   Comment 1 Notify RN   Glucose, capillary     Status: Abnormal   Collection Time: 02/01/16  9:04 PM  Result Value Ref Range   Glucose-Capillary 213 (H) 65 - 99 mg/dL  Glucose, capillary     Status: Abnormal  Collection Time: 02/02/16  8:00 AM  Result Value Ref Range   Glucose-Capillary 137 (H) 65 - 99 mg/dL  Glucose, capillary     Status: Abnormal   Collection Time: 02/02/16 11:39 AM  Result Value Ref Range   Glucose-Capillary 261 (H) 65 - 99 mg/dL  Renal function panel     Status: Abnormal   Collection Time: 02/02/16 11:46 AM  Result Value Ref Range   Sodium 136 135 - 145 mmol/L   Potassium 4.2 3.5 - 5.1 mmol/L   Chloride 97 (L) 101 - 111 mmol/L   CO2 27 22 - 32 mmol/L   Glucose, Bld 284 (H) 65 - 99 mg/dL   BUN 65 (H) 6 - 20 mg/dL   Creatinine, Ser 9.78 (H) 0.61 - 1.24 mg/dL   Calcium 9.4 8.9 - 10.3 mg/dL   Phosphorus 4.2 2.5 - 4.6 mg/dL   Albumin 3.1 (L) 3.5 - 5.0 g/dL   GFR calc non Af Amer 6 (L) >60 mL/min   GFR calc Af Amer 7 (L) >60 mL/min    Comment: (NOTE) The eGFR has been calculated using the CKD EPI equation. This calculation has not been validated in all clinical situations. eGFR's persistently <60 mL/min signify possible Chronic Kidney Disease.    Anion gap 12 5 - 15  CBC      Status: Abnormal   Collection Time: 02/02/16 11:47 AM  Result Value Ref Range   WBC 5.2 4.0 - 10.5 K/uL   RBC 3.65 (L) 4.22 - 5.81 MIL/uL   Hemoglobin 10.6 (L) 13.0 - 17.0 g/dL   HCT 32.2 (L) 39.0 - 52.0 %   MCV 88.2 78.0 - 100.0 fL   MCH 29.0 26.0 - 34.0 pg   MCHC 32.9 30.0 - 36.0 g/dL   RDW 13.4 11.5 - 15.5 %   Platelets 307 150 - 400 K/uL  Glucose, capillary     Status: Abnormal   Collection Time: 02/02/16  4:38 PM  Result Value Ref Range   Glucose-Capillary 174 (H) 65 - 99 mg/dL  Glucose, capillary     Status: Abnormal   Collection Time: 02/02/16 10:06 PM  Result Value Ref Range   Glucose-Capillary 223 (H) 65 - 99 mg/dL  Renal function panel     Status: Abnormal   Collection Time: 02/03/16  6:24 AM  Result Value Ref Range   Sodium 138 135 - 145 mmol/L   Potassium 3.5 3.5 - 5.1 mmol/L   Chloride 98 (L) 101 - 111 mmol/L   CO2 27 22 - 32 mmol/L   Glucose, Bld 88 65 - 99 mg/dL   BUN 30 (H) 6 - 20 mg/dL   Creatinine, Ser 7.62 (H) 0.61 - 1.24 mg/dL   Calcium 9.2 8.9 - 10.3 mg/dL   Phosphorus 5.9 (H) 2.5 - 4.6 mg/dL   Albumin 3.5 3.5 - 5.0 g/dL   GFR calc non Af Amer 8 (L) >60 mL/min   GFR calc Af Amer 10 (L) >60 mL/min    Comment: (NOTE) The eGFR has been calculated using the CKD EPI equation. This calculation has not been validated in all clinical situations. eGFR's persistently <60 mL/min signify possible Chronic Kidney Disease.    Anion gap 13 5 - 15  CBC     Status: Abnormal   Collection Time: 02/03/16  6:24 AM  Result Value Ref Range   WBC 4.7 4.0 - 10.5 K/uL   RBC 4.08 (L) 4.22 - 5.81 MIL/uL   Hemoglobin 11.7 (L) 13.0 - 17.0 g/dL  HCT 36.4 (L) 39.0 - 52.0 %   MCV 89.2 78.0 - 100.0 fL   MCH 28.7 26.0 - 34.0 pg   MCHC 32.1 30.0 - 36.0 g/dL   RDW 13.4 11.5 - 15.5 %   Platelets 305 150 - 400 K/uL  Glucose, capillary     Status: None   Collection Time: 02/03/16  7:55 AM  Result Value Ref Range   Glucose-Capillary 84 65 - 99 mg/dL  Glucose, capillary      Status: Abnormal   Collection Time: 02/03/16 11:37 AM  Result Value Ref Range   Glucose-Capillary 192 (H) 65 - 99 mg/dL    Current Facility-Administered Medications  Medication Dose Route Frequency Provider Last Rate Last Dose  . acetaminophen (TYLENOL) tablet 650 mg  650 mg Oral Q6H PRN Norval Morton, MD       Or  . acetaminophen (TYLENOL) suppository 650 mg  650 mg Rectal Q6H PRN Norval Morton, MD      . albuterol (PROVENTIL) (2.5 MG/3ML) 0.083% nebulizer solution 2.5 mg  2.5 mg Nebulization Q2H PRN Norval Morton, MD      . calcium acetate (PHOSLO) capsule 1,334 mg  1,334 mg Oral PRN Cherene Altes, MD      . calcium acetate (PHOSLO) capsule 2,668 mg  2,668 mg Oral TID WC Cherene Altes, MD   2,668 mg at 02/03/16 3710  . dextrose 50 % solution 25 mL  25 mL Intravenous PRN Lily Kocher, MD   25 mL at 01/30/16 0739  . doxercalciferol (HECTOROL) injection 8 mcg  8 mcg Intravenous Q T,Th,Sa-HD Alric Seton, PA-C   8 mcg at 02/02/16 1526  . feeding supplement (NEPRO CARB STEADY) liquid 237 mL  237 mL Oral BID BM Cherene Altes, MD      . ferric gluconate (NULECIT) 62.5 mg in sodium chloride 0.9 % 100 mL IVPB  62.5 mg Intravenous Q Sat-HD Alric Seton, PA-C   62.5 mg at 01/30/16 1600  . FLUoxetine (PROZAC) capsule 20 mg  20 mg Oral Daily Ambrose Finland, MD   20 mg at 02/03/16 0957  . heparin injection 5,000 Units  5,000 Units Subcutaneous Q8H Norval Morton, MD   5,000 Units at 02/03/16 0605  . hydrALAZINE (APRESOLINE) injection 10 mg  10 mg Intravenous Q4H PRN Norval Morton, MD      . hydrALAZINE (APRESOLINE) tablet 100 mg  100 mg Oral QHS Rondell A Tamala Julian, MD   100 mg at 02/02/16 2221  . hydrOXYzine (ATARAX/VISTARIL) tablet 25 mg  25 mg Oral QHS PRN,MR X 1 Ambrose Finland, MD      . Influenza vac split quadrivalent PF (FLUARIX) injection 0.5 mL  0.5 mL Intramuscular Tomorrow-1000 Cherene Altes, MD      . insulin aspart (novoLOG) injection 0-5 Units  0-5  Units Subcutaneous QHS Cherene Altes, MD   2 Units at 02/02/16 2223  . insulin aspart (novoLOG) injection 0-9 Units  0-9 Units Subcutaneous TID WC Cherene Altes, MD   2 Units at 02/02/16 1752  . insulin aspart (novoLOG) injection 4 Units  4 Units Subcutaneous TID WC Cherene Altes, MD   4 Units at 02/03/16 [phone]  . insulin NPH Human (HUMULIN N,NOVOLIN N) injection 20 Units  20 Units Subcutaneous BID AC & HS Cherene Altes, MD   20 Units at 02/03/16 [phone]  . multivitamin (RENA-VIT) tablet 1 tablet  1 tablet Oral QHS Norval Morton, MD   1 tablet  at 02/02/16 2221  . ondansetron (ZOFRAN) tablet 4 mg  4 mg Oral Q6H PRN Norval Morton, MD       Or  . ondansetron (ZOFRAN) injection 4 mg  4 mg Intravenous Q6H PRN Norval Morton, MD        Musculoskeletal: Strength & Muscle Tone: within normal limits Gait & Station: normal Patient leans: N/A  Psychiatric Specialty Exam: Physical Exam    ROS   Blood pressure (!) 143/100, pulse 93, temperature 98.4 F (36.9 C), temperature source Axillary, resp. rate 18, height _0  (1.803 m), weight 84.8 kg (186 lb 15.2 oz), SpO2 100 %.Body mass index is 26.07 kg/m.  General Appearance: Casual  Eye Contact:  Good  Speech:  Clear and Coherent  Volume:  Normal  Mood:  Depressed  Affect:  Appropriate and Congruent  Thought Process:  Coherent and Goal Directed  Orientation:  Full (Time, Place, and Person)  Thought Content:  WDL and Logical  Suicidal Thoughts:  No, Verbally contracted for safety and regretful about his past incident and stated responded inappropriately with his stresses.  Homicidal Thoughts:  No  Memory:  Immediate;   Good Recent;   Fair Remote;   Fair  Judgement:  Fair  Insight:  Fair  Psychomotor Activity:  Normal  Concentration:  Concentration: Fair and Attention Span: Fair  Recall:  Good  Fund of Knowledge:  Good  Language:  Good  Akathisia:  Negative  Handed:  Right  AIMS (if indicated):     Assets:   Communication Skills Desire for Improvement Financial Resources/Insurance Housing Intimacy Leisure Time Resilience Social Support Talents/Skills Transportation Vocational/Educational  ADL's:  Intact  Cognition:  WNL  Sleep:        Treatment Plan Summary: 35 years old male presented with symptoms of depression, suicide attempt followed by multiple stresses related to chronic medical condition and relationship problems and wants to end his life. He has no previous mental health treatment or suicide attempts. Patient has been positively responded with his current medication regimen and able to communicate to the staff members, family members about the stresses and feels they are very supportive to him. Patient contract for safety.  Safety concerns: discontinue Air cabin crew as patient contract for safety and denies current suicidal/homicidal ideation, intention or plans.  Continue Fluoxetine 20 mg PO QD for depression Continue Hydroxyzine 25 mg PO Qhs and may repeat in hour PRN Appreciate psychiatric consultation and we sign off as of today Please contact [phone] or [phone] if needs further assistance  Disposition: Patient will be referred to the outpatient medication management and counseling services Supportive therapy provided about ongoing stressors.  Ambrose Finland, MD 02/03/2016 12:48 PM

## 2016-02-03 NOTE — Progress Notes (Signed)
Edward Colon  DGL:[id number] DOB: [birth year] DOA: 01/28/2016 PCP: [name], MD    Brief Narrative:  35 y.o. male with history of ESRD on HD, DM1, HTN, and anemia who presented after being picked up by the [facility] after patient had been attempting to harm himself with scissors. Patient was reportedly tazed.  Upon arrival to the ED patient was noted to have blood sugars in the 40s for which 2 A of D50 were given and subsequently a D10 drip was initiated. Patient came to admitted taking 40 units of NPH and an unknown amount of regular insulin prior to being transported to the hospital with the intent to harm himself. IVC was completed in the ED.    Subjective: No complaints, denies any suicidal thoughts at this time  Assessment & Plan:  Suicide attempt by insulin overdose and w/ scissors - IVC by ED  - sitter remains at bedside - Psychiatry has recommended inpatient treatment - psychiatry social worker following - Pt remains medically stable for discharge to inpatient psychiatric treatment  Hypoglycemia due to insulin OD/Very labile DM1 - CBG very labile - improved, continue current regimen   Essential hypertension - stable, monitor not on meds  ESRD on HD  - Nephrology following  Anemia of chronic disease - Hemoglobin stable  Seizure D/O  DVT prophylaxis: SQ heparin  Code Status: FULL CODE Family Communication: no family present at time of exam  Disposition Plan: inpatient psychiatry facility per psychiatry consultation recommendations  Consultants:  Nephrology  Psychiatry   Procedures: none  Antimicrobials:  none   Objective: Blood pressure (!) 143/100, pulse 93, temperature 98.4 F (36.9 C), temperature source Axillary, resp. rate 18, height 5\' 11"  (1.803 m), weight 84.8 kg (186 lb 15.2 oz), SpO2 100 %.  Intake/Output Summary (Last 24 hours) at 02/03/16 1417 Last data filed at 02/03/16 0900  Gross per 24 hour  Intake               480 ml  Output             3001 ml  Net            -2521 ml   Filed Weights   02/02/16 1117 02/02/16 1545 02/02/16 2205  Weight: 87.7 kg (193 lb 5.5 oz) 84.4 kg (186 lb 1.1 oz) 84.8 kg (186 lb 15.2 oz)   Examination: General: No acute respiratory distress  Lungs: Clear to auscultation bilaterally - no wheeze or crackles Cardiovascular: Regular rate and rhythm - no M  Abdomen: soft, mild lower quadrant tenderness, BS present Extremities: No significant edema bilateral lower extremities  CBC:  Recent Labs Lab 01/27/16 2343 01/28/16 2113 01/29/16 0544 02/02/16 1147 02/03/16 0624  WBC 5.4 6.7 5.7 5.2 4.7  NEUTROABS  --  5.5  --   --   --   HGB 10.0* 10.5* 10.0* 10.6* 11.7*  HCT 31.1* 32.0* 30.7* 32.2* 36.4*  MCV 90.7 90.1 90.0 88.2 89.2  PLT 248 254 245 307 518   Basic Metabolic Panel:  Recent Labs Lab 01/29/16 0544 01/29/16 1430  01/30/16 0055 01/30/16 0806 02/01/16 0413 02/02/16 1146 02/03/16 0624  NA 138  --   --  127*  --  131* 136 138  K 4.7  --   --  4.1  --  4.8 4.2 3.5  CL 110  --   --  95*  --  93* 97* 98*  CO2 18*  --   --  23  --  23 27 27   GLUCOSE 157*  --   < > 715* 704* 394* 284* 88  BUN 69*  --   --  42*  --  56* 65* 30*  CREATININE 9.41*  --   --  7.04*  --  8.93* 9.78* 7.62*  CALCIUM 8.2*  --   --  7.3*  --  9.0 9.4 9.2  PHOS  --  3.0  --   --   --  6.3* 4.2 5.9*  < > = values in this interval not displayed. GFR: Estimated Creatinine Clearance: 14.7 mL/min (by C-G formula based on SCr of 7.62 mg/dL (H)).  Liver Function Tests:  Recent Labs Lab 01/27/16 2343 01/28/16 2113 01/29/16 0544 02/01/16 0413 02/02/16 1146 02/03/16 0624  AST 71* 41 27  --   --   --   ALT 69* 56 45  --   --   --   ALKPHOS 84 81 76  --   --   --   BILITOT 0.7 0.7 1.0  --   --   --   PROT 7.1 7.3 6.4*  --   --   --   ALBUMIN 3.6 3.6 3.1* 3.1* 3.1* 3.5    Recent Labs Lab 01/27/16 2343  LIPASE 72*    Cardiac Enzymes:  Recent Labs Lab  01/28/16 2113  CKTOTAL 449*    HbA1C: Hemoglobin A1C  Date/Time Value Ref Range Status  01/08/2016 09:37 AM 9.1  Final  11/06/2015 04:15 PM 9.6  Final   Hgb A1c MFr Bld  Date/Time Value Ref Range Status  08/31/2014 04:42 AM 10.3 (H) 4.8 - 5.6 % Final    Comment:    (NOTE)         Pre-diabetes: 5.7 - 6.4         Diabetes: >6.4         Glycemic control for adults with diabetes: <7.0   07/18/2013 02:58 PM 7.9 (H) <5.7 % Final    Comment:    (NOTE)                                                                       According to the ADA Clinical Practice Recommendations for 2011, when HbA1c is used as a screening test:  >=6.5%   Diagnostic of Diabetes Mellitus           (if abnormal result is confirmed) 5.7-6.4%   Increased risk of developing Diabetes Mellitus References:Diagnosis and Classification of Diabetes Mellitus,Diabetes OHYW,7371,06(YIRSW 1):S62-S69 and Standards of Medical Care in         Diabetes - 2011,Diabetes Care,2011,34 (Suppl 1):S11-S61.    CBG:  Recent Labs Lab 02/02/16 1139 02/02/16 1638 02/02/16 2206 02/03/16 0755 02/03/16 1137  GLUCAP 261* 174* 223* 84 192*    Recent Results (from the past 240 hour(s))  MRSA PCR Screening     Status: None   Collection Time: 01/29/16 11:00 AM  Result Value Ref Range Status   MRSA by PCR NEGATIVE NEGATIVE Final    Comment:        The GeneXpert MRSA Assay (FDA approved for NASAL specimens only), is one component of a comprehensive MRSA colonization surveillance program. It is not intended to diagnose MRSA infection nor to guide  or monitor treatment for MRSA infections.      Scheduled Meds: . calcium acetate  2,668 mg Oral TID WC  . doxercalciferol  8 mcg Intravenous Q T,Th,Sa-HD  . feeding supplement (NEPRO CARB STEADY)  237 mL Oral BID BM  . ferric gluconate (FERRLECIT/NULECIT) IV  62.5 mg Intravenous Q Sat-HD  . FLUoxetine  20 mg Oral Daily  . heparin  5,000 Units Subcutaneous Q8H  . hydrALAZINE   100 mg Oral QHS  . Influenza vac split quadrivalent PF  0.5 mL Intramuscular Tomorrow-1000  . insulin aspart  0-5 Units Subcutaneous QHS  . insulin aspart  0-9 Units Subcutaneous TID WC  . insulin aspart  4 Units Subcutaneous TID WC  . insulin NPH Human  20 Units Subcutaneous BID AC & HS  . multivitamin  1 tablet Oral QHS    LOS: 5 days   Domenic Polite [phone] [facility]  If 7PM-7AM, please contact night-coverage [email or web address] Password [email or web address] 02/03/2016, 2:17 PM

## 2016-02-03 NOTE — Discharge Summary (Addendum)
Physician Discharge Summary  Edward Colon ZHG:[id number] DOB: [birth year] DOA: 01/28/2016  PCP: [name], MD  Admit date: 01/28/2016 Discharge date: 02/03/2016  Time spent: 81minutes  Recommendations for Outpatient Follow-up:  1. PCP Dr.Avbuere in 1 week 2. Outpatient psychiatric services as outlined by Psych MD and social worker   Discharge Diagnoses:  Principal Problem:   Suicide attempt by substance overdose [facility]) Active Problems:   Hypertension   Hypoglycemia   Type 1 diabetes ([place])   Anemia   ESRD (end stage renal disease) ([place])   Involuntary commitment   Discharge Condition: stable  Diet recommendation: Renal diabetic  Filed Weights   02/02/16 1117 02/02/16 1545 02/02/16 2205  Weight: 87.7 kg (193 lb 5.5 oz) 84.4 kg (186 lb 1.1 oz) 84.8 kg (186 lb 15.2 oz)    History of present illness:  35 y.o.malewith history ofESRD on HD, DM1, HTN, and anemia who presented after being picked up by the [place] after patient had been attempting to harm himself with scissors. Patient was reportedly tazed.  Upon arrival to the ED patient was noted to have blood sugars in the 40s for which 2 A of D50 were given and subsequently a D10 drip was initiated. Patient came to admitted taking 40 units of NPH and an unknown amount of regular insulin prior to being transported to the hospital with the intent to harm himself  Hospital Course:  Suicide attempt by insulin overdose and w/ scissors - Involuntarily committed by EDP - seen by Psychiatric MD Dr.Jonalaggada, initially psychiatry recommended inpatient treatment and social worker had been working on disposition. -now pt is improved, denies any suicidal ideation, re-evaluated by psychiatry today and felt to be stable for discharge with Outpatient services. -resources given by Psych Education officer, museum  -started on low dose Prozac and Hydroxyzine PRN for insomnia  Hypoglycemia due to insulin OD/Very labile  DM1 - CBGs have been very labile - improved, continue current regimen   Essential hypertension - stable, monitor not on meds  ESRD on HD  - stable, s/p HD yesterday  Anemia of chronic disease - Hemoglobin stable   Consultations:  Psychiatry  Discharge Exam: Vitals:   02/03/16 0540 02/03/16 1139  BP: 131/72 (!) 143/100  Pulse: 78 93  Resp: 16 18  Temp: 97.5 F (36.4 C) 98.4 F (36.9 C)    General: AAOx3 Cardiovascular: S1S2/RRR Respiratory: CTAB  Discharge Instructions   Discharge Instructions    Discharge instructions    Complete by:  As directed    Renal Diet   Increase activity slowly    Complete by:  As directed      Current Discharge Medication List    START taking these medications   Details  FLUoxetine (PROZAC) 20 MG capsule Take 1 capsule (20 mg total) by mouth daily. Qty: 20 capsule, Refills: 0    hydrOXYzine (ATARAX/VISTARIL) 25 MG tablet Take 1 tablet (25 mg total) by mouth at bedtime as needed and may repeat dose one time if needed for anxiety (insomnia.). Qty: 15 tablet, Refills: 0      CONTINUE these medications which have CHANGED   Details  insulin NPH Human (HUMULIN N) 100 UNIT/ML injection Inject 0.3 mLs (30 Units total) into the skin every morning.      CONTINUE these medications which have NOT CHANGED   Details  calcium acetate (PHOSLO) 667 MG capsule Take 1 capsule (667 mg total) by mouth 3 (three) times daily with meals. Qty: 90 capsule, Refills: 0  hydrALAZINE (APRESOLINE) 100 MG tablet Take 100 mg by mouth at bedtime.  Refills: 5    insulin regular (NOVOLIN R,HUMULIN R) 250 units/2.34mL (100 units/mL) injection Inject 2-15 Units into the skin See admin instructions. Inject 2-15 units subcutaneously up to 6 times daily when CBG meter reads "high"    multivitamin (RENA-VIT) TABS tablet Take 1 tablet by mouth at bedtime. Qty: 30 tablet, Refills: 0    glucose blood (ONETOUCH VERIO) test strip 1 each by Other route 4  (four) times daily. And lancets 4/day DX CODE E.11.10 Qty: 360 each, Refills: 3    ONETOUCH DELICA LANCETS 10G MISC Use check blood sugar 4 times per day. Dx code: E11.9 Qty: 120 each, Refills: 2      STOP taking these medications     insulin lispro (HUMALOG) 100 UNIT/ML injection        No Known Allergies Follow-up Information    [name], MD Follow up in 1 week(s).   Specialty:  Internal Medicine Contact information: [address] [place] [place] [address] [phone]            The results of significant diagnostics from this hospitalization (including imaging, microbiology, ancillary and laboratory) are listed below for reference.    Significant Diagnostic Studies: Dg Chest 2 View  Result Date: 01/28/2016 CLINICAL DATA:  Chronic cough and congestion. Shortness of breath. Initial encounter. EXAM: CHEST  2 VIEW COMPARISON:  Chest radiograph performed 03/03/2015 FINDINGS: The lungs are well-aerated. Vascular congestion is noted. Mildly increased interstitial markings may reflect minimal interstitial edema. There is no evidence of pleural effusion or pneumothorax. The heart is borderline normal in size. No acute osseous abnormalities are seen. IMPRESSION: Vascular congestion. Mildly increased interstitial markings may reflect minimal interstitial edema. Electronically Signed   By: Garald Balding M.D.   On: 01/28/2016 01:26   Ct Head Wo Contrast  Result Date: 01/28/2016 CLINICAL DATA:  Acute onset of altered mental status. Patient not responding to painful stimuli. Initial encounter. EXAM: CT HEAD WITHOUT CONTRAST TECHNIQUE: Contiguous axial images were obtained from the base of the skull through the vertex without intravenous contrast. COMPARISON:  CT of the head performed 05/31/2013, and MRI of the brain performed 06/03/2013 FINDINGS: Brain: No evidence of acute infarction, hemorrhage, hydrocephalus, extra-axial collection or mass lesion/mass effect. The posterior  fossa, including the cerebellum, brainstem and fourth ventricle, is within normal limits. The third and lateral ventricles, and basal ganglia are unremarkable in appearance. The cerebral hemispheres are symmetric in appearance, with normal gray-white differentiation. No mass effect or midline shift is seen. Vascular: No hyperdense vessel or unexpected calcification. Skull: There is no evidence of fracture; visualized osseous structures are unremarkable in appearance. Sinuses/Orbits: Prominent calcification is noted at the left optic globe, with abnormal increased attenuation within the left optic globe. The right orbit is grossly unremarkable. The paranasal sinuses and mastoid air cells are well-aerated. Other: No significant soft tissue abnormalities are seen. IMPRESSION: 1. No acute intracranial pathology seen on CT. 2. Prominent calcification at the left optic globe, with abnormal increased attenuation within the left optic globe. Electronically Signed   By: Garald Balding M.D.   On: 01/28/2016 20:17   Dg Chest Port 1 View  Result Date: 01/28/2016 CLINICAL DATA:  Patient was attempting to harm himself with scissors. Patient was tazed by the police department. Wound to the mid chest from tazer. Patient is now lethargic. EXAM: PORTABLE CHEST 1 VIEW COMPARISON:  01/28/2016 FINDINGS: Shallow inspiration. Normal heart size and pulmonary  vascularity. No focal airspace disease or consolidation in the lungs. No blunting of costophrenic angles. No pneumothorax. Mediastinal contours appear intact. Soft tissue attenuation over the lung bases. Mediastinal contours appear intact. No radiopaque soft tissue foreign bodies identified. IMPRESSION: No active disease. Electronically Signed   By: Lucienne Capers M.D.   On: 01/28/2016 23:01    Microbiology: Recent Results (from the past 240 hour(s))  MRSA PCR Screening     Status: None   Collection Time: 01/29/16 11:00 AM  Result Value Ref Range Status   MRSA by PCR  NEGATIVE NEGATIVE Final    Comment:        The GeneXpert MRSA Assay (FDA approved for NASAL specimens only), is one component of a comprehensive MRSA colonization surveillance program. It is not intended to diagnose MRSA infection nor to guide or monitor treatment for MRSA infections.      Labs: Basic Metabolic Panel:  Recent Labs Lab 01/29/16 0544 01/29/16 1430  01/30/16 0055 01/30/16 0806 02/01/16 0413 02/02/16 1146 02/03/16 0624  NA 138  --   --  127*  --  131* 136 138  K 4.7  --   --  4.1  --  4.8 4.2 3.5  CL 110  --   --  95*  --  93* 97* 98*  CO2 18*  --   --  23  --  23 27 27   GLUCOSE 157*  --   < > 715* 704* 394* 284* 88  BUN 69*  --   --  42*  --  56* 65* 30*  CREATININE 9.41*  --   --  7.04*  --  8.93* 9.78* 7.62*  CALCIUM 8.2*  --   --  7.3*  --  9.0 9.4 9.2  PHOS  --  3.0  --   --   --  6.3* 4.2 5.9*  < > = values in this interval not displayed. Liver Function Tests:  Recent Labs Lab 01/27/16 2343 01/28/16 2113 01/29/16 0544 02/01/16 0413 02/02/16 1146 02/03/16 0624  AST 71* 41 27  --   --   --   ALT 69* 56 45  --   --   --   ALKPHOS 84 81 76  --   --   --   BILITOT 0.7 0.7 1.0  --   --   --   PROT 7.1 7.3 6.4*  --   --   --   ALBUMIN 3.6 3.6 3.1* 3.1* 3.1* 3.5    Recent Labs Lab 01/27/16 2343  LIPASE 72*   No results for input(s): AMMONIA in the last 168 hours. CBC:  Recent Labs Lab 01/27/16 2343 01/28/16 2113 01/29/16 0544 02/02/16 1147 02/03/16 0624  WBC 5.4 6.7 5.7 5.2 4.7  NEUTROABS  --  5.5  --   --   --   HGB 10.0* 10.5* 10.0* 10.6* 11.7*  HCT 31.1* 32.0* 30.7* 32.2* 36.4*  MCV 90.7 90.1 90.0 88.2 89.2  PLT 248 254 245 307 305   Cardiac Enzymes:  Recent Labs Lab 01/28/16 2113  CKTOTAL 449*   BNP: BNP (last 3 results) No results for input(s): BNP in the last 8760 hours.  ProBNP (last 3 results) No results for input(s): PROBNP in the last 8760 hours.  CBG:  Recent Labs Lab 02/03/16 0755 02/03/16 1137  02/03/16 1614 02/03/16 1648 02/03/16 1726  GLUCAP 84 192* 67 67 128*       SignedDomenic Polite MD.  [facility] 02/03/2016, 5:31 PM

## 2016-02-03 NOTE — Progress Notes (Signed)
Hypoglycemic Event  CBG: 67  Treatment: Carb Snack Given.  Symptoms: None reported by patient or assessed.   Follow-up CBG: Time: 1726  CBG Result:128  Possible Reasons for Event: Decreased amount of snacking between meals.   Comments/MD notified: MD notified. No further orders received.      Magnus Sinning, BSN, RN

## 2016-02-04 DIAGNOSIS — E1122 Type 2 diabetes mellitus with diabetic chronic kidney disease: Secondary | ICD-10-CM | POA: Diagnosis not present

## 2016-02-04 DIAGNOSIS — N186 End stage renal disease: Secondary | ICD-10-CM | POA: Diagnosis not present

## 2016-02-04 DIAGNOSIS — Z992 Dependence on renal dialysis: Secondary | ICD-10-CM | POA: Diagnosis not present

## 2016-02-04 NOTE — Care Management (Signed)
Late Entry:  02/03/2016 10:30am Met with pt who, nurse reported had questions about his plan of care. This CM sat and talked with pt re process/search for an inpt psy unit for the pt to receive therapy due to SI and attempt. Explained the difficulty in obtaining inpt psy care due to his ESRD and HD treatments as well as his diabetes. The pt was very calms and asked appropriate questions. He is concerned about returning to work and described his different jobs. The pt is regretful for having acted out when he felt stressed, he states that he has never had these feelings before and has had DM for about 20  Years and been on HD for several years. The pt reports that he works multiple jobs and is so busy most of the time that he does not have time to worry about his health issues . He reports a very supportive girlfriend, two children and a supportive father who he also works with.  The pt is willing to go to therapy and does not object to going to an inpt facility, however would prefer outpt therapy if available. He feels that he is no longer feeling so stressed and is more like himself. He states that his father and girlfriend all agreed that this act was not like him, as he is usually able to handle his work and his disease well.  This pt was discussed with the CSW, Crawford Givens, and a request for another psy eval was placed as the pt has not been seen since admission about three days ago.  Call received for pt psy, Dr Lenna Sciara and this case manager reported to him that pt is concerned about plan of care, anxious to return to work and able to discuss and plan care. He is very cooperative, verbalizes understanding of his issues and is pleased to receive therapy. He reports a good support system and a busy life style that he is content with and had never experienced the stress that participated this event.  He has plans to marry soon and seems devoted to his girlfriend and children.  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  Await psy eval and recommendations re d/c plan.

## 2016-02-06 DIAGNOSIS — E1129 Type 2 diabetes mellitus with other diabetic kidney complication: Secondary | ICD-10-CM | POA: Diagnosis not present

## 2016-02-06 DIAGNOSIS — D631 Anemia in chronic kidney disease: Secondary | ICD-10-CM | POA: Diagnosis not present

## 2016-02-06 DIAGNOSIS — N186 End stage renal disease: Secondary | ICD-10-CM | POA: Diagnosis not present

## 2016-02-06 DIAGNOSIS — N2581 Secondary hyperparathyroidism of renal origin: Secondary | ICD-10-CM | POA: Diagnosis not present

## 2016-02-06 DIAGNOSIS — D509 Iron deficiency anemia, unspecified: Secondary | ICD-10-CM | POA: Diagnosis not present

## 2016-02-06 DIAGNOSIS — E8779 Other fluid overload: Secondary | ICD-10-CM | POA: Diagnosis not present

## 2016-02-09 DIAGNOSIS — N2581 Secondary hyperparathyroidism of renal origin: Secondary | ICD-10-CM | POA: Diagnosis not present

## 2016-02-09 DIAGNOSIS — E1129 Type 2 diabetes mellitus with other diabetic kidney complication: Secondary | ICD-10-CM | POA: Diagnosis not present

## 2016-02-09 DIAGNOSIS — D509 Iron deficiency anemia, unspecified: Secondary | ICD-10-CM | POA: Diagnosis not present

## 2016-02-09 DIAGNOSIS — N186 End stage renal disease: Secondary | ICD-10-CM | POA: Diagnosis not present

## 2016-02-09 DIAGNOSIS — E8779 Other fluid overload: Secondary | ICD-10-CM | POA: Diagnosis not present

## 2016-02-09 DIAGNOSIS — D631 Anemia in chronic kidney disease: Secondary | ICD-10-CM | POA: Diagnosis not present

## 2016-02-11 DIAGNOSIS — D509 Iron deficiency anemia, unspecified: Secondary | ICD-10-CM | POA: Diagnosis not present

## 2016-02-11 DIAGNOSIS — E1129 Type 2 diabetes mellitus with other diabetic kidney complication: Secondary | ICD-10-CM | POA: Diagnosis not present

## 2016-02-11 DIAGNOSIS — E8779 Other fluid overload: Secondary | ICD-10-CM | POA: Diagnosis not present

## 2016-02-11 DIAGNOSIS — N186 End stage renal disease: Secondary | ICD-10-CM | POA: Diagnosis not present

## 2016-02-11 DIAGNOSIS — D631 Anemia in chronic kidney disease: Secondary | ICD-10-CM | POA: Diagnosis not present

## 2016-02-11 DIAGNOSIS — N2581 Secondary hyperparathyroidism of renal origin: Secondary | ICD-10-CM | POA: Diagnosis not present

## 2016-02-13 DIAGNOSIS — D509 Iron deficiency anemia, unspecified: Secondary | ICD-10-CM | POA: Diagnosis not present

## 2016-02-13 DIAGNOSIS — E8779 Other fluid overload: Secondary | ICD-10-CM | POA: Diagnosis not present

## 2016-02-13 DIAGNOSIS — N2581 Secondary hyperparathyroidism of renal origin: Secondary | ICD-10-CM | POA: Diagnosis not present

## 2016-02-13 DIAGNOSIS — D631 Anemia in chronic kidney disease: Secondary | ICD-10-CM | POA: Diagnosis not present

## 2016-02-13 DIAGNOSIS — N186 End stage renal disease: Secondary | ICD-10-CM | POA: Diagnosis not present

## 2016-02-13 DIAGNOSIS — E1129 Type 2 diabetes mellitus with other diabetic kidney complication: Secondary | ICD-10-CM | POA: Diagnosis not present

## 2016-02-16 DIAGNOSIS — D509 Iron deficiency anemia, unspecified: Secondary | ICD-10-CM | POA: Diagnosis not present

## 2016-02-16 DIAGNOSIS — N186 End stage renal disease: Secondary | ICD-10-CM | POA: Diagnosis not present

## 2016-02-16 DIAGNOSIS — E8779 Other fluid overload: Secondary | ICD-10-CM | POA: Diagnosis not present

## 2016-02-16 DIAGNOSIS — E1129 Type 2 diabetes mellitus with other diabetic kidney complication: Secondary | ICD-10-CM | POA: Diagnosis not present

## 2016-02-16 DIAGNOSIS — D631 Anemia in chronic kidney disease: Secondary | ICD-10-CM | POA: Diagnosis not present

## 2016-02-16 DIAGNOSIS — N2581 Secondary hyperparathyroidism of renal origin: Secondary | ICD-10-CM | POA: Diagnosis not present

## 2016-02-20 DIAGNOSIS — D509 Iron deficiency anemia, unspecified: Secondary | ICD-10-CM | POA: Diagnosis not present

## 2016-02-20 DIAGNOSIS — D631 Anemia in chronic kidney disease: Secondary | ICD-10-CM | POA: Diagnosis not present

## 2016-02-20 DIAGNOSIS — E8779 Other fluid overload: Secondary | ICD-10-CM | POA: Diagnosis not present

## 2016-02-20 DIAGNOSIS — E1129 Type 2 diabetes mellitus with other diabetic kidney complication: Secondary | ICD-10-CM | POA: Diagnosis not present

## 2016-02-20 DIAGNOSIS — N2581 Secondary hyperparathyroidism of renal origin: Secondary | ICD-10-CM | POA: Diagnosis not present

## 2016-02-20 DIAGNOSIS — N186 End stage renal disease: Secondary | ICD-10-CM | POA: Diagnosis not present

## 2016-02-23 DIAGNOSIS — D631 Anemia in chronic kidney disease: Secondary | ICD-10-CM | POA: Diagnosis not present

## 2016-02-23 DIAGNOSIS — E8779 Other fluid overload: Secondary | ICD-10-CM | POA: Diagnosis not present

## 2016-02-23 DIAGNOSIS — N186 End stage renal disease: Secondary | ICD-10-CM | POA: Diagnosis not present

## 2016-02-23 DIAGNOSIS — E1129 Type 2 diabetes mellitus with other diabetic kidney complication: Secondary | ICD-10-CM | POA: Diagnosis not present

## 2016-02-23 DIAGNOSIS — N2581 Secondary hyperparathyroidism of renal origin: Secondary | ICD-10-CM | POA: Diagnosis not present

## 2016-02-23 DIAGNOSIS — D509 Iron deficiency anemia, unspecified: Secondary | ICD-10-CM | POA: Diagnosis not present

## 2016-02-24 DIAGNOSIS — E1129 Type 2 diabetes mellitus with other diabetic kidney complication: Secondary | ICD-10-CM | POA: Diagnosis not present

## 2016-02-24 DIAGNOSIS — D631 Anemia in chronic kidney disease: Secondary | ICD-10-CM | POA: Diagnosis not present

## 2016-02-24 DIAGNOSIS — N2581 Secondary hyperparathyroidism of renal origin: Secondary | ICD-10-CM | POA: Diagnosis not present

## 2016-02-24 DIAGNOSIS — E8779 Other fluid overload: Secondary | ICD-10-CM | POA: Diagnosis not present

## 2016-02-24 DIAGNOSIS — D509 Iron deficiency anemia, unspecified: Secondary | ICD-10-CM | POA: Diagnosis not present

## 2016-02-24 DIAGNOSIS — N186 End stage renal disease: Secondary | ICD-10-CM | POA: Diagnosis not present

## 2016-02-27 DIAGNOSIS — D631 Anemia in chronic kidney disease: Secondary | ICD-10-CM | POA: Diagnosis not present

## 2016-02-27 DIAGNOSIS — E8779 Other fluid overload: Secondary | ICD-10-CM | POA: Diagnosis not present

## 2016-02-27 DIAGNOSIS — D509 Iron deficiency anemia, unspecified: Secondary | ICD-10-CM | POA: Diagnosis not present

## 2016-02-27 DIAGNOSIS — N2581 Secondary hyperparathyroidism of renal origin: Secondary | ICD-10-CM | POA: Diagnosis not present

## 2016-02-27 DIAGNOSIS — E1129 Type 2 diabetes mellitus with other diabetic kidney complication: Secondary | ICD-10-CM | POA: Diagnosis not present

## 2016-02-27 DIAGNOSIS — N186 End stage renal disease: Secondary | ICD-10-CM | POA: Diagnosis not present

## 2016-03-01 DIAGNOSIS — E8779 Other fluid overload: Secondary | ICD-10-CM | POA: Diagnosis not present

## 2016-03-01 DIAGNOSIS — N2581 Secondary hyperparathyroidism of renal origin: Secondary | ICD-10-CM | POA: Diagnosis not present

## 2016-03-01 DIAGNOSIS — D509 Iron deficiency anemia, unspecified: Secondary | ICD-10-CM | POA: Diagnosis not present

## 2016-03-01 DIAGNOSIS — N186 End stage renal disease: Secondary | ICD-10-CM | POA: Diagnosis not present

## 2016-03-01 DIAGNOSIS — E1129 Type 2 diabetes mellitus with other diabetic kidney complication: Secondary | ICD-10-CM | POA: Diagnosis not present

## 2016-03-01 DIAGNOSIS — D631 Anemia in chronic kidney disease: Secondary | ICD-10-CM | POA: Diagnosis not present

## 2016-03-03 DIAGNOSIS — N2581 Secondary hyperparathyroidism of renal origin: Secondary | ICD-10-CM | POA: Diagnosis not present

## 2016-03-03 DIAGNOSIS — D631 Anemia in chronic kidney disease: Secondary | ICD-10-CM | POA: Diagnosis not present

## 2016-03-03 DIAGNOSIS — E8779 Other fluid overload: Secondary | ICD-10-CM | POA: Diagnosis not present

## 2016-03-03 DIAGNOSIS — D509 Iron deficiency anemia, unspecified: Secondary | ICD-10-CM | POA: Diagnosis not present

## 2016-03-03 DIAGNOSIS — E1129 Type 2 diabetes mellitus with other diabetic kidney complication: Secondary | ICD-10-CM | POA: Diagnosis not present

## 2016-03-03 DIAGNOSIS — N186 End stage renal disease: Secondary | ICD-10-CM | POA: Diagnosis not present

## 2016-03-05 DIAGNOSIS — D509 Iron deficiency anemia, unspecified: Secondary | ICD-10-CM | POA: Diagnosis not present

## 2016-03-05 DIAGNOSIS — N2581 Secondary hyperparathyroidism of renal origin: Secondary | ICD-10-CM | POA: Diagnosis not present

## 2016-03-05 DIAGNOSIS — E8779 Other fluid overload: Secondary | ICD-10-CM | POA: Diagnosis not present

## 2016-03-05 DIAGNOSIS — D631 Anemia in chronic kidney disease: Secondary | ICD-10-CM | POA: Diagnosis not present

## 2016-03-05 DIAGNOSIS — N186 End stage renal disease: Secondary | ICD-10-CM | POA: Diagnosis not present

## 2016-03-05 DIAGNOSIS — E1129 Type 2 diabetes mellitus with other diabetic kidney complication: Secondary | ICD-10-CM | POA: Diagnosis not present

## 2016-03-06 DIAGNOSIS — N186 End stage renal disease: Secondary | ICD-10-CM | POA: Diagnosis not present

## 2016-03-06 DIAGNOSIS — E1122 Type 2 diabetes mellitus with diabetic chronic kidney disease: Secondary | ICD-10-CM | POA: Diagnosis not present

## 2016-03-06 DIAGNOSIS — Z992 Dependence on renal dialysis: Secondary | ICD-10-CM | POA: Diagnosis not present

## 2016-03-08 DIAGNOSIS — D509 Iron deficiency anemia, unspecified: Secondary | ICD-10-CM | POA: Diagnosis not present

## 2016-03-08 DIAGNOSIS — N2581 Secondary hyperparathyroidism of renal origin: Secondary | ICD-10-CM | POA: Diagnosis not present

## 2016-03-08 DIAGNOSIS — N186 End stage renal disease: Secondary | ICD-10-CM | POA: Diagnosis not present

## 2016-03-08 DIAGNOSIS — D631 Anemia in chronic kidney disease: Secondary | ICD-10-CM | POA: Diagnosis not present

## 2016-03-10 DIAGNOSIS — D631 Anemia in chronic kidney disease: Secondary | ICD-10-CM | POA: Diagnosis not present

## 2016-03-10 DIAGNOSIS — D509 Iron deficiency anemia, unspecified: Secondary | ICD-10-CM | POA: Diagnosis not present

## 2016-03-10 DIAGNOSIS — N2581 Secondary hyperparathyroidism of renal origin: Secondary | ICD-10-CM | POA: Diagnosis not present

## 2016-03-10 DIAGNOSIS — N186 End stage renal disease: Secondary | ICD-10-CM | POA: Diagnosis not present

## 2016-03-12 DIAGNOSIS — N2581 Secondary hyperparathyroidism of renal origin: Secondary | ICD-10-CM | POA: Diagnosis not present

## 2016-03-12 DIAGNOSIS — D509 Iron deficiency anemia, unspecified: Secondary | ICD-10-CM | POA: Diagnosis not present

## 2016-03-12 DIAGNOSIS — N186 End stage renal disease: Secondary | ICD-10-CM | POA: Diagnosis not present

## 2016-03-12 DIAGNOSIS — D631 Anemia in chronic kidney disease: Secondary | ICD-10-CM | POA: Diagnosis not present

## 2016-03-15 DIAGNOSIS — N2581 Secondary hyperparathyroidism of renal origin: Secondary | ICD-10-CM | POA: Diagnosis not present

## 2016-03-15 DIAGNOSIS — N186 End stage renal disease: Secondary | ICD-10-CM | POA: Diagnosis not present

## 2016-03-15 DIAGNOSIS — D509 Iron deficiency anemia, unspecified: Secondary | ICD-10-CM | POA: Diagnosis not present

## 2016-03-15 DIAGNOSIS — D631 Anemia in chronic kidney disease: Secondary | ICD-10-CM | POA: Diagnosis not present

## 2016-03-21 DIAGNOSIS — N186 End stage renal disease: Secondary | ICD-10-CM | POA: Diagnosis not present

## 2016-03-21 DIAGNOSIS — D631 Anemia in chronic kidney disease: Secondary | ICD-10-CM | POA: Diagnosis not present

## 2016-03-21 DIAGNOSIS — N2581 Secondary hyperparathyroidism of renal origin: Secondary | ICD-10-CM | POA: Diagnosis not present

## 2016-03-21 DIAGNOSIS — D509 Iron deficiency anemia, unspecified: Secondary | ICD-10-CM | POA: Diagnosis not present

## 2016-03-23 DIAGNOSIS — N2581 Secondary hyperparathyroidism of renal origin: Secondary | ICD-10-CM | POA: Diagnosis not present

## 2016-03-23 DIAGNOSIS — N186 End stage renal disease: Secondary | ICD-10-CM | POA: Diagnosis not present

## 2016-03-23 DIAGNOSIS — D509 Iron deficiency anemia, unspecified: Secondary | ICD-10-CM | POA: Diagnosis not present

## 2016-03-23 DIAGNOSIS — D631 Anemia in chronic kidney disease: Secondary | ICD-10-CM | POA: Diagnosis not present

## 2016-03-30 DIAGNOSIS — E1022 Type 1 diabetes mellitus with diabetic chronic kidney disease: Secondary | ICD-10-CM | POA: Diagnosis not present

## 2016-03-30 DIAGNOSIS — D631 Anemia in chronic kidney disease: Secondary | ICD-10-CM | POA: Diagnosis not present

## 2016-03-30 DIAGNOSIS — D509 Iron deficiency anemia, unspecified: Secondary | ICD-10-CM | POA: Diagnosis not present

## 2016-03-30 DIAGNOSIS — N186 End stage renal disease: Secondary | ICD-10-CM | POA: Diagnosis not present

## 2016-03-30 DIAGNOSIS — N2581 Secondary hyperparathyroidism of renal origin: Secondary | ICD-10-CM | POA: Diagnosis not present

## 2016-04-04 DIAGNOSIS — N2581 Secondary hyperparathyroidism of renal origin: Secondary | ICD-10-CM | POA: Diagnosis not present

## 2016-04-04 DIAGNOSIS — D509 Iron deficiency anemia, unspecified: Secondary | ICD-10-CM | POA: Diagnosis not present

## 2016-04-04 DIAGNOSIS — N186 End stage renal disease: Secondary | ICD-10-CM | POA: Diagnosis not present

## 2016-04-04 DIAGNOSIS — D631 Anemia in chronic kidney disease: Secondary | ICD-10-CM | POA: Diagnosis not present

## 2016-04-06 DIAGNOSIS — E1122 Type 2 diabetes mellitus with diabetic chronic kidney disease: Secondary | ICD-10-CM | POA: Diagnosis not present

## 2016-04-06 DIAGNOSIS — N186 End stage renal disease: Secondary | ICD-10-CM | POA: Diagnosis not present

## 2016-04-06 DIAGNOSIS — Z992 Dependence on renal dialysis: Secondary | ICD-10-CM | POA: Diagnosis not present

## 2016-04-07 DIAGNOSIS — D509 Iron deficiency anemia, unspecified: Secondary | ICD-10-CM | POA: Diagnosis not present

## 2016-04-07 DIAGNOSIS — N2581 Secondary hyperparathyroidism of renal origin: Secondary | ICD-10-CM | POA: Diagnosis not present

## 2016-04-07 DIAGNOSIS — E1129 Type 2 diabetes mellitus with other diabetic kidney complication: Secondary | ICD-10-CM | POA: Diagnosis not present

## 2016-04-07 DIAGNOSIS — D631 Anemia in chronic kidney disease: Secondary | ICD-10-CM | POA: Diagnosis not present

## 2016-04-07 DIAGNOSIS — N186 End stage renal disease: Secondary | ICD-10-CM | POA: Diagnosis not present

## 2016-04-11 ENCOUNTER — Encounter: Payer: Self-pay | Admitting: Endocrinology

## 2016-04-11 ENCOUNTER — Ambulatory Visit (INDEPENDENT_AMBULATORY_CARE_PROVIDER_SITE_OTHER): Payer: Medicare Other | Admitting: Endocrinology

## 2016-04-11 VITALS — BP 132/80 | HR 92 | Ht 71.0 in | Wt 210.0 lb

## 2016-04-11 DIAGNOSIS — Z992 Dependence on renal dialysis: Secondary | ICD-10-CM | POA: Diagnosis not present

## 2016-04-11 DIAGNOSIS — N186 End stage renal disease: Secondary | ICD-10-CM

## 2016-04-11 DIAGNOSIS — E1022 Type 1 diabetes mellitus with diabetic chronic kidney disease: Secondary | ICD-10-CM | POA: Diagnosis not present

## 2016-04-11 LAB — POCT GLYCOSYLATED HEMOGLOBIN (HGB A1C): Hemoglobin A1C: 9.7

## 2016-04-11 MED ORDER — INSULIN NPH (HUMAN) (ISOPHANE) 100 UNIT/ML ~~LOC~~ SUSP: 45.0000 [IU] | SUBCUTANEOUS | Status: DC

## 2016-04-11 NOTE — Progress Notes (Signed)
Subjective:    Patient ID: Edward Colon, male    DOB: 07-15-1980, 36 y.o.   MRN: [id number]  HPI DM type: 1 Dx'ed: 8144 Complications: retinopathy and ESRD Therapy: insulin since dx DKA: last episode was in approx 2007 Severe hypoglycemia:  last episode was in early 2017 Pancreatitis: never. Other: he takes qd insulin, after poor results with multiple daily injections; he was changed from lantus to NPH, due to daily pattern of cbg's.  he works on a food truck.    Interval history: no cbg record, but states cbg's are in general lowest in the afternoon.  It is the same on dialysis days vs other days.  He seldom has hypoglycemia, and these episodes are mild.  This happens in the afternoon.  He says he never misses the NPH.  He averages approx 2-3 total of humalog per day.   Past Medical History:  Diagnosis Date  . Anemia   . Blind left eye since ~ 2010  . Depression   . Diabetic peripheral neuropathy ([place])   . ESRD (end stage renal disease) on dialysis [facility])    "TTS; [facility]; [facility]" (02/01/2016)  . Heart murmur    denies any problems with it  . Hypertension   . Seizures ([place])    "last one was end of 2016; they are related to my diabetes" (02/01/2016)  . Type 1 diabetes ([place]) dx'd 1990    Past Surgical History:  Procedure Laterality Date  . AV FISTULA PLACEMENT Right 03/03/2015   Procedure: RIGHT RADIO-CEPHALIC ARTERIOVENOUS (AV) FISTULA CREATION;  Surgeon: Serafina Mitchell, MD;  Location: [facility] OR;  Service: Vascular;  Laterality: Right;  . AV FISTULA PLACEMENT Left 06/15/2015   Procedure: INSERTION OF LEFT UPPER ARM  ARTERIOVENOUS (AV) 13mm x 50cm GORE-TEX GRAFT;  Surgeon: Elam Dutch, MD;  Location: [place];  Service: Vascular;  Laterality: Left;  . BASCILIC VEIN TRANSPOSITION Left 04/01/2015   Procedure: BASCILIC VEIN TRANSPOSITION-LEFT ARM- FIRST STAGE;  Surgeon: Elam Dutch, MD;  Location: [place];  Service: Vascular;  Laterality: Left;  . EYE SURGERY Right ~  2010   for diabetic retinopathy  . INSERTION OF DIALYSIS CATHETER Right 03/03/2015   Procedure: INSERTION OF DIALYSIS CATHETER;  Surgeon: Serafina Mitchell, MD;  Location: [facility] OR;  Service: Vascular;  Laterality: Right;    Social History   Social History  . Marital status: Single    Spouse name: N/A  . Number of children: N/A  . Years of education: N/A   Occupational History  . Not on file.   Social History Main Topics  . Smoking status: Never Smoker  . Smokeless tobacco: Never Used  . Alcohol use Yes     Comment: 02/01/2016 "might have 1 drink/year"  . Drug use: No  . Sexual activity: Yes   Other Topics Concern  . Not on file   Social History Narrative  . No narrative on file    Current Outpatient Prescriptions on File Prior to Visit  Medication Sig Dispense Refill  . calcium acetate (PHOSLO) 667 MG capsule Take 1 capsule (667 mg total) by mouth 3 (three) times daily with meals. (Patient taking differently: Take [phone] mg by mouth See admin instructions. Take 4 capsules (2668 mg) by mouth with meals and 2 capsules (1334 mg) with snacks) 90 capsule 0  . FLUoxetine (PROZAC) 20 MG capsule Take 1 capsule (20 mg total) by mouth daily. 20 capsule 0  . glucose blood (ONETOUCH VERIO) test strip  1 each by Other route 4 (four) times daily. And lancets 4/day DX CODE E.11.10 360 each 3  . hydrALAZINE (APRESOLINE) 100 MG tablet Take 100 mg by mouth at bedtime.   5  . hydrOXYzine (ATARAX/VISTARIL) 25 MG tablet Take 1 tablet (25 mg total) by mouth at bedtime as needed and may repeat dose one time if needed for anxiety (insomnia.). 15 tablet 0  . insulin regular (NOVOLIN R,HUMULIN R) 250 units/2.2mL (100 units/mL) injection Inject 2-15 Units into the skin See admin instructions. Inject 2-15 units subcutaneously up to 6 times daily when CBG meter reads "high"    . multivitamin (RENA-VIT) TABS tablet Take 1 tablet by mouth at bedtime. (Patient taking differently: Take 1 tablet by mouth  daily. ) 30 tablet 0  . ONETOUCH DELICA LANCETS 41Y MISC Use check blood sugar 4 times per day. Dx code: E11.9 120 each 2   No current facility-administered medications on file prior to visit.     No Known Allergies  Family History  Problem Relation Age of Onset  . Diabetes Neg Hx     BP 132/80   Pulse 92   Ht 5\' 11"  (1.803 m)   Wt 210 lb (95.3 kg)   SpO2 98%   BMI 29.29 kg/m    Review of Systems Denies LOC    Objective:   Physical Exam VITAL SIGNS:  See vs page GENERAL: no distress Pulses: dorsalis pedis intact bilat.   MSK: no deformity of the feet CV: no leg edema Skin:  no ulcer on the feet.  normal color and temp on the feet. Neuro: sensation is intact to touch on the feet.  A1c=9.7%     Assessment & Plan:  Type 1 DM, with retinopathy: worse ESRD: he reports he continues to need differential dosing on dialysis vs non-dialysis days.   Patient is advised the following: Patient Instructions  check your blood sugar 4 times a day: before the 3 meals, and at bedtime.  also check if you have symptoms of your blood sugar being too high or too low.  please keep a record of the readings and bring it to your next appointment here (or you can bring the meter itself).  You can write it on any piece of paper.  please call us sooner if your blood sugar goes below 70, or if you have a lot of readings over 200.   On this type of insulin schedule, you should eat meals on a regular schedule.  If a meal is missed or significantly delayed, your blood sugar could go low. Please increase the NPH to 45 units on non-dialysis days, and 35 units on dialysis days.  It is important to avoid the humalog at lunch.   Please come back for a follow-up appointment in 1 month.

## 2016-04-11 NOTE — Patient Instructions (Addendum)
check your blood sugar 4 times a day: before the 3 meals, and at bedtime.  also check if you have symptoms of your blood sugar being too high or too low.  please keep a record of the readings and bring it to your next appointment here (or you can bring the meter itself).  You can write it on any piece of paper.  please call us sooner if your blood sugar goes below 70, or if you have a lot of readings over 200.   On this type of insulin schedule, you should eat meals on a regular schedule.  If a meal is missed or significantly delayed, your blood sugar could go low. Please increase the NPH to 45 units on non-dialysis days, and 35 units on dialysis days.  It is important to avoid the humalog at lunch.   Please come back for a follow-up appointment in 1 month.

## 2016-04-14 DIAGNOSIS — N2581 Secondary hyperparathyroidism of renal origin: Secondary | ICD-10-CM | POA: Diagnosis not present

## 2016-04-14 DIAGNOSIS — D631 Anemia in chronic kidney disease: Secondary | ICD-10-CM | POA: Diagnosis not present

## 2016-04-14 DIAGNOSIS — D509 Iron deficiency anemia, unspecified: Secondary | ICD-10-CM | POA: Diagnosis not present

## 2016-04-14 DIAGNOSIS — E1129 Type 2 diabetes mellitus with other diabetic kidney complication: Secondary | ICD-10-CM | POA: Diagnosis not present

## 2016-04-14 DIAGNOSIS — N186 End stage renal disease: Secondary | ICD-10-CM | POA: Diagnosis not present

## 2016-04-15 DIAGNOSIS — D509 Iron deficiency anemia, unspecified: Secondary | ICD-10-CM | POA: Diagnosis not present

## 2016-04-15 DIAGNOSIS — N186 End stage renal disease: Secondary | ICD-10-CM | POA: Diagnosis not present

## 2016-04-15 DIAGNOSIS — D631 Anemia in chronic kidney disease: Secondary | ICD-10-CM | POA: Diagnosis not present

## 2016-04-15 DIAGNOSIS — E1129 Type 2 diabetes mellitus with other diabetic kidney complication: Secondary | ICD-10-CM | POA: Diagnosis not present

## 2016-04-15 DIAGNOSIS — N2581 Secondary hyperparathyroidism of renal origin: Secondary | ICD-10-CM | POA: Diagnosis not present

## 2016-04-19 DIAGNOSIS — D509 Iron deficiency anemia, unspecified: Secondary | ICD-10-CM | POA: Diagnosis not present

## 2016-04-19 DIAGNOSIS — D631 Anemia in chronic kidney disease: Secondary | ICD-10-CM | POA: Diagnosis not present

## 2016-04-19 DIAGNOSIS — N186 End stage renal disease: Secondary | ICD-10-CM | POA: Diagnosis not present

## 2016-04-19 DIAGNOSIS — E1129 Type 2 diabetes mellitus with other diabetic kidney complication: Secondary | ICD-10-CM | POA: Diagnosis not present

## 2016-04-19 DIAGNOSIS — N2581 Secondary hyperparathyroidism of renal origin: Secondary | ICD-10-CM | POA: Diagnosis not present

## 2016-04-21 DIAGNOSIS — N186 End stage renal disease: Secondary | ICD-10-CM | POA: Diagnosis not present

## 2016-04-21 DIAGNOSIS — D631 Anemia in chronic kidney disease: Secondary | ICD-10-CM | POA: Diagnosis not present

## 2016-04-21 DIAGNOSIS — E1129 Type 2 diabetes mellitus with other diabetic kidney complication: Secondary | ICD-10-CM | POA: Diagnosis not present

## 2016-04-21 DIAGNOSIS — D509 Iron deficiency anemia, unspecified: Secondary | ICD-10-CM | POA: Diagnosis not present

## 2016-04-21 DIAGNOSIS — N2581 Secondary hyperparathyroidism of renal origin: Secondary | ICD-10-CM | POA: Diagnosis not present

## 2016-04-26 DIAGNOSIS — E1129 Type 2 diabetes mellitus with other diabetic kidney complication: Secondary | ICD-10-CM | POA: Diagnosis not present

## 2016-04-26 DIAGNOSIS — N186 End stage renal disease: Secondary | ICD-10-CM | POA: Diagnosis not present

## 2016-04-26 DIAGNOSIS — D509 Iron deficiency anemia, unspecified: Secondary | ICD-10-CM | POA: Diagnosis not present

## 2016-04-26 DIAGNOSIS — N2581 Secondary hyperparathyroidism of renal origin: Secondary | ICD-10-CM | POA: Diagnosis not present

## 2016-04-26 DIAGNOSIS — D631 Anemia in chronic kidney disease: Secondary | ICD-10-CM | POA: Diagnosis not present

## 2016-04-27 ENCOUNTER — Encounter (HOSPITAL_COMMUNITY): Payer: Self-pay | Admitting: Neurology

## 2016-04-27 ENCOUNTER — Emergency Department (HOSPITAL_COMMUNITY)
Admission: EM | Admit: 2016-04-27 | Discharge: 2016-04-27 | Disposition: A | Discharge status: Home / Self Care | Payer: Medicare Other | Attending: Emergency Medicine | Admitting: Emergency Medicine

## 2016-04-27 DIAGNOSIS — Z794 Long term (current) use of insulin: Secondary | ICD-10-CM | POA: Diagnosis not present

## 2016-04-27 DIAGNOSIS — E10649 Type 1 diabetes mellitus with hypoglycemia without coma: Secondary | ICD-10-CM | POA: Diagnosis not present

## 2016-04-27 DIAGNOSIS — E1040 Type 1 diabetes mellitus with diabetic neuropathy, unspecified: Secondary | ICD-10-CM | POA: Insufficient documentation

## 2016-04-27 DIAGNOSIS — Z992 Dependence on renal dialysis: Secondary | ICD-10-CM | POA: Insufficient documentation

## 2016-04-27 DIAGNOSIS — N186 End stage renal disease: Secondary | ICD-10-CM | POA: Diagnosis not present

## 2016-04-27 DIAGNOSIS — I120 Hypertensive chronic kidney disease with stage 5 chronic kidney disease or end stage renal disease: Secondary | ICD-10-CM | POA: Diagnosis not present

## 2016-04-27 DIAGNOSIS — Z79899 Other long term (current) drug therapy: Secondary | ICD-10-CM | POA: Diagnosis not present

## 2016-04-27 DIAGNOSIS — E1022 Type 1 diabetes mellitus with diabetic chronic kidney disease: Secondary | ICD-10-CM | POA: Diagnosis not present

## 2016-04-27 DIAGNOSIS — E162 Hypoglycemia, unspecified: Secondary | ICD-10-CM

## 2016-04-27 DIAGNOSIS — R402431 Glasgow coma scale score 3-8, in the field [EMT or ambulance]: Secondary | ICD-10-CM | POA: Diagnosis not present

## 2016-04-27 LAB — BASIC METABOLIC PANEL
Anion gap: 11 (ref 5–15)
BUN: 52 mg/dL — AB (ref 6–20)
CHLORIDE: 106 mmol/L (ref 101–111)
CO2: 24 mmol/L (ref 22–32)
CREATININE: 9.47 mg/dL — AB (ref 0.61–1.24)
Calcium: 8.3 mg/dL — ABNORMAL LOW (ref 8.9–10.3)
GFR calc Af Amer: 7 mL/min — ABNORMAL LOW (ref >60)
GFR, EST NON AFRICAN AMERICAN: 6 mL/min — AB (ref >60)
Glucose, Bld: 103 mg/dL — ABNORMAL HIGH (ref 65–99)
Potassium: 5.2 mmol/L — ABNORMAL HIGH (ref 3.5–5.1)
SODIUM: 141 mmol/L (ref 135–145)

## 2016-04-27 LAB — CBC
HCT: 31.5 % — ABNORMAL LOW (ref 39.0–52.0)
Hemoglobin: 10 g/dL — ABNORMAL LOW (ref 13.0–17.0)
MCH: 28.2 pg (ref 26.0–34.0)
MCHC: 31.7 g/dL (ref 30.0–36.0)
MCV: 89 fL (ref 78.0–100.0)
PLATELETS: 250 10*3/uL (ref 150–400)
RBC: 3.54 MIL/uL — ABNORMAL LOW (ref 4.22–5.81)
RDW: 14.1 % (ref 11.5–15.5)
WBC: 4.8 10*3/uL (ref 4.0–10.5)

## 2016-04-27 LAB — CBG MONITORING, ED
GLUCOSE-CAPILLARY: 52 mg/dL — AB (ref 65–99)
GLUCOSE-CAPILLARY: 75 mg/dL (ref 65–99)
GLUCOSE-CAPILLARY: 134 mg/dL — AB (ref 65–99)
Glucose-Capillary: 45 mg/dL — ABNORMAL LOW (ref 65–99)
Glucose-Capillary: 223 mg/dL — ABNORMAL HIGH (ref 65–99)

## 2016-04-27 NOTE — ED Notes (Signed)
Pt given 8 oz apple juice, 1 cup apple sauce. IV team at bedside.

## 2016-04-27 NOTE — ED Notes (Addendum)
RN attempt IV, unable to establish. Recheck CBG 45, given [place] sandwich, apple sauce, graham crackers, peanut butter, 8 oz OJ. Pt fiance at bedside.

## 2016-04-27 NOTE — ED Triage Notes (Signed)
Per ems- pt coming from office building where call went out for hypoglycemia, then CPR (unsure if compressions were done by bystanders) fire dept arrived CBG 18. He was combative, diaphoretic. Unable to get IV access attempt EJ. Placed left tibial IO, gave 25 g D 10, 50 mg lidocaine, 50 mg fentanyl. CBG increased to 117, 99 before pulling into ED. Pt is a x 4. Is dialysis pt, tues/thurs/sat (went yesterday). Has c-collar on.

## 2016-04-27 NOTE — Discharge Instructions (Signed)
Continue taking your home medications as prescribed. I recommend continuing to monitor her glucose levels at home, especially prior to administering insulin.  I recommend following up with your primary care provider within the next 2 days for follow-up evaluation. Please return to the Emergency Department if symptoms worsen or new onset of fever, confusion, headache, chest pain, difficulty breathing, abdominal pain, vomiting, numbness, tingling, weakness, syncope.

## 2016-04-27 NOTE — ED Notes (Signed)
Dr. Roderic Palau removed IO.

## 2016-04-27 NOTE — ED Provider Notes (Signed)
[facility] DEPT Provider Note   CSN: [id number] Arrival date & time: 04/27/16  1501     History   Chief Complaint Chief Complaint  Patient presents with  . Hypoglycemia    HPI Edward Colon is a 36 y.o. male.  HPI   Patient is a 36 year old male with history of type 1 diabetes with ESRD (dialysis on Tues/Thurs/Sat) and hypertension who presents to the ED via EMS with complaint of hypoglycemia. EMS reports that they received a call from the patient's work reporting hypoglycemia. Upon arrival patient was sitting, combative, diaphoretic. CBG 18, patient given 25 g D10. Left tibial IO placed. Patient is now alert and oriented 4 and denies any pain or complaints. Patient reports taking his usual dose of NPH insulin this morning, reports glucose was 167 prior to eating breakfast. Denies eating lunch but reports eating some candies earlier this afternoon. 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 Patient is unable to recall the event but states he just remembers waking up in an ambulance. Patient denies any recent changes in medications and reports taking his home meds as prescribed. He reports receiving dialysis treatment yesterday without any complications.  Past Medical History:  Diagnosis Date  . Anemia   . Blind left eye since ~ 2010  . Depression   . Diabetic peripheral neuropathy ([place])   . ESRD (end stage renal disease) on dialysis [facility])    "TTS; [facility]; [facility]" (02/01/2016)  . Heart murmur    denies any problems with it  . Hypertension   . Seizures ([place])    "last one was end of 2016; they are related to my diabetes" (02/01/2016)  . Type 1 diabetes ([place]) dx'd 1990    Patient Active Problem List   Diagnosis Date Noted  . Involuntary commitment 01/29/2016  . Suicide attempt by substance overdose ([place]) 01/28/2016  . ESRD (end stage renal disease) ([place])  03/07/2015  . Hyperglycemia 03/06/2015  . Acute on chronic renal failure ([place]) 02/27/2015  . URI (upper respiratory infection) 02/27/2015  . Acute-on-chronic kidney injury ([place]) 02/27/2015  . Rhabdomyolysis 08/31/2014  . Hypoglycemia 08/30/2014  . Type 1 diabetes ([place]) 08/30/2014  . CKD stage 2 due to type 2 diabetes mellitus ([place]) 08/30/2014  . Anemia 08/30/2014  . Swelling of arm 05/31/2013  . ARF (acute renal failure) ([place]) 05/31/2013  . Hyperkalemia 05/31/2013  . Seizure ([place]) 05/31/2013  . Seizures ([place])   . Hypertension     Past Surgical History:  Procedure Laterality Date  . AV FISTULA PLACEMENT Right 03/03/2015   Procedure: RIGHT RADIO-CEPHALIC ARTERIOVENOUS (AV) FISTULA CREATION;  Surgeon: Serafina Mitchell, MD;  [facility];  Service: Vascular;  Laterality: Right;  . AV FISTULA PLACEMENT Left 06/15/2015   Procedure: INSERTION OF LEFT UPPER ARM  ARTERIOVENOUS (AV) 9mm x 50cm GORE-TEX GRAFT;  Surgeon: Elam Dutch, MD;  Location: [facility];  Service: Vascular;  Laterality: Left;  . BASCILIC VEIN TRANSPOSITION Left 04/01/2015   Procedure: BASCILIC VEIN TRANSPOSITION-LEFT ARM- FIRST STAGE;  Surgeon: Elam Dutch, MD;  Location: [facility];  Service: Vascular;  Laterality: Left;  . EYE SURGERY Right ~ 2010   for diabetic retinopathy  . INSERTION OF DIALYSIS CATHETER Right 03/03/2015   Procedure: INSERTION OF DIALYSIS CATHETER;  Surgeon: Serafina Mitchell, MD;  Location: [facility];  Service: Vascular;  Laterality: Right;       Home Medications  Prior to Admission medications   Medication Sig Start Date End Date Taking? Authorizing Provider  calcium acetate (PHOSLO) 667 MG capsule Take 1 capsule (667 mg total) by mouth 3 (three) times daily with meals. Patient taking differently: Take 1,334-2,668 mg by mouth See admin instructions. Take 4 capsules (2668 mg) by mouth with meals and 2 capsules (1334 mg) with snacks 03/07/15   [name], MD  FLUoxetine (PROZAC) 20 MG capsule  Take 1 capsule (20 mg total) by mouth daily. 02/04/16   Domenic Polite, MD  glucose blood (ONETOUCH VERIO) test strip 1 each by Other route 4 (four) times daily. And lancets 4/day DX CODE E.11.10 01/12/16   Renato Shin, MD  hydrALAZINE (APRESOLINE) 100 MG tablet Take 100 mg by mouth at bedtime.  09/22/15   Historical Provider, MD  hydrOXYzine (ATARAX/VISTARIL) 25 MG tablet Take 1 tablet (25 mg total) by mouth at bedtime as needed and may repeat dose one time if needed for anxiety (insomnia.). 02/03/16   Domenic Polite, MD  insulin NPH Human (HUMULIN N) 100 UNIT/ML injection Inject 0.45 mLs (45 Units total) into the skin every morning. 04/11/16   Renato Shin, MD  insulin regular (NOVOLIN R,HUMULIN R) 250 units/2.27mL (100 units/mL) injection Inject 2-15 Units into the skin See admin instructions. Inject 2-15 units subcutaneously up to 6 times daily when CBG meter reads "high"    Historical Provider, MD  multivitamin (RENA-VIT) TABS tablet Take 1 tablet by mouth at bedtime. Patient taking differently: Take 1 tablet by mouth daily.  03/07/15   [name], MD  [facility] DELICA LANCETS 92E MISC Use check blood sugar 4 times per day. Dx code: E11.9 06/10/15   Renato Shin, MD    Family History Family History  Problem Relation Age of Onset  . Diabetes Neg Hx     Social History Social History  Substance Use Topics  . Smoking status: Never Smoker  . Smokeless tobacco: Never Used  . Alcohol use Yes     Comment: 02/01/2016 "might have 1 drink/year"     Allergies   Patient has no known allergies.   Review of Systems Review of Systems  All other systems reviewed and are negative.    Physical Exam Updated Vital Signs BP 175/95   Pulse 74   Resp 20   SpO2 99%   Physical Exam  Constitutional: He is oriented to person, place, and time. He appears well-developed and well-nourished. No distress.  HENT:  Head: Normocephalic and atraumatic. Head is without raccoon's eyes, without Battle's  sign, without abrasion, without contusion and without laceration.  Right Ear: Tympanic membrane normal. No hemotympanum.  Left Ear: Tympanic membrane normal. No hemotympanum.  Nose: Nose normal. No sinus tenderness, nasal deformity, septal deviation or nasal septal hematoma. No epistaxis.  Mouth/Throat: Uvula is midline, oropharynx is clear and moist and mucous membranes are normal. No oropharyngeal exudate, posterior oropharyngeal edema, posterior oropharyngeal erythema or tonsillar abscesses.  Eyes: Conjunctivae and EOM are normal. Pupils are equal, round, and reactive to light. Right eye exhibits no discharge. Left eye exhibits no discharge. No scleral icterus.  Neck: Normal range of motion. Neck supple.  Cardiovascular: Normal rate, regular rhythm, normal heart sounds and intact distal pulses.   Pulmonary/Chest: Effort normal and breath sounds normal. No respiratory distress. He has no wheezes. He has no rales. He exhibits no tenderness.  Abdominal: Soft. Bowel sounds are normal. He exhibits no distension and no mass. There is no tenderness. There is no rebound and no guarding.  No hernia.  Musculoskeletal: Normal range of motion. He exhibits no edema or tenderness.  No cervical, thoracic, or lumbar spine midline TTP.  Full ROM of bilateral upper and lower extremities with 5/5 strength.   2+ radial and PT pulses. Sensation grossly intact.   Neurological: He is alert and oriented to person, place, and time. He has normal strength. No cranial nerve deficit or sensory deficit. Coordination and gait normal.  Skin: Skin is warm and dry. He is not diaphoretic.  Nursing note and vitals reviewed.    ED Treatments / Results  Labs (all labs ordered are listed, but only abnormal results are displayed) Labs Reviewed  CBC - Abnormal; Notable for the following:       Result Value   RBC 3.54 (*)    Hemoglobin 10.0 (*)    HCT 31.5 (*)    All other components within normal limits  BASIC METABOLIC  PANEL - Abnormal; Notable for the following:    Potassium 5.2 (*)    Glucose, Bld 103 (*)    BUN 52 (*)    Creatinine, Ser 9.47 (*)    Calcium 8.3 (*)    GFR calc non Af Amer 6 (*)    GFR calc Af Amer 7 (*)    All other components within normal limits  CBG MONITORING, ED - Abnormal; Notable for the following:    Glucose-Capillary 52 (*)    All other components within normal limits  CBG MONITORING, ED - Abnormal; Notable for the following:    Glucose-Capillary 45 (*)    All other components within normal limits  CBG MONITORING, ED - Abnormal; Notable for the following:    Glucose-Capillary 134 (*)    All other components within normal limits  CBG MONITORING, ED - Abnormal; Notable for the following:    Glucose-Capillary 223 (*)    All other components within normal limits  CBG MONITORING, ED    EKG  EKG Interpretation None       Radiology No results found.  Procedures Procedures (including critical care time)  Medications Ordered in ED Medications - No data to display   Initial Impression / Assessment and Plan / ED Course  I have reviewed the triage vital signs and the nursing notes.  Pertinent labs & imaging results that were available during my care of the patient were reviewed by me and considered in my medical decision making (see chart for details).     Patient presents with episode of hypoglycemia. Reports taking his usual morning dose of NPH insulin and eating breakfast shortly after. 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 CBG 18, patient given 25 g D10. Patient alert and ran 4 upon arrival to the ED. Currently denies any pain or complaints. VSS. Exam unremarkable. No neuro deficits. Glucose 52, pt given fluids and food in the ED. Repeat CBG continued to steadily increase and remain stabilized s/p PO. On reevaluation patient is resting comfortably in bed and denies any pain or  complaints. Advised patient to follow up with his PCP within the next 48 hours. Discussed return precautions. Advised patient to continue taking his home medications and monitoring his glucose at home.   Final Clinical Impressions(s) / ED Diagnoses   Final diagnoses:  Hypoglycemia    New Prescriptions New Prescriptions   No medications on file     Nona Dell, PA-C 04/27/16 [place], MD 04/28/16 [phone]

## 2016-04-27 NOTE — ED Notes (Signed)
CBG 52. Patient given orange juice and graham crackers with peanut butter.

## 2016-04-29 ENCOUNTER — Telehealth: Payer: Self-pay | Admitting: Endocrinology

## 2016-04-29 ENCOUNTER — Emergency Department (HOSPITAL_COMMUNITY)
Admission: EM | Admit: 2016-04-29 | Discharge: 2016-04-29 | Disposition: A | Discharge status: Home / Self Care | Payer: Medicare Other | Attending: Emergency Medicine | Admitting: Emergency Medicine

## 2016-04-29 ENCOUNTER — Encounter (HOSPITAL_COMMUNITY): Payer: Self-pay

## 2016-04-29 DIAGNOSIS — E161 Other hypoglycemia: Secondary | ICD-10-CM | POA: Diagnosis not present

## 2016-04-29 DIAGNOSIS — E162 Hypoglycemia, unspecified: Secondary | ICD-10-CM

## 2016-04-29 DIAGNOSIS — E1042 Type 1 diabetes mellitus with diabetic polyneuropathy: Secondary | ICD-10-CM | POA: Insufficient documentation

## 2016-04-29 DIAGNOSIS — N186 End stage renal disease: Secondary | ICD-10-CM | POA: Diagnosis not present

## 2016-04-29 DIAGNOSIS — E1022 Type 1 diabetes mellitus with diabetic chronic kidney disease: Secondary | ICD-10-CM | POA: Diagnosis not present

## 2016-04-29 DIAGNOSIS — Z79899 Other long term (current) drug therapy: Secondary | ICD-10-CM | POA: Insufficient documentation

## 2016-04-29 DIAGNOSIS — I120 Hypertensive chronic kidney disease with stage 5 chronic kidney disease or end stage renal disease: Secondary | ICD-10-CM | POA: Diagnosis not present

## 2016-04-29 DIAGNOSIS — Z992 Dependence on renal dialysis: Secondary | ICD-10-CM | POA: Insufficient documentation

## 2016-04-29 DIAGNOSIS — E10649 Type 1 diabetes mellitus with hypoglycemia without coma: Secondary | ICD-10-CM | POA: Insufficient documentation

## 2016-04-29 LAB — URINALYSIS, ROUTINE W REFLEX MICROSCOPIC
BILIRUBIN URINE: NEGATIVE
GLUCOSE, UA: 150 mg/dL — AB
KETONES UR: NEGATIVE mg/dL
LEUKOCYTES UA: NEGATIVE
Nitrite: NEGATIVE
PH: 6 (ref 5.0–8.0)
Specific Gravity, Urine: 1.011 (ref 1.005–1.030)

## 2016-04-29 LAB — CBC WITH DIFFERENTIAL/PLATELET
Basophils Absolute: 0 10*3/uL (ref 0.0–0.1)
Basophils Relative: 0 %
EOS PCT: 1 %
Eosinophils Absolute: 0.1 10*3/uL (ref 0.0–0.7)
HEMATOCRIT: 34.2 % — AB (ref 39.0–52.0)
Hemoglobin: 11.1 g/dL — ABNORMAL LOW (ref 13.0–17.0)
LYMPHS PCT: 14 %
Lymphs Abs: 1 10*3/uL (ref 0.7–4.0)
MCH: 28.7 pg (ref 26.0–34.0)
MCHC: 32.5 g/dL (ref 30.0–36.0)
MCV: 88.4 fL (ref 78.0–100.0)
MONO ABS: 0.6 10*3/uL (ref 0.1–1.0)
MONOS PCT: 8 %
Neutro Abs: 5.7 10*3/uL (ref 1.7–7.7)
Neutrophils Relative %: 77 %
PLATELETS: 228 10*3/uL (ref 150–400)
RBC: 3.87 MIL/uL — ABNORMAL LOW (ref 4.22–5.81)
RDW: 14.5 % (ref 11.5–15.5)
WBC: 7.4 10*3/uL (ref 4.0–10.5)

## 2016-04-29 LAB — RAPID URINE DRUG SCREEN, HOSP PERFORMED
Amphetamines: NOT DETECTED
BARBITURATES: NOT DETECTED
BENZODIAZEPINES: NOT DETECTED
Cocaine: NOT DETECTED
Opiates: NOT DETECTED
Tetrahydrocannabinol: NOT DETECTED

## 2016-04-29 LAB — COMPREHENSIVE METABOLIC PANEL
ALK PHOS: 68 U/L (ref 38–126)
ALT: 28 U/L (ref 17–63)
AST: 29 U/L (ref 15–41)
Albumin: 3.4 g/dL — ABNORMAL LOW (ref 3.5–5.0)
Anion gap: 11 (ref 5–15)
BUN: 60 mg/dL — AB (ref 6–20)
CALCIUM: 8.5 mg/dL — AB (ref 8.9–10.3)
CO2: 18 mmol/L — ABNORMAL LOW (ref 22–32)
CREATININE: 10.23 mg/dL — AB (ref 0.61–1.24)
Chloride: 112 mmol/L — ABNORMAL HIGH (ref 101–111)
GFR, EST AFRICAN AMERICAN: 7 mL/min — AB (ref >60)
GFR, EST NON AFRICAN AMERICAN: 6 mL/min — AB (ref >60)
Glucose, Bld: 108 mg/dL — ABNORMAL HIGH (ref 65–99)
Potassium: 4.1 mmol/L (ref 3.5–5.1)
Sodium: 141 mmol/L (ref 135–145)
Total Bilirubin: 0.9 mg/dL (ref 0.3–1.2)
Total Protein: 6.9 g/dL (ref 6.5–8.1)

## 2016-04-29 LAB — CBG MONITORING, ED
GLUCOSE-CAPILLARY: 51 mg/dL — AB (ref 65–99)
Glucose-Capillary: 41 mg/dL — CL (ref 65–99)
Glucose-Capillary: 97 mg/dL (ref 65–99)
Glucose-Capillary: 20 mg/dL — CL (ref 65–99)
Glucose-Capillary: 33 mg/dL — CL (ref 65–99)
Glucose-Capillary: 195 mg/dL — ABNORMAL HIGH (ref 65–99)

## 2016-04-29 LAB — ETHANOL

## 2016-04-29 LAB — ACETAMINOPHEN LEVEL: Acetaminophen (Tylenol), Serum: <10 ug/mL — ABNORMAL LOW (ref 10–30)

## 2016-04-29 LAB — LIPASE, BLOOD: LIPASE: 31 U/L (ref 11–51)

## 2016-04-29 MED ORDER — GLUCAGON HCL RDNA (DIAGNOSTIC) 1 MG IJ SOLR
INTRAMUSCULAR | Status: AC
  Administered 2016-04-29: 1 mg
  Filled 2016-04-29: qty 1

## 2016-04-29 NOTE — ED Notes (Signed)
CBG 20.  Notified EDP that patient did not have IV access and that he has been eating skittles and swedish fish but sugar still dropping.  EDP is going to try, with ultrasound, to gain IV access.

## 2016-04-29 NOTE — ED Notes (Signed)
Pt A&O.Given Orange juice.

## 2016-04-29 NOTE — ED Triage Notes (Signed)
Pt. [place] for hypoglycemia. Pt. CBG 21 with fire and after 15 of oral glucose pt. CBG up to 40 with EMS. Pt. Given another 24 of oral glucose. Pt. CBG 40 upon arrival to ED. EMS unable to get IV access. RN attempted unsuccessfully. IV team consulted at this time. Pt. AOx4, but shaking and lethargic. Pt. Hx of IO and ultrasound guided IV access. EDP made aware of sugar.

## 2016-04-29 NOTE — ED Notes (Signed)
Diabetes coordinator contacted RN and EDP about changing NPH dosage upon d/c.

## 2016-04-29 NOTE — Discharge Instructions (Signed)
Please take 45 units of NPH on non dialysis day and 35 units of NPH on dialysis day and do not miss meal.  Follow up with Dr. Loanne Drilling next week for further care.

## 2016-04-29 NOTE — Progress Notes (Signed)
Inpatient Diabetes Program Recommendations  AACE/ADA: New Consensus Statement on Inpatient Glycemic Control (2015)  Target Ranges:  Prepandial:   less than 140 mg/dL      Peak postprandial:   less than 180 mg/dL (1-2 hours)      Critically ill patients:  140 - 180 mg/dL   Lab Results  Component Value Date   GLUCAP 33 (LL) 04/29/2016   HGBA1C 9.7 04/11/2016    Review of Glycemic Control Results for Edward Colon, Edward Colon (MRN [id number]) as of 04/29/2016 14:12  Ref. Range 04/29/2016 10:31 04/29/2016 10:53 04/29/2016 13:35 04/29/2016 14:07  Glucose-Capillary Latest Ref Range: 65 - 99 mg/dL 41 (LL) 97 20 (LL) 33 (LL)   Diabetes history: DM1 Outpatient Diabetes medications: NPH 45 units on non dialysis days & 35 units on dialysis days + Novolin R-2-15 units tid with meals  Inpatient Diabetes Program Recommendations:  Spoke with RN & ED provider to discuss patient's CBGs. Recommend call to Dr. Loanne Drilling to discuss insulin adjustment and to make aware of current hypoglycemia. Patient saw Dr. Loanne Drilling on 04/11/16 in office and non dialysis days NPH increased 45 units @ this time.  Thank you, Nani Gasser. Tehya Leath, RN, MSN, CDE Inpatient Glycemic Control Team Team Pager [phone] (8am-5pm) 04/29/2016 2:33 PM

## 2016-04-29 NOTE — ED Notes (Signed)
2x IV RN unable to get IV access. EDP made aware. Ultrasound at bedside.

## 2016-04-29 NOTE — Telephone Encounter (Signed)
PA from ER calls.  Pt is exceeding rx'ed dosage.  I advised to take NPH to 45 units on non-dialysis days, and 35 units on dialysis days.  F/u next week.

## 2016-04-29 NOTE — ED Notes (Signed)
Pt. Has eaten 2x sandwiches at this time. Pt. AOX4.

## 2016-04-29 NOTE — ED Provider Notes (Signed)
[facility] DEPT Provider Note   CSN: [id number] Arrival date & time: 04/29/16  1022     History   Chief Complaint Chief Complaint  Patient presents with  . Hypoglycemia    HPI Edward Colon is a 36 y.o. male.  HPI   36 year old male with history of type 1 diabetes, end-stage renal disease currently on Tuesday Thursday Saturday dialysis, hypertension, seizures, depression [place]. History obtained through patient who is now alert and oriented. Patient states he was at the courthouse today to pay the ticket when bystanders noticed that he was confused and weak and subsequently notify EMS. Per nursing note, patient was found to be unresponsive. Initial CBG was 21 which was performed by EMS. Patient was given 15 mg of oral glucose which improves CBG to 40. He received another 24 mg oral glucose and upon arrival to CBG was 40. Pt was found to be lethargic and shaking initially.  He is now back to baseline.  Patient reported his endocrinologist recently increase his NPH medication to 40 units on dialysis today and 45 units on nondialysis day to help bring his A1c down to a level that will allow him to be on the pancreas transplant list. This was done approximately a month ago and since then he has noticed that his blood sugar has been running low on a daily basis. He has increase his dietary intake, as well as having candy available to eat to help combat with episodes of hypoglycemia. He report having a difficult time detecting when his blood sugar is low because he usually does not have any significant symptoms. He has been compliant with his medication, he has had regular dialysis, last dialysis was yesterday. He has been staying active, lifting weights, and eating appropriately. He denies any recent sickness. Denies alcohol, smoking or recreational drug use. Currently patient has no specific complaint. Does report occasional  shortness on exertion which is chronic. Also admits that his blood pressure is chronically elevated. Occasionally has seizures relating to low blood sugar, last episode was 3 months ago. Patient still able to make urine, able to urinate 3-4 times a day. Aside from mild dizziness he denies any other complaint. Does have history of left eye blindness secondary to diabetes complication. He was seen in the ER 2 days ago for essentially the same hypoglycemic episode. He was subsequently discharge once his blood sugar was stabilized.     Past Medical History:  Diagnosis Date  . Anemia   . Blind left eye since ~ 2010  . Depression   . Diabetic peripheral neuropathy ([place])   . ESRD (end stage renal disease) on dialysis [facility])    "TTS; [facility]; [facility]" (02/01/2016)  . Heart murmur    denies any problems with it  . Hypertension   . Seizures ([place])    "last one was end of 2016; they are related to my diabetes" (02/01/2016)  . Type 1 diabetes ([place]) dx'd 1990    Patient Active Problem List   Diagnosis Date Noted  . Involuntary commitment 01/29/2016  . Suicide attempt by substance overdose ([place]) 01/28/2016  . ESRD (end stage renal disease) ([place]) 03/07/2015  . Hyperglycemia 03/06/2015  . Acute on chronic renal failure ([place]) 02/27/2015  . URI (upper respiratory infection) 02/27/2015  . Acute-on-chronic kidney injury ([place]) 02/27/2015  . Rhabdomyolysis 08/31/2014  . Hypoglycemia 08/30/2014  . Type 1 diabetes ([place]) 08/30/2014  . CKD stage 2 due to  type 2 diabetes mellitus ([place]) 08/30/2014  . Anemia 08/30/2014  . Swelling of arm 05/31/2013  . ARF (acute renal failure) ([place]) 05/31/2013  . Hyperkalemia 05/31/2013  . Seizure ([place]) 05/31/2013  . Seizures ([place])   . Hypertension     Past Surgical History:  Procedure Laterality Date  . AV FISTULA PLACEMENT Right 03/03/2015   Procedure: RIGHT RADIO-CEPHALIC ARTERIOVENOUS (AV) FISTULA CREATION;  Surgeon: Serafina Mitchell, MD;  [facility];   Service: Vascular;  Laterality: Right;  . AV FISTULA PLACEMENT Left 06/15/2015   Procedure: INSERTION OF LEFT UPPER ARM  ARTERIOVENOUS (AV) 55mm x 50cm GORE-TEX GRAFT;  Surgeon: Elam Dutch, MD;  Location: [facility];  Service: Vascular;  Laterality: Left;  . BASCILIC VEIN TRANSPOSITION Left 04/01/2015   Procedure: BASCILIC VEIN TRANSPOSITION-LEFT ARM- FIRST STAGE;  Surgeon: Elam Dutch, MD;  Location: [facility];  Service: Vascular;  Laterality: Left;  . EYE SURGERY Right ~ 2010   for diabetic retinopathy  . INSERTION OF DIALYSIS CATHETER Right 03/03/2015   Procedure: INSERTION OF DIALYSIS CATHETER;  Surgeon: Serafina Mitchell, MD;  [facility];  Service: Vascular;  Laterality: Right;       Home Medications    Prior to Admission medications   Medication Sig Start Date End Date Taking? Authorizing Provider  calcium acetate (PHOSLO) 667 MG capsule Take 1 capsule (667 mg total) by mouth 3 (three) times daily with meals. Patient taking differently: Take 1,334-2,668 mg by mouth See admin instructions. Take 4 capsules (2668 mg) by mouth with meals and 2 capsules (1334 mg) with snacks 03/07/15   [name], MD  FLUoxetine (PROZAC) 20 MG capsule Take 1 capsule (20 mg total) by mouth daily. 02/04/16   Domenic Polite, MD  glucose blood (ONETOUCH VERIO) test strip 1 each by Other route 4 (four) times daily. And lancets 4/day DX CODE E.11.10 01/12/16   Renato Shin, MD  hydrALAZINE (APRESOLINE) 100 MG tablet Take 100 mg by mouth at bedtime.  09/22/15   Historical Provider, MD  hydrOXYzine (ATARAX/VISTARIL) 25 MG tablet Take 1 tablet (25 mg total) by mouth at bedtime as needed and may repeat dose one time if needed for anxiety (insomnia.). 02/03/16   Domenic Polite, MD  insulin NPH Human (HUMULIN N) 100 UNIT/ML injection Inject 0.45 mLs (45 Units total) into the skin every morning. 04/11/16   Renato Shin, MD  insulin regular (NOVOLIN R,HUMULIN R) 250 units/2.1mL (100 units/mL) injection Inject 2-15  Units into the skin See admin instructions. Inject 2-15 units subcutaneously up to 6 times daily when CBG meter reads "high"    Historical Provider, MD  multivitamin (RENA-VIT) TABS tablet Take 1 tablet by mouth at bedtime. Patient taking differently: Take 1 tablet by mouth daily.  03/07/15   [name], MD  [facility] DELICA LANCETS 85O MISC Use check blood sugar 4 times per day. Dx code: E11.9 06/10/15   Renato Shin, MD    Family History Family History  Problem Relation Age of Onset  . Diabetes Neg Hx     Social History Social History  Substance Use Topics  . Smoking status: Never Smoker  . Smokeless tobacco: Never Used  . Alcohol use Yes     Comment: 02/01/2016 "might have 1 drink/year"     Allergies   Patient has no known allergies.   Review of Systems Review of Systems  All other systems reviewed and are negative.    Physical Exam Updated Vital Signs BP (!) 201/108 (BP Location: Right  Arm)   Pulse 92   Temp 97.6 F (36.4 C) (Oral)   Resp 13   Ht 5\' 11"  (1.803 m)   Wt 95.3 kg   SpO2 100%   BMI 29.29 kg/m   Physical Exam  Constitutional: He is oriented to person, place, and time. He appears well-developed and well-nourished. No distress.  HENT:  Head: Atraumatic.  Eyes: Conjunctivae and EOM are normal. Pupils are equal, round, and reactive to light.  Neck: Neck supple.  No nuchal rigidity  Cardiovascular: Normal rate and regular rhythm.   Pulmonary/Chest: Effort normal and breath sounds normal. He has no rales.  Abdominal: Soft. There is no tenderness.  Neurological: He is alert and oriented to person, place, and time. He has normal strength. No cranial nerve deficit or sensory deficit. GCS eye subscore is 4. GCS verbal subscore is 5. GCS motor subscore is 6.  Skin: No rash noted.  Psychiatric: He has a normal mood and affect.  Calm and cooperative.  Nursing note and vitals reviewed.    ED Treatments / Results  Labs (all labs ordered are listed,  but only abnormal results are displayed) Labs Reviewed  CBG MONITORING, ED - Abnormal; Notable for the following:       Result Value   Glucose-Capillary 41 (*)    All other components within normal limits  ACETAMINOPHEN LEVEL  COMPREHENSIVE METABOLIC PANEL  ETHANOL  LIPASE, BLOOD  CBC WITH DIFFERENTIAL/PLATELET  RAPID URINE DRUG SCREEN, HOSP PERFORMED  URINALYSIS, ROUTINE W REFLEX MICROSCOPIC  CBG MONITORING, ED    EKG  EKG Interpretation None       Radiology No results found.  Procedures Procedures (including critical care time)  Medications Ordered in ED Medications  glucagon (human recombinant) (GLUCAGEN) 1 MG injection (1 mg  Given 04/29/16 1045)     Initial Impression / Assessment and Plan / ED Course  I have reviewed the triage vital signs and the nursing notes.  Pertinent labs & imaging results that were available during my care of the patient were reviewed by me and considered in my medical decision making (see chart for details).     BP 187/100   Pulse 88   Temp 97.6 F (36.4 C) (Oral)   Resp 18   Ht 5\' 11"  (1.803 m)   Wt 95.3 kg   SpO2 100%   BMI 29.29 kg/m  The patient was noted to be hypertensive today in the emergency department. I have spoken with the patient regarding hypertension and the need for improved management. I instructed the patient to followup with the Primary care doctor within 4 days to improve the management of the patient's hypertension. I also counseled the patient regarding the signs and symptoms which would require an emergent visit to an emergency department for hypertensive urgency and/or hypertensive emergency. The patient understood the need for improved hypertensive management.   Final Clinical Impressions(s) / ED Diagnoses   Final diagnoses:  Hypoglycemia    New Prescriptions New Prescriptions   No medications on file   11:45 AM Patient is a type I diabetic who is here for evaluations of recurrent hypoglycemic  episode. This seems to be a result of recent change in his diabetic medication, and increased dose of his NPH as an attempt to lower his hemoglobin A1c. Patient initial CBG was in the 40s but has steadily improve after receiving glucagon, and oral glucose. He is now back to his baseline. He has no other concerning features or complaints. He was seen  2 days ago for essentially the same manifestation. We felt it would be appropriate for patient to resume his previous NPH dosing and follow-up closely with his endocrinologist for further management. We'll perform screening labs. Care discussed with Dr. Wilson Singer.  2:45 PM Appreciate consultation from pt's endocrinologist Dr. Loanne Drilling who recommend pt to take 45 units on non dialysis day and 35 units on dialysis day and f/u in office next week for further care.    Pt's CBG improves to 195.  He adamantly request to be discharge.  He will follow up with Dr. Loanne Drilling for further care.  Return precaution discussed.     Domenic Moras, PA-C 04/29/16 [place], MD 05/16/16 [phone]

## 2016-04-29 NOTE — ED Notes (Signed)
Another RN notified that pt. CBG back down to 20. EDP notified. Waiting for EDP to attempt another

## 2016-04-29 NOTE — ED Notes (Signed)
Giving patient orange juice, coke, and [place] sandwich to eat to increase sugar.  Patient still does not have IV access.

## 2016-04-30 DIAGNOSIS — N186 End stage renal disease: Secondary | ICD-10-CM | POA: Diagnosis not present

## 2016-04-30 DIAGNOSIS — D509 Iron deficiency anemia, unspecified: Secondary | ICD-10-CM | POA: Diagnosis not present

## 2016-04-30 DIAGNOSIS — N2581 Secondary hyperparathyroidism of renal origin: Secondary | ICD-10-CM | POA: Diagnosis not present

## 2016-04-30 DIAGNOSIS — D631 Anemia in chronic kidney disease: Secondary | ICD-10-CM | POA: Diagnosis not present

## 2016-04-30 DIAGNOSIS — E1129 Type 2 diabetes mellitus with other diabetic kidney complication: Secondary | ICD-10-CM | POA: Diagnosis not present

## 2016-05-02 DIAGNOSIS — Z79899 Other long term (current) drug therapy: Secondary | ICD-10-CM | POA: Diagnosis not present

## 2016-05-02 DIAGNOSIS — N2581 Secondary hyperparathyroidism of renal origin: Secondary | ICD-10-CM | POA: Diagnosis not present

## 2016-05-02 DIAGNOSIS — D509 Iron deficiency anemia, unspecified: Secondary | ICD-10-CM | POA: Diagnosis not present

## 2016-05-02 DIAGNOSIS — E878 Other disorders of electrolyte and fluid balance, not elsewhere classified: Secondary | ICD-10-CM | POA: Diagnosis not present

## 2016-05-02 DIAGNOSIS — N2589 Other disorders resulting from impaired renal tubular function: Secondary | ICD-10-CM | POA: Diagnosis not present

## 2016-05-02 DIAGNOSIS — E1129 Type 2 diabetes mellitus with other diabetic kidney complication: Secondary | ICD-10-CM | POA: Diagnosis not present

## 2016-05-02 DIAGNOSIS — N186 End stage renal disease: Secondary | ICD-10-CM | POA: Diagnosis not present

## 2016-05-02 DIAGNOSIS — E440 Moderate protein-calorie malnutrition: Secondary | ICD-10-CM | POA: Diagnosis not present

## 2016-05-02 DIAGNOSIS — R17 Unspecified jaundice: Secondary | ICD-10-CM | POA: Diagnosis not present

## 2016-05-02 DIAGNOSIS — D630 Anemia in neoplastic disease: Secondary | ICD-10-CM | POA: Diagnosis not present

## 2016-05-03 DIAGNOSIS — Z79899 Other long term (current) drug therapy: Secondary | ICD-10-CM | POA: Diagnosis not present

## 2016-05-03 DIAGNOSIS — N186 End stage renal disease: Secondary | ICD-10-CM | POA: Diagnosis not present

## 2016-05-03 DIAGNOSIS — D509 Iron deficiency anemia, unspecified: Secondary | ICD-10-CM | POA: Diagnosis not present

## 2016-05-03 DIAGNOSIS — E440 Moderate protein-calorie malnutrition: Secondary | ICD-10-CM | POA: Diagnosis not present

## 2016-05-03 DIAGNOSIS — N2581 Secondary hyperparathyroidism of renal origin: Secondary | ICD-10-CM | POA: Diagnosis not present

## 2016-05-03 DIAGNOSIS — R8299 Other abnormal findings in urine: Secondary | ICD-10-CM | POA: Diagnosis not present

## 2016-05-03 DIAGNOSIS — E1129 Type 2 diabetes mellitus with other diabetic kidney complication: Secondary | ICD-10-CM | POA: Diagnosis not present

## 2016-05-03 DIAGNOSIS — E784 Other hyperlipidemia: Secondary | ICD-10-CM | POA: Diagnosis not present

## 2016-05-04 DIAGNOSIS — Z992 Dependence on renal dialysis: Secondary | ICD-10-CM | POA: Diagnosis not present

## 2016-05-04 DIAGNOSIS — N186 End stage renal disease: Secondary | ICD-10-CM | POA: Diagnosis not present

## 2016-05-04 DIAGNOSIS — E1122 Type 2 diabetes mellitus with diabetic chronic kidney disease: Secondary | ICD-10-CM | POA: Diagnosis not present

## 2016-05-05 DIAGNOSIS — N186 End stage renal disease: Secondary | ICD-10-CM | POA: Diagnosis not present

## 2016-05-05 DIAGNOSIS — D631 Anemia in chronic kidney disease: Secondary | ICD-10-CM | POA: Diagnosis not present

## 2016-05-05 DIAGNOSIS — N2581 Secondary hyperparathyroidism of renal origin: Secondary | ICD-10-CM | POA: Diagnosis not present

## 2016-05-05 DIAGNOSIS — D509 Iron deficiency anemia, unspecified: Secondary | ICD-10-CM | POA: Diagnosis not present

## 2016-05-06 DIAGNOSIS — D509 Iron deficiency anemia, unspecified: Secondary | ICD-10-CM | POA: Diagnosis not present

## 2016-05-06 DIAGNOSIS — N2581 Secondary hyperparathyroidism of renal origin: Secondary | ICD-10-CM | POA: Diagnosis not present

## 2016-05-06 DIAGNOSIS — D631 Anemia in chronic kidney disease: Secondary | ICD-10-CM | POA: Diagnosis not present

## 2016-05-06 DIAGNOSIS — N186 End stage renal disease: Secondary | ICD-10-CM | POA: Diagnosis not present

## 2016-05-09 ENCOUNTER — Ambulatory Visit: Payer: Self-pay | Admitting: Endocrinology

## 2016-05-09 DIAGNOSIS — D631 Anemia in chronic kidney disease: Secondary | ICD-10-CM | POA: Diagnosis not present

## 2016-05-09 DIAGNOSIS — N2581 Secondary hyperparathyroidism of renal origin: Secondary | ICD-10-CM | POA: Diagnosis not present

## 2016-05-09 DIAGNOSIS — N186 End stage renal disease: Secondary | ICD-10-CM | POA: Diagnosis not present

## 2016-05-09 DIAGNOSIS — D509 Iron deficiency anemia, unspecified: Secondary | ICD-10-CM | POA: Diagnosis not present

## 2016-05-11 DIAGNOSIS — N186 End stage renal disease: Secondary | ICD-10-CM | POA: Diagnosis not present

## 2016-05-11 DIAGNOSIS — D631 Anemia in chronic kidney disease: Secondary | ICD-10-CM | POA: Diagnosis not present

## 2016-05-11 DIAGNOSIS — D509 Iron deficiency anemia, unspecified: Secondary | ICD-10-CM | POA: Diagnosis not present

## 2016-05-11 DIAGNOSIS — N2581 Secondary hyperparathyroidism of renal origin: Secondary | ICD-10-CM | POA: Diagnosis not present

## 2016-05-12 DIAGNOSIS — D509 Iron deficiency anemia, unspecified: Secondary | ICD-10-CM | POA: Diagnosis not present

## 2016-05-12 DIAGNOSIS — N2581 Secondary hyperparathyroidism of renal origin: Secondary | ICD-10-CM | POA: Diagnosis not present

## 2016-05-12 DIAGNOSIS — N186 End stage renal disease: Secondary | ICD-10-CM | POA: Diagnosis not present

## 2016-05-12 DIAGNOSIS — D631 Anemia in chronic kidney disease: Secondary | ICD-10-CM | POA: Diagnosis not present

## 2016-05-13 DIAGNOSIS — D509 Iron deficiency anemia, unspecified: Secondary | ICD-10-CM | POA: Diagnosis not present

## 2016-05-13 DIAGNOSIS — N186 End stage renal disease: Secondary | ICD-10-CM | POA: Diagnosis not present

## 2016-05-13 DIAGNOSIS — N2581 Secondary hyperparathyroidism of renal origin: Secondary | ICD-10-CM | POA: Diagnosis not present

## 2016-05-13 DIAGNOSIS — D631 Anemia in chronic kidney disease: Secondary | ICD-10-CM | POA: Diagnosis not present

## 2016-05-16 DIAGNOSIS — N2581 Secondary hyperparathyroidism of renal origin: Secondary | ICD-10-CM | POA: Diagnosis not present

## 2016-05-16 DIAGNOSIS — N186 End stage renal disease: Secondary | ICD-10-CM | POA: Diagnosis not present

## 2016-05-16 DIAGNOSIS — D631 Anemia in chronic kidney disease: Secondary | ICD-10-CM | POA: Diagnosis not present

## 2016-05-16 DIAGNOSIS — D509 Iron deficiency anemia, unspecified: Secondary | ICD-10-CM | POA: Diagnosis not present

## 2016-05-17 DIAGNOSIS — D509 Iron deficiency anemia, unspecified: Secondary | ICD-10-CM | POA: Diagnosis not present

## 2016-05-17 DIAGNOSIS — N2581 Secondary hyperparathyroidism of renal origin: Secondary | ICD-10-CM | POA: Diagnosis not present

## 2016-05-17 DIAGNOSIS — N186 End stage renal disease: Secondary | ICD-10-CM | POA: Diagnosis not present

## 2016-05-17 DIAGNOSIS — D631 Anemia in chronic kidney disease: Secondary | ICD-10-CM | POA: Diagnosis not present

## 2016-05-18 DIAGNOSIS — N2581 Secondary hyperparathyroidism of renal origin: Secondary | ICD-10-CM | POA: Diagnosis not present

## 2016-05-18 DIAGNOSIS — D509 Iron deficiency anemia, unspecified: Secondary | ICD-10-CM | POA: Diagnosis not present

## 2016-05-18 DIAGNOSIS — D631 Anemia in chronic kidney disease: Secondary | ICD-10-CM | POA: Diagnosis not present

## 2016-05-18 DIAGNOSIS — N186 End stage renal disease: Secondary | ICD-10-CM | POA: Diagnosis not present

## 2016-05-20 DIAGNOSIS — Z992 Dependence on renal dialysis: Secondary | ICD-10-CM | POA: Diagnosis not present

## 2016-05-20 DIAGNOSIS — D509 Iron deficiency anemia, unspecified: Secondary | ICD-10-CM | POA: Diagnosis not present

## 2016-05-20 DIAGNOSIS — N186 End stage renal disease: Secondary | ICD-10-CM | POA: Diagnosis not present

## 2016-05-20 DIAGNOSIS — N2581 Secondary hyperparathyroidism of renal origin: Secondary | ICD-10-CM | POA: Diagnosis not present

## 2016-05-20 DIAGNOSIS — D631 Anemia in chronic kidney disease: Secondary | ICD-10-CM | POA: Diagnosis not present

## 2016-05-23 DIAGNOSIS — D631 Anemia in chronic kidney disease: Secondary | ICD-10-CM | POA: Diagnosis not present

## 2016-05-23 DIAGNOSIS — E8779 Other fluid overload: Secondary | ICD-10-CM | POA: Diagnosis not present

## 2016-05-23 DIAGNOSIS — N2581 Secondary hyperparathyroidism of renal origin: Secondary | ICD-10-CM | POA: Diagnosis not present

## 2016-05-23 DIAGNOSIS — N186 End stage renal disease: Secondary | ICD-10-CM | POA: Diagnosis not present

## 2016-05-24 ENCOUNTER — Ambulatory Visit: Payer: Self-pay | Admitting: Endocrinology

## 2016-05-24 DIAGNOSIS — E8779 Other fluid overload: Secondary | ICD-10-CM | POA: Diagnosis not present

## 2016-05-24 DIAGNOSIS — D631 Anemia in chronic kidney disease: Secondary | ICD-10-CM | POA: Diagnosis not present

## 2016-05-24 DIAGNOSIS — N186 End stage renal disease: Secondary | ICD-10-CM | POA: Diagnosis not present

## 2016-05-24 DIAGNOSIS — N2581 Secondary hyperparathyroidism of renal origin: Secondary | ICD-10-CM | POA: Diagnosis not present

## 2016-05-26 DIAGNOSIS — E8779 Other fluid overload: Secondary | ICD-10-CM | POA: Diagnosis not present

## 2016-05-26 DIAGNOSIS — N2581 Secondary hyperparathyroidism of renal origin: Secondary | ICD-10-CM | POA: Diagnosis not present

## 2016-05-26 DIAGNOSIS — N186 End stage renal disease: Secondary | ICD-10-CM | POA: Diagnosis not present

## 2016-05-26 DIAGNOSIS — D631 Anemia in chronic kidney disease: Secondary | ICD-10-CM | POA: Diagnosis not present

## 2016-05-27 DIAGNOSIS — N2581 Secondary hyperparathyroidism of renal origin: Secondary | ICD-10-CM | POA: Diagnosis not present

## 2016-05-27 DIAGNOSIS — N186 End stage renal disease: Secondary | ICD-10-CM | POA: Diagnosis not present

## 2016-05-27 DIAGNOSIS — E8779 Other fluid overload: Secondary | ICD-10-CM | POA: Diagnosis not present

## 2016-05-27 DIAGNOSIS — D631 Anemia in chronic kidney disease: Secondary | ICD-10-CM | POA: Diagnosis not present

## 2016-05-30 DIAGNOSIS — N186 End stage renal disease: Secondary | ICD-10-CM | POA: Diagnosis not present

## 2016-05-30 DIAGNOSIS — N2581 Secondary hyperparathyroidism of renal origin: Secondary | ICD-10-CM | POA: Diagnosis not present

## 2016-05-30 DIAGNOSIS — D631 Anemia in chronic kidney disease: Secondary | ICD-10-CM | POA: Diagnosis not present

## 2016-05-30 DIAGNOSIS — E8779 Other fluid overload: Secondary | ICD-10-CM | POA: Diagnosis not present

## 2016-05-31 DIAGNOSIS — E8779 Other fluid overload: Secondary | ICD-10-CM | POA: Diagnosis not present

## 2016-05-31 DIAGNOSIS — D631 Anemia in chronic kidney disease: Secondary | ICD-10-CM | POA: Diagnosis not present

## 2016-05-31 DIAGNOSIS — N2581 Secondary hyperparathyroidism of renal origin: Secondary | ICD-10-CM | POA: Diagnosis not present

## 2016-05-31 DIAGNOSIS — N186 End stage renal disease: Secondary | ICD-10-CM | POA: Diagnosis not present

## 2016-06-01 DIAGNOSIS — N2581 Secondary hyperparathyroidism of renal origin: Secondary | ICD-10-CM | POA: Diagnosis not present

## 2016-06-01 DIAGNOSIS — E8779 Other fluid overload: Secondary | ICD-10-CM | POA: Diagnosis not present

## 2016-06-01 DIAGNOSIS — D631 Anemia in chronic kidney disease: Secondary | ICD-10-CM | POA: Diagnosis not present

## 2016-06-01 DIAGNOSIS — N186 End stage renal disease: Secondary | ICD-10-CM | POA: Diagnosis not present

## 2016-06-02 DIAGNOSIS — D631 Anemia in chronic kidney disease: Secondary | ICD-10-CM | POA: Diagnosis not present

## 2016-06-02 DIAGNOSIS — N186 End stage renal disease: Secondary | ICD-10-CM | POA: Diagnosis not present

## 2016-06-02 DIAGNOSIS — E8779 Other fluid overload: Secondary | ICD-10-CM | POA: Diagnosis not present

## 2016-06-02 DIAGNOSIS — N2581 Secondary hyperparathyroidism of renal origin: Secondary | ICD-10-CM | POA: Diagnosis not present

## 2016-06-03 DIAGNOSIS — N186 End stage renal disease: Secondary | ICD-10-CM | POA: Diagnosis not present

## 2016-06-03 DIAGNOSIS — N2581 Secondary hyperparathyroidism of renal origin: Secondary | ICD-10-CM | POA: Diagnosis not present

## 2016-06-03 DIAGNOSIS — D631 Anemia in chronic kidney disease: Secondary | ICD-10-CM | POA: Diagnosis not present

## 2016-06-03 DIAGNOSIS — E8779 Other fluid overload: Secondary | ICD-10-CM | POA: Diagnosis not present

## 2016-06-04 DIAGNOSIS — E1122 Type 2 diabetes mellitus with diabetic chronic kidney disease: Secondary | ICD-10-CM | POA: Diagnosis not present

## 2016-06-04 DIAGNOSIS — N186 End stage renal disease: Secondary | ICD-10-CM | POA: Diagnosis not present

## 2016-06-04 DIAGNOSIS — Z992 Dependence on renal dialysis: Secondary | ICD-10-CM | POA: Diagnosis not present

## 2016-06-06 DIAGNOSIS — N2589 Other disorders resulting from impaired renal tubular function: Secondary | ICD-10-CM | POA: Diagnosis not present

## 2016-06-06 DIAGNOSIS — E440 Moderate protein-calorie malnutrition: Secondary | ICD-10-CM | POA: Diagnosis not present

## 2016-06-06 DIAGNOSIS — E8779 Other fluid overload: Secondary | ICD-10-CM | POA: Diagnosis not present

## 2016-06-06 DIAGNOSIS — D509 Iron deficiency anemia, unspecified: Secondary | ICD-10-CM | POA: Diagnosis not present

## 2016-06-06 DIAGNOSIS — N186 End stage renal disease: Secondary | ICD-10-CM | POA: Diagnosis not present

## 2016-06-06 DIAGNOSIS — D630 Anemia in neoplastic disease: Secondary | ICD-10-CM | POA: Diagnosis not present

## 2016-06-06 DIAGNOSIS — D631 Anemia in chronic kidney disease: Secondary | ICD-10-CM | POA: Diagnosis not present

## 2016-06-06 DIAGNOSIS — N2581 Secondary hyperparathyroidism of renal origin: Secondary | ICD-10-CM | POA: Diagnosis not present

## 2016-06-06 DIAGNOSIS — Z79899 Other long term (current) drug therapy: Secondary | ICD-10-CM | POA: Diagnosis not present

## 2016-06-07 DIAGNOSIS — N2589 Other disorders resulting from impaired renal tubular function: Secondary | ICD-10-CM | POA: Diagnosis not present

## 2016-06-07 DIAGNOSIS — N186 End stage renal disease: Secondary | ICD-10-CM | POA: Diagnosis not present

## 2016-06-07 DIAGNOSIS — D630 Anemia in neoplastic disease: Secondary | ICD-10-CM | POA: Diagnosis not present

## 2016-06-07 DIAGNOSIS — D631 Anemia in chronic kidney disease: Secondary | ICD-10-CM | POA: Diagnosis not present

## 2016-06-07 DIAGNOSIS — D509 Iron deficiency anemia, unspecified: Secondary | ICD-10-CM | POA: Diagnosis not present

## 2016-06-07 DIAGNOSIS — Z79899 Other long term (current) drug therapy: Secondary | ICD-10-CM | POA: Diagnosis not present

## 2016-06-09 DIAGNOSIS — D509 Iron deficiency anemia, unspecified: Secondary | ICD-10-CM | POA: Diagnosis not present

## 2016-06-09 DIAGNOSIS — N2589 Other disorders resulting from impaired renal tubular function: Secondary | ICD-10-CM | POA: Diagnosis not present

## 2016-06-09 DIAGNOSIS — N186 End stage renal disease: Secondary | ICD-10-CM | POA: Diagnosis not present

## 2016-06-09 DIAGNOSIS — E1129 Type 2 diabetes mellitus with other diabetic kidney complication: Secondary | ICD-10-CM | POA: Diagnosis not present

## 2016-06-09 DIAGNOSIS — D631 Anemia in chronic kidney disease: Secondary | ICD-10-CM | POA: Diagnosis not present

## 2016-06-09 DIAGNOSIS — Z79899 Other long term (current) drug therapy: Secondary | ICD-10-CM | POA: Diagnosis not present

## 2016-06-09 DIAGNOSIS — D630 Anemia in neoplastic disease: Secondary | ICD-10-CM | POA: Diagnosis not present

## 2016-06-09 DIAGNOSIS — E784 Other hyperlipidemia: Secondary | ICD-10-CM | POA: Diagnosis not present

## 2016-06-10 DIAGNOSIS — N2589 Other disorders resulting from impaired renal tubular function: Secondary | ICD-10-CM | POA: Diagnosis not present

## 2016-06-10 DIAGNOSIS — Z79899 Other long term (current) drug therapy: Secondary | ICD-10-CM | POA: Diagnosis not present

## 2016-06-10 DIAGNOSIS — D630 Anemia in neoplastic disease: Secondary | ICD-10-CM | POA: Diagnosis not present

## 2016-06-10 DIAGNOSIS — D509 Iron deficiency anemia, unspecified: Secondary | ICD-10-CM | POA: Diagnosis not present

## 2016-06-10 DIAGNOSIS — N186 End stage renal disease: Secondary | ICD-10-CM | POA: Diagnosis not present

## 2016-06-10 DIAGNOSIS — D631 Anemia in chronic kidney disease: Secondary | ICD-10-CM | POA: Diagnosis not present

## 2016-06-13 DIAGNOSIS — N186 End stage renal disease: Secondary | ICD-10-CM | POA: Diagnosis not present

## 2016-06-13 DIAGNOSIS — D630 Anemia in neoplastic disease: Secondary | ICD-10-CM | POA: Diagnosis not present

## 2016-06-13 DIAGNOSIS — N2589 Other disorders resulting from impaired renal tubular function: Secondary | ICD-10-CM | POA: Diagnosis not present

## 2016-06-13 DIAGNOSIS — Z79899 Other long term (current) drug therapy: Secondary | ICD-10-CM | POA: Diagnosis not present

## 2016-06-13 DIAGNOSIS — D509 Iron deficiency anemia, unspecified: Secondary | ICD-10-CM | POA: Diagnosis not present

## 2016-06-13 DIAGNOSIS — D631 Anemia in chronic kidney disease: Secondary | ICD-10-CM | POA: Diagnosis not present

## 2016-06-14 DIAGNOSIS — D631 Anemia in chronic kidney disease: Secondary | ICD-10-CM | POA: Diagnosis not present

## 2016-06-14 DIAGNOSIS — N186 End stage renal disease: Secondary | ICD-10-CM | POA: Diagnosis not present

## 2016-06-14 DIAGNOSIS — D630 Anemia in neoplastic disease: Secondary | ICD-10-CM | POA: Diagnosis not present

## 2016-06-14 DIAGNOSIS — D509 Iron deficiency anemia, unspecified: Secondary | ICD-10-CM | POA: Diagnosis not present

## 2016-06-14 DIAGNOSIS — N2589 Other disorders resulting from impaired renal tubular function: Secondary | ICD-10-CM | POA: Diagnosis not present

## 2016-06-14 DIAGNOSIS — Z79899 Other long term (current) drug therapy: Secondary | ICD-10-CM | POA: Diagnosis not present

## 2016-06-16 DIAGNOSIS — D630 Anemia in neoplastic disease: Secondary | ICD-10-CM | POA: Diagnosis not present

## 2016-06-16 DIAGNOSIS — Z79899 Other long term (current) drug therapy: Secondary | ICD-10-CM | POA: Diagnosis not present

## 2016-06-16 DIAGNOSIS — D509 Iron deficiency anemia, unspecified: Secondary | ICD-10-CM | POA: Diagnosis not present

## 2016-06-16 DIAGNOSIS — N2589 Other disorders resulting from impaired renal tubular function: Secondary | ICD-10-CM | POA: Diagnosis not present

## 2016-06-16 DIAGNOSIS — N186 End stage renal disease: Secondary | ICD-10-CM | POA: Diagnosis not present

## 2016-06-16 DIAGNOSIS — D631 Anemia in chronic kidney disease: Secondary | ICD-10-CM | POA: Diagnosis not present

## 2016-06-17 DIAGNOSIS — D509 Iron deficiency anemia, unspecified: Secondary | ICD-10-CM | POA: Diagnosis not present

## 2016-06-17 DIAGNOSIS — N2589 Other disorders resulting from impaired renal tubular function: Secondary | ICD-10-CM | POA: Diagnosis not present

## 2016-06-17 DIAGNOSIS — N186 End stage renal disease: Secondary | ICD-10-CM | POA: Diagnosis not present

## 2016-06-17 DIAGNOSIS — D630 Anemia in neoplastic disease: Secondary | ICD-10-CM | POA: Diagnosis not present

## 2016-06-17 DIAGNOSIS — D631 Anemia in chronic kidney disease: Secondary | ICD-10-CM | POA: Diagnosis not present

## 2016-06-17 DIAGNOSIS — Z79899 Other long term (current) drug therapy: Secondary | ICD-10-CM | POA: Diagnosis not present

## 2016-06-20 DIAGNOSIS — D631 Anemia in chronic kidney disease: Secondary | ICD-10-CM | POA: Diagnosis not present

## 2016-06-20 DIAGNOSIS — D630 Anemia in neoplastic disease: Secondary | ICD-10-CM | POA: Diagnosis not present

## 2016-06-20 DIAGNOSIS — N2589 Other disorders resulting from impaired renal tubular function: Secondary | ICD-10-CM | POA: Diagnosis not present

## 2016-06-20 DIAGNOSIS — N186 End stage renal disease: Secondary | ICD-10-CM | POA: Diagnosis not present

## 2016-06-20 DIAGNOSIS — D509 Iron deficiency anemia, unspecified: Secondary | ICD-10-CM | POA: Diagnosis not present

## 2016-06-20 DIAGNOSIS — Z79899 Other long term (current) drug therapy: Secondary | ICD-10-CM | POA: Diagnosis not present

## 2016-06-21 DIAGNOSIS — N186 End stage renal disease: Secondary | ICD-10-CM | POA: Diagnosis not present

## 2016-06-21 DIAGNOSIS — D509 Iron deficiency anemia, unspecified: Secondary | ICD-10-CM | POA: Diagnosis not present

## 2016-06-21 DIAGNOSIS — D630 Anemia in neoplastic disease: Secondary | ICD-10-CM | POA: Diagnosis not present

## 2016-06-21 DIAGNOSIS — D631 Anemia in chronic kidney disease: Secondary | ICD-10-CM | POA: Diagnosis not present

## 2016-06-21 DIAGNOSIS — Z79899 Other long term (current) drug therapy: Secondary | ICD-10-CM | POA: Diagnosis not present

## 2016-06-21 DIAGNOSIS — N2589 Other disorders resulting from impaired renal tubular function: Secondary | ICD-10-CM | POA: Diagnosis not present

## 2016-06-22 DIAGNOSIS — D509 Iron deficiency anemia, unspecified: Secondary | ICD-10-CM | POA: Diagnosis not present

## 2016-06-22 DIAGNOSIS — Z79899 Other long term (current) drug therapy: Secondary | ICD-10-CM | POA: Diagnosis not present

## 2016-06-22 DIAGNOSIS — D631 Anemia in chronic kidney disease: Secondary | ICD-10-CM | POA: Diagnosis not present

## 2016-06-22 DIAGNOSIS — D630 Anemia in neoplastic disease: Secondary | ICD-10-CM | POA: Diagnosis not present

## 2016-06-22 DIAGNOSIS — N2589 Other disorders resulting from impaired renal tubular function: Secondary | ICD-10-CM | POA: Diagnosis not present

## 2016-06-22 DIAGNOSIS — N186 End stage renal disease: Secondary | ICD-10-CM | POA: Diagnosis not present

## 2016-06-23 DIAGNOSIS — D631 Anemia in chronic kidney disease: Secondary | ICD-10-CM | POA: Diagnosis not present

## 2016-06-23 DIAGNOSIS — Z79899 Other long term (current) drug therapy: Secondary | ICD-10-CM | POA: Diagnosis not present

## 2016-06-23 DIAGNOSIS — D509 Iron deficiency anemia, unspecified: Secondary | ICD-10-CM | POA: Diagnosis not present

## 2016-06-23 DIAGNOSIS — N2589 Other disorders resulting from impaired renal tubular function: Secondary | ICD-10-CM | POA: Diagnosis not present

## 2016-06-23 DIAGNOSIS — N186 End stage renal disease: Secondary | ICD-10-CM | POA: Diagnosis not present

## 2016-06-23 DIAGNOSIS — D630 Anemia in neoplastic disease: Secondary | ICD-10-CM | POA: Diagnosis not present

## 2016-06-24 DIAGNOSIS — D630 Anemia in neoplastic disease: Secondary | ICD-10-CM | POA: Diagnosis not present

## 2016-06-24 DIAGNOSIS — N2589 Other disorders resulting from impaired renal tubular function: Secondary | ICD-10-CM | POA: Diagnosis not present

## 2016-06-24 DIAGNOSIS — N186 End stage renal disease: Secondary | ICD-10-CM | POA: Diagnosis not present

## 2016-06-24 DIAGNOSIS — R739 Hyperglycemia, unspecified: Secondary | ICD-10-CM | POA: Diagnosis not present

## 2016-06-24 DIAGNOSIS — E161 Other hypoglycemia: Secondary | ICD-10-CM | POA: Diagnosis not present

## 2016-06-24 DIAGNOSIS — D631 Anemia in chronic kidney disease: Secondary | ICD-10-CM | POA: Diagnosis not present

## 2016-06-24 DIAGNOSIS — Z79899 Other long term (current) drug therapy: Secondary | ICD-10-CM | POA: Diagnosis not present

## 2016-06-24 DIAGNOSIS — D509 Iron deficiency anemia, unspecified: Secondary | ICD-10-CM | POA: Diagnosis not present

## 2016-06-27 DIAGNOSIS — Z79899 Other long term (current) drug therapy: Secondary | ICD-10-CM | POA: Diagnosis not present

## 2016-06-27 DIAGNOSIS — N186 End stage renal disease: Secondary | ICD-10-CM | POA: Diagnosis not present

## 2016-06-27 DIAGNOSIS — D509 Iron deficiency anemia, unspecified: Secondary | ICD-10-CM | POA: Diagnosis not present

## 2016-06-27 DIAGNOSIS — N2589 Other disorders resulting from impaired renal tubular function: Secondary | ICD-10-CM | POA: Diagnosis not present

## 2016-06-27 DIAGNOSIS — D630 Anemia in neoplastic disease: Secondary | ICD-10-CM | POA: Diagnosis not present

## 2016-06-27 DIAGNOSIS — D631 Anemia in chronic kidney disease: Secondary | ICD-10-CM | POA: Diagnosis not present

## 2016-06-28 DIAGNOSIS — N186 End stage renal disease: Secondary | ICD-10-CM | POA: Diagnosis not present

## 2016-06-28 DIAGNOSIS — Z79899 Other long term (current) drug therapy: Secondary | ICD-10-CM | POA: Diagnosis not present

## 2016-06-28 DIAGNOSIS — N2589 Other disorders resulting from impaired renal tubular function: Secondary | ICD-10-CM | POA: Diagnosis not present

## 2016-06-28 DIAGNOSIS — D630 Anemia in neoplastic disease: Secondary | ICD-10-CM | POA: Diagnosis not present

## 2016-06-28 DIAGNOSIS — D631 Anemia in chronic kidney disease: Secondary | ICD-10-CM | POA: Diagnosis not present

## 2016-06-28 DIAGNOSIS — D509 Iron deficiency anemia, unspecified: Secondary | ICD-10-CM | POA: Diagnosis not present

## 2016-06-30 DIAGNOSIS — D630 Anemia in neoplastic disease: Secondary | ICD-10-CM | POA: Diagnosis not present

## 2016-06-30 DIAGNOSIS — D509 Iron deficiency anemia, unspecified: Secondary | ICD-10-CM | POA: Diagnosis not present

## 2016-06-30 DIAGNOSIS — D631 Anemia in chronic kidney disease: Secondary | ICD-10-CM | POA: Diagnosis not present

## 2016-06-30 DIAGNOSIS — N186 End stage renal disease: Secondary | ICD-10-CM | POA: Diagnosis not present

## 2016-06-30 DIAGNOSIS — N2589 Other disorders resulting from impaired renal tubular function: Secondary | ICD-10-CM | POA: Diagnosis not present

## 2016-06-30 DIAGNOSIS — Z79899 Other long term (current) drug therapy: Secondary | ICD-10-CM | POA: Diagnosis not present

## 2016-07-01 DIAGNOSIS — D630 Anemia in neoplastic disease: Secondary | ICD-10-CM | POA: Diagnosis not present

## 2016-07-01 DIAGNOSIS — N186 End stage renal disease: Secondary | ICD-10-CM | POA: Diagnosis not present

## 2016-07-01 DIAGNOSIS — N2589 Other disorders resulting from impaired renal tubular function: Secondary | ICD-10-CM | POA: Diagnosis not present

## 2016-07-01 DIAGNOSIS — D631 Anemia in chronic kidney disease: Secondary | ICD-10-CM | POA: Diagnosis not present

## 2016-07-01 DIAGNOSIS — D509 Iron deficiency anemia, unspecified: Secondary | ICD-10-CM | POA: Diagnosis not present

## 2016-07-01 DIAGNOSIS — Z79899 Other long term (current) drug therapy: Secondary | ICD-10-CM | POA: Diagnosis not present

## 2016-07-04 DIAGNOSIS — D509 Iron deficiency anemia, unspecified: Secondary | ICD-10-CM | POA: Diagnosis not present

## 2016-07-04 DIAGNOSIS — N2589 Other disorders resulting from impaired renal tubular function: Secondary | ICD-10-CM | POA: Diagnosis not present

## 2016-07-04 DIAGNOSIS — Z79899 Other long term (current) drug therapy: Secondary | ICD-10-CM | POA: Diagnosis not present

## 2016-07-04 DIAGNOSIS — N186 End stage renal disease: Secondary | ICD-10-CM | POA: Diagnosis not present

## 2016-07-04 DIAGNOSIS — Z992 Dependence on renal dialysis: Secondary | ICD-10-CM | POA: Diagnosis not present

## 2016-07-04 DIAGNOSIS — D630 Anemia in neoplastic disease: Secondary | ICD-10-CM | POA: Diagnosis not present

## 2016-07-04 DIAGNOSIS — E1122 Type 2 diabetes mellitus with diabetic chronic kidney disease: Secondary | ICD-10-CM | POA: Diagnosis not present

## 2016-07-04 DIAGNOSIS — D631 Anemia in chronic kidney disease: Secondary | ICD-10-CM | POA: Diagnosis not present

## 2016-07-05 DIAGNOSIS — D509 Iron deficiency anemia, unspecified: Secondary | ICD-10-CM | POA: Diagnosis not present

## 2016-07-05 DIAGNOSIS — D631 Anemia in chronic kidney disease: Secondary | ICD-10-CM | POA: Diagnosis not present

## 2016-07-05 DIAGNOSIS — Z79899 Other long term (current) drug therapy: Secondary | ICD-10-CM | POA: Diagnosis not present

## 2016-07-05 DIAGNOSIS — N2589 Other disorders resulting from impaired renal tubular function: Secondary | ICD-10-CM | POA: Diagnosis not present

## 2016-07-05 DIAGNOSIS — D630 Anemia in neoplastic disease: Secondary | ICD-10-CM | POA: Diagnosis not present

## 2016-07-05 DIAGNOSIS — E440 Moderate protein-calorie malnutrition: Secondary | ICD-10-CM | POA: Diagnosis not present

## 2016-07-05 DIAGNOSIS — E8779 Other fluid overload: Secondary | ICD-10-CM | POA: Diagnosis not present

## 2016-07-05 DIAGNOSIS — N2581 Secondary hyperparathyroidism of renal origin: Secondary | ICD-10-CM | POA: Diagnosis not present

## 2016-07-05 DIAGNOSIS — N186 End stage renal disease: Secondary | ICD-10-CM | POA: Diagnosis not present

## 2016-07-07 DIAGNOSIS — Z79899 Other long term (current) drug therapy: Secondary | ICD-10-CM | POA: Diagnosis not present

## 2016-07-07 DIAGNOSIS — N2581 Secondary hyperparathyroidism of renal origin: Secondary | ICD-10-CM | POA: Diagnosis not present

## 2016-07-07 DIAGNOSIS — N2589 Other disorders resulting from impaired renal tubular function: Secondary | ICD-10-CM | POA: Diagnosis not present

## 2016-07-07 DIAGNOSIS — E440 Moderate protein-calorie malnutrition: Secondary | ICD-10-CM | POA: Diagnosis not present

## 2016-07-07 DIAGNOSIS — N186 End stage renal disease: Secondary | ICD-10-CM | POA: Diagnosis not present

## 2016-07-07 DIAGNOSIS — D509 Iron deficiency anemia, unspecified: Secondary | ICD-10-CM | POA: Diagnosis not present

## 2016-07-08 DIAGNOSIS — E440 Moderate protein-calorie malnutrition: Secondary | ICD-10-CM | POA: Diagnosis not present

## 2016-07-08 DIAGNOSIS — N186 End stage renal disease: Secondary | ICD-10-CM | POA: Diagnosis not present

## 2016-07-08 DIAGNOSIS — N2589 Other disorders resulting from impaired renal tubular function: Secondary | ICD-10-CM | POA: Diagnosis not present

## 2016-07-08 DIAGNOSIS — N2581 Secondary hyperparathyroidism of renal origin: Secondary | ICD-10-CM | POA: Diagnosis not present

## 2016-07-08 DIAGNOSIS — Z79899 Other long term (current) drug therapy: Secondary | ICD-10-CM | POA: Diagnosis not present

## 2016-07-08 DIAGNOSIS — D509 Iron deficiency anemia, unspecified: Secondary | ICD-10-CM | POA: Diagnosis not present

## 2016-07-11 DIAGNOSIS — N2589 Other disorders resulting from impaired renal tubular function: Secondary | ICD-10-CM | POA: Diagnosis not present

## 2016-07-11 DIAGNOSIS — N2581 Secondary hyperparathyroidism of renal origin: Secondary | ICD-10-CM | POA: Diagnosis not present

## 2016-07-11 DIAGNOSIS — E440 Moderate protein-calorie malnutrition: Secondary | ICD-10-CM | POA: Diagnosis not present

## 2016-07-11 DIAGNOSIS — E784 Other hyperlipidemia: Secondary | ICD-10-CM | POA: Diagnosis not present

## 2016-07-11 DIAGNOSIS — D509 Iron deficiency anemia, unspecified: Secondary | ICD-10-CM | POA: Diagnosis not present

## 2016-07-11 DIAGNOSIS — Z79899 Other long term (current) drug therapy: Secondary | ICD-10-CM | POA: Diagnosis not present

## 2016-07-11 DIAGNOSIS — N186 End stage renal disease: Secondary | ICD-10-CM | POA: Diagnosis not present

## 2016-07-12 DIAGNOSIS — N186 End stage renal disease: Secondary | ICD-10-CM | POA: Diagnosis not present

## 2016-07-12 DIAGNOSIS — N2581 Secondary hyperparathyroidism of renal origin: Secondary | ICD-10-CM | POA: Diagnosis not present

## 2016-07-12 DIAGNOSIS — D509 Iron deficiency anemia, unspecified: Secondary | ICD-10-CM | POA: Diagnosis not present

## 2016-07-12 DIAGNOSIS — N2589 Other disorders resulting from impaired renal tubular function: Secondary | ICD-10-CM | POA: Diagnosis not present

## 2016-07-12 DIAGNOSIS — Z79899 Other long term (current) drug therapy: Secondary | ICD-10-CM | POA: Diagnosis not present

## 2016-07-12 DIAGNOSIS — E440 Moderate protein-calorie malnutrition: Secondary | ICD-10-CM | POA: Diagnosis not present

## 2016-07-14 DIAGNOSIS — N186 End stage renal disease: Secondary | ICD-10-CM | POA: Diagnosis not present

## 2016-07-14 DIAGNOSIS — Z79899 Other long term (current) drug therapy: Secondary | ICD-10-CM | POA: Diagnosis not present

## 2016-07-14 DIAGNOSIS — E440 Moderate protein-calorie malnutrition: Secondary | ICD-10-CM | POA: Diagnosis not present

## 2016-07-14 DIAGNOSIS — D509 Iron deficiency anemia, unspecified: Secondary | ICD-10-CM | POA: Diagnosis not present

## 2016-07-14 DIAGNOSIS — N2589 Other disorders resulting from impaired renal tubular function: Secondary | ICD-10-CM | POA: Diagnosis not present

## 2016-07-14 DIAGNOSIS — N2581 Secondary hyperparathyroidism of renal origin: Secondary | ICD-10-CM | POA: Diagnosis not present

## 2016-07-15 DIAGNOSIS — D509 Iron deficiency anemia, unspecified: Secondary | ICD-10-CM | POA: Diagnosis not present

## 2016-07-15 DIAGNOSIS — N2581 Secondary hyperparathyroidism of renal origin: Secondary | ICD-10-CM | POA: Diagnosis not present

## 2016-07-15 DIAGNOSIS — N186 End stage renal disease: Secondary | ICD-10-CM | POA: Diagnosis not present

## 2016-07-15 DIAGNOSIS — N2589 Other disorders resulting from impaired renal tubular function: Secondary | ICD-10-CM | POA: Diagnosis not present

## 2016-07-15 DIAGNOSIS — Z79899 Other long term (current) drug therapy: Secondary | ICD-10-CM | POA: Diagnosis not present

## 2016-07-15 DIAGNOSIS — E440 Moderate protein-calorie malnutrition: Secondary | ICD-10-CM | POA: Diagnosis not present

## 2016-07-18 DIAGNOSIS — N186 End stage renal disease: Secondary | ICD-10-CM | POA: Diagnosis not present

## 2016-07-18 DIAGNOSIS — N2581 Secondary hyperparathyroidism of renal origin: Secondary | ICD-10-CM | POA: Diagnosis not present

## 2016-07-18 DIAGNOSIS — N2589 Other disorders resulting from impaired renal tubular function: Secondary | ICD-10-CM | POA: Diagnosis not present

## 2016-07-18 DIAGNOSIS — D509 Iron deficiency anemia, unspecified: Secondary | ICD-10-CM | POA: Diagnosis not present

## 2016-07-18 DIAGNOSIS — Z79899 Other long term (current) drug therapy: Secondary | ICD-10-CM | POA: Diagnosis not present

## 2016-07-18 DIAGNOSIS — E440 Moderate protein-calorie malnutrition: Secondary | ICD-10-CM | POA: Diagnosis not present

## 2016-07-19 DIAGNOSIS — E440 Moderate protein-calorie malnutrition: Secondary | ICD-10-CM | POA: Diagnosis not present

## 2016-07-19 DIAGNOSIS — Z79899 Other long term (current) drug therapy: Secondary | ICD-10-CM | POA: Diagnosis not present

## 2016-07-19 DIAGNOSIS — N186 End stage renal disease: Secondary | ICD-10-CM | POA: Diagnosis not present

## 2016-07-19 DIAGNOSIS — D509 Iron deficiency anemia, unspecified: Secondary | ICD-10-CM | POA: Diagnosis not present

## 2016-07-19 DIAGNOSIS — N2589 Other disorders resulting from impaired renal tubular function: Secondary | ICD-10-CM | POA: Diagnosis not present

## 2016-07-19 DIAGNOSIS — N2581 Secondary hyperparathyroidism of renal origin: Secondary | ICD-10-CM | POA: Diagnosis not present

## 2016-07-21 ENCOUNTER — Telehealth: Payer: Self-pay | Admitting: Endocrinology

## 2016-07-21 DIAGNOSIS — Z79899 Other long term (current) drug therapy: Secondary | ICD-10-CM | POA: Diagnosis not present

## 2016-07-21 DIAGNOSIS — N2589 Other disorders resulting from impaired renal tubular function: Secondary | ICD-10-CM | POA: Diagnosis not present

## 2016-07-21 DIAGNOSIS — E440 Moderate protein-calorie malnutrition: Secondary | ICD-10-CM | POA: Diagnosis not present

## 2016-07-21 DIAGNOSIS — D509 Iron deficiency anemia, unspecified: Secondary | ICD-10-CM | POA: Diagnosis not present

## 2016-07-21 DIAGNOSIS — N2581 Secondary hyperparathyroidism of renal origin: Secondary | ICD-10-CM | POA: Diagnosis not present

## 2016-07-21 DIAGNOSIS — N186 End stage renal disease: Secondary | ICD-10-CM | POA: Diagnosis not present

## 2016-07-22 DIAGNOSIS — N2581 Secondary hyperparathyroidism of renal origin: Secondary | ICD-10-CM | POA: Diagnosis not present

## 2016-07-22 DIAGNOSIS — N186 End stage renal disease: Secondary | ICD-10-CM | POA: Diagnosis not present

## 2016-07-22 DIAGNOSIS — Z79899 Other long term (current) drug therapy: Secondary | ICD-10-CM | POA: Diagnosis not present

## 2016-07-22 DIAGNOSIS — N2589 Other disorders resulting from impaired renal tubular function: Secondary | ICD-10-CM | POA: Diagnosis not present

## 2016-07-22 DIAGNOSIS — E440 Moderate protein-calorie malnutrition: Secondary | ICD-10-CM | POA: Diagnosis not present

## 2016-07-22 DIAGNOSIS — D509 Iron deficiency anemia, unspecified: Secondary | ICD-10-CM | POA: Diagnosis not present

## 2016-07-25 ENCOUNTER — Ambulatory Visit: Payer: Self-pay | Admitting: Endocrinology

## 2016-07-25 DIAGNOSIS — D509 Iron deficiency anemia, unspecified: Secondary | ICD-10-CM | POA: Diagnosis not present

## 2016-07-25 DIAGNOSIS — Z0289 Encounter for other administrative examinations: Secondary | ICD-10-CM

## 2016-07-25 DIAGNOSIS — N2581 Secondary hyperparathyroidism of renal origin: Secondary | ICD-10-CM | POA: Diagnosis not present

## 2016-07-25 DIAGNOSIS — N186 End stage renal disease: Secondary | ICD-10-CM | POA: Diagnosis not present

## 2016-07-25 DIAGNOSIS — N2589 Other disorders resulting from impaired renal tubular function: Secondary | ICD-10-CM | POA: Diagnosis not present

## 2016-07-25 DIAGNOSIS — E440 Moderate protein-calorie malnutrition: Secondary | ICD-10-CM | POA: Diagnosis not present

## 2016-07-25 DIAGNOSIS — Z79899 Other long term (current) drug therapy: Secondary | ICD-10-CM | POA: Diagnosis not present

## 2016-07-26 DIAGNOSIS — N2589 Other disorders resulting from impaired renal tubular function: Secondary | ICD-10-CM | POA: Diagnosis not present

## 2016-07-26 DIAGNOSIS — Z79899 Other long term (current) drug therapy: Secondary | ICD-10-CM | POA: Diagnosis not present

## 2016-07-26 DIAGNOSIS — N2581 Secondary hyperparathyroidism of renal origin: Secondary | ICD-10-CM | POA: Diagnosis not present

## 2016-07-26 DIAGNOSIS — D509 Iron deficiency anemia, unspecified: Secondary | ICD-10-CM | POA: Diagnosis not present

## 2016-07-26 DIAGNOSIS — E440 Moderate protein-calorie malnutrition: Secondary | ICD-10-CM | POA: Diagnosis not present

## 2016-07-26 DIAGNOSIS — N186 End stage renal disease: Secondary | ICD-10-CM | POA: Diagnosis not present

## 2016-07-28 DIAGNOSIS — D509 Iron deficiency anemia, unspecified: Secondary | ICD-10-CM | POA: Diagnosis not present

## 2016-07-28 DIAGNOSIS — Z79899 Other long term (current) drug therapy: Secondary | ICD-10-CM | POA: Diagnosis not present

## 2016-07-28 DIAGNOSIS — N2581 Secondary hyperparathyroidism of renal origin: Secondary | ICD-10-CM | POA: Diagnosis not present

## 2016-07-28 DIAGNOSIS — N2589 Other disorders resulting from impaired renal tubular function: Secondary | ICD-10-CM | POA: Diagnosis not present

## 2016-07-28 DIAGNOSIS — N186 End stage renal disease: Secondary | ICD-10-CM | POA: Diagnosis not present

## 2016-07-28 DIAGNOSIS — E440 Moderate protein-calorie malnutrition: Secondary | ICD-10-CM | POA: Diagnosis not present

## 2016-07-29 DIAGNOSIS — Z79899 Other long term (current) drug therapy: Secondary | ICD-10-CM | POA: Diagnosis not present

## 2016-07-29 DIAGNOSIS — N186 End stage renal disease: Secondary | ICD-10-CM | POA: Diagnosis not present

## 2016-07-29 DIAGNOSIS — N2581 Secondary hyperparathyroidism of renal origin: Secondary | ICD-10-CM | POA: Diagnosis not present

## 2016-07-29 DIAGNOSIS — N2589 Other disorders resulting from impaired renal tubular function: Secondary | ICD-10-CM | POA: Diagnosis not present

## 2016-07-29 DIAGNOSIS — D509 Iron deficiency anemia, unspecified: Secondary | ICD-10-CM | POA: Diagnosis not present

## 2016-07-29 DIAGNOSIS — E440 Moderate protein-calorie malnutrition: Secondary | ICD-10-CM | POA: Diagnosis not present

## 2016-08-01 DIAGNOSIS — N2581 Secondary hyperparathyroidism of renal origin: Secondary | ICD-10-CM | POA: Diagnosis not present

## 2016-08-01 DIAGNOSIS — N186 End stage renal disease: Secondary | ICD-10-CM | POA: Diagnosis not present

## 2016-08-01 DIAGNOSIS — E440 Moderate protein-calorie malnutrition: Secondary | ICD-10-CM | POA: Diagnosis not present

## 2016-08-01 DIAGNOSIS — N2589 Other disorders resulting from impaired renal tubular function: Secondary | ICD-10-CM | POA: Diagnosis not present

## 2016-08-01 DIAGNOSIS — Z79899 Other long term (current) drug therapy: Secondary | ICD-10-CM | POA: Diagnosis not present

## 2016-08-01 DIAGNOSIS — D509 Iron deficiency anemia, unspecified: Secondary | ICD-10-CM | POA: Diagnosis not present

## 2016-08-02 DIAGNOSIS — N2589 Other disorders resulting from impaired renal tubular function: Secondary | ICD-10-CM | POA: Diagnosis not present

## 2016-08-02 DIAGNOSIS — Z79899 Other long term (current) drug therapy: Secondary | ICD-10-CM | POA: Diagnosis not present

## 2016-08-02 DIAGNOSIS — E440 Moderate protein-calorie malnutrition: Secondary | ICD-10-CM | POA: Diagnosis not present

## 2016-08-02 DIAGNOSIS — N186 End stage renal disease: Secondary | ICD-10-CM | POA: Diagnosis not present

## 2016-08-02 DIAGNOSIS — D509 Iron deficiency anemia, unspecified: Secondary | ICD-10-CM | POA: Diagnosis not present

## 2016-08-02 DIAGNOSIS — N2581 Secondary hyperparathyroidism of renal origin: Secondary | ICD-10-CM | POA: Diagnosis not present

## 2016-08-04 DIAGNOSIS — E440 Moderate protein-calorie malnutrition: Secondary | ICD-10-CM | POA: Diagnosis not present

## 2016-08-04 DIAGNOSIS — D509 Iron deficiency anemia, unspecified: Secondary | ICD-10-CM | POA: Diagnosis not present

## 2016-08-04 DIAGNOSIS — E1122 Type 2 diabetes mellitus with diabetic chronic kidney disease: Secondary | ICD-10-CM | POA: Diagnosis not present

## 2016-08-04 DIAGNOSIS — Z79899 Other long term (current) drug therapy: Secondary | ICD-10-CM | POA: Diagnosis not present

## 2016-08-04 DIAGNOSIS — N2589 Other disorders resulting from impaired renal tubular function: Secondary | ICD-10-CM | POA: Diagnosis not present

## 2016-08-04 DIAGNOSIS — Z992 Dependence on renal dialysis: Secondary | ICD-10-CM | POA: Diagnosis not present

## 2016-08-04 DIAGNOSIS — N2581 Secondary hyperparathyroidism of renal origin: Secondary | ICD-10-CM | POA: Diagnosis not present

## 2016-08-04 DIAGNOSIS — N186 End stage renal disease: Secondary | ICD-10-CM | POA: Diagnosis not present

## 2016-08-05 DIAGNOSIS — D631 Anemia in chronic kidney disease: Secondary | ICD-10-CM | POA: Diagnosis not present

## 2016-08-05 DIAGNOSIS — N2581 Secondary hyperparathyroidism of renal origin: Secondary | ICD-10-CM | POA: Diagnosis not present

## 2016-08-05 DIAGNOSIS — E8779 Other fluid overload: Secondary | ICD-10-CM | POA: Diagnosis not present

## 2016-08-05 DIAGNOSIS — N2589 Other disorders resulting from impaired renal tubular function: Secondary | ICD-10-CM | POA: Diagnosis not present

## 2016-08-05 DIAGNOSIS — N186 End stage renal disease: Secondary | ICD-10-CM | POA: Diagnosis not present

## 2016-08-05 DIAGNOSIS — Z79899 Other long term (current) drug therapy: Secondary | ICD-10-CM | POA: Diagnosis not present

## 2016-08-05 DIAGNOSIS — D509 Iron deficiency anemia, unspecified: Secondary | ICD-10-CM | POA: Diagnosis not present

## 2016-08-05 DIAGNOSIS — E440 Moderate protein-calorie malnutrition: Secondary | ICD-10-CM | POA: Diagnosis not present

## 2016-08-05 DIAGNOSIS — D630 Anemia in neoplastic disease: Secondary | ICD-10-CM | POA: Diagnosis not present

## 2016-08-05 DIAGNOSIS — K769 Liver disease, unspecified: Secondary | ICD-10-CM | POA: Diagnosis not present

## 2016-08-08 DIAGNOSIS — N2589 Other disorders resulting from impaired renal tubular function: Secondary | ICD-10-CM | POA: Diagnosis not present

## 2016-08-08 DIAGNOSIS — Z79899 Other long term (current) drug therapy: Secondary | ICD-10-CM | POA: Diagnosis not present

## 2016-08-08 DIAGNOSIS — D631 Anemia in chronic kidney disease: Secondary | ICD-10-CM | POA: Diagnosis not present

## 2016-08-08 DIAGNOSIS — E440 Moderate protein-calorie malnutrition: Secondary | ICD-10-CM | POA: Diagnosis not present

## 2016-08-08 DIAGNOSIS — D509 Iron deficiency anemia, unspecified: Secondary | ICD-10-CM | POA: Diagnosis not present

## 2016-08-08 DIAGNOSIS — N186 End stage renal disease: Secondary | ICD-10-CM | POA: Diagnosis not present

## 2016-08-09 DIAGNOSIS — E440 Moderate protein-calorie malnutrition: Secondary | ICD-10-CM | POA: Diagnosis not present

## 2016-08-09 DIAGNOSIS — D631 Anemia in chronic kidney disease: Secondary | ICD-10-CM | POA: Diagnosis not present

## 2016-08-09 DIAGNOSIS — N2589 Other disorders resulting from impaired renal tubular function: Secondary | ICD-10-CM | POA: Diagnosis not present

## 2016-08-09 DIAGNOSIS — N186 End stage renal disease: Secondary | ICD-10-CM | POA: Diagnosis not present

## 2016-08-09 DIAGNOSIS — Z79899 Other long term (current) drug therapy: Secondary | ICD-10-CM | POA: Diagnosis not present

## 2016-08-09 DIAGNOSIS — D509 Iron deficiency anemia, unspecified: Secondary | ICD-10-CM | POA: Diagnosis not present

## 2016-08-11 DIAGNOSIS — Z79899 Other long term (current) drug therapy: Secondary | ICD-10-CM | POA: Diagnosis not present

## 2016-08-11 DIAGNOSIS — N186 End stage renal disease: Secondary | ICD-10-CM | POA: Diagnosis not present

## 2016-08-11 DIAGNOSIS — D509 Iron deficiency anemia, unspecified: Secondary | ICD-10-CM | POA: Diagnosis not present

## 2016-08-11 DIAGNOSIS — N2589 Other disorders resulting from impaired renal tubular function: Secondary | ICD-10-CM | POA: Diagnosis not present

## 2016-08-11 DIAGNOSIS — D631 Anemia in chronic kidney disease: Secondary | ICD-10-CM | POA: Diagnosis not present

## 2016-08-11 DIAGNOSIS — E440 Moderate protein-calorie malnutrition: Secondary | ICD-10-CM | POA: Diagnosis not present

## 2016-08-11 DIAGNOSIS — E784 Other hyperlipidemia: Secondary | ICD-10-CM | POA: Diagnosis not present

## 2016-08-15 DIAGNOSIS — D631 Anemia in chronic kidney disease: Secondary | ICD-10-CM | POA: Diagnosis not present

## 2016-08-15 DIAGNOSIS — Z79899 Other long term (current) drug therapy: Secondary | ICD-10-CM | POA: Diagnosis not present

## 2016-08-15 DIAGNOSIS — D509 Iron deficiency anemia, unspecified: Secondary | ICD-10-CM | POA: Diagnosis not present

## 2016-08-15 DIAGNOSIS — E440 Moderate protein-calorie malnutrition: Secondary | ICD-10-CM | POA: Diagnosis not present

## 2016-08-15 DIAGNOSIS — N2589 Other disorders resulting from impaired renal tubular function: Secondary | ICD-10-CM | POA: Diagnosis not present

## 2016-08-15 DIAGNOSIS — N186 End stage renal disease: Secondary | ICD-10-CM | POA: Diagnosis not present

## 2016-08-17 DIAGNOSIS — N2581 Secondary hyperparathyroidism of renal origin: Secondary | ICD-10-CM | POA: Diagnosis not present

## 2016-08-17 DIAGNOSIS — N186 End stage renal disease: Secondary | ICD-10-CM | POA: Diagnosis not present

## 2016-08-17 DIAGNOSIS — E1129 Type 2 diabetes mellitus with other diabetic kidney complication: Secondary | ICD-10-CM | POA: Diagnosis not present

## 2016-08-22 DIAGNOSIS — N186 End stage renal disease: Secondary | ICD-10-CM | POA: Diagnosis not present

## 2016-08-22 DIAGNOSIS — N2581 Secondary hyperparathyroidism of renal origin: Secondary | ICD-10-CM | POA: Diagnosis not present

## 2016-08-22 DIAGNOSIS — E1129 Type 2 diabetes mellitus with other diabetic kidney complication: Secondary | ICD-10-CM | POA: Diagnosis not present

## 2016-08-24 DIAGNOSIS — N186 End stage renal disease: Secondary | ICD-10-CM | POA: Diagnosis not present

## 2016-08-24 DIAGNOSIS — E1129 Type 2 diabetes mellitus with other diabetic kidney complication: Secondary | ICD-10-CM | POA: Diagnosis not present

## 2016-08-24 DIAGNOSIS — N2581 Secondary hyperparathyroidism of renal origin: Secondary | ICD-10-CM | POA: Diagnosis not present

## 2016-08-29 DIAGNOSIS — N186 End stage renal disease: Secondary | ICD-10-CM | POA: Diagnosis not present

## 2016-08-29 DIAGNOSIS — N2581 Secondary hyperparathyroidism of renal origin: Secondary | ICD-10-CM | POA: Diagnosis not present

## 2016-08-29 DIAGNOSIS — E1129 Type 2 diabetes mellitus with other diabetic kidney complication: Secondary | ICD-10-CM | POA: Diagnosis not present

## 2016-09-03 DIAGNOSIS — E1122 Type 2 diabetes mellitus with diabetic chronic kidney disease: Secondary | ICD-10-CM | POA: Diagnosis not present

## 2016-09-03 DIAGNOSIS — Z992 Dependence on renal dialysis: Secondary | ICD-10-CM | POA: Diagnosis not present

## 2016-09-03 DIAGNOSIS — E1129 Type 2 diabetes mellitus with other diabetic kidney complication: Secondary | ICD-10-CM | POA: Diagnosis not present

## 2016-09-03 DIAGNOSIS — N186 End stage renal disease: Secondary | ICD-10-CM | POA: Diagnosis not present

## 2016-09-03 DIAGNOSIS — N2581 Secondary hyperparathyroidism of renal origin: Secondary | ICD-10-CM | POA: Diagnosis not present

## 2016-09-07 DIAGNOSIS — D631 Anemia in chronic kidney disease: Secondary | ICD-10-CM | POA: Diagnosis not present

## 2016-09-07 DIAGNOSIS — E1129 Type 2 diabetes mellitus with other diabetic kidney complication: Secondary | ICD-10-CM | POA: Diagnosis not present

## 2016-09-07 DIAGNOSIS — N186 End stage renal disease: Secondary | ICD-10-CM | POA: Diagnosis not present

## 2016-09-07 DIAGNOSIS — N2581 Secondary hyperparathyroidism of renal origin: Secondary | ICD-10-CM | POA: Diagnosis not present

## 2016-09-14 DIAGNOSIS — D631 Anemia in chronic kidney disease: Secondary | ICD-10-CM | POA: Diagnosis not present

## 2016-09-14 DIAGNOSIS — N2581 Secondary hyperparathyroidism of renal origin: Secondary | ICD-10-CM | POA: Diagnosis not present

## 2016-09-14 DIAGNOSIS — N186 End stage renal disease: Secondary | ICD-10-CM | POA: Diagnosis not present

## 2016-09-14 DIAGNOSIS — E1129 Type 2 diabetes mellitus with other diabetic kidney complication: Secondary | ICD-10-CM | POA: Diagnosis not present

## 2016-09-16 DIAGNOSIS — N2581 Secondary hyperparathyroidism of renal origin: Secondary | ICD-10-CM | POA: Diagnosis not present

## 2016-09-16 DIAGNOSIS — D631 Anemia in chronic kidney disease: Secondary | ICD-10-CM | POA: Diagnosis not present

## 2016-09-16 DIAGNOSIS — N186 End stage renal disease: Secondary | ICD-10-CM | POA: Diagnosis not present

## 2016-09-16 DIAGNOSIS — E1129 Type 2 diabetes mellitus with other diabetic kidney complication: Secondary | ICD-10-CM | POA: Diagnosis not present

## 2016-09-19 DIAGNOSIS — N186 End stage renal disease: Secondary | ICD-10-CM | POA: Diagnosis not present

## 2016-09-19 DIAGNOSIS — D631 Anemia in chronic kidney disease: Secondary | ICD-10-CM | POA: Diagnosis not present

## 2016-09-19 DIAGNOSIS — E1129 Type 2 diabetes mellitus with other diabetic kidney complication: Secondary | ICD-10-CM | POA: Diagnosis not present

## 2016-09-19 DIAGNOSIS — N2581 Secondary hyperparathyroidism of renal origin: Secondary | ICD-10-CM | POA: Diagnosis not present

## 2016-09-21 DIAGNOSIS — E1129 Type 2 diabetes mellitus with other diabetic kidney complication: Secondary | ICD-10-CM | POA: Diagnosis not present

## 2016-09-21 DIAGNOSIS — D631 Anemia in chronic kidney disease: Secondary | ICD-10-CM | POA: Diagnosis not present

## 2016-09-21 DIAGNOSIS — N186 End stage renal disease: Secondary | ICD-10-CM | POA: Diagnosis not present

## 2016-09-21 DIAGNOSIS — N2581 Secondary hyperparathyroidism of renal origin: Secondary | ICD-10-CM | POA: Diagnosis not present

## 2016-09-26 DIAGNOSIS — N186 End stage renal disease: Secondary | ICD-10-CM | POA: Diagnosis not present

## 2016-09-26 DIAGNOSIS — D631 Anemia in chronic kidney disease: Secondary | ICD-10-CM | POA: Diagnosis not present

## 2016-09-26 DIAGNOSIS — E1129 Type 2 diabetes mellitus with other diabetic kidney complication: Secondary | ICD-10-CM | POA: Diagnosis not present

## 2016-09-26 DIAGNOSIS — N2581 Secondary hyperparathyroidism of renal origin: Secondary | ICD-10-CM | POA: Diagnosis not present

## 2016-09-28 DIAGNOSIS — N2581 Secondary hyperparathyroidism of renal origin: Secondary | ICD-10-CM | POA: Diagnosis not present

## 2016-09-28 DIAGNOSIS — N186 End stage renal disease: Secondary | ICD-10-CM | POA: Diagnosis not present

## 2016-09-28 DIAGNOSIS — D631 Anemia in chronic kidney disease: Secondary | ICD-10-CM | POA: Diagnosis not present

## 2016-09-28 DIAGNOSIS — E1129 Type 2 diabetes mellitus with other diabetic kidney complication: Secondary | ICD-10-CM | POA: Diagnosis not present

## 2016-10-03 DIAGNOSIS — E1129 Type 2 diabetes mellitus with other diabetic kidney complication: Secondary | ICD-10-CM | POA: Diagnosis not present

## 2016-10-03 DIAGNOSIS — D631 Anemia in chronic kidney disease: Secondary | ICD-10-CM | POA: Diagnosis not present

## 2016-10-03 DIAGNOSIS — N2581 Secondary hyperparathyroidism of renal origin: Secondary | ICD-10-CM | POA: Diagnosis not present

## 2016-10-03 DIAGNOSIS — N186 End stage renal disease: Secondary | ICD-10-CM | POA: Diagnosis not present

## 2016-10-04 DIAGNOSIS — N2581 Secondary hyperparathyroidism of renal origin: Secondary | ICD-10-CM | POA: Diagnosis not present

## 2016-10-04 DIAGNOSIS — Z992 Dependence on renal dialysis: Secondary | ICD-10-CM | POA: Diagnosis not present

## 2016-10-04 DIAGNOSIS — E1122 Type 2 diabetes mellitus with diabetic chronic kidney disease: Secondary | ICD-10-CM | POA: Diagnosis not present

## 2016-10-04 DIAGNOSIS — N186 End stage renal disease: Secondary | ICD-10-CM | POA: Diagnosis not present

## 2016-10-05 DIAGNOSIS — N2581 Secondary hyperparathyroidism of renal origin: Secondary | ICD-10-CM | POA: Diagnosis not present

## 2016-10-05 DIAGNOSIS — D509 Iron deficiency anemia, unspecified: Secondary | ICD-10-CM | POA: Diagnosis not present

## 2016-10-05 DIAGNOSIS — E784 Other hyperlipidemia: Secondary | ICD-10-CM | POA: Diagnosis not present

## 2016-10-05 DIAGNOSIS — N2589 Other disorders resulting from impaired renal tubular function: Secondary | ICD-10-CM | POA: Diagnosis not present

## 2016-10-05 DIAGNOSIS — K7689 Other specified diseases of liver: Secondary | ICD-10-CM | POA: Diagnosis not present

## 2016-10-05 DIAGNOSIS — E8779 Other fluid overload: Secondary | ICD-10-CM | POA: Diagnosis not present

## 2016-10-05 DIAGNOSIS — N186 End stage renal disease: Secondary | ICD-10-CM | POA: Diagnosis not present

## 2016-10-05 DIAGNOSIS — R8299 Other abnormal findings in urine: Secondary | ICD-10-CM | POA: Diagnosis not present

## 2016-10-05 DIAGNOSIS — D630 Anemia in neoplastic disease: Secondary | ICD-10-CM | POA: Diagnosis not present

## 2016-10-05 DIAGNOSIS — D631 Anemia in chronic kidney disease: Secondary | ICD-10-CM | POA: Diagnosis not present

## 2016-10-06 DIAGNOSIS — D631 Anemia in chronic kidney disease: Secondary | ICD-10-CM | POA: Diagnosis not present

## 2016-10-06 DIAGNOSIS — K7689 Other specified diseases of liver: Secondary | ICD-10-CM | POA: Diagnosis not present

## 2016-10-06 DIAGNOSIS — D509 Iron deficiency anemia, unspecified: Secondary | ICD-10-CM | POA: Diagnosis not present

## 2016-10-06 DIAGNOSIS — N2589 Other disorders resulting from impaired renal tubular function: Secondary | ICD-10-CM | POA: Diagnosis not present

## 2016-10-06 DIAGNOSIS — N186 End stage renal disease: Secondary | ICD-10-CM | POA: Diagnosis not present

## 2016-10-06 DIAGNOSIS — N2581 Secondary hyperparathyroidism of renal origin: Secondary | ICD-10-CM | POA: Diagnosis not present

## 2016-10-07 DIAGNOSIS — D509 Iron deficiency anemia, unspecified: Secondary | ICD-10-CM | POA: Diagnosis not present

## 2016-10-07 DIAGNOSIS — N186 End stage renal disease: Secondary | ICD-10-CM | POA: Diagnosis not present

## 2016-10-07 DIAGNOSIS — N2581 Secondary hyperparathyroidism of renal origin: Secondary | ICD-10-CM | POA: Diagnosis not present

## 2016-10-07 DIAGNOSIS — K7689 Other specified diseases of liver: Secondary | ICD-10-CM | POA: Diagnosis not present

## 2016-10-07 DIAGNOSIS — N2589 Other disorders resulting from impaired renal tubular function: Secondary | ICD-10-CM | POA: Diagnosis not present

## 2016-10-07 DIAGNOSIS — D631 Anemia in chronic kidney disease: Secondary | ICD-10-CM | POA: Diagnosis not present

## 2016-10-10 DIAGNOSIS — N186 End stage renal disease: Secondary | ICD-10-CM | POA: Diagnosis not present

## 2016-10-10 DIAGNOSIS — D631 Anemia in chronic kidney disease: Secondary | ICD-10-CM | POA: Diagnosis not present

## 2016-10-10 DIAGNOSIS — K7689 Other specified diseases of liver: Secondary | ICD-10-CM | POA: Diagnosis not present

## 2016-10-10 DIAGNOSIS — D509 Iron deficiency anemia, unspecified: Secondary | ICD-10-CM | POA: Diagnosis not present

## 2016-10-10 DIAGNOSIS — N2589 Other disorders resulting from impaired renal tubular function: Secondary | ICD-10-CM | POA: Diagnosis not present

## 2016-10-10 DIAGNOSIS — N2581 Secondary hyperparathyroidism of renal origin: Secondary | ICD-10-CM | POA: Diagnosis not present

## 2016-10-11 DIAGNOSIS — N2589 Other disorders resulting from impaired renal tubular function: Secondary | ICD-10-CM | POA: Diagnosis not present

## 2016-10-11 DIAGNOSIS — D509 Iron deficiency anemia, unspecified: Secondary | ICD-10-CM | POA: Diagnosis not present

## 2016-10-11 DIAGNOSIS — K7689 Other specified diseases of liver: Secondary | ICD-10-CM | POA: Diagnosis not present

## 2016-10-11 DIAGNOSIS — D631 Anemia in chronic kidney disease: Secondary | ICD-10-CM | POA: Diagnosis not present

## 2016-10-11 DIAGNOSIS — N186 End stage renal disease: Secondary | ICD-10-CM | POA: Diagnosis not present

## 2016-10-11 DIAGNOSIS — N2581 Secondary hyperparathyroidism of renal origin: Secondary | ICD-10-CM | POA: Diagnosis not present

## 2016-10-13 DIAGNOSIS — K7689 Other specified diseases of liver: Secondary | ICD-10-CM | POA: Diagnosis not present

## 2016-10-13 DIAGNOSIS — N186 End stage renal disease: Secondary | ICD-10-CM | POA: Diagnosis not present

## 2016-10-13 DIAGNOSIS — N2589 Other disorders resulting from impaired renal tubular function: Secondary | ICD-10-CM | POA: Diagnosis not present

## 2016-10-13 DIAGNOSIS — N2581 Secondary hyperparathyroidism of renal origin: Secondary | ICD-10-CM | POA: Diagnosis not present

## 2016-10-13 DIAGNOSIS — D509 Iron deficiency anemia, unspecified: Secondary | ICD-10-CM | POA: Diagnosis not present

## 2016-10-13 DIAGNOSIS — D631 Anemia in chronic kidney disease: Secondary | ICD-10-CM | POA: Diagnosis not present

## 2016-10-14 DIAGNOSIS — K7689 Other specified diseases of liver: Secondary | ICD-10-CM | POA: Diagnosis not present

## 2016-10-14 DIAGNOSIS — N2589 Other disorders resulting from impaired renal tubular function: Secondary | ICD-10-CM | POA: Diagnosis not present

## 2016-10-14 DIAGNOSIS — D631 Anemia in chronic kidney disease: Secondary | ICD-10-CM | POA: Diagnosis not present

## 2016-10-14 DIAGNOSIS — D509 Iron deficiency anemia, unspecified: Secondary | ICD-10-CM | POA: Diagnosis not present

## 2016-10-14 DIAGNOSIS — N2581 Secondary hyperparathyroidism of renal origin: Secondary | ICD-10-CM | POA: Diagnosis not present

## 2016-10-14 DIAGNOSIS — N186 End stage renal disease: Secondary | ICD-10-CM | POA: Diagnosis not present

## 2016-10-17 DIAGNOSIS — N2589 Other disorders resulting from impaired renal tubular function: Secondary | ICD-10-CM | POA: Diagnosis not present

## 2016-10-17 DIAGNOSIS — D509 Iron deficiency anemia, unspecified: Secondary | ICD-10-CM | POA: Diagnosis not present

## 2016-10-17 DIAGNOSIS — N2581 Secondary hyperparathyroidism of renal origin: Secondary | ICD-10-CM | POA: Diagnosis not present

## 2016-10-17 DIAGNOSIS — K7689 Other specified diseases of liver: Secondary | ICD-10-CM | POA: Diagnosis not present

## 2016-10-17 DIAGNOSIS — D631 Anemia in chronic kidney disease: Secondary | ICD-10-CM | POA: Diagnosis not present

## 2016-10-17 DIAGNOSIS — N186 End stage renal disease: Secondary | ICD-10-CM | POA: Diagnosis not present

## 2016-10-18 DIAGNOSIS — K7689 Other specified diseases of liver: Secondary | ICD-10-CM | POA: Diagnosis not present

## 2016-10-18 DIAGNOSIS — D509 Iron deficiency anemia, unspecified: Secondary | ICD-10-CM | POA: Diagnosis not present

## 2016-10-18 DIAGNOSIS — N2581 Secondary hyperparathyroidism of renal origin: Secondary | ICD-10-CM | POA: Diagnosis not present

## 2016-10-18 DIAGNOSIS — N186 End stage renal disease: Secondary | ICD-10-CM | POA: Diagnosis not present

## 2016-10-18 DIAGNOSIS — N2589 Other disorders resulting from impaired renal tubular function: Secondary | ICD-10-CM | POA: Diagnosis not present

## 2016-10-18 DIAGNOSIS — D631 Anemia in chronic kidney disease: Secondary | ICD-10-CM | POA: Diagnosis not present

## 2016-10-19 DIAGNOSIS — N2589 Other disorders resulting from impaired renal tubular function: Secondary | ICD-10-CM | POA: Diagnosis not present

## 2016-10-19 DIAGNOSIS — N2581 Secondary hyperparathyroidism of renal origin: Secondary | ICD-10-CM | POA: Diagnosis not present

## 2016-10-19 DIAGNOSIS — D631 Anemia in chronic kidney disease: Secondary | ICD-10-CM | POA: Diagnosis not present

## 2016-10-19 DIAGNOSIS — N186 End stage renal disease: Secondary | ICD-10-CM | POA: Diagnosis not present

## 2016-10-19 DIAGNOSIS — D509 Iron deficiency anemia, unspecified: Secondary | ICD-10-CM | POA: Diagnosis not present

## 2016-10-19 DIAGNOSIS — K7689 Other specified diseases of liver: Secondary | ICD-10-CM | POA: Diagnosis not present

## 2016-10-20 DIAGNOSIS — D509 Iron deficiency anemia, unspecified: Secondary | ICD-10-CM | POA: Diagnosis not present

## 2016-10-20 DIAGNOSIS — K7689 Other specified diseases of liver: Secondary | ICD-10-CM | POA: Diagnosis not present

## 2016-10-20 DIAGNOSIS — N2589 Other disorders resulting from impaired renal tubular function: Secondary | ICD-10-CM | POA: Diagnosis not present

## 2016-10-20 DIAGNOSIS — D631 Anemia in chronic kidney disease: Secondary | ICD-10-CM | POA: Diagnosis not present

## 2016-10-20 DIAGNOSIS — N186 End stage renal disease: Secondary | ICD-10-CM | POA: Diagnosis not present

## 2016-10-20 DIAGNOSIS — N2581 Secondary hyperparathyroidism of renal origin: Secondary | ICD-10-CM | POA: Diagnosis not present

## 2016-10-21 DIAGNOSIS — N2581 Secondary hyperparathyroidism of renal origin: Secondary | ICD-10-CM | POA: Diagnosis not present

## 2016-10-21 DIAGNOSIS — N2589 Other disorders resulting from impaired renal tubular function: Secondary | ICD-10-CM | POA: Diagnosis not present

## 2016-10-21 DIAGNOSIS — K7689 Other specified diseases of liver: Secondary | ICD-10-CM | POA: Diagnosis not present

## 2016-10-21 DIAGNOSIS — N186 End stage renal disease: Secondary | ICD-10-CM | POA: Diagnosis not present

## 2016-10-21 DIAGNOSIS — D631 Anemia in chronic kidney disease: Secondary | ICD-10-CM | POA: Diagnosis not present

## 2016-10-21 DIAGNOSIS — D509 Iron deficiency anemia, unspecified: Secondary | ICD-10-CM | POA: Diagnosis not present

## 2016-10-24 DIAGNOSIS — K7689 Other specified diseases of liver: Secondary | ICD-10-CM | POA: Diagnosis not present

## 2016-10-24 DIAGNOSIS — D631 Anemia in chronic kidney disease: Secondary | ICD-10-CM | POA: Diagnosis not present

## 2016-10-24 DIAGNOSIS — N2589 Other disorders resulting from impaired renal tubular function: Secondary | ICD-10-CM | POA: Diagnosis not present

## 2016-10-24 DIAGNOSIS — N2581 Secondary hyperparathyroidism of renal origin: Secondary | ICD-10-CM | POA: Diagnosis not present

## 2016-10-24 DIAGNOSIS — D509 Iron deficiency anemia, unspecified: Secondary | ICD-10-CM | POA: Diagnosis not present

## 2016-10-24 DIAGNOSIS — N186 End stage renal disease: Secondary | ICD-10-CM | POA: Diagnosis not present

## 2016-10-25 DIAGNOSIS — D631 Anemia in chronic kidney disease: Secondary | ICD-10-CM | POA: Diagnosis not present

## 2016-10-25 DIAGNOSIS — N186 End stage renal disease: Secondary | ICD-10-CM | POA: Diagnosis not present

## 2016-10-25 DIAGNOSIS — K7689 Other specified diseases of liver: Secondary | ICD-10-CM | POA: Diagnosis not present

## 2016-10-25 DIAGNOSIS — N2589 Other disorders resulting from impaired renal tubular function: Secondary | ICD-10-CM | POA: Diagnosis not present

## 2016-10-25 DIAGNOSIS — N2581 Secondary hyperparathyroidism of renal origin: Secondary | ICD-10-CM | POA: Diagnosis not present

## 2016-10-25 DIAGNOSIS — D509 Iron deficiency anemia, unspecified: Secondary | ICD-10-CM | POA: Diagnosis not present

## 2016-10-27 DIAGNOSIS — D509 Iron deficiency anemia, unspecified: Secondary | ICD-10-CM | POA: Diagnosis not present

## 2016-10-27 DIAGNOSIS — K7689 Other specified diseases of liver: Secondary | ICD-10-CM | POA: Diagnosis not present

## 2016-10-27 DIAGNOSIS — N2581 Secondary hyperparathyroidism of renal origin: Secondary | ICD-10-CM | POA: Diagnosis not present

## 2016-10-27 DIAGNOSIS — N186 End stage renal disease: Secondary | ICD-10-CM | POA: Diagnosis not present

## 2016-10-27 DIAGNOSIS — D631 Anemia in chronic kidney disease: Secondary | ICD-10-CM | POA: Diagnosis not present

## 2016-10-27 DIAGNOSIS — N2589 Other disorders resulting from impaired renal tubular function: Secondary | ICD-10-CM | POA: Diagnosis not present

## 2016-10-28 ENCOUNTER — Emergency Department (HOSPITAL_COMMUNITY): Payer: Medicare Other

## 2016-10-28 ENCOUNTER — Emergency Department (HOSPITAL_COMMUNITY)
Admission: EM | Admit: 2016-10-28 | Discharge: 2016-10-29 | Disposition: A | Discharge status: Home / Self Care | Payer: Medicare Other | Attending: Emergency Medicine | Admitting: Emergency Medicine

## 2016-10-28 ENCOUNTER — Encounter (HOSPITAL_COMMUNITY): Payer: Self-pay | Admitting: Emergency Medicine

## 2016-10-28 DIAGNOSIS — K7689 Other specified diseases of liver: Secondary | ICD-10-CM | POA: Diagnosis not present

## 2016-10-28 DIAGNOSIS — D631 Anemia in chronic kidney disease: Secondary | ICD-10-CM | POA: Diagnosis not present

## 2016-10-28 DIAGNOSIS — R739 Hyperglycemia, unspecified: Secondary | ICD-10-CM

## 2016-10-28 DIAGNOSIS — J810 Acute pulmonary edema: Secondary | ICD-10-CM | POA: Insufficient documentation

## 2016-10-28 DIAGNOSIS — Z992 Dependence on renal dialysis: Secondary | ICD-10-CM | POA: Diagnosis not present

## 2016-10-28 DIAGNOSIS — D509 Iron deficiency anemia, unspecified: Secondary | ICD-10-CM | POA: Diagnosis not present

## 2016-10-28 DIAGNOSIS — Z79899 Other long term (current) drug therapy: Secondary | ICD-10-CM | POA: Insufficient documentation

## 2016-10-28 DIAGNOSIS — N2589 Other disorders resulting from impaired renal tubular function: Secondary | ICD-10-CM | POA: Diagnosis not present

## 2016-10-28 DIAGNOSIS — E1165 Type 2 diabetes mellitus with hyperglycemia: Secondary | ICD-10-CM | POA: Diagnosis not present

## 2016-10-28 DIAGNOSIS — D649 Anemia, unspecified: Secondary | ICD-10-CM | POA: Diagnosis not present

## 2016-10-28 DIAGNOSIS — N2581 Secondary hyperparathyroidism of renal origin: Secondary | ICD-10-CM | POA: Diagnosis not present

## 2016-10-28 DIAGNOSIS — I120 Hypertensive chronic kidney disease with stage 5 chronic kidney disease or end stage renal disease: Secondary | ICD-10-CM | POA: Insufficient documentation

## 2016-10-28 DIAGNOSIS — E1021 Type 1 diabetes mellitus with diabetic nephropathy: Secondary | ICD-10-CM | POA: Diagnosis not present

## 2016-10-28 DIAGNOSIS — N186 End stage renal disease: Secondary | ICD-10-CM | POA: Diagnosis not present

## 2016-10-28 DIAGNOSIS — R0602 Shortness of breath: Secondary | ICD-10-CM | POA: Diagnosis not present

## 2016-10-28 LAB — CBC WITH DIFFERENTIAL/PLATELET
Basophils Absolute: 0 10*3/uL (ref 0.0–0.1)
Basophils Relative: 0 %
EOS ABS: 0.1 10*3/uL (ref 0.0–0.7)
EOS PCT: 1 %
HCT: 29.8 % — ABNORMAL LOW (ref 39.0–52.0)
Hemoglobin: 9.5 g/dL — ABNORMAL LOW (ref 13.0–17.0)
LYMPHS ABS: 1.4 10*3/uL (ref 0.7–4.0)
Lymphocytes Relative: 29 %
MCH: 28.1 pg (ref 26.0–34.0)
MCHC: 31.9 g/dL (ref 30.0–36.0)
MCV: 88.2 fL (ref 78.0–100.0)
Monocytes Absolute: 0.2 10*3/uL (ref 0.1–1.0)
Monocytes Relative: 5 %
Neutro Abs: 3.1 10*3/uL (ref 1.7–7.7)
Neutrophils Relative %: 65 %
Platelets: 240 10*3/uL (ref 150–400)
RBC: 3.38 MIL/uL — AB (ref 4.22–5.81)
RDW: 15.1 % (ref 11.5–15.5)
WBC: 4.8 10*3/uL (ref 4.0–10.5)

## 2016-10-28 LAB — I-STAT ARTERIAL BLOOD GAS, ED
ACID-BASE DEFICIT: 2 mmol/L (ref 0.0–2.0)
Bicarbonate: 22 mmol/L (ref 20.0–28.0)
O2 SAT: 96 %
PH ART: 7.431 (ref 7.350–7.450)
PO2 ART: 75 mmHg — AB (ref 83.0–108.0)
TCO2: 23 mmol/L (ref 22–32)
pCO2 arterial: 33 mmHg (ref 32.0–48.0)

## 2016-10-28 LAB — COMPREHENSIVE METABOLIC PANEL
ALBUMIN: 3.1 g/dL — AB (ref 3.5–5.0)
ALT: 25 U/L (ref 17–63)
AST: 33 U/L (ref 15–41)
Alkaline Phosphatase: 100 U/L (ref 38–126)
Anion gap: 10 (ref 5–15)
BUN: 36 mg/dL — ABNORMAL HIGH (ref 6–20)
CALCIUM: 7.4 mg/dL — AB (ref 8.9–10.3)
CHLORIDE: 100 mmol/L — AB (ref 101–111)
CO2: 22 mmol/L (ref 22–32)
CREATININE: 12.16 mg/dL — AB (ref 0.61–1.24)
GFR calc Af Amer: 5 mL/min — ABNORMAL LOW (ref >60)
GFR calc non Af Amer: 5 mL/min — ABNORMAL LOW (ref >60)
GLUCOSE: 552 mg/dL — AB (ref 65–99)
Potassium: 5 mmol/L (ref 3.5–5.1)
SODIUM: 132 mmol/L — AB (ref 135–145)
Total Bilirubin: 0.8 mg/dL (ref 0.3–1.2)
Total Protein: 6.5 g/dL (ref 6.5–8.1)

## 2016-10-28 LAB — CBG MONITORING, ED
Glucose-Capillary: >600 mg/dL (ref 65–99)
Glucose-Capillary: >600 mg/dL (ref 65–99)

## 2016-10-28 LAB — ETHANOL

## 2016-10-28 MED ORDER — DEXTROSE-NACL 5-0.45 % IV SOLN: INTRAVENOUS | Status: DC

## 2016-10-28 MED ORDER — SODIUM CHLORIDE 0.9 % IV SOLN
INTRAVENOUS | Status: DC
  Administered 2016-10-29: 5.4 [IU]/h via INTRAVENOUS
  Filled 2016-10-28: qty 1

## 2016-10-28 NOTE — ED Notes (Signed)
RT at bedside getting ABG

## 2016-10-28 NOTE — ED Notes (Signed)
Blood draw by phleb [email or web address].

## 2016-10-28 NOTE — ED Notes (Signed)
Two Unsuccessful attempts at gaining IV access.
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
## 2016-10-28 NOTE — ED Notes (Signed)
Pt placed on 2L of O2. Oxygen dropped to 89% RA.

## 2016-10-28 NOTE — ED Notes (Signed)
Pt hard to arouse even with sternal stimulation. Will not keep eyes open for even a second.  EDP made aware.
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
## 2016-10-28 NOTE — ED Notes (Signed)
Patient states he doesn't produce much urine. States he is unable to urinate at this time.

## 2016-10-28 NOTE — ED Notes (Addendum)
Nurse starting IV and will draw labs. 

## 2016-10-29 DIAGNOSIS — J810 Acute pulmonary edema: Secondary | ICD-10-CM | POA: Diagnosis not present

## 2016-10-29 LAB — CBG MONITORING, ED
Glucose-Capillary: 493 mg/dL — ABNORMAL HIGH (ref 65–99)
Glucose-Capillary: >600 mg/dL (ref 65–99)

## 2016-10-29 MED ORDER — CALCIUM ACETATE (PHOS BINDER) 667 MG PO CAPS: 1334.0000 mg | ORAL_CAPSULE | ORAL | Status: DC

## 2016-10-29 MED ORDER — FLUOXETINE HCL 20 MG PO CAPS: 20.0000 mg | ORAL_CAPSULE | Freq: Every day | ORAL | Status: DC

## 2016-10-29 MED ORDER — FUROSEMIDE 10 MG/ML IJ SOLN
80.0000 mg | Freq: Once | INTRAMUSCULAR | Status: DC
  Filled 2016-10-29: qty 8

## 2016-10-29 MED ORDER — HYDROXYZINE HCL 25 MG PO TABS: 25.0000 mg | ORAL_TABLET | Freq: Every evening | ORAL | Status: DC | PRN

## 2016-10-29 MED ORDER — RENA-VITE PO TABS
1.0000 | ORAL_TABLET | Freq: Every day | ORAL | Status: DC
Start: 2016-10-29 — End: 2016-10-29

## 2016-10-29 MED ORDER — HYDRALAZINE HCL 20 MG/ML IJ SOLN: 10.0000 mg | INTRAMUSCULAR | Status: DC | PRN

## 2016-10-29 NOTE — Significant Event (Signed)
Patient asking to leave AMA.  Patient seen at bedside.  Plan was to admit patient for dialysis in AM.  Patient does have SOB, pulmonary edema on CXR, and HTN; however, patient is not requiring BIPAP nor NTG gtt, is stable and satting 100% on oxygen via [place].  Prior to my seeing the patient.  Dr. Tamera Punt called and spoke with Dr. Jimmy Footman, who felt that patient could be dialyzed in AM.  I dont see anything on labs or in talking with patient that makes me disagree with Dr. Mollie Germany or Dr. Deterding's evaluation / plan.  Patient is very unhappy with plan, wants dialysis to be done right now, is unhappy that he wasn't told that dialysis wasn't routinely done in the middle of the night earlier.  And demands to leave AMA.  He says he will go to a dialysis center later this morning.  If he changes his mind and wants to stay for dialysis this morning, Ill be glad to admit him.

## 2016-10-29 NOTE — ED Notes (Signed)
Delay in lab draw,  MD at bedside. 

## 2016-10-29 NOTE — ED Notes (Addendum)
Admitting MD notified of pt's wish to leave.  Admitting MD Alcario Drought in to advise pt that of process and that he was to be admitted to be dialyzed in the morning.  Nephrology had been consulted by the EDP earlier in the night and admitting MD attempted to explain this to pt.  Pt did not appear short of breath at this time.  Pt was loud and was not able to be deescalated.   Pt adamant that he is leaving.  Pt states "you did nothing for me"  "this is a terrible hospital"  Pt states he is leaving.  RN removed IV and monitor equipment and tried to assist pt by handing him his clothing. Pt stated "I do not need you to help me do what I can do, I needed you to help me with what I couldn't.  Please get out of my room".  RN left room.  Pt left room and asked for the way out.  Refusal to sign AMA form.  Admitting MD nearby and updated.

## 2016-10-29 NOTE — ED Notes (Signed)
Called to room for pt to report cramping.  RN notified admitting MD. Labs ordered.  RN went back to room to follow up with pt and update him.  Pt states he wants to be unhooked from everything and leave because "he came here for dialysis and he hasn't gotten that".

## 2016-10-29 NOTE — ED Notes (Signed)
Per nurse,  Pt leaving AMA.

## 2016-10-31 DIAGNOSIS — N186 End stage renal disease: Secondary | ICD-10-CM | POA: Diagnosis not present

## 2016-10-31 DIAGNOSIS — N2589 Other disorders resulting from impaired renal tubular function: Secondary | ICD-10-CM | POA: Diagnosis not present

## 2016-10-31 DIAGNOSIS — N2581 Secondary hyperparathyroidism of renal origin: Secondary | ICD-10-CM | POA: Diagnosis not present

## 2016-10-31 DIAGNOSIS — K7689 Other specified diseases of liver: Secondary | ICD-10-CM | POA: Diagnosis not present

## 2016-10-31 DIAGNOSIS — D631 Anemia in chronic kidney disease: Secondary | ICD-10-CM | POA: Diagnosis not present

## 2016-10-31 DIAGNOSIS — D509 Iron deficiency anemia, unspecified: Secondary | ICD-10-CM | POA: Diagnosis not present

## 2016-11-01 DIAGNOSIS — E8779 Other fluid overload: Secondary | ICD-10-CM | POA: Diagnosis not present

## 2016-11-01 DIAGNOSIS — N186 End stage renal disease: Secondary | ICD-10-CM | POA: Diagnosis not present

## 2016-11-01 DIAGNOSIS — D509 Iron deficiency anemia, unspecified: Secondary | ICD-10-CM | POA: Diagnosis not present

## 2016-11-02 DIAGNOSIS — N186 End stage renal disease: Secondary | ICD-10-CM | POA: Diagnosis not present

## 2016-11-02 DIAGNOSIS — E8779 Other fluid overload: Secondary | ICD-10-CM | POA: Diagnosis not present

## 2016-11-02 DIAGNOSIS — D509 Iron deficiency anemia, unspecified: Secondary | ICD-10-CM | POA: Diagnosis not present

## 2016-11-04 DIAGNOSIS — Z992 Dependence on renal dialysis: Secondary | ICD-10-CM | POA: Diagnosis not present

## 2016-11-04 DIAGNOSIS — N186 End stage renal disease: Secondary | ICD-10-CM | POA: Diagnosis not present

## 2016-11-04 DIAGNOSIS — E1122 Type 2 diabetes mellitus with diabetic chronic kidney disease: Secondary | ICD-10-CM | POA: Diagnosis not present

## 2016-11-07 DIAGNOSIS — N2581 Secondary hyperparathyroidism of renal origin: Secondary | ICD-10-CM | POA: Diagnosis not present

## 2016-11-07 DIAGNOSIS — D509 Iron deficiency anemia, unspecified: Secondary | ICD-10-CM | POA: Diagnosis not present

## 2016-11-07 DIAGNOSIS — D631 Anemia in chronic kidney disease: Secondary | ICD-10-CM | POA: Diagnosis not present

## 2016-11-07 DIAGNOSIS — N186 End stage renal disease: Secondary | ICD-10-CM | POA: Diagnosis not present

## 2016-11-10 DIAGNOSIS — D509 Iron deficiency anemia, unspecified: Secondary | ICD-10-CM | POA: Diagnosis not present

## 2016-11-10 DIAGNOSIS — D631 Anemia in chronic kidney disease: Secondary | ICD-10-CM | POA: Diagnosis not present

## 2016-11-10 DIAGNOSIS — N186 End stage renal disease: Secondary | ICD-10-CM | POA: Diagnosis not present

## 2016-11-10 DIAGNOSIS — N2581 Secondary hyperparathyroidism of renal origin: Secondary | ICD-10-CM | POA: Diagnosis not present

## 2016-11-12 DIAGNOSIS — N2581 Secondary hyperparathyroidism of renal origin: Secondary | ICD-10-CM | POA: Diagnosis not present

## 2016-11-12 DIAGNOSIS — D509 Iron deficiency anemia, unspecified: Secondary | ICD-10-CM | POA: Diagnosis not present

## 2016-11-12 DIAGNOSIS — D631 Anemia in chronic kidney disease: Secondary | ICD-10-CM | POA: Diagnosis not present

## 2016-11-12 DIAGNOSIS — N186 End stage renal disease: Secondary | ICD-10-CM | POA: Diagnosis not present

## 2016-11-15 DIAGNOSIS — N186 End stage renal disease: Secondary | ICD-10-CM | POA: Diagnosis not present

## 2016-11-15 DIAGNOSIS — N2581 Secondary hyperparathyroidism of renal origin: Secondary | ICD-10-CM | POA: Diagnosis not present

## 2016-11-15 DIAGNOSIS — D509 Iron deficiency anemia, unspecified: Secondary | ICD-10-CM | POA: Diagnosis not present

## 2016-11-15 DIAGNOSIS — D631 Anemia in chronic kidney disease: Secondary | ICD-10-CM | POA: Diagnosis not present

## 2016-11-17 ENCOUNTER — Emergency Department (HOSPITAL_COMMUNITY)
Admission: EM | Admit: 2016-11-17 | Discharge: 2016-11-17 | Disposition: A | Discharge status: Home / Self Care | Payer: Medicare Other | Attending: Emergency Medicine | Admitting: Emergency Medicine

## 2016-11-17 ENCOUNTER — Encounter (HOSPITAL_COMMUNITY): Payer: Self-pay | Admitting: Emergency Medicine

## 2016-11-17 DIAGNOSIS — N186 End stage renal disease: Secondary | ICD-10-CM | POA: Diagnosis not present

## 2016-11-17 DIAGNOSIS — E10649 Type 1 diabetes mellitus with hypoglycemia without coma: Secondary | ICD-10-CM | POA: Insufficient documentation

## 2016-11-17 DIAGNOSIS — Y9389 Activity, other specified: Secondary | ICD-10-CM | POA: Diagnosis not present

## 2016-11-17 DIAGNOSIS — Z79899 Other long term (current) drug therapy: Secondary | ICD-10-CM | POA: Diagnosis not present

## 2016-11-17 DIAGNOSIS — Y92410 Unspecified street and highway as the place of occurrence of the external cause: Secondary | ICD-10-CM | POA: Insufficient documentation

## 2016-11-17 DIAGNOSIS — E1022 Type 1 diabetes mellitus with diabetic chronic kidney disease: Secondary | ICD-10-CM | POA: Diagnosis not present

## 2016-11-17 DIAGNOSIS — Y999 Unspecified external cause status: Secondary | ICD-10-CM | POA: Insufficient documentation

## 2016-11-17 DIAGNOSIS — Z992 Dependence on renal dialysis: Secondary | ICD-10-CM | POA: Diagnosis not present

## 2016-11-17 DIAGNOSIS — E11649 Type 2 diabetes mellitus with hypoglycemia without coma: Secondary | ICD-10-CM | POA: Diagnosis not present

## 2016-11-17 DIAGNOSIS — R531 Weakness: Secondary | ICD-10-CM | POA: Diagnosis not present

## 2016-11-17 DIAGNOSIS — I120 Hypertensive chronic kidney disease with stage 5 chronic kidney disease or end stage renal disease: Secondary | ICD-10-CM | POA: Insufficient documentation

## 2016-11-17 DIAGNOSIS — E161 Other hypoglycemia: Secondary | ICD-10-CM | POA: Diagnosis not present

## 2016-11-17 DIAGNOSIS — Z794 Long term (current) use of insulin: Secondary | ICD-10-CM | POA: Insufficient documentation

## 2016-11-17 DIAGNOSIS — E162 Hypoglycemia, unspecified: Secondary | ICD-10-CM | POA: Diagnosis not present

## 2016-11-17 LAB — POTASSIUM: POTASSIUM: 5.9 mmol/L — AB (ref 3.5–5.1)

## 2016-11-17 LAB — CBG MONITORING, ED
GLUCOSE-CAPILLARY: 222 mg/dL — AB (ref 65–99)
GLUCOSE-CAPILLARY: 317 mg/dL — AB (ref 65–99)
GLUCOSE-CAPILLARY: 331 mg/dL — AB (ref 65–99)
Glucose-Capillary: 116 mg/dL — ABNORMAL HIGH (ref 65–99)

## 2016-11-17 MED ORDER — HYDRALAZINE HCL 25 MG PO TABS
25.0000 mg | ORAL_TABLET | Freq: Once | ORAL | Status: AC
  Administered 2016-11-17: 25 mg via ORAL
  Filled 2016-11-17: qty 1

## 2016-11-17 MED ORDER — HYDRALAZINE HCL 25 MG PO TABS: 50.0000 mg | ORAL_TABLET | Freq: Three times a day (TID) | ORAL | 0 refills | Status: DC

## 2016-11-17 NOTE — ED Notes (Signed)
IV team paged to acquire wait time of IV start/blood draw.

## 2016-11-17 NOTE — ED Notes (Addendum)
Maria verbalizes both phlebotomist have attempted blood draw with no success. IV team consult placed.
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
## 2016-11-17 NOTE — ED Notes (Signed)
Collected blood draw with US guided IV related to difficult stick/blood draw.
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
## 2016-11-17 NOTE — ED Notes (Signed)
Edward Colon in main lab phlebotomy verbalizes will attempt blood draw. Per lab previous sample clotted.

## 2016-11-17 NOTE — ED Notes (Signed)
IV team able to successful IV insert. Light green, lavender, and dark green tubes sent to lab. Spoke with Ovid Curd in main lab verbalizes potassium in process now.
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
## 2016-11-19 DIAGNOSIS — D509 Iron deficiency anemia, unspecified: Secondary | ICD-10-CM | POA: Diagnosis not present

## 2016-11-19 DIAGNOSIS — N186 End stage renal disease: Secondary | ICD-10-CM | POA: Diagnosis not present

## 2016-11-19 DIAGNOSIS — D631 Anemia in chronic kidney disease: Secondary | ICD-10-CM | POA: Diagnosis not present

## 2016-11-19 DIAGNOSIS — N2581 Secondary hyperparathyroidism of renal origin: Secondary | ICD-10-CM | POA: Diagnosis not present

## 2016-11-21 DIAGNOSIS — N186 End stage renal disease: Secondary | ICD-10-CM | POA: Diagnosis not present

## 2016-11-21 DIAGNOSIS — D509 Iron deficiency anemia, unspecified: Secondary | ICD-10-CM | POA: Diagnosis not present

## 2016-11-21 DIAGNOSIS — D631 Anemia in chronic kidney disease: Secondary | ICD-10-CM | POA: Diagnosis not present

## 2016-11-21 DIAGNOSIS — N2581 Secondary hyperparathyroidism of renal origin: Secondary | ICD-10-CM | POA: Diagnosis not present

## 2016-11-24 DIAGNOSIS — N2581 Secondary hyperparathyroidism of renal origin: Secondary | ICD-10-CM | POA: Diagnosis not present

## 2016-11-24 DIAGNOSIS — D631 Anemia in chronic kidney disease: Secondary | ICD-10-CM | POA: Diagnosis not present

## 2016-11-24 DIAGNOSIS — D509 Iron deficiency anemia, unspecified: Secondary | ICD-10-CM | POA: Diagnosis not present

## 2016-11-24 DIAGNOSIS — N186 End stage renal disease: Secondary | ICD-10-CM | POA: Diagnosis not present

## 2016-11-26 DIAGNOSIS — D509 Iron deficiency anemia, unspecified: Secondary | ICD-10-CM | POA: Diagnosis not present

## 2016-11-26 DIAGNOSIS — D631 Anemia in chronic kidney disease: Secondary | ICD-10-CM | POA: Diagnosis not present

## 2016-11-26 DIAGNOSIS — N186 End stage renal disease: Secondary | ICD-10-CM | POA: Diagnosis not present

## 2016-11-26 DIAGNOSIS — N2581 Secondary hyperparathyroidism of renal origin: Secondary | ICD-10-CM | POA: Diagnosis not present

## 2016-11-29 DIAGNOSIS — N2581 Secondary hyperparathyroidism of renal origin: Secondary | ICD-10-CM | POA: Diagnosis not present

## 2016-11-29 DIAGNOSIS — D509 Iron deficiency anemia, unspecified: Secondary | ICD-10-CM | POA: Diagnosis not present

## 2016-11-29 DIAGNOSIS — N186 End stage renal disease: Secondary | ICD-10-CM | POA: Diagnosis not present

## 2016-11-29 DIAGNOSIS — D631 Anemia in chronic kidney disease: Secondary | ICD-10-CM | POA: Diagnosis not present

## 2016-12-01 DIAGNOSIS — D631 Anemia in chronic kidney disease: Secondary | ICD-10-CM | POA: Diagnosis not present

## 2016-12-01 DIAGNOSIS — N186 End stage renal disease: Secondary | ICD-10-CM | POA: Diagnosis not present

## 2016-12-01 DIAGNOSIS — D509 Iron deficiency anemia, unspecified: Secondary | ICD-10-CM | POA: Diagnosis not present

## 2016-12-01 DIAGNOSIS — N2581 Secondary hyperparathyroidism of renal origin: Secondary | ICD-10-CM | POA: Diagnosis not present

## 2016-12-03 DIAGNOSIS — D631 Anemia in chronic kidney disease: Secondary | ICD-10-CM | POA: Diagnosis not present

## 2016-12-03 DIAGNOSIS — N186 End stage renal disease: Secondary | ICD-10-CM | POA: Diagnosis not present

## 2016-12-03 DIAGNOSIS — D509 Iron deficiency anemia, unspecified: Secondary | ICD-10-CM | POA: Diagnosis not present

## 2016-12-03 DIAGNOSIS — N2581 Secondary hyperparathyroidism of renal origin: Secondary | ICD-10-CM | POA: Diagnosis not present

## 2016-12-04 DIAGNOSIS — E1122 Type 2 diabetes mellitus with diabetic chronic kidney disease: Secondary | ICD-10-CM | POA: Diagnosis not present

## 2016-12-04 DIAGNOSIS — Z992 Dependence on renal dialysis: Secondary | ICD-10-CM | POA: Diagnosis not present

## 2016-12-04 DIAGNOSIS — N186 End stage renal disease: Secondary | ICD-10-CM | POA: Diagnosis not present

## 2016-12-06 DIAGNOSIS — D509 Iron deficiency anemia, unspecified: Secondary | ICD-10-CM | POA: Diagnosis not present

## 2016-12-06 DIAGNOSIS — N186 End stage renal disease: Secondary | ICD-10-CM | POA: Diagnosis not present

## 2016-12-06 DIAGNOSIS — D631 Anemia in chronic kidney disease: Secondary | ICD-10-CM | POA: Diagnosis not present

## 2016-12-06 DIAGNOSIS — E8779 Other fluid overload: Secondary | ICD-10-CM | POA: Diagnosis not present

## 2016-12-06 DIAGNOSIS — N2581 Secondary hyperparathyroidism of renal origin: Secondary | ICD-10-CM | POA: Diagnosis not present

## 2016-12-08 DIAGNOSIS — D509 Iron deficiency anemia, unspecified: Secondary | ICD-10-CM | POA: Diagnosis not present

## 2016-12-08 DIAGNOSIS — N186 End stage renal disease: Secondary | ICD-10-CM | POA: Diagnosis not present

## 2016-12-08 DIAGNOSIS — E8779 Other fluid overload: Secondary | ICD-10-CM | POA: Diagnosis not present

## 2016-12-08 DIAGNOSIS — D631 Anemia in chronic kidney disease: Secondary | ICD-10-CM | POA: Diagnosis not present

## 2016-12-08 DIAGNOSIS — N2581 Secondary hyperparathyroidism of renal origin: Secondary | ICD-10-CM | POA: Diagnosis not present

## 2016-12-10 DIAGNOSIS — D631 Anemia in chronic kidney disease: Secondary | ICD-10-CM | POA: Diagnosis not present

## 2016-12-10 DIAGNOSIS — N186 End stage renal disease: Secondary | ICD-10-CM | POA: Diagnosis not present

## 2016-12-10 DIAGNOSIS — N2581 Secondary hyperparathyroidism of renal origin: Secondary | ICD-10-CM | POA: Diagnosis not present

## 2016-12-10 DIAGNOSIS — E8779 Other fluid overload: Secondary | ICD-10-CM | POA: Diagnosis not present

## 2016-12-10 DIAGNOSIS — D509 Iron deficiency anemia, unspecified: Secondary | ICD-10-CM | POA: Diagnosis not present

## 2016-12-13 DIAGNOSIS — N2581 Secondary hyperparathyroidism of renal origin: Secondary | ICD-10-CM | POA: Diagnosis not present

## 2016-12-13 DIAGNOSIS — D509 Iron deficiency anemia, unspecified: Secondary | ICD-10-CM | POA: Diagnosis not present

## 2016-12-13 DIAGNOSIS — D631 Anemia in chronic kidney disease: Secondary | ICD-10-CM | POA: Diagnosis not present

## 2016-12-13 DIAGNOSIS — E8779 Other fluid overload: Secondary | ICD-10-CM | POA: Diagnosis not present

## 2016-12-13 DIAGNOSIS — N186 End stage renal disease: Secondary | ICD-10-CM | POA: Diagnosis not present

## 2016-12-14 ENCOUNTER — Ambulatory Visit (INDEPENDENT_AMBULATORY_CARE_PROVIDER_SITE_OTHER): Payer: Medicare Other | Admitting: Endocrinology

## 2016-12-14 VITALS — BP 182/92 | Wt 201.0 lb

## 2016-12-14 DIAGNOSIS — E1022 Type 1 diabetes mellitus with diabetic chronic kidney disease: Secondary | ICD-10-CM

## 2016-12-14 DIAGNOSIS — N186 End stage renal disease: Secondary | ICD-10-CM | POA: Diagnosis not present

## 2016-12-14 DIAGNOSIS — Z992 Dependence on renal dialysis: Secondary | ICD-10-CM | POA: Diagnosis not present

## 2016-12-14 LAB — POCT GLYCOSYLATED HEMOGLOBIN (HGB A1C): Hemoglobin A1C: 9.7

## 2016-12-14 MED ORDER — INSULIN GLARGINE 100 UNIT/ML SOLOSTAR PEN: 35.0000 [IU] | PEN_INJECTOR | SUBCUTANEOUS | 99 refills | Status: DC

## 2016-12-14 NOTE — Patient Instructions (Addendum)
check your blood sugar 4 times a day: before the 3 meals, and at bedtime.  also check if you have symptoms of your blood sugar being too high or too low.  please keep a record of the readings and bring it to your next appointment here (or you can bring the meter itself).  You can write it on any piece of paper.  please call us sooner if your blood sugar goes below 70, or if you have a lot of readings over 200.   On this type of insulin schedule, you should eat meals on a regular schedule.  If a meal is missed or significantly delayed, your blood sugar could go low. Please change the NPH back to lantus, 35 units each morning.  I have sent a prescription to your pharmacy.  Please come back for a follow-up appointment in 1 month.

## 2016-12-14 NOTE — Progress Notes (Signed)
Subjective:    Patient ID: Edward Colon, male    DOB: 12-29-1980, 36 y.o.   MRN: [id number]  HPI DM type: 1 Dx'ed: 0350 Complications: retinopathy and ESRD Therapy: insulin since dx DKA: last episode was in approx 2007 Severe hypoglycemia:  last episode was in 2018 Pancreatitis: never. Other: he takes qd insulin, after poor results with multiple daily injections; he was changed from lantus to NPH, due to daily pattern of cbg's.  he works on a food truck.    Interval history: He takes 35 units qam, every day.  no cbg record, but states cbg's are often low in the afternoon.  Otherwise, there is no trend throughout the day.  He says cbg's are the same on HD days, vs non-HD days.  Last episode of severe hypoglycemia was a few days ago.  He says he never misses the insulin.   Past Medical History:  Diagnosis Date  . Anemia   . Blind left eye since ~ 2010  . Depression   . Diabetic peripheral neuropathy ([place])   . ESRD (end stage renal disease) on dialysis [facility])    "TTS; [facility]; [facility]" (02/01/2016)  . Heart murmur    denies any problems with it  . Hypertension   . Seizures ([place])    "last one was end of 2016; they are related to my diabetes" (02/01/2016)  . Type 1 diabetes ([place]) dx'd 1990    Past Surgical History:  Procedure Laterality Date  . AV FISTULA PLACEMENT Right 03/03/2015   Procedure: RIGHT RADIO-CEPHALIC ARTERIOVENOUS (AV) FISTULA CREATION;  Surgeon: Serafina Mitchell, MD;  [facility];  Service: Vascular;  Laterality: Right;  . AV FISTULA PLACEMENT Left 06/15/2015   Procedure: INSERTION OF LEFT UPPER ARM  ARTERIOVENOUS (AV) 84mm x 50cm GORE-TEX GRAFT;  Surgeon: Elam Dutch, MD;  Location: [place];  Service: Vascular;  Laterality: Left;  . BASCILIC VEIN TRANSPOSITION Left 04/01/2015   Procedure: BASCILIC VEIN TRANSPOSITION-LEFT ARM- FIRST STAGE;  Surgeon: Elam Dutch, MD;  Location: [place];  Service: Vascular;  Laterality: Left;  . EYE SURGERY Right ~  2010   for diabetic retinopathy  . INSERTION OF DIALYSIS CATHETER Right 03/03/2015   Procedure: INSERTION OF DIALYSIS CATHETER;  Surgeon: Serafina Mitchell, MD;  Location[facility] OR;  Service: Vascular;  Laterality: Right;    Social History   Social History  . Marital status: Single    Spouse name: N/A  . Number of children: N/A  . Years of education: N/A   Occupational History  . Not on file.   Social History Main Topics  . Smoking status: Never Smoker  . Smokeless tobacco: Never Used  . Alcohol use Yes     Comment: 02/01/2016 "might have 1 drink/year"  . Drug use: No  . Sexual activity: Yes   Other Topics Concern  . Not on file   Social History Narrative  . No narrative on file    Current Outpatient Prescriptions on File Prior to Visit  Medication Sig Dispense Refill  . calcium acetate (PHOSLO) 667 MG capsule Take 1 capsule (667 mg total) by mouth 3 (three) times daily with meals. (Patient taking differently: Take [phone] mg by mouth See admin instructions. Take 4 capsules (2668 mg) by mouth with meals and 2 capsules (1334 mg) with snacks) 90 capsule 0  . FLUoxetine (PROZAC) 20 MG capsule Take 1 capsule (20 mg total) by mouth daily. 20 capsule 0  . hydrOXYzine (ATARAX/VISTARIL) 25 MG  tablet Take 1 tablet (25 mg total) by mouth at bedtime as needed and may repeat dose one time if needed for anxiety (insomnia.). 15 tablet 0  . multivitamin (RENA-VIT) TABS tablet Take 1 tablet by mouth at bedtime. (Patient taking differently: Take 1 tablet by mouth daily. ) 30 tablet 0   No current facility-administered medications on file prior to visit.     No Known Allergies  Family History  Problem Relation Age of Onset  . Diabetes Neg Hx     BP (!) 182/92   Wt 201 lb (91.2 kg)   SpO2 98%   BMI 28.03 kg/m   Review of Systems He has lost weight.      Objective:   Physical Exam VITAL SIGNS:  See vs page GENERAL: no distress Pulses: foot pulses are intact bilaterally.     MSK: no deformity of the feet or ankles.  CV: no edema of the legs or ankles Skin:  no ulcer on the feet or ankles.  normal color and temp on the feet and ankles.  Neuro: sensation is intact to touch on the feet and ankles.    A1c=9.2%     Assessment & Plan:  Type 1 DM, with DR.  She needs increased rx.  The pattern of his cbg's indicates he needs to go back to lantus.   Severe hypoglycemia, recurrent: she is not a candidate for aggressive glycemic control.  Noncompliance with cbg recording, f/u ov's, and insulin dosing.  This complicates the rx of DM.    Patient Instructions  check your blood sugar 4 times a day: before the 3 meals, and at bedtime.  also check if you have symptoms of your blood sugar being too high or too low.  please keep a record of the readings and bring it to your next appointment here (or you can bring the meter itself).  You can write it on any piece of paper.  please call us sooner if your blood sugar goes below 70, or if you have a lot of readings over 200.   On this type of insulin schedule, you should eat meals on a regular schedule.  If a meal is missed or significantly delayed, your blood sugar could go low. Please change the NPH back to lantus, 35 units each morning.  I have sent a prescription to your pharmacy.  Please come back for a follow-up appointment in 1 month.

## 2016-12-15 DIAGNOSIS — N186 End stage renal disease: Secondary | ICD-10-CM | POA: Diagnosis not present

## 2016-12-15 DIAGNOSIS — D509 Iron deficiency anemia, unspecified: Secondary | ICD-10-CM | POA: Diagnosis not present

## 2016-12-15 DIAGNOSIS — E8779 Other fluid overload: Secondary | ICD-10-CM | POA: Diagnosis not present

## 2016-12-15 DIAGNOSIS — D631 Anemia in chronic kidney disease: Secondary | ICD-10-CM | POA: Diagnosis not present

## 2016-12-15 DIAGNOSIS — N2581 Secondary hyperparathyroidism of renal origin: Secondary | ICD-10-CM | POA: Diagnosis not present

## 2016-12-16 DIAGNOSIS — E1122 Type 2 diabetes mellitus with diabetic chronic kidney disease: Secondary | ICD-10-CM | POA: Diagnosis not present

## 2016-12-16 DIAGNOSIS — R55 Syncope and collapse: Secondary | ICD-10-CM | POA: Diagnosis not present

## 2016-12-16 DIAGNOSIS — Z79899 Other long term (current) drug therapy: Secondary | ICD-10-CM | POA: Diagnosis not present

## 2016-12-16 DIAGNOSIS — E162 Hypoglycemia, unspecified: Secondary | ICD-10-CM | POA: Diagnosis not present

## 2016-12-16 DIAGNOSIS — I120 Hypertensive chronic kidney disease with stage 5 chronic kidney disease or end stage renal disease: Secondary | ICD-10-CM | POA: Diagnosis not present

## 2016-12-16 DIAGNOSIS — R4182 Altered mental status, unspecified: Secondary | ICD-10-CM | POA: Diagnosis not present

## 2016-12-16 DIAGNOSIS — E161 Other hypoglycemia: Secondary | ICD-10-CM | POA: Diagnosis not present

## 2016-12-16 DIAGNOSIS — N186 End stage renal disease: Secondary | ICD-10-CM | POA: Diagnosis not present

## 2016-12-16 DIAGNOSIS — E11649 Type 2 diabetes mellitus with hypoglycemia without coma: Secondary | ICD-10-CM | POA: Diagnosis not present

## 2016-12-20 DIAGNOSIS — N186 End stage renal disease: Secondary | ICD-10-CM | POA: Diagnosis not present

## 2016-12-20 DIAGNOSIS — D631 Anemia in chronic kidney disease: Secondary | ICD-10-CM | POA: Diagnosis not present

## 2016-12-20 DIAGNOSIS — E8779 Other fluid overload: Secondary | ICD-10-CM | POA: Diagnosis not present

## 2016-12-20 DIAGNOSIS — D509 Iron deficiency anemia, unspecified: Secondary | ICD-10-CM | POA: Diagnosis not present

## 2016-12-20 DIAGNOSIS — N2581 Secondary hyperparathyroidism of renal origin: Secondary | ICD-10-CM | POA: Diagnosis not present

## 2016-12-22 DIAGNOSIS — N2581 Secondary hyperparathyroidism of renal origin: Secondary | ICD-10-CM | POA: Diagnosis not present

## 2016-12-22 DIAGNOSIS — D631 Anemia in chronic kidney disease: Secondary | ICD-10-CM | POA: Diagnosis not present

## 2016-12-22 DIAGNOSIS — N186 End stage renal disease: Secondary | ICD-10-CM | POA: Diagnosis not present

## 2016-12-22 DIAGNOSIS — D509 Iron deficiency anemia, unspecified: Secondary | ICD-10-CM | POA: Diagnosis not present

## 2016-12-22 DIAGNOSIS — E8779 Other fluid overload: Secondary | ICD-10-CM | POA: Diagnosis not present

## 2016-12-24 DIAGNOSIS — E8779 Other fluid overload: Secondary | ICD-10-CM | POA: Diagnosis not present

## 2016-12-24 DIAGNOSIS — D509 Iron deficiency anemia, unspecified: Secondary | ICD-10-CM | POA: Diagnosis not present

## 2016-12-24 DIAGNOSIS — N186 End stage renal disease: Secondary | ICD-10-CM | POA: Diagnosis not present

## 2016-12-24 DIAGNOSIS — D631 Anemia in chronic kidney disease: Secondary | ICD-10-CM | POA: Diagnosis not present

## 2016-12-24 DIAGNOSIS — N2581 Secondary hyperparathyroidism of renal origin: Secondary | ICD-10-CM | POA: Diagnosis not present

## 2016-12-27 DIAGNOSIS — N186 End stage renal disease: Secondary | ICD-10-CM | POA: Diagnosis not present

## 2016-12-27 DIAGNOSIS — N2581 Secondary hyperparathyroidism of renal origin: Secondary | ICD-10-CM | POA: Diagnosis not present

## 2016-12-27 DIAGNOSIS — D509 Iron deficiency anemia, unspecified: Secondary | ICD-10-CM | POA: Diagnosis not present

## 2016-12-27 DIAGNOSIS — E8779 Other fluid overload: Secondary | ICD-10-CM | POA: Diagnosis not present

## 2016-12-27 DIAGNOSIS — D631 Anemia in chronic kidney disease: Secondary | ICD-10-CM | POA: Diagnosis not present

## 2016-12-29 DIAGNOSIS — D509 Iron deficiency anemia, unspecified: Secondary | ICD-10-CM | POA: Diagnosis not present

## 2016-12-29 DIAGNOSIS — N2581 Secondary hyperparathyroidism of renal origin: Secondary | ICD-10-CM | POA: Diagnosis not present

## 2016-12-29 DIAGNOSIS — N186 End stage renal disease: Secondary | ICD-10-CM | POA: Diagnosis not present

## 2016-12-29 DIAGNOSIS — D631 Anemia in chronic kidney disease: Secondary | ICD-10-CM | POA: Diagnosis not present

## 2016-12-29 DIAGNOSIS — E8779 Other fluid overload: Secondary | ICD-10-CM | POA: Diagnosis not present

## 2016-12-31 DIAGNOSIS — E8779 Other fluid overload: Secondary | ICD-10-CM | POA: Diagnosis not present

## 2016-12-31 DIAGNOSIS — N186 End stage renal disease: Secondary | ICD-10-CM | POA: Diagnosis not present

## 2016-12-31 DIAGNOSIS — N2581 Secondary hyperparathyroidism of renal origin: Secondary | ICD-10-CM | POA: Diagnosis not present

## 2016-12-31 DIAGNOSIS — D631 Anemia in chronic kidney disease: Secondary | ICD-10-CM | POA: Diagnosis not present

## 2016-12-31 DIAGNOSIS — D509 Iron deficiency anemia, unspecified: Secondary | ICD-10-CM | POA: Diagnosis not present

## 2017-01-03 DIAGNOSIS — N2581 Secondary hyperparathyroidism of renal origin: Secondary | ICD-10-CM | POA: Diagnosis not present

## 2017-01-03 DIAGNOSIS — D509 Iron deficiency anemia, unspecified: Secondary | ICD-10-CM | POA: Diagnosis not present

## 2017-01-03 DIAGNOSIS — E8779 Other fluid overload: Secondary | ICD-10-CM | POA: Diagnosis not present

## 2017-01-03 DIAGNOSIS — N186 End stage renal disease: Secondary | ICD-10-CM | POA: Diagnosis not present

## 2017-01-03 DIAGNOSIS — D631 Anemia in chronic kidney disease: Secondary | ICD-10-CM | POA: Diagnosis not present

## 2017-01-04 DIAGNOSIS — N186 End stage renal disease: Secondary | ICD-10-CM | POA: Diagnosis not present

## 2017-01-04 DIAGNOSIS — E1122 Type 2 diabetes mellitus with diabetic chronic kidney disease: Secondary | ICD-10-CM | POA: Diagnosis not present

## 2017-01-04 DIAGNOSIS — Z992 Dependence on renal dialysis: Secondary | ICD-10-CM | POA: Diagnosis not present

## 2017-01-05 DIAGNOSIS — N186 End stage renal disease: Secondary | ICD-10-CM | POA: Diagnosis not present

## 2017-01-05 DIAGNOSIS — D631 Anemia in chronic kidney disease: Secondary | ICD-10-CM | POA: Diagnosis not present

## 2017-01-05 DIAGNOSIS — E8779 Other fluid overload: Secondary | ICD-10-CM | POA: Diagnosis not present

## 2017-01-05 DIAGNOSIS — N2581 Secondary hyperparathyroidism of renal origin: Secondary | ICD-10-CM | POA: Diagnosis not present

## 2017-01-10 DIAGNOSIS — E8779 Other fluid overload: Secondary | ICD-10-CM | POA: Diagnosis not present

## 2017-01-10 DIAGNOSIS — D631 Anemia in chronic kidney disease: Secondary | ICD-10-CM | POA: Diagnosis not present

## 2017-01-10 DIAGNOSIS — N2581 Secondary hyperparathyroidism of renal origin: Secondary | ICD-10-CM | POA: Diagnosis not present

## 2017-01-10 DIAGNOSIS — N186 End stage renal disease: Secondary | ICD-10-CM | POA: Diagnosis not present

## 2017-01-12 DIAGNOSIS — E8779 Other fluid overload: Secondary | ICD-10-CM | POA: Diagnosis not present

## 2017-01-12 DIAGNOSIS — N2581 Secondary hyperparathyroidism of renal origin: Secondary | ICD-10-CM | POA: Diagnosis not present

## 2017-01-12 DIAGNOSIS — D631 Anemia in chronic kidney disease: Secondary | ICD-10-CM | POA: Diagnosis not present

## 2017-01-12 DIAGNOSIS — N186 End stage renal disease: Secondary | ICD-10-CM | POA: Diagnosis not present

## 2017-01-12 DIAGNOSIS — E1129 Type 2 diabetes mellitus with other diabetic kidney complication: Secondary | ICD-10-CM | POA: Diagnosis not present

## 2017-01-13 ENCOUNTER — Ambulatory Visit: Payer: Self-pay | Admitting: Endocrinology

## 2017-01-14 DIAGNOSIS — D631 Anemia in chronic kidney disease: Secondary | ICD-10-CM | POA: Diagnosis not present

## 2017-01-14 DIAGNOSIS — N186 End stage renal disease: Secondary | ICD-10-CM | POA: Diagnosis not present

## 2017-01-14 DIAGNOSIS — E8779 Other fluid overload: Secondary | ICD-10-CM | POA: Diagnosis not present

## 2017-01-14 DIAGNOSIS — N2581 Secondary hyperparathyroidism of renal origin: Secondary | ICD-10-CM | POA: Diagnosis not present

## 2017-01-15 ENCOUNTER — Emergency Department (HOSPITAL_COMMUNITY): Payer: Medicare Other

## 2017-01-15 ENCOUNTER — Other Ambulatory Visit: Payer: Self-pay

## 2017-01-15 ENCOUNTER — Encounter (HOSPITAL_COMMUNITY): Payer: Self-pay

## 2017-01-15 ENCOUNTER — Emergency Department (HOSPITAL_COMMUNITY)
Admission: EM | Admit: 2017-01-15 | Discharge: 2017-01-15 | Disposition: A | Discharge status: Home / Self Care | Payer: Medicare Other | Attending: Emergency Medicine | Admitting: Emergency Medicine

## 2017-01-15 DIAGNOSIS — Z79899 Other long term (current) drug therapy: Secondary | ICD-10-CM | POA: Diagnosis not present

## 2017-01-15 DIAGNOSIS — N186 End stage renal disease: Secondary | ICD-10-CM | POA: Insufficient documentation

## 2017-01-15 DIAGNOSIS — R6889 Other general symptoms and signs: Secondary | ICD-10-CM | POA: Diagnosis not present

## 2017-01-15 DIAGNOSIS — M542 Cervicalgia: Secondary | ICD-10-CM | POA: Diagnosis not present

## 2017-01-15 DIAGNOSIS — Z992 Dependence on renal dialysis: Secondary | ICD-10-CM | POA: Insufficient documentation

## 2017-01-15 DIAGNOSIS — E10649 Type 1 diabetes mellitus with hypoglycemia without coma: Secondary | ICD-10-CM | POA: Insufficient documentation

## 2017-01-15 DIAGNOSIS — M79632 Pain in left forearm: Secondary | ICD-10-CM | POA: Insufficient documentation

## 2017-01-15 DIAGNOSIS — I120 Hypertensive chronic kidney disease with stage 5 chronic kidney disease or end stage renal disease: Secondary | ICD-10-CM | POA: Diagnosis not present

## 2017-01-15 DIAGNOSIS — E1022 Type 1 diabetes mellitus with diabetic chronic kidney disease: Secondary | ICD-10-CM | POA: Insufficient documentation

## 2017-01-15 DIAGNOSIS — S199XXA Unspecified injury of neck, initial encounter: Secondary | ICD-10-CM | POA: Diagnosis not present

## 2017-01-15 DIAGNOSIS — E161 Other hypoglycemia: Secondary | ICD-10-CM | POA: Diagnosis not present

## 2017-01-15 DIAGNOSIS — R531 Weakness: Secondary | ICD-10-CM | POA: Diagnosis not present

## 2017-01-15 DIAGNOSIS — S59912A Unspecified injury of left forearm, initial encounter: Secondary | ICD-10-CM | POA: Diagnosis not present

## 2017-01-15 LAB — CBG MONITORING, ED
GLUCOSE-CAPILLARY: 121 mg/dL — AB (ref 65–99)
GLUCOSE-CAPILLARY: 165 mg/dL — AB (ref 65–99)

## 2017-01-15 NOTE — ED Notes (Signed)
Pt given some food
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
## 2017-01-15 NOTE — ED Notes (Signed)
Pt CBG 179 Ruth notified.
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
## 2017-01-16 LAB — CBG MONITORING, ED
GLUCOSE-CAPILLARY: 179 mg/dL — AB (ref 65–99)
GLUCOSE-CAPILLARY: 89 mg/dL (ref 65–99)

## 2017-01-17 DIAGNOSIS — N2581 Secondary hyperparathyroidism of renal origin: Secondary | ICD-10-CM | POA: Diagnosis not present

## 2017-01-17 DIAGNOSIS — E8779 Other fluid overload: Secondary | ICD-10-CM | POA: Diagnosis not present

## 2017-01-17 DIAGNOSIS — D631 Anemia in chronic kidney disease: Secondary | ICD-10-CM | POA: Diagnosis not present

## 2017-01-17 DIAGNOSIS — N186 End stage renal disease: Secondary | ICD-10-CM | POA: Diagnosis not present

## 2017-01-19 DIAGNOSIS — N186 End stage renal disease: Secondary | ICD-10-CM | POA: Diagnosis not present

## 2017-01-19 DIAGNOSIS — E8779 Other fluid overload: Secondary | ICD-10-CM | POA: Diagnosis not present

## 2017-01-19 DIAGNOSIS — N2581 Secondary hyperparathyroidism of renal origin: Secondary | ICD-10-CM | POA: Diagnosis not present

## 2017-01-19 DIAGNOSIS — D631 Anemia in chronic kidney disease: Secondary | ICD-10-CM | POA: Diagnosis not present

## 2017-01-21 DIAGNOSIS — D631 Anemia in chronic kidney disease: Secondary | ICD-10-CM | POA: Diagnosis not present

## 2017-01-21 DIAGNOSIS — E8779 Other fluid overload: Secondary | ICD-10-CM | POA: Diagnosis not present

## 2017-01-21 DIAGNOSIS — N186 End stage renal disease: Secondary | ICD-10-CM | POA: Diagnosis not present

## 2017-01-21 DIAGNOSIS — N2581 Secondary hyperparathyroidism of renal origin: Secondary | ICD-10-CM | POA: Diagnosis not present

## 2017-01-24 DIAGNOSIS — N2581 Secondary hyperparathyroidism of renal origin: Secondary | ICD-10-CM | POA: Diagnosis not present

## 2017-01-24 DIAGNOSIS — D631 Anemia in chronic kidney disease: Secondary | ICD-10-CM | POA: Diagnosis not present

## 2017-01-24 DIAGNOSIS — N186 End stage renal disease: Secondary | ICD-10-CM | POA: Diagnosis not present

## 2017-01-24 DIAGNOSIS — E8779 Other fluid overload: Secondary | ICD-10-CM | POA: Diagnosis not present

## 2017-01-28 DIAGNOSIS — N186 End stage renal disease: Secondary | ICD-10-CM | POA: Diagnosis not present

## 2017-01-28 DIAGNOSIS — D631 Anemia in chronic kidney disease: Secondary | ICD-10-CM | POA: Diagnosis not present

## 2017-01-28 DIAGNOSIS — E8779 Other fluid overload: Secondary | ICD-10-CM | POA: Diagnosis not present

## 2017-01-28 DIAGNOSIS — N2581 Secondary hyperparathyroidism of renal origin: Secondary | ICD-10-CM | POA: Diagnosis not present

## 2017-01-31 DIAGNOSIS — N2581 Secondary hyperparathyroidism of renal origin: Secondary | ICD-10-CM | POA: Diagnosis not present

## 2017-01-31 DIAGNOSIS — N186 End stage renal disease: Secondary | ICD-10-CM | POA: Diagnosis not present

## 2017-01-31 DIAGNOSIS — E8779 Other fluid overload: Secondary | ICD-10-CM | POA: Diagnosis not present

## 2017-01-31 DIAGNOSIS — D631 Anemia in chronic kidney disease: Secondary | ICD-10-CM | POA: Diagnosis not present

## 2017-02-02 DIAGNOSIS — N186 End stage renal disease: Secondary | ICD-10-CM | POA: Diagnosis not present

## 2017-02-02 DIAGNOSIS — E8779 Other fluid overload: Secondary | ICD-10-CM | POA: Diagnosis not present

## 2017-02-02 DIAGNOSIS — N2581 Secondary hyperparathyroidism of renal origin: Secondary | ICD-10-CM | POA: Diagnosis not present

## 2017-02-02 DIAGNOSIS — D631 Anemia in chronic kidney disease: Secondary | ICD-10-CM | POA: Diagnosis not present

## 2017-02-03 DIAGNOSIS — Z992 Dependence on renal dialysis: Secondary | ICD-10-CM | POA: Diagnosis not present

## 2017-02-03 DIAGNOSIS — E1122 Type 2 diabetes mellitus with diabetic chronic kidney disease: Secondary | ICD-10-CM | POA: Diagnosis not present

## 2017-02-03 DIAGNOSIS — N186 End stage renal disease: Secondary | ICD-10-CM | POA: Diagnosis not present

## 2017-02-07 DIAGNOSIS — D631 Anemia in chronic kidney disease: Secondary | ICD-10-CM | POA: Diagnosis not present

## 2017-02-07 DIAGNOSIS — N2581 Secondary hyperparathyroidism of renal origin: Secondary | ICD-10-CM | POA: Diagnosis not present

## 2017-02-07 DIAGNOSIS — N186 End stage renal disease: Secondary | ICD-10-CM | POA: Diagnosis not present

## 2017-02-07 DIAGNOSIS — E8779 Other fluid overload: Secondary | ICD-10-CM | POA: Diagnosis not present

## 2017-02-09 DIAGNOSIS — N2581 Secondary hyperparathyroidism of renal origin: Secondary | ICD-10-CM | POA: Diagnosis not present

## 2017-02-09 DIAGNOSIS — N186 End stage renal disease: Secondary | ICD-10-CM | POA: Diagnosis not present

## 2017-02-09 DIAGNOSIS — E8779 Other fluid overload: Secondary | ICD-10-CM | POA: Diagnosis not present

## 2017-02-09 DIAGNOSIS — D631 Anemia in chronic kidney disease: Secondary | ICD-10-CM | POA: Diagnosis not present

## 2017-02-14 DIAGNOSIS — N186 End stage renal disease: Secondary | ICD-10-CM | POA: Diagnosis not present

## 2017-02-14 DIAGNOSIS — E8779 Other fluid overload: Secondary | ICD-10-CM | POA: Diagnosis not present

## 2017-02-14 DIAGNOSIS — D631 Anemia in chronic kidney disease: Secondary | ICD-10-CM | POA: Diagnosis not present

## 2017-02-14 DIAGNOSIS — N2581 Secondary hyperparathyroidism of renal origin: Secondary | ICD-10-CM | POA: Diagnosis not present

## 2017-02-16 DIAGNOSIS — N186 End stage renal disease: Secondary | ICD-10-CM | POA: Diagnosis not present

## 2017-02-16 DIAGNOSIS — N2581 Secondary hyperparathyroidism of renal origin: Secondary | ICD-10-CM | POA: Diagnosis not present

## 2017-02-16 DIAGNOSIS — E8779 Other fluid overload: Secondary | ICD-10-CM | POA: Diagnosis not present

## 2017-02-16 DIAGNOSIS — D631 Anemia in chronic kidney disease: Secondary | ICD-10-CM | POA: Diagnosis not present

## 2017-02-21 DIAGNOSIS — N2581 Secondary hyperparathyroidism of renal origin: Secondary | ICD-10-CM | POA: Diagnosis not present

## 2017-02-21 DIAGNOSIS — E8779 Other fluid overload: Secondary | ICD-10-CM | POA: Diagnosis not present

## 2017-02-21 DIAGNOSIS — N186 End stage renal disease: Secondary | ICD-10-CM | POA: Diagnosis not present

## 2017-02-21 DIAGNOSIS — D631 Anemia in chronic kidney disease: Secondary | ICD-10-CM | POA: Diagnosis not present

## 2017-02-23 DIAGNOSIS — N2581 Secondary hyperparathyroidism of renal origin: Secondary | ICD-10-CM | POA: Diagnosis not present

## 2017-02-23 DIAGNOSIS — D631 Anemia in chronic kidney disease: Secondary | ICD-10-CM | POA: Diagnosis not present

## 2017-02-23 DIAGNOSIS — E8779 Other fluid overload: Secondary | ICD-10-CM | POA: Diagnosis not present

## 2017-02-23 DIAGNOSIS — N186 End stage renal disease: Secondary | ICD-10-CM | POA: Diagnosis not present

## 2017-02-25 DIAGNOSIS — N186 End stage renal disease: Secondary | ICD-10-CM | POA: Diagnosis not present

## 2017-02-25 DIAGNOSIS — E8779 Other fluid overload: Secondary | ICD-10-CM | POA: Diagnosis not present

## 2017-02-25 DIAGNOSIS — D631 Anemia in chronic kidney disease: Secondary | ICD-10-CM | POA: Diagnosis not present

## 2017-02-25 DIAGNOSIS — N2581 Secondary hyperparathyroidism of renal origin: Secondary | ICD-10-CM | POA: Diagnosis not present

## 2017-02-27 DIAGNOSIS — E8779 Other fluid overload: Secondary | ICD-10-CM | POA: Diagnosis not present

## 2017-02-27 DIAGNOSIS — N2581 Secondary hyperparathyroidism of renal origin: Secondary | ICD-10-CM | POA: Diagnosis not present

## 2017-02-27 DIAGNOSIS — N186 End stage renal disease: Secondary | ICD-10-CM | POA: Diagnosis not present

## 2017-02-27 DIAGNOSIS — D631 Anemia in chronic kidney disease: Secondary | ICD-10-CM | POA: Diagnosis not present

## 2017-03-02 DIAGNOSIS — E8779 Other fluid overload: Secondary | ICD-10-CM | POA: Diagnosis not present

## 2017-03-02 DIAGNOSIS — N2581 Secondary hyperparathyroidism of renal origin: Secondary | ICD-10-CM | POA: Diagnosis not present

## 2017-03-02 DIAGNOSIS — D631 Anemia in chronic kidney disease: Secondary | ICD-10-CM | POA: Diagnosis not present

## 2017-03-02 DIAGNOSIS — N186 End stage renal disease: Secondary | ICD-10-CM | POA: Diagnosis not present

## 2017-03-05 ENCOUNTER — Encounter (HOSPITAL_COMMUNITY): Payer: Self-pay

## 2017-03-05 ENCOUNTER — Inpatient Hospital Stay (HOSPITAL_COMMUNITY): Payer: Medicare Other

## 2017-03-05 ENCOUNTER — Other Ambulatory Visit: Payer: Self-pay

## 2017-03-05 ENCOUNTER — Inpatient Hospital Stay (HOSPITAL_COMMUNITY)
Admission: EM | Admit: 2017-03-05 | Discharge: 2017-03-08 | DRG: 637 | Disposition: A | Discharge status: Home / Self Care | Payer: Medicare Other | Attending: Internal Medicine | Admitting: Internal Medicine

## 2017-03-05 ENCOUNTER — Emergency Department (HOSPITAL_COMMUNITY): Payer: Medicare Other

## 2017-03-05 DIAGNOSIS — N170 Acute kidney failure with tubular necrosis: Secondary | ICD-10-CM

## 2017-03-05 DIAGNOSIS — E1022 Type 1 diabetes mellitus with diabetic chronic kidney disease: Secondary | ICD-10-CM | POA: Diagnosis present

## 2017-03-05 DIAGNOSIS — E871 Hypo-osmolality and hyponatremia: Secondary | ICD-10-CM | POA: Diagnosis not present

## 2017-03-05 DIAGNOSIS — F329 Major depressive disorder, single episode, unspecified: Secondary | ICD-10-CM | POA: Diagnosis present

## 2017-03-05 DIAGNOSIS — N186 End stage renal disease: Secondary | ICD-10-CM | POA: Diagnosis present

## 2017-03-05 DIAGNOSIS — D631 Anemia in chronic kidney disease: Secondary | ICD-10-CM | POA: Diagnosis present

## 2017-03-05 DIAGNOSIS — R569 Unspecified convulsions: Secondary | ICD-10-CM

## 2017-03-05 DIAGNOSIS — E875 Hyperkalemia: Secondary | ICD-10-CM | POA: Diagnosis not present

## 2017-03-05 DIAGNOSIS — E1065 Type 1 diabetes mellitus with hyperglycemia: Secondary | ICD-10-CM | POA: Diagnosis not present

## 2017-03-05 DIAGNOSIS — I468 Cardiac arrest due to other underlying condition: Secondary | ICD-10-CM | POA: Diagnosis present

## 2017-03-05 DIAGNOSIS — E0910 Drug or chemical induced diabetes mellitus with ketoacidosis without coma: Secondary | ICD-10-CM | POA: Diagnosis not present

## 2017-03-05 DIAGNOSIS — G40909 Epilepsy, unspecified, not intractable, without status epilepticus: Secondary | ICD-10-CM | POA: Diagnosis present

## 2017-03-05 DIAGNOSIS — J9600 Acute respiratory failure, unspecified whether with hypoxia or hypercapnia: Secondary | ICD-10-CM | POA: Insufficient documentation

## 2017-03-05 DIAGNOSIS — I472 Ventricular tachycardia, unspecified: Secondary | ICD-10-CM

## 2017-03-05 DIAGNOSIS — E10649 Type 1 diabetes mellitus with hypoglycemia without coma: Secondary | ICD-10-CM | POA: Diagnosis present

## 2017-03-05 DIAGNOSIS — Z992 Dependence on renal dialysis: Secondary | ICD-10-CM

## 2017-03-05 DIAGNOSIS — E0810 Diabetes mellitus due to underlying condition with ketoacidosis without coma: Secondary | ICD-10-CM

## 2017-03-05 DIAGNOSIS — E1110 Type 2 diabetes mellitus with ketoacidosis without coma: Secondary | ICD-10-CM | POA: Diagnosis present

## 2017-03-05 DIAGNOSIS — H5462 Unqualified visual loss, left eye, normal vision right eye: Secondary | ICD-10-CM | POA: Diagnosis present

## 2017-03-05 DIAGNOSIS — E1122 Type 2 diabetes mellitus with diabetic chronic kidney disease: Secondary | ICD-10-CM | POA: Diagnosis not present

## 2017-03-05 DIAGNOSIS — N179 Acute kidney failure, unspecified: Secondary | ICD-10-CM | POA: Diagnosis present

## 2017-03-05 DIAGNOSIS — I959 Hypotension, unspecified: Secondary | ICD-10-CM | POA: Diagnosis not present

## 2017-03-05 DIAGNOSIS — M7989 Other specified soft tissue disorders: Secondary | ICD-10-CM

## 2017-03-05 DIAGNOSIS — Z452 Encounter for adjustment and management of vascular access device: Secondary | ICD-10-CM | POA: Diagnosis not present

## 2017-03-05 DIAGNOSIS — Z79899 Other long term (current) drug therapy: Secondary | ICD-10-CM | POA: Diagnosis not present

## 2017-03-05 DIAGNOSIS — I420 Dilated cardiomyopathy: Secondary | ICD-10-CM | POA: Diagnosis present

## 2017-03-05 DIAGNOSIS — I120 Hypertensive chronic kidney disease with stage 5 chronic kidney disease or end stage renal disease: Secondary | ICD-10-CM | POA: Diagnosis present

## 2017-03-05 DIAGNOSIS — E1042 Type 1 diabetes mellitus with diabetic polyneuropathy: Secondary | ICD-10-CM | POA: Diagnosis present

## 2017-03-05 DIAGNOSIS — N181 Chronic kidney disease, stage 1: Secondary | ICD-10-CM

## 2017-03-05 DIAGNOSIS — I469 Cardiac arrest, cause unspecified: Secondary | ICD-10-CM

## 2017-03-05 DIAGNOSIS — I509 Heart failure, unspecified: Secondary | ICD-10-CM | POA: Diagnosis not present

## 2017-03-05 DIAGNOSIS — J9601 Acute respiratory failure with hypoxia: Secondary | ICD-10-CM | POA: Diagnosis not present

## 2017-03-05 DIAGNOSIS — I361 Nonrheumatic tricuspid (valve) insufficiency: Secondary | ICD-10-CM | POA: Diagnosis not present

## 2017-03-05 DIAGNOSIS — E876 Hypokalemia: Secondary | ICD-10-CM | POA: Diagnosis not present

## 2017-03-05 DIAGNOSIS — N189 Chronic kidney disease, unspecified: Secondary | ICD-10-CM

## 2017-03-05 DIAGNOSIS — E1010 Type 1 diabetes mellitus with ketoacidosis without coma: Secondary | ICD-10-CM | POA: Diagnosis not present

## 2017-03-05 LAB — BASIC METABOLIC PANEL
Anion gap: 26 — ABNORMAL HIGH (ref 5–15)
Anion gap: 16 — ABNORMAL HIGH (ref 5–15)
Anion gap: 15 (ref 5–15)
BUN: 83 mg/dL — AB (ref 6–20)
BUN: 82 mg/dL — AB (ref 6–20)
BUN: 82 mg/dL — AB (ref 6–20)
CALCIUM: 9.4 mg/dL (ref 8.9–10.3)
CALCIUM: 9.4 mg/dL (ref 8.9–10.3)
CALCIUM: 9.5 mg/dL (ref 8.9–10.3)
CO2: 16 mmol/L — ABNORMAL LOW (ref 22–32)
CO2: 21 mmol/L — AB (ref 22–32)
CO2: 25 mmol/L (ref 22–32)
CREATININE: 15.13 mg/dL — AB (ref 0.61–1.24)
Chloride: 88 mmol/L — ABNORMAL LOW (ref 101–111)
Chloride: 94 mmol/L — ABNORMAL LOW (ref 101–111)
Chloride: 95 mmol/L — ABNORMAL LOW (ref 101–111)
Creatinine, Ser: 15 mg/dL — ABNORMAL HIGH (ref 0.61–1.24)
Creatinine, Ser: 15.05 mg/dL — ABNORMAL HIGH (ref 0.61–1.24)
GFR calc Af Amer: 4 mL/min — ABNORMAL LOW (ref >60)
GFR calc Af Amer: 4 mL/min — ABNORMAL LOW (ref >60)
GFR calc Af Amer: 4 mL/min — ABNORMAL LOW (ref >60)
GFR, EST NON AFRICAN AMERICAN: 4 mL/min — AB (ref >60)
GFR, EST NON AFRICAN AMERICAN: 4 mL/min — AB (ref >60)
GFR, EST NON AFRICAN AMERICAN: 4 mL/min — AB (ref >60)
GLUCOSE: 824 mg/dL — AB (ref 65–99)
GLUCOSE: 687 mg/dL — AB (ref 65–99)
GLUCOSE: 353 mg/dL — AB (ref 65–99)
Potassium: 5.7 mmol/L — ABNORMAL HIGH (ref 3.5–5.1)
Potassium: 5.1 mmol/L (ref 3.5–5.1)
Potassium: 4.9 mmol/L (ref 3.5–5.1)
Sodium: 130 mmol/L — ABNORMAL LOW (ref 135–145)
Sodium: 131 mmol/L — ABNORMAL LOW (ref 135–145)
Sodium: 135 mmol/L (ref 135–145)

## 2017-03-05 LAB — COMPREHENSIVE METABOLIC PANEL
ALBUMIN: 3.8 g/dL (ref 3.5–5.0)
ALT: 207 U/L — ABNORMAL HIGH (ref 17–63)
ANION GAP: 35 — AB (ref 5–15)
AST: 229 U/L — ABNORMAL HIGH (ref 15–41)
Alkaline Phosphatase: 102 U/L (ref 38–126)
BILIRUBIN TOTAL: 1.7 mg/dL — AB (ref 0.3–1.2)
BUN: 78 mg/dL — ABNORMAL HIGH (ref 6–20)
CHLORIDE: 83 mmol/L — AB (ref 101–111)
CO2: 11 mmol/L — AB (ref 22–32)
Calcium: 9.8 mg/dL (ref 8.9–10.3)
Creatinine, Ser: 15.48 mg/dL — ABNORMAL HIGH (ref 0.61–1.24)
GFR calc Af Amer: 4 mL/min — ABNORMAL LOW (ref >60)
GFR calc non Af Amer: 4 mL/min — ABNORMAL LOW (ref >60)
GLUCOSE: 893 mg/dL — AB (ref 65–99)
POTASSIUM: 6.7 mmol/L — AB (ref 3.5–5.1)
SODIUM: 129 mmol/L — AB (ref 135–145)
TOTAL PROTEIN: 7.5 g/dL (ref 6.5–8.1)

## 2017-03-05 LAB — GLUCOSE, CAPILLARY
GLUCOSE-CAPILLARY: 396 mg/dL — AB (ref 65–99)
GLUCOSE-CAPILLARY: 211 mg/dL — AB (ref 65–99)
Glucose-Capillary: 151 mg/dL — ABNORMAL HIGH (ref 65–99)
Glucose-Capillary: 340 mg/dL — ABNORMAL HIGH (ref 65–99)
Glucose-Capillary: 503 mg/dL (ref 65–99)
Glucose-Capillary: 592 mg/dL (ref 65–99)
Glucose-Capillary: >600 mg/dL (ref 65–99)
Glucose-Capillary: >600 mg/dL (ref 65–99)
Glucose-Capillary: >600 mg/dL (ref 65–99)

## 2017-03-05 LAB — URINALYSIS, ROUTINE W REFLEX MICROSCOPIC
BILIRUBIN URINE: NEGATIVE
KETONES UR: 20 mg/dL — AB
LEUKOCYTES UA: NEGATIVE
NITRITE: NEGATIVE
Specific Gravity, Urine: 1.014 (ref 1.005–1.030)
pH: 5 (ref 5.0–8.0)

## 2017-03-05 LAB — CBC WITH DIFFERENTIAL/PLATELET
BASOS PCT: 0 %
Basophils Absolute: 0 10*3/uL (ref 0.0–0.1)
EOS ABS: 0 10*3/uL (ref 0.0–0.7)
EOS PCT: 0 %
HCT: 35.5 % — ABNORMAL LOW (ref 39.0–52.0)
Hemoglobin: 10.9 g/dL — ABNORMAL LOW (ref 13.0–17.0)
Lymphocytes Relative: 21 %
Lymphs Abs: 1.6 10*3/uL (ref 0.7–4.0)
MCH: 28.7 pg (ref 26.0–34.0)
MCHC: 30.7 g/dL (ref 30.0–36.0)
MCV: 93.4 fL (ref 78.0–100.0)
MONO ABS: 0.3 10*3/uL (ref 0.1–1.0)
MONOS PCT: 3 %
Neutro Abs: 5.7 10*3/uL (ref 1.7–7.7)
Neutrophils Relative %: 76 %
PLATELETS: 307 10*3/uL (ref 150–400)
RBC: 3.8 MIL/uL — ABNORMAL LOW (ref 4.22–5.81)
RDW: 15.5 % (ref 11.5–15.5)
WBC: 7.6 10*3/uL (ref 4.0–10.5)

## 2017-03-05 LAB — I-STAT CHEM 8, ED
BUN: 70 mg/dL — ABNORMAL HIGH (ref 6–20)
CREATININE: 15.8 mg/dL — AB (ref 0.61–1.24)
Calcium, Ion: 1.11 mmol/L — ABNORMAL LOW (ref 1.15–1.40)
Chloride: 93 mmol/L — ABNORMAL LOW (ref 101–111)
Glucose, Bld: >700 mg/dL (ref 65–99)
HCT: 36 % — ABNORMAL LOW (ref 39.0–52.0)
HEMOGLOBIN: 12.2 g/dL — AB (ref 13.0–17.0)
Potassium: 6.6 mmol/L (ref 3.5–5.1)
Sodium: 127 mmol/L — ABNORMAL LOW (ref 135–145)
TCO2: 14 mmol/L — AB (ref 22–32)

## 2017-03-05 LAB — RENAL FUNCTION PANEL
Albumin: 3.4 g/dL — ABNORMAL LOW (ref 3.5–5.0)
Anion gap: 32 — ABNORMAL HIGH (ref 5–15)
BUN: 84 mg/dL — ABNORMAL HIGH (ref 6–20)
CO2: 11 mmol/L — ABNORMAL LOW (ref 22–32)
Calcium: 9.7 mg/dL (ref 8.9–10.3)
Chloride: 87 mmol/L — ABNORMAL LOW (ref 101–111)
Creatinine, Ser: 15.18 mg/dL — ABNORMAL HIGH (ref 0.61–1.24)
GFR calc Af Amer: 4 mL/min — ABNORMAL LOW (ref >60)
GFR calc non Af Amer: 4 mL/min — ABNORMAL LOW (ref >60)
Glucose, Bld: 918 mg/dL (ref 65–99)
Phosphorus: 9 mg/dL — ABNORMAL HIGH (ref 2.5–4.6)
Potassium: 6.9 mmol/L (ref 3.5–5.1)
Sodium: 130 mmol/L — ABNORMAL LOW (ref 135–145)

## 2017-03-05 LAB — CBG MONITORING, ED
Glucose-Capillary: >600 mg/dL (ref 65–99)
Glucose-Capillary: >600 mg/dL (ref 65–99)

## 2017-03-05 LAB — I-STAT ARTERIAL BLOOD GAS, ED
ACID-BASE DEFICIT: 15 mmol/L — AB (ref 0.0–2.0)
Bicarbonate: 14.4 mmol/L — ABNORMAL LOW (ref 20.0–28.0)
O2 SAT: 64 %
PH ART: 7.078 — AB (ref 7.350–7.450)
TCO2: 16 mmol/L — AB (ref 22–32)
pCO2 arterial: 48.9 mmHg — ABNORMAL HIGH (ref 32.0–48.0)
pO2, Arterial: 46 mmHg — ABNORMAL LOW (ref 83.0–108.0)

## 2017-03-05 LAB — CBC
HCT: 34.3 % — ABNORMAL LOW (ref 39.0–52.0)
Hemoglobin: 10.3 g/dL — ABNORMAL LOW (ref 13.0–17.0)
MCH: 28.5 pg (ref 26.0–34.0)
MCHC: 30 g/dL (ref 30.0–36.0)
MCV: 94.8 fL (ref 78.0–100.0)
PLATELETS: 287 10*3/uL (ref 150–400)
RBC: 3.62 MIL/uL — ABNORMAL LOW (ref 4.22–5.81)
RDW: 15.3 % (ref 11.5–15.5)
WBC: 16.1 10*3/uL — AB (ref 4.0–10.5)

## 2017-03-05 LAB — I-STAT CG4 LACTIC ACID, ED
Lactic Acid, Venous: 11.43 mmol/L (ref 0.5–1.9)
Lactic Acid, Venous: 13.09 mmol/L (ref 0.5–1.9)

## 2017-03-05 LAB — PROTIME-INR
INR: 1.15
Prothrombin Time: 14.6 seconds (ref 11.4–15.2)

## 2017-03-05 LAB — I-STAT TROPONIN, ED: TROPONIN I, POC: 0.03 ng/mL (ref 0.00–0.08)

## 2017-03-05 LAB — INFLUENZA PANEL BY PCR (TYPE A & B)
Influenza A By PCR: NEGATIVE
Influenza B By PCR: NEGATIVE

## 2017-03-05 LAB — MAGNESIUM: MAGNESIUM: 3.8 mg/dL — AB (ref 1.7–2.4)

## 2017-03-05 LAB — MRSA PCR SCREENING: MRSA BY PCR: NEGATIVE

## 2017-03-05 LAB — PHOSPHORUS: PHOSPHORUS: 9.2 mg/dL — AB (ref 2.5–4.6)

## 2017-03-05 MED ORDER — NALOXONE HCL 2 MG/2ML IJ SOSY
1.0000 mg | PREFILLED_SYRINGE | INTRAMUSCULAR | Status: DC | PRN
  Administered 2017-03-05: 1 mg via INTRAVENOUS
  Filled 2017-03-05: qty 2

## 2017-03-05 MED ORDER — PIPERACILLIN-TAZOBACTAM 3.375 G IVPB 30 MIN
3.3750 g | Freq: Once | INTRAVENOUS | Status: AC
  Administered 2017-03-05: 3.375 g via INTRAVENOUS
  Filled 2017-03-05: qty 50

## 2017-03-05 MED ORDER — HEPARIN SODIUM (PORCINE) 5000 UNIT/ML IJ SOLN
5000.0000 [IU] | Freq: Three times a day (TID) | INTRAMUSCULAR | Status: DC
  Administered 2017-03-05 – 2017-03-06 (×3): 5000 [IU] via SUBCUTANEOUS
  Filled 2017-03-05 (×4): qty 1

## 2017-03-05 MED ORDER — DEXTROSE 5 % IV SOLN
INTRAVENOUS | Status: AC | PRN
  Administered 2017-03-05: 150 mg via INTRAVENOUS

## 2017-03-05 MED ORDER — SODIUM CHLORIDE 0.9 % IV SOLN: INTRAVENOUS | Status: DC

## 2017-03-05 MED ORDER — ALBUTEROL SULFATE (2.5 MG/3ML) 0.083% IN NEBU
2.5000 mg | INHALATION_SOLUTION | Freq: Once | RESPIRATORY_TRACT | Status: AC
  Administered 2017-03-05: 2.5 mg via RESPIRATORY_TRACT
  Filled 2017-03-05: qty 3

## 2017-03-05 MED ORDER — SODIUM CHLORIDE 0.9 % IV BOLUS (SEPSIS)
1000.0000 mL | Freq: Once | INTRAVENOUS | Status: AC
  Administered 2017-03-05: 1000 mL via INTRAVENOUS

## 2017-03-05 MED ORDER — DEXTROSE-NACL 5-0.45 % IV SOLN: INTRAVENOUS | Status: DC

## 2017-03-05 MED ORDER — SODIUM CHLORIDE 0.9 % IV SOLN
1.0000 g | Freq: Once | INTRAVENOUS | Status: DC
  Filled 2017-03-05: qty 10

## 2017-03-05 MED ORDER — NALOXONE HCL 0.4 MG/ML IJ SOLN
INTRAMUSCULAR | Status: AC
  Filled 2017-03-05: qty 1

## 2017-03-05 MED ORDER — VANCOMYCIN HCL IN DEXTROSE 1-5 GM/200ML-% IV SOLN: 1000.0000 mg | Freq: Once | INTRAVENOUS | Status: DC

## 2017-03-05 MED ORDER — SODIUM CHLORIDE 0.9 % IV SOLN
INTRAVENOUS | Status: DC
  Administered 2017-03-05: 11:00:00 via INTRAVENOUS

## 2017-03-05 MED ORDER — AMLODIPINE BESYLATE 10 MG PO TABS
10.0000 mg | ORAL_TABLET | Freq: Every day | ORAL | Status: DC
  Administered 2017-03-05 – 2017-03-08 (×4): 10 mg via ORAL
  Filled 2017-03-05 (×4): qty 1

## 2017-03-05 MED ORDER — PIPERACILLIN-TAZOBACTAM 3.375 G IVPB
3.3750 g | Freq: Two times a day (BID) | INTRAVENOUS | Status: DC
  Filled 2017-03-05: qty 50

## 2017-03-05 MED ORDER — ALBUTEROL (5 MG/ML) CONTINUOUS INHALATION SOLN
10.0000 mg/h | INHALATION_SOLUTION | RESPIRATORY_TRACT | Status: DC
  Administered 2017-03-05: 10 mg/h via RESPIRATORY_TRACT

## 2017-03-05 MED ORDER — FENTANYL CITRATE (PF) 100 MCG/2ML IJ SOLN
INTRAMUSCULAR | Status: AC
  Filled 2017-03-05: qty 2

## 2017-03-05 MED ORDER — SODIUM BICARBONATE 8.4 % IV SOLN
INTRAVENOUS | Status: AC | PRN
  Administered 2017-03-05 (×2): 50 meq via INTRAVENOUS

## 2017-03-05 MED ORDER — NALOXONE HCL 0.4 MG/ML IJ SOLN
0.4000 mg | Freq: Once | INTRAMUSCULAR | Status: AC
  Administered 2017-03-05: 0.4 mg via INTRAVENOUS

## 2017-03-05 MED ORDER — VANCOMYCIN HCL IN DEXTROSE 1-5 GM/200ML-% IV SOLN: 1000.0000 mg | INTRAVENOUS | Status: DC

## 2017-03-05 MED ORDER — SODIUM CHLORIDE 0.9 % IV SOLN
INTRAVENOUS | Status: DC
  Administered 2017-03-05: 23:00:00 via INTRAVENOUS

## 2017-03-05 MED ORDER — SODIUM CHLORIDE 0.9 % IV SOLN: 1.0000 g | INTRAVENOUS | Status: DC

## 2017-03-05 MED ORDER — AMIODARONE HCL IN DEXTROSE 360-4.14 MG/200ML-% IV SOLN
INTRAVENOUS | Status: AC
  Administered 2017-03-05: 60 mg/h
  Filled 2017-03-05: qty 200

## 2017-03-05 MED ORDER — CALCIUM CHLORIDE 10 % IV SOLN
INTRAVENOUS | Status: AC | PRN
  Administered 2017-03-05 (×2): 1 g via INTRAVENOUS

## 2017-03-05 MED ORDER — SODIUM CHLORIDE 0.9 % IV BOLUS (SEPSIS)
500.0000 mL | Freq: Once | INTRAVENOUS | Status: AC
  Administered 2017-03-05: 500 mL via INTRAVENOUS

## 2017-03-05 MED ORDER — DEXTROSE 50 % IV SOLN
25.0000 mL | INTRAVENOUS | Status: DC | PRN
  Filled 2017-03-05: qty 50

## 2017-03-05 MED ORDER — SODIUM CHLORIDE 0.9 % IV BOLUS (SEPSIS): 1000.0000 mL | Freq: Once | INTRAVENOUS | Status: DC

## 2017-03-05 MED ORDER — VANCOMYCIN HCL 10 G IV SOLR
1750.0000 mg | Freq: Once | INTRAVENOUS | Status: AC
  Administered 2017-03-05: 1750 mg via INTRAVENOUS
  Filled 2017-03-05: qty 1750

## 2017-03-05 MED ORDER — ALBUTEROL (5 MG/ML) CONTINUOUS INHALATION SOLN
INHALATION_SOLUTION | RESPIRATORY_TRACT | Status: AC
  Administered 2017-03-05: 10 mg/h via RESPIRATORY_TRACT
  Filled 2017-03-05: qty 20

## 2017-03-05 MED ORDER — NALOXONE HCL 2 MG/2ML IJ SOSY
PREFILLED_SYRINGE | INTRAMUSCULAR | Status: AC
  Filled 2017-03-05: qty 2

## 2017-03-05 MED ORDER — INSULIN REGULAR HUMAN 100 UNIT/ML IJ SOLN
INTRAMUSCULAR | Status: DC
  Administered 2017-03-05: 5.4 [IU]/h via INTRAVENOUS
  Administered 2017-03-05: 21.6 [IU]/h via INTRAVENOUS
  Filled 2017-03-05 (×3): qty 1

## 2017-03-05 MED ORDER — DEXTROSE 5 % IV SOLN
INTRAVENOUS | Status: DC
  Administered 2017-03-05 – 2017-03-06 (×2): via INTRAVENOUS

## 2017-03-05 MED ORDER — MAGNESIUM SULFATE 50 % IJ SOLN
INTRAMUSCULAR | Status: AC | PRN
  Administered 2017-03-05: 2 g via INTRAVENOUS

## 2017-03-05 MED ORDER — SODIUM CHLORIDE 0.9 % IV SOLN: INTRAVENOUS | Status: AC

## 2017-03-05 MED ORDER — SODIUM CHLORIDE 0.9 % IV SOLN
500.0000 mg | INTRAVENOUS | Status: DC
  Administered 2017-03-05: 500 mg via INTRAVENOUS
  Filled 2017-03-05: qty 0.5

## 2017-03-05 MED ORDER — IPRATROPIUM-ALBUTEROL 0.5-2.5 (3) MG/3ML IN SOLN: 3.0000 mL | Freq: Once | RESPIRATORY_TRACT | Status: DC

## 2017-03-05 MED ORDER — INSULIN REGULAR BOLUS VIA INFUSION
0.0000 [IU] | Freq: Three times a day (TID) | INTRAVENOUS | Status: DC
  Filled 2017-03-05: qty 10

## 2017-03-05 MED ORDER — FUROSEMIDE 10 MG/ML IJ SOLN
100.0000 mg | INTRAVENOUS | Status: AC
  Administered 2017-03-05: 100 mg via INTRAVENOUS
  Filled 2017-03-05: qty 10

## 2017-03-05 NOTE — Progress Notes (Signed)
CRITICAL VALUE ALERT  Critical Value:  5.6 K+, Glucose 824  Date & Time Notied: 03/05/17 15:50  Provider Notified: Dr. Milon Dikes  Orders Received/Actions taken: Resuming glucostabilizer

## 2017-03-05 NOTE — ED Provider Notes (Signed)
.  Central Line Date/Time: 03/05/2017 9:45 AM Performed by: Pattricia Boss, MD Authorized by: Pattricia Boss, MD   Consent:    Consent obtained:  Emergent situation Pre-procedure details:    Hand hygiene: Hand hygiene performed prior to insertion     Sterile barrier technique: All elements of maximal sterile technique followed     Skin preparation:  ChloraPrep   Skin preparation agent: Skin preparation agent completely dried prior to procedure   Anesthesia (see MAR for exact dosages):    Anesthesia method:  Local infiltration   Local anesthetic:  Lidocaine 1% w/o epi Procedure details:    Location:  R femoral   Site selection rationale:  Emergent   Patient position:  Flat   Procedural supplies:  Triple lumen   Catheter size:  7.5 Fr   Landmarks identified: yes     Ultrasound guidance: yes     Sterile ultrasound techniques: Sterile gel and sterile probe covers were used     Number of attempts:  1   Successful placement: yes   Post-procedure details:    Post-procedure:  Dressing applied and line sutured   Assessment:  Blood return through all ports   Patient tolerance of procedure:  Tolerated well, no immediate complications      Pattricia Boss, MD 03/05/17 [phone]

## 2017-03-05 NOTE — Progress Notes (Signed)
CRITICAL VALUE ALERT  Critical Value:  6.9K+, Glucose 918  Date & Time Notied:  03/05/17 13:18  Provider Notified: Dr. Milon Dikes  Orders Received/Actions taken: Insulin gtt to 38ml/hr, re-assess
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
## 2017-03-05 NOTE — ED Provider Notes (Signed)
[facility] EMERGENCY DEPARTMENT Provider Note   CSN: [id number] Arrival date & time: 03/05/17  1962     History   Chief Complaint Chief Complaint  Patient presents with  . post CPR    HPI Edward Colon is a 36 y.o. male.  HPI Patient reports that yesterday he had a couple of episodes of vomiting.  He reports he did see some blood in the vomit.  He denies he was having diarrhea.  He missed yesterday's dialysis but had gone to his 2 prior sessions.  He reports he suddenly felt that he just could not move today and he was incredibly weak.  He could not lift up his arms or his legs and felt intensely weak.  He called family members who called EMS.  EMS arrival they report patient was awake and appeared ill but spontaneous respirations.  They moved him into the truck for transport and patient went into V. Tach.  The pulse.  They cardioverted once and the patient went into torsade with 2 subsequent shocks.  Patient received calcium chloride, sodium bicarbonate and epinephrine x2 as well as chest compressions.  They attempted intubation but patient's jaws were clenched.  They supported airway with nasal trumpets and facemask.  Reportedly he received 20 minutes of CPR and had return of pulses.  On arrival patient is breathing spontaneously he is responding to simple commands and oriented to situation. Past Medical History:  Diagnosis Date  . Anemia   . Blind left eye since ~ 2010  . Depression   . Diabetic peripheral neuropathy ([place])   . ESRD (end stage renal disease) on dialysis [facility])    "TTS; [facility]; [facility]" (02/01/2016)  . Heart murmur    denies any problems with it  . Hypertension   . Seizures ([place])    "last one was end of 2016; they are related to my diabetes" (02/01/2016)  . Type 1 diabetes ([place]) dx'd 1990    Patient Active Problem List   Diagnosis Date Noted  . DKA (diabetic ketoacidoses) ([place]) 03/05/2017  . Involuntary commitment 01/29/2016  .  Suicide attempt by substance overdose ([place]) 01/28/2016  . ESRD (end stage renal disease) ([place]) 03/07/2015  . Hyperglycemia 03/06/2015  . Acute on chronic renal failure ([place]) 02/27/2015  . URI (upper respiratory infection) 02/27/2015  . Acute-on-chronic kidney injury ([place]) 02/27/2015  . Rhabdomyolysis 08/31/2014  . Hypoglycemia 08/30/2014  . Type 1 diabetes ([place]) 08/30/2014  . CKD stage 2 due to type 2 diabetes mellitus ([place]) 08/30/2014  . Anemia 08/30/2014  . Swelling of arm 05/31/2013  . ARF (acute renal failure) ([place]) 05/31/2013  . Hyperkalemia 05/31/2013  . Seizure ([place]) 05/31/2013  . Seizures ([place])   . Hypertension     Past Surgical History:  Procedure Laterality Date  . AV FISTULA PLACEMENT Right 03/03/2015   Procedure: RIGHT RADIO-CEPHALIC ARTERIOVENOUS (AV) FISTULA CREATION;  Surgeon: Serafina Mitchell, MD;  Location: [facility] OR;  Service: Vascular;  Laterality: Right;  . AV FISTULA PLACEMENT Left 06/15/2015   Procedure: INSERTION OF LEFT UPPER ARM  ARTERIOVENOUS (AV) 68mm x 50cm GORE-TEX GRAFT;  Surgeon: Elam Dutch, MD;  Location: [facility];  Service: Vascular;  Laterality: Left;  . BASCILIC VEIN TRANSPOSITION Left 04/01/2015   Procedure: BASCILIC VEIN TRANSPOSITION-LEFT ARM- FIRST STAGE;  Surgeon: Elam Dutch, MD;  Location: [facility];  Service: Vascular;  Laterality: Left;  . EYE SURGERY Right ~ 2010   for diabetic retinopathy  . INSERTION OF DIALYSIS  CATHETER Right 03/03/2015   Procedure: INSERTION OF DIALYSIS CATHETER;  Surgeon: Serafina Mitchell, MD;  [facility];  Service: Vascular;  Laterality: Right;       Home Medications    Prior to Admission medications   Medication Sig Start Date End Date Taking? Authorizing Provider  amLODipine (NORVASC) 5 MG tablet Take 5 mg by mouth daily.    [provider]  calcium acetate (PHOSLO) 667 MG capsule Take 1 capsule (667 mg total) by mouth 3 (three) times daily with meals. Patient taking differently: Take 1,334-2,668  mg by mouth See admin instructions. Take 4 capsules (2668 mg) by mouth with meals and 2 capsules (1334 mg) with snacks 03/07/15   [name], MD  FLUoxetine (PROZAC) 20 MG capsule Take 1 capsule (20 mg total) by mouth daily. 02/04/16   Domenic Polite, MD  hydrOXYzine (ATARAX/VISTARIL) 25 MG tablet Take 1 tablet (25 mg total) by mouth at bedtime as needed and may repeat dose one time if needed for anxiety (insomnia.). 02/03/16   Domenic Polite, MD  Insulin Glargine (LANTUS SOLOSTAR) 100 UNIT/ML Solostar Pen Inject 35 Units into the skin every morning. And pen needles 1/day 12/14/16   Renato Shin, MD  METOPROLOL SUCCINATE PO Take by mouth. Patient unsure of dosage.    [provider]  multivitamin (RENA-VIT) TABS tablet Take 1 tablet by mouth at bedtime. Patient taking differently: Take 1 tablet by mouth daily.  03/07/15   [name], MD    Family History Family History  Problem Relation Age of Onset  . Diabetes Neg Hx     Social History Social History   Tobacco Use  . Smoking status: Never Smoker  . Smokeless tobacco: Never Used  Substance Use Topics  . Alcohol use: Yes    Comment: 02/01/2016 "might have 1 drink/year"  . Drug use: No     Allergies   Patient has no known allergies.   Review of Systems Review of Systems Level 5 caveat cannot obtain due to patient condition  Physical Exam Updated Vital Signs BP (!) 170/81   Pulse 100   Temp (!) 95.7 F (35.4 C)   Resp 18   Ht 5\' 11"  (1.803 m)   Wt 93 kg (205 lb)   SpO2 100%   BMI 28.59 kg/m   Physical Exam  Constitutional:  Patient is well-nourished well-developed.  He has spontaneous respirations without significant respiratory distress.  Patient is weak and fatigued in appearance but making appropriate responses for situation.  HENT:  Head: Normocephalic and atraumatic.  Patient has bilateral nasal trumpet in place.  He has a small amount of red blood on the tongue and apparent tongue  laceration.  Good dentition.  With mouth opening posterior oral airway is patent and no pooling secretions.  Eyes:  Pupils are 2 mm and symmetric.  Extraocular motions are conjugate.  Neck: Neck supple.  Cardiovascular:  To auscultation heart rate is approximately 120.  No gross rub murmur gallop.  Auscultation difficult with surrounding noise.  Radial pulses are palpable.  Pulmonary/Chest:  Patient is breathing spontaneously.  No gross rhonchi or rale.  He is following instructions for taking deep inspiration with good airflow.  Abdominal: Soft. He exhibits no distension. There is no tenderness. There is no guarding.  Musculoskeletal:  Extremities are in good condition with no apparent trauma or injury.  No significant peripheral edema.  Calves are soft and symmetric.  Musculature is symmetric and well-developed.  Dialysis fistula on left upper extremity.  Neurological:  Upon initial arrival patient is somewhat somnolent\obtunded.  He however answers with one-word questions and follows commands for deep inspiration and assist in movements.  After patient has been resuscitated and reassessed ental status at 10: 05.  Patient is alert and appropriate.  He is speaking in full sentences with normal cognitive function.  He follows all commands appropriately with no focal neurologic deficit.  Skin: Skin is warm and dry.  Psychiatric: He has a normal mood and affect.     ED Treatments / Results  Labs (all labs ordered are listed, but only abnormal results are displayed) Labs Reviewed  CBC WITH DIFFERENTIAL/PLATELET - Abnormal; Notable for the following components:      Result Value   RBC 3.80 (*)    Hemoglobin 10.9 (*)    HCT 35.5 (*)    All other components within normal limits  COMPREHENSIVE METABOLIC PANEL - Abnormal; Notable for the following components:   Sodium 129 (*)    Potassium 6.7 (*)    Chloride 83 (*)    CO2 11 (*)    Glucose, Bld 893 (*)    BUN 78 (*)    Creatinine, Ser  15.48 (*)    AST 229 (*)    ALT 207 (*)    Total Bilirubin 1.7 (*)    GFR calc non Af Amer 4 (*)    GFR calc Af Amer 4 (*)    Anion gap 35 (*)    All other components within normal limits  I-STAT CG4 LACTIC ACID, ED - Abnormal; Notable for the following components:   Lactic Acid, Venous 11.43 (*)    All other components within normal limits  I-STAT CHEM 8, ED - Abnormal; Notable for the following components:   Sodium 127 (*)    Potassium 6.6 (*)    Chloride 93 (*)    BUN 70 (*)    Creatinine, Ser 15.80 (*)    Glucose, Bld >700 (*)    Calcium, Ion 1.11 (*)    TCO2 14 (*)    Hemoglobin 12.2 (*)    HCT 36.0 (*)    All other components within normal limits  CBG MONITORING, ED - Abnormal; Notable for the following components:   Glucose-Capillary >600 (*)    All other components within normal limits  CBG MONITORING, ED - Abnormal; Notable for the following components:   Glucose-Capillary >600 (*)    All other components within normal limits  CULTURE, BLOOD (ROUTINE X 2)  CULTURE, BLOOD (ROUTINE X 2)  URINE CULTURE  PROTIME-INR  URINALYSIS, ROUTINE W REFLEX MICROSCOPIC  BLOOD GAS, VENOUS  MAGNESIUM  PHOSPHORUS  DRUG SCREEN 10 W/CONF, SERUM  POTASSIUM  HIV ANTIBODY (ROUTINE TESTING)  CBC  BASIC METABOLIC PANEL  BASIC METABOLIC PANEL  BASIC METABOLIC PANEL  BASIC METABOLIC PANEL  INFLUENZA PANEL BY PCR (TYPE A & B)  I-STAT TROPONIN, ED  I-STAT CG4 LACTIC ACID, ED  I-STAT CG4 LACTIC ACID, ED  I-STAT CG4 LACTIC ACID, ED    EKG  EKG Interpretation  Date/Time:  Sunday March 05 2017 09:08:10 EST Ventricular Rate:  71 PR Interval:    QRS Duration: 229 QT Interval:  660 QTC Calculation: 718 R Axis:   -111 Text Interpretation:  Atrial fibrillation RBBB and LAFB agree. widened intervals. hyperkalemia Confirmed by Charlesetta Shanks [id number]) on 03/05/2017 10:16:02 AM       Radiology Dg Chest Port 1 View  Result Date: 03/05/2017 CLINICAL DATA:  Cardiac arrest EXAM:  PORTABLE CHEST  1 VIEW COMPARISON:  10/28/2016 FINDINGS: Mild cardiac enlargement. No pleural effusions or edema. Lungs are clear. No airspace opacities identified. IMPRESSION: 1. No active disease. 2. Cardiac enlargement. Electronically Signed   By: Kerby Moors M.D.   On: 03/05/2017 08:59    Procedures Procedures (including critical care time) (09:20) patient began widening of QRS and V. tach on monitor with regular rhythm.  Patient was perfusing with awake mental status and palpable peripheral pulses.  Repeat treatment for hyperkalemia administered by push calcium chloride, sodium bicarbonate and 2 g of magnesium IV.  Patient responded well with correction to narrow complex rhythm.  He has maintained awake and alert mental status throughout.  He denies chest pain during these episodes.  Will re-page intensivist to assess the patient.  Nephrology PA-C bedside.  CRITICAL CARE Performed by: Si Gaul   Total critical care time: 60 minutes  Critical care time was exclusive of separately billable procedures and treating other patients.  Critical care was necessary to treat or prevent imminent or life-threatening deterioration.  Critical care was time spent personally by me on the following activities: development of treatment plan with patient and/or surrogate as well as nursing, discussions with consultants, evaluation of patient's response to treatment, examination of patient, obtaining history from patient or surrogate, ordering and performing treatments and interventions, ordering and review of laboratory studies, ordering and review of radiographic studies, pulse oximetry and re-evaluation of patient's condition.  Angiocath insertion Performed by: Si Gaul  Consent: Emergent need for IV placement.  Preparation: Patient was prepped and draped in the usual sterile fashion.  Vein Location: Left external jugular.   Gauge: 18  Normal blood return and flush without  difficulty Patient tolerance: Patient tolerated the procedure well with no immediate complications.    Separate procedure performed by Dr. Pryor Curia.  See her notes for CENTRAL LINE placement. Medications Ordered in ED Medications  calcium chloride 1 g in sodium chloride 0.9 % 100 mL IVPB (not administered)  naloxone (NARCAN) injection 1 mg (1 mg Intravenous Given 03/05/17 0801)  sodium chloride 0.9 % bolus 1,000 mL (not administered)    And  sodium chloride 0.9 % bolus 1,000 mL (1,000 mLs Intravenous New Bag/Given 03/05/17 0844)    And  sodium chloride 0.9 % bolus 1,000 mL (not administered)  dextrose 5 %-0.45 % sodium chloride infusion (not administered)  insulin regular bolus via infusion 0-10 Units (not administered)  insulin regular (NOVOLIN R,HUMULIN R) 100 Units in sodium chloride 0.9 % 100 mL (1 Units/mL) infusion (5.4 Units/hr Intravenous New Bag/Given 03/05/17 0940)  dextrose 50 % solution 25 mL (not administered)  0.9 %  sodium chloride infusion (not administered)  vancomycin (VANCOCIN) 1,750 mg in sodium chloride 0.9 % 500 mL IVPB (1,750 mg Intravenous New Bag/Given 03/05/17 0837)  piperacillin-tazobactam (ZOSYN) IVPB 3.375 g (not administered)  fentaNYL (SUBLIMAZE) 100 MCG/2ML injection (not administered)  albuterol (PROVENTIL,VENTOLIN) solution continuous neb (10 mg/hr Nebulization New Bag/Given 03/05/17 0929)  amiodarone (NEXTERONE PREMIX) 360-4.14 MG/200ML-% (1.8 mg/mL) IV infusion (not administered)  furosemide (LASIX) 100 mg in dextrose 5 % 50 mL IVPB (not administered)  0.9 %  sodium chloride infusion (not administered)  insulin regular (NOVOLIN R,HUMULIN R) 100 Units in sodium chloride 0.9 % 100 mL (1 Units/mL) infusion (not administered)  heparin injection 5,000 Units (not administered)  0.9 %  sodium chloride infusion (not administered)  dextrose 5 %-0.45 % sodium chloride infusion (not administered)  naloxone (NARCAN) injection 0.4 mg (0.4 mg Intravenous Given  03/05/17  0750)  sodium chloride 0.9 % bolus 500 mL (500 mLs Intravenous New Bag/Given 03/05/17 0801)  piperacillin-tazobactam (ZOSYN) IVPB 3.375 g (3.375 g Intravenous New Bag/Given 03/05/17 0826)  albuterol (PROVENTIL) (2.5 MG/3ML) 0.083% nebulizer solution 2.5 mg (2.5 mg Nebulization Given 03/05/17 0915)  amiodarone (CORDARONE) 150 mg in dextrose 5 % 100 mL bolus (150 mg Intravenous New Bag/Given 03/05/17 0934)  calcium chloride injection (1 g Intravenous Given 03/05/17 0930)  magnesium sulfate (IV Push/IM) injection (2 g Intravenous Given 03/05/17 0925)  sodium bicarbonate injection (50 mEq Intravenous Given 03/05/17 0937)     Initial Impression / Assessment and Plan / ED Course  I have reviewed the triage vital signs and the nursing notes.  Pertinent labs & imaging results that were available during my care of the patient were reviewed by me and considered in my medical decision making (see chart for details).    Consult: (08: 20) reviewed with Dr. Burnett Sheng of nephrology.  We will see the patient in consult. Consult:(08:30) Intensivist will see in ED. Final Clinical Impressions(s) / ED Diagnoses   Final diagnoses:  Cardiac arrest ([place])  Hyperkalemia  ESRD (end stage renal disease) on dialysis ([place])  Diabetic ketoacidosis without coma associated with type 1 diabetes mellitus ([place])   Patient presents with critical condition.  As outlined had cardiac arrest on route.  Appears most likely secondary to hyperkalemia.  Patient has required multiple treatments for hyperkalemia with positive response in terms of QRS narrowing and rhythm correction.  Since to the emergency department patient has maintained peripheral pulses.  Mental status improved significantly since arrival.  Patient was also hypothermic and reported becoming ill starting yesterday.  There was concern for possible sepsis as primary etiology for patient's symptoms and decompensation.  Sepsis protocol initiated.  As well,  hyperkalemia treatment initiated with bolus of calcium chloride, sodium bicarbonate and continuous albuterol nebulized solution.  She also received magnesium 2 g IV.  Consultations have been done in the emergency department by nephrology and intensivist.  At this time, patient will be to ICU for continued management.  ED Discharge Orders    None       Charlesetta Shanks, MD 03/05/17 1023

## 2017-03-05 NOTE — Progress Notes (Signed)
PHARMACY NOTE:  ANTIMICROBIAL RENAL DOSAGE ADJUSTMENT  Current antimicrobial regimen includes a mismatch between antimicrobial dosage and estimated renal function.  As per policy approved by the Pharmacy & Therapeutics and Medical Executive Committees, the antimicrobial dosage will be adjusted accordingly.  Current antimicrobial dosage:  Meropenem 1 gm Q 24   Indication: Sepsis   Renal Function:  Estimated Creatinine Clearance: 7 mL/min (A) (by C-G formula based on SCr of 15.8 mg/dL (H)). [x]      On intermittent HD, scheduled: Usual TTS []      On CRRT    Antimicrobial dosage has been changed to:  Meropenem 500 mg every 24 hours Patient received 1 dose of Zosyn in ED so will schedule Merrem to start this evening.   Additional comments:   Thank you for allowing pharmacy to be a part of this patient's care.  Jimmy Footman, PharmD, BCPS PGY2 Infectious Diseases Pharmacy Resident Phone: Rhunette Croft [phone]  03/05/2017 12:05 PM

## 2017-03-05 NOTE — Progress Notes (Signed)
K+ coming down w/ insulin Rx, 5.1 last checked.  BP's stable, vol good, does not require IVF"s for DKA Rx because he is ESRD, just insulin.  Have ordered D5W at 65/ hr for when BS < 250.  Plan HD tomorrow.     Kelly Splinter MD [employer] pager [phone]   03/05/2017, 7:12 PM

## 2017-03-05 NOTE — Consult Note (Signed)
[facility] Renal Consultation Note    Indication for Consultation:  Management of ESRD/hemodialysis; anemia, hypertension/volume and secondary hyperparathyroidism PCP: Dr. Ezekiel Ina  HPI: Edward Colon is a 36 y.o. male with ESRD on hemodialysis T,Th, S at [facility] PMH of DMT1 since 55, HTN, seizure disorder. Patient presented to ED this AM in DKA, S/P cardiac arrest. Patient says he got up this AM and felt like he couldn't lift his head or legs because he was so weak. Called his girlfriend who called 911. In transient to ED, he had cardiac arrest with CPR X 20 minutes before ROSC. He was found to be in [facility] with pulse but was cardioverted X 1, EMS reported torsades with shock X 2. They were unable to intubate patient. Upon arrival to ED, BS 893, K+ 6.7. Co2 11 Anion gap 35, WBC 7.6 HGB 10.9 PLT 307 Lactic acid 11.43  EKG shows RBBB with elevated T waves.  R. Femoral central line was placed in ED and he has rec'd 2 amps Ca Chloride, 2 amps NAHCO3, 2 grams of magnesium, albuterol. He has been started on insulin drip. He flips between SR with T wave elevation to wide complex tachycardia. Critical care physician is at bedside.   Patient is awake, alert, oriented X 3. He denies chest pain, SOB. Says he missed HD D/T issues with his car. Says he did not drink or eat "crazy" because he knew he'd skipped treatment. He denies use of illicit substances. Did not miss doses of insulin. Says he has been feeling fine but said he had nausea and vomiting with bloody emesis X 2 yesterday afternoon. Denies bloody or tarry stools, abdominal pain, flank pain, fever, chills, says that nothing else has been seemed wrong or out of ordinary. He missed HD 03/04/17 but otherwise has ran mostly full treatments for past three treatments.   Past Medical History:  Diagnosis Date  . Anemia   . Blind left eye since ~ 2010  . Depression   . Diabetic peripheral neuropathy ([place])    . ESRD (end stage renal disease) on dialysis [facility])    "TTS; [place]; [facility]" (02/01/2016)  . Heart murmur    denies any problems with it  . Hypertension   . Seizures ([place])    "last one was end of 2016; they are related to my diabetes" (02/01/2016)  . Type 1 diabetes ([place]) dx'd 1990   Past Surgical History:  Procedure Laterality Date  . AV FISTULA PLACEMENT Right 03/03/2015   Procedure: RIGHT RADIO-CEPHALIC ARTERIOVENOUS (AV) FISTULA CREATION;  Surgeon: Serafina Mitchell, MD;  Location: [facility] OR;  Service: Vascular;  Laterality: Right;  . AV FISTULA PLACEMENT Left 06/15/2015   Procedure: INSERTION OF LEFT UPPER ARM  ARTERIOVENOUS (AV) 30mm x 50cm GORE-TEX GRAFT;  Surgeon: Elam Dutch, MD;  Location: [place];  Service: Vascular;  Laterality: Left;  . BASCILIC VEIN TRANSPOSITION Left 04/01/2015   Procedure: BASCILIC VEIN TRANSPOSITION-LEFT ARM- FIRST STAGE;  Surgeon: Elam Dutch, MD;  Location: [place];  Service: Vascular;  Laterality: Left;  . EYE SURGERY Right ~ 2010   for diabetic retinopathy  . INSERTION OF DIALYSIS CATHETER Right 03/03/2015   Procedure: INSERTION OF DIALYSIS CATHETER;  Surgeon: Serafina Mitchell, MD;  Location: [facility] OR;  Service: Vascular;  Laterality: Right;   Family History  Problem Relation Age of Onset  . Diabetes Neg Hx    Social History:  reports that  has never smoked. he has  never used smokeless tobacco. He reports that he drinks alcohol. He reports that he does not use drugs. No Known Allergies Prior to Admission medications   Medication Sig Start Date End Date Taking? Authorizing Provider  amLODipine (NORVASC) 5 MG tablet Take 5 mg by mouth daily.    [provider]  calcium acetate (PHOSLO) 667 MG capsule Take 1 capsule (667 mg total) by mouth 3 (three) times daily with meals. Patient taking differently: Take 1,334-2,668 mg by mouth See admin instructions. Take 4 capsules (2668 mg) by mouth with meals and 2 capsules (1334 mg) with snacks  03/07/15   [name], MD  FLUoxetine (PROZAC) 20 MG capsule Take 1 capsule (20 mg total) by mouth daily. 02/04/16   Domenic Polite, MD  hydrOXYzine (ATARAX/VISTARIL) 25 MG tablet Take 1 tablet (25 mg total) by mouth at bedtime as needed and may repeat dose one time if needed for anxiety (insomnia.). 02/03/16   Domenic Polite, MD  Insulin Glargine (LANTUS SOLOSTAR) 100 UNIT/ML Solostar Pen Inject 35 Units into the skin every morning. And pen needles 1/day 12/14/16   Renato Shin, MD  METOPROLOL SUCCINATE PO Take by mouth. Patient unsure of dosage.    [provider]  multivitamin (RENA-VIT) TABS tablet Take 1 tablet by mouth at bedtime. Patient taking differently: Take 1 tablet by mouth daily.  03/07/15   [name], MD   Current Facility-Administered Medications  Medication Dose Route Frequency Provider Last Rate Last Dose  . amiodarone (NEXTERONE PREMIX) 360-4.14 MG/200ML-% (1.8 mg/mL) IV infusion           . 0.9 %  sodium chloride infusion   Intravenous Continuous Pfeiffer, Marcy, MD      . albuterol (PROVENTIL,VENTOLIN) solution continuous neb  10 mg/hr Nebulization Continuous Charlesetta Shanks, MD 2 mL/hr at 03/05/17 0929 10 mg/hr at 03/05/17 0929  . calcium chloride 1 g in sodium chloride 0.9 % 100 mL IVPB  1 g Intravenous Once Pfeiffer, Jeannie Done, MD      . dextrose 5 %-0.45 % sodium chloride infusion   Intravenous Continuous Pfeiffer, Marcy, MD      . dextrose 50 % solution 25 mL  25 mL Intravenous PRN Pfeiffer, Jeannie Done, MD      . fentaNYL (SUBLIMAZE) 100 MCG/2ML injection           . insulin regular (NOVOLIN R,HUMULIN R) 100 Units in sodium chloride 0.9 % 100 mL (1 Units/mL) infusion   Intravenous Continuous Pfeiffer, Marcy, MD      . insulin regular bolus via infusion 0-10 Units  0-10 Units Intravenous TID WC Pfeiffer, Marcy, MD      . naloxone (NARCAN) injection 1 mg  1 mg Intravenous PRN Charlesetta Shanks, MD   1 mg at 03/05/17 0801  . piperacillin-tazobactam (ZOSYN)  IVPB 3.375 g  3.375 g Intravenous Q12H Dang, Thuy D, RPH      . sodium chloride 0.9 % bolus 1,000 mL  1,000 mL Intravenous Once Charlesetta Shanks, MD       And  . sodium chloride 0.9 % bolus 1,000 mL  1,000 mL Intravenous Once Charlesetta Shanks, MD      . vancomycin (VANCOCIN) 1,750 mg in sodium chloride 0.9 % 500 mL IVPB  1,750 mg Intravenous Once Tyrone Apple, RPH 250 mL/hr at 03/05/17 6301 1,750 mg at 03/05/17 6010   Current Outpatient Medications  Medication Sig Dispense Refill  . amLODipine (NORVASC) 5 MG tablet Take 5 mg by mouth daily.    . calcium acetate (  PHOSLO) 667 MG capsule Take 1 capsule (667 mg total) by mouth 3 (three) times daily with meals. (Patient taking differently: Take [phone] mg by mouth See admin instructions. Take 4 capsules (2668 mg) by mouth with meals and 2 capsules (1334 mg) with snacks) 90 capsule 0  . FLUoxetine (PROZAC) 20 MG capsule Take 1 capsule (20 mg total) by mouth daily. 20 capsule 0  . hydrOXYzine (ATARAX/VISTARIL) 25 MG tablet Take 1 tablet (25 mg total) by mouth at bedtime as needed and may repeat dose one time if needed for anxiety (insomnia.). 15 tablet 0  . Insulin Glargine (LANTUS SOLOSTAR) 100 UNIT/ML Solostar Pen Inject 35 Units into the skin every morning. And pen needles 1/day 5 pen PRN  . METOPROLOL SUCCINATE PO Take by mouth. Patient unsure of dosage.    . multivitamin (RENA-VIT) TABS tablet Take 1 tablet by mouth at bedtime. (Patient taking differently: Take 1 tablet by mouth daily. ) 30 tablet 0   Labs: Basic Metabolic Panel: Recent Labs  Lab 03/05/17 0749 03/05/17 0752  NA 129* 127*  K 6.7* 6.6*  CL 83* 93*  CO2 11*  --   GLUCOSE 893* >700*  BUN 78* 70*  CREATININE 15.48* 15.80*  CALCIUM 9.8  --    Liver Function Tests: Recent Labs  Lab 03/05/17 0749  AST 229*  ALT 207*  ALKPHOS 102  BILITOT 1.7*  PROT 7.5  ALBUMIN 3.8   No results for input(s): LIPASE, AMYLASE in the last 168 hours. No results for input(s): AMMONIA in  the last 168 hours. CBC: Recent Labs  Lab 03/05/17 0749 03/05/17 0752  WBC 7.6  --   NEUTROABS 5.7  --   HGB 10.9* 12.2*  HCT 35.5* 36.0*  MCV 93.4  --   PLT 307  --    Cardiac Enzymes: No results for input(s): CKTOTAL, CKMB, CKMBINDEX, TROPONINI in the last 168 hours. CBG: Recent Labs  Lab 03/05/17 0754  GLUCAP >600*   Iron Studies: No results for input(s): IRON, TIBC, TRANSFERRIN, FERRITIN in the last 72 hours. Studies/Results: Dg Chest Port 1 View  Result Date: 03/05/2017 CLINICAL DATA:  Cardiac arrest EXAM: PORTABLE CHEST 1 VIEW COMPARISON:  10/28/2016 FINDINGS: Mild cardiac enlargement. No pleural effusions or edema. Lungs are clear. No airspace opacities identified. IMPRESSION: 1. No active disease. 2. Cardiac enlargement. Electronically Signed   By: Kerby Moors M.D.   On: 03/05/2017 08:59    ROS: As per HPI otherwise negative.   Physical Exam: Vitals:   03/05/17 0740 03/05/17 0803 03/05/17 0929  BP: (!) 158/82    Pulse: (!) 140    Resp: 14    Temp:  (!) 95.7 F (35.4 C)   TempSrc: Rectal    SpO2: 100%  100%  Weight:  93 kg (205 lb)   Height:  5\' 11"  (1.803 m)      General: Well developed, well nourished, in no acute distress. Head: Normocephalic, atraumatic, sclera non-icteric, oral bleeding noted, sm lac on side of tongue from attempted intubation.  Neck: Supple. JVD not elevated. Lungs: Clear bilaterally to auscultation without wheezes, rales, or rhonchi. Breathing is unlabored. Heart: RRR with S1 S2. 2/6 systolic M. No rubs, or gallops appreciated. Abdomen: Soft, non-tender, non-distended with normoactive bowel sounds. No rebound/guarding. No obvious abdominal masses. M-S:  Strength and tone appear normal for age. Lower extremities:without edema or ischemic changes, no open wounds  Neuro: Alert and oriented X 3. Moves all extremities spontaneously. Psych:  Responds to questions appropriately with  a normal affect. Dialysis Access: LUA AVG +  bruit  Dialysis Orders: [place] T,Th,S 4 hr 15 min 180 NRe 500/800 90 kg 2.0 K/2.5 Ca  -Heparin 6800 units IV TIW -Hectorol 5 mcg IV TIW -Mircera 75 mcg IV q 2 weeks (last dose 02/21/2017 HGB 10.9 03/02/17)   Assessment/Plan: 1.  S/P cardiac arrest: Awake, alert on 100% NRB. No neuro deficits noted.  2.  DKA: AG 35 BS 893. Started on insulin gtt.  3.  Hyperkalemia: K+ 6.7 has received Ca+ chloride, bicarb. Started on insulin drip. Awaiting repeat labs prior to making decision for HD.  K+ will come down with insulin Rx, will repeat in about 2 hrs.  Would not dialyze him for 2 reasons, he is in an unstable cardiac state (WCT off and on, sp arrest) and also the K+ will come down w/ insulin Rx alone. 4.  Wide complex tachycardia: perfusing arrhythmia-has rec'd magnesium and currently amiodarone 150 mg IV. Per primary 5. Elevated liver enzymes: ALT, AST and bili elevated. S/P CPR. Per primary 6.  ESRD - T,Th, S via LUA AVG. Will monitor labs and decide on whether to dialyze pt for hyperkalemia if K+ fails to normalize after treating insulin deficit.  7.  Hypertension/volume  - BP and volume controlled, no evidence of volume overload by exam. Pt still urinates. On amlodipine 10 mg PO q hs, Minoxidil 2.5 mg po q hs, losartan 100 mg PO q HS.  8.  Anemia  - HGB 10.9 ESA due this week-give with HD.  9.  Metabolic bone disease - Ca 9.8 Continue binders/VDRA.  10.  Nutrition - Albumin 3.8 NPO at present 11. DM: as noted above. Per primary 12. Seizure disorder: No AED medications on OP med list.  13. H/O depression  Rita H. Owens Shark, NP-C 03/05/2017, 9:41 AM  [employer] [phone]  Pt seen, examined, agree w assess/plan as above with additions as indicated.  Kelly Splinter MD [employer] pager [phone]    cell [phone] 03/05/2017, 10:52 AM

## 2017-03-05 NOTE — Progress Notes (Signed)
Insulin gtt rate override per MD. Insulin gtt at 1ml/hr as patient not responding to gluco stabilizer. Will recheck BMET and CBG in one hour. Patient vitals within normal range, patient in no distress at this time.

## 2017-03-05 NOTE — H&P (Addendum)
PULMONARY / CRITICAL CARE MEDICINE   Name: Edward Colon MRN: [id number] DOB: Feb 14, 1981    ADMISSION DATE:  03/05/2017 CONSULTATION DATE: March 05, 2017  REFERRING MD:.  ED staff  CHIEF COMPLAINT: Post cardiac arrest feeling weak  HISTORY OF PRESENT ILLNESS:    This is a 36 year old African-American male past medical history of type 1 diabetes brittle diabetes hypertension end-stage renal disease on hemodialysis history of suicidal attempt no history of seizures who presents after he was feeling weak short of breath called EMS and the EMS patient went into V. Tach. Patient missed dialysis yesterday and he was feeling weak EMS picked him up he became unresponsive had V. tach with pulse on cardioverted by 112 total was checked by 2  patient received calcium chloride sodium bicarb and a 2 x 2 he was noticed to be responsive but did try to intubate him and he was assisting that in the ED patient is interactive awake responsive the report time for the code was around 20 minutes??????  PAST MEDICAL HISTORY :  He  has a past medical history of Anemia, Blind left eye (since ~ 2010), Depression, Diabetic peripheral neuropathy ([place]), ESRD (end stage renal disease) on dialysis ([place]), Heart murmur, Hypertension, Seizures ([place]), and Type 1 diabetes ([place]) (dx'd 1990).  PAST SURGICAL HISTORY: He  has a past surgical history that includes Insertion of dialysis catheter (Right, 03/03/2015); AV fistula placement (Right, 03/03/2015); Bascilic vein transposition (Left, 04/01/2015); AV fistula placement (Left, 06/15/2015); and Eye surgery (Right, ~ 2010).  No Known Allergies  No current facility-administered medications on file prior to encounter.    Current Outpatient Medications on File Prior to Encounter  Medication Sig  . amLODipine (NORVASC) 5 MG tablet Take 5 mg by mouth daily.  . calcium acetate (PHOSLO) 667 MG capsule Take 1 capsule (667 mg total) by mouth 3 (three) times daily with meals.  (Patient taking differently: Take [phone] mg by mouth See admin instructions. Take 4 capsules (2668 mg) by mouth with meals and 2 capsules (1334 mg) with snacks)  . FLUoxetine (PROZAC) 20 MG capsule Take 1 capsule (20 mg total) by mouth daily.  . hydrOXYzine (ATARAX/VISTARIL) 25 MG tablet Take 1 tablet (25 mg total) by mouth at bedtime as needed and may repeat dose one time if needed for anxiety (insomnia.).  . Insulin Glargine (LANTUS SOLOSTAR) 100 UNIT/ML Solostar Pen Inject 35 Units into the skin every morning. And pen needles 1/day  . METOPROLOL SUCCINATE PO Take by mouth. Patient unsure of dosage.  . multivitamin (RENA-VIT) TABS tablet Take 1 tablet by mouth at bedtime. (Patient taking differently: Take 1 tablet by mouth daily. )    FAMILY HISTORY:  His indicated that his mother is alive. He indicated that his father is alive. He indicated that the status of his neg hx is unknown.   SOCIAL HISTORY: He  reports that  has never smoked. he has never used smokeless tobacco. He reports that he drinks alcohol. He reports that he does not use drugs.  REVIEW OF SYSTEMS:   Patient has been feeling sick for couple of days just feeling weak and tired  SUBJECTIVE:  Generalized weakness  VITAL SIGNS: BP (!) 170/81   Pulse 100   Temp (!) 95.7 F (35.4 C)   Resp 18   Ht 5\' 11"  (1.803 m)   Wt 205 lb (93 kg)   SpO2 100%   BMI 28.59 kg/m   HEMODYNAMICS:    VENTILATOR SETTINGS:    INTAKE /  OUTPUT: No intake/output data recorded.  PHYSICAL EXAMINATION: General: In mild acute distress from shortness of breath Neuro:  WNL , AOX3 , EOMI , CN II-XII intact , UL , LL strength is symmetrical and 5/5 HEENT:  atraumatic , no jaundice , dry mucous membranes  Cardiovascular:  Irregular irregular , ESM 2/6 in the aortic area  Lungs:  CTA bilateral , no wheezing or crackles  Abdomen:  Soft lax +BS , no tenderness . Musculoskeletal:  WNL , normal pulses  Skin:  No rash     LABS:  BMET Recent Labs  Lab 03/05/17 0749 03/05/17 0752  NA 129* 127*  K 6.7* 6.6*  CL 83* 93*  CO2 11*  --   BUN 78* 70*  CREATININE 15.48* 15.80*  GLUCOSE 893* >700*    Electrolytes Recent Labs  Lab 03/05/17 0749  CALCIUM 9.8    CBC Recent Labs  Lab 03/05/17 0749 03/05/17 0752  WBC 7.6  --   HGB 10.9* 12.2*  HCT 35.5* 36.0*  PLT 307  --     Coag's Recent Labs  Lab 03/05/17 0749  INR 1.15    Sepsis Markers Recent Labs  Lab 03/05/17 0754  LATICACIDVEN 11.43*    ABG No results for input(s): PHART, PCO2ART, PO2ART in the last 168 hours.  Liver Enzymes Recent Labs  Lab 03/05/17 0749  AST 229*  ALT 207*  ALKPHOS 102  BILITOT 1.7*  ALBUMIN 3.8    Cardiac Enzymes No results for input(s): TROPONINI, PROBNP in the last 168 hours.  Glucose Recent Labs  Lab 03/05/17 0754 03/05/17 0941  GLUCAP >600* >600*    Imaging Dg Chest Port 1 View  Result Date: 03/05/2017 CLINICAL DATA:  Cardiac arrest EXAM: PORTABLE CHEST 1 VIEW COMPARISON:  10/28/2016 FINDINGS: Mild cardiac enlargement. No pleural effusions or edema. Lungs are clear. No airspace opacities identified. IMPRESSION: 1. No active disease. 2. Cardiac enlargement. Electronically Signed   By: Kerby Moors M.D.   On: 03/05/2017 08:59       DISCUSSION: This is a 36 year old African-American male with end-stage renal disease hemodialysis brittle diabetes type 1 presents with DKA post code sustained V. tach post CPR for 20 minutes on amiodarone drip possible infection that triggered all of this.  ASSESSMENT / PLAN:  PULMONARY A: Patient is tachypneic but this is expected after his course with lactic acidosis of 11 this point.  We will start him on broad-spectrum antibiotic since he has been feeling weak and sick for the past 2 days given his DKA and end-stage renal disease he does not have typical presentation for sepsis.    CARDIOVASCULAR Sustained V. tach he had dilated  cardiomyopathy or cardiomegaly on the x-ray will order echo start him on routine cardiology evaluation once he is out of all of those metabolite derangements.  RENAL Hyperkalemia associated with acidosis and electrolyte disturbances and hyponatremia indicative of volume overload at this point we will give the patient large dose of Lasix and 100 mg still makes urine nephrologist but the bedside they are debating with dialysis.  Patient received 10 mg of albuterol shifting treatment with calcium gluconate and Kayexalate for the hyperkalemia nephrology involved they are debating about the dialysis depending on his next set of labs.  GASTROINTESTINAL N.p.o.  HEMATOLOGIC Start patient on antibiotics  INFECTIOUS Swab for the flu will start him on antibiotic  ENDOCRINE DKA protocol he has brittle diabetes  NEUROLOGIC Patient had cold with perfusion no evidence of neural injury no hypothermia protocol. This  point patient had history of seizure but no active seizures and is not on medication for that is certainly.   Critical care time 35 minutes    Pulmonary and [place] Pager: [phone]  03/05/2017, 10:11 AM

## 2017-03-05 NOTE — Progress Notes (Signed)
Pharmacy Antibiotic Note  Edward Colon is a 36 y.o. male admitted on 03/05/2017 with DKA and s/p cardiac arrest.  Pharmacy has been consulted for vancomycin and Zosyn dosing for sepsis.  Patient has ESRD on TTS HD; however, he missed yesterday's session.  Afebrile, WBC WNL, LA 11.43   Plan: Vanc 1750mg  IV x 1, then 1gm IV q-HD TTS Zosyn 3.375gm IV Q12H, 4 hr infusion Monitor HD schedule, clinical progress, vanc level as indicated   Height: 5\' 11"  (180.3 cm) Weight: 205 lb (93 kg) IBW/kg (Calculated) : 75.3  Temp (24hrs), Avg:95.7 F (35.4 C), Min:95.7 F (35.4 C), Max:95.7 F (35.4 C)  Recent Labs  Lab 03/05/17 0749 03/05/17 0752 03/05/17 0754  WBC 7.6  --   --   CREATININE  --  15.80*  --   LATICACIDVEN  --   --  11.43*    Estimated Creatinine Clearance: 7.7 mL/min (A) (by C-G formula based on SCr of 15.8 mg/dL (H)).    No Known Allergies   Vanc 12/30 >> Zosyn 12/30 >>  12/30 BCx -  12/30 UCx -    Teletha Petrea D. Mina Marble, PharmD, BCPS Pager:  [phone] 03/05/2017, 8:33 AM

## 2017-03-06 ENCOUNTER — Inpatient Hospital Stay (HOSPITAL_COMMUNITY): Payer: Medicare Other

## 2017-03-06 DIAGNOSIS — E1122 Type 2 diabetes mellitus with diabetic chronic kidney disease: Secondary | ICD-10-CM | POA: Diagnosis not present

## 2017-03-06 DIAGNOSIS — Z992 Dependence on renal dialysis: Secondary | ICD-10-CM | POA: Diagnosis not present

## 2017-03-06 DIAGNOSIS — E0910 Drug or chemical induced diabetes mellitus with ketoacidosis without coma: Secondary | ICD-10-CM

## 2017-03-06 DIAGNOSIS — N186 End stage renal disease: Secondary | ICD-10-CM | POA: Diagnosis not present

## 2017-03-06 DIAGNOSIS — I361 Nonrheumatic tricuspid (valve) insufficiency: Secondary | ICD-10-CM

## 2017-03-06 LAB — COMPREHENSIVE METABOLIC PANEL
ALBUMIN: 3.1 g/dL — AB (ref 3.5–5.0)
ALK PHOS: 75 U/L (ref 38–126)
ALT: 122 U/L — AB (ref 17–63)
AST: 78 U/L — AB (ref 15–41)
Anion gap: 14 (ref 5–15)
BILIRUBIN TOTAL: 0.8 mg/dL (ref 0.3–1.2)
BUN: 83 mg/dL — AB (ref 6–20)
CO2: 26 mmol/L (ref 22–32)
CREATININE: 15.25 mg/dL — AB (ref 0.61–1.24)
Calcium: 9.1 mg/dL (ref 8.9–10.3)
Chloride: 96 mmol/L — ABNORMAL LOW (ref 101–111)
GFR calc Af Amer: 4 mL/min — ABNORMAL LOW (ref >60)
GFR, EST NON AFRICAN AMERICAN: 4 mL/min — AB (ref >60)
GLUCOSE: 72 mg/dL (ref 65–99)
POTASSIUM: 4.5 mmol/L (ref 3.5–5.1)
Sodium: 136 mmol/L (ref 135–145)
TOTAL PROTEIN: 6.4 g/dL — AB (ref 6.5–8.1)

## 2017-03-06 LAB — BASIC METABOLIC PANEL
Anion gap: 16 — ABNORMAL HIGH (ref 5–15)
Anion gap: 14 (ref 5–15)
BUN: 82 mg/dL — AB (ref 6–20)
BUN: 39 mg/dL — AB (ref 6–20)
CALCIUM: 8.9 mg/dL (ref 8.9–10.3)
CHLORIDE: 95 mmol/L — AB (ref 101–111)
CO2: 24 mmol/L (ref 22–32)
CO2: 27 mmol/L (ref 22–32)
CREATININE: 10.08 mg/dL — AB (ref 0.61–1.24)
Calcium: 9 mg/dL (ref 8.9–10.3)
Chloride: 95 mmol/L — ABNORMAL LOW (ref 101–111)
Creatinine, Ser: 15.44 mg/dL — ABNORMAL HIGH (ref 0.61–1.24)
GFR calc Af Amer: 4 mL/min — ABNORMAL LOW (ref >60)
GFR calc non Af Amer: 4 mL/min — ABNORMAL LOW (ref >60)
GFR calc non Af Amer: 6 mL/min — ABNORMAL LOW (ref >60)
GFR, EST AFRICAN AMERICAN: 7 mL/min — AB (ref >60)
GLUCOSE: 120 mg/dL — AB (ref 65–99)
Glucose, Bld: 187 mg/dL — ABNORMAL HIGH (ref 65–99)
POTASSIUM: 5.9 mmol/L — AB (ref 3.5–5.1)
Potassium: 4.9 mmol/L (ref 3.5–5.1)
SODIUM: 136 mmol/L (ref 135–145)
Sodium: 135 mmol/L (ref 135–145)

## 2017-03-06 LAB — CBC WITH DIFFERENTIAL/PLATELET
BASOS ABS: 0 10*3/uL (ref 0.0–0.1)
BASOS PCT: 0 %
EOS ABS: 0 10*3/uL (ref 0.0–0.7)
Eosinophils Relative: 0 %
HEMATOCRIT: 25.8 % — AB (ref 39.0–52.0)
Hemoglobin: 8.7 g/dL — ABNORMAL LOW (ref 13.0–17.0)
Lymphocytes Relative: 15 %
Lymphs Abs: 1.8 10*3/uL (ref 0.7–4.0)
MCH: 28.8 pg (ref 26.0–34.0)
MCHC: 33.7 g/dL (ref 30.0–36.0)
MCV: 85.4 fL (ref 78.0–100.0)
MONO ABS: 0.8 10*3/uL (ref 0.1–1.0)
Monocytes Relative: 7 %
NEUTROS ABS: 9 10*3/uL — AB (ref 1.7–7.7)
Neutrophils Relative %: 78 %
PLATELETS: 214 10*3/uL (ref 150–400)
RBC: 3.02 MIL/uL — ABNORMAL LOW (ref 4.22–5.81)
RDW: 15.5 % (ref 11.5–15.5)
WBC: 11.6 10*3/uL — ABNORMAL HIGH (ref 4.0–10.5)

## 2017-03-06 LAB — GLUCOSE, CAPILLARY
GLUCOSE-CAPILLARY: 68 mg/dL (ref 65–99)
GLUCOSE-CAPILLARY: 92 mg/dL (ref 65–99)
GLUCOSE-CAPILLARY: 82 mg/dL (ref 65–99)
GLUCOSE-CAPILLARY: 89 mg/dL (ref 65–99)
GLUCOSE-CAPILLARY: 134 mg/dL — AB (ref 65–99)
GLUCOSE-CAPILLARY: 219 mg/dL — AB (ref 65–99)
Glucose-Capillary: 123 mg/dL — ABNORMAL HIGH (ref 65–99)
Glucose-Capillary: 130 mg/dL — ABNORMAL HIGH (ref 65–99)
Glucose-Capillary: 158 mg/dL — ABNORMAL HIGH (ref 65–99)
Glucose-Capillary: 176 mg/dL — ABNORMAL HIGH (ref 65–99)
Glucose-Capillary: 76 mg/dL (ref 65–99)
Glucose-Capillary: 95 mg/dL (ref 65–99)

## 2017-03-06 LAB — BLOOD CULTURE ID PANEL (REFLEXED)
Acinetobacter baumannii: NOT DETECTED
CANDIDA GLABRATA: NOT DETECTED
CANDIDA TROPICALIS: NOT DETECTED
Candida albicans: NOT DETECTED
Candida krusei: NOT DETECTED
Candida parapsilosis: NOT DETECTED
Enterobacter cloacae complex: NOT DETECTED
Enterobacteriaceae species: NOT DETECTED
Enterococcus species: NOT DETECTED
Escherichia coli: NOT DETECTED
HAEMOPHILUS INFLUENZAE: NOT DETECTED
KLEBSIELLA PNEUMONIAE: NOT DETECTED
Klebsiella oxytoca: NOT DETECTED
Listeria monocytogenes: NOT DETECTED
METHICILLIN RESISTANCE: NOT DETECTED
NEISSERIA MENINGITIDIS: NOT DETECTED
PROTEUS SPECIES: NOT DETECTED
Pseudomonas aeruginosa: NOT DETECTED
STAPHYLOCOCCUS SPECIES: DETECTED — AB
STREPTOCOCCUS SPECIES: NOT DETECTED
Serratia marcescens: NOT DETECTED
Staphylococcus aureus (BCID): NOT DETECTED
Streptococcus agalactiae: NOT DETECTED
Streptococcus pneumoniae: NOT DETECTED
Streptococcus pyogenes: NOT DETECTED

## 2017-03-06 LAB — PROTIME-INR
INR: 1.13
Prothrombin Time: 14.4 seconds (ref 11.4–15.2)

## 2017-03-06 LAB — HIV ANTIBODY (ROUTINE TESTING W REFLEX): HIV Screen 4th Generation wRfx: NONREACTIVE

## 2017-03-06 LAB — APTT: aPTT: 35 seconds (ref 24–36)

## 2017-03-06 LAB — URINE CULTURE: CULTURE: NO GROWTH

## 2017-03-06 LAB — MAGNESIUM: MAGNESIUM: 3 mg/dL — AB (ref 1.7–2.4)

## 2017-03-06 LAB — ECHOCARDIOGRAM COMPLETE
Height: 71 in
WEIGHTICAEL: 3312.19 [oz_av]

## 2017-03-06 MED ORDER — INSULIN ASPART 100 UNIT/ML ~~LOC~~ SOLN
0.0000 [IU] | Freq: Three times a day (TID) | SUBCUTANEOUS | Status: DC
  Administered 2017-03-06: 5 [IU] via SUBCUTANEOUS
  Administered 2017-03-07: 11 [IU] via SUBCUTANEOUS

## 2017-03-06 MED ORDER — INSULIN GLARGINE 100 UNIT/ML ~~LOC~~ SOLN
20.0000 [IU] | Freq: Every day | SUBCUTANEOUS | Status: DC
  Administered 2017-03-06 – 2017-03-07 (×2): 20 [IU] via SUBCUTANEOUS
  Filled 2017-03-06 (×2): qty 0.2

## 2017-03-06 MED ORDER — IBUPROFEN 200 MG PO TABS
400.0000 mg | ORAL_TABLET | Freq: Four times a day (QID) | ORAL | Status: DC | PRN
  Administered 2017-03-06: 400 mg via ORAL
  Filled 2017-03-06: qty 2

## 2017-03-06 MED ORDER — INSULIN ASPART 100 UNIT/ML ~~LOC~~ SOLN: 0.0000 [IU] | Freq: Every day | SUBCUTANEOUS | Status: DC

## 2017-03-06 MED ORDER — CALCIUM ACETATE (PHOS BINDER) 667 MG PO CAPS
667.0000 mg | ORAL_CAPSULE | Freq: Three times a day (TID) | ORAL | Status: DC
  Administered 2017-03-06 – 2017-03-08 (×6): 667 mg via ORAL
  Filled 2017-03-06 (×7): qty 1

## 2017-03-06 MED FILL — Medication: Qty: 1 | Status: AC

## 2017-03-06 NOTE — Plan of Care (Signed)
  Progressing Clinical Measurements: Diagnostic test results will improve 03/06/2017 0646 - Progressing by Acasia Skilton A, RN Note Patient remains on stabilizer , CBG below 150- awaiting Anion gap to close. Continuing to monitor CBGs every hour and check BMET every 4 hours.

## 2017-03-06 NOTE — Progress Notes (Signed)
PULMONARY / CRITICAL CARE MEDICINE   Name: Edward Colon MRN: [id number] DOB: [birth year]    ADMISSION DATE:  03/05/2017 CONSULTATION DATE:  03/05/2017  REFERRING MD:  Jeanell Sparrow  CHIEF COMPLAINT:  Cardiac arrest  HISTORY OF PRESENT ILLNESS:   36 y/o male with ESRD and Type I DM admitted with cardiac arrest in the setting of DKA and hyperkalemia.    PAST MEDICAL HISTORY :  He  has a past medical history of Anemia, Blind left eye (since ~ 2010), Depression, Diabetic peripheral neuropathy ([place]), ESRD (end stage renal disease) on dialysis ([place]), Heart murmur, Hypertension, Seizures ([place]), and Type 1 diabetes ([place]) (dx'd 1990).   SUBJECTIVE:  Generalized weakness  VITAL SIGNS: BP 133/80   Pulse 91   Temp 98.4 F (36.9 C)   Resp 14   Ht 5\' 11"  (1.803 m)   Wt 87.9 kg (193 lb 12.6 oz)   SpO2 100%   BMI 27.03 kg/m   HEMODYNAMICS:    VENTILATOR SETTINGS:    INTAKE / OUTPUT: I/O last 3 completed shifts: In: 2322.1 [I.V.:1262.1; IV Piggyback:1060] Out: 425 [Urine:425]  PHYSICAL EXAMINATION:  General:  Resting comfortably in bed HENT: NCAT OP clear PULM: CTA B, normal effort CV: RRR, no mgr GI: BS+, soft, nontender MSK: normal bulk and tone Neuro: awake, alert, no distress, MAEW   LABS:  BMET Recent Labs  Lab 03/05/17 2134 03/06/17 0205 03/06/17 0600  NA 135 136 135  K 4.9 4.5 5.9*  CL 95* 96* 95*  CO2 25 26 24   BUN 82* 83* 82*  CREATININE 15.05* 15.25* 15.44*  GLUCOSE 353* 72 120*    Electrolytes Recent Labs  Lab 03/05/17 0810 03/05/17 1230  03/05/17 2134 03/06/17 0205 03/06/17 0600  CALCIUM  --  9.7   < > 9.5 9.1 9.0  MG 3.8*  --   --   --  3.0*  --   PHOS 9.2* 9.0*  --   --   --   --    < > = values in this interval not displayed.    CBC Recent Labs  Lab 03/05/17 0749 03/05/17 0752 03/05/17 0950 03/06/17 0256  WBC 7.6  --  16.1* 11.6*  HGB 10.9* 12.2* 10.3* 8.7*  HCT 35.5* 36.0* 34.3* 25.8*  PLT 307  --  287 214    Coag's Recent  Labs  Lab 03/05/17 0749 03/06/17 0205  APTT  --  35  INR 1.15 1.13    Sepsis Markers Recent Labs  Lab 03/05/17 0754 03/05/17 1030  LATICACIDVEN 11.43* 13.09*    ABG Recent Labs  Lab 03/05/17 1023  PHART 7.078*  PCO2ART 48.9*  PO2ART 46.0*    Liver Enzymes Recent Labs  Lab 03/05/17 0749 03/05/17 1230 03/06/17 0205  AST 229*  --  78*  ALT 207*  --  122*  ALKPHOS 102  --  75  BILITOT 1.7*  --  0.8  ALBUMIN 3.8 3.4* 3.1*    Cardiac Enzymes No results for input(s): TROPONINI, PROBNP in the last 168 hours.  Glucose Recent Labs  Lab 03/06/17 0228 03/06/17 0331 03/06/17 0442 03/06/17 0550 03/06/17 0656 03/06/17 0744  GLUCAP 92 82 89 123* 130* 134*    Imaging Dg Chest Port 1 View  Result Date: 03/06/2017 CLINICAL DATA:  Acute respiratory failure EXAM: PORTABLE CHEST 1 VIEW COMPARISON:  Portable chest x-ray of March 05, 2017 FINDINGS: The lungs are adequately inflated. There is no focal infiltrate. There is no pleural effusion. The heart is top-normal in  size. The pulmonary vascularity is normal. External pacemaker defibrillator pads are present. IMPRESSION: There is no pneumonia, CHF, nor other acute cardiopulmonary abnormality. Electronically Signed   By: David  Martinique M.D.   On: 03/06/2017 07:05   Dg Chest Portable 1 View  Result Date: 03/05/2017 CLINICAL DATA:  Central line placement EXAM: PORTABLE CHEST 1 VIEW COMPARISON:  03/05/17 FINDINGS: Mild cardiac enlargement. No pleural effusion or edema identified. No airspace opacities identified. No central venous catheters identified. IMPRESSION: 1. No active disease. 2. No central venous catheter visualized. Electronically Signed   By: Kerby Moors M.D.   On: 03/05/2017 11:14   Dg Chest Port 1 View  Result Date: 03/05/2017 CLINICAL DATA:  Cardiac arrest EXAM: PORTABLE CHEST 1 VIEW COMPARISON:  10/28/2016 FINDINGS: Mild cardiac enlargement. No pleural effusions or edema. Lungs are clear. No airspace  opacities identified. IMPRESSION: 1. No active disease. 2. Cardiac enlargement. Electronically Signed   By: Kerby Moors M.D.   On: 03/05/2017 08:59      DISCUSSION: 35 y/o male with Type I DM2 admitted with a cardiac arrest in the setting of DKA and Hyperkalemia after missing HD.    ASSESSMENT / PLAN:  PULMONARY A: No acute issues P:   Monitor respiratory status  CARDIOVASCULAR A:  Cardiac arrest from hyperkaleami P:  Tele Monitor hemodynamics  RENAL A:   ESRD Hyperkalemia> better Anion gap metabolic acidosis > due to DKA, likely hyperphosphatemia P:   HD now See endocrine Monitor BMET and UOP Replace electrolytes as needed F/u renal recs  GASTROINTESTINAL A:   No acute issues P:   Advance diet to diabetic diet today  HEMATOLOGIC A:   Anemia without bleeding P:  Monitor for bleeding  INFECTIOUS A:   Coag neg staph in 2/4 bottles, contaminant P:   Monitor for fever Stop antibiotics  ENDOCRINE A:   Type 1 DM with DKA P:   Stop DKA protocol Give glargine now Start SSI AC/HS Diabetic diet Repeat BMET post dialysis to monitor anion gap  NEUROLOGIC A:   Depression P:   Restart prozac if patient confirms he is taking  FAMILY  - Updates: none bedside   Move to floor, TRH service   Roselie Awkward, MD [facility] Pager: [phone] Cell: [phone] After 3pm or if no response, call [phone]   03/06/2017, 8:04 AM

## 2017-03-06 NOTE — Progress Notes (Signed)
[facility] Progress Note Patient Name: Edward Colon DOB: [birth year] MRN: [id number]   Date of Service  03/06/2017  HPI/Events of Note  Patient c/0 headache. Requests Motrin. ESRD.  eICU Interventions  Will order: 1. Motrin 400 mg PO Q 6 hours PRN headache.      Intervention Category Major Interventions: Other:  Lysle Dingwall 03/06/2017, 6:43 AM

## 2017-03-06 NOTE — Progress Notes (Signed)
  Echocardiogram 2D Echocardiogram has been performed.  Edward Colon 03/06/2017, 2:27 PM

## 2017-03-06 NOTE — Progress Notes (Signed)
Inpatient Diabetes Program Recommendations  AACE/ADA: New Consensus Statement on Inpatient Glycemic Control (2015)  Target Ranges:  Prepandial:   less than 140 mg/dL      Peak postprandial:   less than 180 mg/dL (1-2 hours)      Critically ill patients:  140 - 180 mg/dL   Lab Results  Component Value Date   GLUCAP 76 03/06/2017   HGBA1C 9.7 12/14/2016    Review of Glycemic Control Results for Edward Colon, Edward Colon (MRN [id number]) as of 03/06/2017 15:10  Ref. Range 03/06/2017 05:50 03/06/2017 06:56 03/06/2017 07:44 03/06/2017 09:03 03/06/2017 13:32  Glucose-Capillary Latest Ref Range: 65 - 99 mg/dL 123 (H) 130 (H) 134 (H) 158 (H) 76   Diabetes history: DM1 Outpatient Diabetes medications: Lantus 35 units  Current orders for Inpatient glycemic control: Lantus 20 units + Novolog moderate correction scale + hs 0-5 units  Inpatient Diabetes Program Recommendations:   -Decrease Novolog to sensitive and change to q 4 hrs while in ICU and this will add a 2 am CBG  Patient is seen by endocrinologist Dr. Renato Shin and was last seen in his office 12/14/16 and patient was changed from NPH to Lantus insulin.  Thank you, Nani Gasser. Aleyssa Pike, RN, MSN, CDE  Diabetes Coordinator Inpatient Glycemic Control Team Team Pager [phone] (8am-5pm) 03/06/2017 3:13 PM

## 2017-03-06 NOTE — CV Procedure (Signed)
2D echo attempted but patient in dialysis 953a and 1228p. Will try later

## 2017-03-06 NOTE — Progress Notes (Signed)
CBG is 95, Insulin gtt rate is 0.7. Per protocol MD was notified.  Dr. Oletta Darter aware.

## 2017-03-06 NOTE — Procedures (Signed)
I was present at this dialysis session. I have reviewed the session itself and made appropriate changes. Initially ordered to keep even, however he is 6kg above edw and BP stable.  Will attempt UF of 2 liters and follow bp.   Filed Weights   03/05/17 0803 03/05/17 1122 03/06/17 0825  Weight: 93 kg (205 lb) 87.9 kg (193 lb 12.6 oz) 96.3 kg (212 lb 4.9 oz)    Recent Labs  Lab 03/05/17 1230  03/06/17 0600  NA 130*   < > 135  K 6.9*   < > 5.9*  CL 87*   < > 95*  CO2 11*   < > 24  GLUCOSE 918*   < > 120*  BUN 84*   < > 82*  CREATININE 15.18*   < > 15.44*  CALCIUM 9.7   < > 9.0  PHOS 9.0*  --   --    < > = values in this interval not displayed.    Recent Labs  Lab 03/05/17 0749 03/05/17 0752 03/05/17 0950 03/06/17 0256  WBC 7.6  --  16.1* 11.6*  NEUTROABS 5.7  --   --  9.0*  HGB 10.9* 12.2* 10.3* 8.7*  HCT 35.5* 36.0* 34.3* 25.8*  MCV 93.4  --  94.8 85.4  PLT 307  --  287 214    Scheduled Meds: . amLODipine  10 mg Oral Daily  . insulin aspart  0-15 Units Subcutaneous TID WC  . insulin aspart  0-5 Units Subcutaneous QHS  . insulin glargine  20 Units Subcutaneous Daily   Continuous Infusions: . albuterol 10 mg/hr (03/05/17 0929)  . calcium chloride    . dextrose 30 mL (03/06/17 0800)   PRN Meds:.ibuprofen, naLOXone (NARCAN)  injection   Donetta Potts,  MD 03/06/2017, 9:00 AM

## 2017-03-06 NOTE — Progress Notes (Signed)
PHARMACY - PHYSICIAN COMMUNICATION CRITICAL VALUE ALERT - BLOOD CULTURE IDENTIFICATION (BCID)  Edward Colon is an 36 y.o. male who presented to [facility] on 03/05/2017 post cardiac arrest.   Assessment:  Tmax 99.1, WBC 11.6, LA 13.09. ESRD on HD. Blood culture with GPCs in clusters 2/2 bottles.   Name of physician (or Provider) Contacted: Dr. Oletta Darter   Current antibiotics: Vanc 1g IV qHD and Meropenem 500mg  IV q24hr   Changes to prescribed antibiotics recommended: none at this time   Results for orders placed or performed during the hospital encounter of 03/05/17  Blood Culture ID Panel (Reflexed) (Collected: 03/05/2017  8:28 AM)  Result Value Ref Range   Enterococcus species NOT DETECTED NOT DETECTED   Listeria monocytogenes NOT DETECTED NOT DETECTED   Staphylococcus species DETECTED (A) NOT DETECTED   Staphylococcus aureus NOT DETECTED NOT DETECTED   Methicillin resistance NOT DETECTED NOT DETECTED   Streptococcus species NOT DETECTED NOT DETECTED   Streptococcus agalactiae NOT DETECTED NOT DETECTED   Streptococcus pneumoniae NOT DETECTED NOT DETECTED   Streptococcus pyogenes NOT DETECTED NOT DETECTED   Acinetobacter baumannii NOT DETECTED NOT DETECTED   Enterobacteriaceae species NOT DETECTED NOT DETECTED   Enterobacter cloacae complex NOT DETECTED NOT DETECTED   Escherichia coli NOT DETECTED NOT DETECTED   Klebsiella oxytoca NOT DETECTED NOT DETECTED   Klebsiella pneumoniae NOT DETECTED NOT DETECTED   Proteus species NOT DETECTED NOT DETECTED   Serratia marcescens NOT DETECTED NOT DETECTED   Haemophilus influenzae NOT DETECTED NOT DETECTED   Neisseria meningitidis NOT DETECTED NOT DETECTED   Pseudomonas aeruginosa NOT DETECTED NOT DETECTED   Candida albicans NOT DETECTED NOT DETECTED   Candida glabrata NOT DETECTED NOT DETECTED   Candida krusei NOT DETECTED NOT DETECTED   Candida parapsilosis NOT DETECTED NOT DETECTED   Candida tropicalis NOT DETECTED NOT DETECTED    Lavonda Jumbo, PharmD Clinical Pharmacist 03/06/17 3:27 AM

## 2017-03-07 DIAGNOSIS — N170 Acute kidney failure with tubular necrosis: Secondary | ICD-10-CM

## 2017-03-07 DIAGNOSIS — E1010 Type 1 diabetes mellitus with ketoacidosis without coma: Principal | ICD-10-CM

## 2017-03-07 DIAGNOSIS — E0810 Diabetes mellitus due to underlying condition with ketoacidosis without coma: Secondary | ICD-10-CM

## 2017-03-07 DIAGNOSIS — N181 Chronic kidney disease, stage 1: Secondary | ICD-10-CM

## 2017-03-07 DIAGNOSIS — I469 Cardiac arrest, cause unspecified: Secondary | ICD-10-CM

## 2017-03-07 LAB — GLUCOSE, CAPILLARY
GLUCOSE-CAPILLARY: 110 mg/dL — AB (ref 65–99)
GLUCOSE-CAPILLARY: 82 mg/dL (ref 65–99)
GLUCOSE-CAPILLARY: 184 mg/dL — AB (ref 65–99)
Glucose-Capillary: 158 mg/dL — ABNORMAL HIGH (ref 65–99)
Glucose-Capillary: 167 mg/dL — ABNORMAL HIGH (ref 65–99)
Glucose-Capillary: 342 mg/dL — ABNORMAL HIGH (ref 65–99)
Glucose-Capillary: 44 mg/dL — CL (ref 65–99)
Glucose-Capillary: 561 mg/dL (ref 65–99)
Glucose-Capillary: 82 mg/dL (ref 65–99)

## 2017-03-07 MED ORDER — DEXTROSE 50 % IV SOLN
INTRAVENOUS | Status: AC
  Administered 2017-03-07: 16:00:00
  Filled 2017-03-07: qty 50

## 2017-03-07 MED ORDER — INSULIN GLARGINE 100 UNIT/ML ~~LOC~~ SOLN
18.0000 [IU] | Freq: Every day | SUBCUTANEOUS | Status: DC
  Filled 2017-03-07: qty 0.18

## 2017-03-07 MED ORDER — INSULIN GLARGINE 100 UNIT/ML SOLOSTAR PEN: 20.0000 [IU] | PEN_INJECTOR | Freq: Every day | SUBCUTANEOUS | 99 refills | Status: DC

## 2017-03-07 MED ORDER — DEXTROSE 50 % IV SOLN
INTRAVENOUS | Status: AC
  Administered 2017-03-07: 50 mL via INTRAVENOUS
  Filled 2017-03-07: qty 50

## 2017-03-07 MED ORDER — INSULIN ASPART 100 UNIT/ML ~~LOC~~ SOLN
0.0000 [IU] | SUBCUTANEOUS | Status: DC
  Administered 2017-03-07: 2 [IU] via SUBCUTANEOUS
  Administered 2017-03-08: 9 [IU] via SUBCUTANEOUS
  Administered 2017-03-08: 1 [IU] via SUBCUTANEOUS

## 2017-03-07 NOTE — Progress Notes (Addendum)
[facility] PROGRESS NOTE  Edward Colon PPJ:[id number] DOB: Jul 30, 1980 DOA: 03/05/2017   PCP: Nolene Ebbs, MD     Assessment/Plan: Principal Problem:   DKA (diabetic ketoacidoses) ([place]) Active Problems:   Seizures ([place])   Hyperkalemia   Acute on chronic renal failure ([facility])   ESRD (end stage renal disease) ([facility])   Ventricular tachycardia ([place])   37 year old African-American male past medical history of type 1 diabetes brittle diabetes hypertension end-stage renal disease on hemodialysis history of suicidal attempt no history of seizures who presents after he was feeling weak short of breath called EMS and the EMS patient went into [facility] T[facility]. Patient missed dialysis yesterday and he was feeling weak EMS picked him up he became unresponsive had V. tach with pulse on  cardioversion,   patient received calcium chloride sodium bicarb and a 2 x 2 he was noticed to be responsive but did try to intubate him as in ED patient was more  interactive awake, full duration of  code was around 20 minutes   Assessment and plan Cardiac arrest secondary to hyperkalemia and noncompliance with hemodialysis 2-D echo completed, results pending Patient wants to leave AMA without seeing cardiology even if his echo is abnormal and he is willing to risk sudden cardiac death, which was explained to him in presence of RN.  ESRD nephrology following, regular hemodialysis Tuesday Thursday Saturday  Hypertension-stable continue home medications 6  Hyperkalemia-potassium has improved, currently 4.9  Hyperglycemia-patient is followed by Dr. Renato Shin, continue Lantus and sliding scale insulin. DKA resolved. Patient had an episode of unresponsiveness and hypoglycemia and needed d50 today. His SSI has been changed to q 4 hrs and dose of lantus has been reduced .  Depression continue Prozac  Anemia of chronic disease    DVT prophylaxsis  SCDs  Code Status:   Full code   He Family  Communication: Discussed in detail with the patient, all imaging results, lab results explained to the patient   Disposition Plan:  Anticipate discharge tomorrow, continue to monitor on telemetry      Consultants:  Nephrology  Critical care  Procedures:  None  Antibiotics: Anti-infectives (From admission, onward)   Start     Dose/Rate Route Frequency Ordered Stop   03/07/17 1200  vancomycin (VANCOCIN) IVPB 1000 mg/200 mL premix  Status:  Discontinued     1,000 mg 200 mL/hr over 60 Minutes Intravenous Every T-Th-Sa (Hemodialysis) 03/05/17 1105 03/06/17 0836   03/05/17 2000  piperacillin-tazobactam (ZOSYN) IVPB 3.375 g  Status:  Discontinued     3.375 g 12.5 mL/hr over 240 Minutes Intravenous Every 12 hours 03/05/17 0831 03/05/17 1201   03/05/17 2000  meropenem (MERREM) 500 mg in sodium chloride 0.9 % 50 mL IVPB  Status:  Discontinued     500 mg 100 mL/hr over 30 Minutes Intravenous Every 24 hours 03/05/17 1205 03/06/17 0836   03/05/17 1030  meropenem (MERREM) 1 g in sodium chloride 0.9 % 100 mL IVPB  Status:  Discontinued     1 g 200 mL/hr over 30 Minutes Intravenous Every 24 hours 03/05/17 1021 03/05/17 1204   03/05/17 0815  piperacillin-tazobactam (ZOSYN) IVPB 3.375 g     3.375 g 100 mL/hr over 30 Minutes Intravenous  Once 03/05/17 0807 03/05/17 0856   03/05/17 0815  vancomycin (VANCOCIN) IVPB 1000 mg/200 mL premix  Status:  Discontinued     1,000 mg 200 mL/hr over 60 Minutes Intravenous  Once 03/05/17 0807 03/05/17 0809  03/05/17 0815  vancomycin (VANCOCIN) 1,750 mg in sodium chloride 0.9 % 500 mL IVPB     1,750 mg 250 mL/hr over 120 Minutes Intravenous  Once 03/05/17 0809 03/05/17 1200         HPI/Subjective: Patient wants to go home denies any nausea, headache, abdominal pain  Objective: Vitals:   03/07/17 0411 03/07/17 0734 03/07/17 0800 03/07/17 1138  BP: (!) 146/78  (!) 153/93   Pulse:      Resp: 13     Temp:  98.1 F (36.7 C)  98.3 F (36.8 C)   TempSrc:  Oral  Oral  SpO2: 98%     Weight:      Height:        Intake/Output Summary (Last 24 hours) at 03/07/2017 1146 Last data filed at 03/07/2017 0900 Gross per 24 hour  Intake 620 ml  Output 2000 ml  Net -1380 ml    Exam:  Examination:  General exam: Appears calm and comfortable  Respiratory system: Clear to auscultation. Respiratory effort normal. Cardiovascular system: S1 & S2 heard, RRR. No JVD, murmurs, rubs, gallops or clicks. No pedal edema. Gastrointestinal system: Abdomen is nondistended, soft and nontender. No organomegaly or masses felt. Normal bowel sounds heard. Central nervous system: Alert and oriented. No focal neurological deficits. Extremities: Symmetric 5 x 5 power. Skin: No rashes, lesions or ulcers Psychiatry: Judgement and insight appear normal. Mood & affect appropriate.     Data Reviewed: I have personally reviewed following labs and imaging studies  Micro Results Recent Results (from the past 240 hour(s))  Blood Culture (routine x 2)     Status: Abnormal (Preliminary result)   Collection Time: 03/05/17  8:28 AM  Result Value Ref Range Status   Specimen Description BLOOD SITE NOT SPECIFIED  Final   Special Requests   Final    BOTTLES DRAWN AEROBIC AND ANAEROBIC Blood Culture adequate volume   Culture  Setup Time   Final    GRAM POSITIVE COCCI IN CLUSTERS IN BOTH AEROBIC AND ANAEROBIC BOTTLES CRITICAL RESULT CALLED TO, READ BACK BY AND VERIFIED WITH: K.WEIGLE,PHARMD AT 1497 ON 03/06/17 BY G.[email or web address]    Culture (A)  Final    STAPHYLOCOCCUS SPECIES (COAGULASE NEGATIVE) THE SIGNIFICANCE OF ISOLATING THIS ORGANISM FROM A SINGLE SET OF BLOOD CULTURES WHEN MULTIPLE SETS ARE DRAWN IS UNCERTAIN. PLEASE NOTIFY THE MICROBIOLOGY DEPARTMENT WITHIN ONE WEEK IF SPECIATION AND SENSITIVITIES ARE REQUIRED.    Report Status PENDING  Incomplete  Blood Culture ID Panel (Reflexed)     Status: Abnormal   Collection Time: 03/05/17  8:28 AM  Result Value Ref Range  Status   Enterococcus species NOT DETECTED NOT DETECTED Final   Listeria monocytogenes NOT DETECTED NOT DETECTED Final   Staphylococcus species DETECTED (A) NOT DETECTED Final    Comment: Methicillin (oxacillin) susceptible coagulase negative staphylococcus. Possible blood culture contaminant (unless isolated from more than one blood culture draw or clinical case suggests pathogenicity). No antibiotic treatment is indicated for blood  culture contaminants. CRITICAL RESULT CALLED TO, READ BACK BY AND VERIFIED WITH: K.WEIGLE,PHARMD AT 0263 ON 03/06/17 BY G.[email or web address]    Staphylococcus aureus NOT DETECTED NOT DETECTED Final   Methicillin resistance NOT DETECTED NOT DETECTED Final   Streptococcus species NOT DETECTED NOT DETECTED Final   Streptococcus agalactiae NOT DETECTED NOT DETECTED Final   Streptococcus pneumoniae NOT DETECTED NOT DETECTED Final   Streptococcus pyogenes NOT DETECTED NOT DETECTED Final   Acinetobacter baumannii NOT DETECTED NOT DETECTED Final   Enterobacteriaceae species NOT  DETECTED NOT DETECTED Final   Enterobacter cloacae complex NOT DETECTED NOT DETECTED Final   Escherichia coli NOT DETECTED NOT DETECTED Final   Klebsiella oxytoca NOT DETECTED NOT DETECTED Final   Klebsiella pneumoniae NOT DETECTED NOT DETECTED Final   Proteus species NOT DETECTED NOT DETECTED Final   Serratia marcescens NOT DETECTED NOT DETECTED Final   Haemophilus influenzae NOT DETECTED NOT DETECTED Final   Neisseria meningitidis NOT DETECTED NOT DETECTED Final   Pseudomonas aeruginosa NOT DETECTED NOT DETECTED Final   Candida albicans NOT DETECTED NOT DETECTED Final   Candida glabrata NOT DETECTED NOT DETECTED Final   Candida krusei NOT DETECTED NOT DETECTED Final   Candida parapsilosis NOT DETECTED NOT DETECTED Final   Candida tropicalis NOT DETECTED NOT DETECTED Final  MRSA PCR Screening     Status: None   Collection Time: 03/05/17 11:19 AM  Result Value Ref Range Status   MRSA by PCR  NEGATIVE NEGATIVE Final    Comment:        The GeneXpert MRSA Assay (FDA approved for NASAL specimens only), is one component of a comprehensive MRSA colonization surveillance program. It is not intended to diagnose MRSA infection nor to guide or monitor treatment for MRSA infections.   Blood Culture (routine x 2)     Status: None (Preliminary result)   Collection Time: 03/05/17 11:55 AM  Result Value Ref Range Status   Specimen Description BLOOD RIGHT ANTECUBITAL  Final   Special Requests IN PEDIATRIC BOTTLE Blood Culture adequate volume  Final   Culture NO GROWTH < 24 HOURS  Final   Report Status PENDING  Incomplete  Urine culture     Status: None   Collection Time: 03/05/17 12:53 PM  Result Value Ref Range Status   Specimen Description URINE, CLEAN CATCH  Final   Special Requests NONE  Final   Culture NO GROWTH  Final   Report Status 03/06/2017 FINAL  Final    Radiology Reports Dg Chest Port 1 View  Result Date: 03/06/2017 CLINICAL DATA:  Acute respiratory failure EXAM: PORTABLE CHEST 1 VIEW COMPARISON:  Portable chest x-ray of March 05, 2017 FINDINGS: The lungs are adequately inflated. There is no focal infiltrate. There is no pleural effusion. The heart is top-normal in size. The pulmonary vascularity is normal. External pacemaker defibrillator pads are present. IMPRESSION: There is no pneumonia, CHF, nor other acute cardiopulmonary abnormality. Electronically Signed   By: David  Martinique M.D.   On: 03/06/2017 07:05   Dg Chest Portable 1 View  Result Date: 03/05/2017 CLINICAL DATA:  Central line placement EXAM: PORTABLE CHEST 1 VIEW COMPARISON:  03/05/17 FINDINGS: Mild cardiac enlargement. No pleural effusion or edema identified. No airspace opacities identified. No central venous catheters identified. IMPRESSION: 1. No active disease. 2. No central venous catheter visualized. Electronically Signed   By: Kerby Moors M.D.   On: 03/05/2017 11:14   Dg Chest Port 1  View  Result Date: 03/05/2017 CLINICAL DATA:  Cardiac arrest EXAM: PORTABLE CHEST 1 VIEW COMPARISON:  10/28/2016 FINDINGS: Mild cardiac enlargement. No pleural effusions or edema. Lungs are clear. No airspace opacities identified. IMPRESSION: 1. No active disease. 2. Cardiac enlargement. Electronically Signed   By: Kerby Moors M.D.   On: 03/05/2017 08:59     CBC Recent Labs  Lab 03/05/17 0749 03/05/17 0752 03/05/17 0950 03/06/17 0256  WBC 7.6  --  16.1* 11.6*  HGB 10.9* 12.2* 10.3* 8.7*  HCT 35.5* 36.0* 34.3* 25.8*  PLT 307  --  287 214  MCV 93.4  --  94.8 85.4  MCH 28.7  --  28.5 28.8  MCHC 30.7  --  30.0 33.7  RDW 15.5  --  15.3 15.5  LYMPHSABS 1.6  --   --  1.8  MONOABS 0.3  --   --  0.8  EOSABS 0.0  --   --  0.0  BASOSABS 0.0  --   --  0.0    Chemistries  Recent Labs  Lab 03/05/17 0749  03/05/17 0810  03/05/17 1730 03/05/17 2134 03/06/17 0205 03/06/17 0600 03/06/17 1614  NA 129*   < >  --    < > 131* 135 136 135 136  K 6.7*   < >  --    < > 5.1 4.9 4.5 5.9* 4.9  CL 83*   < >  --    < > 94* 95* 96* 95* 95*  CO2 11*  --   --    < > 21* 25 26 24 27   GLUCOSE 893*   < >  --    < > 687* 353* 72 120* 187*  BUN 78*   < >  --    < > 82* 82* 83* 82* 39*  CREATININE 15.48*   < >  --    < > 15.00* 15.05* 15.25* 15.44* 10.08*  CALCIUM 9.8  --   --    < > 9.4 9.5 9.1 9.0 8.9  MG  --   --  3.8*  --   --   --  3.0*  --   --   AST 229*  --   --   --   --   --  78*  --   --   ALT 207*  --   --   --   --   --  122*  --   --   ALKPHOS 102  --   --   --   --   --  75  --   --   BILITOT 1.7*  --   --   --   --   --  0.8  --   --    < > = values in this interval not displayed.   ------------------------------------------------------------------------------------------------------------------ estimated creatinine clearance is 12.1 mL/min (A) (by C-G formula based on SCr of 10.08 mg/dL  (H)). ------------------------------------------------------------------------------------------------------------------ No results for input(s): HGBA1C in the last 72 hours. ------------------------------------------------------------------------------------------------------------------ No results for input(s): CHOL, HDL, LDLCALC, TRIG, CHOLHDL, LDLDIRECT in the last 72 hours. ------------------------------------------------------------------------------------------------------------------ No results for input(s): TSH, T4TOTAL, T3FREE, THYROIDAB in the last 72 hours.  Invalid input(s): FREET3 ------------------------------------------------------------------------------------------------------------------ No results for input(s): VITAMINB12, FOLATE, FERRITIN, TIBC, IRON, RETICCTPCT in the last 72 hours.  Coagulation profile Recent Labs  Lab 03/05/17 0749 03/06/17 0205  INR 1.15 1.13    No results for input(s): DDIMER in the last 72 hours.  Cardiac Enzymes No results for input(s): CKMB, TROPONINI, MYOGLOBIN in the last 168 hours.  Invalid input(s): CK ------------------------------------------------------------------------------------------------------------------ Invalid input(s): POCBNP   CBG: Recent Labs  Lab 03/06/17 1332 03/06/17 1750 03/06/17 2208 03/07/17 0721 03/07/17 1119  GLUCAP 76 219* 176* 342* 110*       Studies: Dg Chest Port 1 View  Result Date: 03/06/2017 CLINICAL DATA:  Acute respiratory failure EXAM: PORTABLE CHEST 1 VIEW COMPARISON:  Portable chest x-ray of March 05, 2017 FINDINGS: The lungs are adequately inflated. There is no focal infiltrate. There is no pleural effusion. The heart is top-normal in size. The pulmonary vascularity  is normal. External pacemaker defibrillator pads are present. IMPRESSION: There is no pneumonia, CHF, nor other acute cardiopulmonary abnormality. Electronically Signed   By: David  Martinique M.D.   On: 03/06/2017 07:05       Lab Results  Component Value Date   HGBA1C 9.7 12/14/2016   HGBA1C 9.7 04/11/2016   HGBA1C 9.1 01/08/2016   Lab Results  Component Value Date   CREATININE 10.08 (H) 03/06/2017       Scheduled Meds: . amLODipine  10 mg Oral Daily  . calcium acetate  667 mg Oral TID WC  . insulin aspart  0-15 Units Subcutaneous TID WC  . insulin aspart  0-5 Units Subcutaneous QHS  . insulin glargine  20 Units Subcutaneous Daily   Continuous Infusions: . albuterol 10 mg/hr (03/05/17 0929)     LOS: 2 days    Time spent: >30 MINS    Reyne Dumas  [facility] Pager [phone]. If 7PM-7AM, please contact night-coverage at [email or web address], password [facility] 03/07/2017, 11:46 AM  LOS: 2 days

## 2017-03-07 NOTE — Discharge Summary (Addendum)
Physician Discharge Summary  Edward Colon MRN: [id number] DOB/AGE: 06-22-1980 37 y.o.  PCP: Nolene Ebbs, MD   Admit date: 03/05/2017 Discharge date: 03/07/2017  Discharge Diagnoses:    Principal Problem:   DKA (diabetic ketoacidoses) ([place]) Active Problems:   Seizures ([place])   Hyperkalemia   Acute on chronic renal failure ([facility])   ESRD (end stage renal disease) ([place])   Ventricular tachycardia ([place])   Cardiac arrest ([place])    Follow-up recommendations Follow-up with PCP in 3-5 days , including all  additional recommended appointments as below Follow-up CBC, CMP in 3-5 days Patient advised to follow up with Renato Shin, his endocrinologist early next week for his labile DM  Patient refused cardiac MRI in house due to claustrophobia , and needs this to be done as an outpatient , suspect compliance will be an issue         Allergies as of 03/07/2017   No Known Allergies     Allergies as of 03/08/2017   No Known Allergies     Medication List    TAKE these medications   calcium acetate 667 MG capsule Commonly known as:  PHOSLO Take 1 capsule (667 mg total) by mouth 3 (three) times daily with meals. What changed:    how much to take  when to take this  additional instructions   FLUoxetine 20 MG capsule Commonly known as:  PROZAC Take 1 capsule (20 mg total) by mouth daily.   hydrOXYzine 25 MG tablet Commonly known as:  ATARAX/VISTARIL Take 1 tablet (25 mg total) by mouth at bedtime as needed and may repeat dose one time if needed for anxiety (insomnia.).   Insulin Glargine 100 UNIT/ML Solostar Pen Commonly known as:  LANTUS SOLOSTAR Inject 20 Units into the skin daily at 10 pm. And pen needles 1/day What changed:    how much to take  when to take this   metoprolol tartrate 25 MG tablet Commonly known as:  LOPRESSOR Take 1 tablet (25 mg total) by mouth every morning.   multivitamin Tabs tablet Take 1 tablet by mouth at bedtime. What changed:   when to take this       Discharge Condition:  Stable  Discharge Instructions Get Medicines reviewed and adjusted: Please take all your medications with you for your next visit with your Primary MD  Please request your Primary MD to go over all hospital tests and procedure/radiological results at the follow up, please ask your Primary MD to get all Hospital records sent to his/her office.  If you experience worsening of your admission symptoms, develop shortness of breath, life threatening emergency, suicidal or homicidal thoughts you must seek medical attention immediately by calling 911 or calling your MD immediately if symptoms less severe.  You must read complete instructions/literature along with all the possible adverse reactions/side effects for all the Medicines you take and that have been prescribed to you. Take any new Medicines after you have completely understood and accpet all the possible adverse reactions/side effects.   Do not drive when taking Pain medications.   Do not take more than prescribed Pain, Sleep and Anxiety Medications  Special Instructions: If you have smoked or chewed Tobacco in the last 2 yrs please stop smoking, stop any regular Alcohol and or any Recreational drug use.  Wear Seat belts while driving.  Please note  You were cared for by a hospitalist during your hospital stay. Once you are discharged, your primary care physician will handle any further medical  issues. Please note that NO REFILLS for any discharge medications will be authorized once you are discharged, as it is imperative that you return to your primary care physician (or establish a relationship with a primary care physician if you do not have one) for your aftercare needs so that they can reassess your need for medications and monitor your lab values.     No Known Allergies    Disposition: 01-Home or Self Care   Consults:  Nephrology Critical care    Significant  Diagnostic Studies:  Dg Chest Port 1 View  Result Date: 03/06/2017 CLINICAL DATA:  Acute respiratory failure EXAM: PORTABLE CHEST 1 VIEW COMPARISON:  Portable chest x-ray of March 05, 2017 FINDINGS: The lungs are adequately inflated. There is no focal infiltrate. There is no pleural effusion. The heart is top-normal in size. The pulmonary vascularity is normal. External pacemaker defibrillator pads are present. IMPRESSION: There is no pneumonia, CHF, nor other acute cardiopulmonary abnormality. Electronically Signed   By: David  Martinique M.D.   On: 03/06/2017 07:05   Dg Chest Portable 1 View  Result Date: 03/05/2017 CLINICAL DATA:  Central line placement EXAM: PORTABLE CHEST 1 VIEW COMPARISON:  03/05/17 FINDINGS: Mild cardiac enlargement. No pleural effusion or edema identified. No airspace opacities identified. No central venous catheters identified. IMPRESSION: 1. No active disease. 2. No central venous catheter visualized. Electronically Signed   By: Kerby Moors M.D.   On: 03/05/2017 11:14   Dg Chest Port 1 View  Result Date: 03/05/2017 CLINICAL DATA:  Cardiac arrest EXAM: PORTABLE CHEST 1 VIEW COMPARISON:  10/28/2016 FINDINGS: Mild cardiac enlargement. No pleural effusions or edema. Lungs are clear. No airspace opacities identified. IMPRESSION: 1. No active disease. 2. Cardiac enlargement. Electronically Signed   By: Kerby Moors M.D.   On: 03/05/2017 08:59    echocardiogram  Study Conclusions  - Left ventricle: The cavity size was normal. There was moderate   concentric hypertrophy. Systolic function was normal. The   estimated ejection fraction was in the range of 50% to 55%.   Diffuse hypokinesis. Features are consistent with a pseudonormal   left ventricular filling pattern, with concomitant abnormal   relaxation and increased filling pressure (grade 2 diastolic   dysfunction). Doppler parameters are consistent with elevated   ventricular end-diastolic filling pressure. -  Aortic valve: There was trivial regurgitation. - Mitral valve: There was trivial regurgitation. - Left atrium: The atrium was mildly dilated. - Right ventricle: Systolic function was normal. - Right atrium: The atrium was normal in size. - Tricuspid valve: There was mild regurgitation. - Pulmonic valve: There was no regurgitation. - Pulmonary arteries: Systolic pressure was within the normal   range. - Inferior vena cava: The vessel was normal in size. - Pericardium, extracardiac: There was no pericardial effusion.  Impressions:  - There is moderate to severe concentric LVH with mild hypokinesis   and hypertrabeculations of the apical segments. A cardiac MRI is   recommended for evaluation of a possible non-compaction   cardiomyopathy.       Filed Weights   03/05/17 1122 03/06/17 0825 03/06/17 1201  Weight: 87.9 kg (193 lb 12.6 oz) 96.3 kg (212 lb 4.9 oz) 93.9 kg (207 lb 0.2 oz)     Microbiology: Recent Results (from the past 240 hour(s))  Blood Culture (routine x 2)     Status: Abnormal (Preliminary result)   Collection Time: 03/05/17  8:28 AM  Result Value Ref Range Status   Specimen Description BLOOD SITE NOT SPECIFIED  Final   Special Requests   Final    BOTTLES DRAWN AEROBIC AND ANAEROBIC Blood Culture adequate volume   Culture  Setup Time   Final    GRAM POSITIVE COCCI IN CLUSTERS IN BOTH AEROBIC AND ANAEROBIC BOTTLES CRITICAL RESULT CALLED TO, READ BACK BY AND VERIFIED WITH: K.WEIGLE,PHARMD AT 1914 ON 03/06/17 BY [email or web address]    Culture (A)  Final    STAPHYLOCOCCUS SPECIES (COAGULASE NEGATIVE) THE SIGNIFICANCE OF ISOLATING THIS ORGANISM FROM A SINGLE SET OF BLOOD CULTURES WHEN MULTIPLE SETS ARE DRAWN IS UNCERTAIN. PLEASE NOTIFY THE MICROBIOLOGY DEPARTMENT WITHIN ONE WEEK IF SPECIATION AND SENSITIVITIES ARE REQUIRED.    Report Status PENDING  Incomplete  Blood Culture ID Panel (Reflexed)     Status: Abnormal   Collection Time: 03/05/17  8:28 AM  Result Value Ref  Range Status   Enterococcus species NOT DETECTED NOT DETECTED Final   Listeria monocytogenes NOT DETECTED NOT DETECTED Final   Staphylococcus species DETECTED (A) NOT DETECTED Final    Comment: Methicillin (oxacillin) susceptible coagulase negative staphylococcus. Possible blood culture contaminant (unless isolated from more than one blood culture draw or clinical case suggests pathogenicity). No antibiotic treatment is indicated for blood  culture contaminants. CRITICAL RESULT CALLED TO, READ BACK BY AND VERIFIED WITH: K.WEIGLE,PHARMD AT 7829 ON 03/06/17 BY [email or web address]    Staphylococcus aureus NOT DETECTED NOT DETECTED Final   Methicillin resistance NOT DETECTED NOT DETECTED Final   Streptococcus species NOT DETECTED NOT DETECTED Final   Streptococcus agalactiae NOT DETECTED NOT DETECTED Final   Streptococcus pneumoniae NOT DETECTED NOT DETECTED Final   Streptococcus pyogenes NOT DETECTED NOT DETECTED Final   Acinetobacter baumannii NOT DETECTED NOT DETECTED Final   Enterobacteriaceae species NOT DETECTED NOT DETECTED Final   Enterobacter cloacae complex NOT DETECTED NOT DETECTED Final   Escherichia coli NOT DETECTED NOT DETECTED Final   Klebsiella oxytoca NOT DETECTED NOT DETECTED Final   Klebsiella pneumoniae NOT DETECTED NOT DETECTED Final   Proteus species NOT DETECTED NOT DETECTED Final   Serratia marcescens NOT DETECTED NOT DETECTED Final   Haemophilus influenzae NOT DETECTED NOT DETECTED Final   Neisseria meningitidis NOT DETECTED NOT DETECTED Final   Pseudomonas aeruginosa NOT DETECTED NOT DETECTED Final   Candida albicans NOT DETECTED NOT DETECTED Final   Candida glabrata NOT DETECTED NOT DETECTED Final   Candida krusei NOT DETECTED NOT DETECTED Final   Candida parapsilosis NOT DETECTED NOT DETECTED Final   Candida tropicalis NOT DETECTED NOT DETECTED Final  MRSA PCR Screening     Status: None   Collection Time: 03/05/17 11:19 AM  Result Value Ref Range Status   MRSA by PCR  NEGATIVE NEGATIVE Final    Comment:        The GeneXpert MRSA Assay (FDA approved for NASAL specimens only), is one component of a comprehensive MRSA colonization surveillance program. It is not intended to diagnose MRSA infection nor to guide or monitor treatment for MRSA infections.   Blood Culture (routine x 2)     Status: None (Preliminary result)   Collection Time: 03/05/17 11:55 AM  Result Value Ref Range Status   Specimen Description BLOOD RIGHT ANTECUBITAL  Final   Special Requests IN PEDIATRIC BOTTLE Blood Culture adequate volume  Final   Culture NO GROWTH < 24 HOURS  Final   Report Status PENDING  Incomplete  Urine culture     Status: None   Collection Time: 03/05/17 12:53 PM  Result Value Ref Range Status   Specimen Description URINE, CLEAN  CATCH  Final   Special Requests NONE  Final   Culture NO GROWTH  Final   Report Status 03/06/2017 FINAL  Final       Blood Culture    Component Value Date/Time   SDES URINE, CLEAN CATCH 03/05/2017 1253   SPECREQUEST NONE 03/05/2017 1253   CULT NO GROWTH 03/05/2017 1253   REPTSTATUS 03/06/2017 FINAL 03/05/2017 1253      Labs: Results for orders placed or performed during the hospital encounter of 03/05/17 (from the past 48 hour(s))  Glucose, capillary     Status: Abnormal   Collection Time: 03/05/17  2:47 PM  Result Value Ref Range   Glucose-Capillary >600 (HH) 65 - 99 mg/dL   Comment 1 Capillary Specimen   Basic metabolic panel     Status: Abnormal   Collection Time: 03/05/17  3:00 PM  Result Value Ref Range   Sodium 130 (L) 135 - 145 mmol/L    Comment: REPEATED TO VERIFY   Potassium 5.7 (H) 3.5 - 5.1 mmol/L    Comment: REPEATED TO VERIFY   Chloride 88 (L) 101 - 111 mmol/L    Comment: REPEATED TO VERIFY   CO2 16 (L) 22 - 32 mmol/L    Comment: REPEATED TO VERIFY   Glucose, Bld 824 (HH) 65 - 99 mg/dL    Comment: CRITICAL RESULT CALLED TO, READ BACK BY AND VERIFIED WITH: SHYSHKOARN [id number] [place]    BUN  83 (H) 6 - 20 mg/dL   Creatinine, Ser 15.13 (H) 0.61 - 1.24 mg/dL   Calcium 9.4 8.9 - 10.3 mg/dL   GFR calc non Af Amer 4 (L) >60 mL/min   GFR calc Af Amer 4 (L) >60 mL/min    Comment: (NOTE) The eGFR has been calculated using the CKD EPI equation. This calculation has not been validated in all clinical situations. eGFR's persistently <60 mL/min signify possible Chronic Kidney Disease.    Anion gap 26 (H) 5 - 15    Comment: REPEATED TO VERIFY  Glucose, capillary     Status: Abnormal   Collection Time: 03/05/17  3:56 PM  Result Value Ref Range   Glucose-Capillary >600 (HH) 65 - 99 mg/dL   Comment 1 Capillary Specimen   Glucose, capillary     Status: Abnormal   Collection Time: 03/05/17  5:01 PM  Result Value Ref Range   Glucose-Capillary >600 (HH) 65 - 99 mg/dL   Comment 1 Capillary Specimen   Basic metabolic panel     Status: Abnormal   Collection Time: 03/05/17  5:30 PM  Result Value Ref Range   Sodium 131 (L) 135 - 145 mmol/L   Potassium 5.1 3.5 - 5.1 mmol/L   Chloride 94 (L) 101 - 111 mmol/L   CO2 21 (L) 22 - 32 mmol/L   Glucose, Bld 687 (HH) 65 - 99 mg/dL    Comment: CRITICAL RESULT CALLED TO, READ BACK BY AND VERIFIED WITH: SHYSHKOARN [id number] [place]    BUN 82 (H) 6 - 20 mg/dL   Creatinine, Ser 15.00 (H) 0.61 - 1.24 mg/dL   Calcium 9.4 8.9 - 10.3 mg/dL   GFR calc non Af Amer 4 (L) >60 mL/min   GFR calc Af Amer 4 (L) >60 mL/min    Comment: (NOTE) The eGFR has been calculated using the CKD EPI equation. This calculation has not been validated in all clinical situations. eGFR's persistently <60 mL/min signify possible Chronic Kidney Disease.    Anion gap 16 (H) 5 - 15  Glucose, capillary     Status: Abnormal   Collection Time: 03/05/17  6:06 PM  Result Value Ref Range   Glucose-Capillary 592 (HH) 65 - 99 mg/dL   Comment 1 Venous Specimen    Comment 2 Notify RN   Glucose, capillary     Status: Abnormal   Collection Time: 03/05/17  7:30 PM  Result Value Ref  Range   Glucose-Capillary 503 (HH) 65 - 99 mg/dL   Comment 1 Notify RN   Glucose, capillary     Status: Abnormal   Collection Time: 03/05/17  8:36 PM  Result Value Ref Range   Glucose-Capillary 396 (H) 65 - 99 mg/dL   Comment 1 Notify RN   Basic metabolic panel     Status: Abnormal   Collection Time: 03/05/17  9:34 PM  Result Value Ref Range   Sodium 135 135 - 145 mmol/L   Potassium 4.9 3.5 - 5.1 mmol/L   Chloride 95 (L) 101 - 111 mmol/L   CO2 25 22 - 32 mmol/L   Glucose, Bld 353 (H) 65 - 99 mg/dL   BUN 82 (H) 6 - 20 mg/dL   Creatinine, Ser 15.05 (H) 0.61 - 1.24 mg/dL   Calcium 9.5 8.9 - 10.3 mg/dL   GFR calc non Af Amer 4 (L) >60 mL/min   GFR calc Af Amer 4 (L) >60 mL/min    Comment: (NOTE) The eGFR has been calculated using the CKD EPI equation. This calculation has not been validated in all clinical situations. eGFR's persistently <60 mL/min signify possible Chronic Kidney Disease.    Anion gap 15 5 - 15  Glucose, capillary     Status: Abnormal   Collection Time: 03/05/17  9:43 PM  Result Value Ref Range   Glucose-Capillary 340 (H) 65 - 99 mg/dL   Comment 1 Venous Specimen   Glucose, capillary     Status: Abnormal   Collection Time: 03/05/17 10:49 PM  Result Value Ref Range   Glucose-Capillary 211 (H) 65 - 99 mg/dL   Comment 1 Capillary Specimen    Comment 2 Notify RN   Glucose, capillary     Status: Abnormal   Collection Time: 03/05/17 11:46 PM  Result Value Ref Range   Glucose-Capillary 151 (H) 65 - 99 mg/dL   Comment 1 Notify RN   Glucose, capillary     Status: None   Collection Time: 03/06/17 12:58 AM  Result Value Ref Range   Glucose-Capillary 95 65 - 99 mg/dL   Comment 1 Capillary Specimen    Comment 2 Notify RN   Glucose, capillary     Status: None   Collection Time: 03/06/17  2:01 AM  Result Value Ref Range   Glucose-Capillary 68 65 - 99 mg/dL   Comment 1 Capillary Specimen    Comment 2 Notify RN   APTT     Status: None   Collection Time: 03/06/17   2:05 AM  Result Value Ref Range   aPTT 35 24 - 36 seconds  Comprehensive metabolic panel     Status: Abnormal   Collection Time: 03/06/17  2:05 AM  Result Value Ref Range   Sodium 136 135 - 145 mmol/L   Potassium 4.5 3.5 - 5.1 mmol/L   Chloride 96 (L) 101 - 111 mmol/L   CO2 26 22 - 32 mmol/L   Glucose, Bld 72 65 - 99 mg/dL   BUN 83 (H) 6 - 20 mg/dL   Creatinine, Ser 15.25 (H) 0.61 - 1.24 mg/dL  Calcium 9.1 8.9 - 10.3 mg/dL   Total Protein 6.4 (L) 6.5 - 8.1 g/dL   Albumin 3.1 (L) 3.5 - 5.0 g/dL   AST 78 (H) 15 - 41 U/L   ALT 122 (H) 17 - 63 U/L   Alkaline Phosphatase 75 38 - 126 U/L   Total Bilirubin 0.8 0.3 - 1.2 mg/dL   GFR calc non Af Amer 4 (L) >60 mL/min   GFR calc Af Amer 4 (L) >60 mL/min    Comment: (NOTE) The eGFR has been calculated using the CKD EPI equation. This calculation has not been validated in all clinical situations. eGFR's persistently <60 mL/min signify possible Chronic Kidney Disease.    Anion gap 14 5 - 15  Magnesium     Status: Abnormal   Collection Time: 03/06/17  2:05 AM  Result Value Ref Range   Magnesium 3.0 (H) 1.7 - 2.4 mg/dL  Protime-INR     Status: None   Collection Time: 03/06/17  2:05 AM  Result Value Ref Range   Prothrombin Time 14.4 11.4 - 15.2 seconds   INR 1.13   Glucose, capillary     Status: None   Collection Time: 03/06/17  2:28 AM  Result Value Ref Range   Glucose-Capillary 92 65 - 99 mg/dL   Comment 1 Capillary Specimen   CBC with Differential/Platelet     Status: Abnormal   Collection Time: 03/06/17  2:56 AM  Result Value Ref Range   WBC 11.6 (H) 4.0 - 10.5 K/uL   RBC 3.02 (L) 4.22 - 5.81 MIL/uL   Hemoglobin 8.7 (L) 13.0 - 17.0 g/dL   HCT 25.8 (L) 39.0 - 52.0 %   MCV 85.4 78.0 - 100.0 fL    Comment: RESULTS VERIFIED VIA RECOLLECT REPEATED TO VERIFY DELTA CHECK NOTED    MCH 28.8 26.0 - 34.0 pg   MCHC 33.7 30.0 - 36.0 g/dL   RDW 15.5 11.5 - 15.5 %   Platelets 214 150 - 400 K/uL   Neutrophils Relative % 78 %   Neutro  Abs 9.0 (H) 1.7 - 7.7 K/uL   Lymphocytes Relative 15 %   Lymphs Abs 1.8 0.7 - 4.0 K/uL   Monocytes Relative 7 %   Monocytes Absolute 0.8 0.1 - 1.0 K/uL   Eosinophils Relative 0 %   Eosinophils Absolute 0.0 0.0 - 0.7 K/uL   Basophils Relative 0 %   Basophils Absolute 0.0 0.0 - 0.1 K/uL  Glucose, capillary     Status: None   Collection Time: 03/06/17  3:31 AM  Result Value Ref Range   Glucose-Capillary 82 65 - 99 mg/dL   Comment 1 Notify RN   Glucose, capillary     Status: None   Collection Time: 03/06/17  4:42 AM  Result Value Ref Range   Glucose-Capillary 89 65 - 99 mg/dL   Comment 1 Capillary Specimen   Glucose, capillary     Status: Abnormal   Collection Time: 03/06/17  5:50 AM  Result Value Ref Range   Glucose-Capillary 123 (H) 65 - 99 mg/dL   Comment 1 Capillary Specimen   Basic metabolic panel     Status: Abnormal   Collection Time: 03/06/17  6:00 AM  Result Value Ref Range   Sodium 135 135 - 145 mmol/L   Potassium 5.9 (H) 3.5 - 5.1 mmol/L   Chloride 95 (L) 101 - 111 mmol/L   CO2 24 22 - 32 mmol/L   Glucose, Bld 120 (H) 65 - 99 mg/dL  BUN 82 (H) 6 - 20 mg/dL   Creatinine, Ser 15.44 (H) 0.61 - 1.24 mg/dL   Calcium 9.0 8.9 - 10.3 mg/dL   GFR calc non Af Amer 4 (L) >60 mL/min   GFR calc Af Amer 4 (L) >60 mL/min    Comment: (NOTE) The eGFR has been calculated using the CKD EPI equation. This calculation has not been validated in all clinical situations. eGFR's persistently <60 mL/min signify possible Chronic Kidney Disease.    Anion gap 16 (H) 5 - 15  Glucose, capillary     Status: Abnormal   Collection Time: 03/06/17  6:56 AM  Result Value Ref Range   Glucose-Capillary 130 (H) 65 - 99 mg/dL   Comment 1 Capillary Specimen    Comment 2 Notify RN   Glucose, capillary     Status: Abnormal   Collection Time: 03/06/17  7:44 AM  Result Value Ref Range   Glucose-Capillary 134 (H) 65 - 99 mg/dL   Comment 1 Capillary Specimen    Comment 2 Notify RN   Glucose,  capillary     Status: Abnormal   Collection Time: 03/06/17  9:03 AM  Result Value Ref Range   Glucose-Capillary 158 (H) 65 - 99 mg/dL  Glucose, capillary     Status: None   Collection Time: 03/06/17  1:32 PM  Result Value Ref Range   Glucose-Capillary 76 65 - 99 mg/dL   Comment 1 Capillary Specimen   Basic metabolic panel     Status: Abnormal   Collection Time: 03/06/17  4:14 PM  Result Value Ref Range   Sodium 136 135 - 145 mmol/L   Potassium 4.9 3.5 - 5.1 mmol/L   Chloride 95 (L) 101 - 111 mmol/L   CO2 27 22 - 32 mmol/L   Glucose, Bld 187 (H) 65 - 99 mg/dL   BUN 39 (H) 6 - 20 mg/dL   Creatinine, Ser 10.08 (H) 0.61 - 1.24 mg/dL    Comment: DIALYSIS   Calcium 8.9 8.9 - 10.3 mg/dL   GFR calc non Af Amer 6 (L) >60 mL/min   GFR calc Af Amer 7 (L) >60 mL/min    Comment: (NOTE) The eGFR has been calculated using the CKD EPI equation. This calculation has not been validated in all clinical situations. eGFR's persistently <60 mL/min signify possible Chronic Kidney Disease.    Anion gap 14 5 - 15  Glucose, capillary     Status: Abnormal   Collection Time: 03/06/17  5:50 PM  Result Value Ref Range   Glucose-Capillary 219 (H) 65 - 99 mg/dL   Comment 1 Capillary Specimen   Glucose, capillary     Status: Abnormal   Collection Time: 03/06/17 10:08 PM  Result Value Ref Range   Glucose-Capillary 176 (H) 65 - 99 mg/dL   Comment 1 Capillary Specimen    Comment 2 Notify RN   Glucose, capillary     Status: Abnormal   Collection Time: 03/07/17  7:21 AM  Result Value Ref Range   Glucose-Capillary 342 (H) 65 - 99 mg/dL   Comment 1 Capillary Specimen    Comment 2 Notify RN   Glucose, capillary     Status: Abnormal   Collection Time: 03/07/17 11:19 AM  Result Value Ref Range   Glucose-Capillary 110 (H) 65 - 99 mg/dL   Comment 1 Capillary Specimen    Comment 2 Notify RN      Lipid Panel  No results found for: CHOL, TRIG, HDL, CHOLHDL, VLDL, LDLCALC, LDLDIRECT  Lab Results   Component Value Date   HGBA1C 9.7 12/14/2016   HGBA1C 9.7 04/11/2016   HGBA1C 9.1 01/08/2016     Lab Results  Component Value Date   CREATININE 10.08 (H) 03/06/2017     HPI    37 year old African-American male past medical history of type 1 diabetes brittle diabetes hypertension end-stage renal disease on hemodialysis history of suicidal attempt no history of seizures who presents after he was feeling weak short of breath called EMS and the EMS patient went into V. Tach. Patient missed dialysis yesterday and he was feeling weak EMS picked him up he became unresponsive had V. tach with pulse on  cardioversion,  patient received calcium chloride sodium bicarb and a 2 x 2 he was noticed to be responsive but did try to intubate him as in ED patient was more  interactive awake, full duration of  code was around 20 minutes     HOSPITAL COURSE:    Cardiac arrest secondary to hyperkalemia and noncompliance with hemodialysis 2-D echo completed, results  as above. Discussed with Dr. Meda Coffee. Cardiac MRI with and without contrast recommended by cardiology Patient extremely resistant to workup and always requesting to leave he is willing to risk sudden cardiac death, which was explained to him in presence of RN. Cardiac MRI scheduled but patient declined due to claustrophobia He is also declining cardiology consult prior to discharge, and ambulatory cardiology referral has been provided to him, not sure if he will be compliant with this I have also requested Dr.Patwardhan , on call to see the patient   ESRD nephrology following, regular hemodialysis Tuesday Thursday Saturday  Hypertension-stable continue home medications    Hyperkalemia-potassium has improved, currently 4.9  Hyperglycemia/DKA-patient is followed by Dr. Renato Shin, continue Lantus and sliding scale insulin.. Dose of Lantus decreased to 18 units due to hypoglycemia yesterday and this morning. DKA resolved . He has been  recommended to follow-up with his endocrinologist as soon as possible after discharge, and to preform his February appointment.  Depression continue Prozac  Anemia of chronic disease  Positive blood cultures Staph coag negative, deemed to be a contaminant and not treated at this point    Discharge Exam:   Blood pressure (!) 153/87, pulse 92, temperature 98.3 F (36.8 C), temperature source Oral, resp. rate 13, height _0  (1.803 m), weight 93.9 kg (207 lb 0.2 oz), SpO2 100 %.   General exam: Appears calm and comfortable  Respiratory system: Clear to auscultation. Respiratory effort normal. Cardiovascular system: S1 & S2 heard, RRR. No JVD, murmurs, rubs, gallops or clicks. No pedal edema. Gastrointestinal system: Abdomen is nondistended, soft and nontender. No organomegaly or masses felt. Normal bowel sounds heard. Central nervous system: Alert and oriented. No focal neurological deficits. Extremities: Symmetric 5 x 5 power. Skin: No rashes, lesions or ulcers Psychiatry: Judgement and insight appear normal. Mood & affect appropriate     Follow-up Information    Nolene Ebbs, MD. Call.   Specialty:  Internal Medicine Why:  in 3-5 days , hospital follow up Contact information: [place] [id number] [phone]           Signed: Reyne Dumas 03/07/2017, 2:31 PM        Time spent >1 hour

## 2017-03-07 NOTE — Progress Notes (Signed)
Patient ID: Edward Colon, male   DOB: Aug 04, 1980, 37 y.o.   MRN: [id number]  [facility] Progress Note    Subjective:   Feels "great and I need to go home"   Objective:   BP (!) 153/93   Pulse 92   Temp 98.1 F (36.7 C) (Oral)   Resp 13   Ht 5\' 11"  (1.803 m)   Wt 93.9 kg (207 lb 0.2 oz)   SpO2 98%   BMI 28.87 kg/m   Intake/Output: I/O last 3 completed shifts: In: 1223.1 [I.V.:1173.1; IV Piggyback:50] Out: 4970 [Urine:300; Other:2000]   Intake/Output this shift:  Total I/O In: 400 [P.O.:400] Out: -  Weight change: 3.313 kg (7 lb 4.9 oz)  Physical Exam: Gen: NAD  CVS: no rub Resp: cta Abd: benign Ext: no edema, LUE AVG +T/B  Labs: BMET Recent Labs  Lab 03/05/17 0749  03/05/17 0810 03/05/17 1230 03/05/17 1500 03/05/17 1730 03/05/17 2134 03/06/17 0205 03/06/17 0600 03/06/17 1614  NA 129*   < >  --  130* 130* 131* 135 136 135 136  K 6.7*   < >  --  6.9* 5.7* 5.1 4.9 4.5 5.9* 4.9  CL 83*   < >  --  87* 88* 94* 95* 96* 95* 95*  CO2 11*  --   --  11* 16* 21* 25 26 24 27   GLUCOSE 893*   < >  --  918* 824* 687* 353* 72 120* 187*  BUN 78*   < >  --  84* 83* 82* 82* 83* 82* 39*  CREATININE 15.48*   < >  --  15.18* 15.13* 15.00* 15.05* 15.25* 15.44* 10.08*  ALBUMIN 3.8  --   --  3.4*  --   --   --  3.1*  --   --   CALCIUM 9.8  --   --  9.7 9.4 9.4 9.5 9.1 9.0 8.9  PHOS  --   --  9.2* 9.0*  --   --   --   --   --   --    < > = values in this interval not displayed.   CBC Recent Labs  Lab 03/05/17 0749 03/05/17 0752 03/05/17 0950 03/06/17 0256  WBC 7.6  --  16.1* 11.6*  NEUTROABS 5.7  --   --  9.0*  HGB 10.9* 12.2* 10.3* 8.7*  HCT 35.5* 36.0* 34.3* 25.8*  MCV 93.4  --  94.8 85.4  PLT 307  --  287 214    @IMGRELPRIORS @ Medications:    . amLODipine  10 mg Oral Daily  . calcium acetate  667 mg Oral TID WC  . insulin aspart  0-15 Units Subcutaneous TID WC  . insulin aspart  0-5 Units Subcutaneous QHS  . insulin glargine  20 Units  Subcutaneous Daily   Dialysis Orders: [facility] T,Th,S 4 hr 15 min 180 NRe 500/800 90 kg 2.0 K/2.5 Ca  -Heparin 6800 units IV TIW -Hectorol 5 mcg IV TIW -Mircera 75 mcg IV q 2 weeks (last dose 02/21/2017 HGB 10.9 03/02/17)   Assessment/ Plan:   1. Cardiac arrest- resolved 2. DKA- improved with insulin 3. ESRD- continue with HD qTTS, next appointment is as an outpatient on Thursday as scheduled 4. Anemia: continue with ESA 5. CKD-MBD:contin with binders VDRA 6. Nutrition: renal diet, carbohydrate modified 7. Hypertension: stable 8. Disposition- per primary svc.  Donetta Potts, MD [place] Pager [phone] 03/07/2017, 10:31 AM

## 2017-03-08 ENCOUNTER — Inpatient Hospital Stay (HOSPITAL_COMMUNITY): Payer: Medicare Other

## 2017-03-08 DIAGNOSIS — J9601 Acute respiratory failure with hypoxia: Secondary | ICD-10-CM

## 2017-03-08 LAB — GLUCOSE, CAPILLARY
GLUCOSE-CAPILLARY: 200 mg/dL — AB (ref 65–99)
Glucose-Capillary: 144 mg/dL — ABNORMAL HIGH (ref 65–99)
Glucose-Capillary: 76 mg/dL (ref 65–99)
Glucose-Capillary: 394 mg/dL — ABNORMAL HIGH (ref 65–99)
Glucose-Capillary: 34 mg/dL — CL (ref 65–99)
Glucose-Capillary: 67 mg/dL (ref 65–99)

## 2017-03-08 LAB — DRUG SCREEN 10 W/CONF, SERUM
AMPHETAMINES, IA: NEGATIVE ng/mL
BARBITURATES, IA: NEGATIVE ug/mL
Benzodiazepines, IA: NEGATIVE ng/mL
COCAINE & METABOLITE, IA: NEGATIVE ng/mL
METHADONE, IA: NEGATIVE ng/mL
OXYCODONES, IA: NEGATIVE ng/mL
Opiates, IA: NEGATIVE ng/mL
PROPOXYPHENE, IA: NEGATIVE ng/mL
Phencyclidine, IA: NEGATIVE ng/mL
THC(Marijuana) Metabolite, IA: NEGATIVE ng/mL

## 2017-03-08 LAB — CBC
HCT: 31.8 % — ABNORMAL LOW (ref 39.0–52.0)
Hemoglobin: 10.7 g/dL — ABNORMAL LOW (ref 13.0–17.0)
MCH: 29.2 pg (ref 26.0–34.0)
MCHC: 33.6 g/dL (ref 30.0–36.0)
MCV: 86.6 fL (ref 78.0–100.0)
Platelets: 214 K/uL (ref 150–400)
RBC: 3.67 MIL/uL — ABNORMAL LOW (ref 4.22–5.81)
RDW: 15.3 % (ref 11.5–15.5)
WBC: 5.3 10*3/uL (ref 4.0–10.5)

## 2017-03-08 LAB — RENAL FUNCTION PANEL
ANION GAP: 12 (ref 5–15)
Albumin: 3.5 g/dL (ref 3.5–5.0)
BUN: 58 mg/dL — ABNORMAL HIGH (ref 6–20)
CALCIUM: 8.5 mg/dL — AB (ref 8.9–10.3)
CHLORIDE: 98 mmol/L — AB (ref 101–111)
CO2: 26 mmol/L (ref 22–32)
Creatinine, Ser: 13.92 mg/dL — ABNORMAL HIGH (ref 0.61–1.24)
GFR calc non Af Amer: 4 mL/min — ABNORMAL LOW (ref >60)
GFR, EST AFRICAN AMERICAN: 5 mL/min — AB (ref >60)
Glucose, Bld: 184 mg/dL — ABNORMAL HIGH (ref 65–99)
POTASSIUM: 5.3 mmol/L — AB (ref 3.5–5.1)
Phosphorus: 5.3 mg/dL — ABNORMAL HIGH (ref 2.5–4.6)
Sodium: 136 mmol/L (ref 135–145)

## 2017-03-08 LAB — CULTURE, BLOOD (ROUTINE X 2): SPECIAL REQUESTS: ADEQUATE

## 2017-03-08 MED ORDER — HEPARIN SODIUM (PORCINE) 1000 UNIT/ML DIALYSIS
1000.0000 [IU] | INTRAMUSCULAR | Status: DC | PRN
  Filled 2017-03-08: qty 1

## 2017-03-08 MED ORDER — LIDOCAINE HCL (PF) 1 % IJ SOLN
5.0000 mL | INTRAMUSCULAR | Status: DC | PRN
Start: 2017-03-08 — End: 2017-03-08
  Filled 2017-03-08: qty 5

## 2017-03-08 MED ORDER — ALTEPLASE 2 MG IJ SOLR: 2.0000 mg | Freq: Once | INTRAMUSCULAR | Status: DC | PRN

## 2017-03-08 MED ORDER — ALTEPLASE 2 MG IJ SOLR
2.0000 mg | Freq: Once | INTRAMUSCULAR | Status: DC | PRN
  Filled 2017-03-08: qty 2

## 2017-03-08 MED ORDER — LIDOCAINE HCL (PF) 1 % IJ SOLN: 5.0000 mL | INTRAMUSCULAR | Status: DC | PRN

## 2017-03-08 MED ORDER — PENTAFLUOROPROP-TETRAFLUOROETH EX AERO: 1.0000 "application " | INHALATION_SPRAY | CUTANEOUS | Status: DC | PRN

## 2017-03-08 MED ORDER — PENTAFLUOROPROP-TETRAFLUOROETH EX AERO
1.0000 | INHALATION_SPRAY | CUTANEOUS | Status: DC | PRN
Start: 2017-03-08 — End: 2017-03-08

## 2017-03-08 MED ORDER — CARVEDILOL 6.25 MG PO TABS: 6.2500 mg | ORAL_TABLET | Freq: Two times a day (BID) | ORAL | Status: DC

## 2017-03-08 MED ORDER — LIDOCAINE-PRILOCAINE 2.5-2.5 % EX CREA: 1.0000 "application " | TOPICAL_CREAM | CUTANEOUS | Status: DC | PRN

## 2017-03-08 MED ORDER — HEPARIN SODIUM (PORCINE) 1000 UNIT/ML DIALYSIS
20.0000 [IU]/kg | INTRAMUSCULAR | Status: DC | PRN
  Filled 2017-03-08: qty 2

## 2017-03-08 MED ORDER — LIDOCAINE-PRILOCAINE 2.5-2.5 % EX CREA
1.0000 "application " | TOPICAL_CREAM | CUTANEOUS | Status: DC | PRN
  Filled 2017-03-08: qty 5

## 2017-03-08 MED ORDER — SODIUM CHLORIDE 0.9 % IV SOLN: 100.0000 mL | INTRAVENOUS | Status: DC | PRN

## 2017-03-08 MED ORDER — HEPARIN SODIUM (PORCINE) 1000 UNIT/ML DIALYSIS
3000.0000 [IU] | INTRAMUSCULAR | Status: DC | PRN
  Filled 2017-03-08: qty 3

## 2017-03-08 MED ORDER — SODIUM CHLORIDE 0.9 % IV SOLN
100.0000 mL | INTRAVENOUS | Status: DC | PRN
Start: 2017-03-08 — End: 2017-03-08

## 2017-03-08 MED ORDER — INSULIN GLARGINE 100 UNIT/ML ~~LOC~~ SOLN
10.0000 [IU] | Freq: Every day | SUBCUTANEOUS | Status: DC
  Administered 2017-03-08: 10 [IU] via SUBCUTANEOUS
  Filled 2017-03-08: qty 0.1

## 2017-03-08 MED ORDER — METOPROLOL TARTRATE 25 MG PO TABS: 25.0000 mg | ORAL_TABLET | Freq: Every morning | ORAL | 11 refills | Status: DC

## 2017-03-08 NOTE — Care Management Note (Signed)
Case Management Note  Patient Details  Name: Edward Colon MRN: [id number] Date of Birth: [birth year]  Subjective/Objective:      Pt admitted with DKA. He is from home with his spouse.         Action/Plan: Pt discharging home with self care. Pt has PCP, insurance and transportation home. No further needs per CM.   Expected Discharge Date:  03/08/17               Expected Discharge Plan:  Home/Self Care  In-House Referral:     Discharge planning Services     Post Acute Care Choice:    Choice offered to:     DME Arranged:    DME Agency:     HH Arranged:    HH Agency:     Status of Service:  Completed, signed off  If discussed at [employer] of Stay Meetings, dates discussed:    Additional Comments:  Pollie Friar, RN 03/08/2017, 2:11 PM

## 2017-03-08 NOTE — Progress Notes (Signed)
[facility] Progress Note   Subjective:  Seen in room, feels well today. Tells me plan is for d/c today.   Objective Vitals:   03/07/17 1946 03/07/17 2031 03/08/17 0604 03/08/17 0940  BP: (!) 157/93 (!) 168/87 (!) 169/93 (!) 173/93  Pulse: 88 89 81 89  Resp: 18 12 16    Temp:  98.6 F (37 C) 98.4 F (36.9 C)   TempSrc:  Oral Oral   SpO2:  99% 100%   Weight:  95.2 kg (209 lb 12.8 oz)    Height:       Physical Exam General: Well appearing, NAD. Heart: RRR; no murmur Lungs: CTAB Extremities: No LE edema Dialysis Access: L AVF + thrill  Additional Objective Labs: Basic Metabolic Panel: Recent Labs  Lab 03/05/17 0810 03/05/17 1230  03/06/17 0205 03/06/17 0600 03/06/17 1614  NA  --  130*   < > 136 135 136  K  --  6.9*   < > 4.5 5.9* 4.9  CL  --  87*   < > 96* 95* 95*  CO2  --  11*   < > 26 24 27   GLUCOSE  --  918*   < > 72 120* 187*  BUN  --  84*   < > 83* 82* 39*  CREATININE  --  15.18*   < > 15.25* 15.44* 10.08*  CALCIUM  --  9.7   < > 9.1 9.0 8.9  PHOS 9.2* 9.0*  --   --   --   --    < > = values in this interval not displayed.   Liver Function Tests: Recent Labs  Lab 03/05/17 0749 03/05/17 1230 03/06/17 0205  AST 229*  --  78*  ALT 207*  --  122*  ALKPHOS 102  --  75  BILITOT 1.7*  --  0.8  PROT 7.5  --  6.4*  ALBUMIN 3.8 3.4* 3.1*   CBC: Recent Labs  Lab 03/05/17 0749 03/05/17 0752 03/05/17 0950 03/06/17 0256  WBC 7.6  --  16.1* 11.6*  NEUTROABS 5.7  --   --  9.0*  HGB 10.9* 12.2* 10.3* 8.7*  HCT 35.5* 36.0* 34.3* 25.8*  MCV 93.4  --  94.8 85.4  PLT 307  --  287 214   Medications: . sodium chloride    . sodium chloride    . sodium chloride    . sodium chloride    . albuterol 10 mg/hr (03/05/17 0929)   . amLODipine  10 mg Oral Daily  . calcium acetate  667 mg Oral TID WC  . insulin aspart  0-9 Units Subcutaneous Q4H  . insulin glargine  10 Units Subcutaneous Daily    Dialysis Orders: Dialysis Orders:[facility] T,Th,S 4  hr 15 min 180 NRe 500/800 90 kg 2.0 K/2.5 Ca  -Heparin 6800 units IV TIW -Hectorol 5 mcg IV TIW -Mircera 75 mcg IV q 2 weeks (last dose 02/21/2017 HGB 10.9 03/02/17)  A/P:  1. Cardiac arrest: Resolved. 2. DKA- improved with insulin 3. ESRD: Continue HD per TTS schedule, next HD 03/09/17. 4. Anemia: continue with ESA 5. CKD-MBD:contin with binders VDRA 6. Nutrition: renal diet, carbohydrate modified 7. Hypertension: stable 8. Disposition- per primary svc.  Veneta Penton, PA-C 03/08/2017, 10:30 AM  [facility] Pager: [phone]  I have seen and examined this patient and agree with plan and assessment in the above note with renal recommendations/intervention highlighted.  Pt declined cardiac MR.  Will have extra HD treatment  today since he is 6kg above his edw.  He is due or HD again tomorrow and will plan to f/u with his outpatient dialysis.  He will need outpatient cardiac MR with meds to r/o non-compaction CMP.  Broadus John A Patt Steinhardt,MD 03/08/2017 3:22 PM

## 2017-03-08 NOTE — Progress Notes (Signed)
Hypoglycemic Event  CBG: 67  Treatment: 15 GM carbohydrate snack  Symptoms: None  Follow-up CBG: Time: CBG Result:200  Possible Reasons for Event: Unknown  Comments/MD notified: Np Schorr aware of initial value    Gildardo Griffes

## 2017-03-08 NOTE — Progress Notes (Signed)
Patient came back from dialysis with a blood sugar of 34. He states that he will eat his entire tray and his sugar will come up. NP Schorr has been notified about current sugar. Patient states he will be fine and still wants to leave.

## 2017-03-08 NOTE — Progress Notes (Signed)
Attempted cardiac MRI.  Pt very claustrophobic--scan aborted and pt did not wish to try with sedation at this time.  Please call MRI if he changes his mind and we will try again.

## 2017-03-08 NOTE — Progress Notes (Signed)
Seen by me. Cardiac arrest was likely related to metabolic acidosis and hyperkalemia. EF 50-55%, possible LV noncompaction. Unfortunately, cannot perform MRI with gadolinium contrast given ESRD. Recommend starting on coreg 6.25 bid. Ok to discahrge from [employer] standpoint. Will see him in the office for outpatient f/u. Will arrange appt.   Full consult note to follow.  Nigel Mormon, MD [facility]. [place] Pager: [phone] Office: [phone] If no answer Cell [phone]

## 2017-03-08 NOTE — Consult Note (Addendum)
Reason for Consult:Cardiomyopathy Referring Physician: Cardiac arrest  Edward Colon is an 37 y.o. male.  HPI:   37 year old African-American male with type 1 diabetes mellitus with complications, hypertension, end-stage renal disease on hemodialysis. 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 On 03/05/2017, he had missed one session of hemodialysis due to misunderstanding regarding in holiday schedule. He had been feeling generalized weakness which was followed by an episode of cardiac arrest with ventricular tachycardia. He was resuscitated with defibrillation, with possible downtime of 20 minutes. Heart admission, he was found to be in diabetic ketoacidosis and hyperkalemia. Echocardiogram showed EF of 50-55% with possibility of not complexion LV, however MRI could not be performed due to his end-stage renal disease. Patient has been awake and alert and back to baseline since the resuscitation. He had negative troponin and did not have any evidence suggesting acute coronary syndrome as the etiology of ventricular tachycardia. When I saw him, patient is comfortable in dialysis and denies any chest pain, shortness of breath. Prior to this, he denies any exertional symptoms. He has occasional leg edema. Of note, he underwent stress test in 2015 and reached 15 minutes activity without any difficulty.  Past Medical History:  Diagnosis Date  . Anemia   . Blind left eye since ~ 2010  . Depression   . Diabetic peripheral neuropathy ([place])   . ESRD (end stage renal disease) on dialysis [facility])    "TTS; [place] [facility]; [facility]" (02/01/2016)  . Heart murmur    denies any problems with it  . Hypertension   . Seizures ([place])    "last one was end of 2016; they are related to my diabetes" (02/01/2016)  . Type 1 diabetes ([place]) dx'd 1990    Past Surgical History:  Procedure Laterality Date  . AV FISTULA PLACEMENT Right 03/03/2015   Procedure: RIGHT RADIO-CEPHALIC ARTERIOVENOUS (AV)  FISTULA CREATION;  Surgeon: Serafina Mitchell, MD;  Location: [facility] OR;  Service: Vascular;  Laterality: Right;  . AV FISTULA PLACEMENT Left 06/15/2015   Procedure: INSERTION OF LEFT UPPER ARM  ARTERIOVENOUS (AV) 70m x 50cm GORE-TEX GRAFT;  Surgeon: CElam Dutch MD;  [place] [facility]  Service: Vascular;  Laterality: Left;  . BASCILIC VEIN TRANSPOSITION Left 04/01/2015   Procedure: BASCILIC VEIN TRANSPOSITION-LEFT ARM- FIRST STAGE;  Surgeon: CElam Dutch MD;  [place]  Service: Vascular;  Laterality: Left;  . EYE SURGERY Right ~ 2010   for diabetic retinopathy  . INSERTION OF DIALYSIS CATHETER Right 03/03/2015   Procedure: INSERTION OF DIALYSIS CATHETER;  Surgeon: VSerafina Mitchell MD;  [facility];  Service: Vascular;  Laterality: Right;    Family History  Problem Relation Age of Onset  . Diabetes Neg Hx     Social History:  reports that  has never smoked. he has never used smokeless tobacco. He reports that he drinks alcohol. He reports that he does not use drugs.  Allergies: No Known Allergies  Medications: I have reviewed the patient's current medications.  Results for orders placed or performed during the hospital encounter of 03/05/17 (from the past 48 hour(s))  Glucose, capillary     Status: Abnormal   Collection Time: 03/06/17 10:08 PM  Result Value Ref Range   Glucose-Capillary 176 (H) 65 - 99 mg/dL   Comment 1 Capillary Specimen    Comment 2 Notify RN   Glucose, capillary     Status: Abnormal   Collection Time: 03/07/17  7:21 AM  Result Value  Ref Range   Glucose-Capillary 342 (H) 65 - 99 mg/dL   Comment 1 Capillary Specimen    Comment 2 Notify RN   Glucose, capillary     Status: Abnormal   Collection Time: 03/07/17 11:19 AM  Result Value Ref Range   Glucose-Capillary 110 (H) 65 - 99 mg/dL   Comment 1 Capillary Specimen    Comment 2 Notify RN   Glucose, capillary     Status: Abnormal   Collection Time: 03/07/17  3:58 PM  Result Value Ref Range    Glucose-Capillary <10 (LL) 65 - 99 mg/dL  Glucose, capillary     Status: None   Collection Time: 03/07/17  4:13 PM  Result Value Ref Range   Glucose-Capillary 82 65 - 99 mg/dL  Glucose, capillary     Status: Abnormal   Collection Time: 03/07/17  5:32 PM  Result Value Ref Range   Glucose-Capillary 44 (LL) 65 - 99 mg/dL  Glucose, capillary     Status: Abnormal   Collection Time: 03/07/17  6:01 PM  Result Value Ref Range   Glucose-Capillary 561 (HH) 65 - 99 mg/dL   Comment 1 Capillary Specimen    Comment 2 Notify RN   Glucose, capillary     Status: Abnormal   Collection Time: 03/07/17  6:19 PM  Result Value Ref Range   Glucose-Capillary 167 (H) 65 - 99 mg/dL   Comment 1 Notify RN   Glucose, capillary     Status: Abnormal   Collection Time: 03/07/17  6:54 PM  Result Value Ref Range   Glucose-Capillary 184 (H) 65 - 99 mg/dL   Comment 1 Notify RN   Glucose, capillary     Status: Abnormal   Collection Time: 03/07/17  7:28 PM  Result Value Ref Range   Glucose-Capillary 158 (H) 65 - 99 mg/dL   Comment 1 Capillary Specimen    Comment 2 Notify RN   Glucose, capillary     Status: None   Collection Time: 03/07/17 11:55 PM  Result Value Ref Range   Glucose-Capillary 82 65 - 99 mg/dL  Glucose, capillary     Status: Abnormal   Collection Time: 03/08/17  4:00 AM  Result Value Ref Range   Glucose-Capillary 144 (H) 65 - 99 mg/dL  Glucose, capillary     Status: None   Collection Time: 03/08/17  8:03 AM  Result Value Ref Range   Glucose-Capillary 76 65 - 99 mg/dL  CBC     Status: Abnormal   Collection Time: 03/08/17  9:06 AM  Result Value Ref Range   WBC 5.3 4.0 - 10.5 K/uL    Comment: WHITE COUNT CONFIRMED ON SMEAR   RBC 3.67 (L) 4.22 - 5.81 MIL/uL   Hemoglobin 10.7 (L) 13.0 - 17.0 g/dL   HCT 31.8 (L) 39.0 - 52.0 %   MCV 86.6 78.0 - 100.0 fL   MCH 29.2 26.0 - 34.0 pg   MCHC 33.6 30.0 - 36.0 g/dL   RDW 15.3 11.5 - 15.5 %   Platelets 214 150 - 400 K/uL  Glucose, capillary      Status: Abnormal   Collection Time: 03/08/17 12:24 PM  Result Value Ref Range   Glucose-Capillary 394 (H) 65 - 99 mg/dL  Renal function panel     Status: Abnormal   Collection Time: 03/08/17  3:05 PM  Result Value Ref Range   Sodium 136 135 - 145 mmol/L   Potassium 5.3 (H) 3.5 - 5.1 mmol/L   Chloride 98 (L) 101 -  111 mmol/L   CO2 26 22 - 32 mmol/L   Glucose, Bld 184 (H) 65 - 99 mg/dL   BUN 58 (H) 6 - 20 mg/dL   Creatinine, Ser 13.92 (H) 0.61 - 1.24 mg/dL   Calcium 8.5 (L) 8.9 - 10.3 mg/dL   Phosphorus 5.3 (H) 2.5 - 4.6 mg/dL   Albumin 3.5 3.5 - 5.0 g/dL   GFR calc non Af Amer 4 (L) >60 mL/min   GFR calc Af Amer 5 (L) >60 mL/min    Comment: (NOTE) The eGFR has been calculated using the CKD EPI equation. This calculation has not been validated in all clinical situations. eGFR's persistently <60 mL/min signify possible Chronic Kidney Disease.    Anion gap 12 5 - 15    No results found.  Review of Systems  Constitutional: Positive for malaise/fatigue (Prior to admission).  HENT: Negative for hearing loss.   Eyes: Negative.   Respiratory: Negative for shortness of breath.   Cardiovascular: Positive for leg swelling. Negative for chest pain and PND.  Gastrointestinal: Negative for heartburn.  Genitourinary: Negative.   Skin: Negative for rash.  Neurological: Positive for loss of consciousness (Cardiac arrest on admission, now back to baseline.).       Cardiac arrest on admission, now back to baseline.  Endo/Heme/Allergies: Does not bruise/bleed easily.   Blood pressure (!) 167/91, pulse 94, temperature 97.9 F (36.6 C), temperature source Oral, resp. rate 18, height _0  (1.803 m), weight 89.9 kg (198 lb 3.1 oz), SpO2 99 %. Physical Exam  Nursing note and vitals reviewed. Constitutional: He is oriented to person, place, and time. He appears well-developed and well-nourished.  HENT:  Head: Normocephalic and atraumatic.  Eyes: Conjunctivae are normal. Pupils are equal,  round, and reactive to light.  Neck: No JVD present.  Cardiovascular: Normal rate, regular rhythm and normal heart sounds.  No murmur heard. Respiratory: Effort normal and breath sounds normal. He has no wheezes. He has no rales.  GI: Soft. Bowel sounds are normal. He exhibits no distension. There is no tenderness.  Musculoskeletal: Normal range of motion. He exhibits no edema.  Neurological: He is alert and oriented to person, place, and time.  Skin: Skin is warm and dry.  Psychiatric: He has a normal mood and affect.   Assessment: Status post cardiac arrest. Back to baseline mental status. Etiology likely metabolic acidosis and hyperkalemia in the setting of missed dialysis. No evidence of acute coronary syndrome. Hypertension Type 1 diabetes mellitus with complications Heart failure with preserved ejection fraction End-stage renal disease on hemodialysis   Recommendations: Continue amlodipine. Add carvedilol 6.25 mg twice daily. 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 Management of diabetes and end-stage renal disease per primary team.   Reynold Bowen Alixandra Alfieri 03/08/2017, 8:01 PM   Marciel Offenberger Esther Hardy, MD [facility] [place] Pager: [phone] Office: [phone] If no answer Cell [phone]

## 2017-03-08 NOTE — Progress Notes (Signed)
Pt discharged to home. Pt. Is alert and oriented. Pt is hemodynamically stable. AVS reviewed with pt. Capable of re verbalizing medication regimen. EJ removed- patient held pressure for about 20 mins. Discharge plan appropriate and in place.

## 2017-03-08 NOTE — Pre-Procedure Instructions (Signed)
Came to the patient at this time. Patient not in the room. Will attempt to see him later.   Vernell Leep, MD Cardiology

## 2017-03-08 NOTE — Progress Notes (Signed)
Hypoglycemic Event  CBG: 34  Treatment: 15 GM carbohydrate snack  Symptoms: None  Follow-up CBG: Time:2052 CBG Result:67  Possible Reasons for Event: Unknown  Comments/MD notified:NP Schorr    Gildardo Griffes

## 2017-03-09 DIAGNOSIS — N186 End stage renal disease: Secondary | ICD-10-CM | POA: Diagnosis not present

## 2017-03-09 DIAGNOSIS — N2581 Secondary hyperparathyroidism of renal origin: Secondary | ICD-10-CM | POA: Diagnosis not present

## 2017-03-09 DIAGNOSIS — E162 Hypoglycemia, unspecified: Secondary | ICD-10-CM | POA: Diagnosis not present

## 2017-03-09 DIAGNOSIS — D631 Anemia in chronic kidney disease: Secondary | ICD-10-CM | POA: Diagnosis not present

## 2017-03-09 DIAGNOSIS — E8779 Other fluid overload: Secondary | ICD-10-CM | POA: Diagnosis not present

## 2017-03-10 LAB — CULTURE, BLOOD (ROUTINE X 2)
CULTURE: NO GROWTH
Special Requests: ADEQUATE

## 2017-03-11 DIAGNOSIS — E8779 Other fluid overload: Secondary | ICD-10-CM | POA: Diagnosis not present

## 2017-03-11 DIAGNOSIS — N2581 Secondary hyperparathyroidism of renal origin: Secondary | ICD-10-CM | POA: Diagnosis not present

## 2017-03-11 DIAGNOSIS — D631 Anemia in chronic kidney disease: Secondary | ICD-10-CM | POA: Diagnosis not present

## 2017-03-11 DIAGNOSIS — E162 Hypoglycemia, unspecified: Secondary | ICD-10-CM | POA: Diagnosis not present

## 2017-03-11 DIAGNOSIS — N186 End stage renal disease: Secondary | ICD-10-CM | POA: Diagnosis not present

## 2017-03-12 ENCOUNTER — Other Ambulatory Visit: Payer: Self-pay | Admitting: Endocrinology

## 2017-03-14 DIAGNOSIS — D631 Anemia in chronic kidney disease: Secondary | ICD-10-CM | POA: Diagnosis not present

## 2017-03-14 DIAGNOSIS — N186 End stage renal disease: Secondary | ICD-10-CM | POA: Diagnosis not present

## 2017-03-14 DIAGNOSIS — E162 Hypoglycemia, unspecified: Secondary | ICD-10-CM | POA: Diagnosis not present

## 2017-03-14 DIAGNOSIS — N2581 Secondary hyperparathyroidism of renal origin: Secondary | ICD-10-CM | POA: Diagnosis not present

## 2017-03-14 DIAGNOSIS — E8779 Other fluid overload: Secondary | ICD-10-CM | POA: Diagnosis not present

## 2017-03-15 ENCOUNTER — Other Ambulatory Visit: Payer: Self-pay

## 2017-03-15 ENCOUNTER — Telehealth: Payer: Self-pay | Admitting: Endocrinology

## 2017-03-15 MED ORDER — GLUCOSE BLOOD VI STRP: ORAL_STRIP | 11 refills | Status: DC

## 2017-03-15 NOTE — Telephone Encounter (Signed)
[facility] on [address] called re:  script for OneTouch Verio Test Strips needs to be resent with the code.

## 2017-03-15 NOTE — Telephone Encounter (Signed)
I have resent prescription with DX code.

## 2017-03-16 DIAGNOSIS — N2581 Secondary hyperparathyroidism of renal origin: Secondary | ICD-10-CM | POA: Diagnosis not present

## 2017-03-16 DIAGNOSIS — N186 End stage renal disease: Secondary | ICD-10-CM | POA: Diagnosis not present

## 2017-03-16 DIAGNOSIS — E162 Hypoglycemia, unspecified: Secondary | ICD-10-CM | POA: Diagnosis not present

## 2017-03-16 DIAGNOSIS — D631 Anemia in chronic kidney disease: Secondary | ICD-10-CM | POA: Diagnosis not present

## 2017-03-16 DIAGNOSIS — E8779 Other fluid overload: Secondary | ICD-10-CM | POA: Diagnosis not present

## 2017-03-18 DIAGNOSIS — N186 End stage renal disease: Secondary | ICD-10-CM | POA: Diagnosis not present

## 2017-03-18 DIAGNOSIS — E162 Hypoglycemia, unspecified: Secondary | ICD-10-CM | POA: Diagnosis not present

## 2017-03-18 DIAGNOSIS — N2581 Secondary hyperparathyroidism of renal origin: Secondary | ICD-10-CM | POA: Diagnosis not present

## 2017-03-18 DIAGNOSIS — E8779 Other fluid overload: Secondary | ICD-10-CM | POA: Diagnosis not present

## 2017-03-18 DIAGNOSIS — D631 Anemia in chronic kidney disease: Secondary | ICD-10-CM | POA: Diagnosis not present

## 2017-03-20 DIAGNOSIS — N2581 Secondary hyperparathyroidism of renal origin: Secondary | ICD-10-CM | POA: Diagnosis not present

## 2017-03-20 DIAGNOSIS — N186 End stage renal disease: Secondary | ICD-10-CM | POA: Diagnosis not present

## 2017-03-20 DIAGNOSIS — E8779 Other fluid overload: Secondary | ICD-10-CM | POA: Diagnosis not present

## 2017-03-20 DIAGNOSIS — D631 Anemia in chronic kidney disease: Secondary | ICD-10-CM | POA: Diagnosis not present

## 2017-03-20 DIAGNOSIS — E162 Hypoglycemia, unspecified: Secondary | ICD-10-CM | POA: Diagnosis not present

## 2017-03-23 DIAGNOSIS — N2581 Secondary hyperparathyroidism of renal origin: Secondary | ICD-10-CM | POA: Diagnosis not present

## 2017-03-23 DIAGNOSIS — D631 Anemia in chronic kidney disease: Secondary | ICD-10-CM | POA: Diagnosis not present

## 2017-03-23 DIAGNOSIS — E162 Hypoglycemia, unspecified: Secondary | ICD-10-CM | POA: Diagnosis not present

## 2017-03-23 DIAGNOSIS — N186 End stage renal disease: Secondary | ICD-10-CM | POA: Diagnosis not present

## 2017-03-23 DIAGNOSIS — E8779 Other fluid overload: Secondary | ICD-10-CM | POA: Diagnosis not present

## 2017-03-25 DIAGNOSIS — N2581 Secondary hyperparathyroidism of renal origin: Secondary | ICD-10-CM | POA: Diagnosis not present

## 2017-03-25 DIAGNOSIS — N186 End stage renal disease: Secondary | ICD-10-CM | POA: Diagnosis not present

## 2017-03-25 DIAGNOSIS — E8779 Other fluid overload: Secondary | ICD-10-CM | POA: Diagnosis not present

## 2017-03-25 DIAGNOSIS — E162 Hypoglycemia, unspecified: Secondary | ICD-10-CM | POA: Diagnosis not present

## 2017-03-25 DIAGNOSIS — D631 Anemia in chronic kidney disease: Secondary | ICD-10-CM | POA: Diagnosis not present

## 2017-03-28 DIAGNOSIS — E162 Hypoglycemia, unspecified: Secondary | ICD-10-CM | POA: Diagnosis not present

## 2017-03-28 DIAGNOSIS — N186 End stage renal disease: Secondary | ICD-10-CM | POA: Diagnosis not present

## 2017-03-28 DIAGNOSIS — E8779 Other fluid overload: Secondary | ICD-10-CM | POA: Diagnosis not present

## 2017-03-28 DIAGNOSIS — N2581 Secondary hyperparathyroidism of renal origin: Secondary | ICD-10-CM | POA: Diagnosis not present

## 2017-03-28 DIAGNOSIS — D631 Anemia in chronic kidney disease: Secondary | ICD-10-CM | POA: Diagnosis not present

## 2017-03-29 DIAGNOSIS — Z992 Dependence on renal dialysis: Secondary | ICD-10-CM | POA: Diagnosis not present

## 2017-03-29 DIAGNOSIS — Z8674 Personal history of sudden cardiac arrest: Secondary | ICD-10-CM | POA: Diagnosis not present

## 2017-03-29 DIAGNOSIS — I429 Cardiomyopathy, unspecified: Secondary | ICD-10-CM | POA: Diagnosis not present

## 2017-03-29 DIAGNOSIS — N186 End stage renal disease: Secondary | ICD-10-CM | POA: Diagnosis not present

## 2017-03-29 DIAGNOSIS — I10 Essential (primary) hypertension: Secondary | ICD-10-CM | POA: Diagnosis not present

## 2017-03-30 DIAGNOSIS — N2581 Secondary hyperparathyroidism of renal origin: Secondary | ICD-10-CM | POA: Diagnosis not present

## 2017-03-30 DIAGNOSIS — E8779 Other fluid overload: Secondary | ICD-10-CM | POA: Diagnosis not present

## 2017-03-30 DIAGNOSIS — D631 Anemia in chronic kidney disease: Secondary | ICD-10-CM | POA: Diagnosis not present

## 2017-03-30 DIAGNOSIS — E162 Hypoglycemia, unspecified: Secondary | ICD-10-CM | POA: Diagnosis not present

## 2017-03-30 DIAGNOSIS — N186 End stage renal disease: Secondary | ICD-10-CM | POA: Diagnosis not present

## 2017-04-01 DIAGNOSIS — E8779 Other fluid overload: Secondary | ICD-10-CM | POA: Diagnosis not present

## 2017-04-01 DIAGNOSIS — N2581 Secondary hyperparathyroidism of renal origin: Secondary | ICD-10-CM | POA: Diagnosis not present

## 2017-04-01 DIAGNOSIS — N186 End stage renal disease: Secondary | ICD-10-CM | POA: Diagnosis not present

## 2017-04-01 DIAGNOSIS — D631 Anemia in chronic kidney disease: Secondary | ICD-10-CM | POA: Diagnosis not present

## 2017-04-01 DIAGNOSIS — E162 Hypoglycemia, unspecified: Secondary | ICD-10-CM | POA: Diagnosis not present

## 2017-04-04 DIAGNOSIS — N186 End stage renal disease: Secondary | ICD-10-CM | POA: Diagnosis not present

## 2017-04-04 DIAGNOSIS — D631 Anemia in chronic kidney disease: Secondary | ICD-10-CM | POA: Diagnosis not present

## 2017-04-04 DIAGNOSIS — E162 Hypoglycemia, unspecified: Secondary | ICD-10-CM | POA: Diagnosis not present

## 2017-04-04 DIAGNOSIS — N2581 Secondary hyperparathyroidism of renal origin: Secondary | ICD-10-CM | POA: Diagnosis not present

## 2017-04-04 DIAGNOSIS — E8779 Other fluid overload: Secondary | ICD-10-CM | POA: Diagnosis not present

## 2017-04-06 DIAGNOSIS — D631 Anemia in chronic kidney disease: Secondary | ICD-10-CM | POA: Diagnosis not present

## 2017-04-06 DIAGNOSIS — E1122 Type 2 diabetes mellitus with diabetic chronic kidney disease: Secondary | ICD-10-CM | POA: Diagnosis not present

## 2017-04-06 DIAGNOSIS — Z992 Dependence on renal dialysis: Secondary | ICD-10-CM | POA: Diagnosis not present

## 2017-04-06 DIAGNOSIS — E162 Hypoglycemia, unspecified: Secondary | ICD-10-CM | POA: Diagnosis not present

## 2017-04-06 DIAGNOSIS — N2581 Secondary hyperparathyroidism of renal origin: Secondary | ICD-10-CM | POA: Diagnosis not present

## 2017-04-06 DIAGNOSIS — N186 End stage renal disease: Secondary | ICD-10-CM | POA: Diagnosis not present

## 2017-04-06 DIAGNOSIS — E8779 Other fluid overload: Secondary | ICD-10-CM | POA: Diagnosis not present

## 2017-04-07 DIAGNOSIS — Z992 Dependence on renal dialysis: Secondary | ICD-10-CM | POA: Diagnosis not present

## 2017-04-07 DIAGNOSIS — E162 Hypoglycemia, unspecified: Secondary | ICD-10-CM | POA: Diagnosis not present

## 2017-04-07 DIAGNOSIS — N2581 Secondary hyperparathyroidism of renal origin: Secondary | ICD-10-CM | POA: Diagnosis not present

## 2017-04-07 DIAGNOSIS — E1021 Type 1 diabetes mellitus with diabetic nephropathy: Secondary | ICD-10-CM | POA: Diagnosis not present

## 2017-04-07 DIAGNOSIS — D631 Anemia in chronic kidney disease: Secondary | ICD-10-CM | POA: Diagnosis not present

## 2017-04-07 DIAGNOSIS — E1122 Type 2 diabetes mellitus with diabetic chronic kidney disease: Secondary | ICD-10-CM | POA: Diagnosis not present

## 2017-04-07 DIAGNOSIS — N186 End stage renal disease: Secondary | ICD-10-CM | POA: Diagnosis not present

## 2017-04-07 DIAGNOSIS — E8779 Other fluid overload: Secondary | ICD-10-CM | POA: Diagnosis not present

## 2017-04-07 DIAGNOSIS — Z7682 Awaiting organ transplant status: Secondary | ICD-10-CM | POA: Diagnosis not present

## 2017-04-07 DIAGNOSIS — Z1159 Encounter for screening for other viral diseases: Secondary | ICD-10-CM | POA: Diagnosis not present

## 2017-04-11 DIAGNOSIS — D631 Anemia in chronic kidney disease: Secondary | ICD-10-CM | POA: Diagnosis not present

## 2017-04-11 DIAGNOSIS — E162 Hypoglycemia, unspecified: Secondary | ICD-10-CM | POA: Diagnosis not present

## 2017-04-11 DIAGNOSIS — E8779 Other fluid overload: Secondary | ICD-10-CM | POA: Diagnosis not present

## 2017-04-11 DIAGNOSIS — N186 End stage renal disease: Secondary | ICD-10-CM | POA: Diagnosis not present

## 2017-04-11 DIAGNOSIS — N2581 Secondary hyperparathyroidism of renal origin: Secondary | ICD-10-CM | POA: Diagnosis not present

## 2017-04-13 DIAGNOSIS — E8779 Other fluid overload: Secondary | ICD-10-CM | POA: Diagnosis not present

## 2017-04-13 DIAGNOSIS — N2581 Secondary hyperparathyroidism of renal origin: Secondary | ICD-10-CM | POA: Diagnosis not present

## 2017-04-13 DIAGNOSIS — N186 End stage renal disease: Secondary | ICD-10-CM | POA: Diagnosis not present

## 2017-04-13 DIAGNOSIS — E162 Hypoglycemia, unspecified: Secondary | ICD-10-CM | POA: Diagnosis not present

## 2017-04-13 DIAGNOSIS — D631 Anemia in chronic kidney disease: Secondary | ICD-10-CM | POA: Diagnosis not present

## 2017-04-15 DIAGNOSIS — N2581 Secondary hyperparathyroidism of renal origin: Secondary | ICD-10-CM | POA: Diagnosis not present

## 2017-04-15 DIAGNOSIS — E8779 Other fluid overload: Secondary | ICD-10-CM | POA: Diagnosis not present

## 2017-04-15 DIAGNOSIS — N186 End stage renal disease: Secondary | ICD-10-CM | POA: Diagnosis not present

## 2017-04-15 DIAGNOSIS — D631 Anemia in chronic kidney disease: Secondary | ICD-10-CM | POA: Diagnosis not present

## 2017-04-15 DIAGNOSIS — E162 Hypoglycemia, unspecified: Secondary | ICD-10-CM | POA: Diagnosis not present

## 2017-04-18 DIAGNOSIS — D631 Anemia in chronic kidney disease: Secondary | ICD-10-CM | POA: Diagnosis not present

## 2017-04-18 DIAGNOSIS — N186 End stage renal disease: Secondary | ICD-10-CM | POA: Diagnosis not present

## 2017-04-18 DIAGNOSIS — N2581 Secondary hyperparathyroidism of renal origin: Secondary | ICD-10-CM | POA: Diagnosis not present

## 2017-04-18 DIAGNOSIS — E162 Hypoglycemia, unspecified: Secondary | ICD-10-CM | POA: Diagnosis not present

## 2017-04-18 DIAGNOSIS — E8779 Other fluid overload: Secondary | ICD-10-CM | POA: Diagnosis not present

## 2017-04-20 DIAGNOSIS — N186 End stage renal disease: Secondary | ICD-10-CM | POA: Diagnosis not present

## 2017-04-20 DIAGNOSIS — E8779 Other fluid overload: Secondary | ICD-10-CM | POA: Diagnosis not present

## 2017-04-20 DIAGNOSIS — N2581 Secondary hyperparathyroidism of renal origin: Secondary | ICD-10-CM | POA: Diagnosis not present

## 2017-04-20 DIAGNOSIS — E162 Hypoglycemia, unspecified: Secondary | ICD-10-CM | POA: Diagnosis not present

## 2017-04-20 DIAGNOSIS — D631 Anemia in chronic kidney disease: Secondary | ICD-10-CM | POA: Diagnosis not present

## 2017-04-22 DIAGNOSIS — E162 Hypoglycemia, unspecified: Secondary | ICD-10-CM | POA: Diagnosis not present

## 2017-04-22 DIAGNOSIS — E8779 Other fluid overload: Secondary | ICD-10-CM | POA: Diagnosis not present

## 2017-04-22 DIAGNOSIS — D631 Anemia in chronic kidney disease: Secondary | ICD-10-CM | POA: Diagnosis not present

## 2017-04-22 DIAGNOSIS — N2581 Secondary hyperparathyroidism of renal origin: Secondary | ICD-10-CM | POA: Diagnosis not present

## 2017-04-22 DIAGNOSIS — N186 End stage renal disease: Secondary | ICD-10-CM | POA: Diagnosis not present

## 2017-04-25 DIAGNOSIS — E8779 Other fluid overload: Secondary | ICD-10-CM | POA: Diagnosis not present

## 2017-04-25 DIAGNOSIS — D631 Anemia in chronic kidney disease: Secondary | ICD-10-CM | POA: Diagnosis not present

## 2017-04-25 DIAGNOSIS — N2581 Secondary hyperparathyroidism of renal origin: Secondary | ICD-10-CM | POA: Diagnosis not present

## 2017-04-25 DIAGNOSIS — E162 Hypoglycemia, unspecified: Secondary | ICD-10-CM | POA: Diagnosis not present

## 2017-04-25 DIAGNOSIS — N186 End stage renal disease: Secondary | ICD-10-CM | POA: Diagnosis not present

## 2017-04-26 ENCOUNTER — Other Ambulatory Visit: Payer: Self-pay

## 2017-04-26 DIAGNOSIS — E1022 Type 1 diabetes mellitus with diabetic chronic kidney disease: Secondary | ICD-10-CM | POA: Diagnosis not present

## 2017-04-26 DIAGNOSIS — Z794 Long term (current) use of insulin: Secondary | ICD-10-CM | POA: Diagnosis not present

## 2017-04-26 DIAGNOSIS — N185 Chronic kidney disease, stage 5: Secondary | ICD-10-CM | POA: Diagnosis not present

## 2017-04-26 DIAGNOSIS — E1065 Type 1 diabetes mellitus with hyperglycemia: Secondary | ICD-10-CM | POA: Diagnosis not present

## 2017-04-26 MED ORDER — GLUCOSE BLOOD VI STRP: ORAL_STRIP | 11 refills | Status: DC

## 2017-04-27 DIAGNOSIS — N2581 Secondary hyperparathyroidism of renal origin: Secondary | ICD-10-CM | POA: Diagnosis not present

## 2017-04-27 DIAGNOSIS — N186 End stage renal disease: Secondary | ICD-10-CM | POA: Diagnosis not present

## 2017-04-27 DIAGNOSIS — D631 Anemia in chronic kidney disease: Secondary | ICD-10-CM | POA: Diagnosis not present

## 2017-04-27 DIAGNOSIS — E8779 Other fluid overload: Secondary | ICD-10-CM | POA: Diagnosis not present

## 2017-04-27 DIAGNOSIS — E162 Hypoglycemia, unspecified: Secondary | ICD-10-CM | POA: Diagnosis not present

## 2017-04-28 ENCOUNTER — Other Ambulatory Visit: Payer: Self-pay

## 2017-04-28 MED ORDER — GLUCOSE BLOOD VI STRP: ORAL_STRIP | 11 refills | Status: DC

## 2017-04-29 DIAGNOSIS — N186 End stage renal disease: Secondary | ICD-10-CM | POA: Diagnosis not present

## 2017-04-29 DIAGNOSIS — E8779 Other fluid overload: Secondary | ICD-10-CM | POA: Diagnosis not present

## 2017-04-29 DIAGNOSIS — E162 Hypoglycemia, unspecified: Secondary | ICD-10-CM | POA: Diagnosis not present

## 2017-04-29 DIAGNOSIS — N2581 Secondary hyperparathyroidism of renal origin: Secondary | ICD-10-CM | POA: Diagnosis not present

## 2017-04-29 DIAGNOSIS — D631 Anemia in chronic kidney disease: Secondary | ICD-10-CM | POA: Diagnosis not present

## 2017-05-02 DIAGNOSIS — E162 Hypoglycemia, unspecified: Secondary | ICD-10-CM | POA: Diagnosis not present

## 2017-05-02 DIAGNOSIS — D631 Anemia in chronic kidney disease: Secondary | ICD-10-CM | POA: Diagnosis not present

## 2017-05-02 DIAGNOSIS — N186 End stage renal disease: Secondary | ICD-10-CM | POA: Diagnosis not present

## 2017-05-02 DIAGNOSIS — E8779 Other fluid overload: Secondary | ICD-10-CM | POA: Diagnosis not present

## 2017-05-02 DIAGNOSIS — N2581 Secondary hyperparathyroidism of renal origin: Secondary | ICD-10-CM | POA: Diagnosis not present

## 2017-05-04 DIAGNOSIS — N2581 Secondary hyperparathyroidism of renal origin: Secondary | ICD-10-CM | POA: Diagnosis not present

## 2017-05-04 DIAGNOSIS — N186 End stage renal disease: Secondary | ICD-10-CM | POA: Diagnosis not present

## 2017-05-04 DIAGNOSIS — E8779 Other fluid overload: Secondary | ICD-10-CM | POA: Diagnosis not present

## 2017-05-04 DIAGNOSIS — E162 Hypoglycemia, unspecified: Secondary | ICD-10-CM | POA: Diagnosis not present

## 2017-05-04 DIAGNOSIS — D631 Anemia in chronic kidney disease: Secondary | ICD-10-CM | POA: Diagnosis not present

## 2017-05-05 DIAGNOSIS — N186 End stage renal disease: Secondary | ICD-10-CM | POA: Diagnosis not present

## 2017-05-05 DIAGNOSIS — E1122 Type 2 diabetes mellitus with diabetic chronic kidney disease: Secondary | ICD-10-CM | POA: Diagnosis not present

## 2017-05-05 DIAGNOSIS — Z992 Dependence on renal dialysis: Secondary | ICD-10-CM | POA: Diagnosis not present

## 2017-05-06 DIAGNOSIS — D631 Anemia in chronic kidney disease: Secondary | ICD-10-CM | POA: Diagnosis not present

## 2017-05-06 DIAGNOSIS — E162 Hypoglycemia, unspecified: Secondary | ICD-10-CM | POA: Diagnosis not present

## 2017-05-06 DIAGNOSIS — D509 Iron deficiency anemia, unspecified: Secondary | ICD-10-CM | POA: Diagnosis not present

## 2017-05-06 DIAGNOSIS — E8779 Other fluid overload: Secondary | ICD-10-CM | POA: Diagnosis not present

## 2017-05-06 DIAGNOSIS — N186 End stage renal disease: Secondary | ICD-10-CM | POA: Diagnosis not present

## 2017-05-06 DIAGNOSIS — N2581 Secondary hyperparathyroidism of renal origin: Secondary | ICD-10-CM | POA: Diagnosis not present

## 2017-05-08 ENCOUNTER — Encounter (HOSPITAL_COMMUNITY): Payer: Self-pay | Admitting: Emergency Medicine

## 2017-05-08 ENCOUNTER — Emergency Department (HOSPITAL_COMMUNITY)
Admission: EM | Admit: 2017-05-08 | Discharge: 2017-05-08 | Disposition: A | Discharge status: Home / Self Care | Payer: Medicare Other | Attending: Emergency Medicine | Admitting: Emergency Medicine

## 2017-05-08 DIAGNOSIS — E10649 Type 1 diabetes mellitus with hypoglycemia without coma: Secondary | ICD-10-CM | POA: Insufficient documentation

## 2017-05-08 DIAGNOSIS — E1040 Type 1 diabetes mellitus with diabetic neuropathy, unspecified: Secondary | ICD-10-CM | POA: Diagnosis not present

## 2017-05-08 DIAGNOSIS — G40909 Epilepsy, unspecified, not intractable, without status epilepticus: Secondary | ICD-10-CM | POA: Diagnosis not present

## 2017-05-08 DIAGNOSIS — E162 Hypoglycemia, unspecified: Secondary | ICD-10-CM | POA: Diagnosis not present

## 2017-05-08 DIAGNOSIS — Z992 Dependence on renal dialysis: Secondary | ICD-10-CM | POA: Insufficient documentation

## 2017-05-08 DIAGNOSIS — Z79899 Other long term (current) drug therapy: Secondary | ICD-10-CM | POA: Diagnosis not present

## 2017-05-08 DIAGNOSIS — R569 Unspecified convulsions: Secondary | ICD-10-CM | POA: Insufficient documentation

## 2017-05-08 DIAGNOSIS — N186 End stage renal disease: Secondary | ICD-10-CM | POA: Diagnosis not present

## 2017-05-08 DIAGNOSIS — E161 Other hypoglycemia: Secondary | ICD-10-CM | POA: Diagnosis not present

## 2017-05-08 DIAGNOSIS — I10 Essential (primary) hypertension: Secondary | ICD-10-CM | POA: Diagnosis not present

## 2017-05-08 DIAGNOSIS — E1022 Type 1 diabetes mellitus with diabetic chronic kidney disease: Secondary | ICD-10-CM | POA: Insufficient documentation

## 2017-05-08 DIAGNOSIS — I120 Hypertensive chronic kidney disease with stage 5 chronic kidney disease or end stage renal disease: Secondary | ICD-10-CM | POA: Diagnosis not present

## 2017-05-08 LAB — I-STAT CHEM 8, ED
BUN: 48 mg/dL — ABNORMAL HIGH (ref 6–20)
CREATININE: 12.8 mg/dL — AB (ref 0.61–1.24)
Calcium, Ion: 1.07 mmol/L — ABNORMAL LOW (ref 1.15–1.40)
Chloride: 102 mmol/L (ref 101–111)
Glucose, Bld: 231 mg/dL — ABNORMAL HIGH (ref 65–99)
HCT: 40 % (ref 39.0–52.0)
HEMOGLOBIN: 13.6 g/dL (ref 13.0–17.0)
Potassium: 4.2 mmol/L (ref 3.5–5.1)
Sodium: 137 mmol/L (ref 135–145)
TCO2: 23 mmol/L (ref 22–32)

## 2017-05-08 LAB — CBC WITH DIFFERENTIAL/PLATELET
BASOS ABS: 0 10*3/uL (ref 0.0–0.1)
BASOS PCT: 0 %
Eosinophils Absolute: 0 10*3/uL (ref 0.0–0.7)
Eosinophils Relative: 1 %
HEMATOCRIT: 38.8 % — AB (ref 39.0–52.0)
Hemoglobin: 13 g/dL (ref 13.0–17.0)
Lymphocytes Relative: 30 %
Lymphs Abs: 1.1 10*3/uL (ref 0.7–4.0)
MCH: 28.8 pg (ref 26.0–34.0)
MCHC: 33.5 g/dL (ref 30.0–36.0)
MCV: 85.8 fL (ref 78.0–100.0)
MONO ABS: 0.3 10*3/uL (ref 0.1–1.0)
Monocytes Relative: 8 %
NEUTROS ABS: 2.3 10*3/uL (ref 1.7–7.7)
NEUTROS PCT: 61 %
Platelets: 241 10*3/uL (ref 150–400)
RBC: 4.52 MIL/uL (ref 4.22–5.81)
RDW: 14.4 % (ref 11.5–15.5)
WBC: 3.7 10*3/uL — AB (ref 4.0–10.5)

## 2017-05-08 LAB — COMPREHENSIVE METABOLIC PANEL
ALT: 19 U/L (ref 17–63)
AST: 26 U/L (ref 15–41)
Albumin: 4 g/dL (ref 3.5–5.0)
Alkaline Phosphatase: 83 U/L (ref 38–126)
Anion gap: 18 — ABNORMAL HIGH (ref 5–15)
BUN: 53 mg/dL — AB (ref 6–20)
CHLORIDE: 98 mmol/L — AB (ref 101–111)
CO2: 21 mmol/L — AB (ref 22–32)
CREATININE: 12.03 mg/dL — AB (ref 0.61–1.24)
Calcium: 9.2 mg/dL (ref 8.9–10.3)
GFR calc Af Amer: 6 mL/min — ABNORMAL LOW (ref >60)
GFR, EST NON AFRICAN AMERICAN: 5 mL/min — AB (ref >60)
Glucose, Bld: 235 mg/dL — ABNORMAL HIGH (ref 65–99)
Potassium: 4.1 mmol/L (ref 3.5–5.1)
SODIUM: 137 mmol/L (ref 135–145)
Total Bilirubin: 0.6 mg/dL (ref 0.3–1.2)
Total Protein: 7.7 g/dL (ref 6.5–8.1)

## 2017-05-08 LAB — CBG MONITORING, ED: Glucose-Capillary: 252 mg/dL — ABNORMAL HIGH (ref 65–99)

## 2017-05-08 MED ORDER — SODIUM CHLORIDE 0.9 % IV BOLUS (SEPSIS)
1000.0000 mL | Freq: Once | INTRAVENOUS | Status: AC
  Administered 2017-05-08: 1000 mL via INTRAVENOUS

## 2017-05-08 MED ORDER — DEXTROSE 50 % IV SOLN
1.0000 | Freq: Once | INTRAVENOUS | Status: AC
Start: 2017-05-08 — End: 2017-05-08
  Administered 2017-05-08: 50 mL via INTRAVENOUS
  Filled 2017-05-08: qty 50

## 2017-05-08 NOTE — ED Notes (Signed)
Md aware of pt BP levels; pt states BP is always high--MD oked to [facility]

## 2017-05-08 NOTE — ED Triage Notes (Signed)
Per [facility] patient coming from home initial glucose 34. Patient unresponsive on ems arrival. 1 mg glucagon IM administered patient becomes alert and oriented x 4. 12.5 g oral glucose administered. Patient begins seizing, cbg 135. EMS bagged patient for short period of time following seizure. A/o x4 again.Another 12.5 oral glucose administered. 198 cbg upon arrival. Patient seizures again in ed room. Recent change in diet to only fish and ground grown foods. Patient took 30 units of long acting insulin this morning and hasn't eaten since last night.

## 2017-05-08 NOTE — ED Provider Notes (Signed)
[facility] EMERGENCY DEPARTMENT Provider Note   CSN: [id number] Arrival date & time: 05/08/17  1823     History   Chief Complaint Chief Complaint  Patient presents with  . Hypoglycemia  . Seizures    HPI Edward Colon is a 37 y.o. male.  37 yo M with a chief complaint of a seizure.  EMS was called to his house due to a low blood sugar.  Found to have a blood sugar into the 30s.  They gave him some IM glucagon and the patient had improvement of his mental status but then began to seize.  The patient has not had any recent infectious symptoms per the family.  Denies trauma to the head.  Denied cough congestion fevers chills myalgias vomiting or diarrhea.  When the patient was not postictal he provided that he thought maybe he had eaten a little bit less than normal.  Had taken his insulin is normal.  He denied infectious symptoms.  Denies trauma to the head.   The history is provided by the patient.  Hypoglycemia  Associated symptoms: seizures   Associated symptoms: no shortness of breath, no tremors and no vomiting   Seizures   Pertinent negatives include no confusion, no headaches, no visual disturbance, no chest pain, no vomiting and no diarrhea.  Illness  This is a new problem. The current episode started 1 to 2 hours ago. The problem occurs constantly. The problem has not changed since onset.Pertinent negatives include no chest pain, no abdominal pain, no headaches and no shortness of breath. Nothing aggravates the symptoms. Nothing relieves the symptoms. He has tried nothing for the symptoms. The treatment provided no relief.    Past Medical History:  Diagnosis Date  . Anemia   . Blind left eye since ~ 2010  . Depression   . Diabetic peripheral neuropathy ([place])   . ESRD (end stage renal disease) on dialysis [facility])    "TTS; [facility]; [facility]" (02/01/2016)  . Heart murmur    denies any problems with it  . Hypertension   . Seizures ([place])    "last one was end of 2016; they are related to my diabetes" (02/01/2016)  . Type 1 diabetes ([place]) dx'd 1990    Patient Active Problem List   Diagnosis Date Noted  . Cardiac arrest ([place])   . DKA (diabetic ketoacidoses) ([place]) 03/05/2017  . Ventricular tachycardia ([place]) 03/05/2017  . Acute respiratory failure ([place])   . Involuntary commitment 01/29/2016  . Suicide attempt by substance overdose ([place]) 01/28/2016  . ESRD (end stage renal disease) ([place]) 03/07/2015  . Hyperglycemia 03/06/2015  . Acute on chronic renal failure ([place]) 02/27/2015  . URI (upper respiratory infection) 02/27/2015  . Acute-on-chronic kidney injury ([place]) 02/27/2015  . Rhabdomyolysis 08/31/2014  . Hypoglycemia 08/30/2014  . Type 1 diabetes ([place]) 08/30/2014  . CKD stage 2 due to type 2 diabetes mellitus ([place]) 08/30/2014  . Anemia 08/30/2014  . Swelling of arm 05/31/2013  . ARF (acute renal failure) ([place]) 05/31/2013  . Hyperkalemia 05/31/2013  . Seizure ([place]) 05/31/2013  . Seizures ([place])   . Hypertension     Past Surgical History:  Procedure Laterality Date  . AV FISTULA PLACEMENT Right 03/03/2015   Procedure: RIGHT RADIO-CEPHALIC ARTERIOVENOUS (AV) FISTULA CREATION;  Surgeon: Serafina Mitchell, MD;  [facility] OR;  Service: Vascular;  Laterality: Right;  . AV FISTULA PLACEMENT Left 06/15/2015   Procedure: INSERTION OF LEFT UPPER ARM  ARTERIOVENOUS (AV) 46mm x 50cm GORE-TEX  GRAFT;  Surgeon: Elam Dutch, MD;  Location: [facility];  Service: Vascular;  Laterality: Left;  . BASCILIC VEIN TRANSPOSITION Left 04/01/2015   Procedure: BASCILIC VEIN TRANSPOSITION-LEFT ARM- FIRST STAGE;  Surgeon: Elam Dutch, MD;  Location: [facility];  Service: Vascular;  Laterality: Left;  . EYE SURGERY Right ~ 2010   for diabetic retinopathy  . INSERTION OF DIALYSIS CATHETER Right 03/03/2015   Procedure: INSERTION OF DIALYSIS CATHETER;  Surgeon: Serafina Mitchell, MD;  [facility]: [facility] OR;  Service: Vascular;  Laterality: Right;       Home  Medications    Prior to Admission medications   Medication Sig Start Date End Date Taking? Authorizing Provider  calcium acetate (PHOSLO) 667 MG capsule Take 1 capsule (667 mg total) by mouth 3 (three) times daily with meals. Patient taking differently: Take 1,334-2,668 mg by mouth See admin instructions. Take 4 capsules (2668 mg) by mouth with meals and 2 capsules (1334 mg) with snacks 03/07/15   [name], MD  FLUoxetine (PROZAC) 20 MG capsule Take 1 capsule (20 mg total) by mouth daily. Patient not taking: Reported on 03/05/2017 02/04/16   Domenic Polite, MD  glucose blood (ONETOUCH VERIO) test strip USE TO TEST BLOOD SUGAR 4 TIMES DAILY. DX CODE E10.22. 04/28/17   Renato Shin, MD  hydrOXYzine (ATARAX/VISTARIL) 25 MG tablet Take 1 tablet (25 mg total) by mouth at bedtime as needed and may repeat dose one time if needed for anxiety (insomnia.). Patient not taking: Reported on 03/05/2017 02/03/16   Domenic Polite, MD  Insulin Glargine (LANTUS SOLOSTAR) 100 UNIT/ML Solostar Pen Inject 20 Units into the skin daily at 10 pm. And pen needles 1/day 03/08/17   Reyne Dumas, MD  metoprolol tartrate (LOPRESSOR) 25 MG tablet Take 1 tablet (25 mg total) by mouth every morning. 03/08/17 03/08/18  Reyne Dumas, MD  multivitamin (RENA-VIT) TABS tablet Take 1 tablet by mouth at bedtime. Patient taking differently: Take 1 tablet by mouth daily.  03/07/15   [name], MD    Family History Family History  Problem Relation Age of Onset  . Diabetes Neg Hx     Social History Social History   Tobacco Use  . Smoking status: Never Smoker  . Smokeless tobacco: Never Used  Substance Use Topics  . Alcohol use: No    Frequency: Never    Comment: 02/01/2016 "might have 1 drink/year"  . Drug use: No     Allergies   Patient has no known allergies.   Review of Systems Review of Systems  Constitutional: Negative for chills and fever.  HENT: Negative for congestion and facial swelling.     Eyes: Negative for discharge and visual disturbance.  Respiratory: Negative for shortness of breath.   Cardiovascular: Negative for chest pain and palpitations.  Gastrointestinal: Negative for abdominal pain, diarrhea and vomiting.  Musculoskeletal: Negative for arthralgias and myalgias.  Skin: Negative for color change and rash.  Neurological: Positive for seizures. Negative for tremors, syncope and headaches.  Psychiatric/Behavioral: Negative for confusion and dysphoric mood.     Physical Exam Updated Vital Signs BP (!) 206/107   Pulse 96   Resp (!) 22   Ht 5\' 11"  (1.803 m)   Wt 90.7 kg (200 lb)   SpO2 98%   BMI 27.89 kg/m   Physical Exam  Constitutional: He appears well-developed and well-nourished.  HENT:  Head: Normocephalic and atraumatic.  Eyes: EOM are normal. Pupils are equal, round, and reactive to light.  Neck: Normal range of  motion. Neck supple. No JVD present.  Cardiovascular: Normal rate and regular rhythm. Exam reveals no gallop and no friction rub.  No murmur heard. Pulmonary/Chest: No respiratory distress. He has no wheezes.  Abdominal: He exhibits no distension and no mass. There is no tenderness. There is no rebound and no guarding.  Musculoskeletal: Normal range of motion.  Neurological: He is alert.  Post ictal.  Confused to scenario  Skin: No rash noted. No pallor.  Psychiatric: He has a normal mood and affect. His behavior is normal.  Nursing note and vitals reviewed.    ED Treatments / Results  Labs (all labs ordered are listed, but only abnormal results are displayed) Labs Reviewed  CBC WITH DIFFERENTIAL/PLATELET - Abnormal; Notable for the following components:      Result Value   WBC 3.7 (*)    HCT 38.8 (*)    All other components within normal limits  COMPREHENSIVE METABOLIC PANEL - Abnormal; Notable for the following components:   Chloride 98 (*)    CO2 21 (*)    Glucose, Bld 235 (*)    BUN 53 (*)    Creatinine, Ser 12.03 (*)     GFR calc non Af Amer 5 (*)    GFR calc Af Amer 6 (*)    Anion gap 18 (*)    All other components within normal limits  I-STAT CHEM 8, ED - Abnormal; Notable for the following components:   BUN 48 (*)    Creatinine, Ser 12.80 (*)    Glucose, Bld 231 (*)    Calcium, Ion 1.07 (*)    All other components within normal limits  CBG MONITORING, ED - Abnormal; Notable for the following components:   Glucose-Capillary 252 (*)    All other components within normal limits    EKG  EKG Interpretation  Date/Time:  Monday May 08 2017 18:24:25 EST Ventricular Rate:  91 PR Interval:    QRS Duration: 94 QT Interval:  377 QTC Calculation: 464 R Axis:   50 Text Interpretation:  Sinus rhythm Probable left atrial enlargement No significant change since last tracing Confirmed by [name] [phone]) on 05/08/2017 6:44:20 PM       Radiology No results found.  Procedures Procedures (including critical care time) Procedure note: Ultrasound Guided Peripheral IV Ultrasound guided peripheral 1.88 inch angiocath IV placement performed by me. Indications: Nursing unable to place IV. Details: The antecubital fossa and upper arm were evaluated with a multifrequency linear probe. Patent brachial veins were noted. 1 attempt was made to cannulate a vein under realtime US guidance with successful cannulation of the vein and catheter placement. There is return of non-pulsatile dark red blood. The patient tolerated the procedure well without complications. Images archived electronically.  CPT codes: [phone] and [phone]  Medications Ordered in ED Medications  dextrose 50 % solution 50 mL (50 mLs Intravenous Given 05/08/17 1901)  sodium chloride 0.9 % bolus 1,000 mL (0 mLs Intravenous Stopped 05/08/17 2130)     Initial Impression / Assessment and Plan / ED Course  I have reviewed the triage vital signs and the nursing notes.  Pertinent labs & imaging results that were available during my care of the patient were  reviewed by me and considered in my medical decision making (see chart for details).     37 yo M with a chief complaint of a seizure.  The patient had multiple of these in route with EMS.  They resolved when he arrived to the emergency department.  The patient's initial blood sugar was in the 30s.  I suspect that this was the cause of his event.  He states he has been eating a little bit less today.  Will feed here, observe in th ED.  Obtain labs.    Patient continues to maintain his mental status.  Is able to ambulate without difficulty.  Blood sugars remain in the 200s.  Discussed risks and benefits of further observation and patient is electing to go home.  He will return for any worsening of his symptoms.  Pressure was discussed with the patient and he will follow-up with his family physician.  Discharge home.  10:57 PM:  I have discussed the diagnosis/risks/treatment options with the patient and family and believe the pt to be eligible for discharge home to follow-up with PCP. We also discussed returning to the ED immediately if new or worsening sx occur. We discussed the sx which are most concerning (e.g., sudden worsening pain, fever, inability to tolerate by mouth) that necessitate immediate return. Medications administered to the patient during their visit and any new prescriptions provided to the patient are listed below.  Medications given during this visit Medications  dextrose 50 % solution 50 mL (50 mLs Intravenous Given 05/08/17 1901)  sodium chloride 0.9 % bolus 1,000 mL (0 mLs Intravenous Stopped 05/08/17 2130)     The patient appears reasonably screen and/or stabilized for discharge and I doubt any other medical condition or other [facility] requiring further screening, evaluation, or treatment in the ED at this time prior to discharge.    Final Clinical Impressions(s) / ED Diagnoses   Final diagnoses:  Hypoglycemia    ED Discharge Orders    None       [name], DO 05/08/17  2257

## 2017-05-09 DIAGNOSIS — E8779 Other fluid overload: Secondary | ICD-10-CM | POA: Diagnosis not present

## 2017-05-09 DIAGNOSIS — D631 Anemia in chronic kidney disease: Secondary | ICD-10-CM | POA: Diagnosis not present

## 2017-05-09 DIAGNOSIS — E162 Hypoglycemia, unspecified: Secondary | ICD-10-CM | POA: Diagnosis not present

## 2017-05-09 DIAGNOSIS — N2581 Secondary hyperparathyroidism of renal origin: Secondary | ICD-10-CM | POA: Diagnosis not present

## 2017-05-09 DIAGNOSIS — N186 End stage renal disease: Secondary | ICD-10-CM | POA: Diagnosis not present

## 2017-05-09 DIAGNOSIS — D509 Iron deficiency anemia, unspecified: Secondary | ICD-10-CM | POA: Diagnosis not present

## 2017-05-09 LAB — CBG MONITORING, ED: Glucose-Capillary: 178 mg/dL — ABNORMAL HIGH (ref 65–99)

## 2017-05-10 ENCOUNTER — Telehealth: Payer: Self-pay | Admitting: *Deleted

## 2017-05-10 NOTE — Telephone Encounter (Signed)
PA for One Touch Verio test strips initiated via CoverMyMeds.

## 2017-05-11 DIAGNOSIS — D509 Iron deficiency anemia, unspecified: Secondary | ICD-10-CM | POA: Diagnosis not present

## 2017-05-11 DIAGNOSIS — D631 Anemia in chronic kidney disease: Secondary | ICD-10-CM | POA: Diagnosis not present

## 2017-05-11 DIAGNOSIS — N2581 Secondary hyperparathyroidism of renal origin: Secondary | ICD-10-CM | POA: Diagnosis not present

## 2017-05-11 DIAGNOSIS — E162 Hypoglycemia, unspecified: Secondary | ICD-10-CM | POA: Diagnosis not present

## 2017-05-11 DIAGNOSIS — N186 End stage renal disease: Secondary | ICD-10-CM | POA: Diagnosis not present

## 2017-05-11 DIAGNOSIS — E8779 Other fluid overload: Secondary | ICD-10-CM | POA: Diagnosis not present

## 2017-05-11 DIAGNOSIS — E1129 Type 2 diabetes mellitus with other diabetic kidney complication: Secondary | ICD-10-CM | POA: Diagnosis not present

## 2017-05-15 DIAGNOSIS — D509 Iron deficiency anemia, unspecified: Secondary | ICD-10-CM | POA: Diagnosis not present

## 2017-05-15 DIAGNOSIS — Z992 Dependence on renal dialysis: Secondary | ICD-10-CM | POA: Diagnosis not present

## 2017-05-15 DIAGNOSIS — I871 Compression of vein: Secondary | ICD-10-CM | POA: Diagnosis not present

## 2017-05-15 DIAGNOSIS — D631 Anemia in chronic kidney disease: Secondary | ICD-10-CM | POA: Diagnosis not present

## 2017-05-15 DIAGNOSIS — N2581 Secondary hyperparathyroidism of renal origin: Secondary | ICD-10-CM | POA: Diagnosis not present

## 2017-05-15 DIAGNOSIS — N186 End stage renal disease: Secondary | ICD-10-CM | POA: Diagnosis not present

## 2017-05-15 DIAGNOSIS — E162 Hypoglycemia, unspecified: Secondary | ICD-10-CM | POA: Diagnosis not present

## 2017-05-15 DIAGNOSIS — T82868A Thrombosis of vascular prosthetic devices, implants and grafts, initial encounter: Secondary | ICD-10-CM | POA: Diagnosis not present

## 2017-05-15 DIAGNOSIS — E8779 Other fluid overload: Secondary | ICD-10-CM | POA: Diagnosis not present

## 2017-05-16 DIAGNOSIS — E8779 Other fluid overload: Secondary | ICD-10-CM | POA: Diagnosis not present

## 2017-05-16 DIAGNOSIS — D509 Iron deficiency anemia, unspecified: Secondary | ICD-10-CM | POA: Diagnosis not present

## 2017-05-16 DIAGNOSIS — E162 Hypoglycemia, unspecified: Secondary | ICD-10-CM | POA: Diagnosis not present

## 2017-05-16 DIAGNOSIS — N186 End stage renal disease: Secondary | ICD-10-CM | POA: Diagnosis not present

## 2017-05-16 DIAGNOSIS — D631 Anemia in chronic kidney disease: Secondary | ICD-10-CM | POA: Diagnosis not present

## 2017-05-16 DIAGNOSIS — N2581 Secondary hyperparathyroidism of renal origin: Secondary | ICD-10-CM | POA: Diagnosis not present

## 2017-05-18 DIAGNOSIS — N186 End stage renal disease: Secondary | ICD-10-CM | POA: Diagnosis not present

## 2017-05-18 DIAGNOSIS — N2581 Secondary hyperparathyroidism of renal origin: Secondary | ICD-10-CM | POA: Diagnosis not present

## 2017-05-18 DIAGNOSIS — D631 Anemia in chronic kidney disease: Secondary | ICD-10-CM | POA: Diagnosis not present

## 2017-05-18 DIAGNOSIS — E162 Hypoglycemia, unspecified: Secondary | ICD-10-CM | POA: Diagnosis not present

## 2017-05-18 DIAGNOSIS — D509 Iron deficiency anemia, unspecified: Secondary | ICD-10-CM | POA: Diagnosis not present

## 2017-05-18 DIAGNOSIS — E8779 Other fluid overload: Secondary | ICD-10-CM | POA: Diagnosis not present

## 2017-05-20 DIAGNOSIS — N186 End stage renal disease: Secondary | ICD-10-CM | POA: Diagnosis not present

## 2017-05-20 DIAGNOSIS — E8779 Other fluid overload: Secondary | ICD-10-CM | POA: Diagnosis not present

## 2017-05-20 DIAGNOSIS — N2581 Secondary hyperparathyroidism of renal origin: Secondary | ICD-10-CM | POA: Diagnosis not present

## 2017-05-20 DIAGNOSIS — D631 Anemia in chronic kidney disease: Secondary | ICD-10-CM | POA: Diagnosis not present

## 2017-05-20 DIAGNOSIS — D509 Iron deficiency anemia, unspecified: Secondary | ICD-10-CM | POA: Diagnosis not present

## 2017-05-20 DIAGNOSIS — E162 Hypoglycemia, unspecified: Secondary | ICD-10-CM | POA: Diagnosis not present

## 2017-05-23 DIAGNOSIS — N2581 Secondary hyperparathyroidism of renal origin: Secondary | ICD-10-CM | POA: Diagnosis not present

## 2017-05-23 DIAGNOSIS — D509 Iron deficiency anemia, unspecified: Secondary | ICD-10-CM | POA: Diagnosis not present

## 2017-05-23 DIAGNOSIS — E8779 Other fluid overload: Secondary | ICD-10-CM | POA: Diagnosis not present

## 2017-05-23 DIAGNOSIS — N186 End stage renal disease: Secondary | ICD-10-CM | POA: Diagnosis not present

## 2017-05-23 DIAGNOSIS — E162 Hypoglycemia, unspecified: Secondary | ICD-10-CM | POA: Diagnosis not present

## 2017-05-23 DIAGNOSIS — D631 Anemia in chronic kidney disease: Secondary | ICD-10-CM | POA: Diagnosis not present

## 2017-05-24 ENCOUNTER — Ambulatory Visit: Payer: Self-pay | Admitting: *Deleted

## 2017-05-25 DIAGNOSIS — E162 Hypoglycemia, unspecified: Secondary | ICD-10-CM | POA: Diagnosis not present

## 2017-05-25 DIAGNOSIS — N2581 Secondary hyperparathyroidism of renal origin: Secondary | ICD-10-CM | POA: Diagnosis not present

## 2017-05-25 DIAGNOSIS — E8779 Other fluid overload: Secondary | ICD-10-CM | POA: Diagnosis not present

## 2017-05-25 DIAGNOSIS — D631 Anemia in chronic kidney disease: Secondary | ICD-10-CM | POA: Diagnosis not present

## 2017-05-25 DIAGNOSIS — N186 End stage renal disease: Secondary | ICD-10-CM | POA: Diagnosis not present

## 2017-05-25 DIAGNOSIS — D509 Iron deficiency anemia, unspecified: Secondary | ICD-10-CM | POA: Diagnosis not present

## 2017-05-30 DIAGNOSIS — E8779 Other fluid overload: Secondary | ICD-10-CM | POA: Diagnosis not present

## 2017-05-30 DIAGNOSIS — D631 Anemia in chronic kidney disease: Secondary | ICD-10-CM | POA: Diagnosis not present

## 2017-05-30 DIAGNOSIS — N186 End stage renal disease: Secondary | ICD-10-CM | POA: Diagnosis not present

## 2017-05-30 DIAGNOSIS — D509 Iron deficiency anemia, unspecified: Secondary | ICD-10-CM | POA: Diagnosis not present

## 2017-05-30 DIAGNOSIS — N2581 Secondary hyperparathyroidism of renal origin: Secondary | ICD-10-CM | POA: Diagnosis not present

## 2017-05-30 DIAGNOSIS — E162 Hypoglycemia, unspecified: Secondary | ICD-10-CM | POA: Diagnosis not present

## 2017-06-01 DIAGNOSIS — N186 End stage renal disease: Secondary | ICD-10-CM | POA: Diagnosis not present

## 2017-06-01 DIAGNOSIS — N2581 Secondary hyperparathyroidism of renal origin: Secondary | ICD-10-CM | POA: Diagnosis not present

## 2017-06-01 DIAGNOSIS — E162 Hypoglycemia, unspecified: Secondary | ICD-10-CM | POA: Diagnosis not present

## 2017-06-01 DIAGNOSIS — D509 Iron deficiency anemia, unspecified: Secondary | ICD-10-CM | POA: Diagnosis not present

## 2017-06-01 DIAGNOSIS — E8779 Other fluid overload: Secondary | ICD-10-CM | POA: Diagnosis not present

## 2017-06-01 DIAGNOSIS — D631 Anemia in chronic kidney disease: Secondary | ICD-10-CM | POA: Diagnosis not present

## 2017-06-05 DIAGNOSIS — Z992 Dependence on renal dialysis: Secondary | ICD-10-CM | POA: Diagnosis not present

## 2017-06-05 DIAGNOSIS — E1122 Type 2 diabetes mellitus with diabetic chronic kidney disease: Secondary | ICD-10-CM | POA: Diagnosis not present

## 2017-06-05 DIAGNOSIS — N186 End stage renal disease: Secondary | ICD-10-CM | POA: Diagnosis not present

## 2017-06-06 DIAGNOSIS — N2581 Secondary hyperparathyroidism of renal origin: Secondary | ICD-10-CM | POA: Diagnosis not present

## 2017-06-06 DIAGNOSIS — D509 Iron deficiency anemia, unspecified: Secondary | ICD-10-CM | POA: Diagnosis not present

## 2017-06-06 DIAGNOSIS — N186 End stage renal disease: Secondary | ICD-10-CM | POA: Diagnosis not present

## 2017-06-06 DIAGNOSIS — E8779 Other fluid overload: Secondary | ICD-10-CM | POA: Diagnosis not present

## 2017-06-06 DIAGNOSIS — D631 Anemia in chronic kidney disease: Secondary | ICD-10-CM | POA: Diagnosis not present

## 2017-06-06 DIAGNOSIS — E162 Hypoglycemia, unspecified: Secondary | ICD-10-CM | POA: Diagnosis not present

## 2017-06-08 DIAGNOSIS — D631 Anemia in chronic kidney disease: Secondary | ICD-10-CM | POA: Diagnosis not present

## 2017-06-08 DIAGNOSIS — N186 End stage renal disease: Secondary | ICD-10-CM | POA: Diagnosis not present

## 2017-06-08 DIAGNOSIS — D509 Iron deficiency anemia, unspecified: Secondary | ICD-10-CM | POA: Diagnosis not present

## 2017-06-08 DIAGNOSIS — N2581 Secondary hyperparathyroidism of renal origin: Secondary | ICD-10-CM | POA: Diagnosis not present

## 2017-06-08 DIAGNOSIS — E162 Hypoglycemia, unspecified: Secondary | ICD-10-CM | POA: Diagnosis not present

## 2017-06-08 DIAGNOSIS — E8779 Other fluid overload: Secondary | ICD-10-CM | POA: Diagnosis not present

## 2017-06-13 DIAGNOSIS — E162 Hypoglycemia, unspecified: Secondary | ICD-10-CM | POA: Diagnosis not present

## 2017-06-13 DIAGNOSIS — D509 Iron deficiency anemia, unspecified: Secondary | ICD-10-CM | POA: Diagnosis not present

## 2017-06-13 DIAGNOSIS — N2581 Secondary hyperparathyroidism of renal origin: Secondary | ICD-10-CM | POA: Diagnosis not present

## 2017-06-13 DIAGNOSIS — N186 End stage renal disease: Secondary | ICD-10-CM | POA: Diagnosis not present

## 2017-06-13 DIAGNOSIS — E8779 Other fluid overload: Secondary | ICD-10-CM | POA: Diagnosis not present

## 2017-06-13 DIAGNOSIS — D631 Anemia in chronic kidney disease: Secondary | ICD-10-CM | POA: Diagnosis not present

## 2017-06-15 DIAGNOSIS — D509 Iron deficiency anemia, unspecified: Secondary | ICD-10-CM | POA: Diagnosis not present

## 2017-06-15 DIAGNOSIS — E8779 Other fluid overload: Secondary | ICD-10-CM | POA: Diagnosis not present

## 2017-06-15 DIAGNOSIS — D631 Anemia in chronic kidney disease: Secondary | ICD-10-CM | POA: Diagnosis not present

## 2017-06-15 DIAGNOSIS — N186 End stage renal disease: Secondary | ICD-10-CM | POA: Diagnosis not present

## 2017-06-15 DIAGNOSIS — N2581 Secondary hyperparathyroidism of renal origin: Secondary | ICD-10-CM | POA: Diagnosis not present

## 2017-06-15 DIAGNOSIS — E162 Hypoglycemia, unspecified: Secondary | ICD-10-CM | POA: Diagnosis not present

## 2017-06-20 DIAGNOSIS — D509 Iron deficiency anemia, unspecified: Secondary | ICD-10-CM | POA: Diagnosis not present

## 2017-06-20 DIAGNOSIS — N2581 Secondary hyperparathyroidism of renal origin: Secondary | ICD-10-CM | POA: Diagnosis not present

## 2017-06-20 DIAGNOSIS — E8779 Other fluid overload: Secondary | ICD-10-CM | POA: Diagnosis not present

## 2017-06-20 DIAGNOSIS — N186 End stage renal disease: Secondary | ICD-10-CM | POA: Diagnosis not present

## 2017-06-20 DIAGNOSIS — D631 Anemia in chronic kidney disease: Secondary | ICD-10-CM | POA: Diagnosis not present

## 2017-06-20 DIAGNOSIS — E162 Hypoglycemia, unspecified: Secondary | ICD-10-CM | POA: Diagnosis not present

## 2017-06-22 DIAGNOSIS — E162 Hypoglycemia, unspecified: Secondary | ICD-10-CM | POA: Diagnosis not present

## 2017-06-22 DIAGNOSIS — D631 Anemia in chronic kidney disease: Secondary | ICD-10-CM | POA: Diagnosis not present

## 2017-06-22 DIAGNOSIS — D509 Iron deficiency anemia, unspecified: Secondary | ICD-10-CM | POA: Diagnosis not present

## 2017-06-22 DIAGNOSIS — E8779 Other fluid overload: Secondary | ICD-10-CM | POA: Diagnosis not present

## 2017-06-22 DIAGNOSIS — Z992 Dependence on renal dialysis: Secondary | ICD-10-CM | POA: Diagnosis not present

## 2017-06-22 DIAGNOSIS — T82858A Stenosis of vascular prosthetic devices, implants and grafts, initial encounter: Secondary | ICD-10-CM | POA: Diagnosis not present

## 2017-06-22 DIAGNOSIS — T82868A Thrombosis of vascular prosthetic devices, implants and grafts, initial encounter: Secondary | ICD-10-CM | POA: Diagnosis not present

## 2017-06-22 DIAGNOSIS — N186 End stage renal disease: Secondary | ICD-10-CM | POA: Diagnosis not present

## 2017-06-22 DIAGNOSIS — N2581 Secondary hyperparathyroidism of renal origin: Secondary | ICD-10-CM | POA: Diagnosis not present

## 2017-06-27 DIAGNOSIS — D631 Anemia in chronic kidney disease: Secondary | ICD-10-CM | POA: Diagnosis not present

## 2017-06-27 DIAGNOSIS — N2581 Secondary hyperparathyroidism of renal origin: Secondary | ICD-10-CM | POA: Diagnosis not present

## 2017-06-27 DIAGNOSIS — E8779 Other fluid overload: Secondary | ICD-10-CM | POA: Diagnosis not present

## 2017-06-27 DIAGNOSIS — E162 Hypoglycemia, unspecified: Secondary | ICD-10-CM | POA: Diagnosis not present

## 2017-06-27 DIAGNOSIS — D509 Iron deficiency anemia, unspecified: Secondary | ICD-10-CM | POA: Diagnosis not present

## 2017-06-27 DIAGNOSIS — N186 End stage renal disease: Secondary | ICD-10-CM | POA: Diagnosis not present

## 2017-06-28 DIAGNOSIS — R030 Elevated blood-pressure reading, without diagnosis of hypertension: Secondary | ICD-10-CM | POA: Diagnosis not present

## 2017-06-29 DIAGNOSIS — D631 Anemia in chronic kidney disease: Secondary | ICD-10-CM | POA: Diagnosis not present

## 2017-06-29 DIAGNOSIS — D509 Iron deficiency anemia, unspecified: Secondary | ICD-10-CM | POA: Diagnosis not present

## 2017-06-29 DIAGNOSIS — E1129 Type 2 diabetes mellitus with other diabetic kidney complication: Secondary | ICD-10-CM | POA: Diagnosis not present

## 2017-06-29 DIAGNOSIS — N186 End stage renal disease: Secondary | ICD-10-CM | POA: Diagnosis not present

## 2017-06-29 DIAGNOSIS — E162 Hypoglycemia, unspecified: Secondary | ICD-10-CM | POA: Diagnosis not present

## 2017-06-29 DIAGNOSIS — E8779 Other fluid overload: Secondary | ICD-10-CM | POA: Diagnosis not present

## 2017-06-29 DIAGNOSIS — N2581 Secondary hyperparathyroidism of renal origin: Secondary | ICD-10-CM | POA: Diagnosis not present

## 2017-07-04 DIAGNOSIS — D631 Anemia in chronic kidney disease: Secondary | ICD-10-CM | POA: Diagnosis not present

## 2017-07-04 DIAGNOSIS — E162 Hypoglycemia, unspecified: Secondary | ICD-10-CM | POA: Diagnosis not present

## 2017-07-04 DIAGNOSIS — E8779 Other fluid overload: Secondary | ICD-10-CM | POA: Diagnosis not present

## 2017-07-04 DIAGNOSIS — D509 Iron deficiency anemia, unspecified: Secondary | ICD-10-CM | POA: Diagnosis not present

## 2017-07-04 DIAGNOSIS — N2581 Secondary hyperparathyroidism of renal origin: Secondary | ICD-10-CM | POA: Diagnosis not present

## 2017-07-04 DIAGNOSIS — N186 End stage renal disease: Secondary | ICD-10-CM | POA: Diagnosis not present

## 2017-07-05 DIAGNOSIS — N186 End stage renal disease: Secondary | ICD-10-CM | POA: Diagnosis not present

## 2017-07-05 DIAGNOSIS — E1122 Type 2 diabetes mellitus with diabetic chronic kidney disease: Secondary | ICD-10-CM | POA: Diagnosis not present

## 2017-07-05 DIAGNOSIS — Z992 Dependence on renal dialysis: Secondary | ICD-10-CM | POA: Diagnosis not present

## 2017-07-06 DIAGNOSIS — D631 Anemia in chronic kidney disease: Secondary | ICD-10-CM | POA: Diagnosis not present

## 2017-07-06 DIAGNOSIS — E8779 Other fluid overload: Secondary | ICD-10-CM | POA: Diagnosis not present

## 2017-07-06 DIAGNOSIS — N2581 Secondary hyperparathyroidism of renal origin: Secondary | ICD-10-CM | POA: Diagnosis not present

## 2017-07-06 DIAGNOSIS — N186 End stage renal disease: Secondary | ICD-10-CM | POA: Diagnosis not present

## 2017-07-06 DIAGNOSIS — E162 Hypoglycemia, unspecified: Secondary | ICD-10-CM | POA: Diagnosis not present

## 2017-07-11 DIAGNOSIS — N2581 Secondary hyperparathyroidism of renal origin: Secondary | ICD-10-CM | POA: Diagnosis not present

## 2017-07-11 DIAGNOSIS — E162 Hypoglycemia, unspecified: Secondary | ICD-10-CM | POA: Diagnosis not present

## 2017-07-11 DIAGNOSIS — N186 End stage renal disease: Secondary | ICD-10-CM | POA: Diagnosis not present

## 2017-07-11 DIAGNOSIS — D631 Anemia in chronic kidney disease: Secondary | ICD-10-CM | POA: Diagnosis not present

## 2017-07-11 DIAGNOSIS — E8779 Other fluid overload: Secondary | ICD-10-CM | POA: Diagnosis not present

## 2017-07-13 DIAGNOSIS — E8779 Other fluid overload: Secondary | ICD-10-CM | POA: Diagnosis not present

## 2017-07-13 DIAGNOSIS — E162 Hypoglycemia, unspecified: Secondary | ICD-10-CM | POA: Diagnosis not present

## 2017-07-13 DIAGNOSIS — N2581 Secondary hyperparathyroidism of renal origin: Secondary | ICD-10-CM | POA: Diagnosis not present

## 2017-07-13 DIAGNOSIS — N186 End stage renal disease: Secondary | ICD-10-CM | POA: Diagnosis not present

## 2017-07-13 DIAGNOSIS — D631 Anemia in chronic kidney disease: Secondary | ICD-10-CM | POA: Diagnosis not present

## 2017-07-18 DIAGNOSIS — N2581 Secondary hyperparathyroidism of renal origin: Secondary | ICD-10-CM | POA: Diagnosis not present

## 2017-07-18 DIAGNOSIS — E162 Hypoglycemia, unspecified: Secondary | ICD-10-CM | POA: Diagnosis not present

## 2017-07-18 DIAGNOSIS — D631 Anemia in chronic kidney disease: Secondary | ICD-10-CM | POA: Diagnosis not present

## 2017-07-18 DIAGNOSIS — E8779 Other fluid overload: Secondary | ICD-10-CM | POA: Diagnosis not present

## 2017-07-18 DIAGNOSIS — N186 End stage renal disease: Secondary | ICD-10-CM | POA: Diagnosis not present

## 2017-07-20 DIAGNOSIS — E8779 Other fluid overload: Secondary | ICD-10-CM | POA: Diagnosis not present

## 2017-07-20 DIAGNOSIS — E162 Hypoglycemia, unspecified: Secondary | ICD-10-CM | POA: Diagnosis not present

## 2017-07-20 DIAGNOSIS — D631 Anemia in chronic kidney disease: Secondary | ICD-10-CM | POA: Diagnosis not present

## 2017-07-20 DIAGNOSIS — N2581 Secondary hyperparathyroidism of renal origin: Secondary | ICD-10-CM | POA: Diagnosis not present

## 2017-07-20 DIAGNOSIS — N186 End stage renal disease: Secondary | ICD-10-CM | POA: Diagnosis not present

## 2017-07-25 DIAGNOSIS — D631 Anemia in chronic kidney disease: Secondary | ICD-10-CM | POA: Diagnosis not present

## 2017-07-25 DIAGNOSIS — E162 Hypoglycemia, unspecified: Secondary | ICD-10-CM | POA: Diagnosis not present

## 2017-07-25 DIAGNOSIS — E8779 Other fluid overload: Secondary | ICD-10-CM | POA: Diagnosis not present

## 2017-07-25 DIAGNOSIS — N2581 Secondary hyperparathyroidism of renal origin: Secondary | ICD-10-CM | POA: Diagnosis not present

## 2017-07-25 DIAGNOSIS — N186 End stage renal disease: Secondary | ICD-10-CM | POA: Diagnosis not present

## 2017-07-27 DIAGNOSIS — E162 Hypoglycemia, unspecified: Secondary | ICD-10-CM | POA: Diagnosis not present

## 2017-07-27 DIAGNOSIS — D631 Anemia in chronic kidney disease: Secondary | ICD-10-CM | POA: Diagnosis not present

## 2017-07-27 DIAGNOSIS — E8779 Other fluid overload: Secondary | ICD-10-CM | POA: Diagnosis not present

## 2017-07-27 DIAGNOSIS — N2581 Secondary hyperparathyroidism of renal origin: Secondary | ICD-10-CM | POA: Diagnosis not present

## 2017-07-27 DIAGNOSIS — N186 End stage renal disease: Secondary | ICD-10-CM | POA: Diagnosis not present

## 2017-08-01 DIAGNOSIS — N186 End stage renal disease: Secondary | ICD-10-CM | POA: Diagnosis not present

## 2017-08-01 DIAGNOSIS — N2581 Secondary hyperparathyroidism of renal origin: Secondary | ICD-10-CM | POA: Diagnosis not present

## 2017-08-01 DIAGNOSIS — D631 Anemia in chronic kidney disease: Secondary | ICD-10-CM | POA: Diagnosis not present

## 2017-08-01 DIAGNOSIS — E8779 Other fluid overload: Secondary | ICD-10-CM | POA: Diagnosis not present

## 2017-08-01 DIAGNOSIS — E162 Hypoglycemia, unspecified: Secondary | ICD-10-CM | POA: Diagnosis not present

## 2017-08-03 DIAGNOSIS — N2581 Secondary hyperparathyroidism of renal origin: Secondary | ICD-10-CM | POA: Diagnosis not present

## 2017-08-03 DIAGNOSIS — D631 Anemia in chronic kidney disease: Secondary | ICD-10-CM | POA: Diagnosis not present

## 2017-08-03 DIAGNOSIS — E8779 Other fluid overload: Secondary | ICD-10-CM | POA: Diagnosis not present

## 2017-08-03 DIAGNOSIS — N186 End stage renal disease: Secondary | ICD-10-CM | POA: Diagnosis not present

## 2017-08-03 DIAGNOSIS — E162 Hypoglycemia, unspecified: Secondary | ICD-10-CM | POA: Diagnosis not present

## 2017-08-05 DIAGNOSIS — Z992 Dependence on renal dialysis: Secondary | ICD-10-CM | POA: Diagnosis not present

## 2017-08-05 DIAGNOSIS — N186 End stage renal disease: Secondary | ICD-10-CM | POA: Diagnosis not present

## 2017-08-05 DIAGNOSIS — E1122 Type 2 diabetes mellitus with diabetic chronic kidney disease: Secondary | ICD-10-CM | POA: Diagnosis not present

## 2017-08-08 DIAGNOSIS — E162 Hypoglycemia, unspecified: Secondary | ICD-10-CM | POA: Diagnosis not present

## 2017-08-08 DIAGNOSIS — N2581 Secondary hyperparathyroidism of renal origin: Secondary | ICD-10-CM | POA: Diagnosis not present

## 2017-08-08 DIAGNOSIS — D631 Anemia in chronic kidney disease: Secondary | ICD-10-CM | POA: Diagnosis not present

## 2017-08-08 DIAGNOSIS — N186 End stage renal disease: Secondary | ICD-10-CM | POA: Diagnosis not present

## 2017-08-08 DIAGNOSIS — E8779 Other fluid overload: Secondary | ICD-10-CM | POA: Diagnosis not present

## 2017-08-10 DIAGNOSIS — E8779 Other fluid overload: Secondary | ICD-10-CM | POA: Diagnosis not present

## 2017-08-10 DIAGNOSIS — N186 End stage renal disease: Secondary | ICD-10-CM | POA: Diagnosis not present

## 2017-08-10 DIAGNOSIS — D631 Anemia in chronic kidney disease: Secondary | ICD-10-CM | POA: Diagnosis not present

## 2017-08-10 DIAGNOSIS — E162 Hypoglycemia, unspecified: Secondary | ICD-10-CM | POA: Diagnosis not present

## 2017-08-10 DIAGNOSIS — N2581 Secondary hyperparathyroidism of renal origin: Secondary | ICD-10-CM | POA: Diagnosis not present

## 2017-08-15 DIAGNOSIS — N2581 Secondary hyperparathyroidism of renal origin: Secondary | ICD-10-CM | POA: Diagnosis not present

## 2017-08-15 DIAGNOSIS — E8779 Other fluid overload: Secondary | ICD-10-CM | POA: Diagnosis not present

## 2017-08-15 DIAGNOSIS — D631 Anemia in chronic kidney disease: Secondary | ICD-10-CM | POA: Diagnosis not present

## 2017-08-15 DIAGNOSIS — N186 End stage renal disease: Secondary | ICD-10-CM | POA: Diagnosis not present

## 2017-08-15 DIAGNOSIS — E162 Hypoglycemia, unspecified: Secondary | ICD-10-CM | POA: Diagnosis not present

## 2017-08-17 DIAGNOSIS — E162 Hypoglycemia, unspecified: Secondary | ICD-10-CM | POA: Diagnosis not present

## 2017-08-17 DIAGNOSIS — E8779 Other fluid overload: Secondary | ICD-10-CM | POA: Diagnosis not present

## 2017-08-17 DIAGNOSIS — D631 Anemia in chronic kidney disease: Secondary | ICD-10-CM | POA: Diagnosis not present

## 2017-08-17 DIAGNOSIS — N2581 Secondary hyperparathyroidism of renal origin: Secondary | ICD-10-CM | POA: Diagnosis not present

## 2017-08-17 DIAGNOSIS — N186 End stage renal disease: Secondary | ICD-10-CM | POA: Diagnosis not present

## 2017-08-22 DIAGNOSIS — N186 End stage renal disease: Secondary | ICD-10-CM | POA: Diagnosis not present

## 2017-08-22 DIAGNOSIS — N2581 Secondary hyperparathyroidism of renal origin: Secondary | ICD-10-CM | POA: Diagnosis not present

## 2017-08-22 DIAGNOSIS — E162 Hypoglycemia, unspecified: Secondary | ICD-10-CM | POA: Diagnosis not present

## 2017-08-22 DIAGNOSIS — D631 Anemia in chronic kidney disease: Secondary | ICD-10-CM | POA: Diagnosis not present

## 2017-08-22 DIAGNOSIS — E8779 Other fluid overload: Secondary | ICD-10-CM | POA: Diagnosis not present

## 2017-08-24 DIAGNOSIS — N186 End stage renal disease: Secondary | ICD-10-CM | POA: Diagnosis not present

## 2017-08-24 DIAGNOSIS — N2581 Secondary hyperparathyroidism of renal origin: Secondary | ICD-10-CM | POA: Diagnosis not present

## 2017-08-24 DIAGNOSIS — E162 Hypoglycemia, unspecified: Secondary | ICD-10-CM | POA: Diagnosis not present

## 2017-08-24 DIAGNOSIS — D631 Anemia in chronic kidney disease: Secondary | ICD-10-CM | POA: Diagnosis not present

## 2017-08-24 DIAGNOSIS — E8779 Other fluid overload: Secondary | ICD-10-CM | POA: Diagnosis not present

## 2017-08-25 ENCOUNTER — Emergency Department (HOSPITAL_COMMUNITY)
Admission: EM | Admit: 2017-08-25 | Discharge: 2017-08-25 | Disposition: A | Discharge status: Home / Self Care | Payer: Medicare Other | Attending: Emergency Medicine | Admitting: Emergency Medicine

## 2017-08-25 ENCOUNTER — Emergency Department (HOSPITAL_COMMUNITY): Payer: Medicare Other

## 2017-08-25 ENCOUNTER — Encounter (HOSPITAL_COMMUNITY): Payer: Self-pay | Admitting: *Deleted

## 2017-08-25 DIAGNOSIS — W19XXXA Unspecified fall, initial encounter: Secondary | ICD-10-CM | POA: Insufficient documentation

## 2017-08-25 DIAGNOSIS — Z79899 Other long term (current) drug therapy: Secondary | ICD-10-CM | POA: Insufficient documentation

## 2017-08-25 DIAGNOSIS — I120 Hypertensive chronic kidney disease with stage 5 chronic kidney disease or end stage renal disease: Secondary | ICD-10-CM | POA: Insufficient documentation

## 2017-08-25 DIAGNOSIS — E161 Other hypoglycemia: Secondary | ICD-10-CM | POA: Diagnosis not present

## 2017-08-25 DIAGNOSIS — Y939 Activity, unspecified: Secondary | ICD-10-CM | POA: Diagnosis not present

## 2017-08-25 DIAGNOSIS — E1022 Type 1 diabetes mellitus with diabetic chronic kidney disease: Secondary | ICD-10-CM | POA: Diagnosis not present

## 2017-08-25 DIAGNOSIS — E162 Hypoglycemia, unspecified: Secondary | ICD-10-CM | POA: Diagnosis not present

## 2017-08-25 DIAGNOSIS — Y929 Unspecified place or not applicable: Secondary | ICD-10-CM | POA: Insufficient documentation

## 2017-08-25 DIAGNOSIS — S2231XA Fracture of one rib, right side, initial encounter for closed fracture: Secondary | ICD-10-CM | POA: Diagnosis not present

## 2017-08-25 DIAGNOSIS — N186 End stage renal disease: Secondary | ICD-10-CM | POA: Diagnosis not present

## 2017-08-25 DIAGNOSIS — Z992 Dependence on renal dialysis: Secondary | ICD-10-CM | POA: Diagnosis not present

## 2017-08-25 DIAGNOSIS — Y999 Unspecified external cause status: Secondary | ICD-10-CM | POA: Diagnosis not present

## 2017-08-25 DIAGNOSIS — S299XXA Unspecified injury of thorax, initial encounter: Secondary | ICD-10-CM | POA: Diagnosis not present

## 2017-08-25 DIAGNOSIS — R0781 Pleurodynia: Secondary | ICD-10-CM | POA: Diagnosis not present

## 2017-08-25 DIAGNOSIS — S20301A Unspecified superficial injuries of right front wall of thorax, initial encounter: Secondary | ICD-10-CM | POA: Diagnosis present

## 2017-08-25 MED ORDER — HYDROCODONE-ACETAMINOPHEN 5-325 MG PO TABS: 1.0000 | ORAL_TABLET | Freq: Four times a day (QID) | ORAL | 0 refills | Status: DC | PRN

## 2017-08-25 NOTE — ED Provider Notes (Signed)
[facility] DEPT Provider Note   CSN: [id number] Arrival date & time: 08/25/17  0708     History   Chief Complaint Chief Complaint  Patient presents with  . Rib Pain    right side    HPI Edward Colon is a 37 y.o. male.  History of end-stage renal disease and diabetes.  He states he had a hypoglycemic episode a couple of days ago in which he fell on being less responsive.  He was given glucose by EMS and did not come to the hospital to be evaluated.  Since then he has had right-sided chest wall pain in his axilla and a little posterior.  It is there constantly but will acutely get severe when he coughs sneezes or rolls over on that side.  He could not come yesterday because he had his dialysis but presented today because of worsening pain when he sneezed this morning.  He rates the pain is 10 out of 10 in these brief episodes.  He has been taking ibuprofen with minimal relief.  He does not feel short of breath there is been no cough no fevers no abdominal pain no other complaints.  The history is provided by the patient.  Chest Pain   This is a new problem. The current episode started 2 days ago. The problem occurs constantly. The problem has not changed since onset.The pain is associated with raising an arm and coughing. The pain is at a severity of 10/10. The quality of the pain is described as sharp and stabbing. The pain radiates to the epigastrium. The symptoms are aggravated by certain positions. Pertinent negatives include no abdominal pain, no cough, no diaphoresis, no fever, no headaches, no hemoptysis, no shortness of breath and no sputum production. Treatments tried: ibuprofen. The treatment provided mild relief. Risk factors include male gender.  His past medical history is significant for diabetes and hypertension.    Past Medical History:  Diagnosis Date  . Anemia   . Blind left eye since ~ 2010  . Depression   . Diabetic peripheral  neuropathy ([place])   . ESRD (end stage renal disease) on dialysis [facility])    "TTS; [facility]; [facility]" (02/01/2016)  . Heart murmur    denies any problems with it  . Hypertension   . Seizures ([place])    "last one was end of 2016; they are related to my diabetes" (02/01/2016)  . Type 1 diabetes ([place]) dx'd 1990    Patient Active Problem List   Diagnosis Date Noted  . Cardiac arrest ([place])   . DKA (diabetic ketoacidoses) ([place]) 03/05/2017  . Ventricular tachycardia ([place]) 03/05/2017  . Acute respiratory failure ([place])   . Involuntary commitment 01/29/2016  . Suicide attempt by substance overdose ([place]) 01/28/2016  . ESRD (end stage renal disease) ([place]) 03/07/2015  . Hyperglycemia 03/06/2015  . Acute on chronic renal failure ([place]) 02/27/2015  . URI (upper respiratory infection) 02/27/2015  . Acute-on-chronic kidney injury ([place]) 02/27/2015  . Rhabdomyolysis 08/31/2014  . Hypoglycemia 08/30/2014  . Type 1 diabetes ([place]) 08/30/2014  . CKD stage 2 due to type 2 diabetes mellitus ([place]) 08/30/2014  . Anemia 08/30/2014  . Swelling of arm 05/31/2013  . ARF (acute renal failure) ([place]) 05/31/2013  . Hyperkalemia 05/31/2013  . Seizure ([place]) 05/31/2013  . Seizures ([place])   . Hypertension     Past Surgical History:  Procedure Laterality Date  . AV FISTULA PLACEMENT Right 03/03/2015   Procedure: RIGHT RADIO-CEPHALIC ARTERIOVENOUS (AV)  FISTULA CREATION;  Surgeon: Serafina Mitchell, MD;  Location: [facility] OR;  Service: Vascular;  Laterality: Right;  . AV FISTULA PLACEMENT Left 06/15/2015   Procedure: INSERTION OF LEFT UPPER ARM  ARTERIOVENOUS (AV) 70mm x 50cm GORE-TEX GRAFT;  Surgeon: Elam Dutch, MD;  Location: [facility];  Service: Vascular;  Laterality: Left;  . BASCILIC VEIN TRANSPOSITION Left 04/01/2015   Procedure: BASCILIC VEIN TRANSPOSITION-LEFT ARM- FIRST STAGE;  Surgeon: Elam Dutch, MD;  Location: [facility];  Service: Vascular;  Laterality: Left;  . EYE SURGERY Right ~ 2010   for diabetic  retinopathy  . INSERTION OF DIALYSIS CATHETER Right 03/03/2015   Procedure: INSERTION OF DIALYSIS CATHETER;  Surgeon: Serafina Mitchell, MD;  Location: [facility] OR;  Service: Vascular;  Laterality: Right;        Home Medications    Prior to Admission medications   Medication Sig Start Date End Date Taking? Authorizing Provider  calcium acetate (PHOSLO) 667 MG capsule Take 1 capsule (667 mg total) by mouth 3 (three) times daily with meals. Patient taking differently: Take 1,334-2,668 mg by mouth See admin instructions. Take 4 capsules (2668 mg) by mouth with meals and 2 capsules (1334 mg) with snacks 03/07/15   [name], MD  FLUoxetine (PROZAC) 20 MG capsule Take 1 capsule (20 mg total) by mouth daily. Patient not taking: Reported on 03/05/2017 02/04/16   Domenic Polite, MD  glucose blood (ONETOUCH VERIO) test strip USE TO TEST BLOOD SUGAR 4 TIMES DAILY. DX CODE E10.22. 04/28/17   Renato Shin, MD  hydrOXYzine (ATARAX/VISTARIL) 25 MG tablet Take 1 tablet (25 mg total) by mouth at bedtime as needed and may repeat dose one time if needed for anxiety (insomnia.). Patient not taking: Reported on 03/05/2017 02/03/16   Domenic Polite, MD  Insulin Glargine (LANTUS SOLOSTAR) 100 UNIT/ML Solostar Pen Inject 20 Units into the skin daily at 10 pm. And pen needles 1/day 03/08/17   Reyne Dumas, MD  metoprolol tartrate (LOPRESSOR) 25 MG tablet Take 1 tablet (25 mg total) by mouth every morning. 03/08/17 03/08/18  Reyne Dumas, MD  multivitamin (RENA-VIT) TABS tablet Take 1 tablet by mouth at bedtime. Patient taking differently: Take 1 tablet by mouth daily.  03/07/15   [name], MD    Family History Family History  Problem Relation Age of Onset  . Diabetes Neg Hx     Social History Social History   Tobacco Use  . Smoking status: Never Smoker  . Smokeless tobacco: Never Used  Substance Use Topics  . Alcohol use: No    Frequency: Never    Comment: 02/01/2016 "might have 1 drink/year"    . Drug use: No     Allergies   Patient has no known allergies.   Review of Systems Review of Systems  Constitutional: Negative for diaphoresis and fever.  HENT: Negative for sore throat.   Eyes: Negative for visual disturbance.  Respiratory: Negative for cough, hemoptysis, sputum production and shortness of breath.   Cardiovascular: Positive for chest pain.  Gastrointestinal: Negative for abdominal pain.  Genitourinary: Negative for hematuria.  Musculoskeletal: Negative for neck pain.  Skin: Negative for rash.  Neurological: Negative for headaches.     Physical Exam Updated Vital Signs BP (!) 151/95 (BP Location: Right Arm)   Pulse 85   Resp 17   SpO2 100%   Physical Exam  Constitutional: He appears well-developed and well-nourished.  HENT:  Head: Normocephalic and atraumatic.  Eyes: Conjunctivae are normal.  Neck: Neck supple.  Cardiovascular: Normal rate, regular rhythm, normal heart sounds and intact distal pulses.  Pulmonary/Chest: Effort normal and breath sounds normal. He has no wheezes. He has no rales.     He exhibits tenderness.  Abdominal: Soft. He exhibits no mass. There is no tenderness. There is no guarding.  Musculoskeletal: Normal range of motion.  Neurological: He is alert. GCS eye subscore is 4. GCS verbal subscore is 5. GCS motor subscore is 6.  Skin: Skin is warm and dry.  Psychiatric: He has a normal mood and affect.  Nursing note and vitals reviewed.    ED Treatments / Results  Labs (all labs ordered are listed, but only abnormal results are displayed) Labs Reviewed - No data to display  EKG None  Radiology No results found.  DG Ribs Unilateral W/Chest Right  Final Result       Procedures Procedures (including critical care time)  Medications Ordered in ED Medications - No data to display   Initial Impression / Assessment and Plan / ED Course  I have reviewed the triage vital signs and the nursing notes.  Pertinent  labs & imaging results that were available during my care of the patient were reviewed by me and considered in my medical decision making (see chart for details).  Clinical Course as of Aug 27 1052  Fri Aug 26, 5363  6028 37 year old male here with right posterior lateral rib pain after a fall during hypoglycemic episode.  He looks very comfortable with a normal respiratory rate normal saturations.  On exam he does not have any obvious crepitus but is point tender.  He is getting a chest x-ray to make sure he does not have a pneumothorax and may also be able to identify a rib fracture as clinically he has one.   [MB]  [phone] Patient asking for a prescription for some pain medicine for help with the pain in the acute setting.  I counseled him regarding narcotic pain medicine and risks and he understands.  I reviewed the patient in the PMP and there are no prior narcotic scripts.   [MB]    Clinical Course User Index [MB] Hayden Rasmussen, MD    Final Clinical Impressions(s) / ED Diagnoses   Final diagnoses:  Closed fracture of one rib of right side, initial encounter    ED Discharge Orders        Ordered    HYDROcodone-acetaminophen (NORCO/VICODIN) 5-325 MG tablet  Every 6 hours PRN     08/25/17 0829       Hayden Rasmussen, MD 08/27/17 1055

## 2017-08-25 NOTE — ED Triage Notes (Signed)
Per EMS, pt complains of right sided rib pain, worse with inhalation. Pt fell 2 days ago d/t hypoglycemia, was evaluated by EMS, given glucose. Pt was not transported to hospital.   CBG 88

## 2017-08-25 NOTE — ED Notes (Signed)
Bed: KC00 Expected date:  Expected time:  Means of arrival:  Comments: EMS 37 yo male from home right sided rib pain-fell 2 days ago when hypoglycemic-pain last night

## 2017-08-25 NOTE — Discharge Instructions (Addendum)
Your evaluated in the emergency department for right-sided rib pain after a fall.  Your x-rays showed that you have 1/10 rib fracture but no signs of a punctured lung.  This will ultimately have to heal on its own and usually can take 4 to 6 weeks.  We are prescribing some pain medicine to take for the first couple days as this is usually the worst pain.  Please return if any worsening of shortness of breath.

## 2017-08-29 DIAGNOSIS — E162 Hypoglycemia, unspecified: Secondary | ICD-10-CM | POA: Diagnosis not present

## 2017-08-29 DIAGNOSIS — E8779 Other fluid overload: Secondary | ICD-10-CM | POA: Diagnosis not present

## 2017-08-29 DIAGNOSIS — N186 End stage renal disease: Secondary | ICD-10-CM | POA: Diagnosis not present

## 2017-08-29 DIAGNOSIS — D631 Anemia in chronic kidney disease: Secondary | ICD-10-CM | POA: Diagnosis not present

## 2017-08-29 DIAGNOSIS — N2581 Secondary hyperparathyroidism of renal origin: Secondary | ICD-10-CM | POA: Diagnosis not present

## 2017-08-31 DIAGNOSIS — N186 End stage renal disease: Secondary | ICD-10-CM | POA: Diagnosis not present

## 2017-08-31 DIAGNOSIS — E8779 Other fluid overload: Secondary | ICD-10-CM | POA: Diagnosis not present

## 2017-08-31 DIAGNOSIS — E162 Hypoglycemia, unspecified: Secondary | ICD-10-CM | POA: Diagnosis not present

## 2017-08-31 DIAGNOSIS — N2581 Secondary hyperparathyroidism of renal origin: Secondary | ICD-10-CM | POA: Diagnosis not present

## 2017-08-31 DIAGNOSIS — D631 Anemia in chronic kidney disease: Secondary | ICD-10-CM | POA: Diagnosis not present

## 2017-09-04 DIAGNOSIS — E1122 Type 2 diabetes mellitus with diabetic chronic kidney disease: Secondary | ICD-10-CM | POA: Diagnosis not present

## 2017-09-04 DIAGNOSIS — Z992 Dependence on renal dialysis: Secondary | ICD-10-CM | POA: Diagnosis not present

## 2017-09-04 DIAGNOSIS — N186 End stage renal disease: Secondary | ICD-10-CM | POA: Diagnosis not present

## 2017-09-05 DIAGNOSIS — N186 End stage renal disease: Secondary | ICD-10-CM | POA: Diagnosis not present

## 2017-09-05 DIAGNOSIS — N2581 Secondary hyperparathyroidism of renal origin: Secondary | ICD-10-CM | POA: Diagnosis not present

## 2017-09-05 DIAGNOSIS — D509 Iron deficiency anemia, unspecified: Secondary | ICD-10-CM | POA: Diagnosis not present

## 2017-09-05 DIAGNOSIS — E162 Hypoglycemia, unspecified: Secondary | ICD-10-CM | POA: Diagnosis not present

## 2017-09-05 DIAGNOSIS — D631 Anemia in chronic kidney disease: Secondary | ICD-10-CM | POA: Diagnosis not present

## 2017-09-05 DIAGNOSIS — E8779 Other fluid overload: Secondary | ICD-10-CM | POA: Diagnosis not present

## 2017-09-07 DIAGNOSIS — D509 Iron deficiency anemia, unspecified: Secondary | ICD-10-CM | POA: Diagnosis not present

## 2017-09-07 DIAGNOSIS — E162 Hypoglycemia, unspecified: Secondary | ICD-10-CM | POA: Diagnosis not present

## 2017-09-07 DIAGNOSIS — D631 Anemia in chronic kidney disease: Secondary | ICD-10-CM | POA: Diagnosis not present

## 2017-09-07 DIAGNOSIS — N2581 Secondary hyperparathyroidism of renal origin: Secondary | ICD-10-CM | POA: Diagnosis not present

## 2017-09-07 DIAGNOSIS — E8779 Other fluid overload: Secondary | ICD-10-CM | POA: Diagnosis not present

## 2017-09-07 DIAGNOSIS — N186 End stage renal disease: Secondary | ICD-10-CM | POA: Diagnosis not present

## 2017-09-09 DIAGNOSIS — N2581 Secondary hyperparathyroidism of renal origin: Secondary | ICD-10-CM | POA: Diagnosis not present

## 2017-09-09 DIAGNOSIS — D509 Iron deficiency anemia, unspecified: Secondary | ICD-10-CM | POA: Diagnosis not present

## 2017-09-09 DIAGNOSIS — N186 End stage renal disease: Secondary | ICD-10-CM | POA: Diagnosis not present

## 2017-09-09 DIAGNOSIS — E8779 Other fluid overload: Secondary | ICD-10-CM | POA: Diagnosis not present

## 2017-09-09 DIAGNOSIS — E162 Hypoglycemia, unspecified: Secondary | ICD-10-CM | POA: Diagnosis not present

## 2017-09-09 DIAGNOSIS — D631 Anemia in chronic kidney disease: Secondary | ICD-10-CM | POA: Diagnosis not present

## 2017-09-12 DIAGNOSIS — E8779 Other fluid overload: Secondary | ICD-10-CM | POA: Diagnosis not present

## 2017-09-12 DIAGNOSIS — D509 Iron deficiency anemia, unspecified: Secondary | ICD-10-CM | POA: Diagnosis not present

## 2017-09-12 DIAGNOSIS — D631 Anemia in chronic kidney disease: Secondary | ICD-10-CM | POA: Diagnosis not present

## 2017-09-12 DIAGNOSIS — E162 Hypoglycemia, unspecified: Secondary | ICD-10-CM | POA: Diagnosis not present

## 2017-09-12 DIAGNOSIS — N186 End stage renal disease: Secondary | ICD-10-CM | POA: Diagnosis not present

## 2017-09-12 DIAGNOSIS — N2581 Secondary hyperparathyroidism of renal origin: Secondary | ICD-10-CM | POA: Diagnosis not present

## 2017-09-14 DIAGNOSIS — N2581 Secondary hyperparathyroidism of renal origin: Secondary | ICD-10-CM | POA: Diagnosis not present

## 2017-09-14 DIAGNOSIS — N186 End stage renal disease: Secondary | ICD-10-CM | POA: Diagnosis not present

## 2017-09-14 DIAGNOSIS — D631 Anemia in chronic kidney disease: Secondary | ICD-10-CM | POA: Diagnosis not present

## 2017-09-14 DIAGNOSIS — E162 Hypoglycemia, unspecified: Secondary | ICD-10-CM | POA: Diagnosis not present

## 2017-09-14 DIAGNOSIS — D509 Iron deficiency anemia, unspecified: Secondary | ICD-10-CM | POA: Diagnosis not present

## 2017-09-14 DIAGNOSIS — E8779 Other fluid overload: Secondary | ICD-10-CM | POA: Diagnosis not present

## 2017-09-16 DIAGNOSIS — E8779 Other fluid overload: Secondary | ICD-10-CM | POA: Diagnosis not present

## 2017-09-16 DIAGNOSIS — D631 Anemia in chronic kidney disease: Secondary | ICD-10-CM | POA: Diagnosis not present

## 2017-09-16 DIAGNOSIS — D509 Iron deficiency anemia, unspecified: Secondary | ICD-10-CM | POA: Diagnosis not present

## 2017-09-16 DIAGNOSIS — N186 End stage renal disease: Secondary | ICD-10-CM | POA: Diagnosis not present

## 2017-09-16 DIAGNOSIS — N2581 Secondary hyperparathyroidism of renal origin: Secondary | ICD-10-CM | POA: Diagnosis not present

## 2017-09-16 DIAGNOSIS — E162 Hypoglycemia, unspecified: Secondary | ICD-10-CM | POA: Diagnosis not present

## 2017-09-19 DIAGNOSIS — D631 Anemia in chronic kidney disease: Secondary | ICD-10-CM | POA: Diagnosis not present

## 2017-09-19 DIAGNOSIS — N2581 Secondary hyperparathyroidism of renal origin: Secondary | ICD-10-CM | POA: Diagnosis not present

## 2017-09-19 DIAGNOSIS — D509 Iron deficiency anemia, unspecified: Secondary | ICD-10-CM | POA: Diagnosis not present

## 2017-09-19 DIAGNOSIS — E8779 Other fluid overload: Secondary | ICD-10-CM | POA: Diagnosis not present

## 2017-09-19 DIAGNOSIS — N186 End stage renal disease: Secondary | ICD-10-CM | POA: Diagnosis not present

## 2017-09-19 DIAGNOSIS — E162 Hypoglycemia, unspecified: Secondary | ICD-10-CM | POA: Diagnosis not present

## 2017-09-21 DIAGNOSIS — N2581 Secondary hyperparathyroidism of renal origin: Secondary | ICD-10-CM | POA: Diagnosis not present

## 2017-09-21 DIAGNOSIS — E8779 Other fluid overload: Secondary | ICD-10-CM | POA: Diagnosis not present

## 2017-09-21 DIAGNOSIS — E162 Hypoglycemia, unspecified: Secondary | ICD-10-CM | POA: Diagnosis not present

## 2017-09-21 DIAGNOSIS — N186 End stage renal disease: Secondary | ICD-10-CM | POA: Diagnosis not present

## 2017-09-21 DIAGNOSIS — D509 Iron deficiency anemia, unspecified: Secondary | ICD-10-CM | POA: Diagnosis not present

## 2017-09-21 DIAGNOSIS — D631 Anemia in chronic kidney disease: Secondary | ICD-10-CM | POA: Diagnosis not present

## 2017-09-23 DIAGNOSIS — N186 End stage renal disease: Secondary | ICD-10-CM | POA: Diagnosis not present

## 2017-09-23 DIAGNOSIS — D509 Iron deficiency anemia, unspecified: Secondary | ICD-10-CM | POA: Diagnosis not present

## 2017-09-23 DIAGNOSIS — E162 Hypoglycemia, unspecified: Secondary | ICD-10-CM | POA: Diagnosis not present

## 2017-09-23 DIAGNOSIS — D631 Anemia in chronic kidney disease: Secondary | ICD-10-CM | POA: Diagnosis not present

## 2017-09-23 DIAGNOSIS — E8779 Other fluid overload: Secondary | ICD-10-CM | POA: Diagnosis not present

## 2017-09-23 DIAGNOSIS — N2581 Secondary hyperparathyroidism of renal origin: Secondary | ICD-10-CM | POA: Diagnosis not present

## 2017-09-26 DIAGNOSIS — N2581 Secondary hyperparathyroidism of renal origin: Secondary | ICD-10-CM | POA: Diagnosis not present

## 2017-09-26 DIAGNOSIS — E162 Hypoglycemia, unspecified: Secondary | ICD-10-CM | POA: Diagnosis not present

## 2017-09-26 DIAGNOSIS — D509 Iron deficiency anemia, unspecified: Secondary | ICD-10-CM | POA: Diagnosis not present

## 2017-09-26 DIAGNOSIS — D631 Anemia in chronic kidney disease: Secondary | ICD-10-CM | POA: Diagnosis not present

## 2017-09-26 DIAGNOSIS — N186 End stage renal disease: Secondary | ICD-10-CM | POA: Diagnosis not present

## 2017-09-26 DIAGNOSIS — E8779 Other fluid overload: Secondary | ICD-10-CM | POA: Diagnosis not present

## 2017-09-28 DIAGNOSIS — D509 Iron deficiency anemia, unspecified: Secondary | ICD-10-CM | POA: Diagnosis not present

## 2017-09-28 DIAGNOSIS — D631 Anemia in chronic kidney disease: Secondary | ICD-10-CM | POA: Diagnosis not present

## 2017-09-28 DIAGNOSIS — E162 Hypoglycemia, unspecified: Secondary | ICD-10-CM | POA: Diagnosis not present

## 2017-09-28 DIAGNOSIS — E8779 Other fluid overload: Secondary | ICD-10-CM | POA: Diagnosis not present

## 2017-09-28 DIAGNOSIS — N186 End stage renal disease: Secondary | ICD-10-CM | POA: Diagnosis not present

## 2017-09-28 DIAGNOSIS — N2581 Secondary hyperparathyroidism of renal origin: Secondary | ICD-10-CM | POA: Diagnosis not present

## 2017-09-28 DIAGNOSIS — E1129 Type 2 diabetes mellitus with other diabetic kidney complication: Secondary | ICD-10-CM | POA: Diagnosis not present

## 2017-09-30 DIAGNOSIS — E8779 Other fluid overload: Secondary | ICD-10-CM | POA: Diagnosis not present

## 2017-09-30 DIAGNOSIS — N186 End stage renal disease: Secondary | ICD-10-CM | POA: Diagnosis not present

## 2017-09-30 DIAGNOSIS — D509 Iron deficiency anemia, unspecified: Secondary | ICD-10-CM | POA: Diagnosis not present

## 2017-09-30 DIAGNOSIS — E162 Hypoglycemia, unspecified: Secondary | ICD-10-CM | POA: Diagnosis not present

## 2017-09-30 DIAGNOSIS — N2581 Secondary hyperparathyroidism of renal origin: Secondary | ICD-10-CM | POA: Diagnosis not present

## 2017-09-30 DIAGNOSIS — D631 Anemia in chronic kidney disease: Secondary | ICD-10-CM | POA: Diagnosis not present

## 2017-10-03 DIAGNOSIS — N186 End stage renal disease: Secondary | ICD-10-CM | POA: Diagnosis not present

## 2017-10-03 DIAGNOSIS — D631 Anemia in chronic kidney disease: Secondary | ICD-10-CM | POA: Diagnosis not present

## 2017-10-03 DIAGNOSIS — E162 Hypoglycemia, unspecified: Secondary | ICD-10-CM | POA: Diagnosis not present

## 2017-10-03 DIAGNOSIS — D509 Iron deficiency anemia, unspecified: Secondary | ICD-10-CM | POA: Diagnosis not present

## 2017-10-03 DIAGNOSIS — N2581 Secondary hyperparathyroidism of renal origin: Secondary | ICD-10-CM | POA: Diagnosis not present

## 2017-10-03 DIAGNOSIS — E8779 Other fluid overload: Secondary | ICD-10-CM | POA: Diagnosis not present

## 2017-10-05 DIAGNOSIS — N186 End stage renal disease: Secondary | ICD-10-CM | POA: Diagnosis not present

## 2017-10-05 DIAGNOSIS — Z992 Dependence on renal dialysis: Secondary | ICD-10-CM | POA: Diagnosis not present

## 2017-10-05 DIAGNOSIS — N2581 Secondary hyperparathyroidism of renal origin: Secondary | ICD-10-CM | POA: Diagnosis not present

## 2017-10-05 DIAGNOSIS — E162 Hypoglycemia, unspecified: Secondary | ICD-10-CM | POA: Diagnosis not present

## 2017-10-05 DIAGNOSIS — E1122 Type 2 diabetes mellitus with diabetic chronic kidney disease: Secondary | ICD-10-CM | POA: Diagnosis not present

## 2017-10-05 DIAGNOSIS — E8779 Other fluid overload: Secondary | ICD-10-CM | POA: Diagnosis not present

## 2017-10-05 DIAGNOSIS — D631 Anemia in chronic kidney disease: Secondary | ICD-10-CM | POA: Diagnosis not present

## 2017-10-05 DIAGNOSIS — D509 Iron deficiency anemia, unspecified: Secondary | ICD-10-CM | POA: Diagnosis not present

## 2017-10-07 DIAGNOSIS — D509 Iron deficiency anemia, unspecified: Secondary | ICD-10-CM | POA: Diagnosis not present

## 2017-10-07 DIAGNOSIS — N186 End stage renal disease: Secondary | ICD-10-CM | POA: Diagnosis not present

## 2017-10-07 DIAGNOSIS — E8779 Other fluid overload: Secondary | ICD-10-CM | POA: Diagnosis not present

## 2017-10-07 DIAGNOSIS — E162 Hypoglycemia, unspecified: Secondary | ICD-10-CM | POA: Diagnosis not present

## 2017-10-07 DIAGNOSIS — N2581 Secondary hyperparathyroidism of renal origin: Secondary | ICD-10-CM | POA: Diagnosis not present

## 2017-10-07 DIAGNOSIS — D631 Anemia in chronic kidney disease: Secondary | ICD-10-CM | POA: Diagnosis not present

## 2017-10-10 DIAGNOSIS — D631 Anemia in chronic kidney disease: Secondary | ICD-10-CM | POA: Diagnosis not present

## 2017-10-10 DIAGNOSIS — N186 End stage renal disease: Secondary | ICD-10-CM | POA: Diagnosis not present

## 2017-10-10 DIAGNOSIS — D509 Iron deficiency anemia, unspecified: Secondary | ICD-10-CM | POA: Diagnosis not present

## 2017-10-10 DIAGNOSIS — E162 Hypoglycemia, unspecified: Secondary | ICD-10-CM | POA: Diagnosis not present

## 2017-10-10 DIAGNOSIS — N2581 Secondary hyperparathyroidism of renal origin: Secondary | ICD-10-CM | POA: Diagnosis not present

## 2017-10-10 DIAGNOSIS — E8779 Other fluid overload: Secondary | ICD-10-CM | POA: Diagnosis not present

## 2017-10-12 DIAGNOSIS — D631 Anemia in chronic kidney disease: Secondary | ICD-10-CM | POA: Diagnosis not present

## 2017-10-12 DIAGNOSIS — E162 Hypoglycemia, unspecified: Secondary | ICD-10-CM | POA: Diagnosis not present

## 2017-10-12 DIAGNOSIS — N2581 Secondary hyperparathyroidism of renal origin: Secondary | ICD-10-CM | POA: Diagnosis not present

## 2017-10-12 DIAGNOSIS — N186 End stage renal disease: Secondary | ICD-10-CM | POA: Diagnosis not present

## 2017-10-12 DIAGNOSIS — E8779 Other fluid overload: Secondary | ICD-10-CM | POA: Diagnosis not present

## 2017-10-12 DIAGNOSIS — D509 Iron deficiency anemia, unspecified: Secondary | ICD-10-CM | POA: Diagnosis not present

## 2017-10-14 DIAGNOSIS — D509 Iron deficiency anemia, unspecified: Secondary | ICD-10-CM | POA: Diagnosis not present

## 2017-10-14 DIAGNOSIS — E8779 Other fluid overload: Secondary | ICD-10-CM | POA: Diagnosis not present

## 2017-10-14 DIAGNOSIS — E162 Hypoglycemia, unspecified: Secondary | ICD-10-CM | POA: Diagnosis not present

## 2017-10-14 DIAGNOSIS — D631 Anemia in chronic kidney disease: Secondary | ICD-10-CM | POA: Diagnosis not present

## 2017-10-14 DIAGNOSIS — N186 End stage renal disease: Secondary | ICD-10-CM | POA: Diagnosis not present

## 2017-10-14 DIAGNOSIS — N2581 Secondary hyperparathyroidism of renal origin: Secondary | ICD-10-CM | POA: Diagnosis not present

## 2017-10-17 DIAGNOSIS — D631 Anemia in chronic kidney disease: Secondary | ICD-10-CM | POA: Diagnosis not present

## 2017-10-17 DIAGNOSIS — N2581 Secondary hyperparathyroidism of renal origin: Secondary | ICD-10-CM | POA: Diagnosis not present

## 2017-10-17 DIAGNOSIS — E162 Hypoglycemia, unspecified: Secondary | ICD-10-CM | POA: Diagnosis not present

## 2017-10-17 DIAGNOSIS — N186 End stage renal disease: Secondary | ICD-10-CM | POA: Diagnosis not present

## 2017-10-17 DIAGNOSIS — E8779 Other fluid overload: Secondary | ICD-10-CM | POA: Diagnosis not present

## 2017-10-17 DIAGNOSIS — D509 Iron deficiency anemia, unspecified: Secondary | ICD-10-CM | POA: Diagnosis not present

## 2017-10-19 DIAGNOSIS — N2581 Secondary hyperparathyroidism of renal origin: Secondary | ICD-10-CM | POA: Diagnosis not present

## 2017-10-19 DIAGNOSIS — D631 Anemia in chronic kidney disease: Secondary | ICD-10-CM | POA: Diagnosis not present

## 2017-10-19 DIAGNOSIS — E162 Hypoglycemia, unspecified: Secondary | ICD-10-CM | POA: Diagnosis not present

## 2017-10-19 DIAGNOSIS — N186 End stage renal disease: Secondary | ICD-10-CM | POA: Diagnosis not present

## 2017-10-19 DIAGNOSIS — E8779 Other fluid overload: Secondary | ICD-10-CM | POA: Diagnosis not present

## 2017-10-19 DIAGNOSIS — D509 Iron deficiency anemia, unspecified: Secondary | ICD-10-CM | POA: Diagnosis not present

## 2017-10-21 DIAGNOSIS — E162 Hypoglycemia, unspecified: Secondary | ICD-10-CM | POA: Diagnosis not present

## 2017-10-21 DIAGNOSIS — N186 End stage renal disease: Secondary | ICD-10-CM | POA: Diagnosis not present

## 2017-10-21 DIAGNOSIS — D631 Anemia in chronic kidney disease: Secondary | ICD-10-CM | POA: Diagnosis not present

## 2017-10-21 DIAGNOSIS — D509 Iron deficiency anemia, unspecified: Secondary | ICD-10-CM | POA: Diagnosis not present

## 2017-10-21 DIAGNOSIS — E8779 Other fluid overload: Secondary | ICD-10-CM | POA: Diagnosis not present

## 2017-10-21 DIAGNOSIS — N2581 Secondary hyperparathyroidism of renal origin: Secondary | ICD-10-CM | POA: Diagnosis not present

## 2017-10-24 DIAGNOSIS — E8779 Other fluid overload: Secondary | ICD-10-CM | POA: Diagnosis not present

## 2017-10-24 DIAGNOSIS — D509 Iron deficiency anemia, unspecified: Secondary | ICD-10-CM | POA: Diagnosis not present

## 2017-10-24 DIAGNOSIS — N186 End stage renal disease: Secondary | ICD-10-CM | POA: Diagnosis not present

## 2017-10-24 DIAGNOSIS — E162 Hypoglycemia, unspecified: Secondary | ICD-10-CM | POA: Diagnosis not present

## 2017-10-24 DIAGNOSIS — N2581 Secondary hyperparathyroidism of renal origin: Secondary | ICD-10-CM | POA: Diagnosis not present

## 2017-10-24 DIAGNOSIS — D631 Anemia in chronic kidney disease: Secondary | ICD-10-CM | POA: Diagnosis not present

## 2017-10-26 DIAGNOSIS — D631 Anemia in chronic kidney disease: Secondary | ICD-10-CM | POA: Diagnosis not present

## 2017-10-26 DIAGNOSIS — E162 Hypoglycemia, unspecified: Secondary | ICD-10-CM | POA: Diagnosis not present

## 2017-10-26 DIAGNOSIS — N186 End stage renal disease: Secondary | ICD-10-CM | POA: Diagnosis not present

## 2017-10-26 DIAGNOSIS — N2581 Secondary hyperparathyroidism of renal origin: Secondary | ICD-10-CM | POA: Diagnosis not present

## 2017-10-26 DIAGNOSIS — D509 Iron deficiency anemia, unspecified: Secondary | ICD-10-CM | POA: Diagnosis not present

## 2017-10-26 DIAGNOSIS — E8779 Other fluid overload: Secondary | ICD-10-CM | POA: Diagnosis not present

## 2017-10-28 DIAGNOSIS — N186 End stage renal disease: Secondary | ICD-10-CM | POA: Diagnosis not present

## 2017-10-28 DIAGNOSIS — N2581 Secondary hyperparathyroidism of renal origin: Secondary | ICD-10-CM | POA: Diagnosis not present

## 2017-10-28 DIAGNOSIS — D509 Iron deficiency anemia, unspecified: Secondary | ICD-10-CM | POA: Diagnosis not present

## 2017-10-28 DIAGNOSIS — E8779 Other fluid overload: Secondary | ICD-10-CM | POA: Diagnosis not present

## 2017-10-28 DIAGNOSIS — E162 Hypoglycemia, unspecified: Secondary | ICD-10-CM | POA: Diagnosis not present

## 2017-10-28 DIAGNOSIS — D631 Anemia in chronic kidney disease: Secondary | ICD-10-CM | POA: Diagnosis not present

## 2017-10-31 DIAGNOSIS — E162 Hypoglycemia, unspecified: Secondary | ICD-10-CM | POA: Diagnosis not present

## 2017-10-31 DIAGNOSIS — D631 Anemia in chronic kidney disease: Secondary | ICD-10-CM | POA: Diagnosis not present

## 2017-10-31 DIAGNOSIS — N186 End stage renal disease: Secondary | ICD-10-CM | POA: Diagnosis not present

## 2017-10-31 DIAGNOSIS — D509 Iron deficiency anemia, unspecified: Secondary | ICD-10-CM | POA: Diagnosis not present

## 2017-10-31 DIAGNOSIS — N2581 Secondary hyperparathyroidism of renal origin: Secondary | ICD-10-CM | POA: Diagnosis not present

## 2017-10-31 DIAGNOSIS — E8779 Other fluid overload: Secondary | ICD-10-CM | POA: Diagnosis not present

## 2017-11-02 DIAGNOSIS — E8779 Other fluid overload: Secondary | ICD-10-CM | POA: Diagnosis not present

## 2017-11-02 DIAGNOSIS — D509 Iron deficiency anemia, unspecified: Secondary | ICD-10-CM | POA: Diagnosis not present

## 2017-11-02 DIAGNOSIS — N186 End stage renal disease: Secondary | ICD-10-CM | POA: Diagnosis not present

## 2017-11-02 DIAGNOSIS — N2581 Secondary hyperparathyroidism of renal origin: Secondary | ICD-10-CM | POA: Diagnosis not present

## 2017-11-02 DIAGNOSIS — E162 Hypoglycemia, unspecified: Secondary | ICD-10-CM | POA: Diagnosis not present

## 2017-11-02 DIAGNOSIS — D631 Anemia in chronic kidney disease: Secondary | ICD-10-CM | POA: Diagnosis not present

## 2017-11-04 DIAGNOSIS — D509 Iron deficiency anemia, unspecified: Secondary | ICD-10-CM | POA: Diagnosis not present

## 2017-11-04 DIAGNOSIS — N186 End stage renal disease: Secondary | ICD-10-CM | POA: Diagnosis not present

## 2017-11-04 DIAGNOSIS — N2581 Secondary hyperparathyroidism of renal origin: Secondary | ICD-10-CM | POA: Diagnosis not present

## 2017-11-04 DIAGNOSIS — E162 Hypoglycemia, unspecified: Secondary | ICD-10-CM | POA: Diagnosis not present

## 2017-11-04 DIAGNOSIS — E8779 Other fluid overload: Secondary | ICD-10-CM | POA: Diagnosis not present

## 2017-11-04 DIAGNOSIS — D631 Anemia in chronic kidney disease: Secondary | ICD-10-CM | POA: Diagnosis not present

## 2017-11-05 DIAGNOSIS — Z992 Dependence on renal dialysis: Secondary | ICD-10-CM | POA: Diagnosis not present

## 2017-11-05 DIAGNOSIS — N186 End stage renal disease: Secondary | ICD-10-CM | POA: Diagnosis not present

## 2017-11-05 DIAGNOSIS — E1122 Type 2 diabetes mellitus with diabetic chronic kidney disease: Secondary | ICD-10-CM | POA: Diagnosis not present

## 2017-11-07 DIAGNOSIS — D509 Iron deficiency anemia, unspecified: Secondary | ICD-10-CM | POA: Diagnosis not present

## 2017-11-07 DIAGNOSIS — N2581 Secondary hyperparathyroidism of renal origin: Secondary | ICD-10-CM | POA: Diagnosis not present

## 2017-11-07 DIAGNOSIS — E162 Hypoglycemia, unspecified: Secondary | ICD-10-CM | POA: Diagnosis not present

## 2017-11-07 DIAGNOSIS — Z23 Encounter for immunization: Secondary | ICD-10-CM | POA: Diagnosis not present

## 2017-11-07 DIAGNOSIS — N186 End stage renal disease: Secondary | ICD-10-CM | POA: Diagnosis not present

## 2017-11-07 DIAGNOSIS — E8779 Other fluid overload: Secondary | ICD-10-CM | POA: Diagnosis not present

## 2017-11-07 DIAGNOSIS — D631 Anemia in chronic kidney disease: Secondary | ICD-10-CM | POA: Diagnosis not present

## 2017-11-09 DIAGNOSIS — Z23 Encounter for immunization: Secondary | ICD-10-CM | POA: Diagnosis not present

## 2017-11-09 DIAGNOSIS — N2581 Secondary hyperparathyroidism of renal origin: Secondary | ICD-10-CM | POA: Diagnosis not present

## 2017-11-09 DIAGNOSIS — D509 Iron deficiency anemia, unspecified: Secondary | ICD-10-CM | POA: Diagnosis not present

## 2017-11-09 DIAGNOSIS — E162 Hypoglycemia, unspecified: Secondary | ICD-10-CM | POA: Diagnosis not present

## 2017-11-09 DIAGNOSIS — N186 End stage renal disease: Secondary | ICD-10-CM | POA: Diagnosis not present

## 2017-11-09 DIAGNOSIS — D631 Anemia in chronic kidney disease: Secondary | ICD-10-CM | POA: Diagnosis not present

## 2017-11-11 DIAGNOSIS — N186 End stage renal disease: Secondary | ICD-10-CM | POA: Diagnosis not present

## 2017-11-11 DIAGNOSIS — Z23 Encounter for immunization: Secondary | ICD-10-CM | POA: Diagnosis not present

## 2017-11-11 DIAGNOSIS — E162 Hypoglycemia, unspecified: Secondary | ICD-10-CM | POA: Diagnosis not present

## 2017-11-11 DIAGNOSIS — D509 Iron deficiency anemia, unspecified: Secondary | ICD-10-CM | POA: Diagnosis not present

## 2017-11-11 DIAGNOSIS — N2581 Secondary hyperparathyroidism of renal origin: Secondary | ICD-10-CM | POA: Diagnosis not present

## 2017-11-11 DIAGNOSIS — D631 Anemia in chronic kidney disease: Secondary | ICD-10-CM | POA: Diagnosis not present

## 2017-11-14 DIAGNOSIS — N186 End stage renal disease: Secondary | ICD-10-CM | POA: Diagnosis not present

## 2017-11-14 DIAGNOSIS — E162 Hypoglycemia, unspecified: Secondary | ICD-10-CM | POA: Diagnosis not present

## 2017-11-14 DIAGNOSIS — N2581 Secondary hyperparathyroidism of renal origin: Secondary | ICD-10-CM | POA: Diagnosis not present

## 2017-11-14 DIAGNOSIS — D509 Iron deficiency anemia, unspecified: Secondary | ICD-10-CM | POA: Diagnosis not present

## 2017-11-14 DIAGNOSIS — D631 Anemia in chronic kidney disease: Secondary | ICD-10-CM | POA: Diagnosis not present

## 2017-11-14 DIAGNOSIS — Z23 Encounter for immunization: Secondary | ICD-10-CM | POA: Diagnosis not present

## 2017-11-16 DIAGNOSIS — Z23 Encounter for immunization: Secondary | ICD-10-CM | POA: Diagnosis not present

## 2017-11-16 DIAGNOSIS — N186 End stage renal disease: Secondary | ICD-10-CM | POA: Diagnosis not present

## 2017-11-16 DIAGNOSIS — E162 Hypoglycemia, unspecified: Secondary | ICD-10-CM | POA: Diagnosis not present

## 2017-11-16 DIAGNOSIS — N2581 Secondary hyperparathyroidism of renal origin: Secondary | ICD-10-CM | POA: Diagnosis not present

## 2017-11-16 DIAGNOSIS — D509 Iron deficiency anemia, unspecified: Secondary | ICD-10-CM | POA: Diagnosis not present

## 2017-11-16 DIAGNOSIS — D631 Anemia in chronic kidney disease: Secondary | ICD-10-CM | POA: Diagnosis not present

## 2017-11-18 DIAGNOSIS — D509 Iron deficiency anemia, unspecified: Secondary | ICD-10-CM | POA: Diagnosis not present

## 2017-11-18 DIAGNOSIS — E162 Hypoglycemia, unspecified: Secondary | ICD-10-CM | POA: Diagnosis not present

## 2017-11-18 DIAGNOSIS — N186 End stage renal disease: Secondary | ICD-10-CM | POA: Diagnosis not present

## 2017-11-18 DIAGNOSIS — N2581 Secondary hyperparathyroidism of renal origin: Secondary | ICD-10-CM | POA: Diagnosis not present

## 2017-11-18 DIAGNOSIS — D631 Anemia in chronic kidney disease: Secondary | ICD-10-CM | POA: Diagnosis not present

## 2017-11-18 DIAGNOSIS — Z23 Encounter for immunization: Secondary | ICD-10-CM | POA: Diagnosis not present

## 2017-11-21 DIAGNOSIS — N2581 Secondary hyperparathyroidism of renal origin: Secondary | ICD-10-CM | POA: Diagnosis not present

## 2017-11-21 DIAGNOSIS — D509 Iron deficiency anemia, unspecified: Secondary | ICD-10-CM | POA: Diagnosis not present

## 2017-11-21 DIAGNOSIS — D631 Anemia in chronic kidney disease: Secondary | ICD-10-CM | POA: Diagnosis not present

## 2017-11-21 DIAGNOSIS — Z23 Encounter for immunization: Secondary | ICD-10-CM | POA: Diagnosis not present

## 2017-11-21 DIAGNOSIS — E162 Hypoglycemia, unspecified: Secondary | ICD-10-CM | POA: Diagnosis not present

## 2017-11-21 DIAGNOSIS — N186 End stage renal disease: Secondary | ICD-10-CM | POA: Diagnosis not present

## 2017-11-23 DIAGNOSIS — E162 Hypoglycemia, unspecified: Secondary | ICD-10-CM | POA: Diagnosis not present

## 2017-11-23 DIAGNOSIS — D631 Anemia in chronic kidney disease: Secondary | ICD-10-CM | POA: Diagnosis not present

## 2017-11-23 DIAGNOSIS — N186 End stage renal disease: Secondary | ICD-10-CM | POA: Diagnosis not present

## 2017-11-23 DIAGNOSIS — Z23 Encounter for immunization: Secondary | ICD-10-CM | POA: Diagnosis not present

## 2017-11-23 DIAGNOSIS — D509 Iron deficiency anemia, unspecified: Secondary | ICD-10-CM | POA: Diagnosis not present

## 2017-11-23 DIAGNOSIS — N2581 Secondary hyperparathyroidism of renal origin: Secondary | ICD-10-CM | POA: Diagnosis not present

## 2017-11-28 DIAGNOSIS — N2581 Secondary hyperparathyroidism of renal origin: Secondary | ICD-10-CM | POA: Diagnosis not present

## 2017-11-28 DIAGNOSIS — D509 Iron deficiency anemia, unspecified: Secondary | ICD-10-CM | POA: Diagnosis not present

## 2017-11-28 DIAGNOSIS — D631 Anemia in chronic kidney disease: Secondary | ICD-10-CM | POA: Diagnosis not present

## 2017-11-28 DIAGNOSIS — E162 Hypoglycemia, unspecified: Secondary | ICD-10-CM | POA: Diagnosis not present

## 2017-11-28 DIAGNOSIS — N186 End stage renal disease: Secondary | ICD-10-CM | POA: Diagnosis not present

## 2017-11-28 DIAGNOSIS — Z23 Encounter for immunization: Secondary | ICD-10-CM | POA: Diagnosis not present

## 2017-11-30 DIAGNOSIS — D631 Anemia in chronic kidney disease: Secondary | ICD-10-CM | POA: Diagnosis not present

## 2017-11-30 DIAGNOSIS — Z23 Encounter for immunization: Secondary | ICD-10-CM | POA: Diagnosis not present

## 2017-11-30 DIAGNOSIS — E162 Hypoglycemia, unspecified: Secondary | ICD-10-CM | POA: Diagnosis not present

## 2017-11-30 DIAGNOSIS — N186 End stage renal disease: Secondary | ICD-10-CM | POA: Diagnosis not present

## 2017-11-30 DIAGNOSIS — D509 Iron deficiency anemia, unspecified: Secondary | ICD-10-CM | POA: Diagnosis not present

## 2017-11-30 DIAGNOSIS — N2581 Secondary hyperparathyroidism of renal origin: Secondary | ICD-10-CM | POA: Diagnosis not present

## 2017-12-05 DIAGNOSIS — E8779 Other fluid overload: Secondary | ICD-10-CM | POA: Diagnosis not present

## 2017-12-05 DIAGNOSIS — N186 End stage renal disease: Secondary | ICD-10-CM | POA: Diagnosis not present

## 2017-12-05 DIAGNOSIS — Z992 Dependence on renal dialysis: Secondary | ICD-10-CM | POA: Diagnosis not present

## 2017-12-05 DIAGNOSIS — E162 Hypoglycemia, unspecified: Secondary | ICD-10-CM | POA: Diagnosis not present

## 2017-12-05 DIAGNOSIS — E1122 Type 2 diabetes mellitus with diabetic chronic kidney disease: Secondary | ICD-10-CM | POA: Diagnosis not present

## 2017-12-05 DIAGNOSIS — N2581 Secondary hyperparathyroidism of renal origin: Secondary | ICD-10-CM | POA: Diagnosis not present

## 2017-12-05 DIAGNOSIS — D631 Anemia in chronic kidney disease: Secondary | ICD-10-CM | POA: Diagnosis not present

## 2017-12-07 DIAGNOSIS — N2581 Secondary hyperparathyroidism of renal origin: Secondary | ICD-10-CM | POA: Diagnosis not present

## 2017-12-07 DIAGNOSIS — D631 Anemia in chronic kidney disease: Secondary | ICD-10-CM | POA: Diagnosis not present

## 2017-12-07 DIAGNOSIS — E162 Hypoglycemia, unspecified: Secondary | ICD-10-CM | POA: Diagnosis not present

## 2017-12-07 DIAGNOSIS — N186 End stage renal disease: Secondary | ICD-10-CM | POA: Diagnosis not present

## 2017-12-07 DIAGNOSIS — E8779 Other fluid overload: Secondary | ICD-10-CM | POA: Diagnosis not present

## 2017-12-09 DIAGNOSIS — D631 Anemia in chronic kidney disease: Secondary | ICD-10-CM | POA: Diagnosis not present

## 2017-12-09 DIAGNOSIS — N186 End stage renal disease: Secondary | ICD-10-CM | POA: Diagnosis not present

## 2017-12-09 DIAGNOSIS — N2581 Secondary hyperparathyroidism of renal origin: Secondary | ICD-10-CM | POA: Diagnosis not present

## 2017-12-09 DIAGNOSIS — E8779 Other fluid overload: Secondary | ICD-10-CM | POA: Diagnosis not present

## 2017-12-09 DIAGNOSIS — E162 Hypoglycemia, unspecified: Secondary | ICD-10-CM | POA: Diagnosis not present

## 2017-12-12 DIAGNOSIS — N2581 Secondary hyperparathyroidism of renal origin: Secondary | ICD-10-CM | POA: Diagnosis not present

## 2017-12-12 DIAGNOSIS — E8779 Other fluid overload: Secondary | ICD-10-CM | POA: Diagnosis not present

## 2017-12-12 DIAGNOSIS — E162 Hypoglycemia, unspecified: Secondary | ICD-10-CM | POA: Diagnosis not present

## 2017-12-12 DIAGNOSIS — N186 End stage renal disease: Secondary | ICD-10-CM | POA: Diagnosis not present

## 2017-12-12 DIAGNOSIS — D631 Anemia in chronic kidney disease: Secondary | ICD-10-CM | POA: Diagnosis not present

## 2017-12-14 DIAGNOSIS — N186 End stage renal disease: Secondary | ICD-10-CM | POA: Diagnosis not present

## 2017-12-14 DIAGNOSIS — E8779 Other fluid overload: Secondary | ICD-10-CM | POA: Diagnosis not present

## 2017-12-14 DIAGNOSIS — N2581 Secondary hyperparathyroidism of renal origin: Secondary | ICD-10-CM | POA: Diagnosis not present

## 2017-12-14 DIAGNOSIS — E162 Hypoglycemia, unspecified: Secondary | ICD-10-CM | POA: Diagnosis not present

## 2017-12-14 DIAGNOSIS — D631 Anemia in chronic kidney disease: Secondary | ICD-10-CM | POA: Diagnosis not present

## 2017-12-16 DIAGNOSIS — E162 Hypoglycemia, unspecified: Secondary | ICD-10-CM | POA: Diagnosis not present

## 2017-12-16 DIAGNOSIS — N186 End stage renal disease: Secondary | ICD-10-CM | POA: Diagnosis not present

## 2017-12-16 DIAGNOSIS — N2581 Secondary hyperparathyroidism of renal origin: Secondary | ICD-10-CM | POA: Diagnosis not present

## 2017-12-16 DIAGNOSIS — E8779 Other fluid overload: Secondary | ICD-10-CM | POA: Diagnosis not present

## 2017-12-16 DIAGNOSIS — D631 Anemia in chronic kidney disease: Secondary | ICD-10-CM | POA: Diagnosis not present

## 2017-12-19 DIAGNOSIS — N186 End stage renal disease: Secondary | ICD-10-CM | POA: Diagnosis not present

## 2017-12-19 DIAGNOSIS — E8779 Other fluid overload: Secondary | ICD-10-CM | POA: Diagnosis not present

## 2017-12-19 DIAGNOSIS — D631 Anemia in chronic kidney disease: Secondary | ICD-10-CM | POA: Diagnosis not present

## 2017-12-19 DIAGNOSIS — E162 Hypoglycemia, unspecified: Secondary | ICD-10-CM | POA: Diagnosis not present

## 2017-12-19 DIAGNOSIS — N2581 Secondary hyperparathyroidism of renal origin: Secondary | ICD-10-CM | POA: Diagnosis not present

## 2017-12-21 DIAGNOSIS — N186 End stage renal disease: Secondary | ICD-10-CM | POA: Diagnosis not present

## 2017-12-21 DIAGNOSIS — E1129 Type 2 diabetes mellitus with other diabetic kidney complication: Secondary | ICD-10-CM | POA: Diagnosis not present

## 2017-12-21 DIAGNOSIS — E8779 Other fluid overload: Secondary | ICD-10-CM | POA: Diagnosis not present

## 2017-12-21 DIAGNOSIS — E162 Hypoglycemia, unspecified: Secondary | ICD-10-CM | POA: Diagnosis not present

## 2017-12-21 DIAGNOSIS — N2581 Secondary hyperparathyroidism of renal origin: Secondary | ICD-10-CM | POA: Diagnosis not present

## 2017-12-21 DIAGNOSIS — D631 Anemia in chronic kidney disease: Secondary | ICD-10-CM | POA: Diagnosis not present

## 2017-12-23 DIAGNOSIS — N186 End stage renal disease: Secondary | ICD-10-CM | POA: Diagnosis not present

## 2017-12-23 DIAGNOSIS — D631 Anemia in chronic kidney disease: Secondary | ICD-10-CM | POA: Diagnosis not present

## 2017-12-23 DIAGNOSIS — E162 Hypoglycemia, unspecified: Secondary | ICD-10-CM | POA: Diagnosis not present

## 2017-12-23 DIAGNOSIS — E8779 Other fluid overload: Secondary | ICD-10-CM | POA: Diagnosis not present

## 2017-12-23 DIAGNOSIS — N2581 Secondary hyperparathyroidism of renal origin: Secondary | ICD-10-CM | POA: Diagnosis not present

## 2017-12-28 DIAGNOSIS — N2581 Secondary hyperparathyroidism of renal origin: Secondary | ICD-10-CM | POA: Diagnosis not present

## 2017-12-28 DIAGNOSIS — E8779 Other fluid overload: Secondary | ICD-10-CM | POA: Diagnosis not present

## 2017-12-28 DIAGNOSIS — D631 Anemia in chronic kidney disease: Secondary | ICD-10-CM | POA: Diagnosis not present

## 2017-12-28 DIAGNOSIS — E162 Hypoglycemia, unspecified: Secondary | ICD-10-CM | POA: Diagnosis not present

## 2017-12-28 DIAGNOSIS — N186 End stage renal disease: Secondary | ICD-10-CM | POA: Diagnosis not present

## 2018-01-02 DIAGNOSIS — N2581 Secondary hyperparathyroidism of renal origin: Secondary | ICD-10-CM | POA: Diagnosis not present

## 2018-01-02 DIAGNOSIS — D631 Anemia in chronic kidney disease: Secondary | ICD-10-CM | POA: Diagnosis not present

## 2018-01-02 DIAGNOSIS — E162 Hypoglycemia, unspecified: Secondary | ICD-10-CM | POA: Diagnosis not present

## 2018-01-02 DIAGNOSIS — E8779 Other fluid overload: Secondary | ICD-10-CM | POA: Diagnosis not present

## 2018-01-02 DIAGNOSIS — N186 End stage renal disease: Secondary | ICD-10-CM | POA: Diagnosis not present

## 2018-01-04 DIAGNOSIS — E8779 Other fluid overload: Secondary | ICD-10-CM | POA: Diagnosis not present

## 2018-01-04 DIAGNOSIS — N186 End stage renal disease: Secondary | ICD-10-CM | POA: Diagnosis not present

## 2018-01-04 DIAGNOSIS — N2581 Secondary hyperparathyroidism of renal origin: Secondary | ICD-10-CM | POA: Diagnosis not present

## 2018-01-04 DIAGNOSIS — D631 Anemia in chronic kidney disease: Secondary | ICD-10-CM | POA: Diagnosis not present

## 2018-01-04 DIAGNOSIS — E162 Hypoglycemia, unspecified: Secondary | ICD-10-CM | POA: Diagnosis not present

## 2018-01-05 DIAGNOSIS — E1122 Type 2 diabetes mellitus with diabetic chronic kidney disease: Secondary | ICD-10-CM | POA: Diagnosis not present

## 2018-01-05 DIAGNOSIS — N186 End stage renal disease: Secondary | ICD-10-CM | POA: Diagnosis not present

## 2018-01-05 DIAGNOSIS — Z992 Dependence on renal dialysis: Secondary | ICD-10-CM | POA: Diagnosis not present

## 2018-01-09 DIAGNOSIS — N2581 Secondary hyperparathyroidism of renal origin: Secondary | ICD-10-CM | POA: Diagnosis not present

## 2018-01-09 DIAGNOSIS — N186 End stage renal disease: Secondary | ICD-10-CM | POA: Diagnosis not present

## 2018-01-09 DIAGNOSIS — D509 Iron deficiency anemia, unspecified: Secondary | ICD-10-CM | POA: Diagnosis not present

## 2018-01-09 DIAGNOSIS — E8779 Other fluid overload: Secondary | ICD-10-CM | POA: Diagnosis not present

## 2018-01-11 DIAGNOSIS — E8779 Other fluid overload: Secondary | ICD-10-CM | POA: Diagnosis not present

## 2018-01-11 DIAGNOSIS — N186 End stage renal disease: Secondary | ICD-10-CM | POA: Diagnosis not present

## 2018-01-11 DIAGNOSIS — D509 Iron deficiency anemia, unspecified: Secondary | ICD-10-CM | POA: Diagnosis not present

## 2018-01-11 DIAGNOSIS — N2581 Secondary hyperparathyroidism of renal origin: Secondary | ICD-10-CM | POA: Diagnosis not present

## 2018-01-13 DIAGNOSIS — E8779 Other fluid overload: Secondary | ICD-10-CM | POA: Diagnosis not present

## 2018-01-13 DIAGNOSIS — N186 End stage renal disease: Secondary | ICD-10-CM | POA: Diagnosis not present

## 2018-01-13 DIAGNOSIS — N2581 Secondary hyperparathyroidism of renal origin: Secondary | ICD-10-CM | POA: Diagnosis not present

## 2018-01-13 DIAGNOSIS — D509 Iron deficiency anemia, unspecified: Secondary | ICD-10-CM | POA: Diagnosis not present

## 2018-01-16 DIAGNOSIS — N186 End stage renal disease: Secondary | ICD-10-CM | POA: Diagnosis not present

## 2018-01-16 DIAGNOSIS — N2581 Secondary hyperparathyroidism of renal origin: Secondary | ICD-10-CM | POA: Diagnosis not present

## 2018-01-16 DIAGNOSIS — D509 Iron deficiency anemia, unspecified: Secondary | ICD-10-CM | POA: Diagnosis not present

## 2018-01-16 DIAGNOSIS — E8779 Other fluid overload: Secondary | ICD-10-CM | POA: Diagnosis not present

## 2018-01-18 DIAGNOSIS — N186 End stage renal disease: Secondary | ICD-10-CM | POA: Diagnosis not present

## 2018-01-18 DIAGNOSIS — D509 Iron deficiency anemia, unspecified: Secondary | ICD-10-CM | POA: Diagnosis not present

## 2018-01-18 DIAGNOSIS — E8779 Other fluid overload: Secondary | ICD-10-CM | POA: Diagnosis not present

## 2018-01-18 DIAGNOSIS — N2581 Secondary hyperparathyroidism of renal origin: Secondary | ICD-10-CM | POA: Diagnosis not present

## 2018-01-20 DIAGNOSIS — N186 End stage renal disease: Secondary | ICD-10-CM | POA: Diagnosis not present

## 2018-01-20 DIAGNOSIS — D509 Iron deficiency anemia, unspecified: Secondary | ICD-10-CM | POA: Diagnosis not present

## 2018-01-20 DIAGNOSIS — E8779 Other fluid overload: Secondary | ICD-10-CM | POA: Diagnosis not present

## 2018-01-20 DIAGNOSIS — N2581 Secondary hyperparathyroidism of renal origin: Secondary | ICD-10-CM | POA: Diagnosis not present

## 2018-01-23 DIAGNOSIS — N2581 Secondary hyperparathyroidism of renal origin: Secondary | ICD-10-CM | POA: Diagnosis not present

## 2018-01-23 DIAGNOSIS — E8779 Other fluid overload: Secondary | ICD-10-CM | POA: Diagnosis not present

## 2018-01-23 DIAGNOSIS — D509 Iron deficiency anemia, unspecified: Secondary | ICD-10-CM | POA: Diagnosis not present

## 2018-01-23 DIAGNOSIS — N186 End stage renal disease: Secondary | ICD-10-CM | POA: Diagnosis not present

## 2018-01-25 DIAGNOSIS — D509 Iron deficiency anemia, unspecified: Secondary | ICD-10-CM | POA: Diagnosis not present

## 2018-01-25 DIAGNOSIS — E8779 Other fluid overload: Secondary | ICD-10-CM | POA: Diagnosis not present

## 2018-01-25 DIAGNOSIS — N186 End stage renal disease: Secondary | ICD-10-CM | POA: Diagnosis not present

## 2018-01-25 DIAGNOSIS — N2581 Secondary hyperparathyroidism of renal origin: Secondary | ICD-10-CM | POA: Diagnosis not present

## 2018-01-29 DIAGNOSIS — D509 Iron deficiency anemia, unspecified: Secondary | ICD-10-CM | POA: Diagnosis not present

## 2018-01-29 DIAGNOSIS — N2581 Secondary hyperparathyroidism of renal origin: Secondary | ICD-10-CM | POA: Diagnosis not present

## 2018-01-29 DIAGNOSIS — N186 End stage renal disease: Secondary | ICD-10-CM | POA: Diagnosis not present

## 2018-01-29 DIAGNOSIS — E8779 Other fluid overload: Secondary | ICD-10-CM | POA: Diagnosis not present

## 2018-02-03 DIAGNOSIS — N186 End stage renal disease: Secondary | ICD-10-CM | POA: Diagnosis not present

## 2018-02-03 DIAGNOSIS — N2581 Secondary hyperparathyroidism of renal origin: Secondary | ICD-10-CM | POA: Diagnosis not present

## 2018-02-03 DIAGNOSIS — D509 Iron deficiency anemia, unspecified: Secondary | ICD-10-CM | POA: Diagnosis not present

## 2018-02-03 DIAGNOSIS — E8779 Other fluid overload: Secondary | ICD-10-CM | POA: Diagnosis not present

## 2018-02-04 DIAGNOSIS — N186 End stage renal disease: Secondary | ICD-10-CM | POA: Diagnosis not present

## 2018-02-04 DIAGNOSIS — Z992 Dependence on renal dialysis: Secondary | ICD-10-CM | POA: Diagnosis not present

## 2018-02-04 DIAGNOSIS — E1122 Type 2 diabetes mellitus with diabetic chronic kidney disease: Secondary | ICD-10-CM | POA: Diagnosis not present

## 2018-02-08 ENCOUNTER — Emergency Department (HOSPITAL_COMMUNITY): Payer: Medicare Other

## 2018-02-08 ENCOUNTER — Inpatient Hospital Stay (HOSPITAL_COMMUNITY): Payer: Medicare Other

## 2018-02-08 ENCOUNTER — Inpatient Hospital Stay (HOSPITAL_COMMUNITY)
Admission: EM | Admit: 2018-02-08 | Discharge: 2018-02-15 | DRG: 304 | Disposition: A | Discharge status: Home / Self Care | Payer: Medicare Other | Attending: Internal Medicine | Admitting: Internal Medicine

## 2018-02-08 ENCOUNTER — Encounter (HOSPITAL_COMMUNITY): Payer: Self-pay

## 2018-02-08 DIAGNOSIS — I10 Essential (primary) hypertension: Secondary | ICD-10-CM | POA: Diagnosis not present

## 2018-02-08 DIAGNOSIS — E1042 Type 1 diabetes mellitus with diabetic polyneuropathy: Secondary | ICD-10-CM | POA: Diagnosis present

## 2018-02-08 DIAGNOSIS — I120 Hypertensive chronic kidney disease with stage 5 chronic kidney disease or end stage renal disease: Secondary | ICD-10-CM | POA: Diagnosis not present

## 2018-02-08 DIAGNOSIS — E162 Hypoglycemia, unspecified: Secondary | ICD-10-CM | POA: Diagnosis not present

## 2018-02-08 DIAGNOSIS — Z9115 Patient's noncompliance with renal dialysis: Secondary | ICD-10-CM

## 2018-02-08 DIAGNOSIS — E1039 Type 1 diabetes mellitus with other diabetic ophthalmic complication: Secondary | ICD-10-CM | POA: Diagnosis present

## 2018-02-08 DIAGNOSIS — N2581 Secondary hyperparathyroidism of renal origin: Secondary | ICD-10-CM | POA: Diagnosis not present

## 2018-02-08 DIAGNOSIS — R4020 Unspecified coma: Secondary | ICD-10-CM | POA: Diagnosis not present

## 2018-02-08 DIAGNOSIS — G92 Toxic encephalopathy: Secondary | ICD-10-CM

## 2018-02-08 DIAGNOSIS — E878 Other disorders of electrolyte and fluid balance, not elsewhere classified: Secondary | ICD-10-CM | POA: Diagnosis present

## 2018-02-08 DIAGNOSIS — N529 Male erectile dysfunction, unspecified: Secondary | ICD-10-CM | POA: Diagnosis present

## 2018-02-08 DIAGNOSIS — E1065 Type 1 diabetes mellitus with hyperglycemia: Secondary | ICD-10-CM | POA: Diagnosis present

## 2018-02-08 DIAGNOSIS — R0789 Other chest pain: Secondary | ICD-10-CM | POA: Diagnosis not present

## 2018-02-08 DIAGNOSIS — Z79899 Other long term (current) drug therapy: Secondary | ICD-10-CM | POA: Diagnosis not present

## 2018-02-08 DIAGNOSIS — J984 Other disorders of lung: Secondary | ICD-10-CM | POA: Diagnosis not present

## 2018-02-08 DIAGNOSIS — Z992 Dependence on renal dialysis: Secondary | ICD-10-CM | POA: Diagnosis not present

## 2018-02-08 DIAGNOSIS — H5462 Unqualified visual loss, left eye, normal vision right eye: Secondary | ICD-10-CM | POA: Diagnosis present

## 2018-02-08 DIAGNOSIS — D509 Iron deficiency anemia, unspecified: Secondary | ICD-10-CM | POA: Diagnosis not present

## 2018-02-08 DIAGNOSIS — E1022 Type 1 diabetes mellitus with diabetic chronic kidney disease: Secondary | ICD-10-CM | POA: Diagnosis present

## 2018-02-08 DIAGNOSIS — H5789 Other specified disorders of eye and adnexa: Secondary | ICD-10-CM | POA: Diagnosis present

## 2018-02-08 DIAGNOSIS — R58 Hemorrhage, not elsewhere classified: Secondary | ICD-10-CM | POA: Diagnosis not present

## 2018-02-08 DIAGNOSIS — R079 Chest pain, unspecified: Secondary | ICD-10-CM | POA: Diagnosis not present

## 2018-02-08 DIAGNOSIS — I161 Hypertensive emergency: Principal | ICD-10-CM | POA: Diagnosis present

## 2018-02-08 DIAGNOSIS — R401 Stupor: Secondary | ICD-10-CM | POA: Diagnosis not present

## 2018-02-08 DIAGNOSIS — F40240 Claustrophobia: Secondary | ICD-10-CM | POA: Diagnosis present

## 2018-02-08 DIAGNOSIS — Z794 Long term (current) use of insulin: Secondary | ICD-10-CM

## 2018-02-08 DIAGNOSIS — N186 End stage renal disease: Secondary | ICD-10-CM | POA: Diagnosis present

## 2018-02-08 DIAGNOSIS — I674 Hypertensive encephalopathy: Secondary | ICD-10-CM | POA: Diagnosis present

## 2018-02-08 DIAGNOSIS — D631 Anemia in chronic kidney disease: Secondary | ICD-10-CM | POA: Diagnosis not present

## 2018-02-08 DIAGNOSIS — E875 Hyperkalemia: Secondary | ICD-10-CM | POA: Diagnosis not present

## 2018-02-08 DIAGNOSIS — I160 Hypertensive urgency: Secondary | ICD-10-CM | POA: Diagnosis not present

## 2018-02-08 DIAGNOSIS — R4182 Altered mental status, unspecified: Secondary | ICD-10-CM | POA: Diagnosis present

## 2018-02-08 DIAGNOSIS — Z9114 Patient's other noncompliance with medication regimen: Secondary | ICD-10-CM | POA: Diagnosis not present

## 2018-02-08 DIAGNOSIS — E11649 Type 2 diabetes mellitus with hypoglycemia without coma: Secondary | ICD-10-CM | POA: Diagnosis not present

## 2018-02-08 DIAGNOSIS — R404 Transient alteration of awareness: Secondary | ICD-10-CM

## 2018-02-08 DIAGNOSIS — R569 Unspecified convulsions: Secondary | ICD-10-CM

## 2018-02-08 DIAGNOSIS — E8779 Other fluid overload: Secondary | ICD-10-CM | POA: Diagnosis not present

## 2018-02-08 DIAGNOSIS — E10649 Type 1 diabetes mellitus with hypoglycemia without coma: Secondary | ICD-10-CM | POA: Diagnosis present

## 2018-02-08 DIAGNOSIS — D649 Anemia, unspecified: Secondary | ICD-10-CM | POA: Diagnosis present

## 2018-02-08 DIAGNOSIS — E1069 Type 1 diabetes mellitus with other specified complication: Secondary | ICD-10-CM | POA: Diagnosis present

## 2018-02-08 DIAGNOSIS — G9341 Metabolic encephalopathy: Secondary | ICD-10-CM | POA: Diagnosis present

## 2018-02-08 LAB — COMPREHENSIVE METABOLIC PANEL
ALBUMIN: 4.2 g/dL (ref 3.5–5.0)
ALT: 34 U/L (ref 0–44)
ANION GAP: 18 — AB (ref 5–15)
AST: 27 U/L (ref 15–41)
Alkaline Phosphatase: 94 U/L (ref 38–126)
BILIRUBIN TOTAL: 1.2 mg/dL (ref 0.3–1.2)
BUN: 30 mg/dL — AB (ref 6–20)
CALCIUM: 9 mg/dL (ref 8.9–10.3)
CO2: 28 mmol/L (ref 22–32)
CREATININE: 9.46 mg/dL — AB (ref 0.61–1.24)
Chloride: 88 mmol/L — ABNORMAL LOW (ref 98–111)
GFR calc Af Amer: 7 mL/min — ABNORMAL LOW (ref >60)
GFR calc non Af Amer: 6 mL/min — ABNORMAL LOW (ref >60)
Glucose, Bld: 270 mg/dL — ABNORMAL HIGH (ref 70–99)
POTASSIUM: 4.3 mmol/L (ref 3.5–5.1)
SODIUM: 134 mmol/L — AB (ref 135–145)
Total Protein: 9.2 g/dL — ABNORMAL HIGH (ref 6.5–8.1)

## 2018-02-08 LAB — CBC WITH DIFFERENTIAL/PLATELET
ABS IMMATURE GRANULOCYTES: 0 10*3/uL (ref 0.00–0.07)
BASOS PCT: 1 %
Basophils Absolute: 0 10*3/uL (ref 0.0–0.1)
Eosinophils Absolute: 0 10*3/uL (ref 0.0–0.5)
Eosinophils Relative: 0 %
HCT: 41 % (ref 39.0–52.0)
Hemoglobin: 12.5 g/dL — ABNORMAL LOW (ref 13.0–17.0)
LYMPHS ABS: 1.3 10*3/uL (ref 0.7–4.0)
LYMPHS PCT: 30 %
MCH: 27.5 pg (ref 26.0–34.0)
MCHC: 30.5 g/dL (ref 30.0–36.0)
MCV: 90.1 fL (ref 80.0–100.0)
MONO ABS: 0.3 10*3/uL (ref 0.1–1.0)
Monocytes Relative: 6 %
NRBC: 0 % (ref 0.0–0.2)
NRBC: 0 /100{WBCs}
Neutro Abs: 2.8 10*3/uL (ref 1.7–7.7)
Neutrophils Relative %: 63 %
PLATELETS: 278 10*3/uL (ref 150–400)
RBC: 4.55 MIL/uL (ref 4.22–5.81)
RDW: 13.5 % (ref 11.5–15.5)
WBC: 4.4 10*3/uL (ref 4.0–10.5)

## 2018-02-08 LAB — I-STAT ARTERIAL BLOOD GAS, ED
ACID-BASE EXCESS: 5 mmol/L — AB (ref 0.0–2.0)
Bicarbonate: 30 mmol/L — ABNORMAL HIGH (ref 20.0–28.0)
O2 SAT: 96 %
PCO2 ART: 44.2 mmHg (ref 32.0–48.0)
Patient temperature: 97.7
TCO2: 31 mmol/L (ref 22–32)
pH, Arterial: 7.437 (ref 7.350–7.450)
pO2, Arterial: 78 mmHg — ABNORMAL LOW (ref 83.0–108.0)

## 2018-02-08 LAB — SALICYLATE LEVEL: Salicylate Lvl: <7 mg/dL (ref 2.8–30.0)

## 2018-02-08 LAB — ACETAMINOPHEN LEVEL

## 2018-02-08 LAB — CBG MONITORING, ED
GLUCOSE-CAPILLARY: 257 mg/dL — AB (ref 70–99)
GLUCOSE-CAPILLARY: 378 mg/dL — AB (ref 70–99)
Glucose-Capillary: 392 mg/dL — ABNORMAL HIGH (ref 70–99)

## 2018-02-08 LAB — GLUCOSE, CAPILLARY
GLUCOSE-CAPILLARY: 222 mg/dL — AB (ref 70–99)
Glucose-Capillary: 313 mg/dL — ABNORMAL HIGH (ref 70–99)

## 2018-02-08 LAB — MRSA PCR SCREENING: MRSA by PCR: NEGATIVE

## 2018-02-08 LAB — AMMONIA: Ammonia: 45 umol/L — ABNORMAL HIGH (ref 9–35)

## 2018-02-08 LAB — ETHANOL

## 2018-02-08 MED ORDER — NALOXONE HCL 0.4 MG/ML IJ SOLN
0.4000 mg | Freq: Once | INTRAMUSCULAR | Status: AC
  Administered 2018-02-08: 0.4 mg via INTRAVENOUS
  Filled 2018-02-08: qty 1

## 2018-02-08 MED ORDER — HEPARIN SODIUM (PORCINE) 5000 UNIT/ML IJ SOLN
5000.0000 [IU] | Freq: Three times a day (TID) | INTRAMUSCULAR | Status: DC
  Administered 2018-02-08 – 2018-02-14 (×14): 5000 [IU] via SUBCUTANEOUS
  Filled 2018-02-08 (×18): qty 1

## 2018-02-08 MED ORDER — INSULIN REGULAR(HUMAN) IN NACL 100-0.9 UT/100ML-% IV SOLN
INTRAVENOUS | Status: DC
  Administered 2018-02-09: 2.7 [IU]/h via INTRAVENOUS
  Filled 2018-02-08: qty 100

## 2018-02-08 MED ORDER — INSULIN ASPART 100 UNIT/ML ~~LOC~~ SOLN
1.0000 [IU] | SUBCUTANEOUS | Status: DC
  Administered 2018-02-08: 3 [IU] via SUBCUTANEOUS

## 2018-02-08 MED ORDER — NICARDIPINE HCL IN NACL 20-0.86 MG/200ML-% IV SOLN
3.0000 mg/h | INTRAVENOUS | Status: DC
  Administered 2018-02-08: 5 mg/h via INTRAVENOUS
  Administered 2018-02-08: 10 mg/h via INTRAVENOUS
  Administered 2018-02-08 – 2018-02-09 (×5): 12.5 mg/h via INTRAVENOUS
  Filled 2018-02-08 (×8): qty 200

## 2018-02-08 NOTE — ED Notes (Signed)
Pt transported to MRI via stretcher.  

## 2018-02-08 NOTE — Progress Notes (Signed)
EEG Completed; Results Pending  

## 2018-02-08 NOTE — Consult Note (Addendum)
Neurology Consultation  Reason for Consult: Mental status Referring Physician: Alvino Chapel  CC: Altered mental status status post hemodialysis  History is obtained from: Chart  HPI: Edward Colon is a 37 y.o. 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  Patient apparently missed 1 dose of his hemodialysis and they had to take off 7 L of fluid.  On consultation patient is completely altered and does not respond to pain.  The only response I would get was minimally to ammonia capsule.  And that response was "yeah" when I yelled his name.  Per notes patient was brought in from dialysis secondary to cramping.  Reportedly he is a Tuesday, Thursday, Saturday dialysis patient but missed his Tuesday dialysis.  After his dialysis he reported generalized cramping however on arrival to the ED he was unresponsive, nonverbal and unresponsive to pain.  Neurology was consulted for secondary opinion on altered mental status.  ED course: Head CT, Narcan  ROS: Unable to obtain due to altered mental status.   Past Medical History:  Diagnosis Date  . Anemia   . Blind left eye since ~ 2010  . Depression   . Diabetic peripheral neuropathy ([place])   . ESRD (end stage renal disease) on dialysis [facility])    "TTS; [place] [facility]; [facility]" (02/01/2016)  . Heart murmur    denies any problems with it  . Hypertension   . Seizures ([place])    "last one was end of 2016; they are related to my diabetes" (02/01/2016)  . Type 1 diabetes ([place]) dx'd 1990   Family History  Problem Relation Age of Onset  . Diabetes Neg Hx    Social History:   reports that he has never smoked. He has never used smokeless tobacco. He reports that he does not drink alcohol or use drugs.  Medications No current facility-administered medications for this encounter.   Current Outpatient Medications:  .  calcium acetate (PHOSLO) 667 MG capsule, Take 1 capsule  (667 mg total) by mouth 3 (three) times daily with meals. (Patient taking differently: Take [phone] mg by mouth See admin instructions. Take 4 capsules (2668 mg) by mouth with meals and 2 capsules (1334 mg) with snacks), Disp: 90 capsule, Rfl: 0 .  glucose blood (ONETOUCH VERIO) test strip, USE TO TEST BLOOD SUGAR 4 TIMES DAILY. DX CODE E10.22., Disp: 120 each, Rfl: 11 .  HYDROcodone-acetaminophen (NORCO/VICODIN) 5-325 MG tablet, Take 1-2 tablets by mouth every 6 (six) hours as needed for severe pain., Disp: 15 tablet, Rfl: 0 .  ibuprofen (ADVIL,MOTRIN) 200 MG tablet, Take 800 mg by mouth every 6 (six) hours as needed for headache or moderate pain., Disp: , Rfl:  .  Insulin Glargine (LANTUS SOLOSTAR) 100 UNIT/ML Solostar Pen, Inject 20 Units into the skin daily at 10 pm. And pen needles 1/day (Patient taking differently: Inject 20 Units into the skin daily. ), Disp: 5 pen, Rfl: PRN .  metoprolol tartrate (LOPRESSOR) 25 MG tablet, Take 1 tablet (25 mg total) by mouth every morning., Disp: 60 tablet, Rfl: 11 .  multivitamin (RENA-VIT) TABS tablet, Take 1 tablet by mouth at bedtime. (Patient taking differently: Take 1 tablet by mouth daily. ), Disp: 30 tablet, Rfl: 0   Exam: Current vital signs: BP (!) 245/79   Pulse 80   Temp 97.7 F (36.5 C) (Oral)   Resp 14   SpO2 100%  Vital signs in last 24 hours: Temp:  [97.7 F (36.5  C)] 97.7 F (36.5 C) (12/05 1120) Pulse Rate:  [79-87] 80 (12/05 1413) Resp:  [14-16] 14 (12/05 1413) BP: (216-245)/(79-98) 245/79 (12/05 1413) SpO2:  [98 %-100 %] 100 % (12/05 1413)  Physical Exam  Constitutional: Appears well-developed and well-nourished.  Psych: Affect appropriate to situation Eyes: No scleral injection HENT: No OP obstrucion Head: Normocephalic.  Cardiovascular: Normal rate and regular rhythm.  Respiratory: Effort normal, non-labored breathing GI: Soft.  No distension. There is no tenderness.  Skin: WDI  Neuro: Mental Status: Patient  does not respond to sternal rub, however with plantar stimulation does prolonged he does withdraw both legs.  With an ammonia capsule he did open his eyes slightly and as noted above state "yeah" when his name was called. Cranial Nerves: II: Blink to threat on the right slightly he is blind in his left eye  III,IV, VI: Doll's eyes intact.  When eyelids are opened he has a tendency to look downward..  Right pupil is round and reactive left pupil is abnormal and has a cataract and not reactive  V: No response to pain VII: Facial movement is symmetric.  VIII: hearing is intact to voice  Motor: Patient is not moving his upper extremities however with prolonged plantar stimulation he is moving his lower extremities antigravity briskly Sensory: Only response I got was to noxious stimuli to plantar stimulation as above Deep Tendon Reflexes: No reflexes obtained in the upper extremities or lower extremities Plantars: Mute Cerebellar: Not obtained  Labs I have reviewed labs in epic and the results pertinent to this consultation are:   CBC    Component Value Date/Time   WBC 4.4 02/08/2018 1139   RBC 4.55 02/08/2018 1139   HGB 12.5 (L) 02/08/2018 1139   HCT 41.0 02/08/2018 1139   PLT 278 02/08/2018 1139   MCV 90.1 02/08/2018 1139   MCH 27.5 02/08/2018 1139   MCHC 30.5 02/08/2018 1139   RDW 13.5 02/08/2018 1139   LYMPHSABS 1.3 02/08/2018 1139   MONOABS 0.3 02/08/2018 1139   EOSABS 0.0 02/08/2018 1139   BASOSABS 0.0 02/08/2018 1139    CMP     Component Value Date/Time   NA 134 (L) 02/08/2018 1139   K 4.3 02/08/2018 1139   CL 88 (L) 02/08/2018 1139   CO2 28 02/08/2018 1139   GLUCOSE 270 (H) 02/08/2018 1139   BUN 30 (H) 02/08/2018 1139   CREATININE 9.46 (H) 02/08/2018 1139   CALCIUM 9.0 02/08/2018 1139   PROT 9.2 (H) 02/08/2018 1139   ALBUMIN 4.2 02/08/2018 1139   AST 27 02/08/2018 1139   ALT 34 02/08/2018 1139   ALKPHOS 94 02/08/2018 1139   BILITOT 1.2 02/08/2018 1139    GFRNONAA 6 (L) 02/08/2018 1139   GFRAA 7 (L) 02/08/2018 1139    Imaging I have reviewed the images obtained: CT-scan of the brain-no acute intracranial abnormalities.  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  MRI examination of the brain-pending  Etta Quill PA-C [facility] [phone]  M-F  (9:00 am- 5:00 PM)  02/08/2018, 2:31 PM   Attending addendum Patient seen and examined Brought in from dialysis with altered mental status.  Family says he has had a seizure in the past with severe hypoglycemia.  She has been diagnosed with type 1 diabetes since the age of 89. Emergency room providers evaluated him and have decided to admit him to the ICU for what is about every protection. Initially completely unresponsive,  on repeat exams - improving mentation. My exam, he is comfortably laying in bed, no spontaneous movements.  Pupils equal round reactive light.  No gaze preference.  Extraocular movements intact.  He is able to open his eyes to voice.  He is able to wiggle his toes to command and able to show me 2 fingers on both hands on command. He is able to grimace to noxious stimulation in both upper 70s in both lower extremities equally. Noncontrast CT of the head does not show any acute intracranial abnormalities, no bleeds.  No evidence of evolving stroke.  Assessment:  37 year old male presenting to the emergency room after hemodialysis.  Patient initially had cramping and then showed to be obtunded.  On exam patient only shows grimacing and minimal speech to ammonia capsule and withdrawing from plantar stimuli.  Later exam by attending and shows improving mentation with following commands in all 4 extremities, though still very lethargic  Likely toxic metabolic encephalopathy in the setting of recent metabolic derangements, dialysis disequilibrium syndrome given large volume removal at  dialysis today. Seizure-has a history from the past, need to evaluate for and rule out.  Impression: -Dialysis disequilibrium syndrome -Toxic metabolic encephalopathy - Evaluate for seizure  Recommendations: -MRI brain w/o contrast to r/o acute process such as stroke that might be missed on CT -EEG  -Correction of toxic metabolic changes. -Urine toxicology screen We will follow-up with you.  -- Amie Portland, MD [facility] Pager: [phone] If 7pm to 7am, please call on call as listed on AMION.

## 2018-02-08 NOTE — ED Notes (Signed)
Pt transported to MRI 

## 2018-02-08 NOTE — H&P (Signed)
NAME:  Edward Colon, MRN:  [id number], DOB:  [birth year], LOS: 0 ADMISSION DATE:  02/08/2018, CONSULTATION DATE:  12/5 REFERRING MD:  EDP, CHIEF COMPLAINT:  AMS    Brief History   37yo male with hx DM, HTN, hx seizures (r/t hypoglycemia) and ESRD on HD who presented 12/5 with AMS.  According to family he missed a dialysis session this week and so 7L taken off on day of admission.  Was reportedly awake and talking when brother picked him up for HD in the morning but was "not his normal self", seemed exhausted, drowsy. In ER was obtunded with no gag reflex, no response to narcan.  Did have some response to smelling salts per neuro PA.  W/u also revealed significant hypertension with SBP >200.  PCCM called for ICU admission.   History of present illness   37yo male with hx DM, HTN, hx seizures (r/t hypoglycemia) and ESRD on HD who presented 12/5 with AMS.  According to family he missed a dialysis session this week and so 7L taken off on day of admission.  Was reportedly awake and talking when brother picked him up for HD in the morning but was "not his normal self", seemed exhausted, drowsy. In ER was obtunded with no gag reflex, no response to narcan.  Did have some response to smelling salts per neuro PA.  W/u also revealed significant hypertension with SBP >200.  PCCM called for ICU admission.   Past Medical History   has a past medical history of Anemia, Blind left eye (since ~ 2010), Depression, Diabetic peripheral neuropathy ([place]), ESRD (end stage renal disease) on dialysis ([place]), Heart murmur, Hypertension, Seizures ([place]), and Type 1 diabetes ([place]) (dx'd 1990).   Significant Hospital Events      Consults:  Neuro 12/5>>>  Procedures:    Significant Diagnostic Tests:  EEG 12/5>>  CT head >>> MRI head 12/5>>>  Micro Data:    Antimicrobials:     Interim history/subjective:  Seen in ER with family at bedside.  No distress. Obtunded.   Objective   Blood pressure (!) 243/87,  pulse 80, temperature 97.7 F (36.5 C), temperature source Oral, resp. rate 14, SpO2 98 %.       No intake or output data in the 24 hours ending 02/08/18 1527 There were no vitals filed for this visit.  Examination: General: wdwn young male, NAD but clearly obtunded  HENT: mm moist, downward gaze, pupils pinpoint Lungs: resps even non labored on RA, clear  Cardiovascular: s1s2 rrr  Abdomen: soft, +bs  Extremities: warm and dry, LUA fistula  Neuro: obtunded, slight withdrawal to pain otherwise no response   Resolved Hospital Problem list     Assessment & Plan:   AMS - suspect metabolic encephalopathy v seizure v hypertensive encephalopathy   Hx seizures - seems more r/t hypoglycemia  PLAN  UDS  Neuro following  MRI pending  Spot EEG  Monitor glucose  F/u chem  Check ammonia   HTN urgency  PLAN -  If MRI ok will add cardene gtt for BP control  Resume home metoprolol (awaiting pharmacy home med rec)  DM  PLAN -  Resume home DM rx - awaiting pharmacy med rec   ESRD  PLAN -  Will ask renal to see  F/u chem    Best practice:  Diet: NPO  Pain/Anxiety/Delirium protocol (if indicated): n/a VAP protocol (if indicated): n/a DVT prophylaxis: SQ heparin  GI prophylaxis: n/a Glucose control: lantus  Mobility: bedrest  Code Status: full  Family Communication: updated dad and brother at bedside  Disposition:   Labs   CBC: Recent Labs  Lab 02/08/18 1139  WBC 4.4  NEUTROABS 2.8  HGB 12.5*  HCT 41.0  MCV 90.1  PLT 101    Basic Metabolic Panel: Recent Labs  Lab 02/08/18 1139  NA 134*  K 4.3  CL 88*  CO2 28  GLUCOSE 270*  BUN 30*  CREATININE 9.46*  CALCIUM 9.0   GFR: CrCl cannot be calculated (Unknown ideal weight.). Recent Labs  Lab 02/08/18 1139  WBC 4.4    Liver Function Tests: Recent Labs  Lab 02/08/18 1139  AST 27  ALT 34  ALKPHOS 94  BILITOT 1.2  PROT 9.2*  ALBUMIN 4.2   No results for input(s): LIPASE, AMYLASE in the last 168  hours. No results for input(s): AMMONIA in the last 168 hours.  ABG    Component Value Date/Time   PHART 7.437 02/08/2018 1430   PCO2ART 44.2 02/08/2018 1430   PO2ART 78.0 (L) 02/08/2018 1430   HCO3 30.0 (H) 02/08/2018 1430   TCO2 31 02/08/2018 1430   ACIDBASEDEF 15.0 (H) 03/05/2017 1023   O2SAT 96.0 02/08/2018 1430     Coagulation Profile: No results for input(s): INR, PROTIME in the last 168 hours.  Cardiac Enzymes: No results for input(s): CKTOTAL, CKMB, CKMBINDEX, TROPONINI in the last 168 hours.  HbA1C: Hemoglobin A1C  Date/Time Value Ref Range Status  12/14/2016 10:37 AM 9.7  Final  04/11/2016 10:43 AM 9.7  Final   Hgb A1c MFr Bld  Date/Time Value Ref Range Status  08/31/2014 04:42 AM 10.3 (H) 4.8 - 5.6 % Final    Comment:    (NOTE)         Pre-diabetes: 5.7 - 6.4         Diabetes: >6.4         Glycemic control for adults with diabetes: <7.0   07/18/2013 02:58 PM 7.9 (H) <5.7 % Final    Comment:    (NOTE)                                                                       According to the ADA Clinical Practice Recommendations for 2011, when HbA1c is used as a screening test:  >=6.5%   Diagnostic of Diabetes Mellitus           (if abnormal result is confirmed) 5.7-6.4%   Increased risk of developing Diabetes Mellitus References:Diagnosis and Classification of Diabetes Mellitus,Diabetes BPZW,2585,27(POEUM 1):S62-S69 and Standards of Medical Care in         Diabetes - 2011,Diabetes Care,2011,34 (Suppl 1):S11-S61.    CBG: Recent Labs  Lab 02/08/18 1137 02/08/18 1257 02/08/18 1419  GLUCAP 257* 378* 392*    Review of Systems:   As per HPI - All other systems reviewed and were neg.    Past Medical History  He,  has a past medical history of Anemia, Blind left eye (since ~ 2010), Depression, Diabetic peripheral neuropathy ([place]), ESRD (end stage renal disease) on dialysis ([place]), Heart murmur, Hypertension, Seizures ([place]), and Type 1 diabetes ([place]) (dx'd  1990).   Surgical History    Past Surgical History:  Procedure Laterality Date  . AV FISTULA  PLACEMENT Right 03/03/2015   Procedure: RIGHT RADIO-CEPHALIC ARTERIOVENOUS (AV) FISTULA CREATION;  Surgeon: Serafina Mitchell, MD;  Location: [facility] OR;  Service: Vascular;  Laterality: Right;  . AV FISTULA PLACEMENT Left 06/15/2015   Procedure: INSERTION OF LEFT UPPER ARM  ARTERIOVENOUS (AV) 27mm x 50cm GORE-TEX GRAFT;  Surgeon: Elam Dutch, MD;  Location: [facility];  Service: Vascular;  Laterality: Left;  . BASCILIC VEIN TRANSPOSITION Left 04/01/2015   Procedure: BASCILIC VEIN TRANSPOSITION-LEFT ARM- FIRST STAGE;  Surgeon: Elam Dutch, MD;  Location: [facility];  Service: Vascular;  Laterality: Left;  . EYE SURGERY Right ~ 2010   for diabetic retinopathy  . INSERTION OF DIALYSIS CATHETER Right 03/03/2015   Procedure: INSERTION OF DIALYSIS CATHETER;  Surgeon: Serafina Mitchell, MD;  [facility];  Service: Vascular;  Laterality: Right;     Social History   reports that he has never smoked. He has never used smokeless tobacco. He reports that he does not drink alcohol or use drugs.   Family History   His family history is negative for Diabetes.   Allergies No Known Allergies   Home Medications  Prior to Admission medications   Medication Sig Start Date End Date Taking? Authorizing Provider  calcium acetate (PHOSLO) 667 MG capsule Take 1 capsule (667 mg total) by mouth 3 (three) times daily with meals. Patient taking differently: Take 1,334-2,668 mg by mouth See admin instructions. Take 4 capsules (2668 mg) by mouth with meals and 2 capsules (1334 mg) with snacks 03/07/15   Short, Noah Delaine, MD  glucose blood (ONETOUCH VERIO) test strip USE TO TEST BLOOD SUGAR 4 TIMES DAILY. DX CODE E10.22. 04/28/17   Renato Shin, MD  HYDROcodone-acetaminophen (NORCO/VICODIN) 5-325 MG tablet Take 1-2 tablets by mouth every 6 (six) hours as needed for severe pain. 08/25/17   Hayden Rasmussen, MD  ibuprofen  (ADVIL,MOTRIN) 200 MG tablet Take 800 mg by mouth every 6 (six) hours as needed for headache or moderate pain.    [provider]  Insulin Glargine (LANTUS SOLOSTAR) 100 UNIT/ML Solostar Pen Inject 20 Units into the skin daily at 10 pm. And pen needles 1/day Patient taking differently: Inject 20 Units into the skin daily.  03/08/17   Reyne Dumas, MD  metoprolol tartrate (LOPRESSOR) 25 MG tablet Take 1 tablet (25 mg total) by mouth every morning. 03/08/17 03/08/18  Reyne Dumas, MD  multivitamin (RENA-VIT) TABS tablet Take 1 tablet by mouth at bedtime. Patient taking differently: Take 1 tablet by mouth daily.  03/07/15   [name], MD     Critical care time: 66 min    Nickolas Madrid, NP 02/08/2018  3:27 PM Pager: [phone] or [phone]  Attestation  37 year old male with DM1, ESRD on HD, hx of seizures, HTN, left eye blindness who presented with altered mental status. Family at bedside provided history. Before his dialysis session this morning, patient was reported to be drowsy. After receiving dialysis (7L removed), he reported severe generalized cramping. On arrival to the ED, he was obtunded and unresponsive to voice or pain.  CT head reviewed by myself and was negative for acute intracranial process.  Examination General: Well-developed male, laying in bed HEENT: [place], AT, negative oculocephalic reflex, absent corneal reflex PULM: CTAB CV: RRR, no m/g/g Neuro: Unresponsive, no withdrawal to pain  #Acute encephalopathy, unclear etiology: Head imaging negative, ABG normal, BUN elevated at 30, ammonia 45.  History of seizure that has previously been associated with hypoglycemia.  Neurology following.  Plan for MRI, EEG.  Order UDS if patient produces urine. #Hypertensive emergency: Initiate Cardene drip for BP control #Diabetes: CBG every 4 hours, SSI #ESRD: Consult Nephrology for dialysis  The patient is critically ill with multiple organ systems failure and  requires high complexity decision making for assessment and support, frequent evaluation and titration of therapies, application of advanced monitoring technologies and extensive interpretation of multiple databases.   Critical Care Time devoted to patient care services described in this note is  32 Minutes. This time reflects time of care of this signee Dr. Rodman Pickle. This critical care time does not reflect procedure time, or teaching time or supervisory time of PA/NP/Med student/Med Resident etc but could involve care discussion time.  Rodman Pickle, M.D. [facility] Pulmonary/Critical Care Medicine. Pager: [phone]. After hours pager: [phone].

## 2018-02-08 NOTE — ED Provider Notes (Addendum)
[facility] EMERGENCY DEPARTMENT Provider Note   CSN: [id number] Arrival date & time: 02/08/18  1116     History   Chief Complaint No chief complaint on file.   HPI Edward Colon is a 37 y.o. male. Level 5 caveat due to altered mental status. HPI Patient brought in for reported cramping after dialysis.  Reportedly is a Tuesday Thursday Saturday dialysis patient and missed Tuesdays dialysis with today being Thursday.  Reportedly went in today and had to take 7 L off.  Reported had generalized cramping.  However upon arrival for me he is mostly unresponsive.  Nonverbal.  Minimal response to pain. Past Medical History:  Diagnosis Date  . Anemia   . Blind left eye since ~ 2010  . Depression   . Diabetic peripheral neuropathy ([place])   . ESRD (end stage renal disease) on dialysis [facility])    "TTS; [facility]; [facility]" (02/01/2016)  . Heart murmur    denies any problems with it  . Hypertension   . Seizures ([place])    "last one was end of 2016; they are related to my diabetes" (02/01/2016)  . Type 1 diabetes ([place]) dx'd 1990    Patient Active Problem List   Diagnosis Date Noted  . Diabetic neuropathy ([place])   . Blindness of left eye with normal vision in contralateral eye   . Agitation   . Encephalopathy   . Hypertensive emergency 02/15/2018  . Encephalopathy, hypertensive 02/15/2018  . Redness of left eye 02/15/2018  . Altered mental status 02/08/2018  . Cardiac arrest ([place])   . DKA (diabetic ketoacidoses) ([place]) 03/05/2017  . Ventricular tachycardia ([place]) 03/05/2017  . Acute respiratory failure ([place])   . Involuntary commitment 01/29/2016  . Suicide attempt by substance overdose ([place]) 01/28/2016  . ESRD on dialysis ([place]) 03/07/2015  . Hyperglycemia 03/06/2015  . Acute on chronic renal failure ([place]) 02/27/2015  . URI (upper respiratory infection) 02/27/2015  . Acute-on-chronic kidney injury ([place]) 02/27/2015  . Rhabdomyolysis 08/31/2014  . Hypoglycemia  08/30/2014  . Type 1 diabetes ([place]) 08/30/2014  . CKD stage 2 due to type 2 diabetes mellitus ([place]) 08/30/2014  . Anemia 08/30/2014  . Swelling of arm 05/31/2013  . ARF (acute renal failure) ([place]) 05/31/2013  . Hyperkalemia 05/31/2013  . Seizure ([place]) 05/31/2013  . Seizures ([place])   . Hypertension     Past Surgical History:  Procedure Laterality Date  . AV FISTULA PLACEMENT Right 03/03/2015   Procedure: RIGHT RADIO-CEPHALIC ARTERIOVENOUS (AV) FISTULA CREATION;  Surgeon: Serafina Mitchell, MD;  Location: [facility] OR;  Service: Vascular;  Laterality: Right;  . AV FISTULA PLACEMENT Left 06/15/2015   Procedure: INSERTION OF LEFT UPPER ARM  ARTERIOVENOUS (AV) 38mm x 50cm GORE-TEX GRAFT;  Surgeon: Elam Dutch, MD;  Location: [facility];  Service: Vascular;  Laterality: Left;  . BASCILIC VEIN TRANSPOSITION Left 04/01/2015   Procedure: BASCILIC VEIN TRANSPOSITION-LEFT ARM- FIRST STAGE;  Surgeon: Elam Dutch, MD;  Location: [facility];  Service: Vascular;  Laterality: Left;  . EYE SURGERY Right ~ 2010   for diabetic retinopathy  . INSERTION OF DIALYSIS CATHETER Right 03/03/2015   Procedure: INSERTION OF DIALYSIS CATHETER;  Surgeon: Serafina Mitchell, MD;  Location: [facility] OR;  Service: Vascular;  Laterality: Right;        Home Medications    Prior to Admission medications   Medication Sig Start Date End Date Taking? Authorizing Provider  calcium acetate (PHOSLO) 667 MG capsule Take 1 capsule (667 mg total)  by mouth 3 (three) times daily with meals. Patient taking differently: Take 1,334-2,668 mg by mouth See admin instructions. Take 4 capsules (2668 mg) by mouth with meals and 2 capsules (1334 mg) with snacks 03/07/15  Yes Short, Mackenzie, MD  multivitamin (RENA-VIT) TABS tablet Take 1 tablet by mouth at bedtime. Patient taking differently: Take 1 tablet by mouth daily.  03/07/15  Yes Short, Noah Delaine, MD  insulin glargine (LANTUS) 100 UNIT/ML injection Inject 0.1 mLs (10 Units total) into the skin at  bedtime. 02/15/18   Desiree Hane, MD  isosorbide-hydrALAZINE (BIDIL) 20-37.5 MG tablet Take 1 tablet by mouth 3 (three) times daily. 03/13/18   Ward, Delice Bison, DO  levETIRAcetam (KEPPRA) 500 MG tablet Take 1 tablet (500 mg total) by mouth 2 (two) times daily. 03/13/18   Ward, Delice Bison, DO  losartan (COZAAR) 50 MG tablet Take 1 tablet (50 mg total) by mouth daily. 03/13/18   Ward, Delice Bison, DO  minoxidil (LONITEN) 2.5 MG tablet Take 2.5 mg by mouth 2 (two) times daily.    [provider]  NIFEdipine (ADALAT CC) 30 MG 24 hr tablet Take 1 tablet (30 mg total) by mouth 2 (two) times daily. 03/13/18   Ward, Delice Bison, DO  spironolactone (ALDACTONE) 50 MG tablet Take 1 tablet (50 mg total) by mouth daily. 03/13/18   Ward, Delice Bison, DO    Family History Family History  Problem Relation Age of Onset  . Diabetes Neg Hx     Social History Social History   Tobacco Use  . Smoking status: Never Smoker  . Smokeless tobacco: Never Used  Substance Use Topics  . Alcohol use: No    Frequency: Never    Comment: 02/01/2016 "might have 1 drink/year"  . Drug use: No     Allergies   Patient has no known allergies.   Review of Systems Review of Systems  Unable to perform ROS: Mental status change     Physical Exam Updated Vital Signs BP (!) 178/89 (BP Location: Right Leg)   Pulse 76   Temp 98.2 F (36.8 C) (Oral)   Resp 20   Ht 5\' 11"  (1.803 m)   Wt 90.4 kg   SpO2 100%   BMI 27.80 kg/m   Physical Exam  Constitutional: He appears well-developed.  HENT:  Head: Atraumatic.  Eyes:  Pupils constricted on right.  Appears to be cataract on left.  Neck: Neck supple.  Cardiovascular: Normal rate.  Pulmonary/Chest: Effort normal.  Abdominal: There is no tenderness.  Musculoskeletal: He exhibits no tenderness.  Neurological:  In bed breathing spontaneously.  Pupils constricted.     ED Treatments / Results  Labs (all labs ordered are listed, but only abnormal results are  displayed) Labs Reviewed  COMPREHENSIVE METABOLIC PANEL - Abnormal; Notable for the following components:      Result Value   Sodium 134 (*)    Chloride 88 (*)    Glucose, Bld 270 (*)    BUN 30 (*)    Creatinine, Ser 9.46 (*)    Total Protein 9.2 (*)    GFR calc non Af Amer 6 (*)    GFR calc Af Amer 7 (*)    Anion gap 18 (*)    All other components within normal limits  CBC WITH DIFFERENTIAL/PLATELET - Abnormal; Notable for the following components:   Hemoglobin 12.5 (*)    All other components within normal limits  ACETAMINOPHEN LEVEL - Abnormal; Notable for the following components:  Acetaminophen (Tylenol), Serum <10 (*)    All other components within normal limits  AMMONIA - Abnormal; Notable for the following components:   Ammonia 45 (*)    All other components within normal limits  CBC - Abnormal; Notable for the following components:   Hemoglobin 12.1 (*)    All other components within normal limits  RENAL FUNCTION PANEL - Abnormal; Notable for the following components:   Chloride 97 (*)    BUN 45 (*)    Creatinine, Ser 11.81 (*)    Calcium 8.4 (*)    Phosphorus 5.0 (*)    Albumin 3.1 (*)    GFR calc non Af Amer 5 (*)    GFR calc Af Amer 6 (*)    All other components within normal limits  GLUCOSE, CAPILLARY - Abnormal; Notable for the following components:   Glucose-Capillary 222 (*)    All other components within normal limits  GLUCOSE, CAPILLARY - Abnormal; Notable for the following components:   Glucose-Capillary 313 (*)    All other components within normal limits  GLUCOSE, CAPILLARY - Abnormal; Notable for the following components:   Glucose-Capillary 327 (*)    All other components within normal limits  GLUCOSE, CAPILLARY - Abnormal; Notable for the following components:   Glucose-Capillary 285 (*)    All other components within normal limits  GLUCOSE, CAPILLARY - Abnormal; Notable for the following components:   Glucose-Capillary 211 (*)    All other  components within normal limits  GLUCOSE, CAPILLARY - Abnormal; Notable for the following components:   Glucose-Capillary 110 (*)    All other components within normal limits  GLUCOSE, CAPILLARY - Abnormal; Notable for the following components:   Glucose-Capillary 69 (*)    All other components within normal limits  GLUCOSE, CAPILLARY - Abnormal; Notable for the following components:   Glucose-Capillary 65 (*)    All other components within normal limits  GLUCOSE, CAPILLARY - Abnormal; Notable for the following components:   Glucose-Capillary 105 (*)    All other components within normal limits  GLUCOSE, CAPILLARY - Abnormal; Notable for the following components:   Glucose-Capillary 121 (*)    All other components within normal limits  GLUCOSE, CAPILLARY - Abnormal; Notable for the following components:   Glucose-Capillary 133 (*)    All other components within normal limits  GLUCOSE, CAPILLARY - Abnormal; Notable for the following components:   Glucose-Capillary 204 (*)    All other components within normal limits  GLUCOSE, CAPILLARY - Abnormal; Notable for the following components:   Glucose-Capillary 271 (*)    All other components within normal limits  GLUCOSE, CAPILLARY - Abnormal; Notable for the following components:   Glucose-Capillary 205 (*)    All other components within normal limits  GLUCOSE, CAPILLARY - Abnormal; Notable for the following components:   Glucose-Capillary 120 (*)    All other components within normal limits  GLUCOSE, CAPILLARY - Abnormal; Notable for the following components:   Glucose-Capillary 51 (*)    All other components within normal limits  GLUCOSE, CAPILLARY - Abnormal; Notable for the following components:   Glucose-Capillary 58 (*)    All other components within normal limits  GLUCOSE, CAPILLARY - Abnormal; Notable for the following components:   Glucose-Capillary 106 (*)    All other components within normal limits  GLUCOSE, CAPILLARY -  Abnormal; Notable for the following components:   Glucose-Capillary 124 (*)    All other components within normal limits  TRIGLYCERIDES - Abnormal; Notable for the  following components:   Triglycerides 221 (*)    All other components within normal limits  BASIC METABOLIC PANEL - Abnormal; Notable for the following components:   BUN 56 (*)    Creatinine, Ser 14.03 (*)    Calcium 8.4 (*)    GFR calc non Af Amer 4 (*)    GFR calc Af Amer 5 (*)    Anion gap 17 (*)    All other components within normal limits  PHOSPHORUS - Abnormal; Notable for the following components:   Phosphorus 5.8 (*)    All other components within normal limits  GLUCOSE, CAPILLARY - Abnormal; Notable for the following components:   Glucose-Capillary 254 (*)    All other components within normal limits  GLUCOSE, CAPILLARY - Abnormal; Notable for the following components:   Glucose-Capillary 140 (*)    All other components within normal limits  GLUCOSE, CAPILLARY - Abnormal; Notable for the following components:   Glucose-Capillary 44 (*)    All other components within normal limits  GLUCOSE, CAPILLARY - Abnormal; Notable for the following components:   Glucose-Capillary 225 (*)    All other components within normal limits  GLUCOSE, CAPILLARY - Abnormal; Notable for the following components:   Glucose-Capillary 141 (*)    All other components within normal limits  BASIC METABOLIC PANEL - Abnormal; Notable for the following components:   Sodium 132 (*)    Chloride 91 (*)    Glucose, Bld 331 (*)    BUN 33 (*)    Creatinine, Ser 9.49 (*)    Calcium 8.4 (*)    GFR calc non Af Amer 6 (*)    GFR calc Af Amer 7 (*)    Anion gap 16 (*)    All other components within normal limits  PHOSPHORUS - Abnormal; Notable for the following components:   Phosphorus 5.1 (*)    All other components within normal limits  GLUCOSE, CAPILLARY - Abnormal; Notable for the following components:   Glucose-Capillary 428 (*)    All  other components within normal limits  GLUCOSE, CAPILLARY - Abnormal; Notable for the following components:   Glucose-Capillary 334 (*)    All other components within normal limits  GLUCOSE, CAPILLARY - Abnormal; Notable for the following components:   Glucose-Capillary 122 (*)    All other components within normal limits  GLUCOSE, CAPILLARY - Abnormal; Notable for the following components:   Glucose-Capillary 128 (*)    All other components within normal limits  GLUCOSE, CAPILLARY - Abnormal; Notable for the following components:   Glucose-Capillary 334 (*)    All other components within normal limits  GLUCOSE, CAPILLARY - Abnormal; Notable for the following components:   Glucose-Capillary 192 (*)    All other components within normal limits  BASIC METABOLIC PANEL - Abnormal; Notable for the following components:   Glucose, Bld 49 (*)    BUN 47 (*)    Creatinine, Ser 12.75 (*)    Calcium 8.8 (*)    GFR calc non Af Amer 4 (*)    GFR calc Af Amer 5 (*)    All other components within normal limits  PHOSPHORUS - Abnormal; Notable for the following components:   Phosphorus 7.2 (*)    All other components within normal limits  CBC - Abnormal; Notable for the following components:   RBC 4.18 (*)    Hemoglobin 11.7 (*)    HCT 36.9 (*)    All other components within normal limits  GLUCOSE, CAPILLARY -  Abnormal; Notable for the following components:   Glucose-Capillary 233 (*)    All other components within normal limits  GLUCOSE, CAPILLARY - Abnormal; Notable for the following components:   Glucose-Capillary 262 (*)    All other components within normal limits  GLUCOSE, CAPILLARY - Abnormal; Notable for the following components:   Glucose-Capillary 47 (*)    All other components within normal limits  GLUCOSE, CAPILLARY - Abnormal; Notable for the following components:   Glucose-Capillary 53 (*)    All other components within normal limits  GLUCOSE, CAPILLARY - Abnormal; Notable  for the following components:   Glucose-Capillary 134 (*)    All other components within normal limits  GLUCOSE, CAPILLARY - Abnormal; Notable for the following components:   Glucose-Capillary 263 (*)    All other components within normal limits  GLUCOSE, CAPILLARY - Abnormal; Notable for the following components:   Glucose-Capillary 158 (*)    All other components within normal limits  GLUCOSE, CAPILLARY - Abnormal; Notable for the following components:   Glucose-Capillary 151 (*)    All other components within normal limits  BASIC METABOLIC PANEL - Abnormal; Notable for the following components:   Potassium 6.5 (*)    Chloride 94 (*)    Glucose, Bld 286 (*)    BUN 62 (*)    Creatinine, Ser 15.21 (*)    Calcium 8.2 (*)    GFR calc non Af Amer 4 (*)    GFR calc Af Amer 4 (*)    Anion gap 17 (*)    All other components within normal limits  PHOSPHORUS - Abnormal; Notable for the following components:   Phosphorus 6.9 (*)    All other components within normal limits  CBC - Abnormal; Notable for the following components:   RBC 3.72 (*)    Hemoglobin 10.3 (*)    HCT 33.5 (*)    All other components within normal limits  HEMOGLOBIN A1C - Abnormal; Notable for the following components:   Hgb A1c MFr Bld 9.1 (*)    All other components within normal limits  GLUCOSE, CAPILLARY - Abnormal; Notable for the following components:   Glucose-Capillary 198 (*)    All other components within normal limits  GLUCOSE, CAPILLARY - Abnormal; Notable for the following components:   Glucose-Capillary 17 (*)    All other components within normal limits  GLUCOSE, CAPILLARY - Abnormal; Notable for the following components:   Glucose-Capillary 114 (*)    All other components within normal limits  GLUCOSE, CAPILLARY - Abnormal; Notable for the following components:   Glucose-Capillary 331 (*)    All other components within normal limits  GLUCOSE, CAPILLARY - Abnormal; Notable for the following  components:   Glucose-Capillary 299 (*)    All other components within normal limits  GLUCOSE, CAPILLARY - Abnormal; Notable for the following components:   Glucose-Capillary 136 (*)    All other components within normal limits  GLUCOSE, CAPILLARY - Abnormal; Notable for the following components:   Glucose-Capillary 174 (*)    All other components within normal limits  GLUCOSE, CAPILLARY - Abnormal; Notable for the following components:   Glucose-Capillary 390 (*)    All other components within normal limits  BASIC METABOLIC PANEL - Abnormal; Notable for the following components:   Sodium 134 (*)    Potassium 5.4 (*)    Chloride 94 (*)    Glucose, Bld 352 (*)    BUN 43 (*)    Creatinine, Ser 10.52 (*)  Calcium 8.1 (*)    GFR calc non Af Amer 6 (*)    GFR calc Af Amer 7 (*)    All other components within normal limits  PHOSPHORUS - Abnormal; Notable for the following components:   Phosphorus 6.9 (*)    All other components within normal limits  GLUCOSE, CAPILLARY - Abnormal; Notable for the following components:   Glucose-Capillary 279 (*)    All other components within normal limits  GLUCOSE, CAPILLARY - Abnormal; Notable for the following components:   Glucose-Capillary 476 (*)    All other components within normal limits  GLUCOSE, CAPILLARY - Abnormal; Notable for the following components:   Glucose-Capillary 377 (*)    All other components within normal limits  GLUCOSE, CAPILLARY - Abnormal; Notable for the following components:   Glucose-Capillary 269 (*)    All other components within normal limits  GLUCOSE, CAPILLARY - Abnormal; Notable for the following components:   Glucose-Capillary 259 (*)    All other components within normal limits  GLUCOSE, CAPILLARY - Abnormal; Notable for the following components:   Glucose-Capillary 335 (*)    All other components within normal limits  GLUCOSE, CAPILLARY - Abnormal; Notable for the following components:    Glucose-Capillary 347 (*)    All other components within normal limits  GLUCOSE, CAPILLARY - Abnormal; Notable for the following components:   Glucose-Capillary 410 (*)    All other components within normal limits  BASIC METABOLIC PANEL - Abnormal; Notable for the following components:   Potassium 5.4 (*)    Chloride 94 (*)    Glucose, Bld 144 (*)    BUN 60 (*)    Creatinine, Ser 13.02 (*)    Calcium 8.6 (*)    GFR calc non Af Amer 4 (*)    GFR calc Af Amer 5 (*)    Anion gap 18 (*)    All other components within normal limits  GLUCOSE, CAPILLARY - Abnormal; Notable for the following components:   Glucose-Capillary 223 (*)    All other components within normal limits  GLUCOSE, CAPILLARY - Abnormal; Notable for the following components:   Glucose-Capillary 156 (*)    All other components within normal limits  CBC - Abnormal; Notable for the following components:   RBC 3.78 (*)    Hemoglobin 10.5 (*)    HCT 33.8 (*)    All other components within normal limits  GLUCOSE, CAPILLARY - Abnormal; Notable for the following components:   Glucose-Capillary 127 (*)    All other components within normal limits  CBG MONITORING, ED - Abnormal; Notable for the following components:   Glucose-Capillary 257 (*)    All other components within normal limits  CBG MONITORING, ED - Abnormal; Notable for the following components:   Glucose-Capillary 378 (*)    All other components within normal limits  I-STAT ARTERIAL BLOOD GAS, ED - Abnormal; Notable for the following components:   pO2, Arterial 78.0 (*)    Bicarbonate 30.0 (*)    Acid-Base Excess 5.0 (*)    All other components within normal limits  CBG MONITORING, ED - Abnormal; Notable for the following components:   Glucose-Capillary 392 (*)    All other components within normal limits  MRSA PCR SCREENING  ETHANOL  SALICYLATE LEVEL  GLUCOSE, CAPILLARY  GLUCOSE, CAPILLARY  MAGNESIUM  GLUCOSE, CAPILLARY  MAGNESIUM  MAGNESIUM    GLUCOSE, CAPILLARY  MAGNESIUM  MAGNESIUM    EKG None  Radiology No results found.  Procedures Procedures (including  critical care time)  Medications Ordered in ED Medications  ciprofloxacin (CILOXAN) 0.3 % ophthalmic solution 1-2 drop (2 drops Left Eye Given 02/14/18 0842)  naloxone [facility]) injection 0.4 mg (0.4 mg Intravenous Given 02/08/18 1326)  dextrose 50 % solution 12 mL (12 mLs Intravenous Given 02/09/18 0412)  dextrose 50 % solution (  Duplicate 81/4/48 1856)  dextrose 50 % solution 14 mL (14 mLs Intravenous Given 02/09/18 0519)  dextrose 50 % solution (  Duplicate 31/4/97 0263)  dextrose 50 % solution (  Given 02/09/18 1453)  insulin aspart (novoLOG) injection 12 Units (12 Units Subcutaneous Given 02/10/18 2032)  glucose 4 GM chewable tablet (1 tablet  Given 02/12/18 0425)  dextrose 50 % solution (  Duplicate 78/5/88 5027)  dextrose 50 % solution 50 mL (50 mLs Intravenous Given 02/12/18 1918)  sodium polystyrene (KAYEXALATE) 15 GM/60ML suspension 30 g (30 g Oral Given 02/13/18 0532)     Initial Impression / Assessment and Plan / ED Course  I have reviewed the triage vital signs and the nursing notes.  Pertinent labs & imaging results that were available during my care of the patient were reviewed by me and considered in my medical decision making (see chart for details).     Patient with encephalopathy/mental status change.  Somewhat severely hypertensive.  Initially unresponsive and only had gag reflex.  Mental status gradually improved somewhat during the stay.  Lab work overall reassuring considering he is a dialysis patient also had missed some dialysis.  Head CT reassuring.  Admit to ICU with neurology consult  CRITICAL CARE Performed by: Davonna Belling Total critical care time: 30 minutes Critical care time was exclusive of separately billable procedures and treating other patients. Critical care was necessary to treat or prevent imminent or life-threatening  deterioration. Critical care was time spent personally by me on the following activities: development of treatment plan with patient and/or surrogate as well as nursing, discussions with consultants, evaluation of patient's response to treatment, examination of patient, obtaining history from patient or surrogate, ordering and performing treatments and interventions, ordering and review of laboratory studies, ordering and review of radiographic studies, pulse oximetry and re-evaluation of patient's condition.   Final Clinical Impressions(s) / ED Diagnoses   Final diagnoses:  Altered mental status, unspecified altered mental status type  Hypertension, unspecified type    ED Discharge Orders         Ordered    spironolactone (ALDACTONE) 50 MG tablet  Daily,   Status:  Discontinued     02/15/18 1400    insulin glargine (LANTUS) 100 UNIT/ML injection  Daily at bedtime     02/15/18 1400    ciprofloxacin (CILOXAN) 0.3 % ophthalmic solution  Every 6 hours     02/15/18 1400    losartan (COZAAR) 50 MG tablet  Daily,   Status:  Discontinued     02/15/18 1400    isosorbide-hydrALAZINE (BIDIL) 20-37.5 MG tablet  3 times daily,   Status:  Discontinued     02/15/18 1400    NIFEdipine (ADALAT CC) 30 MG 24 hr tablet  2 times daily,   Status:  Discontinued     02/15/18 1400    Increase activity slowly     02/15/18 1400    Diet - low sodium heart healthy     02/15/18 1400           Davonna Belling, MD 02/08/18 1609    Davonna Belling, MD 03/19/18 1453

## 2018-02-08 NOTE — ED Notes (Signed)
Pt returned from MRI on stretcher, tech reported that pt awoke, informed staff that he was claustrophobic and needs medication.  Dr. Alvino Chapel notified and requested pt to come back to room.  Upon arrival, pt reports cramping to mid-back.

## 2018-02-08 NOTE — ED Notes (Signed)
Pt returned from CT °

## 2018-02-08 NOTE — ED Triage Notes (Signed)
Pt presents with generalized cramping s/p HD today.  Pt missed Tuesday treatment, had 7 liters taken off today but began to cramp; had 1500 mL fluid infused.  Difficulty stopping bleeding from AVF, arrives with clamps in place.  CBG: 161.

## 2018-02-08 NOTE — ED Notes (Signed)
Portable XR at bedside

## 2018-02-08 NOTE — Plan of Care (Signed)
  Problem: Nutrition: Goal: Adequate nutrition will be maintained Outcome: Progressing   Problem: Elimination: Goal: Will not experience complications related to urinary retention Outcome: Progressing   Problem: Pain Managment: Goal: General experience of comfort will improve Outcome: Progressing   

## 2018-02-08 NOTE — Progress Notes (Signed)
eLink Physician-Brief Progress Note Patient Name: Edward Colon DOB: 07/23/1980 MRN: [id number]   Date of Service  02/08/2018  HPI/Events of Note  Hyperglycemia in patient with ESRD on dialysis. Blood sugar in the 200's  eICU Interventions  Sensitive insulin coverage scale ordered        Frederik Pear 02/08/2018, 8:24 PM

## 2018-02-09 DIAGNOSIS — E11649 Type 2 diabetes mellitus with hypoglycemia without coma: Secondary | ICD-10-CM

## 2018-02-09 DIAGNOSIS — E878 Other disorders of electrolyte and fluid balance, not elsewhere classified: Secondary | ICD-10-CM

## 2018-02-09 DIAGNOSIS — I160 Hypertensive urgency: Secondary | ICD-10-CM

## 2018-02-09 DIAGNOSIS — R4182 Altered mental status, unspecified: Secondary | ICD-10-CM

## 2018-02-09 LAB — CBC
HCT: 39.4 % (ref 39.0–52.0)
Hemoglobin: 12.1 g/dL — ABNORMAL LOW (ref 13.0–17.0)
MCH: 27.2 pg (ref 26.0–34.0)
MCHC: 30.7 g/dL (ref 30.0–36.0)
MCV: 88.5 fL (ref 80.0–100.0)
Platelets: 334 10*3/uL (ref 150–400)
RBC: 4.45 MIL/uL (ref 4.22–5.81)
RDW: 12.9 % (ref 11.5–15.5)
WBC: 4.1 10*3/uL (ref 4.0–10.5)
nRBC: 0 % (ref 0.0–0.2)

## 2018-02-09 LAB — RENAL FUNCTION PANEL
ALBUMIN: 3.1 g/dL — AB (ref 3.5–5.0)
Anion gap: 14 (ref 5–15)
BUN: 45 mg/dL — ABNORMAL HIGH (ref 6–20)
CO2: 29 mmol/L (ref 22–32)
Calcium: 8.4 mg/dL — ABNORMAL LOW (ref 8.9–10.3)
Chloride: 97 mmol/L — ABNORMAL LOW (ref 98–111)
Creatinine, Ser: 11.81 mg/dL — ABNORMAL HIGH (ref 0.61–1.24)
GFR calc Af Amer: 6 mL/min — ABNORMAL LOW (ref >60)
GFR calc non Af Amer: 5 mL/min — ABNORMAL LOW (ref >60)
Glucose, Bld: 74 mg/dL (ref 70–99)
Phosphorus: 5 mg/dL — ABNORMAL HIGH (ref 2.5–4.6)
Potassium: 4.5 mmol/L (ref 3.5–5.1)
SODIUM: 140 mmol/L (ref 135–145)

## 2018-02-09 LAB — GLUCOSE, CAPILLARY
GLUCOSE-CAPILLARY: 58 mg/dL — AB (ref 70–99)
Glucose-Capillary: 105 mg/dL — ABNORMAL HIGH (ref 70–99)
Glucose-Capillary: 106 mg/dL — ABNORMAL HIGH (ref 70–99)
Glucose-Capillary: 110 mg/dL — ABNORMAL HIGH (ref 70–99)
Glucose-Capillary: 120 mg/dL — ABNORMAL HIGH (ref 70–99)
Glucose-Capillary: 121 mg/dL — ABNORMAL HIGH (ref 70–99)
Glucose-Capillary: 124 mg/dL — ABNORMAL HIGH (ref 70–99)
Glucose-Capillary: 133 mg/dL — ABNORMAL HIGH (ref 70–99)
Glucose-Capillary: 204 mg/dL — ABNORMAL HIGH (ref 70–99)
Glucose-Capillary: 205 mg/dL — ABNORMAL HIGH (ref 70–99)
Glucose-Capillary: 211 mg/dL — ABNORMAL HIGH (ref 70–99)
Glucose-Capillary: 271 mg/dL — ABNORMAL HIGH (ref 70–99)
Glucose-Capillary: 285 mg/dL — ABNORMAL HIGH (ref 70–99)
Glucose-Capillary: 327 mg/dL — ABNORMAL HIGH (ref 70–99)
Glucose-Capillary: 51 mg/dL — ABNORMAL LOW (ref 70–99)
Glucose-Capillary: 65 mg/dL — ABNORMAL LOW (ref 70–99)
Glucose-Capillary: 69 mg/dL — ABNORMAL LOW (ref 70–99)
Glucose-Capillary: 91 mg/dL (ref 70–99)
Glucose-Capillary: 92 mg/dL (ref 70–99)

## 2018-02-09 MED ORDER — DEXTROSE 50 % IV SOLN
INTRAVENOUS | Status: AC
  Administered 2018-02-09: 15:00:00
  Filled 2018-02-09: qty 50

## 2018-02-09 MED ORDER — LEVETIRACETAM IN NACL 1500 MG/100ML IV SOLN: 1500.0000 mg | Freq: Once | INTRAVENOUS | Status: DC

## 2018-02-09 MED ORDER — DEXTROSE 50 % IV SOLN
INTRAVENOUS | Status: AC
  Filled 2018-02-09: qty 50

## 2018-02-09 MED ORDER — CHLORHEXIDINE GLUCONATE CLOTH 2 % EX PADS
6.0000 | MEDICATED_PAD | Freq: Every day | CUTANEOUS | Status: DC
  Administered 2018-02-10 – 2018-02-12 (×3): 6 via TOPICAL

## 2018-02-09 MED ORDER — LEVETIRACETAM IN NACL 500 MG/100ML IV SOLN: 500.0000 mg | Freq: Two times a day (BID) | INTRAVENOUS | Status: DC

## 2018-02-09 MED ORDER — METOPROLOL TARTRATE 25 MG PO TABS
25.0000 mg | ORAL_TABLET | Freq: Every morning | ORAL | Status: DC
  Administered 2018-02-10: 25 mg via ORAL
  Filled 2018-02-09: qty 1

## 2018-02-09 MED ORDER — INSULIN GLARGINE 100 UNIT/ML ~~LOC~~ SOLN
14.0000 [IU] | Freq: Every day | SUBCUTANEOUS | Status: DC
  Administered 2018-02-09: 14 [IU] via SUBCUTANEOUS
  Filled 2018-02-09: qty 0.14

## 2018-02-09 MED ORDER — CLEVIDIPINE BUTYRATE 0.5 MG/ML IV EMUL
0.0000 mg/h | INTRAVENOUS | Status: DC
  Administered 2018-02-09: 18 mg/h via INTRAVENOUS
  Administered 2018-02-09: 5 mg/h via INTRAVENOUS
  Administered 2018-02-10: 14 mg/h via INTRAVENOUS
  Administered 2018-02-10: 16 mg/h via INTRAVENOUS
  Administered 2018-02-10: 10 mg/h via INTRAVENOUS
  Administered 2018-02-10: 16 mg/h via INTRAVENOUS
  Administered 2018-02-10: 6 mg/h via INTRAVENOUS
  Administered 2018-02-11: 7 mg/h via INTRAVENOUS
  Administered 2018-02-11: 6 mg/h via INTRAVENOUS
  Administered 2018-02-11: 9 mg/h via INTRAVENOUS
  Administered 2018-02-11: 7 mg/h via INTRAVENOUS
  Administered 2018-02-12: 5 mg/h via INTRAVENOUS
  Administered 2018-02-12 (×2): 9 mg/h via INTRAVENOUS
  Administered 2018-02-13: 5 mg/h via INTRAVENOUS
  Filled 2018-02-09: qty 100
  Filled 2018-02-09 (×8): qty 50
  Filled 2018-02-09: qty 100
  Filled 2018-02-09: qty 50
  Filled 2018-02-09 (×2): qty 100
  Filled 2018-02-09: qty 50
  Filled 2018-02-09: qty 100
  Filled 2018-02-09: qty 50

## 2018-02-09 MED ORDER — DEXTROSE 50 % IV SOLN
12.0000 mL | Freq: Once | INTRAVENOUS | Status: AC
  Administered 2018-02-09: 12 mL via INTRAVENOUS

## 2018-02-09 MED ORDER — DEXTROSE 50 % IV SOLN
14.0000 mL | Freq: Once | INTRAVENOUS | Status: AC
  Administered 2018-02-09: 14 mL via INTRAVENOUS

## 2018-02-09 MED ORDER — NICARDIPINE HCL IN NACL 40-0.83 MG/200ML-% IV SOLN
3.0000 mg/h | INTRAVENOUS | Status: DC
  Administered 2018-02-09: 10 mg/h via INTRAVENOUS
  Administered 2018-02-09: 15 mg/h via INTRAVENOUS
  Filled 2018-02-09 (×2): qty 200

## 2018-02-09 MED ORDER — INSULIN ASPART 100 UNIT/ML ~~LOC~~ SOLN
0.0000 [IU] | SUBCUTANEOUS | Status: DC
  Administered 2018-02-09: 2 [IU] via SUBCUTANEOUS
  Administered 2018-02-10: 1 [IU] via SUBCUTANEOUS
  Administered 2018-02-10: 3 [IU] via SUBCUTANEOUS
  Administered 2018-02-10: 5 [IU] via SUBCUTANEOUS
  Administered 2018-02-11: 3 [IU] via SUBCUTANEOUS
  Administered 2018-02-11: 7 [IU] via SUBCUTANEOUS
  Administered 2018-02-11: 2 [IU] via SUBCUTANEOUS
  Administered 2018-02-11 (×2): 1 [IU] via SUBCUTANEOUS
  Administered 2018-02-11: 7 [IU] via SUBCUTANEOUS
  Administered 2018-02-12: 1 [IU] via SUBCUTANEOUS
  Administered 2018-02-12: 5 [IU] via SUBCUTANEOUS

## 2018-02-09 NOTE — Progress Notes (Addendum)
Neurology Progress Note  Brief History: Edward Colon is a 37 y.o. male with type 1 diabetes, seizures, hypertension, end-stage renal disease with hemodialysis,blind in the left eye.  Patient apparently missed 1 dose of his hemodialysis and they had to take off 7 L of fluid. Patient initially had cramping during HD and then became obtunded at time of arrival to ER. Neurological consult requested for AMS.   Subjective:  Pt is back to baseline mentation. EEG is abnormal, but likely d/t metabolic derangement. No seizures ROS: T1DM, blind in left eye, ESRD on HD otherwise as above.  CC: "tired"  Allergies No Known Allergies  Home Medications Medications Prior to Admission  Medication Sig Dispense Refill  . calcium acetate (PHOSLO) 667 MG capsule Take 1 capsule (667 mg total) by mouth 3 (three) times daily with meals. (Patient taking differently: Take [phone] mg by mouth See admin instructions. Take 4 capsules (2668 mg) by mouth with meals and 2 capsules (1334 mg) with snacks) 90 capsule 0  . ibuprofen (ADVIL,MOTRIN) 200 MG tablet Take 800 mg by mouth every 6 (six) hours as needed for headache or moderate pain.    Marland Kitchen glucose blood (ONETOUCH VERIO) test strip USE TO TEST BLOOD SUGAR 4 TIMES DAILY. DX CODE E10.22. 120 each 11  . HYDROcodone-acetaminophen (NORCO/VICODIN) 5-325 MG tablet Take 1-2 tablets by mouth every 6 (six) hours as needed for severe pain. 15 tablet 0  . Insulin Glargine (LANTUS SOLOSTAR) 100 UNIT/ML Solostar Pen Inject 20 Units into the skin daily at 10 pm. And pen needles 1/day (Patient taking differently: Inject 20 Units into the skin daily. ) 5 pen PRN  . metoprolol tartrate (LOPRESSOR) 25 MG tablet Take 1 tablet (25 mg total) by mouth every morning. 60 tablet 11  . multivitamin (RENA-VIT) TABS tablet Take 1 tablet by mouth at bedtime. (Patient taking differently: Take 1 tablet by mouth daily. ) 30 tablet 0    Hospital Medications . heparin  5,000 Units Subcutaneous  Q8H   Objective  Intake/Output from previous day: 12/05 0701 - 12/06 0700 In: 1328.3 [I.V.:1328.3] Out: 200 [Urine:200] Intake/Output this shift: Total I/O In: 361.1 [I.V.:361.1] Out: -  Nutritional status:  Diet Order            Diet heart healthy/carb modified Room service appropriate? Yes; Fluid consistency: Thin  Diet effective now               Physical Exam -  Vitals:   02/09/18 0815 02/09/18 0830 02/09/18 0845 02/09/18 0900  BP: (!) 193/73 (!) 182/62 (!) 161/57 (!) 169/58  Pulse: 96 95 90 89  Resp: 18 17 14 17   Temp:      TempSrc:      SpO2: 99% 94% 95% 96%  Weight:       General - well nourished; no distress Heart - Regular rate and rhythm - no murmer Lungs - Clear to auscultation Abdomen - Soft - non tender Extremities - Distal pulses intact - no edema Skin - Warm and dry  Neurologic Exam:   Mental Status:  Alert, oriented, thought content appropriate. Speech without evidence of dysarthria or aphasia. Able to follow 3 step commands without difficulty.  Cranial Nerves:  II-blind in left eye; visual field intact on right III/IV/VI-Pupils were equal and reacted. Extraocular movements were full.  V/VII-no facial numbness and no facial weakness.  VIII-hearing normal.  X-normal speech and symmetrical palatal movement.  XII-midline tongue extension  Motor: Right : Upper extremity   5/5  Left:     Upper extremity   5/5  Lower extremity   5/5     Lower extremity   5/5 Tone and bulk:normal tone throughout; no atrophy noted Sensory: Intact to light touch in all extremities. Deep Tendon Reflexes: 2/4 throughout Plantars: Downgoing bilaterally  Cerebellar: Normal finger to nose and heel to shin bilaterally. Gait: not tested    LABORATORY RESULTS:  Basic Metabolic Panel: Recent Labs  Lab 02/08/18 1139 02/09/18 0353  NA 134* 140  K 4.3 4.5  CL 88* 97*  CO2 28 29  GLUCOSE 270* 74  BUN 30* 45*  CREATININE 9.46* 11.81*  CALCIUM 9.0 8.4*  PHOS   --  5.0*    Liver Function Tests: Recent Labs  Lab 02/08/18 1139 02/09/18 0353  AST 27  --   ALT 34  --   ALKPHOS 94  --   BILITOT 1.2  --   PROT 9.2*  --   ALBUMIN 4.2 3.1*   No results for input(s): LIPASE, AMYLASE in the last 168 hours. Recent Labs  Lab 02/08/18 1818  AMMONIA 45*    CBC: Recent Labs  Lab 02/08/18 1139 02/09/18 0353  WBC 4.4 4.1  NEUTROABS 2.8  --   HGB 12.5* 12.1*  HCT 41.0 39.4  MCV 90.1 88.5  PLT 278 334   CBG: Recent Labs  Lab 02/09/18 0513 02/09/18 0539 02/09/18 0633 02/09/18 0802 02/09/18 0858  GLUCAP 65* 105* 121* 133* 204*   IMAGING RESULTS Reviewed: Ct Head Wo Contrast  Result Date: 02/08/2018 CLINICAL DATA:  Altered level of consciousness EXAM: CT HEAD WITHOUT CONTRAST TECHNIQUE: Contiguous axial images were obtained from the base of the skull through the vertex without intravenous contrast. COMPARISON:  01/28/2016 FINDINGS: BRAIN: The ventricles and sulci are normal. No intraparenchymal hemorrhage, mass effect nor midline shift. No acute large vascular territory infarcts. Grey-white matter distinction is maintained. The basal ganglia are unremarkable. No abnormal extra-axial fluid collections. Basal cisterns are not effaced and midline. The brainstem and cerebellar hemispheres are without acute abnormalities. VASCULAR: Unremarkable. SKULL/SOFT TISSUES: No skull fracture. No significant soft tissue swelling. ORBITS/SINUSES: Redemonstration of curvilinear coarse calcification along the posterior aspect of the left globe with what may represent chronic choroid attachment accounting for elliptical densities along the posterior aspect of the left possibly from effusions. Differential possibilities might include a chronic retinal detachment. Mastoid air cells are clear. The included paranasal sinuses are well-aerated. OTHER: None. IMPRESSION: 1. No acute intracranial abnormality. 2. 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 Electronically Signed   By: Ashley Royalty M.D.   On: 02/08/2018 14:03   Dg Chest Portable 1 View  Result Date: 02/08/2018 CLINICAL DATA:  Generalized cramping following hemodialysis therapy EXAM: PORTABLE CHEST 1 VIEW COMPARISON:  08/25/17 FINDINGS: Cardiac shadow is mildly enlarged but stable. The lungs are well aerated bilaterally. No focal infiltrate or sizable effusion is seen. No bony abnormality is noted. IMPRESSION: No active disease. Electronically Signed   By: Inez Catalina M.D.   On: 02/08/2018 13:44     EEG Reviewed report and actual EEG with Dr Rory Percy. While this is an abnormal read as multifocal sharps, upon review it appears to be more of a toxic/metabolic effect. No seizures  Assessment/Plan:  37 year old male presenting to the emergency room after hemodialysis.  Patient initially had cramping and then showed to be  obtunded.  He is now back to baseline.   # Encephalopathy- Likely toxic metabolic encephalopathy in the setting of recent metabolic derangements, dialysis disequilibrium syndrome given large volume removal at dialysis  # Seizure-has a history from the past, but was thought to be provoked in setting of hypoglycemia. EEG as above. Do not see reason to start ASD at this time. Will recommend f/u with out pt neuro    Impression: -Dialysis disequilibrium syndrome -Toxic metabolic encephalopathy  Recommendations: -d/c MRI brain now that pt is back to baseline mentation and no focal deficits -Correction of toxic metabolic changes and supportive care -We will s/o. Please call back if needed. Out pt f/u recommended as above   Attending Neurohospitalist Addendum Patient seen and examined with APP/Resident. Agree with the history and physical as documented above. Agree with the plan as documented,  which I helped formulate. I have independently reviewed the chart, obtained history, review of systems and examined the patient.I have personally reviewed pertinent head/neck/spine imaging (CT/MRI).  Patient is returning back to his baseline. No need for further imaging. Correction of toxic metabolic derangements per primary team as you are. His clinical exam is nonfocal today.   Please feel free to call with any questions. --- Amie Portland, MD [facility] Pager: [phone]  If 7pm to 7am, please call on call as listed on AMION.

## 2018-02-09 NOTE — Progress Notes (Signed)
Patients 1300 blood sugar 56. Patient currently eating, orange juice given. Insulin gtt turned off at this time.  1315- Blood sugar remains at 56. D5 pushed per protocol. Will recheck.  Lianne Bushy RN BSN.

## 2018-02-09 NOTE — Progress Notes (Addendum)
Inpatient Diabetes Program Recommendations  AACE/ADA: New Consensus Statement on Inpatient Glycemic Control (2015)  Target Ranges:  Prepandial:   less than 140 mg/dL      Peak postprandial:   less than 180 mg/dL (1-2 hours)      Critically ill patients:  140 - 180 mg/dL   Lab Results  Component Value Date   GLUCAP 120 (H) 02/09/2018   HGBA1C 9.7 12/14/2016    Review of Glycemic ControlResults for ARIES, TOWNLEY (MRN [id number]) as of 02/09/2018 12:37  Ref. Range 02/09/2018 04:04 02/09/2018 04:23 02/09/2018 05:13 02/09/2018 05:39 02/09/2018 06:33 02/09/2018 08:02 02/09/2018 08:58 02/09/2018 09:58 02/09/2018 10:55 02/09/2018 12:00  Glucose-Capillary Latest Ref Range: 70 - 99 mg/dL 69 (L) 92 65 (L) 105 (H) 121 (H) 133 (H) 204 (H) 271 (H) 205 (H) 120 (H)    Diabetes history: DM 1 Outpatient Diabetes medications:  Lantus 20 units daily Current orders for Inpatient glycemic control:  IV insulin Inpatient Diabetes Program Recommendations:    Note that PO diet ordered.  May consider transition off insulin drip to Lantus 14 units daily.  Also may consider Novolog sensitive tid with meals and Novolog meal coverage 3 units tid with meals.  Will message MD.    Thanks,  Adah Perl, RN, BC-ADM Inpatient Diabetes Coordinator Pager [phone] spoke to patient regarding home DM and possible reasons for hyperglycemia.  He states that he does not think that he took his insulin on the day of dialysis and that this may have contributed to increased blood sugars.  Discussed current plan of transitioning off insulin drip.  Patient states he does vary dose of Novolog at home based on what he eats and how high his blood sugars is.  No further questions/concerns at this time.

## 2018-02-09 NOTE — Progress Notes (Signed)
NAME:  Edward Colon, MRN:  [id number], DOB:  1980/05/04, LOS: 1 ADMISSION DATE:  02/08/2018, CONSULTATION DATE:  12/5 REFERRING MD:  EDP, CHIEF COMPLAINT:  AMS    Brief History   37yo male with hx DM, HTN, hx seizures (r/t hypoglycemia) and ESRD on HD who presented 12/5 with AMS.  According to family he missed a dialysis session this week and so 7L taken off on day of admission.  Was reportedly awake and talking when brother picked him up for HD in the morning but was "not his normal self", seemed exhausted, drowsy. In ER was obtunded with no gag reflex, no response to narcan.  Did have some response to smelling salts per neuro PA.  W/u also revealed significant hypertension with SBP >200.  PCCM called for ICU admission.     Past Medical History   has a past medical history of Anemia, Blind left eye (since ~ 2010), Depression, Diabetic peripheral neuropathy ([place]), ESRD (end stage renal disease) on dialysis ([place]), Heart murmur, Hypertension, Seizures ([place]), and Type 1 diabetes ([place]) (dx'd 1990).   Significant Hospital Events   12/6 Admitted for encephalopathy  Consults:  Neuro 12/5>>>12/6 Nephrology 12/6  Procedures:    Significant Diagnostic Tests:  EEG 12/5>> No seizure activity present in study CT head >>> MRI head 12/5>>>Cancelled  Micro Data:    Antimicrobials:     Interim history/subjective:  Woke up during MRI complaining of claustrophobia. No events overnight. This morning, sleeping but easily awakened. No complaints. Reports he is feeling sad however remains vague. Denies SI. Denies shortness of breath or chest pain.  Objective   Blood pressure (!) 173/86, pulse (!) 101, temperature 98 F (36.7 C), resp. rate 19, weight 92.1 kg, SpO2 97 %.        Intake/Output Summary (Last 24 hours) at 02/09/2018 1443 Last data filed at 02/09/2018 1400 Gross per 24 hour  Intake 1998.35 ml  Output 200 ml  Net 1798.35 ml   Filed Weights   02/09/18 0500  Weight: 92.1 kg      Examination: General: sleeping, easily awakened to voice, laying in bed, NAD HENT: EOMI, no scleral icterus Lungs: CTAB, no wheezes or rhonchi Cardiovascular: RRR, systolic ejection murmur present Abdomen: Soft, NTTP, BS+ Extremities: LUE AVF, palpable thrill and bruit present, no edema Neuro: AAOx4, CNII-XII grossly intact, normal strength and sensation Psych: Depressed, tearful   Resolved Hospital Problem list     Assessment & Plan:   #Altered mental status  On admission, patient obtunded. Now drowsy and easily awakened. Unclear etiology though he has episodes of severe hypoglycemia while inpatient. Consider metabolic vs hypertensive encephalopathy. Hx of seizure-like activity secondary to hypoglycemia.  EEG negative for seizure activity. -MRI is no longer indicated per Neurology -DM management as below    #HTN Urgency Non-adherent due to not believing his medications were effective. -Continue Cleviprex ggt to maintain SBP<160 -Restart home metoprolol  #Insulin-dependent DM Type 1 c/b peripheral nephrology, left eye blindness #Severe Hypoglycemia -Will transition to long acting insulin. Appreciate INPT DM recommendations -CBG q1h  -Dex50 PRN for hypoglycemia  #ESRD secondary to DM Last dialysis session 12/5 with removal of 4.8L. Hx of missing dialysis recently including the week prior to this admission. -Nephrology consulted. No acute indications for dialysis -HD tomorrow  -BMP, Mg, Phos daily   Best practice:  Diet: Diabetic Diet Pain/Anxiety/Delirium protocol (if indicated): n/a VAP protocol (if indicated): n/a DVT prophylaxis: SQ heparin  GI prophylaxis: n/a Glucose control: lantus  Mobility:  bedrest Code Status: full  Family Communication: No family at bedside Disposition: Remain in ICU   Labs   CBC: Recent Labs  Lab 02/08/18 1139 02/09/18 0353  WBC 4.4 4.1  NEUTROABS 2.8  --   HGB 12.5* 12.1*  HCT 41.0 39.4  MCV 90.1 88.5  PLT 278 334     Basic Metabolic Panel: Recent Labs  Lab 02/08/18 1139 02/09/18 0353  NA 134* 140  K 4.3 4.5  CL 88* 97*  CO2 28 29  GLUCOSE 270* 74  BUN 30* 45*  CREATININE 9.46* 11.81*  CALCIUM 9.0 8.4*  PHOS  --  5.0*   GFR: CrCl cannot be calculated (Unknown ideal weight.). Recent Labs  Lab 02/08/18 1139 02/09/18 0353  WBC 4.4 4.1    Liver Function Tests: Recent Labs  Lab 02/08/18 1139 02/09/18 0353  AST 27  --   ALT 34  --   ALKPHOS 94  --   BILITOT 1.2  --   PROT 9.2*  --   ALBUMIN 4.2 3.1*   No results for input(s): LIPASE, AMYLASE in the last 168 hours. Recent Labs  Lab 02/08/18 1818  AMMONIA 45*   HbA1C: Hemoglobin A1C  Date/Time Value Ref Range Status  12/14/2016 10:37 AM 9.7  Final  04/11/2016 10:43 AM 9.7  Final   Hgb A1c MFr Bld  Date/Time Value Ref Range Status  08/31/2014 04:42 AM 10.3 (H) 4.8 - 5.6 % Final    Comment:    (NOTE)         Pre-diabetes: 5.7 - 6.4         Diabetes: >6.4         Glycemic control for adults with diabetes: <7.0   07/18/2013 02:58 PM 7.9 (H) <5.7 % Final    Comment:    (NOTE)                                                                       According to the ADA Clinical Practice Recommendations for 2011, when HbA1c is used as a screening test:  >=6.5%   Diagnostic of Diabetes Mellitus           (if abnormal result is confirmed) 5.7-6.4%   Increased risk of developing Diabetes Mellitus References:Diagnosis and Classification of Diabetes Mellitus,Diabetes IONG,2952,84(XLKGM 1):S62-S69 and Standards of Medical Care in         Diabetes - 2011,Diabetes Care,2011,34 (Suppl 1):S11-S61.    CBG: Recent Labs  Lab 02/09/18 1200 02/09/18 1258 02/09/18 1315 02/09/18 1343 02/09/18 [place]    Rodman Pickle, M.D. [facility] Pulmonary/Critical Care Medicine Pager: [phone] After hours pager: [phone]

## 2018-02-09 NOTE — Progress Notes (Signed)
ELECTROENCEPHALOGRAM REPORT Date of Study: 02/08/18  MRN: [id number]  Clinical History:  37yo male with hx DM, HTN, hx seizures (r/t hypoglycemia) and ESRD on HD who presented 12/5 with AMS.  According to family he missed a dialysis session this week and so 7L taken off on day of admission.  Was reportedly awake and talking when brother picked him up for HD in the morning but was "not his normal self", seemed exhausted, drowsy. In ER was obtunded with no gag reflex, no response to narcan.  Did have some response to smelling salts per neuro PA.  W/u also revealed significant hypertension with SBP >200  Medications:none  Technical Summary: A multichannel digital EEG recording measured by the international 10-20 system with electrodes applied with paste and impedances below 5000 ohms performed in our laboratory with EKG monitoring in an awake and drowsy  patient.Photic stimulation were not performed. The digital EEG was referentially recorded, reformatted, and digitally filtered in a variety of bipolar and referential montages for optimal display.  Description:  The patient is awake and drowsy during the recording. During maximal wakefulness, there is a symmetric, medium voltage 10 Hz posterior dominant rhythm that attenuates with eye opening. The record is symmetric. During drowsiness and sleep, there is an increase in theta slowing of the background. Multiple sharp waves were noted in this recording bilaterally but prodominantly from the left.  Photic stimulation did not elicit any abnormalities.   There were no electrographic seizures seen.  EKG lead was unremarkable.  Impression:  This awake and drowsay EEG is abnormal due to the presence of multiple sharp waves more predominant on the Left. There were no electrographic seizures in this study.

## 2018-02-09 NOTE — Consult Note (Signed)
Reason for Consult: Continuity of ESRD care Referring Physician: Ginnie Smart MD (CCM)  HPI: (Patient unable to provide history due to level of consciousness and this is pieced together from prior records) 37 year old African-American man with past medical history significant for insulin-dependent diabetes, hypertension (not taking medications because of perceived inefficacy), peripheral neuropathy, history of seizure associated with hypoglycemia and end-stage renal disease on hemodialysis on a Tuesday/Thursday/Saturday schedule.  He last underwent dialysis yesterday with documented net fluid removal of 4.8 L (he was 7.3 L over his dry weight but not all this was taken off).  Of late, he has been having several missed dialysis sessions and a few months ago stopped taking his antihypertensive medications because "they do not work".  He was brought into the emergency room with altered mental status with increased drowsiness/poor response and in the emergency room found to have significantly elevated blood pressures.  He is unable to provide any additional history when seen.  Dialysis prescription: TTS, [facility], 4 hours 15 minutes, 180 dialyzer, BFR 500/DFR 800, EDW 88.5 kg, 2K/2.25 calcium, left upper arm brachiobasilic fistula, 3536 units heparin bolus, Mircera 30 mcg every 2 weeks, Venofer 50 mg weekly, Hectorol 4 mcg 3 times weekly, Sensipar 90 mg 3 times weekly.  Past Medical History:  Diagnosis Date  . Anemia   . Blind left eye since ~ 2010  . Depression   . Diabetic peripheral neuropathy ([place])   . ESRD (end stage renal disease) on dialysis [facility])    "TTS; [place] [facility]; [facility]" (02/01/2016)  . Heart murmur    denies any problems with it  . Hypertension   . Seizures ([place])    "last one was end of 2016; they are related to my diabetes" (02/01/2016)  . Type 1 diabetes ([place]) dx'd 1990    Past Surgical History:  Procedure Laterality Date  . AV FISTULA PLACEMENT  Right 03/03/2015   Procedure: RIGHT RADIO-CEPHALIC ARTERIOVENOUS (AV) FISTULA CREATION;  Surgeon: Serafina Mitchell, MD;  Location: [facility] OR;  Service: Vascular;  Laterality: Right;  . AV FISTULA PLACEMENT Left 06/15/2015   Procedure: INSERTION OF LEFT UPPER ARM  ARTERIOVENOUS (AV) 64mm x 50cm GORE-TEX GRAFT;  Surgeon: Elam Dutch, MD;  Location: [place];  Service: Vascular;  Laterality: Left;  . BASCILIC VEIN TRANSPOSITION Left 04/01/2015   Procedure: BASCILIC VEIN TRANSPOSITION-LEFT ARM- FIRST STAGE;  Surgeon: Elam Dutch, MD;  Location: [place];  Service: Vascular;  Laterality: Left;  . EYE SURGERY Right ~ 2010   for diabetic retinopathy  . INSERTION OF DIALYSIS CATHETER Right 03/03/2015   Procedure: INSERTION OF DIALYSIS CATHETER;  Surgeon: Serafina Mitchell, MD;  Location: [facility] OR;  Service: Vascular;  Laterality: Right;    Family History  Problem Relation Age of Onset  . Diabetes Neg Hx     Social History:  reports that he has never smoked. He has never used smokeless tobacco. He reports that he does not drink alcohol or use drugs.  Allergies: No Known Allergies  Medications:  Scheduled: . heparin  5,000 Units Subcutaneous Q8H    BMP Latest Ref Rng & Units 02/09/2018 02/08/2018 05/08/2017  Glucose 70 - 99 mg/dL 74 270(H) 231(H)  BUN 6 - 20 mg/dL 45(H) 30(H) 48(H)  Creatinine 0.61 - 1.24 mg/dL 11.81(H) 9.46(H) 12.80(H)  Sodium 135 - 145 mmol/L 140 134(L) 137  Potassium 3.5 - 5.1 mmol/L 4.5 4.3 4.2  Chloride 98 - 111 mmol/L 97(L) 88(L) 102  CO2 22 - 32 mmol/L  29 28 -  Calcium 8.9 - 10.3 mg/dL 8.4(L) 9.0 -   CBC Latest Ref Rng & Units 02/09/2018 02/08/2018 05/08/2017  WBC 4.0 - 10.5 K/uL 4.1 4.4 -  Hemoglobin 13.0 - 17.0 g/dL 12.1(L) 12.5(L) 13.6  Hematocrit 39.0 - 52.0 % 39.4 41.0 40.0  Platelets 150 - 400 K/uL 334 278 -    Ct Head Wo Contrast  Result Date: 02/08/2018 CLINICAL DATA:  Altered level of consciousness EXAM: CT HEAD WITHOUT CONTRAST TECHNIQUE: Contiguous axial images  were obtained from the base of the skull through the vertex without intravenous contrast. COMPARISON:  01/28/2016 FINDINGS: BRAIN: The ventricles and sulci are normal. No intraparenchymal hemorrhage, mass effect nor midline shift. No acute large vascular territory infarcts. Grey-white matter distinction is maintained. The basal ganglia are unremarkable. No abnormal extra-axial fluid collections. Basal cisterns are not effaced and midline. The brainstem and cerebellar hemispheres are without acute abnormalities. VASCULAR: Unremarkable. SKULL/SOFT TISSUES: No skull fracture. No significant soft tissue swelling. ORBITS/SINUSES: Redemonstration of curvilinear coarse calcification along the posterior aspect of the left globe with what may represent chronic choroid attachment accounting for elliptical densities along the posterior aspect of the left possibly from effusions. Differential possibilities might include a chronic retinal detachment. Mastoid air cells are clear. The included paranasal sinuses are well-aerated. OTHER: None. IMPRESSION: 1. No acute intracranial abnormality. 2. 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 Electronically Signed   By: Ashley Royalty M.D.   On: 02/08/2018 14:03   Dg Chest Portable 1 View  Result Date: 02/08/2018 CLINICAL DATA:  Generalized cramping following hemodialysis therapy EXAM: PORTABLE CHEST 1 VIEW COMPARISON:  08/25/17 FINDINGS: Cardiac shadow is mildly enlarged but stable. The lungs are well aerated bilaterally. No focal infiltrate or sizable effusion is seen. No bony abnormality is noted. IMPRESSION: No active disease. Electronically Signed   By: Inez Catalina M.D.   On: 02/08/2018 13:44    Review of Systems  Unable to perform ROS: Mental status change   Blood pressure (!)  169/58, pulse 89, temperature 97.9 F (36.6 C), temperature source Oral, resp. rate 17, weight 92.1 kg, SpO2 96 %. Physical Exam  Nursing note and vitals reviewed. Constitutional: He appears well-developed and well-nourished. No distress.  HENT:  Head: Normocephalic and atraumatic.  Eyes: Pupils are equal, round, and reactive to light. Conjunctivae are normal.  Neck: Normal range of motion. No JVD present. No thyromegaly present.  Cardiovascular: Normal rate, regular rhythm and normal heart sounds.  No murmur heard. Respiratory: Effort normal and breath sounds normal. He has no wheezes. He has no rales.  GI: Soft. Bowel sounds are normal. He exhibits no distension. There is no tenderness. There is no rebound.  Musculoskeletal: He exhibits no edema.  Left brachiobasilic fistula-pulsatile  Neurological:  Somnolent/limited verbal responses  Skin: Skin is warm and dry. No erythema.    Assessment/Plan: 1.  Altered mental status: Unclear etiology and based on work-up so far, not drug-induced or from seizures.  Not convincing that this is entirely from uremic encephalopathy/disequilibrium.  Ammonia level unremarkable.  Remains lethargic when seen but able to protect airway. 2.  End-stage renal disease: He is above his dry weight but does not appear to have any evidence of rales on lung exam-we will schedule him for his dialysis again tomorrow via left BBF.  He does not have any acute indications for  dialysis at this time. 3.  Malignant hypertension: Unfortunately with a history of poor adherence with antihypertensive therapy and not willing to engage with providers at dialysis to explore treatment.  Currently on nicardipine drip and may possibly switch to oral antihypertensive agents when mentation allows safe swallowing. 4.  Anemia of chronic kidney disease: Hemoglobin/hematocrit currently at goal and without overt loss. 5.  CKD-MBD: Calcium and phosphorus levels currently at goal, will order for  Hectorol with hemodialysis tomorrow.  Lameshia Hypolite K. 02/09/2018, 11:39 AM

## 2018-02-10 LAB — BASIC METABOLIC PANEL
Anion gap: 17 — ABNORMAL HIGH (ref 5–15)
BUN: 56 mg/dL — ABNORMAL HIGH (ref 6–20)
CO2: 23 mmol/L (ref 22–32)
Calcium: 8.4 mg/dL — ABNORMAL LOW (ref 8.9–10.3)
Chloride: 100 mmol/L (ref 98–111)
Creatinine, Ser: 14.03 mg/dL — ABNORMAL HIGH (ref 0.61–1.24)
GFR calc Af Amer: 5 mL/min — ABNORMAL LOW (ref >60)
GFR calc non Af Amer: 4 mL/min — ABNORMAL LOW (ref >60)
Glucose, Bld: 73 mg/dL (ref 70–99)
Potassium: 4.6 mmol/L (ref 3.5–5.1)
Sodium: 140 mmol/L (ref 135–145)

## 2018-02-10 LAB — TRIGLYCERIDES: Triglycerides: 221 mg/dL — ABNORMAL HIGH (ref <150)

## 2018-02-10 LAB — PHOSPHORUS: Phosphorus: 5.8 mg/dL — ABNORMAL HIGH (ref 2.5–4.6)

## 2018-02-10 LAB — GLUCOSE, CAPILLARY
Glucose-Capillary: 140 mg/dL — ABNORMAL HIGH (ref 70–99)
Glucose-Capillary: 141 mg/dL — ABNORMAL HIGH (ref 70–99)
Glucose-Capillary: 225 mg/dL — ABNORMAL HIGH (ref 70–99)
Glucose-Capillary: 254 mg/dL — ABNORMAL HIGH (ref 70–99)
Glucose-Capillary: 334 mg/dL — ABNORMAL HIGH (ref 70–99)
Glucose-Capillary: 428 mg/dL — ABNORMAL HIGH (ref 70–99)
Glucose-Capillary: 44 mg/dL — CL (ref 70–99)
Glucose-Capillary: 96 mg/dL (ref 70–99)

## 2018-02-10 LAB — MAGNESIUM: MAGNESIUM: 2.3 mg/dL (ref 1.7–2.4)

## 2018-02-10 MED ORDER — NIFEDIPINE ER OSMOTIC RELEASE 30 MG PO TB24
30.0000 mg | ORAL_TABLET | Freq: Two times a day (BID) | ORAL | Status: DC
  Administered 2018-02-10 – 2018-02-15 (×11): 30 mg via ORAL
  Filled 2018-02-10 (×13): qty 1

## 2018-02-10 MED ORDER — SODIUM CHLORIDE 0.9 % IV SOLN: 100.0000 mL | INTRAVENOUS | Status: DC | PRN

## 2018-02-10 MED ORDER — LIDOCAINE-PRILOCAINE 2.5-2.5 % EX CREA
1.0000 "application " | TOPICAL_CREAM | CUTANEOUS | Status: DC | PRN
  Filled 2018-02-10: qty 5

## 2018-02-10 MED ORDER — HEPARIN SODIUM (PORCINE) 1000 UNIT/ML DIALYSIS
5000.0000 [IU] | INTRAMUSCULAR | Status: DC | PRN
  Administered 2018-02-10: 5000 [IU] via INTRAVENOUS_CENTRAL
  Filled 2018-02-10 (×2): qty 5

## 2018-02-10 MED ORDER — INSULIN ASPART 100 UNIT/ML ~~LOC~~ SOLN
12.0000 [IU] | Freq: Once | SUBCUTANEOUS | Status: AC
  Administered 2018-02-10: 12 [IU] via SUBCUTANEOUS

## 2018-02-10 MED ORDER — PENTAFLUOROPROP-TETRAFLUOROETH EX AERO: 1.0000 "application " | INHALATION_SPRAY | CUTANEOUS | Status: DC | PRN

## 2018-02-10 MED ORDER — METOPROLOL TARTRATE 25 MG PO TABS
25.0000 mg | ORAL_TABLET | Freq: Two times a day (BID) | ORAL | Status: DC
  Administered 2018-02-10 – 2018-02-11 (×2): 25 mg via ORAL
  Filled 2018-02-10 (×2): qty 1

## 2018-02-10 MED ORDER — LIDOCAINE HCL (PF) 1 % IJ SOLN: 5.0000 mL | INTRAMUSCULAR | Status: DC | PRN

## 2018-02-10 MED ORDER — INSULIN GLARGINE 100 UNIT/ML ~~LOC~~ SOLN
12.0000 [IU] | Freq: Every day | SUBCUTANEOUS | Status: DC
  Administered 2018-02-10 – 2018-02-11 (×2): 12 [IU] via SUBCUTANEOUS
  Filled 2018-02-10 (×3): qty 0.12

## 2018-02-10 NOTE — Procedures (Signed)
Patient seen on Hemodialysis. BP (!) 175/81   Pulse 94   Temp 98 F (36.7 C) (Axillary)   Resp 19   Wt 93.9 kg   SpO2 99%   BMI 28.87 kg/m   QB 400, UF goal 5L Tolerating treatment without complaints at this time.   Elmarie Shiley MD [facility]. Office # [phone] Pager # [phone] 11:16 AM

## 2018-02-10 NOTE — Progress Notes (Signed)
Patient's blood glucose 428. [place] RN notified. No sliding scale coverage to give because the patient is on a sensitive scale. RN waiting for MD return call.

## 2018-02-10 NOTE — Progress Notes (Signed)
NAME:  Edward Colon, MRN:  [id number], DOB:  June 08, 1980, LOS: 2 ADMISSION DATE:  02/08/2018, CONSULTATION DATE:  12/5 REFERRING MD:  EDP, CHIEF COMPLAINT:  AMS    Brief History   37 year old male with history of ESRD on HD, type 1 diabetes, hypertension, history of seizures secondary to hypoglycemia admitted on 12/5 with AMS and hypertensive emergency. PCCM called for ICU admission.  Mental status improved without intervention.  On Cleviprex drip for hypertension.   Past Medical History   has a past medical history of Anemia, Blind left eye (since ~ 2010), Depression, Diabetic peripheral neuropathy ([place]), ESRD (end stage renal disease) on dialysis ([place]), Heart murmur, Hypertension, Seizures ([place]), and Type 1 diabetes ([place]) (dx'd 1990).   Significant Hospital Events   12/6 Admitted for encephalopathy  Consults:  Neuro 12/5>>>12/6 Nephrology 12/6  Procedures:  12/7 Dialysis  Significant Diagnostic Tests:  EEG 12/5>> No seizure activity present in study CT head 12/5>>> Negative for acute abnormality MRI head 12/5>>>Cancelled  Micro Data:  -None  Antimicrobials:   -None  Interim history/subjective:  Started on long acting insulin yesterday. Hypoglycemic to 44 this am. Awake, denies confusion.  On Cleviprex.  On HD.  Denies headaches dizziness nausea.  Good appetite.  Objective   Blood pressure (!) 157/67, pulse 91, temperature 98 F (36.7 C), temperature source Axillary, resp. rate 12, weight 93.9 kg, SpO2 97 %.        Intake/Output Summary (Last 24 hours) at 02/10/2018 0932 Last data filed at 02/10/2018 0700 Gross per 24 hour  Intake 863.59 ml  Output -  Net 863.59 ml   Filed Weights   02/09/18 0500 02/10/18 0500  Weight: 92.1 kg 93.9 kg    Examination: General: NAD, laying in bed, receiving hemodialysis  HENT: EOMI, no scleral icterus Lungs: CTA B, no wheezes or rhonchi Cardiovascular: RRR, systolic ejection murmur present  Abdomen: Soft, and TTP, BS  + Extremities: LUE AVF attached to hemodialysis  neuro: AAO x4, CN II to XII grossly intact Psych: Normal mood and affect   Resolved Hospital Problem list   Altered mental status  Assessment & Plan:    #Insulin-dependent DM Type 1 c/b peripheral nephrology, left eye blindness #Severe Hypoglycemia -Reduce Lantus 12 nightly.  Appreciate INPT DM recommendations --Sensitive sliding scale -CBG q4h and as needed for suspected hypoglycemia -Dex50 PRN for hypoglycemia  #HTN Urgency-uncontrolled Non-adherent due to not believing his medications were effective. -Continue Cleviprex Ggt to maintain SBP<160 -Continue home metoprolol -Dialysis per nephrology  #Altered mental status-improved On admission, patient obtunded. Unclear etiology though he has episodes of severe hypoglycemia while inpatient. Consider metabolic vs hypertensive encephalopathy. Hx of seizure-like activity secondary to hypoglycemia.  EEG negative for seizure activity. -MRI is no longer indicated per Neurology -DM management as noted    #ESRD secondary to DM Last dialysis session 12/5 with removal of 4.8L. Hx of missing dialysis recently including the week prior to this admission. -Nephrology following -Dialysis today -BMP, Mg, Phos daily   Best practice:  Diet: Diabetic Diet Pain/Anxiety/Delirium protocol (if indicated): n/a VAP protocol (if indicated): n/a DVT prophylaxis: SQ heparin  GI prophylaxis: n/a Glucose control: lantus  Mobility: bedrest Code Status: full  Family Communication: No family at bedside Disposition: Remain in ICU   Labs   CBC: Recent Labs  Lab 02/08/18 1139 02/09/18 0353  WBC 4.4 4.1  NEUTROABS 2.8  --   HGB 12.5* 12.1*  HCT 41.0 39.4  MCV 90.1 88.5  PLT 278 334  Basic Metabolic Panel: Recent Labs  Lab 02/08/18 1139 02/09/18 0353 02/10/18 0634  NA 134* 140 140  K 4.3 4.5 4.6  CL 88* 97* 100  CO2 28 29 23   GLUCOSE 270* 74 73  BUN 30* 45* 56*  CREATININE 9.46*  11.81* 14.03*  CALCIUM 9.0 8.4* 8.4*  MG  --   --  2.3  PHOS  --  5.0* 5.8*   GFR: CrCl cannot be calculated (Unknown ideal weight.). Recent Labs  Lab 02/08/18 1139 02/09/18 0353  WBC 4.4 4.1    Liver Function Tests: Recent Labs  Lab 02/08/18 1139 02/09/18 0353  AST 27  --   ALT 34  --   ALKPHOS 94  --   BILITOT 1.2  --   PROT 9.2*  --   ALBUMIN 4.2 3.1*   No results for input(s): LIPASE, AMYLASE in the last 168 hours. Recent Labs  Lab 02/08/18 1818  AMMONIA 45*   HbA1C: Hemoglobin A1C  Date/Time Value Ref Range Status  12/14/2016 10:37 AM 9.7  Final  04/11/2016 10:43 AM 9.7  Final   Hgb A1c MFr Bld  Date/Time Value Ref Range Status  08/31/2014 04:42 AM 10.3 (H) 4.8 - 5.6 % Final    Comment:    (NOTE)         Pre-diabetes: 5.7 - 6.4         Diabetes: >6.4         Glycemic control for adults with diabetes: <7.0   07/18/2013 02:58 PM 7.9 (H) <5.7 % Final    Comment:    (NOTE)                                                                       According to the ADA Clinical Practice Recommendations for 2011, when HbA1c is used as a screening test:  >=6.5%   Diagnostic of Diabetes Mellitus           (if abnormal result is confirmed) 5.7-6.4%   Increased risk of developing Diabetes Mellitus References:Diagnosis and Classification of Diabetes Mellitus,Diabetes [id number],9983,38(SNKNL 1):S62-S69 and Standards of Medical Care in         Diabetes - 2011,Diabetes Care,2011,34 (Suppl 1):S11-S61.    CBG: Recent Labs  Lab 02/09/18 1511 02/10/18 0013 02/10/18 0341 02/10/18 0748 02/10/18 0819  GLUCAP 124* 254* 140* 44* 96   The patient is critically ill with multiple organ systems failure and requires high complexity decision making for assessment and support, frequent evaluation and titration of therapies, application of advanced monitoring technologies and extensive interpretation of multiple databases.   Critical Care Time devoted to patient care services  described in this note is  31 Minutes. This time reflects time of care of this signee Dr. Rodman Pickle. This critical care time does not reflect procedure time, or teaching time or supervisory time of PA/NP/Med student/Med Resident etc but could involve care discussion time.  Rodman Pickle, M.D. [facility] Pulmonary/Critical Care Medicine. Pager: [phone]. After hours pager: [phone].

## 2018-02-10 NOTE — Progress Notes (Signed)
Patient ID: Edward Colon, male   DOB: 1980/03/30, 37 y.o.   MRN: [id number]  [facility] Progress Note   Assessment/ Plan:   1.  Altered mental status:  Suspected to be from accelerated hypertension with or without component of seizure-not quite convincing from uremia or disequilibrium.  Mental status now has improved back to his baseline. 2.  End-stage renal disease: Hemodialysis today on his usual outpatient TTS schedule with efforts at challenging his dry weight.. 3.  Malignant hypertension: Unfortunately with a history of poor adherence with antihypertensive therapy-start him on nifedipine XL 30 mg twice a day along with increased metoprolol 25 mg twice a day to wean off of nicardipine drip. 4.  Anemia of chronic kidney disease: Hemoglobin/hematocrit currently at goal and without overt loss. 5.  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  Subjective:   Reports to be feeling well, denies chest pain or shortness of breath   Objective:   BP (!) 175/81   Pulse 94   Temp 98 F (36.7 C) (Axillary)   Resp 19   Wt 93.9 kg   SpO2 99%   BMI 28.87 kg/m   Physical Exam: Gen: Comfortably resting in bed, watching show on phone CVS: Pulse regular rhythm, normal rate, S1 and S2 normal Resp: Clear to auscultation, no rales/rhonchi Abd: Soft, flat, nontender Ext: Trace lower extremity edema  Labs: BMET Recent Labs  Lab 02/08/18 1139 02/09/18 0353 02/10/18 0634  NA 134* 140 140  K 4.3 4.5 4.6  CL 88* 97* 100  CO2 28 29 23   GLUCOSE 270* 74 73  BUN 30* 45* 56*  CREATININE 9.46* 11.81* 14.03*  CALCIUM 9.0 8.4* 8.4*  PHOS  --  5.0* 5.8*   CBC Recent Labs  Lab 02/08/18 1139 02/09/18 0353  WBC 4.4 4.1  NEUTROABS 2.8  --   HGB 12.5* 12.1*  HCT 41.0 39.4  MCV 90.1 88.5  PLT 278 334   Medications:    . Chlorhexidine Gluconate Cloth  6 each Topical Q0600  . heparin  5,000 Units Subcutaneous Q8H  . insulin  aspart  0-9 Units Subcutaneous Q4H  . insulin glargine  12 Units Subcutaneous QHS  . metoprolol tartrate  25 mg Oral q morning - 10a   Elmarie Shiley, MD 02/10/2018, 11:09 AM

## 2018-02-10 NOTE — Progress Notes (Signed)
[place] Progress Note Patient Name: MANDELL PANGBORN DOB: Dec 20, 1980 MRN: [id number]   Date of Service  02/10/2018  HPI/Events of Note  Hyperglycemia - Blood glucose = 428. Hx of brittle DM.   eICU Interventions  Will order: 1. Novolog insulin 12 units [place] now.         [place],Steven Eugene 02/10/2018, 8:20 PM

## 2018-02-10 NOTE — Progress Notes (Addendum)
CBG 44. 12.5 grams dextrose (1/2 amp) given, pt eating breakfast as well.   Recheck 96. (at [phone])

## 2018-02-11 DIAGNOSIS — N186 End stage renal disease: Secondary | ICD-10-CM

## 2018-02-11 DIAGNOSIS — Z992 Dependence on renal dialysis: Secondary | ICD-10-CM

## 2018-02-11 LAB — BASIC METABOLIC PANEL
Anion gap: 16 — ABNORMAL HIGH (ref 5–15)
BUN: 33 mg/dL — ABNORMAL HIGH (ref 6–20)
CO2: 25 mmol/L (ref 22–32)
Calcium: 8.4 mg/dL — ABNORMAL LOW (ref 8.9–10.3)
Chloride: 91 mmol/L — ABNORMAL LOW (ref 98–111)
Creatinine, Ser: 9.49 mg/dL — ABNORMAL HIGH (ref 0.61–1.24)
GFR calc Af Amer: 7 mL/min — ABNORMAL LOW (ref >60)
GFR calc non Af Amer: 6 mL/min — ABNORMAL LOW (ref >60)
Glucose, Bld: 331 mg/dL — ABNORMAL HIGH (ref 70–99)
Potassium: 4.9 mmol/L (ref 3.5–5.1)
SODIUM: 132 mmol/L — AB (ref 135–145)

## 2018-02-11 LAB — GLUCOSE, CAPILLARY
GLUCOSE-CAPILLARY: 122 mg/dL — AB (ref 70–99)
Glucose-Capillary: 128 mg/dL — ABNORMAL HIGH (ref 70–99)
Glucose-Capillary: 192 mg/dL — ABNORMAL HIGH (ref 70–99)
Glucose-Capillary: 233 mg/dL — ABNORMAL HIGH (ref 70–99)
Glucose-Capillary: 262 mg/dL — ABNORMAL HIGH (ref 70–99)
Glucose-Capillary: 334 mg/dL — ABNORMAL HIGH (ref 70–99)

## 2018-02-11 LAB — PHOSPHORUS: Phosphorus: 5.1 mg/dL — ABNORMAL HIGH (ref 2.5–4.6)

## 2018-02-11 LAB — MAGNESIUM: Magnesium: 2.2 mg/dL (ref 1.7–2.4)

## 2018-02-11 MED ORDER — ACETAMINOPHEN 325 MG PO TABS
650.0000 mg | ORAL_TABLET | Freq: Four times a day (QID) | ORAL | Status: DC | PRN
  Administered 2018-02-11 – 2018-02-13 (×2): 650 mg via ORAL
  Filled 2018-02-11 (×2): qty 2

## 2018-02-11 MED ORDER — SPIRONOLACTONE 50 MG PO TABS
50.0000 mg | ORAL_TABLET | Freq: Every day | ORAL | Status: DC
  Administered 2018-02-11 – 2018-02-15 (×5): 50 mg via ORAL
  Filled 2018-02-11 (×5): qty 1

## 2018-02-11 MED ORDER — NAPHAZOLINE-GLYCERIN 0.012-0.2 % OP SOLN
1.0000 [drp] | Freq: Four times a day (QID) | OPHTHALMIC | Status: DC | PRN
  Administered 2018-02-11: 2 [drp] via OPHTHALMIC
  Filled 2018-02-11: qty 15

## 2018-02-11 MED ORDER — METOPROLOL TARTRATE 50 MG PO TABS
50.0000 mg | ORAL_TABLET | Freq: Two times a day (BID) | ORAL | Status: DC
  Administered 2018-02-11 – 2018-02-15 (×7): 50 mg via ORAL
  Filled 2018-02-11 (×7): qty 1

## 2018-02-11 MED ORDER — HYDRALAZINE HCL 10 MG PO TABS
10.0000 mg | ORAL_TABLET | Freq: Three times a day (TID) | ORAL | Status: DC
  Administered 2018-02-11: 10 mg via ORAL
  Filled 2018-02-11 (×2): qty 1

## 2018-02-11 NOTE — Plan of Care (Signed)

## 2018-02-11 NOTE — Progress Notes (Signed)
NAME:  Edward Colon, MRN:  [id number], DOB:  1980-07-25, LOS: 3 ADMISSION DATE:  02/08/2018, CONSULTATION DATE:  12/5 REFERRING MD:  EDP, CHIEF COMPLAINT:  AMS    Brief History   37 year old male with history of ESRD on HD, type 1 diabetes, hypertension, history of seizures secondary to hypoglycemia admitted on 12/5 with AMS and hypertensive emergency. PCCM called for ICU admission.  Mental status improved without intervention.  On Cleviprex drip for hypertension.   Past Medical History   has a past medical history of Anemia, Blind left eye (since ~ 2010), Depression, Diabetic peripheral neuropathy ([place]), ESRD (end stage renal disease) on dialysis ([place]), Heart murmur, Hypertension, Seizures ([place]), and Type 1 diabetes ([place]) (dx'd 1990).   Significant Hospital Events   12/6 Admitted for encephalopathy  Consults:  Neuro 12/5>>>12/6 Nephrology 12/6  Procedures:  12/7 Dialysis  Significant Diagnostic Tests:  EEG 12/5>> No seizure activity present in study CT head 12/5>>> Negative for acute abnormality MRI head 12/5>>>Cancelled  Micro Data:  -None  Antimicrobials:   -None  Interim history/subjective:  No acute change overnight. C/o mild headache otherwise no c/o.  Remains on cleviprex. Awake and answers appropriately but does take a bit of prompting at times to open eyes or answer questions.    Objective   Blood pressure (!) 158/76, pulse 74, temperature 98.1 F (36.7 C), temperature source Oral, resp. rate 15, weight 89.3 kg, SpO2 96 %.        Intake/Output Summary (Last 24 hours) at 02/11/2018 1447 Last data filed at 02/11/2018 1200 Gross per 24 hour  Intake 330.16 ml  Output 4801 ml  Net -4470.84 ml   Filed Weights   02/10/18 1100 02/10/18 1511 02/11/18 0500  Weight: 94.4 kg 89.3 kg 89.3 kg    Examination: General: NAD, laying in bed HENT: EOMI, no scleral icterus Lungs: resps even non labored on RA, clear  Cardiovascular: RRR, systolic ejection murmur  present  Abdomen: soft, non tender  Extremities: warm and dry, no edema,  LUE AVF  neuro: AAO x4, CN II to XII grossly intact Psych: Normal mood and affect   Resolved Hospital Problem list   Altered mental status  Assessment & Plan:   Insulin-dependent DM Type 1 c/b peripheral nephrology, left eye blindness Severe Hypoglycemia PLAN -  Continue lantus 12units qhs  SSI    HTN Urgency - uncontrolled  Non-adherent due to not believing his medications were effective. PLAN -  Continue Cleviprex Ggt to maintain SBP<160 and wean as able  Aldactone, nifedipine added 12/8 Continue home metoprolol - increased to 50mg  BID by renal - hopefully can further wean cleviprex  Dialysis per nephrology  AMS - improved. Likely r/t HTN urgency, metabolic derangements +/- hypoglycemia  Hx of seizure-like activity secondary to hypoglycemia.  EEG negative for seizure activity. PLAN -  DM management as above  Supportive care      ESRD secondary to DM  PLAN -  Renal following  Had HD 12/7 F/u chem    Hope to wean off cleviprex gtt with addition of multiple anti-HTN 12/8 Can tx out of ICU once BP remains stable remains off gtt  Best practice:  Diet: Diabetic Diet Pain/Anxiety/Delirium protocol (if indicated): n/a VAP protocol (if indicated): n/a DVT prophylaxis: SQ heparin  GI prophylaxis: n/a Glucose control: lantus  Mobility: bedrest Code Status: full  Family Communication: No family at bedside Disposition: Remain in ICU while on cleviprex gtt   Labs   CBC: Recent Labs  Lab  02/08/18 1139 02/09/18 0353  WBC 4.4 4.1  NEUTROABS 2.8  --   HGB 12.5* 12.1*  HCT 41.0 39.4  MCV 90.1 88.5  PLT 278 026    Basic Metabolic Panel: Recent Labs  Lab 02/08/18 1139 02/09/18 0353 02/10/18 0634 02/11/18 0116  NA 134* 140 140 132*  K 4.3 4.5 4.6 4.9  CL 88* 97* 100 91*  CO2 28 29 23 25   GLUCOSE 270* 74 73 331*  BUN 30* 45* 56* 33*  CREATININE 9.46* 11.81* 14.03* 9.49*  CALCIUM  9.0 8.4* 8.4* 8.4*  MG  --   --  2.3 2.2  PHOS  --  5.0* 5.8* 5.1*   GFR: CrCl cannot be calculated (Unknown ideal weight.). Recent Labs  Lab 02/08/18 1139 02/09/18 0353  WBC 4.4 4.1    Liver Function Tests: Recent Labs  Lab 02/08/18 1139 02/09/18 0353  AST 27  --   ALT 34  --   ALKPHOS 94  --   BILITOT 1.2  --   PROT 9.2*  --   ALBUMIN 4.2 3.1*   No results for input(s): LIPASE, AMYLASE in the last 168 hours. Recent Labs  Lab 02/08/18 1818  AMMONIA 45*   HbA1C: Hemoglobin A1C  Date/Time Value Ref Range Status  12/14/2016 10:37 AM 9.7  Final  04/11/2016 10:43 AM 9.7  Final   Hgb A1c MFr Bld  Date/Time Value Ref Range Status  08/31/2014 04:42 AM 10.3 (H) 4.8 - 5.6 % Final    Comment:    (NOTE)         Pre-diabetes: 5.7 - 6.4         Diabetes: >6.4         Glycemic control for adults with diabetes: <7.0   07/18/2013 02:58 PM 7.9 (H) <5.7 % Final    Comment:    (NOTE)                                                                       According to the ADA Clinical Practice Recommendations for 2011, when HbA1c is used as a screening test:  >=6.5%   Diagnostic of Diabetes Mellitus           (if abnormal result is confirmed) 5.7-6.4%   Increased risk of developing Diabetes Mellitus References:Diagnosis and Classification of Diabetes Mellitus,Diabetes VZCH,8850,27(XAJOI 1):S62-S69 and Standards of Medical Care in         Diabetes - 2011,Diabetes Care,2011,34 (Suppl 1):S11-S61.    CBG: Recent Labs  Lab 02/10/18 1925 02/10/18 2342 02/11/18 0402 02/11/18 0731 02/11/18 1128  GLUCAP 428* 334* 122* 128* 334*     Edward Madrid, NP 02/11/2018  2:47 PM Pager: [phone] or [phone]

## 2018-02-11 NOTE — Progress Notes (Signed)
Patient ID: Edward Colon, male   DOB: [birth year], 37 y.o.   MRN: [id number]  [facility] Progress Note   Assessment/ Plan:   1.  Altered mental status:  Likely from accelerated hypertension with or without component of seizure-not from uremia/disequilibrium.  Mental status now has improved back to his baseline. 2.  End-stage renal disease: Hemodialysis today on his usual outpatient TTS schedule with efforts at challenging his dry weight.  He does not have any acute indications for dialysis today. 3.  Malignant hypertension: Unfortunately with a history of poor adherence with antihypertensive therapy-remains on Cardene drip; I will increase his metoprolol to 50 mg twice daily and add spironolactone 50 mg daily in addition to nifedipine XL 30 mg twice daily to try and get him off of nicardipine drip. 4.  Anemia of chronic kidney disease: Hemoglobin/hematocrit currently at goal and without overt loss. 5.  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  Subjective:   Reports to be feeling tired this morning-denies any chest pain or shortness of breath    Objective:   BP (!) 167/76   Pulse 81   Temp 98.1 F (36.7 C) (Oral)   Resp (!) 22   Wt 89.3 kg   SpO2 100%   BMI 27.46 kg/m   Physical Exam: Gen: Sleeping comfortably in bed, awakens to calling out his name CVS: Pulse regular rhythm, normal rate, S1 and S2 normal Resp: Clear to auscultation, no rales/rhonchi Abd: Soft, flat, nontender Ext: No lower extremity edema  Labs: BMET Recent Labs  Lab 02/08/18 1139 02/09/18 0353 02/10/18 0634 02/11/18 0116  NA 134* 140 140 132*  K 4.3 4.5 4.6 4.9  CL 88* 97* 100 91*  CO2 28 29 23 25   GLUCOSE 270* 74 73 331*  BUN 30* 45* 56* 33*  CREATININE 9.46* 11.81* 14.03* 9.49*  CALCIUM 9.0 8.4* 8.4* 8.4*  PHOS  --  5.0* 5.8* 5.1*   CBC Recent Labs  Lab 02/08/18 1139 02/09/18 0353  WBC 4.4 4.1  NEUTROABS 2.8  --   HGB  12.5* 12.1*  HCT 41.0 39.4  MCV 90.1 88.5  PLT 278 334   Medications:    . Chlorhexidine Gluconate Cloth  6 each Topical Q0600  . heparin  5,000 Units Subcutaneous Q8H  . insulin aspart  0-9 Units Subcutaneous Q4H  . insulin glargine  12 Units Subcutaneous QHS  . metoprolol tartrate  25 mg Oral BID  . NIFEdipine  30 mg Oral BID   Elmarie Shiley, MD 02/11/2018, 10:32 AM

## 2018-02-12 DIAGNOSIS — I10 Essential (primary) hypertension: Secondary | ICD-10-CM

## 2018-02-12 LAB — CBC
HCT: 36.9 % — ABNORMAL LOW (ref 39.0–52.0)
Hemoglobin: 11.7 g/dL — ABNORMAL LOW (ref 13.0–17.0)
MCH: 28 pg (ref 26.0–34.0)
MCHC: 31.7 g/dL (ref 30.0–36.0)
MCV: 88.3 fL (ref 80.0–100.0)
Platelets: 349 10*3/uL (ref 150–400)
RBC: 4.18 MIL/uL — ABNORMAL LOW (ref 4.22–5.81)
RDW: 13 % (ref 11.5–15.5)
WBC: 5.1 10*3/uL (ref 4.0–10.5)
nRBC: 0 % (ref 0.0–0.2)

## 2018-02-12 LAB — PHOSPHORUS: Phosphorus: 7.2 mg/dL — ABNORMAL HIGH (ref 2.5–4.6)

## 2018-02-12 LAB — GLUCOSE, CAPILLARY
GLUCOSE-CAPILLARY: 114 mg/dL — AB (ref 70–99)
Glucose-Capillary: 263 mg/dL — ABNORMAL HIGH (ref 70–99)
Glucose-Capillary: 47 mg/dL — ABNORMAL LOW (ref 70–99)
Glucose-Capillary: 53 mg/dL — ABNORMAL LOW (ref 70–99)
Glucose-Capillary: 84 mg/dL (ref 70–99)
Glucose-Capillary: 134 mg/dL — ABNORMAL HIGH (ref 70–99)
Glucose-Capillary: 158 mg/dL — ABNORMAL HIGH (ref 70–99)
Glucose-Capillary: 198 mg/dL — ABNORMAL HIGH (ref 70–99)
Glucose-Capillary: 151 mg/dL — ABNORMAL HIGH (ref 70–99)
Glucose-Capillary: 17 mg/dL — CL (ref 70–99)

## 2018-02-12 LAB — BASIC METABOLIC PANEL
Anion gap: 14 (ref 5–15)
BUN: 47 mg/dL — ABNORMAL HIGH (ref 6–20)
CO2: 27 mmol/L (ref 22–32)
Calcium: 8.8 mg/dL — ABNORMAL LOW (ref 8.9–10.3)
Chloride: 99 mmol/L (ref 98–111)
Creatinine, Ser: 12.75 mg/dL — ABNORMAL HIGH (ref 0.61–1.24)
GFR calc non Af Amer: 4 mL/min — ABNORMAL LOW (ref >60)
GFR, EST AFRICAN AMERICAN: 5 mL/min — AB (ref >60)
Glucose, Bld: 49 mg/dL — ABNORMAL LOW (ref 70–99)
Potassium: 4.8 mmol/L (ref 3.5–5.1)
Sodium: 140 mmol/L (ref 135–145)

## 2018-02-12 LAB — MAGNESIUM: Magnesium: 2.4 mg/dL (ref 1.7–2.4)

## 2018-02-12 MED ORDER — CHLORHEXIDINE GLUCONATE CLOTH 2 % EX PADS
6.0000 | MEDICATED_PAD | Freq: Every day | CUTANEOUS | Status: DC
  Administered 2018-02-13 – 2018-02-14 (×2): 6 via TOPICAL

## 2018-02-12 MED ORDER — INSULIN GLARGINE 100 UNIT/ML ~~LOC~~ SOLN
8.0000 [IU] | Freq: Every day | SUBCUTANEOUS | Status: DC
  Administered 2018-02-13 (×2): 8 [IU] via SUBCUTANEOUS
  Filled 2018-02-12 (×3): qty 0.08

## 2018-02-12 MED ORDER — DEXTROSE 50 % IV SOLN
INTRAVENOUS | Status: AC
  Filled 2018-02-12: qty 50

## 2018-02-12 MED ORDER — INSULIN ASPART 100 UNIT/ML ~~LOC~~ SOLN
0.0000 [IU] | Freq: Three times a day (TID) | SUBCUTANEOUS | Status: DC
  Administered 2018-02-12 (×2): 2 [IU] via SUBCUTANEOUS

## 2018-02-12 MED ORDER — ISOSORB DINITRATE-HYDRALAZINE 20-37.5 MG PO TABS
1.0000 | ORAL_TABLET | Freq: Three times a day (TID) | ORAL | Status: DC
  Administered 2018-02-12 – 2018-02-15 (×10): 1 via ORAL
  Filled 2018-02-12 (×13): qty 1

## 2018-02-12 MED ORDER — GLUCOSE 4 G PO CHEW
CHEWABLE_TABLET | ORAL | Status: AC
  Administered 2018-02-12: 1
  Filled 2018-02-12: qty 1

## 2018-02-12 MED ORDER — CIPROFLOXACIN HCL 0.3 % OP SOLN
1.0000 [drp] | Freq: Four times a day (QID) | OPHTHALMIC | Status: DC
  Administered 2018-02-14 – 2018-02-15 (×4): 2 [drp] via OPHTHALMIC
  Filled 2018-02-12 (×2): qty 2.5

## 2018-02-12 MED ORDER — INSULIN ASPART 100 UNIT/ML ~~LOC~~ SOLN
3.0000 [IU] | Freq: Three times a day (TID) | SUBCUTANEOUS | Status: DC
  Administered 2018-02-12 (×2): 3 [IU] via SUBCUTANEOUS

## 2018-02-12 MED ORDER — CIPROFLOXACIN HCL 0.3 % OP SOLN
1.0000 [drp] | OPHTHALMIC | Status: AC
  Administered 2018-02-12 (×4): 2 [drp] via OPHTHALMIC
  Administered 2018-02-12: 1 [drp] via OPHTHALMIC
  Administered 2018-02-13 – 2018-02-14 (×6): 2 [drp] via OPHTHALMIC
  Filled 2018-02-12: qty 2.5

## 2018-02-12 MED ORDER — DEXTROSE 50 % IV SOLN
1.0000 | Freq: Once | INTRAVENOUS | Status: AC
  Administered 2018-02-12: 50 mL via INTRAVENOUS

## 2018-02-12 NOTE — Progress Notes (Signed)
At shift change the patient's parents arrived and once they walked into the room they noticed the patient extremely diaphoretic and with an altered mental status. CBG was immediately checked and it was 17. 1 amp of D50 was given. Elink was notified of the situation at which point they did camera in.  The patient started to wake up within a couple of minutes and was completely A&Ox4 within 5 min of receiving the D50.   It was explained to the parents that I and the NT just checked on the patient about 45 minutes ago and he was sitting up in the bed completely alert, oriented and told me he was "doing fine and didn't need anything". The patient's parents made the comment that "this sort of thing just happens and when it does it happens so fast". They also stated that "his sugars have always been so hard to control". Both the patient and the parents thanked the staff for attending to the situation.   Event details passed onto the night RN who will continue to monitor.

## 2018-02-12 NOTE — Progress Notes (Addendum)
NAME:  Edward Colon, MRN:  [id number], DOB:  11/07/1980, LOS: 4 ADMISSION DATE:  02/08/2018, CONSULTATION DATE:  12/5 REFERRING MD:  EDP, CHIEF COMPLAINT:  AMS    Brief History   37 year old male with history of ESRD on HD, type 1 diabetes, hypertension, history of seizures secondary to hypoglycemia admitted on 12/5 with AMS and hypertensive emergency. PCCM called for ICU admission.  Mental status improved without intervention.  On Cleviprex drip for hypertension.   Past Medical History   has a past medical history of Anemia, Blind left eye (since ~ 2010), Depression, Diabetic peripheral neuropathy ([place]), ESRD (end stage renal disease) on dialysis ([place]), Heart murmur, Hypertension, Seizures ([place]), and Type 1 diabetes ([place]) (dx'd 1990).  Significant Hospital Events   12/6 Admitted for encephalopathy  Consults:  Neuro 12/5 > 12/6 Nephrology 12/6  Procedures:  12/7 Dialysis  Significant Diagnostic Tests:  EEG 12/5>> No seizure activity present in study CT head 12/5>>> Negative for acute abnormality MRI head 12/5>>>Cancelled  Micro Data:  -None  Antimicrobials:   -None  Interim history/subjective:  L eye pain and irritation, but better than it was. Was wondering about seeing and eye doctor. Remains on cleviprex a 9  Objective   Blood pressure 139/68, pulse 74, temperature 98.4 F (36.9 C), temperature source Oral, resp. rate 14, weight 92.2 kg, SpO2 97 %.        Intake/Output Summary (Last 24 hours) at 02/12/2018 0907 Last data filed at 02/12/2018 0600 Gross per 24 hour  Intake 345.09 ml  Output -  Net 345.09 ml   Filed Weights   02/10/18 1511 02/11/18 0500 02/12/18 0500  Weight: 89.3 kg 89.3 kg 92.2 kg    Examination: General: young adult male in NAD HENT: [place]/AT. L pupil irregular. L corneal irritation.  Lungs: Clear bilateral breath sounds, resps even unlabored. Cardiovascular: RRR, systolic ejection murmur present  Abdomen: soft, non tender, non-distended    Extremities: warm and dry, no edema,  LUE AVF  neuro: alert, oriented, non-focal. Pleasant, very easy-going.   Resolved Hospital Problem list   Altered mental status  Assessment & Plan:   Insulin-dependent DM Type 1 c/b peripheral nephropathy, left eye blindness Severe Hypoglycemia - Hypoglycemic overnight. Hyperglycemic yesterday evening. Decrease lantus. Will ask DM coordinator to evaluate and make recommendations.  - SSI   HTN Urgency -Non-adherent with  med regimen. Believed his medications were ineffective. - Continue Cleviprex ggt to maintain SBP < 134mmHg and wean as able  - Continue aldactone, nifedipine, metoprolol - HD per nephrology - Start bidil one tab TID - Consider resistant hypertension workup.     ESRD secondary to DM  - Renal following  - Had HD 12/7 - F/u chem   L eye irritation. Describes eye pain and drainage. This is the eye that he is blind in.  - continue OTC redness drops - Will order ciprofloxacin ophthalmic - If no relief may need to involve opthamology  Best practice:  Diet: Diabetic Diet Pain/Anxiety/Delirium protocol (if indicated): n/a VAP protocol (if indicated): n/a DVT prophylaxis: SQ heparin GI prophylaxis: n/a Glucose control: lantus, SSI Mobility: Bedrest Code Status: full Family Communication: No family at bedside Disposition: Remain in ICU while on cleviprex gtt  Labs   CBC: Recent Labs  Lab 02/08/18 1139 02/09/18 0353 02/12/18 0411  WBC 4.4 4.1 5.1  NEUTROABS 2.8  --   --   HGB 12.5* 12.1* 11.7*  HCT 41.0 39.4 36.9*  MCV 90.1 88.5 88.3  PLT 278 334  016    Basic Metabolic Panel: Recent Labs  Lab 02/08/18 1139 02/09/18 0353 02/10/18 0634 02/11/18 0116 02/12/18 0411  NA 134* 140 140 132* 140  K 4.3 4.5 4.6 4.9 4.8  CL 88* 97* 100 91* 99  CO2 28 29 23 25 27   GLUCOSE 270* 74 73 331* 49*  BUN 30* 45* 56* 33* 47*  CREATININE 9.46* 11.81* 14.03* 9.49* 12.75*  CALCIUM 9.0 8.4* 8.4* 8.4* 8.8*  MG  --   --  2.3  2.2 2.4  PHOS  --  5.0* 5.8* 5.1* 7.2*   GFR: CrCl cannot be calculated (Unknown ideal weight.). Recent Labs  Lab 02/08/18 1139 02/09/18 0353 02/12/18 0411  WBC 4.4 4.1 5.1    Liver Function Tests: Recent Labs  Lab 02/08/18 1139 02/09/18 0353  AST 27  --   ALT 34  --   ALKPHOS 94  --   BILITOT 1.2  --   PROT 9.2*  --   ALBUMIN 4.2 3.1*   No results for input(s): LIPASE, AMYLASE in the last 168 hours. Recent Labs  Lab 02/08/18 1818  AMMONIA 45*   HbA1C: Hemoglobin A1C  Date/Time Value Ref Range Status  12/14/2016 10:37 AM 9.7  Final  04/11/2016 10:43 AM 9.7  Final   Hgb A1c MFr Bld  Date/Time Value Ref Range Status  08/31/2014 04:42 AM 10.3 (H) 4.8 - 5.6 % Final    Comment:    (NOTE)         Pre-diabetes: 5.7 - 6.4         Diabetes: >6.4         Glycemic control for adults with diabetes: <7.0   07/18/2013 02:58 PM 7.9 (H) <5.7 % Final    Comment:    (NOTE)                                                                       According to the ADA Clinical Practice Recommendations for 2011, when HbA1c is used as a screening test:  >=6.5%   Diagnostic of Diabetes Mellitus           (if abnormal result is confirmed) 5.7-6.4%   Increased risk of developing Diabetes Mellitus References:Diagnosis and Classification of Diabetes Mellitus,Diabetes WFUX,3235,57(DUKGU 1):S62-S69 and Standards of Medical Care in         Diabetes - 2011,Diabetes Care,2011,34 (Suppl 1):S11-S61.    CBG: Recent Labs  Lab 02/11/18 2335 02/12/18 0414 02/12/18 0447 02/12/18 0515 02/12/18 0804  GLUCAP 262* 47* 53* 84 134*    My critical care time excluding procedures: 35 minutes   Georgann Housekeeper, AGACNP-BC [place] Pager [phone] or [phone]  02/12/2018 9:20 AM

## 2018-02-12 NOTE — Progress Notes (Addendum)
Patient ID: Edward Colon, male   DOB: Jan 03, 1981, 37 y.o.   MRN: [id number]  [facility] Progress Note   Assessment/ Plan:   1.  Altered mental status:  Likely from accelerated hypertension with or without component of seizure-not from uremia/disequilibrium.  Mental status back to baseline 2.  End-stage renal disease: Hemodialysis today on his usual outpatient TTS schedule with efforts at challenging his dry weight.  He does not have any acute indications for dialysis today.  Hold heparin for now w/ L eye issues 3.  Malignant hypertension: Unfortunately with a history of poor adherence with antihypertensive therapy-remains on Cardene drip; getting these meds now metoprolol to 50 mg bid, spironolactone 50 mg daily, nifedipine XL 30 mg qd and Bidil bid. Get vol down w/ HD 4.  Anemia of chronic kidney disease: Hemoglobin/hematocrit currently at goal and without overt loss. 5.  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 6. L eye pain: per primary, will hold heparin w/ Hd for this for now   Kelly Splinter MD [facility] pgr [phone]   02/12/2018, 10:41 AM      Subjective:   Poor visoin out of L eye, no other c/o's.      Objective:   BP (!) 168/85   Pulse 77   Temp 98.4 F (36.9 C) (Oral)   Resp 14   Wt 92.2 kg   SpO2 97%   BMI 28.35 kg/m   OP HD:  TTS SW  4h 33min  500/800  88.5kg  2/2.25  LUA AVF  Hep 2000 - M 30 every 2 wks - venofer 50 mg  - hect 4 ug tiw  - sensipar 90 tiw   Physical Exam: Gen: Sleeping comfortably in bed, awakens to calling out his name CVS: Pulse regular rhythm, normal rate, S1 and S2 normal Resp: Clear to auscultation, no rales/rhonchi Abd: Soft, flat, nontender Ext: No lower extremity edema  Labs: BMET Recent Labs  Lab 02/08/18 1139 02/09/18 0353 02/10/18 0634 02/11/18 0116 02/12/18 0411  NA 134* 140 140 132* 140  K 4.3 4.5 4.6 4.9 4.8  CL 88* 97* 100  91* 99  CO2 28 29 23 25 27   GLUCOSE 270* 74 73 331* 49*  BUN 30* 45* 56* 33* 47*  CREATININE 9.46* 11.81* 14.03* 9.49* 12.75*  CALCIUM 9.0 8.4* 8.4* 8.4* 8.8*  PHOS  --  5.0* 5.8* 5.1* 7.2*   CBC Recent Labs  Lab 02/08/18 1139 02/09/18 0353 02/12/18 0411  WBC 4.4 4.1 5.1  NEUTROABS 2.8  --   --   HGB 12.5* 12.1* 11.7*  HCT 41.0 39.4 36.9*  MCV 90.1 88.5 88.3  PLT 278 334 349   Medications:    . Chlorhexidine Gluconate Cloth  6 each Topical Q0600  . ciprofloxacin  1-2 drop Left Eye Q2H while awake  . [START ON 02/14/2018] ciprofloxacin  1-2 drop Left Eye Q6H WA  . heparin  5,000 Units Subcutaneous Q8H  . insulin aspart  0-9 Units Subcutaneous TID WC  . insulin aspart  3 Units Subcutaneous TID WC  . insulin glargine  8 Units Subcutaneous QHS  . isosorbide-hydrALAZINE  1 tablet Oral TID  . metoprolol tartrate  50 mg Oral BID  . NIFEdipine  30 mg Oral BID  . spironolactone  50 mg Oral Daily

## 2018-02-12 NOTE — Progress Notes (Signed)
Inpatient Diabetes Program Recommendations  AACE/ADA: New Consensus Statement on Inpatient Glycemic Control (2015)  Target Ranges:  Prepandial:   less than 140 mg/dL      Peak postprandial:   less than 180 mg/dL (1-2 hours)      Critically ill patients:  140 - 180 mg/dL   Lab Results  Component Value Date   GLUCAP 134 (H) 02/12/2018   HGBA1C 9.7 12/14/2016    Review of Glycemic Control Results for YUSIF, GNAU (MRN [id number]) as of 02/12/2018 09:42  Ref. Range 02/12/2018 04:14 02/12/2018 04:47 02/12/2018 05:15 02/12/2018 08:04  Glucose-Capillary Latest Ref Range: 70 - 99 mg/dL 47 (L) 53 (L) 84 134 (H)   Diabetes history: DM 1 Outpatient Diabetes medications: Lantus 25 units daily Current orders for Inpatient glycemic control: Lantus 12 units QHS, Novolog 0-9 units Q4H  Inpatient Diabetes Program Recommendations:    Noted consult and patient experiencing multiple episodes of hypoglycemia during admission.   Given that patient has a diet order placed consider the following:  - Changing correction to Novolog 0-9 units TID - Adding meal coverage: Novolog 3 units TID (assuming that patient is consuming >50% of meal) -Decreasing Lantus to 8 units QD - Additionally, last A1C was from 2018. Consider repeating A1C?  Thanks, Bronson Curb, MSN, RNC-OB Diabetes Coordinator [phone] (8a-5p)

## 2018-02-13 DIAGNOSIS — R401 Stupor: Secondary | ICD-10-CM

## 2018-02-13 LAB — BASIC METABOLIC PANEL
Anion gap: 17 — ABNORMAL HIGH (ref 5–15)
BUN: 62 mg/dL — ABNORMAL HIGH (ref 6–20)
CO2: 25 mmol/L (ref 22–32)
Calcium: 8.2 mg/dL — ABNORMAL LOW (ref 8.9–10.3)
Chloride: 94 mmol/L — ABNORMAL LOW (ref 98–111)
Creatinine, Ser: 15.21 mg/dL — ABNORMAL HIGH (ref 0.61–1.24)
GFR calc Af Amer: 4 mL/min — ABNORMAL LOW (ref >60)
GFR calc non Af Amer: 4 mL/min — ABNORMAL LOW (ref >60)
Glucose, Bld: 286 mg/dL — ABNORMAL HIGH (ref 70–99)
POTASSIUM: 6.5 mmol/L — AB (ref 3.5–5.1)
Sodium: 136 mmol/L (ref 135–145)

## 2018-02-13 LAB — CBC
HCT: 33.5 % — ABNORMAL LOW (ref 39.0–52.0)
Hemoglobin: 10.3 g/dL — ABNORMAL LOW (ref 13.0–17.0)
MCH: 27.7 pg (ref 26.0–34.0)
MCHC: 30.7 g/dL (ref 30.0–36.0)
MCV: 90.1 fL (ref 80.0–100.0)
PLATELETS: 353 10*3/uL (ref 150–400)
RBC: 3.72 MIL/uL — ABNORMAL LOW (ref 4.22–5.81)
RDW: 13.2 % (ref 11.5–15.5)
WBC: 7.4 10*3/uL (ref 4.0–10.5)
nRBC: 0 % (ref 0.0–0.2)

## 2018-02-13 LAB — GLUCOSE, CAPILLARY
GLUCOSE-CAPILLARY: 331 mg/dL — AB (ref 70–99)
Glucose-Capillary: 136 mg/dL — ABNORMAL HIGH (ref 70–99)
Glucose-Capillary: 174 mg/dL — ABNORMAL HIGH (ref 70–99)
Glucose-Capillary: 279 mg/dL — ABNORMAL HIGH (ref 70–99)
Glucose-Capillary: 299 mg/dL — ABNORMAL HIGH (ref 70–99)
Glucose-Capillary: 390 mg/dL — ABNORMAL HIGH (ref 70–99)
Glucose-Capillary: 476 mg/dL — ABNORMAL HIGH (ref 70–99)

## 2018-02-13 LAB — MAGNESIUM: Magnesium: 2.4 mg/dL (ref 1.7–2.4)

## 2018-02-13 LAB — PHOSPHORUS: Phosphorus: 6.9 mg/dL — ABNORMAL HIGH (ref 2.5–4.6)

## 2018-02-13 LAB — HEMOGLOBIN A1C
Hgb A1c MFr Bld: 9.1 % — ABNORMAL HIGH (ref 4.8–5.6)
Mean Plasma Glucose: 214.47 mg/dL

## 2018-02-13 MED ORDER — INSULIN ASPART 100 UNIT/ML ~~LOC~~ SOLN
0.0000 [IU] | Freq: Three times a day (TID) | SUBCUTANEOUS | Status: DC
  Administered 2018-02-13: 5 [IU] via SUBCUTANEOUS
  Administered 2018-02-14: 4 [IU] via SUBCUTANEOUS
  Administered 2018-02-14: 3 [IU] via SUBCUTANEOUS
  Administered 2018-02-14: 5 [IU] via SUBCUTANEOUS

## 2018-02-13 MED ORDER — INSULIN ASPART 100 UNIT/ML ~~LOC~~ SOLN
2.0000 [IU] | Freq: Three times a day (TID) | SUBCUTANEOUS | Status: DC
  Administered 2018-02-13 – 2018-02-15 (×5): 2 [IU] via SUBCUTANEOUS

## 2018-02-13 MED ORDER — INSULIN ASPART 100 UNIT/ML ~~LOC~~ SOLN
0.0000 [IU] | SUBCUTANEOUS | Status: DC
  Administered 2018-02-13: 5 [IU] via SUBCUTANEOUS
  Administered 2018-02-13: 7 [IU] via SUBCUTANEOUS
  Administered 2018-02-13: 2 [IU] via SUBCUTANEOUS

## 2018-02-13 MED ORDER — HYDRALAZINE HCL 20 MG/ML IJ SOLN
10.0000 mg | INTRAMUSCULAR | Status: DC | PRN
  Administered 2018-02-13 (×2): 20 mg via INTRAVENOUS
  Filled 2018-02-13 (×2): qty 1

## 2018-02-13 MED ORDER — SODIUM CHLORIDE 0.9 % IV BOLUS: 1000.0000 mL | Freq: Once | INTRAVENOUS | Status: DC

## 2018-02-13 MED ORDER — SODIUM POLYSTYRENE SULFONATE 15 GM/60ML PO SUSP
30.0000 g | Freq: Once | ORAL | Status: AC
  Administered 2018-02-13: 30 g via ORAL
  Filled 2018-02-13: qty 120

## 2018-02-13 NOTE — Progress Notes (Signed)
Notified MD Sommers about K+6.5, see orders in [facility]. Also, notified MD Joelyn Oms, per MD dialysis will be done early in the day. Will continue to monitor.

## 2018-02-13 NOTE — Progress Notes (Signed)
Pt receiving HD at bedside. Per HD RN, attempting to take off more fluid than originally planned, requesting this RN hold BP meds despite hypertension.  After one hr SBP over 190, Bidil given. SBP: 170s after 30 min.  Spoke w/ Dr. Jonnie Finner who gave ok to give scheduled aldactone and nifedipine. Instructions to hold lopressor.  Most recent BP:125/50.

## 2018-02-13 NOTE — Progress Notes (Signed)
NAME:  Edward Colon, MRN:  [id number], DOB:  Feb 21, 1981, LOS: 5 ADMISSION DATE:  02/08/2018, CONSULTATION DATE:  12/5 REFERRING MD:  EDP, CHIEF COMPLAINT:  AMS    Brief History   37 year old male with history of ESRD on HD, type 1 diabetes, hypertension, history of seizures secondary to hypoglycemia admitted on 12/5 with AMS and hypertensive emergency. PCCM called for ICU admission.  Mental status improved without intervention.  On Cleviprex drip for hypertension.   Past Medical History   has a past medical history of Anemia, Blind left eye (since ~ 2010), Depression, Diabetic peripheral neuropathy ([place]), ESRD (end stage renal disease) on dialysis ([place]), Heart murmur, Hypertension, Seizures ([place]), and Type 1 diabetes ([place]) (dx'd 1990).  Significant Hospital Events   12/6 Admitted for encephalopathy  Consults:  Neuro 12/5 > 12/6 Nephrology 12/6  Procedures:  12/7 Dialysis  Significant Diagnostic Tests:  EEG 12/5>> No seizure activity present in study CT head 12/5>>> Negative for acute abnormality  Micro Data:  -None  Antimicrobials:   -None  Interim history/subjective:  No complaints. Glucose has been labile. Had a low of 17 yesterday. Of clevidipine. L eye feels better.   Objective   Blood pressure (!) 127/54, pulse 82, temperature 98.5 F (36.9 C), temperature source Oral, resp. rate 15, weight 92.4 kg, SpO2 99 %.        Intake/Output Summary (Last 24 hours) at 02/13/2018 1142 Last data filed at 02/13/2018 0600 Gross per 24 hour  Intake 148.04 ml  Output -  Net 148.04 ml   Filed Weights   02/12/18 0500 02/13/18 0500 02/13/18 0718  Weight: 92.2 kg 93 kg 92.4 kg    Examination: General: young adult male in NAD HENT: [place]/AT. L pupil irregular. L corneal irritation markedly improved.  Lungs: Clear bilateral breath sounds, unlabored Cardiovascular: RRR, no mrg Abdomen: soft, non tender, non-distended  Extremities: warm and dry, no edema,  LUE AVF  neuro:  alert, oriented, non-focal. Pleasant, very easy-going.   Resolved Hospital Problem list   Altered mental status  Assessment & Plan:   Insulin-dependent DM Type 1 c/b peripheral nephropathy, left eye blindness Severe Hypoglycemia - Hypoglycemic overnight. And has been hyperglycemic since. DM coordinator team working on resolution for this. Awaiting their recommendations.   HTN Urgency -Non-adherent with  med regimen. Believed his medications were ineffective. - Cleviprex off 12/9  - Continue aldactone, nifedipine, metoprolol, bidil - Monitor BP with HD today. Worried it may drop low.  - HD per nephrology - Consider resistant hypertension workup.     ESRD secondary to DM  - Renal following  - HD 12/10 - F/u chem   L eye irritation. Describes eye pain and drainage. This is the eye that he is blind in.  - continue OTC redness drops - Improved with Cipro drops. Continue these as ordered for 7 days.   Best practice:  Diet: Diabetic Diet Pain/Anxiety/Delirium protocol (if indicated): n/a VAP protocol (if indicated): n/a DVT prophylaxis: SQ heparin GI prophylaxis: n/a Glucose control: lantus, SSI Mobility: Bedrest Code Status: full Family Communication: patient updated bedside.  Disposition: Transfer to stepdown due to labile BP and glucose. Will call TRH to assume 12/11  Labs   CBC: Recent Labs  Lab 02/08/18 1139 02/09/18 0353 02/12/18 0411 02/13/18 0324  WBC 4.4 4.1 5.1 7.4  NEUTROABS 2.8  --   --   --   HGB 12.5* 12.1* 11.7* 10.3*  HCT 41.0 39.4 36.9* 33.5*  MCV 90.1 88.5 88.3 90.1  PLT 278 334 349 601    Basic Metabolic Panel: Recent Labs  Lab 02/09/18 0353 02/10/18 0634 02/11/18 0116 02/12/18 0411 02/13/18 0324  NA 140 140 132* 140 136  K 4.5 4.6 4.9 4.8 6.5*  CL 97* 100 91* 99 94*  CO2 29 23 25 27 25   GLUCOSE 74 73 331* 49* 286*  BUN 45* 56* 33* 47* 62*  CREATININE 11.81* 14.03* 9.49* 12.75* 15.21*  CALCIUM 8.4* 8.4* 8.4* 8.8* 8.2*  MG  --  2.3 2.2  2.4 2.4  PHOS 5.0* 5.8* 5.1* 7.2* 6.9*   GFR: CrCl cannot be calculated (Unknown ideal weight.). Recent Labs  Lab 02/08/18 1139 02/09/18 0353 02/12/18 0411 02/13/18 0324  WBC 4.4 4.1 5.1 7.4    Liver Function Tests: Recent Labs  Lab 02/08/18 1139 02/09/18 0353  AST 27  --   ALT 34  --   ALKPHOS 94  --   BILITOT 1.2  --   PROT 9.2*  --   ALBUMIN 4.2 3.1*   No results for input(s): LIPASE, AMYLASE in the last 168 hours. Recent Labs  Lab 02/08/18 1818  AMMONIA 45*   HbA1C: Hemoglobin A1C  Date/Time Value Ref Range Status  12/14/2016 10:37 AM 9.7  Final  04/11/2016 10:43 AM 9.7  Final   Hgb A1c MFr Bld  Date/Time Value Ref Range Status  02/13/2018 03:24 AM 9.1 (H) 4.8 - 5.6 % Final    Comment:    (NOTE) Pre diabetes:          5.7%-6.4% Diabetes:              >6.4% Glycemic control for   <7.0% adults with diabetes   08/31/2014 04:42 AM 10.3 (H) 4.8 - 5.6 % Final    Comment:    (NOTE)         Pre-diabetes: 5.7 - 6.4         Diabetes: >6.4         Glycemic control for adults with diabetes: <7.0     CBG: Recent Labs  Lab 02/12/18 1911 02/12/18 1927 02/13/18 0009 02/13/18 0350 02/13/18 0800  GLUCAP 17* 114* 331* 299* 136*    My critical care time excluding procedures: 35 minutes   Georgann Housekeeper, AGACNP-BC [place] Pager [phone] or [phone]  02/13/2018 11:42 AM

## 2018-02-13 NOTE — Progress Notes (Signed)
Inpatient Diabetes Program Recommendations  AACE/ADA: New Consensus Statement on Inpatient Glycemic Control (2015)  Target Ranges:  Prepandial:   less than 140 mg/dL      Peak postprandial:   less than 180 mg/dL (1-2 hours)      Critically ill patients:  140 - 180 mg/dL   Lab Results  Component Value Date   GLUCAP 136 (H) 02/13/2018   HGBA1C 9.1 (H) 02/13/2018    Review of Glycemic Control Results for Edward Colon, Edward Colon (MRN [id number]) as of 02/13/2018 10:57  Ref. Range 02/12/2018 19:11 02/12/2018 19:27 02/13/2018 00:09 02/13/2018 03:50  Glucose-Capillary Latest Ref Range: 70 - 99 mg/dL 17 (LL) 114 (H) 331 (H) 299 (H)    Diabetes history:DM 1 Outpatient Diabetes medications:Lantus 25 units daily Current orders for Inpatient glycemic control:Lantus 8 units QHS, Novolog 0-9 units Q4H  Inpatient Diabetes Program Recommendations:  Noted severe hypoglycemia of 17 mg/dL following meal coverage and correction, however correction was Q4H.   - Consider adding back small amount of meal coverage: Novolog 2 units  - Novolog Custom Correction Scale TID  CBG <70 - implement hypoglycemia protocol   70-120- 0 121-150 - 0 151-200 - 1 201-250 - 2 251-300 - 3 301-350 - 4 351-400 - 5 >400- call MD  Thanks, Bronson Curb, MSN, RNC-OB Diabetes Coordinator [phone] (8a-5p)

## 2018-02-13 NOTE — Progress Notes (Signed)
[place] Progress Note Patient Name: Edward Colon DOB: 06-10-1980 MRN: [id number]   Date of Service  02/13/2018  HPI/Events of Note  Hyperglycemia - Blood glucose = 331.   eICU Interventions  Will change to Q 4 hour sensitive Novolog SSI.      Intervention Category Major Interventions: Hyperglycemia - active titration of insulin therapy  Sommer,Steven Eugene 02/13/2018, 12:29 AM

## 2018-02-13 NOTE — Progress Notes (Signed)
Patient ID: Edward Colon, male   DOB: 02/04/1981, 37 y.o.   MRN: [id number]  [facility] Progress Note   Assessment/ Plan:   1.  Altered mental status:  Likely from accelerated hypertension with or without component of seizure-not from uremia/disequilibrium.  Mental status back to baseline 2.  ESRD on HD: HD today, then continue TTS schedule. Holding heparin for now w/ L eye issues. Vol excess should improved w/ HD today. May need dry wt lowering.  3.  Malignant hypertension: with a history of poor adherence to medications, off Cardene drip now. Continues on po metoprolol 50 mg bid, spironolactone 50 mg daily, nifedipine XL 30 mg qd and Bidil bid. Get vol down w/ HD 4.  Anemia of chronic kidney disease: Hemoglobin/hematocrit currently at goal and without overt loss. 5.  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 6. L eye pain: hold heparin w/ Hd for this for now   Kelly Splinter MD [facility] pgr [phone]   02/13/2018, 3:43 PM      Subjective:   No new c/o    Objective:   BP (!) 133/47   Pulse 77   Temp 98.6 F (37 C) (Oral)   Resp 14   Wt 92.4 kg   SpO2 99%   BMI 28.41 kg/m   OP HD:  TTS SW  4h 15min  500/800  88.5kg  2/2.25  LUA AVF  Hep 2000 - M 30 every 2 wks - venofer 50 mg  - hect 4 ug tiw  - sensipar 90 tiw   Physical Exam: Gen: on the phone talking w/ friends, no distress CVS: Pulse regular rhythm, normal rate, S1 and S2 normal Resp: Clear to auscultation, no rales/rhonchi Abd: Soft, flat, nontender Ext: No lower extremity edema  Labs: BMET Recent Labs  Lab 02/08/18 1139 02/09/18 0353 02/10/18 0634 02/11/18 0116 02/12/18 0411 02/13/18 0324  NA 134* 140 140 132* 140 136  K 4.3 4.5 4.6 4.9 4.8 6.5*  CL 88* 97* 100 91* 99 94*  CO2 28 29 23 25 27 25   GLUCOSE 270* 74 73 331* 49* 286*  BUN 30* 45* 56* 33* 47* 62*  CREATININE 9.46* 11.81* 14.03* 9.49* 12.75* 15.21*   CALCIUM 9.0 8.4* 8.4* 8.4* 8.8* 8.2*  PHOS  --  5.0* 5.8* 5.1* 7.2* 6.9*   CBC Recent Labs  Lab 02/08/18 1139 02/09/18 0353 02/12/18 0411 02/13/18 0324  WBC 4.4 4.1 5.1 7.4  NEUTROABS 2.8  --   --   --   HGB 12.5* 12.1* 11.7* 10.3*  HCT 41.0 39.4 36.9* 33.5*  MCV 90.1 88.5 88.3 90.1  PLT 278 334 349 353   Medications:    . Chlorhexidine Gluconate Cloth  6 each Topical Q0600  . Chlorhexidine Gluconate Cloth  6 each Topical Q0600  . ciprofloxacin  1-2 drop Left Eye Q2H while awake  . [START ON 02/14/2018] ciprofloxacin  1-2 drop Left Eye Q6H WA  . heparin  5,000 Units Subcutaneous Q8H  . insulin aspart  0-5 Units Subcutaneous TID WC  . insulin aspart  2 Units Subcutaneous TID WC  . insulin glargine  8 Units Subcutaneous QHS  . isosorbide-hydrALAZINE  1 tablet Oral TID  . metoprolol tartrate  50 mg Oral BID  . NIFEdipine  30 mg Oral BID  . spironolactone  50 mg Oral Daily

## 2018-02-13 NOTE — Progress Notes (Signed)
[place] Progress Note Patient Name: Edward Colon DOB: [birth year] MRN: [id number]   Date of Service  02/13/2018  HPI/Events of Note  Hyperkalemia - K+ = 6.5.  eICU Interventions  Will order: 1. Kayexalate 30 gm PO now.  2. Notify renal service so that HD can be arranged for early in the day.      Intervention Category Major Interventions: Electrolyte abnormality - evaluation and management  Sommer,Steven Eugene 02/13/2018, 4:22 AM

## 2018-02-13 NOTE — Progress Notes (Signed)
Pts Bidil due at 1600, is now 1830 and still have not received from pharmacy. Have sent message and called 3x now.  Pts SBP now over 190. Spoke w/ pharmacist who gave permission to give hydralazine 1 hour early.

## 2018-02-14 DIAGNOSIS — E1022 Type 1 diabetes mellitus with diabetic chronic kidney disease: Secondary | ICD-10-CM

## 2018-02-14 DIAGNOSIS — D631 Anemia in chronic kidney disease: Secondary | ICD-10-CM

## 2018-02-14 LAB — BASIC METABOLIC PANEL
Anion gap: 14 (ref 5–15)
BUN: 43 mg/dL — ABNORMAL HIGH (ref 6–20)
CO2: 26 mmol/L (ref 22–32)
Calcium: 8.1 mg/dL — ABNORMAL LOW (ref 8.9–10.3)
Chloride: 94 mmol/L — ABNORMAL LOW (ref 98–111)
Creatinine, Ser: 10.52 mg/dL — ABNORMAL HIGH (ref 0.61–1.24)
GFR calc Af Amer: 7 mL/min — ABNORMAL LOW (ref >60)
GFR calc non Af Amer: 6 mL/min — ABNORMAL LOW (ref >60)
Glucose, Bld: 352 mg/dL — ABNORMAL HIGH (ref 70–99)
Potassium: 5.4 mmol/L — ABNORMAL HIGH (ref 3.5–5.1)
SODIUM: 134 mmol/L — AB (ref 135–145)

## 2018-02-14 LAB — GLUCOSE, CAPILLARY
GLUCOSE-CAPILLARY: 335 mg/dL — AB (ref 70–99)
Glucose-Capillary: 377 mg/dL — ABNORMAL HIGH (ref 70–99)
Glucose-Capillary: 269 mg/dL — ABNORMAL HIGH (ref 70–99)
Glucose-Capillary: 259 mg/dL — ABNORMAL HIGH (ref 70–99)
Glucose-Capillary: 347 mg/dL — ABNORMAL HIGH (ref 70–99)
Glucose-Capillary: 410 mg/dL — ABNORMAL HIGH (ref 70–99)
Glucose-Capillary: 223 mg/dL — ABNORMAL HIGH (ref 70–99)

## 2018-02-14 LAB — MAGNESIUM: Magnesium: 2 mg/dL (ref 1.7–2.4)

## 2018-02-14 LAB — PHOSPHORUS: Phosphorus: 6.9 mg/dL — ABNORMAL HIGH (ref 2.5–4.6)

## 2018-02-14 MED ORDER — INSULIN GLARGINE 100 UNIT/ML ~~LOC~~ SOLN: 13.0000 [IU] | Freq: Every day | SUBCUTANEOUS | Status: DC

## 2018-02-14 MED ORDER — CHLORHEXIDINE GLUCONATE CLOTH 2 % EX PADS: 6.0000 | MEDICATED_PAD | Freq: Every day | CUTANEOUS | Status: DC

## 2018-02-14 MED ORDER — INSULIN GLARGINE 100 UNIT/ML ~~LOC~~ SOLN
10.0000 [IU] | Freq: Every day | SUBCUTANEOUS | Status: DC
  Administered 2018-02-14: 10 [IU] via SUBCUTANEOUS
  Filled 2018-02-14 (×2): qty 0.1

## 2018-02-14 NOTE — Progress Notes (Addendum)
PROGRESS NOTE  Edward Colon OVZ:[id number] DOB: 01-Feb-1981 DOA: 02/08/2018 PCP: Nolene Ebbs, MD  HPI/Brief Narrative  Edward Colon is a 37 y.o. year old male with medical history significant for T1DM, ESRD on HD, hypertension, anemia who presented on 02/08/2018 with altered mental status and was found to have hypertensive emergency in setting of nonadherence to blood pressure regimen and dialysis.  Hospital course complicated by Clevidipine drip needed for blood pressure control now tolerating oral regimen, with intermittent HD needed per TTS schedule  Subjective Feels great Wary of starting metoprolol as he feels it contributed to erectile dysfunction as outpatient   Assessment/Plan:  # HTN, improving. SBP in 150s now. Off clevidipine drip for 24 hours today. Long discussion regarding adherence as this is the most obvious culprit to his presentation.   He does not have a PCP and does not want one electing to get checks during dialysis.  Continue to monitor on metoprolol, spironolactone, nifedipine and BiDil.  Suspect may need to go up 1 1 of these as patient would likely discontinue metoprolol himself given reported side effects of erectile dysfunction  #ESRD on HD, TTS.  On outpatient schedule.  Appreciate nephrology recommendations.  From renal standpoint approaching potential discharge next 24 to 48 hours.  #Type 1 diabetes, poorly controlled.  A1c 9.1%.  Fasting blood sugar 269, some room to go up on Lantus, takes 25units at home, will slightly increase from 8 units here to 10 units and monitor closely as patient has had severe hypoglycemia episodes while inpatient.  #Left eye pain.  Continue clear eyes and ciprofloxacin drops  #Acute hypertensive encephalopathy.  In setting of hypertensive emergency, resolved after BP control.  EEG was negative for seizure activity.  Code Status: FULL CODE   Family Communication: No family at bedside  Disposition Plan: Dissipate  discharge in potentially 24 hours if blood pressure remains readily controlled with adequate oral regimen plan to euvolemic from renal standpoint.   Consultants:   Treatment Team:   Elmarie Shiley, MD    Antimicrobials: Anti-infectives (From admission, onward)   None         Objective: Vitals:   02/14/18 1200 02/14/18 1230 02/14/18 1254 02/14/18 1631  BP: (!) 154/58 95/83 (!) 182/94 (!) 152/84  Pulse: 79 80 79 82  Resp: 20 19 18 18   Temp:   98.4 F (36.9 C) 98.1 F (36.7 C)  TempSrc:   Oral Oral  SpO2: 97% 98% 100% 100%  Weight:        Intake/Output Summary (Last 24 hours) at 02/14/2018 2017 Last data filed at 02/14/2018 1900 Gross per 24 hour  Intake 360 ml  Output -  Net 360 ml   Filed Weights   02/13/18 0718 02/13/18 1152 02/14/18 0500  Weight: 92.4 kg 87.9 kg 89.7 kg    Exam:  Constitutional: Middle-age male, no distress Eyes: Slight redness in left eye ENMT: Oropharynx with moist mucous membranes, normal dentition Cardiovascular: RRR no MRGs, with no peripheral edema Respiratory: Normal respiratory effort, clear breath sounds  Abdomen: Soft,non-tender,  Skin: No rash ulcers, or lesions.  Left arm fistula with palpable thrill and audible bruit Neurologic: Grossly no focal neuro deficit. Psychiatric:Appropriate affect, and mood. Mental status AAOx3  Data Reviewed: CBC: Recent Labs  Lab 02/08/18 1139 02/09/18 0353 02/12/18 0411 02/13/18 0324  WBC 4.4 4.1 5.1 7.4  NEUTROABS 2.8  --   --   --   HGB 12.5* 12.1* 11.7* 10.3*  HCT 41.0 39.4 36.9* 33.5*  MCV 90.1 88.5 88.3 90.1  PLT 278 334 349 409   Basic Metabolic Panel: Recent Labs  Lab 02/10/18 0634 02/11/18 0116 02/12/18 0411 02/13/18 0324 02/14/18 0458  NA 140 132* 140 136 134*  K 4.6 4.9 4.8 6.5* 5.4*  CL 100 91* 99 94* 94*  CO2 23 25 27 25 26   GLUCOSE 73 331* 49* 286* 352*  BUN 56* 33* 47* 62* 43*  CREATININE 14.03* 9.49* 12.75* 15.21* 10.52*  CALCIUM 8.4* 8.4* 8.8* 8.2* 8.1*  MG  2.3 2.2 2.4 2.4 2.0  PHOS 5.8* 5.1* 7.2* 6.9* 6.9*   GFR: CrCl cannot be calculated (Unknown ideal weight.). Liver Function Tests: Recent Labs  Lab 02/08/18 1139 02/09/18 0353  AST 27  --   ALT 34  --   ALKPHOS 94  --   BILITOT 1.2  --   PROT 9.2*  --   ALBUMIN 4.2 3.1*   No results for input(s): LIPASE, AMYLASE in the last 168 hours. Recent Labs  Lab 02/08/18 1818  AMMONIA 45*   Coagulation Profile: No results for input(s): INR, PROTIME in the last 168 hours. Cardiac Enzymes: No results for input(s): CKTOTAL, CKMB, CKMBINDEX, TROPONINI in the last 168 hours. BNP (last 3 results) No results for input(s): PROBNP in the last 8760 hours. HbA1C: Recent Labs    02/13/18 0324  HGBA1C 9.1*   CBG: Recent Labs  Lab 02/14/18 0752 02/14/18 0902 02/14/18 1131 02/14/18 1253 02/14/18 1632  GLUCAP 269* 259* 335* 347* 410*   Lipid Profile: No results for input(s): CHOL, HDL, LDLCALC, TRIG, CHOLHDL, LDLDIRECT in the last 72 hours. Thyroid Function Tests: No results for input(s): TSH, T4TOTAL, FREET4, T3FREE, THYROIDAB in the last 72 hours. Anemia Panel: No results for input(s): VITAMINB12, FOLATE, FERRITIN, TIBC, IRON, RETICCTPCT in the last 72 hours. Urine analysis:    Component Value Date/Time   COLORURINE YELLOW 03/05/2017 0806   APPEARANCEUR HAZY (A) 03/05/2017 0806   LABSPEC 1.014 03/05/2017 0806   PHURINE 5.0 03/05/2017 0806   GLUCOSEU >=500 (A) 03/05/2017 0806   HGBUR MODERATE (A) 03/05/2017 0806   BILIRUBINUR NEGATIVE 03/05/2017 0806   KETONESUR 20 (A) 03/05/2017 0806   PROTEINUR >=300 (A) 03/05/2017 0806   UROBILINOGEN 0.2 10/26/2013 2324   NITRITE NEGATIVE 03/05/2017 0806   LEUKOCYTESUR NEGATIVE 03/05/2017 0806   Sepsis Labs: @LABRCNTIP (procalcitonin:4,lacticidven:4)  ) Recent Results (from the past 240 hour(s))  MRSA PCR Screening     Status: None   Collection Time: 02/08/18  4:44 PM  Result Value Ref Range Status   MRSA by PCR NEGATIVE NEGATIVE  Final    Comment:        The GeneXpert MRSA Assay (FDA approved for NASAL specimens only), is one component of a comprehensive MRSA colonization surveillance program. It is not intended to diagnose MRSA infection nor to guide or monitor treatment for MRSA infections. Performed at [facility], [place] [address], [place], [place] [address]       Studies: No results found.  Scheduled Meds: . ciprofloxacin  1-2 drop Left Eye Q6H WA  . heparin  5,000 Units Subcutaneous Q8H  . insulin aspart  0-5 Units Subcutaneous TID WC  . insulin aspart  2 Units Subcutaneous TID WC  . insulin glargine  8 Units Subcutaneous QHS  . isosorbide-hydrALAZINE  1 tablet Oral TID  . metoprolol tartrate  50 mg Oral BID  . NIFEdipine  30 mg Oral BID  . spironolactone  50 mg Oral Daily    Continuous Infusions:  LOS: 6 days     Desiree Hane, MD [facility] Pager [phone]  If 7PM-7AM, please contact night-coverage [email or web address] Password [facility] 02/14/2018, 8:17 PM

## 2018-02-14 NOTE — Progress Notes (Signed)
Inpatient Diabetes Program Recommendations  AACE/ADA: New Consensus Statement on Inpatient Glycemic Control (2015)  Target Ranges:  Prepandial:   less than 140 mg/dL      Peak postprandial:   less than 180 mg/dL (1-2 hours)      Critically ill patients:  140 - 180 mg/dL   Lab Results  Component Value Date   GLUCAP 335 (H) 02/14/2018   HGBA1C 9.1 (H) 02/13/2018    Review of Glycemic Control Results for Edward Colon, Edward Colon (MRN [id number]) as of 02/14/2018 13:09  Ref. Range 02/14/2018 07:52 02/14/2018 09:02 02/14/2018 11:31  Glucose-Capillary Latest Ref Range: 70 - 99 mg/dL 269 (H) 259 (H) 335 (H)   Diabetes history:DM 1 Outpatient Diabetes medications:Lantus 25units daily Current orders for Inpatient glycemic control:Lantus 8 units QHS, Novolog 0-9 units Q4H  Inpatient Diabetes Program Recommendations:    Recommending slight increase to basal insulin. Consider Lantus 10 units QHS.  Thanks, Bronson Curb, MSN, RNC-OB Diabetes Coordinator [phone] (8a-5p)

## 2018-02-14 NOTE — Progress Notes (Signed)
Patient ID: Edward Colon, male   DOB: October 10, 1980, 37 y.o.   MRN: [id number]  [facility] Progress Note   Assessment/ Plan:   1.  Altered mental status/ malignant HTN:  Likely from accelerated hypertension with or without component of seizure.  SP Cardene drip cont metoprolol/ spironolactone/ nifedipine/ Bidil.  2.  ESRD on HD TTS: HD tomorrow if still here.  Holding heparin for now w/ L eye issues.  3.  Volume: +1kg today after HD yest. Try to lower edw tomorrow w/ HD.  4.  Anemia ckd - Hb 10's no esa needed for now 5.  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 6. L eye pain: hold heparin w/ Hd for this for now 7. Dispo: much better, close to dry wt and BP's reasonably controlled. From renal standpoint approaching oK for dc 1-2 days.    Kelly Splinter MD [employer] pgr [phone]   02/14/2018, 11:01 AM   Subjective:   No new c/o    Objective:   BP (!) 176/76   Pulse 86   Temp (!) 96.9 F (36.1 C) (Oral)   Resp 12   Wt 89.7 kg   SpO2 96%   BMI 27.58 kg/m   OP HD:  TTS SW  4h 64min  500/800  88.5kg  2/2.25  LUA AVF  Hep 2000 - M 30 every 2 wks - venofer 50 mg  - hect 4 ug tiw  - sensipar 90 tiw   Physical Exam: Gen: on the phone talking w/ friends, no distress CVS: Pulse regular rhythm, normal rate, S1 and S2 normal Resp: Clear to auscultation, no rales/rhonchi Abd: Soft, flat, nontender Ext: No lower extremity edema  Labs: BMET Recent Labs  Lab 02/08/18 1139 02/09/18 0353 02/10/18 0634 02/11/18 0116 02/12/18 0411 02/13/18 0324 02/14/18 0458  NA 134* 140 140 132* 140 136 134*  K 4.3 4.5 4.6 4.9 4.8 6.5* 5.4*  CL 88* 97* 100 91* 99 94* 94*  CO2 28 29 23 25 27 25 26   GLUCOSE 270* 74 73 331* 49* 286* 352*  BUN 30* 45* 56* 33* 47* 62* 43*  CREATININE 9.46* 11.81* 14.03* 9.49* 12.75* 15.21* 10.52*  CALCIUM 9.0 8.4* 8.4* 8.4* 8.8* 8.2* 8.1*  PHOS  --  5.0* 5.8* 5.1* 7.2*  6.9* 6.9*   CBC Recent Labs  Lab 02/08/18 1139 02/09/18 0353 02/12/18 0411 02/13/18 0324  WBC 4.4 4.1 5.1 7.4  NEUTROABS 2.8  --   --   --   HGB 12.5* 12.1* 11.7* 10.3*  HCT 41.0 39.4 36.9* 33.5*  MCV 90.1 88.5 88.3 90.1  PLT 278 334 349 353   Medications:    . Chlorhexidine Gluconate Cloth  6 each Topical Q0600  . Chlorhexidine Gluconate Cloth  6 each Topical Q0600  . ciprofloxacin  1-2 drop Left Eye Q6H WA  . heparin  5,000 Units Subcutaneous Q8H  . insulin aspart  0-5 Units Subcutaneous TID WC  . insulin aspart  2 Units Subcutaneous TID WC  . insulin glargine  8 Units Subcutaneous QHS  . isosorbide-hydrALAZINE  1 tablet Oral TID  . metoprolol tartrate  50 mg Oral BID  . NIFEdipine  30 mg Oral BID  . spironolactone  50 mg Oral Daily

## 2018-02-15 ENCOUNTER — Other Ambulatory Visit: Payer: Self-pay

## 2018-02-15 DIAGNOSIS — H5789 Other specified disorders of eye and adnexa: Secondary | ICD-10-CM | POA: Diagnosis present

## 2018-02-15 DIAGNOSIS — G9341 Metabolic encephalopathy: Secondary | ICD-10-CM | POA: Diagnosis present

## 2018-02-15 DIAGNOSIS — I161 Hypertensive emergency: Secondary | ICD-10-CM | POA: Diagnosis present

## 2018-02-15 DIAGNOSIS — I674 Hypertensive encephalopathy: Secondary | ICD-10-CM

## 2018-02-15 LAB — CBC
HEMATOCRIT: 33.8 % — AB (ref 39.0–52.0)
Hemoglobin: 10.5 g/dL — ABNORMAL LOW (ref 13.0–17.0)
MCH: 27.8 pg (ref 26.0–34.0)
MCHC: 31.1 g/dL (ref 30.0–36.0)
MCV: 89.4 fL (ref 80.0–100.0)
Platelets: 309 10*3/uL (ref 150–400)
RBC: 3.78 MIL/uL — ABNORMAL LOW (ref 4.22–5.81)
RDW: 13.3 % (ref 11.5–15.5)
WBC: 5 10*3/uL (ref 4.0–10.5)
nRBC: 0 % (ref 0.0–0.2)

## 2018-02-15 LAB — BASIC METABOLIC PANEL
Anion gap: 18 — ABNORMAL HIGH (ref 5–15)
BUN: 60 mg/dL — ABNORMAL HIGH (ref 6–20)
CO2: 23 mmol/L (ref 22–32)
Calcium: 8.6 mg/dL — ABNORMAL LOW (ref 8.9–10.3)
Chloride: 94 mmol/L — ABNORMAL LOW (ref 98–111)
Creatinine, Ser: 13.02 mg/dL — ABNORMAL HIGH (ref 0.61–1.24)
GFR calc Af Amer: 5 mL/min — ABNORMAL LOW (ref >60)
GFR calc non Af Amer: 4 mL/min — ABNORMAL LOW (ref >60)
Glucose, Bld: 144 mg/dL — ABNORMAL HIGH (ref 70–99)
Potassium: 5.4 mmol/L — ABNORMAL HIGH (ref 3.5–5.1)
SODIUM: 135 mmol/L (ref 135–145)

## 2018-02-15 LAB — GLUCOSE, CAPILLARY
Glucose-Capillary: 156 mg/dL — ABNORMAL HIGH (ref 70–99)
Glucose-Capillary: 127 mg/dL — ABNORMAL HIGH (ref 70–99)

## 2018-02-15 MED ORDER — NIFEDIPINE ER 30 MG PO TB24: 30.0000 mg | ORAL_TABLET | Freq: Two times a day (BID) | ORAL | 0 refills | Status: DC

## 2018-02-15 MED ORDER — SPIRONOLACTONE 50 MG PO TABS: 50.0000 mg | ORAL_TABLET | Freq: Every day | ORAL | 0 refills | Status: DC

## 2018-02-15 MED ORDER — ISOSORB DINITRATE-HYDRALAZINE 20-37.5 MG PO TABS: 1.0000 | ORAL_TABLET | Freq: Three times a day (TID) | ORAL | 0 refills | Status: DC

## 2018-02-15 MED ORDER — LOSARTAN POTASSIUM 50 MG PO TABS
50.0000 mg | ORAL_TABLET | Freq: Every day | ORAL | Status: DC
  Administered 2018-02-15: 50 mg via ORAL
  Filled 2018-02-15: qty 1

## 2018-02-15 MED ORDER — CIPROFLOXACIN HCL 0.3 % OP SOLN: 1.0000 [drp] | Freq: Four times a day (QID) | OPHTHALMIC | 0 refills | Status: AC

## 2018-02-15 MED ORDER — INSULIN GLARGINE 100 UNIT/ML ~~LOC~~ SOLN: 10.0000 [IU] | Freq: Every day | SUBCUTANEOUS | 11 refills | Status: DC

## 2018-02-15 MED ORDER — LOSARTAN POTASSIUM 50 MG PO TABS: 50.0000 mg | ORAL_TABLET | Freq: Every day | ORAL | 0 refills | Status: DC

## 2018-02-15 NOTE — Care Management Important Message (Signed)
Important Message  Patient Details  Name: Edward Colon MRN: [id number] Date of Birth: 1980/04/25   Medicare Important Message Given:  Yes    Orbie Pyo 02/15/2018, 4:02 PM

## 2018-02-15 NOTE — Discharge Summary (Addendum)
Discharge Summary  Edward Colon RFF:[id number] DOB: [birth year]  PCP: Nolene Ebbs, MD  Admit date: 02/08/2018 Discharge date: 02/15/2018   Time spent: > 35 minutes  Admitted From: Home Disposition: Home  Recommendations for Outpatient Follow-up:  1. Follow up with PCP in 1 week, contact formation for community health wellness clinic provided on discharge 2. Discontinue metoprolol 3. Will start nifedipine, losartan, BiDil, spironolactone, given significant instructions on importance of adherence to these medications 4. Reduce Lantus to 10 units, uptitrate with PCP. 5. Continue Cipro eyedrops for additional 4 days    Discharge Diagnoses:  Active Hospital Problems   Diagnosis Date Noted  . Altered mental status 02/08/2018  . ESRD (end stage renal disease) ([place]) 03/07/2015  . Anemia 08/30/2014  . Type 1 diabetes ([place]) 08/30/2014  . Hypertension     Resolved Hospital Problems  No resolved problems to display.    Discharge Condition: Stable  CODE STATUS: Full code   History of present illness:  Edward Colon is a 37 y.o. year old male with medical history significant for T1DM, ESRD on HD TTS, hypertension, anemia, history of seizures  who presented on 02/08/2018 with with altered mental status and was found to have hypertensive emergency in setting of nonadherence to blood pressure regimen and dialysisand was found to have hypertensive emergency. Remaining hospital course addressed in problem based format below:   Hospital Course:   Acute encephalopathy, suspect most likely hypertensive encephalopathy. Hypertensive emergency In the ER he was obtunded with no gag reflex, no response to Narcan, with SBP greater than 240.  He was initially admitted to ICU where he was started on clevidipine drip.  He was able to discontinue on 12/10 and transition to oral regimen including his home metoprolol, nifedipine 30 mg twice daily, BiDil 1 tab twice daily, spironolactone 50  mg daily.  After discussion with patient he is very hesitant about starting medications.  He had previously stopped taking his metoprolol citing side effects of erectile dysfunction.  Given that he would not go out on metoprolol and instead will be replaced with losartan.  Provided extensive counseling to patient given important need to continue to adhere to blood pressure regimen to avoid recurrence.  ESRD on HD TTS.  Prior to admission his last dialysis Wednesday was a day prior.  Of note he had been missing several dialysis sessions in the days prior to admission.  Do not believe his change in mentation was entirely from uremic encephalopathy from HD given relatively good adherence.  He resumed his dialysis sessions during hospitalization with no complications.  Anemia of chronic kidney disease.  Hemoglobin remained stable at baseline.  Type 1 diabetes, poorly controlled.  A1c 9.1%.  Patient takes Lantus 25 units at home.  His regimen was decreased given some episodes of hypoglycemia particularly in setting of diminished p.o. intake.  Advised patient to start Lantus 10 units and slowly uptitrate, reiterated importance in following up with primary doctor  Left eye redness and pain.  Presume related to superficial infection.  Improved with ciprofloxacin eyedrops which she will continue for additional 5 days.  Discontinued Clear Eyes drops  Consultations:  Nephrology,  Procedures/Studies: None  Discharge Exam: BP (!) 178/89 (BP Location: Right Leg)   Pulse 76   Temp 98.2 F (36.8 C) (Oral)   Resp 20   Ht 5\' 11"  (1.803 m)   Wt 90.4 kg   SpO2 100%   BMI 27.80 kg/m   General: Lying in bed, no apparent distress  Eyes: EOMI, minimal redness in left eye otherwise normal ENT: Oral Mucosa clear and moist Cardiovascular: regular rate and rhythm, no murmurs, rubs or gallops, no edema, Respiratory: Normal respiratory effort, lungs clear to auscultation bilaterally Abdomen: soft, non-distended,  non-tender, normal bowel sounds Skin: Left AV fistula in place, audible bruit, palpable thrill Neurologic: Grossly no focal neuro deficit.Mental status AAOx3, speech normal, Psychiatric:Appropriate affect, and mood   Discharge Instructions You were cared for by a hospitalist during your hospital stay. If you have any questions about your discharge medications or the care you received while you were in the hospital after you are discharged, you can call the unit and asked to speak with the hospitalist on call if the hospitalist that took care of you is not available. Once you are discharged, your primary care physician will handle any further medical issues. Please note that NO REFILLS for any discharge medications will be authorized once you are discharged, as it is imperative that you return to your primary care physician (or establish a relationship with a primary care physician if you do not have one) for your aftercare needs so that they can reassess your need for medications and monitor your lab values.  Discharge Instructions    Diet - low sodium heart healthy   Complete by:  As directed    Increase activity slowly   Complete by:  As directed      Allergies as of 02/15/2018   No Known Allergies     Medication List    STOP taking these medications   glucose blood test strip Commonly known as:  ONETOUCH VERIO   HYDROcodone-acetaminophen 5-325 MG tablet Commonly known as:  NORCO/VICODIN   Insulin Glargine 100 UNIT/ML Solostar Pen Commonly known as:  LANTUS SOLOSTAR Replaced by:  insulin glargine 100 UNIT/ML injection   metoprolol tartrate 25 MG tablet Commonly known as:  LOPRESSOR     TAKE these medications   calcium acetate 667 MG capsule Commonly known as:  PHOSLO Take 1 capsule (667 mg total) by mouth 3 (three) times daily with meals. What changed:    how much to take  when to take this  additional instructions   ciprofloxacin 0.3 % ophthalmic solution Commonly  known as:  CILOXAN Place 1-2 drops into the left eye every 6 (six) hours for 4 days. Administer 1 drop, every 2 hours, while awake, for 2 days. Then 1 drop, every 4 hours, while awake, for the next 5 days.   insulin glargine 100 UNIT/ML injection Commonly known as:  LANTUS Inject 0.1 mLs (10 Units total) into the skin at bedtime. Replaces:  Insulin Glargine 100 UNIT/ML Solostar Pen   isosorbide-hydrALAZINE 20-37.5 MG tablet Commonly known as:  BIDIL Take 1 tablet by mouth 3 (three) times daily.   losartan 50 MG tablet Commonly known as:  COZAAR Take 1 tablet (50 mg total) by mouth daily.   multivitamin Tabs tablet Take 1 tablet by mouth at bedtime. What changed:  when to take this   NIFEdipine 30 MG 24 hr tablet Commonly known as:  ADALAT CC Take 1 tablet (30 mg total) by mouth 2 (two) times daily.   spironolactone 50 MG tablet Commonly known as:  ALDACTONE Take 1 tablet (50 mg total) by mouth daily. Start taking on:  February 16, 2018      No Known Allergies    The results of significant diagnostics from this hospitalization (including imaging, microbiology, ancillary and laboratory) are listed below for reference.  Significant Diagnostic Studies: Ct Head Wo Contrast  Result Date: 02/08/2018 CLINICAL DATA:  Altered level of consciousness EXAM: CT HEAD WITHOUT CONTRAST TECHNIQUE: Contiguous axial images were obtained from the base of the skull through the vertex without intravenous contrast. COMPARISON:  01/28/2016 FINDINGS: BRAIN: The ventricles and sulci are normal. No intraparenchymal hemorrhage, mass effect nor midline shift. No acute large vascular territory infarcts. Grey-white matter distinction is maintained. The basal ganglia are unremarkable. No abnormal extra-axial fluid collections. Basal cisterns are not effaced and midline. The brainstem and cerebellar hemispheres are without acute abnormalities. VASCULAR: Unremarkable. SKULL/SOFT TISSUES: No skull fracture. No  significant soft tissue swelling. ORBITS/SINUSES: Redemonstration of curvilinear coarse calcification along the posterior aspect of the left globe with what may represent chronic choroid attachment accounting for elliptical densities along the posterior aspect of the left possibly from effusions. Differential possibilities might include a chronic retinal detachment. Mastoid air cells are clear. The included paranasal sinuses are well-aerated. OTHER: None. IMPRESSION: 1. No acute intracranial abnormality. 2. 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 Electronically Signed   By: Ashley Royalty M.D.   On: 02/08/2018 14:03   Dg Chest Portable 1 View  Result Date: 02/08/2018 CLINICAL DATA:  Generalized cramping following hemodialysis therapy EXAM: PORTABLE CHEST 1 VIEW COMPARISON:  08/25/17 FINDINGS: Cardiac shadow is mildly enlarged but stable. The lungs are well aerated bilaterally. No focal infiltrate or sizable effusion is seen. No bony abnormality is noted. IMPRESSION: No active disease. Electronically Signed   By: Inez Catalina M.D.   On: 02/08/2018 13:44    Microbiology: Recent Results (from the past 240 hour(s))  MRSA PCR Screening     Status: None   Collection Time: 02/08/18  4:44 PM  Result Value Ref Range Status   MRSA by PCR NEGATIVE NEGATIVE Final    Comment:        The GeneXpert MRSA Assay (FDA approved for NASAL specimens only), is one component of a comprehensive MRSA colonization surveillance program. It is not intended to diagnose MRSA infection nor to guide or monitor treatment for MRSA infections. Performed at [facility], [place] [address], [place], [place] [address]      Labs: Basic Metabolic Panel: Recent Labs  Lab 02/10/18 [id number] 02/11/18 0116 02/12/18 0411 02/13/18 0324  02/14/18 0458 02/15/18 0710  NA 140 132* 140 136 134* 135  K 4.6 4.9 4.8 6.5* 5.4* 5.4*  CL 100 91* 99 94* 94* 94*  CO2 23 25 27 25 26 23   GLUCOSE 73 331* 49* 286* 352* 144*  BUN 56* 33* 47* 62* 43* 60*  CREATININE 14.03* 9.49* 12.75* 15.21* 10.52* 13.02*  CALCIUM 8.4* 8.4* 8.8* 8.2* 8.1* 8.6*  MG 2.3 2.2 2.4 2.4 2.0  --   PHOS 5.8* 5.1* 7.2* 6.9* 6.9*  --    Liver Function Tests: Recent Labs  Lab 02/09/18 0353  ALBUMIN 3.1*   No results for input(s): LIPASE, AMYLASE in the last 168 hours. Recent Labs  Lab 02/08/18 1818  AMMONIA 45*   CBC: Recent Labs  Lab 02/09/18 0353 02/12/18 0411 02/13/18 0324 02/15/18 0710  WBC 4.1 5.1 7.4 5.0  HGB 12.1* 11.7* 10.3* 10.5*  HCT 39.4 36.9* 33.5* 33.8*  MCV 88.5 88.3 90.1 89.4  PLT 334 349 353 309   Cardiac Enzymes: No results for input(s): CKTOTAL, CKMB, CKMBINDEX, TROPONINI in the last  168 hours. BNP: BNP (last 3 results) No results for input(s): BNP in the last 8760 hours.  ProBNP (last 3 results) No results for input(s): PROBNP in the last 8760 hours.  CBG: Recent Labs  Lab 02/14/18 1253 02/14/18 1632 02/14/18 2207 02/15/18 0205 02/15/18 1224  GLUCAP 347* 410* 223* 156* 127*       Signed:  Desiree Hane, MD [facility] 02/15/2018, 2:00 PM

## 2018-02-15 NOTE — Care Management Note (Signed)
Case Management Note  Patient Details  Name: Edward Colon MRN: [id number] Date of Birth: 10/13/1980  Subjective/Objective:  Pt presented for hypertension, ESRD- TTS. PTA Independent from home. Plan to transition home today.                  Action/Plan: No further home needs identified at this time.   Expected Discharge Date:  02/15/18               Expected Discharge Plan:  Home/Self Care  In-House Referral:  NA  Discharge planning Services  CM Consult  Post Acute Care Choice:  NA Choice offered to:  NA  DME Arranged:  N/A DME Agency:  NA  HH Arranged:  NA HH Agency:  NA  Status of Service:  Completed, signed off  If discussed at [place] of Stay Meetings, dates discussed:    Additional Comments:  Bethena Roys, RN 02/15/2018, 3:03 PM

## 2018-02-15 NOTE — Progress Notes (Signed)
Patient ID: Edward Colon, male   DOB: Mar 28, 1980, 37 y.o.   MRN: [id number]  [facility] Progress Note   Assessment/ Plan:   1.  Altered mental status/ malignant HTN:  Likely from accelerated hypertension with or without component of seizure.  SP Cardene drip cont metoprolol/ spironolactone/ nifedipine/ Bidil.  Per pmd patient not likely to take metoprolol, will add losartan 50 qd.   2.  ESRD on HD TTS: HD today.  3.  Volume: up 4kg today, plan UF same on HD 4.  Anemia ckd - Hb 10's no esa needed for now 5.  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 6. L eye pain: improving 7. Dispo: per Pamelia Hoit MD [facility] pgr [phone]   02/15/2018, 11:25 AM   Subjective:   No new c/o    Objective:   BP (!) 166/84   Pulse 72   Temp 98.5 F (36.9 C) (Oral)   Resp 18   Ht 5\' 11"  (1.803 m)   Wt 92.8 kg   SpO2 100%   BMI 28.53 kg/m   OP HD:  TTS SW  4h 33min  500/800  88.5kg  2/2.25  LUA AVF  Hep 2000 - M 30 every 2 wks - venofer 50 mg  - hect 4 ug tiw  - sensipar 90 tiw   Physical Exam: Gen: no distress CVS: Pulse regular rhythm, normal rate, S1 and S2 normal Resp: Clear to auscultation, no rales/rhonchi Abd: Soft, flat, nontender Ext: No lower extremity edema  Labs: BMET Recent Labs  Lab 02/09/18 0353 02/10/18 0634 02/11/18 0116 02/12/18 0411 02/13/18 0324 02/14/18 0458 02/15/18 0710  NA 140 140 132* 140 136 134* 135  K 4.5 4.6 4.9 4.8 6.5* 5.4* 5.4*  CL 97* 100 91* 99 94* 94* 94*  CO2 29 23 25 27 25 26 23   GLUCOSE 74 73 331* 49* 286* 352* 144*  BUN 45* 56* 33* 47* 62* 43* 60*  CREATININE 11.81* 14.03* 9.49* 12.75* 15.21* 10.52* 13.02*  CALCIUM 8.4* 8.4* 8.4* 8.8* 8.2* 8.1* 8.6*  PHOS 5.0* 5.8* 5.1* 7.2* 6.9* 6.9*  --    CBC Recent Labs  Lab 02/08/18 1139 02/09/18 0353 02/12/18 0411 02/13/18 0324 02/15/18 0710  WBC 4.4 4.1 5.1 7.4 5.0  NEUTROABS 2.8  --    --   --   --   HGB 12.5* 12.1* 11.7* 10.3* 10.5*  HCT 41.0 39.4 36.9* 33.5* 33.8*  MCV 90.1 88.5 88.3 90.1 89.4  PLT 278 334 349 353 309   Medications:    . ciprofloxacin  1-2 drop Left Eye Q6H WA  . heparin  5,000 Units Subcutaneous Q8H  . insulin aspart  0-5 Units Subcutaneous TID WC  . insulin aspart  2 Units Subcutaneous TID WC  . insulin glargine  10 Units Subcutaneous QHS  . isosorbide-hydrALAZINE  1 tablet Oral TID  . metoprolol tartrate  50 mg Oral BID  . NIFEdipine  30 mg Oral BID  . spironolactone  50 mg Oral Daily

## 2018-02-17 DIAGNOSIS — N2581 Secondary hyperparathyroidism of renal origin: Secondary | ICD-10-CM | POA: Diagnosis not present

## 2018-02-17 DIAGNOSIS — D509 Iron deficiency anemia, unspecified: Secondary | ICD-10-CM | POA: Diagnosis not present

## 2018-02-17 DIAGNOSIS — E162 Hypoglycemia, unspecified: Secondary | ICD-10-CM | POA: Diagnosis not present

## 2018-02-17 DIAGNOSIS — E8779 Other fluid overload: Secondary | ICD-10-CM | POA: Diagnosis not present

## 2018-02-17 DIAGNOSIS — N186 End stage renal disease: Secondary | ICD-10-CM | POA: Diagnosis not present

## 2018-02-17 DIAGNOSIS — D631 Anemia in chronic kidney disease: Secondary | ICD-10-CM | POA: Diagnosis not present

## 2018-02-20 DIAGNOSIS — E162 Hypoglycemia, unspecified: Secondary | ICD-10-CM | POA: Diagnosis not present

## 2018-02-20 DIAGNOSIS — D631 Anemia in chronic kidney disease: Secondary | ICD-10-CM | POA: Diagnosis not present

## 2018-02-20 DIAGNOSIS — N2581 Secondary hyperparathyroidism of renal origin: Secondary | ICD-10-CM | POA: Diagnosis not present

## 2018-02-20 DIAGNOSIS — D509 Iron deficiency anemia, unspecified: Secondary | ICD-10-CM | POA: Diagnosis not present

## 2018-02-20 DIAGNOSIS — N186 End stage renal disease: Secondary | ICD-10-CM | POA: Diagnosis not present

## 2018-02-20 DIAGNOSIS — E8779 Other fluid overload: Secondary | ICD-10-CM | POA: Diagnosis not present

## 2018-02-22 DIAGNOSIS — D631 Anemia in chronic kidney disease: Secondary | ICD-10-CM | POA: Diagnosis not present

## 2018-02-22 DIAGNOSIS — E8779 Other fluid overload: Secondary | ICD-10-CM | POA: Diagnosis not present

## 2018-02-22 DIAGNOSIS — N186 End stage renal disease: Secondary | ICD-10-CM | POA: Diagnosis not present

## 2018-02-22 DIAGNOSIS — E162 Hypoglycemia, unspecified: Secondary | ICD-10-CM | POA: Diagnosis not present

## 2018-02-22 DIAGNOSIS — N2581 Secondary hyperparathyroidism of renal origin: Secondary | ICD-10-CM | POA: Diagnosis not present

## 2018-02-22 DIAGNOSIS — D509 Iron deficiency anemia, unspecified: Secondary | ICD-10-CM | POA: Diagnosis not present

## 2018-02-24 DIAGNOSIS — D631 Anemia in chronic kidney disease: Secondary | ICD-10-CM | POA: Diagnosis not present

## 2018-02-24 DIAGNOSIS — E162 Hypoglycemia, unspecified: Secondary | ICD-10-CM | POA: Diagnosis not present

## 2018-02-24 DIAGNOSIS — N186 End stage renal disease: Secondary | ICD-10-CM | POA: Diagnosis not present

## 2018-02-24 DIAGNOSIS — D509 Iron deficiency anemia, unspecified: Secondary | ICD-10-CM | POA: Diagnosis not present

## 2018-02-24 DIAGNOSIS — E8779 Other fluid overload: Secondary | ICD-10-CM | POA: Diagnosis not present

## 2018-02-24 DIAGNOSIS — N2581 Secondary hyperparathyroidism of renal origin: Secondary | ICD-10-CM | POA: Diagnosis not present

## 2018-02-26 DIAGNOSIS — E8779 Other fluid overload: Secondary | ICD-10-CM | POA: Diagnosis not present

## 2018-02-26 DIAGNOSIS — D631 Anemia in chronic kidney disease: Secondary | ICD-10-CM | POA: Diagnosis not present

## 2018-02-26 DIAGNOSIS — N186 End stage renal disease: Secondary | ICD-10-CM | POA: Diagnosis not present

## 2018-02-26 DIAGNOSIS — D509 Iron deficiency anemia, unspecified: Secondary | ICD-10-CM | POA: Diagnosis not present

## 2018-02-26 DIAGNOSIS — E162 Hypoglycemia, unspecified: Secondary | ICD-10-CM | POA: Diagnosis not present

## 2018-02-26 DIAGNOSIS — N2581 Secondary hyperparathyroidism of renal origin: Secondary | ICD-10-CM | POA: Diagnosis not present

## 2018-03-01 DIAGNOSIS — D509 Iron deficiency anemia, unspecified: Secondary | ICD-10-CM | POA: Diagnosis not present

## 2018-03-01 DIAGNOSIS — E8779 Other fluid overload: Secondary | ICD-10-CM | POA: Diagnosis not present

## 2018-03-01 DIAGNOSIS — D631 Anemia in chronic kidney disease: Secondary | ICD-10-CM | POA: Diagnosis not present

## 2018-03-01 DIAGNOSIS — E162 Hypoglycemia, unspecified: Secondary | ICD-10-CM | POA: Diagnosis not present

## 2018-03-01 DIAGNOSIS — N2581 Secondary hyperparathyroidism of renal origin: Secondary | ICD-10-CM | POA: Diagnosis not present

## 2018-03-01 DIAGNOSIS — N186 End stage renal disease: Secondary | ICD-10-CM | POA: Diagnosis not present

## 2018-03-03 DIAGNOSIS — N2581 Secondary hyperparathyroidism of renal origin: Secondary | ICD-10-CM | POA: Diagnosis not present

## 2018-03-03 DIAGNOSIS — N186 End stage renal disease: Secondary | ICD-10-CM | POA: Diagnosis not present

## 2018-03-03 DIAGNOSIS — D509 Iron deficiency anemia, unspecified: Secondary | ICD-10-CM | POA: Diagnosis not present

## 2018-03-03 DIAGNOSIS — E8779 Other fluid overload: Secondary | ICD-10-CM | POA: Diagnosis not present

## 2018-03-03 DIAGNOSIS — D631 Anemia in chronic kidney disease: Secondary | ICD-10-CM | POA: Diagnosis not present

## 2018-03-03 DIAGNOSIS — E162 Hypoglycemia, unspecified: Secondary | ICD-10-CM | POA: Diagnosis not present

## 2018-03-07 DIAGNOSIS — Z992 Dependence on renal dialysis: Secondary | ICD-10-CM | POA: Diagnosis not present

## 2018-03-07 DIAGNOSIS — N186 End stage renal disease: Secondary | ICD-10-CM | POA: Diagnosis not present

## 2018-03-07 DIAGNOSIS — E1122 Type 2 diabetes mellitus with diabetic chronic kidney disease: Secondary | ICD-10-CM | POA: Diagnosis not present

## 2018-03-08 DIAGNOSIS — N186 End stage renal disease: Secondary | ICD-10-CM | POA: Diagnosis not present

## 2018-03-08 DIAGNOSIS — D509 Iron deficiency anemia, unspecified: Secondary | ICD-10-CM | POA: Diagnosis not present

## 2018-03-08 DIAGNOSIS — N2581 Secondary hyperparathyroidism of renal origin: Secondary | ICD-10-CM | POA: Diagnosis not present

## 2018-03-08 DIAGNOSIS — E8779 Other fluid overload: Secondary | ICD-10-CM | POA: Diagnosis not present

## 2018-03-10 DIAGNOSIS — E8779 Other fluid overload: Secondary | ICD-10-CM | POA: Diagnosis not present

## 2018-03-10 DIAGNOSIS — N186 End stage renal disease: Secondary | ICD-10-CM | POA: Diagnosis not present

## 2018-03-10 DIAGNOSIS — D509 Iron deficiency anemia, unspecified: Secondary | ICD-10-CM | POA: Diagnosis not present

## 2018-03-10 DIAGNOSIS — N2581 Secondary hyperparathyroidism of renal origin: Secondary | ICD-10-CM | POA: Diagnosis not present

## 2018-03-12 ENCOUNTER — Encounter (HOSPITAL_COMMUNITY): Payer: Self-pay | Admitting: Emergency Medicine

## 2018-03-12 ENCOUNTER — Emergency Department (HOSPITAL_COMMUNITY): Payer: Medicare Other

## 2018-03-12 ENCOUNTER — Emergency Department (HOSPITAL_COMMUNITY)
Admission: EM | Admit: 2018-03-12 | Discharge: 2018-03-13 | Disposition: A | Discharge status: Home / Self Care | Payer: Medicare Other | Source: Home / Self Care | Attending: Emergency Medicine | Admitting: Emergency Medicine

## 2018-03-12 ENCOUNTER — Other Ambulatory Visit: Payer: Self-pay

## 2018-03-12 DIAGNOSIS — E11649 Type 2 diabetes mellitus with hypoglycemia without coma: Secondary | ICD-10-CM | POA: Diagnosis not present

## 2018-03-12 DIAGNOSIS — Z794 Long term (current) use of insulin: Secondary | ICD-10-CM

## 2018-03-12 DIAGNOSIS — R680 Hypothermia, not associated with low environmental temperature: Secondary | ICD-10-CM | POA: Diagnosis not present

## 2018-03-12 DIAGNOSIS — N2581 Secondary hyperparathyroidism of renal origin: Secondary | ICD-10-CM | POA: Diagnosis present

## 2018-03-12 DIAGNOSIS — E10649 Type 1 diabetes mellitus with hypoglycemia without coma: Secondary | ICD-10-CM | POA: Diagnosis not present

## 2018-03-12 DIAGNOSIS — R011 Cardiac murmur, unspecified: Secondary | ICD-10-CM | POA: Diagnosis present

## 2018-03-12 DIAGNOSIS — E1065 Type 1 diabetes mellitus with hyperglycemia: Secondary | ICD-10-CM | POA: Diagnosis not present

## 2018-03-12 DIAGNOSIS — N186 End stage renal disease: Secondary | ICD-10-CM

## 2018-03-12 DIAGNOSIS — Z992 Dependence on renal dialysis: Secondary | ICD-10-CM

## 2018-03-12 DIAGNOSIS — E1042 Type 1 diabetes mellitus with diabetic polyneuropathy: Secondary | ICD-10-CM | POA: Diagnosis present

## 2018-03-12 DIAGNOSIS — I10 Essential (primary) hypertension: Secondary | ICD-10-CM

## 2018-03-12 DIAGNOSIS — I120 Hypertensive chronic kidney disease with stage 5 chronic kidney disease or end stage renal disease: Secondary | ICD-10-CM

## 2018-03-12 DIAGNOSIS — Z9911 Dependence on respirator [ventilator] status: Secondary | ICD-10-CM

## 2018-03-12 DIAGNOSIS — D631 Anemia in chronic kidney disease: Secondary | ICD-10-CM | POA: Diagnosis present

## 2018-03-12 DIAGNOSIS — J984 Other disorders of lung: Secondary | ICD-10-CM | POA: Diagnosis not present

## 2018-03-12 DIAGNOSIS — R55 Syncope and collapse: Secondary | ICD-10-CM | POA: Diagnosis not present

## 2018-03-12 DIAGNOSIS — R569 Unspecified convulsions: Secondary | ICD-10-CM | POA: Diagnosis not present

## 2018-03-12 DIAGNOSIS — R0989 Other specified symptoms and signs involving the circulatory and respiratory systems: Secondary | ICD-10-CM | POA: Diagnosis not present

## 2018-03-12 DIAGNOSIS — F322 Major depressive disorder, single episode, severe without psychotic features: Secondary | ICD-10-CM | POA: Diagnosis present

## 2018-03-12 DIAGNOSIS — J9601 Acute respiratory failure with hypoxia: Secondary | ICD-10-CM | POA: Diagnosis present

## 2018-03-12 DIAGNOSIS — Z79899 Other long term (current) drug therapy: Secondary | ICD-10-CM | POA: Insufficient documentation

## 2018-03-12 DIAGNOSIS — E10319 Type 1 diabetes mellitus with unspecified diabetic retinopathy without macular edema: Secondary | ICD-10-CM | POA: Diagnosis present

## 2018-03-12 DIAGNOSIS — G9341 Metabolic encephalopathy: Secondary | ICD-10-CM | POA: Diagnosis not present

## 2018-03-12 DIAGNOSIS — R4182 Altered mental status, unspecified: Secondary | ICD-10-CM | POA: Diagnosis not present

## 2018-03-12 DIAGNOSIS — R4020 Unspecified coma: Secondary | ICD-10-CM | POA: Diagnosis not present

## 2018-03-12 DIAGNOSIS — Z9114 Patient's other noncompliance with medication regimen: Secondary | ICD-10-CM

## 2018-03-12 DIAGNOSIS — E1122 Type 2 diabetes mellitus with diabetic chronic kidney disease: Secondary | ICD-10-CM | POA: Insufficient documentation

## 2018-03-12 DIAGNOSIS — I422 Other hypertrophic cardiomyopathy: Secondary | ICD-10-CM | POA: Diagnosis not present

## 2018-03-12 DIAGNOSIS — I160 Hypertensive urgency: Secondary | ICD-10-CM | POA: Diagnosis not present

## 2018-03-12 DIAGNOSIS — G40909 Epilepsy, unspecified, not intractable, without status epilepticus: Secondary | ICD-10-CM | POA: Diagnosis not present

## 2018-03-12 DIAGNOSIS — Z4682 Encounter for fitting and adjustment of non-vascular catheter: Secondary | ICD-10-CM | POA: Diagnosis not present

## 2018-03-12 DIAGNOSIS — E1022 Type 1 diabetes mellitus with diabetic chronic kidney disease: Secondary | ICD-10-CM | POA: Diagnosis present

## 2018-03-12 DIAGNOSIS — H5462 Unqualified visual loss, left eye, normal vision right eye: Secondary | ICD-10-CM | POA: Diagnosis present

## 2018-03-12 DIAGNOSIS — E8889 Other specified metabolic disorders: Secondary | ICD-10-CM | POA: Diagnosis present

## 2018-03-12 LAB — TROPONIN I: Troponin I: <0.03 ng/mL (ref <0.03)

## 2018-03-12 LAB — CBC WITH DIFFERENTIAL/PLATELET
Abs Immature Granulocytes: 0 10*3/uL (ref 0.00–0.07)
Basophils Absolute: 0 10*3/uL (ref 0.0–0.1)
Basophils Relative: 0 %
Eosinophils Absolute: 0 10*3/uL (ref 0.0–0.5)
Eosinophils Relative: 0 %
HCT: 38.2 % — ABNORMAL LOW (ref 39.0–52.0)
Hemoglobin: 11.9 g/dL — ABNORMAL LOW (ref 13.0–17.0)
Lymphocytes Relative: 10 %
Lymphs Abs: 0.9 10*3/uL (ref 0.7–4.0)
MCH: 29.4 pg (ref 26.0–34.0)
MCHC: 31.2 g/dL (ref 30.0–36.0)
MCV: 94.3 fL (ref 80.0–100.0)
Monocytes Absolute: 0.7 10*3/uL (ref 0.1–1.0)
Monocytes Relative: 8 %
Neutro Abs: 7.1 10*3/uL (ref 1.7–7.7)
Neutrophils Relative %: 82 %
Platelets: 218 10*3/uL (ref 150–400)
RBC: 4.05 MIL/uL — ABNORMAL LOW (ref 4.22–5.81)
RDW: 14 % (ref 11.5–15.5)
WBC: 8.6 10*3/uL (ref 4.0–10.5)
nRBC: 0 % (ref 0.0–0.2)
nRBC: 0 /100 WBC

## 2018-03-12 LAB — COMPREHENSIVE METABOLIC PANEL
ALT: 18 U/L (ref 0–44)
AST: 37 U/L (ref 15–41)
Albumin: 4 g/dL (ref 3.5–5.0)
Alkaline Phosphatase: 66 U/L (ref 38–126)
Anion gap: 15 (ref 5–15)
BUN: 34 mg/dL — AB (ref 6–20)
CO2: 18 mmol/L — ABNORMAL LOW (ref 22–32)
CREATININE: 13.38 mg/dL — AB (ref 0.61–1.24)
Calcium: 8.6 mg/dL — ABNORMAL LOW (ref 8.9–10.3)
Chloride: 106 mmol/L (ref 98–111)
GFR calc Af Amer: 5 mL/min — ABNORMAL LOW (ref >60)
GFR calc non Af Amer: 4 mL/min — ABNORMAL LOW (ref >60)
Glucose, Bld: 200 mg/dL — ABNORMAL HIGH (ref 70–99)
Potassium: 4.7 mmol/L (ref 3.5–5.1)
Sodium: 139 mmol/L (ref 135–145)
Total Bilirubin: 0.6 mg/dL (ref 0.3–1.2)
Total Protein: 7.9 g/dL (ref 6.5–8.1)

## 2018-03-12 LAB — ETHANOL

## 2018-03-12 LAB — MAGNESIUM: Magnesium: 2.4 mg/dL (ref 1.7–2.4)

## 2018-03-12 LAB — CBG MONITORING, ED: Glucose-Capillary: 195 mg/dL — ABNORMAL HIGH (ref 70–99)

## 2018-03-12 MED ORDER — LEVETIRACETAM IN NACL 1000 MG/100ML IV SOLN: 1000.0000 mg | Freq: Once | INTRAVENOUS | Status: DC

## 2018-03-12 MED ORDER — HYDRALAZINE HCL 20 MG/ML IJ SOLN: 10.0000 mg | INTRAMUSCULAR | Status: DC

## 2018-03-12 NOTE — ED Provider Notes (Signed)
[name] EMERGENCY DEPARTMENT Provider Note   CSN: [id number] Arrival date & time: 03/12/18  2045     History   Chief Complaint Chief Complaint  Patient presents with  . Hypoglycemia  . Seizures    HPI Edward Colon is a 38 y.o. male.  HPI Patient presents via EMS after having an episode of altered mental status, and a fast food restaurant. Patient is listless, but accompanied by his brother who provides some of the history. Brother notes that the patient has history of end-stage renal disease, is on dialysis, has insulin-dependent diabetes, seemingly takes his medication as directed. Reported the patient was at restaurant, in his usual state of health, when he had an episode of shaking, unresponsiveness. EMS notes the patient had glucose of 37 in route, had improvement in his CBG. Patient also received Versed, after seizure-like activity. The patient self cannot provide any details of his HPI, level 5 caveat Past Medical History:  Diagnosis Date  . Anemia   . Blind left eye since ~ 2010  . Depression   . Diabetic peripheral neuropathy ([place])   . ESRD (end stage renal disease) on dialysis [facility])    "TTS; [place] [facility]; [facility]" (02/01/2016)  . Heart murmur    denies any problems with it  . Hypertension   . Seizures ([place])    "last one was end of 2016; they are related to my diabetes" (02/01/2016)  . Type 1 diabetes ([place]) dx'd 1990    Patient Active Problem List   Diagnosis Date Noted  . Hypertensive emergency 02/15/2018  . Encephalopathy, hypertensive 02/15/2018  . Redness of left eye 02/15/2018  . Altered mental status 02/08/2018  . Cardiac arrest ([place])   . DKA (diabetic ketoacidoses) ([place]) 03/05/2017  . Ventricular tachycardia ([place]) 03/05/2017  . Acute respiratory failure ([place])   . Involuntary commitment 01/29/2016  . Suicide attempt by substance overdose ([place]) 01/28/2016  . ESRD (end stage renal disease) ([place]) 03/07/2015  .  Hyperglycemia 03/06/2015  . Acute on chronic renal failure ([place]) 02/27/2015  . URI (upper respiratory infection) 02/27/2015  . Acute-on-chronic kidney injury ([place]) 02/27/2015  . Rhabdomyolysis 08/31/2014  . Hypoglycemia 08/30/2014  . Type 1 diabetes ([place]) 08/30/2014  . CKD stage 2 due to type 2 diabetes mellitus ([place]) 08/30/2014  . Anemia 08/30/2014  . Swelling of arm 05/31/2013  . ARF (acute renal failure) ([place]) 05/31/2013  . Hyperkalemia 05/31/2013  . Seizure ([place]) 05/31/2013  . Seizures ([place])   . Hypertension     Past Surgical History:  Procedure Laterality Date  . AV FISTULA PLACEMENT Right 03/03/2015   Procedure: RIGHT RADIO-CEPHALIC ARTERIOVENOUS (AV) FISTULA CREATION;  Surgeon: Serafina Mitchell, MD;  [facility];  Service: Vascular;  Laterality: Right;  . AV FISTULA PLACEMENT Left 06/15/2015   Procedure: INSERTION OF LEFT UPPER ARM  ARTERIOVENOUS (AV) 44mm x 50cm GORE-TEX GRAFT;  Surgeon: Elam Dutch, MD;  Location: [place];  Service: Vascular;  Laterality: Left;  . BASCILIC VEIN TRANSPOSITION Left 04/01/2015   Procedure: BASCILIC VEIN TRANSPOSITION-LEFT ARM- FIRST STAGE;  Surgeon: Elam Dutch, MD;  Location: [place];  Service: Vascular;  Laterality: Left;  . EYE SURGERY Right ~ 2010   for diabetic retinopathy  . INSERTION OF DIALYSIS CATHETER Right 03/03/2015   Procedure: INSERTION OF DIALYSIS CATHETER;  Surgeon: Serafina Mitchell, MD;  Location: [place];  Service: Vascular;  Laterality: Right;        Home Medications    Prior to  Admission medications   Medication Sig Start Date End Date Taking? Authorizing Provider  calcium acetate (PHOSLO) 667 MG capsule Take 1 capsule (667 mg total) by mouth 3 (three) times daily with meals. Patient taking differently: Take 1,334-2,668 mg by mouth See admin instructions. Take 4 capsules (2668 mg) by mouth with meals and 2 capsules (1334 mg) with snacks 03/07/15  Yes Short, Mackenzie, MD  insulin glargine (LANTUS) 100 UNIT/ML  injection Inject 0.1 mLs (10 Units total) into the skin at bedtime. 02/15/18  Yes Oretha Milch D, MD  isosorbide-hydrALAZINE (BIDIL) 20-37.5 MG tablet Take 1 tablet by mouth 3 (three) times daily. 02/15/18  Yes Oretha Milch D, MD  losartan (COZAAR) 50 MG tablet Take 1 tablet (50 mg total) by mouth daily. 02/15/18   Oretha Milch D, MD  multivitamin (RENA-VIT) TABS tablet Take 1 tablet by mouth at bedtime. Patient taking differently: Take 1 tablet by mouth daily.  03/07/15   [name], MD  NIFEdipine (ADALAT CC) 30 MG 24 hr tablet Take 1 tablet (30 mg total) by mouth 2 (two) times daily. 02/15/18   Oretha Milch D, MD  spironolactone (ALDACTONE) 50 MG tablet Take 1 tablet (50 mg total) by mouth daily. 02/16/18   Desiree Hane, MD    Family History Family History  Problem Relation Age of Onset  . Diabetes Neg Hx     Social History Social History   Tobacco Use  . Smoking status: Never Smoker  . Smokeless tobacco: Never Used  Substance Use Topics  . Alcohol use: No    Frequency: Never    Comment: 02/01/2016 "might have 1 drink/year"  . Drug use: No     Allergies   Patient has no known allergies.   Review of Systems Review of Systems  Unable to perform ROS: Mental status change     Physical Exam Updated Vital Signs BP (!) 160/100 (BP Location: Right Arm)   Pulse 74   Resp 18   SpO2 95%   Physical Exam Vitals signs and nursing note reviewed.  Constitutional:      General: He is not in acute distress.    Appearance: He is well-developed.     Comments: Listless adult male  HENT:     Head: Normocephalic and atraumatic.  Eyes:     Conjunctiva/sclera: Conjunctivae normal.  Cardiovascular:     Rate and Rhythm: Normal rate and regular rhythm.  Pulmonary:     Effort: Pulmonary effort is normal. No respiratory distress.     Breath sounds: No stridor.  Abdominal:     General: There is no distension.  Musculoskeletal:       Arms:  Skin:    General:  Skin is warm and dry.  Neurological:     Comments: Patient moves to painful stimuli, moves all extremities minimally, spontaneously, does not follow commands reliably, initial presentation consistent with postictal state.  Psychiatric:        Cognition and Memory: Cognition is impaired.      ED Treatments / Results  Labs (all labs ordered are listed, but only abnormal results are displayed) Labs Reviewed  COMPREHENSIVE METABOLIC PANEL - Abnormal; Notable for the following components:      Result Value   CO2 18 (*)    Glucose, Bld 200 (*)    BUN 34 (*)    Creatinine, Ser 13.38 (*)    Calcium 8.6 (*)    GFR calc non Af Amer 4 (*)    GFR calc Af Wyvonnia Lora  5 (*)    All other components within normal limits  CBC WITH DIFFERENTIAL/PLATELET - Abnormal; Notable for the following components:   RBC 4.05 (*)    Hemoglobin 11.9 (*)    HCT 38.2 (*)    All other components within normal limits  CBG MONITORING, ED - Abnormal; Notable for the following components:   Glucose-Capillary 195 (*)    All other components within normal limits  ETHANOL  TROPONIN I  MAGNESIUM    EKG EKG Interpretation  Date/Time:  Monday March 12 2018 22:39:47 EST Ventricular Rate:  66 PR Interval:    QRS Duration: 90 QT Interval:  460 QTC Calculation: 482 R Axis:   48 Text Interpretation:  Sinus rhythm Probable left atrial enlargement Left ventricular hypertrophy ST elev, probable normal early repol pattern Baseline wander Artifact Abnormal ekg Confirmed by Carmin Muskrat [phone]) on 03/12/2018 10:56:15 PM   Radiology Dg Chest Port 1 View  Result Date: 03/12/2018 CLINICAL DATA:  Altered mental status. EXAM: PORTABLE CHEST 1 VIEW COMPARISON:  02/08/2018 FINDINGS: Low lung volumes. Stable cardiomegaly. No pulmonary edema, focal airspace disease, pneumothorax or large pleural effusion. No acute osseous abnormalities. IMPRESSION: Low lung volumes without acute abnormality. Stable cardiomegaly. Electronically Signed    By: Keith Rake M.D.   On: 03/12/2018 21:18    Procedures Procedures (including critical care time)  Medications Ordered in ED Medications  levETIRAcetam (KEPPRA) IVPB 1000 mg/100 mL premix (has no administration in time range)  hydrALAZINE (APRESOLINE) injection 10 mg (has no administration in time range)     Initial Impression / Assessment and Plan / ED Course  I have reviewed the triage vital signs and the nursing notes.  Pertinent labs & imaging results that were available during my care of the patient were reviewed by me and considered in my medical decision making (see chart for details).        11:48 PM Patient remains sleeping, though he awakens with stimuli, briefly He is now company by his father, who corroborates history provided by the patient's brother earlier, with concern for medication noncompliance. Father notes patient does have a history of seizure disorder, does not seem to be taking any seizure medication, he is unsure why.  Given concern for seizure disorder, seizure activity night, patient will receive Keppra loading dose, and with persistent blood pressure, will require hydralazine as well. Other initial findings generally reassuring.   12:30 AM  There has been a delay in obtaining IV access, providing Keppra, antihypertensive. Patient remains in no distress.  This patient with insulin-dependent diabetes, end-stage renal disease presents after an episode of hypoglycemia, seizure. Patient received both glucagon and Versed in route, has remained sleeping throughout much of his ED course, with no evidence for distress, but with mild hypertension. With concern for this, patient scheduled to receive IV antihypertensives, antiepileptics, but declined additional IV attempts Patient received oral antihypertensive, antiseizure medication, and if he remains stable, without additional seizure activity, will be discharged with ongoing medications, outpatient  follow-up. Final Clinical Impressions(s) / ED Diagnoses  Seizure Hypertensive urgency   Carmin Muskrat, MD 03/13/18 [phone]

## 2018-03-12 NOTE — ED Triage Notes (Signed)
Pt BIB [facility], pt with family when he "passed out", CBG with EMS 37, given 1mg  glucagon IM, repeat CBG 134. EMS also witnessed pt having a seizure, hx of same, given 5g versed IM. Dialysis pt, Tues/Thurs/Sat.

## 2018-03-12 NOTE — ED Notes (Signed)
CBG 195 mg/dL.

## 2018-03-12 NOTE — ED Notes (Signed)
IV attempted three times. Unable to access.

## 2018-03-13 DIAGNOSIS — N186 End stage renal disease: Secondary | ICD-10-CM | POA: Diagnosis not present

## 2018-03-13 DIAGNOSIS — D509 Iron deficiency anemia, unspecified: Secondary | ICD-10-CM | POA: Diagnosis not present

## 2018-03-13 DIAGNOSIS — E8779 Other fluid overload: Secondary | ICD-10-CM | POA: Diagnosis not present

## 2018-03-13 DIAGNOSIS — N2581 Secondary hyperparathyroidism of renal origin: Secondary | ICD-10-CM | POA: Diagnosis not present

## 2018-03-13 DIAGNOSIS — R569 Unspecified convulsions: Secondary | ICD-10-CM | POA: Diagnosis not present

## 2018-03-13 DIAGNOSIS — R55 Syncope and collapse: Secondary | ICD-10-CM | POA: Diagnosis not present

## 2018-03-13 MED ORDER — SPIRONOLACTONE 50 MG PO TABS: 50.0000 mg | ORAL_TABLET | Freq: Every day | ORAL | 0 refills | Status: DC

## 2018-03-13 MED ORDER — LEVETIRACETAM 500 MG PO TABS: 500.0000 mg | ORAL_TABLET | Freq: Two times a day (BID) | ORAL | 2 refills | Status: DC

## 2018-03-13 MED ORDER — NIFEDIPINE ER OSMOTIC RELEASE 30 MG PO TB24
30.0000 mg | ORAL_TABLET | Freq: Once | ORAL | Status: AC
  Administered 2018-03-13: 30 mg via ORAL
  Filled 2018-03-13: qty 1

## 2018-03-13 MED ORDER — NIFEDIPINE ER 30 MG PO TB24: 30.0000 mg | ORAL_TABLET | Freq: Two times a day (BID) | ORAL | 0 refills | Status: DC

## 2018-03-13 MED ORDER — SPIRONOLACTONE 50 MG PO TABS
50.0000 mg | ORAL_TABLET | Freq: Once | ORAL | Status: AC
  Administered 2018-03-13: 50 mg via ORAL
  Filled 2018-03-13: qty 1

## 2018-03-13 MED ORDER — ISOSORB DINITRATE-HYDRALAZINE 20-37.5 MG PO TABS
1.0000 | ORAL_TABLET | Freq: Once | ORAL | Status: AC
  Administered 2018-03-13: 1 via ORAL
  Filled 2018-03-13: qty 1

## 2018-03-13 MED ORDER — LEVETIRACETAM 500 MG PO TABS
500.0000 mg | ORAL_TABLET | Freq: Once | ORAL | Status: AC
  Administered 2018-03-13: 500 mg via ORAL
  Filled 2018-03-13: qty 1

## 2018-03-13 MED ORDER — AMLODIPINE BESYLATE 5 MG PO TABS
10.0000 mg | ORAL_TABLET | Freq: Once | ORAL | Status: AC
  Administered 2018-03-13: 10 mg via ORAL
  Filled 2018-03-13: qty 2

## 2018-03-13 MED ORDER — ISOSORB DINITRATE-HYDRALAZINE 20-37.5 MG PO TABS: 1.0000 | ORAL_TABLET | Freq: Three times a day (TID) | ORAL | 0 refills | Status: DC

## 2018-03-13 MED ORDER — LOSARTAN POTASSIUM 50 MG PO TABS
50.0000 mg | ORAL_TABLET | Freq: Once | ORAL | Status: AC
  Administered 2018-03-13: 50 mg via ORAL
  Filled 2018-03-13: qty 1

## 2018-03-13 MED ORDER — LOSARTAN POTASSIUM 50 MG PO TABS: 50.0000 mg | ORAL_TABLET | Freq: Every day | ORAL | 0 refills | Status: DC

## 2018-03-13 NOTE — ED Provider Notes (Addendum)
12:10 AM  Assumed care from Dr. Vanita Panda.  Patient is a 38 year old male with history of epilepsy not on seizure medications, hypertension, end-stage renal disease who is dialyzed Tuesday, Thursday and Saturday who was last dialyzed on Saturday the fourth who presents today with seizure.  Labs show no significant change from his baseline.  He is hypertensive here.  Will receive IV hydralazine and Keppra.  Head CT pending.  Will need to recheck mental status and blood pressure after medications.  12:40 AM  Pt now oriented x3.  Attempted to obtain peripheral IV multiple times without success.  Patient refuses further peripheral IV sticks.  He states he has a history of having difficulty with staff finding peripheral IVs.  Discussed risk and benefits of receiving IV medication for his blood pressure which is now the 200s/100s as well as IV seizure medicine.  He verbalized understanding and still refuses IV but agrees to oral medications.  He states he is compliant with his losartan and spironolactone but states his blood pressure is "always up".  Will give oral doses of his medications here and add on amlodipine.  Will give oral Keppra and discharge with prescription of the same.  He agrees to allowing [place] to monitor him a little bit longer to ensure his blood pressure has improved.  1:10 AM  Pt refusing to allow [place] to check a temperature on him.  His axillary temperature is 94.6.  Have explained to him why I am concerned about this temperature measuring so low and why it is dangerous and would like to at least check an oral temperature which he refuses.  Patient states "with all your technology you can't just swipe something across my forehead".  Explained to him why an oral temperature would be more accurate.  He is asking for something to eat which we are getting for him.  He becomes increasingly agitated.  Refusing repeat blood sugar checks today.  He agrees to taking the oral medication but no longer wants to  stay to have his blood pressure rechecked.  I feel at this point patient can be discharged home since he is refusing further monitoring.  He understands risk of benefits of leaving without further monitoring in the ED.  He states he has dialysis at 5 AM.  Will discharge with prescriptions of his blood pressure medication as well as Keppra.  I feel that patient is very unlikely to take any of his medications as prescribed.  I suspect that he is likely medically noncompliant with his blood pressure medications given his hypertension today.  He is not having any symptoms.  It also seems that he has a known history of epilepsy and is not taking any medications for seizures.  Have advised him against driving and will give outpatient neurology follow-up as well.   Nurse was able to recheck his blood pressure just prior to discharge and is 223/119.  He refuses to be monitored any longer.  This may improve after dialysis.  We have discussed symptoms to be watchful of.  It does not appear that his blood pressure is normally this high on evaluation of his previous visits.  He was 173/98 earlier but now that he has become more agitated his blood pressure continues to rise.  I do not feel he needs to sign out [place] at this time but we have had a lengthy discussion regarding his blood pressure and seizure management.  Father at bedside during this discussion as well.  Patient  denies headache, vision changes, numbness, weakness, chest pain, shortness of breath.  He is oriented x4, has capacity, does not appear intoxicated and is able to verbalize risks back to me.    I have reviewed and discussed all results (EKG, imaging, lab, urine as appropriate) and exam findings with patient/family. I have reviewed nursing notes and appropriate previous records.  I feel the patient is safe to be discharged home without further emergent workup and can continue workup as an outpatient as needed. Discussed usual and  customary return precautions. Patient/family verbalize understanding and are comfortable with this plan.  Outpatient follow-up has been provided as needed. All questions have been answered.        Ward, Delice Bison, DO 03/13/18 [phone]

## 2018-03-13 NOTE — ED Notes (Signed)
Pt left AMA °

## 2018-03-13 NOTE — Discharge Instructions (Addendum)
We have provided you with a refill of all of your blood pressure medications today.  Please take these medications as prescribed.  Your blood pressure was extremely elevated here in the emergency department which can put you at risk for life-threatening illness including heart attacks, strokes.  I have also discharged with a prescription of Keppra given your history of epilepsy.  I recommend close follow-up with a neurologist as an outpatient.  You should not drive, operate heavy machinery or perform any activity that may be dangerous to yourself or others for at least 6 months or until you have been cleared by a neurologist.

## 2018-03-14 ENCOUNTER — Inpatient Hospital Stay (HOSPITAL_COMMUNITY)
Admission: EM | Admit: 2018-03-14 | Discharge: 2018-03-28 | DRG: 637 | Disposition: A | Discharge status: Other Acute Inpatient Hospital | Payer: Medicare Other | Attending: Family Medicine | Admitting: Family Medicine

## 2018-03-14 ENCOUNTER — Emergency Department (HOSPITAL_COMMUNITY): Payer: Medicare Other

## 2018-03-14 ENCOUNTER — Inpatient Hospital Stay (HOSPITAL_COMMUNITY): Payer: Medicare Other

## 2018-03-14 ENCOUNTER — Encounter (HOSPITAL_COMMUNITY): Payer: Self-pay | Admitting: Emergency Medicine

## 2018-03-14 ENCOUNTER — Other Ambulatory Visit: Payer: Self-pay

## 2018-03-14 DIAGNOSIS — H5462 Unqualified visual loss, left eye, normal vision right eye: Secondary | ICD-10-CM | POA: Diagnosis present

## 2018-03-14 DIAGNOSIS — Z9114 Patient's other noncompliance with medication regimen: Secondary | ICD-10-CM | POA: Diagnosis not present

## 2018-03-14 DIAGNOSIS — N179 Acute kidney failure, unspecified: Secondary | ICD-10-CM | POA: Diagnosis present

## 2018-03-14 DIAGNOSIS — R569 Unspecified convulsions: Secondary | ICD-10-CM | POA: Diagnosis not present

## 2018-03-14 DIAGNOSIS — T68XXXA Hypothermia, initial encounter: Secondary | ICD-10-CM

## 2018-03-14 DIAGNOSIS — E11649 Type 2 diabetes mellitus with hypoglycemia without coma: Secondary | ICD-10-CM | POA: Diagnosis not present

## 2018-03-14 DIAGNOSIS — E8889 Other specified metabolic disorders: Secondary | ICD-10-CM | POA: Diagnosis present

## 2018-03-14 DIAGNOSIS — Z4682 Encounter for fitting and adjustment of non-vascular catheter: Secondary | ICD-10-CM | POA: Diagnosis not present

## 2018-03-14 DIAGNOSIS — I120 Hypertensive chronic kidney disease with stage 5 chronic kidney disease or end stage renal disease: Secondary | ICD-10-CM | POA: Diagnosis present

## 2018-03-14 DIAGNOSIS — E1022 Type 1 diabetes mellitus with diabetic chronic kidney disease: Secondary | ICD-10-CM | POA: Diagnosis present

## 2018-03-14 DIAGNOSIS — R404 Transient alteration of awareness: Secondary | ICD-10-CM | POA: Diagnosis not present

## 2018-03-14 DIAGNOSIS — Z992 Dependence on renal dialysis: Secondary | ICD-10-CM | POA: Diagnosis not present

## 2018-03-14 DIAGNOSIS — E1140 Type 2 diabetes mellitus with diabetic neuropathy, unspecified: Secondary | ICD-10-CM

## 2018-03-14 DIAGNOSIS — F322 Major depressive disorder, single episode, severe without psychotic features: Secondary | ICD-10-CM | POA: Diagnosis present

## 2018-03-14 DIAGNOSIS — J9601 Acute respiratory failure with hypoxia: Secondary | ICD-10-CM | POA: Diagnosis present

## 2018-03-14 DIAGNOSIS — H5440 Blindness, one eye, unspecified eye: Secondary | ICD-10-CM | POA: Diagnosis not present

## 2018-03-14 DIAGNOSIS — G9340 Encephalopathy, unspecified: Secondary | ICD-10-CM | POA: Diagnosis not present

## 2018-03-14 DIAGNOSIS — F202 Catatonic schizophrenia: Secondary | ICD-10-CM | POA: Diagnosis present

## 2018-03-14 DIAGNOSIS — R4182 Altered mental status, unspecified: Secondary | ICD-10-CM | POA: Diagnosis present

## 2018-03-14 DIAGNOSIS — R195 Other fecal abnormalities: Secondary | ICD-10-CM | POA: Diagnosis not present

## 2018-03-14 DIAGNOSIS — E1041 Type 1 diabetes mellitus with diabetic mononeuropathy: Secondary | ICD-10-CM | POA: Diagnosis not present

## 2018-03-14 DIAGNOSIS — Z01818 Encounter for other preprocedural examination: Secondary | ICD-10-CM

## 2018-03-14 DIAGNOSIS — R680 Hypothermia, not associated with low environmental temperature: Secondary | ICD-10-CM | POA: Diagnosis present

## 2018-03-14 DIAGNOSIS — E10319 Type 1 diabetes mellitus with unspecified diabetic retinopathy without macular edema: Secondary | ICD-10-CM | POA: Diagnosis present

## 2018-03-14 DIAGNOSIS — R451 Restlessness and agitation: Secondary | ICD-10-CM

## 2018-03-14 DIAGNOSIS — E160 Drug-induced hypoglycemia without coma: Secondary | ICD-10-CM | POA: Diagnosis not present

## 2018-03-14 DIAGNOSIS — E569 Vitamin deficiency, unspecified: Secondary | ICD-10-CM | POA: Diagnosis present

## 2018-03-14 DIAGNOSIS — G9341 Metabolic encephalopathy: Secondary | ICD-10-CM | POA: Diagnosis present

## 2018-03-14 DIAGNOSIS — I132 Hypertensive heart and chronic kidney disease with heart failure and with stage 5 chronic kidney disease, or end stage renal disease: Secondary | ICD-10-CM | POA: Diagnosis present

## 2018-03-14 DIAGNOSIS — E1042 Type 1 diabetes mellitus with diabetic polyneuropathy: Secondary | ICD-10-CM | POA: Diagnosis present

## 2018-03-14 DIAGNOSIS — T383X5A Adverse effect of insulin and oral hypoglycemic [antidiabetic] drugs, initial encounter: Secondary | ICD-10-CM | POA: Diagnosis not present

## 2018-03-14 DIAGNOSIS — E162 Hypoglycemia, unspecified: Secondary | ICD-10-CM

## 2018-03-14 DIAGNOSIS — J9690 Respiratory failure, unspecified, unspecified whether with hypoxia or hypercapnia: Secondary | ICD-10-CM | POA: Diagnosis not present

## 2018-03-14 DIAGNOSIS — R011 Cardiac murmur, unspecified: Secondary | ICD-10-CM | POA: Diagnosis present

## 2018-03-14 DIAGNOSIS — Z79899 Other long term (current) drug therapy: Secondary | ICD-10-CM | POA: Diagnosis not present

## 2018-03-14 DIAGNOSIS — R401 Stupor: Secondary | ICD-10-CM | POA: Diagnosis not present

## 2018-03-14 DIAGNOSIS — R0689 Other abnormalities of breathing: Secondary | ICD-10-CM | POA: Diagnosis not present

## 2018-03-14 DIAGNOSIS — Z9911 Dependence on respirator [ventilator] status: Secondary | ICD-10-CM | POA: Diagnosis not present

## 2018-03-14 DIAGNOSIS — Z794 Long term (current) use of insulin: Secondary | ICD-10-CM | POA: Diagnosis not present

## 2018-03-14 DIAGNOSIS — E1065 Type 1 diabetes mellitus with hyperglycemia: Secondary | ICD-10-CM | POA: Diagnosis not present

## 2018-03-14 DIAGNOSIS — I509 Heart failure, unspecified: Secondary | ICD-10-CM | POA: Diagnosis present

## 2018-03-14 DIAGNOSIS — N2581 Secondary hyperparathyroidism of renal origin: Secondary | ICD-10-CM | POA: Diagnosis present

## 2018-03-14 DIAGNOSIS — E10649 Type 1 diabetes mellitus with hypoglycemia without coma: Secondary | ICD-10-CM | POA: Diagnosis present

## 2018-03-14 DIAGNOSIS — N186 End stage renal disease: Secondary | ICD-10-CM | POA: Diagnosis present

## 2018-03-14 DIAGNOSIS — Z4659 Encounter for fitting and adjustment of other gastrointestinal appliance and device: Secondary | ICD-10-CM

## 2018-03-14 DIAGNOSIS — Z978 Presence of other specified devices: Secondary | ICD-10-CM

## 2018-03-14 DIAGNOSIS — R4020 Unspecified coma: Secondary | ICD-10-CM | POA: Diagnosis not present

## 2018-03-14 DIAGNOSIS — F332 Major depressive disorder, recurrent severe without psychotic features: Secondary | ICD-10-CM | POA: Diagnosis present

## 2018-03-14 DIAGNOSIS — J9600 Acute respiratory failure, unspecified whether with hypoxia or hypercapnia: Secondary | ICD-10-CM | POA: Diagnosis not present

## 2018-03-14 DIAGNOSIS — D631 Anemia in chronic kidney disease: Secondary | ICD-10-CM | POA: Diagnosis present

## 2018-03-14 DIAGNOSIS — Z915 Personal history of self-harm: Secondary | ICD-10-CM | POA: Diagnosis not present

## 2018-03-14 DIAGNOSIS — R0989 Other specified symptoms and signs involving the circulatory and respiratory systems: Secondary | ICD-10-CM | POA: Diagnosis not present

## 2018-03-14 DIAGNOSIS — J9811 Atelectasis: Secondary | ICD-10-CM | POA: Diagnosis not present

## 2018-03-14 DIAGNOSIS — R45851 Suicidal ideations: Secondary | ICD-10-CM | POA: Diagnosis not present

## 2018-03-14 DIAGNOSIS — E161 Other hypoglycemia: Secondary | ICD-10-CM | POA: Diagnosis not present

## 2018-03-14 LAB — GLUCOSE, CAPILLARY
GLUCOSE-CAPILLARY: 166 mg/dL — AB (ref 70–99)
Glucose-Capillary: 107 mg/dL — ABNORMAL HIGH (ref 70–99)
Glucose-Capillary: 112 mg/dL — ABNORMAL HIGH (ref 70–99)
Glucose-Capillary: 120 mg/dL — ABNORMAL HIGH (ref 70–99)
Glucose-Capillary: 122 mg/dL — ABNORMAL HIGH (ref 70–99)
Glucose-Capillary: 145 mg/dL — ABNORMAL HIGH (ref 70–99)
Glucose-Capillary: 160 mg/dL — ABNORMAL HIGH (ref 70–99)
Glucose-Capillary: 180 mg/dL — ABNORMAL HIGH (ref 70–99)
Glucose-Capillary: 185 mg/dL — ABNORMAL HIGH (ref 70–99)
Glucose-Capillary: 216 mg/dL — ABNORMAL HIGH (ref 70–99)
Glucose-Capillary: 223 mg/dL — ABNORMAL HIGH (ref 70–99)
Glucose-Capillary: 226 mg/dL — ABNORMAL HIGH (ref 70–99)
Glucose-Capillary: 242 mg/dL — ABNORMAL HIGH (ref 70–99)
Glucose-Capillary: 243 mg/dL — ABNORMAL HIGH (ref 70–99)
Glucose-Capillary: 62 mg/dL — ABNORMAL LOW (ref 70–99)
Glucose-Capillary: 94 mg/dL (ref 70–99)

## 2018-03-14 LAB — I-STAT ARTERIAL BLOOD GAS, ED
Acid-Base Excess: 7 mmol/L — ABNORMAL HIGH (ref 0.0–2.0)
BICARBONATE: 32.8 mmol/L — AB (ref 20.0–28.0)
O2 Saturation: 100 %
TCO2: 34 mmol/L — AB (ref 22–32)
pCO2 arterial: 50.8 mmHg — ABNORMAL HIGH (ref 32.0–48.0)
pH, Arterial: 7.417 (ref 7.350–7.450)
pO2, Arterial: 348 mmHg — ABNORMAL HIGH (ref 83.0–108.0)

## 2018-03-14 LAB — CBG MONITORING, ED
Glucose-Capillary: 56 mg/dL — ABNORMAL LOW (ref 70–99)
Glucose-Capillary: 75 mg/dL (ref 70–99)
Glucose-Capillary: 113 mg/dL — ABNORMAL HIGH (ref 70–99)
Glucose-Capillary: 19 mg/dL — CL (ref 70–99)
Glucose-Capillary: <10 mg/dL — CL (ref 70–99)
Glucose-Capillary: 225 mg/dL — ABNORMAL HIGH (ref 70–99)
Glucose-Capillary: 155 mg/dL — ABNORMAL HIGH (ref 70–99)
Glucose-Capillary: 27 mg/dL — CL (ref 70–99)
Glucose-Capillary: 84 mg/dL (ref 70–99)

## 2018-03-14 LAB — COMPREHENSIVE METABOLIC PANEL
ALT: 22 U/L (ref 0–44)
AST: 41 U/L (ref 15–41)
Albumin: 3.6 g/dL (ref 3.5–5.0)
Alkaline Phosphatase: 66 U/L (ref 38–126)
Anion gap: 14 (ref 5–15)
BUN: 28 mg/dL — ABNORMAL HIGH (ref 6–20)
CO2: 26 mmol/L (ref 22–32)
Calcium: 8.2 mg/dL — ABNORMAL LOW (ref 8.9–10.3)
Chloride: 100 mmol/L (ref 98–111)
Creatinine, Ser: 10.85 mg/dL — ABNORMAL HIGH (ref 0.61–1.24)
GFR calc Af Amer: 6 mL/min — ABNORMAL LOW (ref >60)
GFR calc non Af Amer: 5 mL/min — ABNORMAL LOW (ref >60)
Glucose, Bld: 155 mg/dL — ABNORMAL HIGH (ref 70–99)
Potassium: 4.5 mmol/L (ref 3.5–5.1)
Sodium: 140 mmol/L (ref 135–145)
Total Bilirubin: 0.9 mg/dL (ref 0.3–1.2)
Total Protein: 7.7 g/dL (ref 6.5–8.1)

## 2018-03-14 LAB — URINALYSIS, ROUTINE W REFLEX MICROSCOPIC
BACTERIA UA: NONE SEEN
Bilirubin Urine: NEGATIVE
Glucose, UA: >=500 mg/dL — AB
Hgb urine dipstick: NEGATIVE
Ketones, ur: NEGATIVE mg/dL
Leukocytes, UA: NEGATIVE
Nitrite: NEGATIVE
Protein, ur: >=300 mg/dL — AB
SPECIFIC GRAVITY, URINE: 1.014 (ref 1.005–1.030)
pH: 8 (ref 5.0–8.0)

## 2018-03-14 LAB — I-STAT CG4 LACTIC ACID, ED
LACTIC ACID, VENOUS: 1.75 mmol/L (ref 0.5–1.9)
Lactic Acid, Venous: 3.9 mmol/L (ref 0.5–1.9)
Lactic Acid, Venous: 1.2 mmol/L (ref 0.5–1.9)

## 2018-03-14 LAB — CBC WITH DIFFERENTIAL/PLATELET
Abs Immature Granulocytes: 0.02 10*3/uL (ref 0.00–0.07)
Basophils Absolute: 0 10*3/uL (ref 0.0–0.1)
Basophils Relative: 0 %
Eosinophils Absolute: 0 10*3/uL (ref 0.0–0.5)
Eosinophils Relative: 0 %
HCT: 41.6 % (ref 39.0–52.0)
HEMOGLOBIN: 12.8 g/dL — AB (ref 13.0–17.0)
Immature Granulocytes: 0 %
LYMPHS ABS: 0.8 10*3/uL (ref 0.7–4.0)
LYMPHS PCT: 18 %
MCH: 29.2 pg (ref 26.0–34.0)
MCHC: 30.8 g/dL (ref 30.0–36.0)
MCV: 95 fL (ref 80.0–100.0)
Monocytes Absolute: 0.4 10*3/uL (ref 0.1–1.0)
Monocytes Relative: 9 %
Neutro Abs: 3.3 10*3/uL (ref 1.7–7.7)
Neutrophils Relative %: 73 %
Platelets: 313 10*3/uL (ref 150–400)
RBC: 4.38 MIL/uL (ref 4.22–5.81)
RDW: 14.2 % (ref 11.5–15.5)
WBC: 4.5 10*3/uL (ref 4.0–10.5)
nRBC: 0 % (ref 0.0–0.2)

## 2018-03-14 LAB — I-STAT CHEM 8, ED
BUN: 30 mg/dL — ABNORMAL HIGH (ref 6–20)
CALCIUM ION: 0.99 mmol/L — AB (ref 1.15–1.40)
Chloride: 99 mmol/L (ref 98–111)
Creatinine, Ser: 10.9 mg/dL — ABNORMAL HIGH (ref 0.61–1.24)
Glucose, Bld: 345 mg/dL — ABNORMAL HIGH (ref 70–99)
HCT: 36 % — ABNORMAL LOW (ref 39.0–52.0)
Hemoglobin: 12.2 g/dL — ABNORMAL LOW (ref 13.0–17.0)
Potassium: 4.3 mmol/L (ref 3.5–5.1)
Sodium: 138 mmol/L (ref 135–145)
TCO2: 28 mmol/L (ref 22–32)

## 2018-03-14 LAB — APTT: aPTT: 30 seconds (ref 24–36)

## 2018-03-14 LAB — PROTIME-INR
INR: 1
Prothrombin Time: 13.1 seconds (ref 11.4–15.2)

## 2018-03-14 LAB — MRSA PCR SCREENING: MRSA by PCR: NEGATIVE

## 2018-03-14 LAB — LACTIC ACID, PLASMA: Lactic Acid, Venous: 4 mmol/L (ref 0.5–1.9)

## 2018-03-14 LAB — PROCALCITONIN: Procalcitonin: 5.41 ng/mL

## 2018-03-14 MED ORDER — DEXTROSE 50 % IV SOLN
INTRAVENOUS | Status: AC
  Administered 2018-03-14: 25 mL
  Filled 2018-03-14: qty 50

## 2018-03-14 MED ORDER — HEPARIN SODIUM (PORCINE) 5000 UNIT/ML IJ SOLN
5000.0000 [IU] | Freq: Three times a day (TID) | INTRAMUSCULAR | Status: DC
  Administered 2018-03-14 – 2018-03-28 (×30): 5000 [IU] via SUBCUTANEOUS
  Filled 2018-03-14 (×32): qty 1

## 2018-03-14 MED ORDER — DEXTROSE 50 % IV SOLN
INTRAVENOUS | Status: AC
  Administered 2018-03-14: 50 mL
  Filled 2018-03-14: qty 50

## 2018-03-14 MED ORDER — DEXTROSE 50 % IV SOLN
INTRAVENOUS | Status: AC
  Filled 2018-03-14: qty 50

## 2018-03-14 MED ORDER — MIDAZOLAM HCL 2 MG/2ML IJ SOLN
2.0000 mg | Freq: Once | INTRAMUSCULAR | Status: AC
  Administered 2018-03-14: 2 mg via INTRAVENOUS
  Filled 2018-03-14: qty 2

## 2018-03-14 MED ORDER — FENTANYL CITRATE (PF) 100 MCG/2ML IJ SOLN
INTRAMUSCULAR | Status: AC
  Filled 2018-03-14: qty 2

## 2018-03-14 MED ORDER — CHLORHEXIDINE GLUCONATE 0.12% ORAL RINSE (MEDLINE KIT)
15.0000 mL | Freq: Two times a day (BID) | OROMUCOSAL | Status: DC
  Administered 2018-03-14 – 2018-03-17 (×6): 15 mL via OROMUCOSAL

## 2018-03-14 MED ORDER — ACETAMINOPHEN 325 MG PO TABS
650.0000 mg | ORAL_TABLET | ORAL | Status: DC | PRN
  Administered 2018-03-23: 650 mg via ORAL
  Filled 2018-03-14 (×2): qty 2

## 2018-03-14 MED ORDER — ETOMIDATE 2 MG/ML IV SOLN
0.3000 mg/kg | Freq: Once | INTRAVENOUS | Status: AC
  Administered 2018-03-14: 09:00:00 via INTRAVENOUS

## 2018-03-14 MED ORDER — ROCURONIUM BROMIDE 50 MG/5ML IV SOLN
1.0000 mg/kg | Freq: Once | INTRAVENOUS | Status: AC
  Administered 2018-03-14: 09:00:00 via INTRAVENOUS

## 2018-03-14 MED ORDER — LORAZEPAM 2 MG/ML IJ SOLN
1.0000 mg | Freq: Once | INTRAMUSCULAR | Status: AC
  Administered 2018-03-14: 1 mg via INTRAVENOUS
  Filled 2018-03-14: qty 1

## 2018-03-14 MED ORDER — ORAL CARE MOUTH RINSE
15.0000 mL | OROMUCOSAL | Status: DC
  Administered 2018-03-15 – 2018-03-17 (×22): 15 mL via OROMUCOSAL

## 2018-03-14 MED ORDER — DEXTROSE 50 % IV SOLN
INTRAVENOUS | Status: AC
  Administered 2018-03-14: 12:00:00
  Filled 2018-03-14: qty 50

## 2018-03-14 MED ORDER — DEXTROSE 10 % IV SOLN
INTRAVENOUS | Status: DC
  Administered 2018-03-14: 09:00:00 via INTRAVENOUS

## 2018-03-14 MED ORDER — VANCOMYCIN HCL 10 G IV SOLR
2000.0000 mg | Freq: Once | INTRAVENOUS | Status: DC
  Filled 2018-03-14: qty 2000

## 2018-03-14 MED ORDER — CHLORHEXIDINE GLUCONATE CLOTH 2 % EX PADS
6.0000 | MEDICATED_PAD | Freq: Every day | CUTANEOUS | Status: DC
  Administered 2018-03-16: 6 via TOPICAL

## 2018-03-14 MED ORDER — FENTANYL CITRATE (PF) 100 MCG/2ML IJ SOLN
100.0000 ug | Freq: Once | INTRAMUSCULAR | Status: AC
  Administered 2018-03-14: 100 ug via INTRAVENOUS
  Filled 2018-03-14: qty 2

## 2018-03-14 MED ORDER — ROCURONIUM BROMIDE 50 MG/5ML IV SOLN
INTRAVENOUS | Status: AC | PRN
  Administered 2018-03-14: 100 mg via INTRAVENOUS

## 2018-03-14 MED ORDER — ONDANSETRON HCL 4 MG/2ML IJ SOLN: 4.0000 mg | Freq: Four times a day (QID) | INTRAMUSCULAR | Status: DC | PRN

## 2018-03-14 MED ORDER — FENTANYL CITRATE (PF) 100 MCG/2ML IJ SOLN
50.0000 ug | INTRAMUSCULAR | Status: DC | PRN
  Administered 2018-03-14 – 2018-03-15 (×4): 50 ug via INTRAVENOUS
  Filled 2018-03-14 (×4): qty 2

## 2018-03-14 MED ORDER — SODIUM CHLORIDE 0.9 % IV SOLN: 2.0000 g | Freq: Once | INTRAVENOUS | Status: DC

## 2018-03-14 MED ORDER — ETOMIDATE 2 MG/ML IV SOLN
INTRAVENOUS | Status: AC | PRN
  Administered 2018-03-14: 20 mg via INTRAVENOUS

## 2018-03-14 MED ORDER — PANTOPRAZOLE SODIUM 40 MG IV SOLR
40.0000 mg | Freq: Every day | INTRAVENOUS | Status: DC
  Administered 2018-03-14 – 2018-03-16 (×3): 40 mg via INTRAVENOUS
  Filled 2018-03-14 (×3): qty 40

## 2018-03-14 MED ORDER — DEXTROSE 50 % IV SOLN
2.0000 | Freq: Once | INTRAVENOUS | Status: AC
  Administered 2018-03-14: 100 mL via INTRAVENOUS
  Administered 2018-03-14: 08:00:00 via INTRAVENOUS

## 2018-03-14 MED ORDER — INSULIN ASPART 100 UNIT/ML ~~LOC~~ SOLN: 0.0000 [IU] | SUBCUTANEOUS | Status: DC

## 2018-03-14 MED ORDER — PROPOFOL 1000 MG/100ML IV EMUL
5.0000 ug/kg/min | INTRAVENOUS | Status: DC
  Administered 2018-03-14: 30 ug/kg/min via INTRAVENOUS
  Administered 2018-03-14 – 2018-03-15 (×3): 10 ug/kg/min via INTRAVENOUS
  Filled 2018-03-14 (×2): qty 100
  Filled 2018-03-14: qty 200

## 2018-03-14 MED ORDER — DEXTROSE 50 % IV SOLN
INTRAVENOUS | Status: AC
  Administered 2018-03-14: 50 mL
  Filled 2018-03-14: qty 100

## 2018-03-14 MED ORDER — PROPOFOL 1000 MG/100ML IV EMUL
INTRAVENOUS | Status: AC
  Administered 2018-03-14: 10 ug/kg/min via INTRAVENOUS
  Filled 2018-03-14: qty 100

## 2018-03-14 MED FILL — Rocuronium Bromide IV Soln 50 MG/5ML (10 MG/ML): INTRAVENOUS | Qty: 90.4 | Status: AC

## 2018-03-14 NOTE — Consult Note (Addendum)
Renal Service Consult Note [facility]  Edward Colon 03/14/2018 [name] Requesting Physician:  Dr. Johnney Killian  Reason for Consult:  ESRD pt w/ LOC episode HPI: The patient is a 38 y.o. year-old with hx of HTN, IDDM type 1, hx seizures, neuropathy, and ESRD on HD TTS.   Found to have hypoglycemia, improving now. Got multiple amps D50 in the ED.  Intubated ,on the vent and on propofol gtt. Asked to see for ESRD.   Patient hasn't missed any HD recently.  Had HD yesterday.  Not able to get any history.   ROS  n/a    Past Medical History  Past Medical History:  Diagnosis Date  . Anemia   . Blind left eye since ~ 2010  . Depression   . Diabetic peripheral neuropathy ([place])   . ESRD (end stage renal disease) on dialysis [facility])    "TTS; [place] [facility]; [facility]" (02/01/2016)  . Heart murmur    denies any problems with it  . Hypertension   . Seizures ([place])    "last one was end of 2016; they are related to my diabetes" (02/01/2016)  . Type 1 diabetes ([place]) dx'd 1990   Past Surgical History  Past Surgical History:  Procedure Laterality Date  . AV FISTULA PLACEMENT Right 03/03/2015   Procedure: RIGHT RADIO-CEPHALIC ARTERIOVENOUS (AV) FISTULA CREATION;  Surgeon: Serafina Mitchell, MD;  [facility];  Service: Vascular;  Laterality: Right;  . AV FISTULA PLACEMENT Left 06/15/2015   Procedure: INSERTION OF LEFT UPPER ARM  ARTERIOVENOUS (AV) 11mm x 50cm GORE-TEX GRAFT;  Surgeon: Elam Dutch, MD;  Location: [place];  Service: Vascular;  Laterality: Left;  . BASCILIC VEIN TRANSPOSITION Left 04/01/2015   Procedure: BASCILIC VEIN TRANSPOSITION-LEFT ARM- FIRST STAGE;  Surgeon: Elam Dutch, MD;  Location: [place];  Service: Vascular;  Laterality: Left;  . EYE SURGERY Right ~ 2010   for diabetic retinopathy  . INSERTION OF DIALYSIS CATHETER Right 03/03/2015   Procedure: INSERTION OF DIALYSIS CATHETER;  Surgeon: Serafina Mitchell, MD;  Location[facility] OR;  Service: Vascular;   Laterality: Right;   Family History  Family History  Problem Relation Age of Onset  . Diabetes Neg Hx    Social History  reports that he has never smoked. He has never used smokeless tobacco. He reports that he does not drink alcohol or use drugs. Allergies No Known Allergies Home medications Prior to Admission medications   Medication Sig Start Date End Date Taking? Authorizing Provider  calcium acetate (PHOSLO) 667 MG capsule Take 1 capsule (667 mg total) by mouth 3 (three) times daily with meals. Patient taking differently: Take 1,334-2,668 mg by mouth See admin instructions. Take 4 capsules (2668 mg) by mouth with meals and 2 capsules (1334 mg) with snacks 03/07/15   [name], MD  insulin glargine (LANTUS) 100 UNIT/ML injection Inject 0.1 mLs (10 Units total) into the skin at bedtime. 02/15/18   Desiree Hane, MD  isosorbide-hydrALAZINE (BIDIL) 20-37.5 MG tablet Take 1 tablet by mouth 3 (three) times daily. 03/13/18   Ward, Delice Bison, DO  levETIRAcetam (KEPPRA) 500 MG tablet Take 1 tablet (500 mg total) by mouth 2 (two) times daily. 03/13/18   Ward, Delice Bison, DO  losartan (COZAAR) 50 MG tablet Take 1 tablet (50 mg total) by mouth daily. 03/13/18   Ward, Delice Bison, DO  multivitamin (RENA-VIT) TABS tablet Take 1 tablet by mouth at bedtime. Patient taking differently: Take 1 tablet by mouth  daily.  03/07/15   [name], MD  NIFEdipine (ADALAT CC) 30 MG 24 hr tablet Take 1 tablet (30 mg total) by mouth 2 (two) times daily. 03/13/18   Ward, Delice Bison, DO  spironolactone (ALDACTONE) 50 MG tablet Take 1 tablet (50 mg total) by mouth daily. 03/13/18   Ward, Delice Bison, DO   Liver Function Tests Recent Labs  Lab 03/12/18 2200 03/14/18 0827  AST 37 41  ALT 18 22  ALKPHOS 66 66  BILITOT 0.6 0.9  PROT 7.9 7.7  ALBUMIN 4.0 3.6   No results for input(s): LIPASE, AMYLASE in the last 168 hours. CBC Recent Labs  Lab 03/12/18 2200 03/14/18 0827 03/14/18 1100  WBC 8.6 4.5  --    NEUTROABS 7.1 3.3  --   HGB 11.9* 12.8* 12.2*  HCT 38.2* 41.6 36.0*  MCV 94.3 95.0  --   PLT 218 313  --    Basic Metabolic Panel Recent Labs  Lab 03/12/18 2200 03/14/18 0827 03/14/18 1100  NA 139 140 138  K 4.7 4.5 4.3  CL 106 100 99  CO2 18* 26  --   GLUCOSE 200* 155* 345*  BUN 34* 28* 30*  CREATININE 13.38* 10.85* 10.90*  CALCIUM 8.6* 8.2*  --    Iron/TIBC/Ferritin/ %Sat    Component Value Date/Time   IRON 23 (L) 03/04/2015 0411   TIBC 192 (L) 03/04/2015 0411   FERRITIN 117 03/04/2015 0411   IRONPCTSAT 12 (L) 03/04/2015 0411    Vitals:   03/14/18 1150 03/14/18 1200 03/14/18 1215 03/14/18 1230  BP:  119/70 122/77 114/74  Pulse:  76 77 77  Resp:  18 18 18   Temp: (S) 99 F (37.2 C) 99.3 F (37.4 C) 99.3 F (37.4 C) 99.7 F (37.6 C)  TempSrc:      SpO2:  100% 100% 100%  Weight:      Height:       Exam Gen on vent, sedated on propofol No rash, cyanosis or gangrene Sclera anicteric, throat w ETT, no secretions in tube  No jvd or bruits Chest clear bilat throughout no rales or wheezing RRR no MRG Abd soft ntnd no mass or ascites +bs GU normal male MS no joint effusions or deformity Ext no LE or UE edema, no wounds or ulcers Neuro is sedated on the vent LUA AVF+bruit    Home meds:  - isosorbide-hydralazine 20-37.5 bid/ losartan 50 qd/ nifedipine 30 bid/ spironolactone 25  - levetiracetam 500 bid  - insulin glargine 10 u qhs  - calc acetate 4 caps w meals   Dialysis: TTS SW  4h 38min  500/800  88.5kg  2/2.25   LUA AVF   Hep 5000  Sensipar 90  venofer 50/wk  Mircera 50 every 2 wks, last 12/28   Impression: 1. Hypoglycemia- pt found down 2. VDRF 3. ESRD on HD TTS. Has not missed any HD 4. HTN - stable 5. Vol - up 2kg 6. H/o seizure do 7. Anemia ckd 8. MBD ckd   Plan 1. HD tomorrow on schedule, UF to dry wt     Kelly Splinter MD [facility] pager [phone]   03/14/2018, 2:08 PM

## 2018-03-14 NOTE — ED Provider Notes (Signed)
[facility] EMERGENCY DEPARTMENT Provider Note   CSN: [id number] Arrival date & time: 03/14/18  0754     History   Chief Complaint No chief complaint on file.   HPI Edward Colon is a 38 y.o. male.  The history is provided by a parent and the EMS personnel. No language interpreter was used.  Altered Mental Status  Presenting symptoms: combativeness and unresponsiveness   Severity:  Severe Most recent episode:  Today Timing:  Constant Pt was last normal at 9pm  Last night.  Pt went out for dinner with family.  Pt has a history of diabetes and renal failure.  Pt is on Monday, Wednesday, Friday dialysis.  Last dialysis date unknown.  EMs reports Father found pt unresponsive this am.  Pt has glucose in 40's.  He has been combative but not responsive.  Past Medical History:  Diagnosis Date  . Anemia   . Blind left eye since ~ 2010  . Depression   . Diabetic peripheral neuropathy ([place])   . ESRD (end stage renal disease) on dialysis [facility])    "TTS; [facility]; [facility]" (02/01/2016)  . Heart murmur    denies any problems with it  . Hypertension   . Seizures ([place])    "last one was end of 2016; they are related to my diabetes" (02/01/2016)  . Type 1 diabetes ([place]) dx'd 1990    Patient Active Problem List   Diagnosis Date Noted  . Hypertensive emergency 02/15/2018  . Encephalopathy, hypertensive 02/15/2018  . Redness of left eye 02/15/2018  . Altered mental status 02/08/2018  . Cardiac arrest ([place])   . DKA (diabetic ketoacidoses) ([place]) 03/05/2017  . Ventricular tachycardia ([place]) 03/05/2017  . Acute respiratory failure ([place])   . Involuntary commitment 01/29/2016  . Suicide attempt by substance overdose ([place]) 01/28/2016  . ESRD (end stage renal disease) ([place]) 03/07/2015  . Hyperglycemia 03/06/2015  . Acute on chronic renal failure ([place]) 02/27/2015  . URI (upper respiratory infection) 02/27/2015  . Acute-on-chronic kidney injury ([place]) 02/27/2015  .  Rhabdomyolysis 08/31/2014  . Hypoglycemia 08/30/2014  . Type 1 diabetes ([place]) 08/30/2014  . CKD stage 2 due to type 2 diabetes mellitus ([place]) 08/30/2014  . Anemia 08/30/2014  . Swelling of arm 05/31/2013  . ARF (acute renal failure) ([place]) 05/31/2013  . Hyperkalemia 05/31/2013  . Seizure ([place]) 05/31/2013  . Seizures ([place])   . Hypertension     Past Surgical History:  Procedure Laterality Date  . AV FISTULA PLACEMENT Right 03/03/2015   Procedure: RIGHT RADIO-CEPHALIC ARTERIOVENOUS (AV) FISTULA CREATION;  Surgeon: Serafina Mitchell, MD;  Location: [facility] OR;  Service: Vascular;  Laterality: Right;  . AV FISTULA PLACEMENT Left 06/15/2015   Procedure: INSERTION OF LEFT UPPER ARM  ARTERIOVENOUS (AV) 30mm x 50cm GORE-TEX GRAFT;  Surgeon: Elam Dutch, MD;  Location: [facility];  Service: Vascular;  Laterality: Left;  . BASCILIC VEIN TRANSPOSITION Left 04/01/2015   Procedure: BASCILIC VEIN TRANSPOSITION-LEFT ARM- FIRST STAGE;  Surgeon: Elam Dutch, MD;  Location: [facility];  Service: Vascular;  Laterality: Left;  . EYE SURGERY Right ~ 2010   for diabetic retinopathy  . INSERTION OF DIALYSIS CATHETER Right 03/03/2015   Procedure: INSERTION OF DIALYSIS CATHETER;  Surgeon: Serafina Mitchell, MD;  Location: [facility] OR;  Service: Vascular;  Laterality: Right;        Home Medications    Prior to Admission medications   Medication Sig Start Date End Date Taking? Authorizing Provider  calcium  acetate (PHOSLO) 667 MG capsule Take 1 capsule (667 mg total) by mouth 3 (three) times daily with meals. Patient taking differently: Take 1,334-2,668 mg by mouth See admin instructions. Take 4 capsules (2668 mg) by mouth with meals and 2 capsules (1334 mg) with snacks 03/07/15   [name], MD  insulin glargine (LANTUS) 100 UNIT/ML injection Inject 0.1 mLs (10 Units total) into the skin at bedtime. 02/15/18   Desiree Hane, MD  isosorbide-hydrALAZINE (BIDIL) 20-37.5 MG tablet Take 1 tablet by mouth 3 (three) times  daily. 03/13/18   Ward, Delice Bison, DO  levETIRAcetam (KEPPRA) 500 MG tablet Take 1 tablet (500 mg total) by mouth 2 (two) times daily. 03/13/18   Ward, Delice Bison, DO  losartan (COZAAR) 50 MG tablet Take 1 tablet (50 mg total) by mouth daily. 03/13/18   Ward, Delice Bison, DO  multivitamin (RENA-VIT) TABS tablet Take 1 tablet by mouth at bedtime. Patient taking differently: Take 1 tablet by mouth daily.  03/07/15   [name], MD  NIFEdipine (ADALAT CC) 30 MG 24 hr tablet Take 1 tablet (30 mg total) by mouth 2 (two) times daily. 03/13/18   Ward, Delice Bison, DO  spironolactone (ALDACTONE) 50 MG tablet Take 1 tablet (50 mg total) by mouth daily. 03/13/18   Ward, Delice Bison, DO    Family History Family History  Problem Relation Age of Onset  . Diabetes Neg Hx     Social History Social History   Tobacco Use  . Smoking status: Never Smoker  . Smokeless tobacco: Never Used  Substance Use Topics  . Alcohol use: No    Frequency: Never    Comment: 02/01/2016 "might have 1 drink/year"  . Drug use: No     Allergies   Patient has no known allergies.   Review of Systems Review of Systems  All other systems reviewed and are negative.    Physical Exam Updated Vital Signs BP (!) 159/93   Pulse 73   Temp (S) (!) 93.8 F (34.3 C) (Rectal)   Resp 15   SpO2 100%   Physical Exam Vitals signs reviewed.  HENT:     Head: Normocephalic.     Left Ear: External ear normal.     Nose: Nose normal.     Mouth/Throat:     Mouth: Mucous membranes are moist.  Eyes:     Pupils: Pupils are equal, round, and reactive to light.  Cardiovascular:     Rate and Rhythm: Normal rate.  Pulmonary:     Effort: Pulmonary effort is normal.  Abdominal:     General: Abdomen is flat.  Musculoskeletal: Normal range of motion.  Skin:    Comments: Cool to touch   Neurological:     General: No focal deficit present.     Comments: Moving all extremities,   Psychiatric:        Mood and Affect: Mood normal.      Comments: unresponsive      ED Treatments / Results  Labs (all labs ordered are listed, but only abnormal results are displayed) Labs Reviewed  CBC WITH DIFFERENTIAL/PLATELET - Abnormal; Notable for the following components:      Result Value   Hemoglobin 12.8 (*)    All other components within normal limits  CBG MONITORING, ED - Abnormal; Notable for the following components:   Glucose-Capillary 56 (*)    All other components within normal limits  I-STAT CG4 LACTIC ACID, ED - Abnormal; Notable for the following components:  Lactic Acid, Venous 3.90 (*)    All other components within normal limits  CBG MONITORING, ED - Abnormal; Notable for the following components:   Glucose-Capillary 19 (*)    All other components within normal limits  CBG MONITORING, ED - Abnormal; Notable for the following components:   Glucose-Capillary 113 (*)    All other components within normal limits  CULTURE, BLOOD (ROUTINE X 2)  CULTURE, BLOOD (ROUTINE X 2)  COMPREHENSIVE METABOLIC PANEL  URINALYSIS, ROUTINE W REFLEX MICROSCOPIC  PROCALCITONIN  PROTIME-INR  APTT  CBG MONITORING, ED    EKG EKG Interpretation  Date/Time:  Wednesday March 14 2018 08:26:26 EST Ventricular Rate:  72 PR Interval:    QRS Duration: 95 QT Interval:  467 QTC Calculation: 512 R Axis:   49 Text Interpretation:  Sinus rhythm Left ventricular hypertrophy ST elev, probable normal early repol pattern Prolonged QT interval no sig change from previous Confirmed by Charlesetta Shanks [phone]) on 03/14/2018 8:32:49 AM   Radiology Ct Head Wo Contrast  Result Date: 03/13/2018 CLINICAL DATA:  Syncope with unwitnessed seizure EXAM: CT HEAD WITHOUT CONTRAST TECHNIQUE: Contiguous axial images were obtained from the base of the skull through the vertex without intravenous contrast. COMPARISON:  02/08/2018 FINDINGS: Brain: No evidence of acute infarction, hemorrhage, hydrocephalus, extra-axial collection or mass lesion/mass effect.  Vascular: No hyperdense vessel or unexpected calcification. Skull: Normal. Negative for fracture or focal lesion. 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 Chronic retinal detachment is also possibility accounting for this appearance. Clear paranasal sinuses and mastoids. Other: None IMPRESSION: 1. No acute intracranial abnormality. 2. 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 Electronically Signed   By: Ashley Royalty M.D.   On: 03/13/2018 00:34   Dg Chest Port 1 View  Result Date: 03/14/2018 CLINICAL DATA:  Unresponsive EXAM: PORTABLE CHEST 1 VIEW COMPARISON:  March 12, 2018 FINDINGS: There is no appreciable edema or consolidation. There is stable cardiac prominence with pulmonary vascularity within normal limits. No adenopathy. No pneumothorax. No bone lesions. IMPRESSION: Stable cardiac prominence.  No edema or consolidation. Electronically Signed   By: Lowella Grip III M.D.   On: 03/14/2018 09:00   Dg Chest Port 1 View  Result Date: 03/12/2018 CLINICAL DATA:  Altered mental status. EXAM: PORTABLE CHEST 1 VIEW COMPARISON:  02/08/2018 FINDINGS: Low lung volumes. Stable cardiomegaly. No pulmonary edema, focal airspace disease, pneumothorax or large pleural effusion. No acute osseous abnormalities. IMPRESSION: Low lung volumes without acute abnormality. Stable cardiomegaly. Electronically Signed   By: Keith Rake M.D.   On: 03/12/2018 21:18    Procedures .Critical Care Performed by: Fransico Meadow, PA-C Authorized by: Fransico Meadow, PA-C   Critical care provider statement:    Critical care time (minutes):  45   Critical care start time:  03/14/2018 8:00 AM   Critical care time was exclusive of:  Separately billable procedures and  treating other patients   Critical care was time spent personally by me on the following activities:  Discussions with consultants, evaluation of patient's response to treatment, examination of patient, ordering and performing treatments and interventions, ordering and review of laboratory studies, ordering and review of radiographic studies, pulse oximetry, re-evaluation of patient's condition, obtaining history from patient or surrogate, review of old charts, blood draw for specimens and development of  treatment plan with patient or surrogate   (including critical care time)  Medications Ordered in ED Medications  dextrose 10 % infusion ( Intravenous New Bag/Given 03/14/18 0901)  dextrose 50 % solution (has no administration in time range)  etomidate (AMIDATE) injection 0.3 mg/kg (has no administration in time range)  rocuronium (ZEMURON) injection 1 mg/kg (has no administration in time range)  etomidate (AMIDATE) injection (20 mg Intravenous Given 03/14/18 0911)  rocuronium (ZEMURON) injection (100 mg Intravenous Given 03/14/18 0911)  propofol (DIPRIVAN) 1000 MG/100ML infusion (has no administration in time range)  LORazepam (ATIVAN) injection 1 mg (1 mg Intravenous Given 03/14/18 0837)  LORazepam (ATIVAN) injection 1 mg (1 mg Intravenous Given 03/14/18 0846)  dextrose 50 % solution 100 mL (100 mLs Intravenous Given 03/14/18 0900)  fentaNYL (SUBLIMAZE) injection 100 mcg (100 mcg Intravenous Given 03/14/18 0905)  midazolam (VERSED) injection 2 mg (2 mg Intravenous Given 03/14/18 0905)     Initial Impression / Assessment and Plan / ED Course  I have reviewed the triage vital signs and the nursing notes.  Pertinent labs & imaging results that were available during my care of the patient were reviewed by me and considered in my medical decision making (see chart for details).  Clinical Course as of Mar 14 1298  Wed Mar 14, 2018  0937 Consult: Dr. Malen Gauze neurology.  Discussed patient's seizure history.   Will evaluate.   [MP]  0940 Post intubation chest x-ray reviewed by myself.  ET tube in good position tip 2 cm above the carina at the level of the clavicles   [MP]  1121 Consult: Reviewed with Dr. Soyla Murphy nephrology.  Will see patient and evaluate for dialysis needs.  Reviewed history and hypoglycemia with history of severe hypoglycemia.   [MP]  1130 I-Stat CG4 Lactic Acid, ED [GS]    Clinical Course User Index [GS] Bernerd Pho, Student-PA [MP] Charlesetta Shanks, MD    MDM  Iv access obtained in left EJ by me.  Pt given ativan 1 mg.  Soft restraints applied.  Pt given D50 amp.  Dr. Vallery Ridge in to see and examine.  Pt remains combative.  Pt had episode of dropping 02 sats to 85 %, Pt's jaw clenched.  Intubation by Dr. Vallery Ridge Pt has required a total of 8 amps of D50. I spoke to critical care who will see for admission. Neurology will see for possible seizure Critical care advised nephrology consult.  Due to amount of D50 required.  Pt may have overdosed on insulin.  Possible dialysis.   Critical care here to see and admit.    Final Clinical Impressions(s) / ED Diagnoses   Final diagnoses:  Hypoglycemia  Hypothermia, initial encounter  Altered mental status, unspecified altered mental status type    ED Discharge Orders    None       Sidney Ace 03/14/18 1345    Charlesetta Shanks, MD 03/24/18 2005

## 2018-03-14 NOTE — ED Notes (Signed)
Dad at bedside, states pt had a dial treatment yesterday.

## 2018-03-14 NOTE — ED Notes (Signed)
Pt's CBG result was 155. Informed Cindy - RN.

## 2018-03-14 NOTE — Progress Notes (Addendum)
[facility] Progress Note Patient Name: Edward Colon DOB: 1980-04-27 MRN: [id number]   Date of Service  03/14/2018  HPI/Events of Note  Pt with type 1 Dm presenting with hypoglycemia, currently on D10.  CBG now >200.  eICU Interventions  Decrease D10 to 24ml/hr, check FS q30 mins x 2 then hourly.       Intervention Category Intermediate Interventions: Hyperglycemia - evaluation and treatment  Elsie Lincoln 03/14/2018, 9:02 PM    4:24 AM Informed by pharmacist that 1 bottle is growing coag negative Staph.  Second culture bottle (drawn 6 hours apart), still no growth.   Given that pt is a dialysis patient, will elect to give 1 dose of Cefazolin and consider discontinuing if the second bottle is negative, making this likely a contaminant.

## 2018-03-14 NOTE — ED Notes (Signed)
Critical Care paged to [place], RN-called by Levada Dy

## 2018-03-14 NOTE — Progress Notes (Signed)
Spoke w RN, Loree Fee - pt is still unable to come off of propofol for EEG - she has our lab number [phone]) and will contact us if pt becomes stable enough to do so

## 2018-03-14 NOTE — Consult Note (Addendum)
NEUROLOGY CONSULT  Reason for Consult: seizures Referring Physician: ER  CC: unable; intubated and sedated  HPI: Edward Colon is an 38 y.o. male  with noncompliance and poor disease mgt of his multiple comorbidities and PMH/current medical issues are complications of T1 DM, including blindness, ESRD on HD, HTN and seizures. Pt unable to provide history, thus pts father at bedside gives HPI the best he knows, stating he doesn't attend all appointments with his son and his son mostly manages on his own and is resistant to family help. Dad only knows of one previous seizure in in 2016 or 2017 when he was severely hypoglycemic, semiler to this circumstance today. He was started on Keppra at that time, but has not had regular neuro f/u, dad thinks its his PCP that gives him this Rx. Dad reports poor medication compliance.  Today EMS was summoned for unconsciousness. On scene his CBG was 50 and they witnessed a generalized seizure. He was given 5 Versed IM and transported to ER. He was unable to protect his airway and was intubated. To note, he had same issue on 1/7 with CBG of 37 but left AMA once stabilized.    Past Medical History Past Medical History:  Diagnosis Date  . Anemia   . Blind left eye since ~ 2010  . Depression   . Diabetic peripheral neuropathy ([place])   . ESRD (end stage renal disease) on dialysis [facility])    "TTS; [place] [facility]; [facility]" (02/01/2016)  . Heart murmur    denies any problems with it  . Hypertension   . Seizures ([place])    "last one was end of 2016; they are related to my diabetes" (02/01/2016)  . Type 1 diabetes ([place]) dx'd 1990    Past Surgical History Past Surgical History:  Procedure Laterality Date  . AV FISTULA PLACEMENT Right 03/03/2015   Procedure: RIGHT RADIO-CEPHALIC ARTERIOVENOUS (AV) FISTULA CREATION;  Surgeon: Serafina Mitchell, MD;  Location: [facility] OR;  Service: Vascular;  Laterality: Right;  . AV FISTULA PLACEMENT Left 06/15/2015   Procedure:  INSERTION OF LEFT UPPER ARM  ARTERIOVENOUS (AV) 57mm x 50cm GORE-TEX GRAFT;  Surgeon: Elam Dutch, MD;  Location: [place];  Service: Vascular;  Laterality: Left;  . BASCILIC VEIN TRANSPOSITION Left 04/01/2015   Procedure: BASCILIC VEIN TRANSPOSITION-LEFT ARM- FIRST STAGE;  Surgeon: Elam Dutch, MD;  Location: [place];  Service: Vascular;  Laterality: Left;  . EYE SURGERY Right ~ 2010   for diabetic retinopathy  . INSERTION OF DIALYSIS CATHETER Right 03/03/2015   Procedure: INSERTION OF DIALYSIS CATHETER;  Surgeon: Serafina Mitchell, MD;  Location: [facility] OR;  Service: Vascular;  Laterality: Right;    Family History Family History  Problem Relation Age of Onset  . Diabetes Neg Hx     Social History    reports that he has never smoked. He has never used smokeless tobacco. He reports that he does not drink alcohol or use drugs.  Allergies No Known Allergies  Home Medications (Not in a hospital admission)   Hospital Medications . heparin  5,000 Units Subcutaneous Q8H  . pantoprazole (PROTONIX) IV  40 mg Intravenous QHS     ROS: History obtained from pts dad per HPI. Unable to complete d/t pt unable to give any info.   Physical Examination:  Vitals:   03/14/18 1150 03/14/18 1200 03/14/18 1215 03/14/18 1230  BP:  119/70 122/77 114/74  Pulse:  76 77 77  Resp:  18 18 18   Temp: (  S) 99 F (37.2 C) 99.3 F (37.4 C) 99.3 F (37.4 C) 99.7 F (37.6 C)  TempSrc:      SpO2:  100% 100% 100%  Weight:      Height:        General - critically ill, intubated, appears stated age. Heart - Regular rate and rhythm - no murmer Lungs - Clear to auscultation Abdomen - Soft - non tender Extremities - Distal pulses intact - no edema Skin - Warm and dry  Neurologic Examination:  Mental Status:  Just sedated with propofol bolus prior to my exam. Pupils are 35mm, reactive to light on right only (blind at baseline in left eye), he is unable to respond to questions or stimuli at this time.  Limited exam d/t intubation and sedation. Per RN before propofol there were no focal deficits.    LABORATORY STUDIES:  Basic Metabolic Panel: Recent Labs  Lab 03/12/18 2200 03/14/18 0827 03/14/18 1100  NA 139 140 138  K 4.7 4.5 4.3  CL 106 100 99  CO2 18* 26  --   GLUCOSE 200* 155* 345*  BUN 34* 28* 30*  CREATININE 13.38* 10.85* 10.90*  CALCIUM 8.6* 8.2*  --   MG 2.4  --   --     Liver Function Tests: Recent Labs  Lab 03/12/18 2200 03/14/18 0827  AST 37 41  ALT 18 22  ALKPHOS 66 66  BILITOT 0.6 0.9  PROT 7.9 7.7  ALBUMIN 4.0 3.6   No results for input(s): LIPASE, AMYLASE in the last 168 hours. No results for input(s): AMMONIA in the last 168 hours.  CBC: Recent Labs  Lab 03/12/18 2200 03/14/18 0827 03/14/18 1100  WBC 8.6 4.5  --   NEUTROABS 7.1 3.3  --   HGB 11.9* 12.8* 12.2*  HCT 38.2* 41.6 36.0*  MCV 94.3 95.0  --   PLT 218 313  --     Cardiac Enzymes: Recent Labs  Lab 03/12/18 2200  TROPONINI <0.03    BNP: Invalid input(s): POCBNP  CBG: Recent Labs  Lab 03/14/18 1023 03/14/18 1048 03/14/18 1135 03/14/18 1155 03/14/18 1227  GLUCAP <10* 225* 27* 155* 84    Microbiology:   Coagulation Studies: No results for input(s): LABPROT, INR in the last 72 hours.  Urinalysis:  Recent Labs  Lab 03/14/18 1135  COLORURINE YELLOW  LABSPEC 1.014  PHURINE 8.0  GLUCOSEU >=500*  HGBUR NEGATIVE  BILIRUBINUR NEGATIVE  KETONESUR NEGATIVE  PROTEINUR >=300*  NITRITE NEGATIVE  LEUKOCYTESUR NEGATIVE    Lipid Panel:     Component Value Date/Time   TRIG 221 (H) 02/10/2018 0634    HgbA1C:  Lab Results  Component Value Date   HGBA1C 9.1 (H) 02/13/2018    Urine Drug Screen:      Component Value Date/Time   LABOPIA NONE DETECTED 04/29/2016 1214   COCAINSCRNUR NONE DETECTED 04/29/2016 1214   LABBENZ NONE DETECTED 04/29/2016 1214   AMPHETMU NONE DETECTED 04/29/2016 1214   THCU NONE DETECTED 04/29/2016 1214   LABBARB NONE DETECTED  04/29/2016 1214     Alcohol Level:  Recent Labs  Lab 03/12/18 2200  ETH <10    Miscellaneous labs:  EKG  EKG  IMAGING: Ct Head Wo Contrast  Result Date: 03/13/2018 CLINICAL DATA:  Syncope with unwitnessed seizure EXAM: CT HEAD WITHOUT CONTRAST TECHNIQUE: Contiguous axial images were obtained from the base of the skull through the vertex without intravenous contrast. COMPARISON:  02/08/2018 FINDINGS: Brain: No evidence of acute infarction, hemorrhage, hydrocephalus, extra-axial  collection or mass lesion/mass effect. Vascular: No hyperdense vessel or unexpected calcification. Skull: Normal. Negative for fracture or focal lesion. 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 Chronic retinal detachment is also possibility accounting for this appearance. Clear paranasal sinuses and mastoids. Other: None IMPRESSION: 1. No acute intracranial abnormality. 2. 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 Electronically Signed   By: Ashley Royalty M.D.   On: 03/13/2018 00:34   Dg Chest Portable 1 View  Result Date: 03/14/2018 CLINICAL DATA:  Intubation EXAM: PORTABLE CHEST 1 VIEW COMPARISON:  03/14/2018 FINDINGS: Endotracheal tube 6 cm above the carina. NG tube is in the stomach. Mild cardiomegaly and vascular congestion. No confluent opacities, effusions or edema. IMPRESSION: Support devices in expected position as above. Mild cardiomegaly, vascular congestion. Electronically Signed   By: Rolm Baptise M.D.   On: 03/14/2018 09:45   Dg Chest Port 1 View  Result Date: 03/14/2018 CLINICAL DATA:  Unresponsive EXAM: PORTABLE CHEST 1 VIEW COMPARISON:  March 12, 2018 FINDINGS: There is no appreciable edema or consolidation. There is stable cardiac prominence with  pulmonary vascularity within normal limits. No adenopathy. No pneumothorax. No bone lesions. IMPRESSION: Stable cardiac prominence.  No edema or consolidation. Electronically Signed   By: Lowella Grip III M.D.   On: 03/14/2018 09:00   Dg Chest Port 1 View  Result Date: 03/12/2018 CLINICAL DATA:  Altered mental status. EXAM: PORTABLE CHEST 1 VIEW COMPARISON:  02/08/2018 FINDINGS: Low lung volumes. Stable cardiomegaly. No pulmonary edema, focal airspace disease, pneumothorax or large pleural effusion. No acute osseous abnormalities. IMPRESSION: Low lung volumes without acute abnormality. Stable cardiomegaly. Electronically Signed   By: Keith Rake M.D.   On: 03/12/2018 21:18   Dg Abd Portable 1 View  Result Date: 03/14/2018 CLINICAL DATA:  Nasogastric tube placement. EXAM: PORTABLE ABDOMEN - 1 VIEW COMPARISON:  None. FINDINGS: The bowel gas pattern is normal. Distal tip of nasogastric tube is seen in proximal stomach. No radio-opaque calculi or other significant radiographic abnormality are seen. IMPRESSION: Distal tip of nasogastric tube seen in proximal stomach. No evidence of bowel obstruction or ileus. Electronically Signed   By: Marijo Conception, M.D.   On: 03/14/2018 09:46     Assessment/Plan: This is a 38yr old with noncompliance and poor disease mgt and non-compliance of his multiple comorbidity, primary all complications of T1 DM, including blindness, ESRD on HD.   # Seizures- in setting of extreme hypoglycemia. Pts dad cannot give any seizure events outside of setting of severe hypoglycemia. Unsure if there is underlying seizure disorder or if previous events have all been provoked. Pt is non-compliant with keppra.  # ESRD on HD- per renal team # Severe hypoglycemia- 33, 36 yesterday with also seizure activity reported, but he left AMA # T1 DM with complications of blindness and ESRD- complicated case with noncompliance.  # HTN- normotensive goals  RECS: Stat EEG Continue  keppra Supportive care and normalization of glucose and electrolytes and temp Seiure precautions Currently bolused with propofol and unable to complete neuro exam Case d/w dad and RN at bedside. Case d/w Dr Lorraine Lax  [place] Cerise Metzger-Cihelka, ARNP-C, ANVP-BC  Attending neurologist's note to follow     NEUROHOSPITALIST ADDENDUM Performed a face to face diagnostic evaluation.   I have reviewed the contents of history and  physical exam as documented by PA/ARNP/Resident and agree with above documentation.  I have discussed and formulated the above plan as documented. Edits to the note have been made as needed.  38 year old male insulin-dependent diabetes mellitus who presented with altered mental status and seizure-like activity in the setting of extremely low glucose of 18.  She was intubated for airway protection. My assessment patient was intubated and on sedation with propofol-withdraws in all 4 extremities.  According to the nurse while weaning propofol patient becomes extremely agitated and while he moves all 4 extremities and therefore placed back on propofol.  Low suspicion patient is status epilepticus and therefore will perform EEG tomorrow when attempting to wean sedation.Patient's head CT is unremarkable.  Of note patient was started on Keppra during previous admission for seizures, according to father this is also provoked due to hypoglycemia.  Continue Keppra for now.     Karena Addison Lawrence Mitch MD [facility] [phone]   If 7pm to 7am, please call on call as listed on AMION.

## 2018-03-14 NOTE — Progress Notes (Signed)
Pt transported from ER26 to 3M06 on ventilator with RN. Pt stable throughout and vital signs remain stable.

## 2018-03-14 NOTE — ED Notes (Signed)
Bair Hugger removed from pt Core temp (37.2C)

## 2018-03-14 NOTE — ED Notes (Signed)
ED Provider at bedside. 

## 2018-03-14 NOTE — ED Notes (Signed)
Provider and RN notified of low glucose

## 2018-03-14 NOTE — Progress Notes (Signed)
Pt intubated and calm. Restraints removed from pt bilateral ankles and bilateral wrists.

## 2018-03-14 NOTE — ED Notes (Signed)
Pt's CBG result is 225. Informed Santiago Glad - RN and Jenny Reichmann - RN.

## 2018-03-14 NOTE — H&P (Signed)
NAME:  Edward Colon, MRN:  [id number], DOB:  1980/10/10, LOS: 0 ADMISSION DATE:  03/14/2018, CONSULTATION DATE:  03/14/18 REFERRING MD:  Johnney Killian, CHIEF COMPLAINT:  Hypoglycemia, altered mental status  Brief History   This is a 38 year old male with a history of seizure episodes(related to hypoglycemia, had been prescribed keppra), hypertension, and end-stage renal disease on dialysis TTS, and type 1 diabetes mellitus who was found down in his apartment earlier this morning.   History of present illness   This is a 38 year old male with a history of seizure episodes(related to hypoglycemia, had been prescribed keppra), hypertension, and end-stage renal disease on dialysis TTS, and type 1 diabetes mellitus who was found down in his apartment earlier this morning.  His father is at bedside who provides most of the information.  Patient went out to eat last night and this morning he did not respond to the family group text. The father went to go and visit him and found him down on the floor in a "deep sleep".  Father contacted EMS who noted that his CBG was down to 33, and they witnessed a generalized seizure. He was given 1 g of glucagon by EMS prior to arrival.  He was treated for his hypoglycemia but his mental status did not improve with blood sugars greater than 80, he was also have given Ativan for agitation.  Patient was also noted to have a witnessed generalized seizure, he was given 5 of Versed IM.  Lactic acid elevated to 4.2, improved with treatment of hypoglycemia. While here patient was noted to be unresponsive and combative. He was also hypotensive, tachypneic and hypertensive. Patient was intubated for airway protection and admitted to the ICU.  Father reports that patient does have a endocrinologist however he is unsure who it is.  He states that the patient's diabetes had been well controlled while he was living with his parents however about 10 years ago when he moved out he has been  having difficulty controlling his blood sugars and has had multiple admissions for hypoglycemia.  Father reports that he had dialysis yesterday.   On chart review patient has had multiple admissions for hypoglycemic events.  Patient was actually in the ED yesterday for severe hypertension, an episode of a seizure and a CBG of 37. He was treated in the EMS and his CBGs improved to around the 200s. He was also noted to have elevated blood pressures up yo 200's/100s.he was given hydralazine and Keppra and had some improvement however his blood pressures were still elevated. He became increasingly agitated and did not want to stay in the ED.    Past Medical History  Anemia, depression, diabetes type 1, diabetic peripheral neuropathy, end-stage renal disease dialysis TTS, hypertension  Significant Hospital Events   Intubated 03/14/18  Consults:  Neurology  Procedures:  None  Significant Diagnostic Tests:  ABG: pH 7.4, PCO2 50.8, PO2 348   Micro Data:  Blood cultures >>>  Antimicrobials:  None    Objective   Blood pressure 137/85, pulse 78, temperature (S) (!) 93.8 F (34.3 C), temperature source (S) Rectal, resp. rate 18, height 5\' 9"  (1.753 m), weight 90.4 kg, SpO2 100 %.    Vent Mode: PRVC FiO2 (%):  [50 %-100 %] 50 % Set Rate:  [18 bmp] 18 bmp Vt Set:  [510 mL-560 mL] 560 mL PEEP:  [5 cmH20] 5 cmH20 Plateau Pressure:  [15 cmH20-18 cmH20] 18 cmH20  No intake or output data in the 24 hours  ending 03/14/18 1105 Filed Weights   03/14/18 0915  Weight: 90.4 kg    Examination: General: Intubated, sedated, some movement in extremities HENT: Cephalic, atraumatic, pinpoint pupils Lungs: Lungs are clear to auscultation, intubated, normal work of breathing Cardiovascular: Regular rate and rhythm, no murmurs rubs or gallops Abdomen: Soft, positive bowel sounds, no guarding, no grimace with palpation Extremities: No LE edema, moves all extremities Neuro: Sedated, movement with  painful stimulation Skin: no obvious rashes or lesions  Resolved Hospital Problem list   None  Assessment & Plan:  This is a 38 year old male with a history of end-stage renal disease on dialysis Tuesday Thursday Saturday, T1DM, HTN, depression, and anemia who has had multiple admissions for hypoglycemia who presented after being found on the floor by his father. He was noted to be hypoglycemic and hypertensive. He had an episode of generalized seizure witnessed by EMS, no current evidence of seizure activity.   Intubated for airway protection AMS Seizure Severe hypoglycemia secondary to type 1 diabetes Patient has a history of seizures that have been related to hypoglycemia, he had been started on Keppra. Father does not know if patient has been taking this medication.  On exam he does not appear to be having any tonic-clonic seizures however patient is currently sedated so exam is limited.  Patient is currently on Lantus 10 units daily at home per the listed medications.  Is unclear how often he takes this.  Patient does have a stage renal disease and had missed hemodialysis, unclear when his last dialysis was but father reports that it was yesterday.  Possible that he has an increased elevation of Lantus in his system due to poor clearance through the kidneys.  May take a while for the lantus to be cleared and we will continue D10 drip.  Plan: -Continue propofol for sedation, wean as tolerated -Fentanyl 50 mg every 1 hours as needed for agitation -Need to improve his hypoglycemia prior to considering extubating, if his CBGs remained stable, can consider extubating later today -Obtain EEG after sedation weaned off per neurology -Continue D10 drip -q4CBGs -CT head  -Patient will likely need to be switched over to a shorter acting insulin given his current end-stage renal disease, can consider starting detemir once CBGs stabalize -Patient will need endocrinology consult to assist with home  medications given his difficulty controlling his T1DM -We will need a diabetic nutrition educator consult after extubation  SIRS 2/2 hypoglycemia:  -Patient was noted to be hypotensive to 93.8 and tachypneic to 22.  No leukocytosis.  Lactic acid elevated to 3.9 initially, improved to 1.2 with treatment of hypoglycemia.  Chest x-ray does not show any signs of consolidation or pnuemonia. Blood cultures have been drawn.  Slightly that the hypothermia is secondary to his hypoglycemia, we will continue to trend his temperature as his hypoglycemia treated.  -Follow-up blood cultures -Trend temperature curve and WBCs -Start vancomycin and cefepime per pharmacy  End-stage renal disease on hemodialysis TTS:  -CMP showed normal electrolyte levels. He apparently missed dialysis last Saturday, father reports that he went to dialysis yesterday.  -Continue to trend BMP -Nephrology consult for dialysis  Anemia: Hx of anemia, today it is 12.8 which is close to his base line. No active signs of bleeding.  -Daily CBCs  Hypertension:  At the moment he is normotensive.  He is on Bidil, losartan, spironolactone at home.  We will hold these medications for now and restart them as needed.  Best practice:  Diet: NPO Pain/Anxiety/Delirium  protocol (if indicated): Propofol VAP protocol (if indicated): Yes DVT prophylaxis: Heparin GI prophylaxis: Protonix Glucose control: D10 drip, frequent CBG Mobility: Bedboung Code Status: Full Family Communication: Discussed with father Disposition: Admit to ICU  Labs   CBC: Recent Labs  Lab 03/12/18 2200 03/14/18 0827 03/14/18 1100  WBC 8.6 4.5  --   NEUTROABS 7.1 3.3  --   HGB 11.9* 12.8* 12.2*  HCT 38.2* 41.6 36.0*  MCV 94.3 95.0  --   PLT 218 313  --     Basic Metabolic Panel: Recent Labs  Lab 03/12/18 2200 03/14/18 0827 03/14/18 1100  NA 139 140 138  K 4.7 4.5 4.3  CL 106 100 99  CO2 18* 26  --   GLUCOSE 200* 155* 345*  BUN 34* 28* 30*    CREATININE 13.38* 10.85* 10.90*  CALCIUM 8.6* 8.2*  --   MG 2.4  --   --    GFR: Estimated Creatinine Clearance: 10.5 mL/min (A) (by C-G formula based on SCr of 10.9 mg/dL (H)). Recent Labs  Lab 03/12/18 2200 03/14/18 0827 03/14/18 0833 03/14/18 0946 03/14/18 1052 03/14/18 1101  PROCALCITON  --  5.41  --   --   --   --   WBC 8.6 4.5  --   --   --   --   LATICACIDVEN  --   --  3.90* 4.0* 1.20 1.75    Liver Function Tests: Recent Labs  Lab 03/12/18 2200 03/14/18 0827  AST 37 41  ALT 18 22  ALKPHOS 66 66  BILITOT 0.6 0.9  PROT 7.9 7.7  ALBUMIN 4.0 3.6   No results for input(s): LIPASE, AMYLASE in the last 168 hours. No results for input(s): AMMONIA in the last 168 hours.  ABG    Component Value Date/Time   PHART 7.417 03/14/2018 1044   PCO2ART 50.8 (H) 03/14/2018 1044   PO2ART 348.0 (H) 03/14/2018 1044   HCO3 32.8 (H) 03/14/2018 1044   TCO2 28 03/14/2018 1100   ACIDBASEDEF 15.0 (H) 03/05/2017 1023   O2SAT 100.0 03/14/2018 1044     Coagulation Profile: No results for input(s): INR, PROTIME in the last 168 hours.  Cardiac Enzymes: Recent Labs  Lab 03/12/18 2200  TROPONINI <0.03    HbA1C: Hemoglobin A1C  Date/Time Value Ref Range Status  12/14/2016 10:37 AM 9.7  Final  04/11/2016 10:43 AM 9.7  Final   Hgb A1c MFr Bld  Date/Time Value Ref Range Status  02/13/2018 03:24 AM 9.1 (H) 4.8 - 5.6 % Final    Comment:    (NOTE) Pre diabetes:          5.7%-6.4% Diabetes:              >6.4% Glycemic control for   <7.0% adults with diabetes   08/31/2014 04:42 AM 10.3 (H) 4.8 - 5.6 % Final    Comment:    (NOTE)         Pre-diabetes: 5.7 - 6.4         Diabetes: >6.4         Glycemic control for adults with diabetes: <7.0     CBG: Recent Labs  Lab 03/14/18 0823 03/14/18 0851 03/14/18 0908 03/14/18 1023 03/14/18 1048  GLUCAP 75 19* 113* <10* 225*    Review of Systems:   Patient  Past Medical History  He,  has a past medical history of Anemia,  Blind left eye (since ~ 2010), Depression, Diabetic peripheral neuropathy ([place]), ESRD (end stage renal disease)  on dialysis [facility]), Heart murmur, Hypertension, Seizures ([place]), and Type 1 diabetes ([place]) (dx'd 1990).   Surgical History    Past Surgical History:  Procedure Laterality Date  . AV FISTULA PLACEMENT Right 03/03/2015   Procedure: RIGHT RADIO-CEPHALIC ARTERIOVENOUS (AV) FISTULA CREATION;  Surgeon: Serafina Mitchell, MD;  Location: [facility] OR;  Service: Vascular;  Laterality: Right;  . AV FISTULA PLACEMENT Left 06/15/2015   Procedure: INSERTION OF LEFT UPPER ARM  ARTERIOVENOUS (AV) 20mm x 50cm GORE-TEX GRAFT;  Surgeon: Elam Dutch, MD;  Location: [place];  Service: Vascular;  Laterality: Left;  . BASCILIC VEIN TRANSPOSITION Left 04/01/2015   Procedure: BASCILIC VEIN TRANSPOSITION-LEFT ARM- FIRST STAGE;  Surgeon: Elam Dutch, MD;  Location: [place];  Service: Vascular;  Laterality: Left;  . EYE SURGERY Right ~ 2010   for diabetic retinopathy  . INSERTION OF DIALYSIS CATHETER Right 03/03/2015   Procedure: INSERTION OF DIALYSIS CATHETER;  Surgeon: Serafina Mitchell, MD;  [facility] OR;  Service: Vascular;  Laterality: Right;     Social History   reports that he has never smoked. He has never used smokeless tobacco. He reports that he does not drink alcohol or use drugs.   Family History   His family history is negative for Diabetes.   Allergies No Known Allergies   Home Medications  Prior to Admission medications   Medication Sig Start Date End Date Taking? Authorizing Provider  calcium acetate (PHOSLO) 667 MG capsule Take 1 capsule (667 mg total) by mouth 3 (three) times daily with meals. Patient taking differently: Take 1,334-2,668 mg by mouth See admin instructions. Take 4 capsules (2668 mg) by mouth with meals and 2 capsules (1334 mg) with snacks 03/07/15   [name], MD  insulin glargine (LANTUS) 100 UNIT/ML injection Inject 0.1 mLs (10 Units total) into the skin at  bedtime. 02/15/18   Desiree Hane, MD  isosorbide-hydrALAZINE (BIDIL) 20-37.5 MG tablet Take 1 tablet by mouth 3 (three) times daily. 03/13/18   Ward, Delice Bison, DO  levETIRAcetam (KEPPRA) 500 MG tablet Take 1 tablet (500 mg total) by mouth 2 (two) times daily. 03/13/18   Ward, Delice Bison, DO  losartan (COZAAR) 50 MG tablet Take 1 tablet (50 mg total) by mouth daily. 03/13/18   Ward, Delice Bison, DO  multivitamin (RENA-VIT) TABS tablet Take 1 tablet by mouth at bedtime. Patient taking differently: Take 1 tablet by mouth daily.  03/07/15   [name], MD  NIFEdipine (ADALAT CC) 30 MG 24 hr tablet Take 1 tablet (30 mg total) by mouth 2 (two) times daily. 03/13/18   Ward, Delice Bison, DO  spironolactone (ALDACTONE) 50 MG tablet Take 1 tablet (50 mg total) by mouth daily. 03/13/18   Ward, Delice Bison, DO     Critical care time: 57    Asencion Noble, M.D. PGY1 Pager [phone] 03/14/2018 1:15 PM

## 2018-03-14 NOTE — ED Notes (Addendum)
Pt placed on Bair Hugger Administering warm IV fluids (NS)

## 2018-03-14 NOTE — Progress Notes (Signed)
Hypoglycemic Event  CBG: 62 at 1330  Treatment: 12.5 gm D50 IV given  Symptoms: pt intubated/sedated. No symptoms assessed  Follow-up CBG: Time:1356 CBG Result:107  Possible Reasons for Event: hypoglycemia on admission, monitoring  Comments/MD notified: Guilloud, MD - will continue to monitor CBG closely    Edward Colon  Q Edward Colon

## 2018-03-14 NOTE — Progress Notes (Signed)
Unable to get EEG at this time. Pt is currently unstable to have sedation lifted right now for EEG. Notified Neuro. Hold EEG. Will check on Patient later when schedule permits

## 2018-03-14 NOTE — ED Notes (Addendum)
First set [facility] collected from [facility]- Left  Pt tolerated well

## 2018-03-14 NOTE — ED Notes (Signed)
Pt's CBG result was 56. Informed Santiago Glad - PA and Jenny Reichmann - RN.

## 2018-03-14 NOTE — ED Triage Notes (Addendum)
Pt BIB [facility], Pt found by family unresponsive, CBG initially with EMS 33, given 1gm of glucagon by EMS PTA.  Pt is a dialysis pt, Tues, Thurs, Sat.   LSN by dad at 2100 03/13/18

## 2018-03-14 NOTE — ED Provider Notes (Signed)
Medical screening examination/treatment/procedure(s) were conducted as a shared visit with non-physician practitioner(s) and myself.  I personally evaluated the patient during the encounter.  EKG Interpretation  Date/Time:  Wednesday March 14 2018 08:26:26 EST Ventricular Rate:  72 PR Interval:    QRS Duration: 95 QT Interval:  467 QTC Calculation: 512 R Axis:   49 Text Interpretation:  Sinus rhythm Left ventricular hypertrophy ST elev, probable normal early repol pattern Prolonged QT interval no sig change from previous Confirmed by Charlesetta Shanks [phone]) on 03/14/2018 8:32:49 AM Patient was seen on 1\6 and left the hospital in the very early a.m. hours 1\7.  Patient has severe comorbid illness.  Patient's father last saw the patient yesterday evening.  They dropped him off at a movie.  They estimate he would have gotten home around 11 PM.  This morning, and he had called him and he did not answer.  He reports is very unusual.  He went by his house and the door was locked.  He had to get a key from the neighbor.  He then found him unresponsive and on the floor.  He thought he was probably hypoglycemic and called EMS.  Patient is extremely confused and combative.  Normocephalic atraumatic.  Pupils are symmetric midrange.  Heart is regular no gross rub murmur gallop.  Lungs are clear.  Abdomen is soft and nondistended.  No evidence of acute trauma.  Patient is moving in pulling at restraints.  He is moaning.  He does not answer any questions.  He is not following commands.  Skin is warm and dry.  Patient was found to be severely hyperglycemic.  He is also severely hypertensive.  Multiple potential etiologies for mental status change.  Patient was given D50 but mental status did not improve with blood sugar greater than 80.  He then had a rebound of hypoglycemia and was given 2 A of D50.  This brought blood sugar up to 117 with again no significant change in mental status.  Patient has been getting  Ativan for agitation.  This is somewhat helpful but unclear if he is having some seizure activity.  He is not exhibiting clear tonic-clonic activity.  No repetitive facial motions.  Patient continued to be combative and required intubation for protection of airway and further diagnostic imaging.  Patient needs emergent CT scan of the head.  Will need neurology consultation for EEG monitoring.  Prior to intubation, patient's heart rate is 70 in sinus.  Oxygenation is 100% on nonrebreather mask.  Blood pressure has come down to 160s over 90s without administration of any antihypertensives.  This is with treatment for sedation.  Patient thus intubated with uncomplicated RSI.  We will proceed now with CT head and neurology consultation.  Procedure Name: Intubation Date/Time: 03/14/2018 9:23 AM Performed by: Charlesetta Shanks, MD Pre-anesthesia Checklist: Patient identified, Patient being monitored, Emergency Drugs available, Timeout performed and Suction available Oxygen Delivery Method: Non-rebreather mask Preoxygenation: Pre-oxygenation with 100% oxygen Induction Type: Rapid sequence Ventilation: Mask ventilation without difficulty Laryngoscope Size: Glidescope and 3 Grade View: Grade I Tube size: 7.5 mm Number of attempts: 1 Airway Equipment and Method: Video-laryngoscopy Placement Confirmation: ETT inserted through vocal cords under direct vision,  CO2 detector and Breath sounds checked- equal and bilateral Secured at: 24 cm Tube secured with: ETT holder Dental Injury: Teeth and Oropharynx as per pre-operative assessment  Comments: Uncomplicated RSI.  Patient given fentanyl, Versed and etomidate for sedation, rocuronium for intubation.  Charlesetta Shanks, MD 03/15/18 [phone]

## 2018-03-15 ENCOUNTER — Inpatient Hospital Stay (HOSPITAL_COMMUNITY): Payer: Medicare Other

## 2018-03-15 DIAGNOSIS — R451 Restlessness and agitation: Secondary | ICD-10-CM

## 2018-03-15 DIAGNOSIS — Z992 Dependence on renal dialysis: Secondary | ICD-10-CM

## 2018-03-15 DIAGNOSIS — N186 End stage renal disease: Secondary | ICD-10-CM

## 2018-03-15 DIAGNOSIS — E1140 Type 2 diabetes mellitus with diabetic neuropathy, unspecified: Secondary | ICD-10-CM

## 2018-03-15 DIAGNOSIS — R4182 Altered mental status, unspecified: Secondary | ICD-10-CM

## 2018-03-15 DIAGNOSIS — G9340 Encephalopathy, unspecified: Secondary | ICD-10-CM

## 2018-03-15 DIAGNOSIS — H5440 Blindness, one eye, unspecified eye: Secondary | ICD-10-CM

## 2018-03-15 DIAGNOSIS — E162 Hypoglycemia, unspecified: Secondary | ICD-10-CM

## 2018-03-15 LAB — BLOOD CULTURE ID PANEL (REFLEXED)

## 2018-03-15 LAB — CBC
HCT: 36.9 % — ABNORMAL LOW (ref 39.0–52.0)
Hemoglobin: 11.5 g/dL — ABNORMAL LOW (ref 13.0–17.0)
MCH: 28.3 pg (ref 26.0–34.0)
MCHC: 31.2 g/dL (ref 30.0–36.0)
MCV: 90.7 fL (ref 80.0–100.0)
Platelets: 229 10*3/uL (ref 150–400)
RBC: 4.07 MIL/uL — ABNORMAL LOW (ref 4.22–5.81)
RDW: 13.9 % (ref 11.5–15.5)
WBC: 6.3 10*3/uL (ref 4.0–10.5)
nRBC: 0 % (ref 0.0–0.2)

## 2018-03-15 LAB — HIV ANTIBODY (ROUTINE TESTING W REFLEX): HIV Screen 4th Generation wRfx: NONREACTIVE

## 2018-03-15 LAB — BASIC METABOLIC PANEL
Anion gap: 12 (ref 5–15)
BUN: 41 mg/dL — ABNORMAL HIGH (ref 6–20)
CO2: 23 mmol/L (ref 22–32)
Calcium: 8.3 mg/dL — ABNORMAL LOW (ref 8.9–10.3)
Chloride: 96 mmol/L — ABNORMAL LOW (ref 98–111)
Creatinine, Ser: 12.98 mg/dL — ABNORMAL HIGH (ref 0.61–1.24)
GFR calc Af Amer: 5 mL/min — ABNORMAL LOW (ref >60)
GFR calc non Af Amer: 4 mL/min — ABNORMAL LOW (ref >60)
Glucose, Bld: 333 mg/dL — ABNORMAL HIGH (ref 70–99)
Potassium: 5.5 mmol/L — ABNORMAL HIGH (ref 3.5–5.1)
SODIUM: 131 mmol/L — AB (ref 135–145)

## 2018-03-15 LAB — PHOSPHORUS
PHOSPHORUS: 4.2 mg/dL (ref 2.5–4.6)
Phosphorus: 1.8 mg/dL — ABNORMAL LOW (ref 2.5–4.6)

## 2018-03-15 LAB — GLUCOSE, CAPILLARY
GLUCOSE-CAPILLARY: 282 mg/dL — AB (ref 70–99)
GLUCOSE-CAPILLARY: 150 mg/dL — AB (ref 70–99)
Glucose-Capillary: 221 mg/dL — ABNORMAL HIGH (ref 70–99)
Glucose-Capillary: 285 mg/dL — ABNORMAL HIGH (ref 70–99)
Glucose-Capillary: 279 mg/dL — ABNORMAL HIGH (ref 70–99)
Glucose-Capillary: 249 mg/dL — ABNORMAL HIGH (ref 70–99)
Glucose-Capillary: 256 mg/dL — ABNORMAL HIGH (ref 70–99)
Glucose-Capillary: 182 mg/dL — ABNORMAL HIGH (ref 70–99)
Glucose-Capillary: 195 mg/dL — ABNORMAL HIGH (ref 70–99)
Glucose-Capillary: 209 mg/dL — ABNORMAL HIGH (ref 70–99)
Glucose-Capillary: 227 mg/dL — ABNORMAL HIGH (ref 70–99)

## 2018-03-15 LAB — MAGNESIUM
Magnesium: 2.1 mg/dL (ref 1.7–2.4)
Magnesium: 1.8 mg/dL (ref 1.7–2.4)

## 2018-03-15 MED ORDER — HEPARIN SODIUM (PORCINE) 1000 UNIT/ML IJ SOLN
INTRAMUSCULAR | Status: AC
  Filled 2018-03-15: qty 3

## 2018-03-15 MED ORDER — VITAL HIGH PROTEIN PO LIQD: 1000.0000 mL | ORAL | Status: DC

## 2018-03-15 MED ORDER — DEXMEDETOMIDINE HCL IN NACL 400 MCG/100ML IV SOLN
0.4000 ug/kg/h | INTRAVENOUS | Status: DC
  Administered 2018-03-15: 0.4 ug/kg/h via INTRAVENOUS
  Administered 2018-03-16: 0.8 ug/kg/h via INTRAVENOUS
  Filled 2018-03-15 (×2): qty 100

## 2018-03-15 MED ORDER — CEFAZOLIN SODIUM-DEXTROSE 2-4 GM/100ML-% IV SOLN
2.0000 g | Freq: Once | INTRAVENOUS | Status: AC
  Administered 2018-03-15: 2 g via INTRAVENOUS
  Filled 2018-03-15: qty 100

## 2018-03-15 MED ORDER — LEVETIRACETAM IN NACL 500 MG/100ML IV SOLN
500.0000 mg | Freq: Two times a day (BID) | INTRAVENOUS | Status: DC
  Administered 2018-03-15 – 2018-03-16 (×2): 500 mg via INTRAVENOUS
  Filled 2018-03-15 (×3): qty 100

## 2018-03-15 MED ORDER — INSULIN ASPART 100 UNIT/ML ~~LOC~~ SOLN
0.0000 [IU] | SUBCUTANEOUS | Status: DC
  Administered 2018-03-15 (×2): 3 [IU] via SUBCUTANEOUS
  Administered 2018-03-16: 7 [IU] via SUBCUTANEOUS
  Administered 2018-03-16: 3 [IU] via SUBCUTANEOUS
  Administered 2018-03-16 (×2): 2 [IU] via SUBCUTANEOUS
  Administered 2018-03-16: 7 [IU] via SUBCUTANEOUS
  Administered 2018-03-16 – 2018-03-17 (×3): 2 [IU] via SUBCUTANEOUS
  Administered 2018-03-17: 5 [IU] via SUBCUTANEOUS
  Administered 2018-03-17 (×2): 2 [IU] via SUBCUTANEOUS
  Administered 2018-03-18: 9 [IU] via SUBCUTANEOUS
  Administered 2018-03-18: 7 [IU] via SUBCUTANEOUS
  Administered 2018-03-18: 5 [IU] via SUBCUTANEOUS
  Administered 2018-03-19 (×2): 9 [IU] via SUBCUTANEOUS
  Administered 2018-03-19: 3 [IU] via SUBCUTANEOUS
  Administered 2018-03-19: 1 [IU] via SUBCUTANEOUS
  Administered 2018-03-19 – 2018-03-20 (×2): 7 [IU] via SUBCUTANEOUS
  Administered 2018-03-20: 1 [IU] via SUBCUTANEOUS

## 2018-03-15 MED ORDER — HEPARIN SODIUM (PORCINE) 1000 UNIT/ML DIALYSIS
2500.0000 [IU] | INTRAMUSCULAR | Status: DC | PRN
  Administered 2018-03-15: 2500 [IU] via INTRAVENOUS_CENTRAL

## 2018-03-15 MED ORDER — VITAL AF 1.2 CAL PO LIQD
1000.0000 mL | ORAL | Status: DC
  Administered 2018-03-15: 1000 mL

## 2018-03-15 NOTE — Progress Notes (Signed)
[facility] Progress Note  Subjective: on HD now, cont EEG as well  Vitals:   03/15/18 1115 03/15/18 1128 03/15/18 1130 03/15/18 1148  BP: (!) 164/89 (!) 164/89 (!) 160/98 (!) 144/84  Pulse: 80 79 83 97  Resp: '18 18 18 ' (!) 22  Temp:    98.5 F (36.9 C)  TempSrc:    Axillary  SpO2: 100% 100% 100% 100%  Weight:      Height:        Inpatient medications: . chlorhexidine gluconate (MEDLINE KIT)  15 mL Mouth Rinse BID  . Chlorhexidine Gluconate Cloth  6 each Topical Q0600  . heparin  5,000 Units Subcutaneous Q8H  . mouth rinse  15 mL Mouth Rinse 10 times per day  . pantoprazole (PROTONIX) IV  40 mg Intravenous QHS   . levETIRAcetam    . propofol (DIPRIVAN) infusion 10 mcg/kg/min (03/15/18 1100)   acetaminophen, fentaNYL (SUBLIMAZE) injection, [START ON 03/16/2018] heparin, ondansetron (ZOFRAN) IV  Iron/TIBC/Ferritin/ %Sat    Component Value Date/Time   IRON 23 (L) 03/04/2015 0411   TIBC 192 (L) 03/04/2015 0411   FERRITIN 117 03/04/2015 0411   IRONPCTSAT 12 (L) 03/04/2015 0411    Exam: Gen on vent sedated Sclera anicteric, throat w ETT No jvd or bruits Chest clear bilat RRR no MRG Abd soft ntnd no mass or ascites +bs GU normal male MS no joint effusions or deformity Ext no LE edema Neuro is sedated on the vent LUA AVF+bruit    Home meds:  - isosorbide-hydralazine 20-37.5 bid/ losartan 50 qd/ nifedipine 30 bid/ spironolactone 25  - levetiracetam 500 bid  - insulin glargine 10 u qhs  - calc acetate 4 caps w meals   Dialysis: TTS SW  4h 29mn  500/800  88.5kg  2/2.25   LUA AVF   Hep 5000  Sensipar 90  venofer 50/wk  Mircera 50 every 2 wks, last 12/28   Impression: 1. Hypoglycemia- pt found down 2. VDRF 3. ESRD on HD TTS. HD today on sched 4. HTN - stable 5. Vol - up 2kg pre hd today 6. H/o seizure do 7. Anemia ckd  P: 1. HD today   RKelly SplinterMD [facility] pager [phone]  03/15/2018, 12:19 PM   Recent  Labs  Lab 03/12/18 2200 03/14/18 0827 03/14/18 1100 03/14/18 1455 03/15/18 0809  NA 139 140 138  --  131*  K 4.7 4.5 4.3  --  5.5*  CL 106 100 99  --  96*  CO2 18* 26  --   --  23  GLUCOSE 200* 155* 345*  --  333*  BUN 34* 28* 30*  --  41*  CREATININE 13.38* 10.85* 10.90*  --  12.98*  CALCIUM 8.6* 8.2*  --   --  8.3*  PHOS  --   --   --   --  1.8*  ALBUMIN 4.0 3.6  --   --   --   INR  --   --   --  1.00  --    Recent Labs  Lab 03/12/18 2200 03/14/18 0827  AST 37 41  ALT 18 22  ALKPHOS 66 66  BILITOT 0.6 0.9  PROT 7.9 7.7   Recent Labs  Lab 03/12/18 2200 03/14/18 0827 03/14/18 1100 03/15/18 0809  WBC 8.6 4.5  --  6.3  NEUTROABS 7.1 3.3  --   --   HGB 11.9* 12.8* 12.2* 11.5*  HCT 38.2* 41.6 36.0* 36.9*  MCV 94.3  95.0  --  90.7  PLT 218 313  --  229

## 2018-03-15 NOTE — Progress Notes (Addendum)
eLink Physician-Brief Progress Note Patient Name: Edward Colon DOB: [birth year] MRN: [id number]   Date of Service  03/15/2018  HPI/Events of Note  Informed by RN that pt has been hypertensive with SBP in the 160s.  On visual assessment, patient is intubated, trying to get up from the bed.  Family is in the room.  Pt is not opening his eyes.  SBP were noted to be in the 150s.  eICU Interventions  Advised RN to give prn pain medication first before restarting antihypertensive.  Pt does take hydralazine at home and can be restarted if BP remains elevated.      Intervention Category Intermediate Interventions: Hypertension - evaluation and management  Elsie Lincoln 03/15/2018, 7:34 PM    8:31 PM Reviewed abdominal xray and the feeding tube is looped or curved in the stomach.  Placement is adequate. Ok to use as communicated with the RN.  Follow up of BP now at 116/65. Continue to observe off antihypertensives.   10:50 PM Feeding tube replaced as it was leaking earlier.  New feeding tube in good position. Ok to use.

## 2018-03-15 NOTE — Progress Notes (Signed)
38 year old IDDM, ESRD on dialysis with known history of hypoglycemia. On his prior admission Lantus was decreased from 20 units to 10 units. He was admitted with witnessed seizure, CBG 33 on EMS arrival, intubated in ED for airway protection  On exam-completed dialysis this morning, examined and again off propofol in the afternoon, nonpurposeful agitation, does not follow commands, pupils bilateral pinpoint, S1-S2 normal, no rub, clear breath sounds bilateral, no edema.  Chest x-ray 1/9 personally reviewed, ET tube in position, no infiltrates or effusions. Head CT negative.   Impression/plan Acute encephalopathy -postictal versus related to hypoglycemic injury EEG negative is reassuring, hoping that he will improve with time. We will use Precedex if increased agitation.  Acute respiratory failure-spontaneous breathing trial but hold off extubation until mental status improved.  Type 1 diabetes-allow sugars to run high, avoid Lantus and use SSI sensitive scale now that sugars high  Updated mom and dad at bedside.  My independent critical care time x 65m  Haylen Bellotti V. Elsworth Soho MD

## 2018-03-15 NOTE — Procedures (Signed)
ELECTROENCEPHALOGRAM REPORT   Patient: Edward Colon       Room #: [facility] EEG No. ID: [id number] Age: 38 y.o.        Sex: male Referring Physician: Agarwala Report Date:  03/15/2018        Interpreting Physician: Alexis Goodell  History: Edward Colon is an 37 y.o. male with hypoglycemia, found down.  Medications:  Protonix, Diprovan  Conditions of Recording:  This is a 21 channel routine scalp EEG performed with bipolar and monopolar montages arranged in accordance to the international 10/20 system of electrode placement. One channel was dedicated to EKG recording.  The patient is in the intubated and sedated state.  Description:  The background activity is slow and poorly organized.  It consists of a low voltage, mixture of delta and theta activity that is diffusely distributed and continuous.  Also noted intermittently are symmetrical sleep spindles and vertex central sharp transients. No epileptiform activity is noted.   Hyperventilation and intermittent photic stimulation were not performed.  IMPRESSION: This is a normal asleep electroencephalogram.  No epileptiform activity is noted.     Alexis Goodell, MD Neurology [phone] 03/15/2018, 11:09 AM

## 2018-03-15 NOTE — Progress Notes (Signed)
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 CBG 33 on EMS arrival with witnessed generalized seizure. Unknown down time.   Past Medical History  Anemia, depression, diabetes type 1, diabetic peripheral neuropathy, end-stage renal disease dialysis TTS, hypertension  Significant Hospital Events   1/8 >> Intubated, admit   Consults:  Neurology   Procedures:  1/8 ETT  Significant Diagnostic Tests:  1/8 CXR > negative 1/8 CT Head > No acute abnormality   Micro Data:  1/8 Blood cultures >> Coag negative staph   Antimicrobials:  1/8 Ancef x 1 dose   Interim history/subjective:  Sedated this morning on full vent support.   Objective   Blood pressure (!) 146/87, pulse 74, temperature 98.7 F (37.1 C), temperature source Axillary, resp. rate 18, height 5\' 9"  (1.753 m), weight 91.4 kg, SpO2 100 %.    Vent Mode: PRVC FiO2 (%):  [40 %-100 %] 40 % Set Rate:  [18 bmp] 18 bmp Vt Set:  [510 mL-560 mL] 560 mL PEEP:  [5 cmH20] 5 cmH20 Plateau Pressure:  [15 cmH20-18 cmH20] 18 cmH20   Intake/Output Summary (Last 24 hours) at 03/15/2018 0938 Last data filed at 03/15/2018 0600 Gross per 24 hour  Intake 1359.46 ml  Output 150 ml  Net 1209.46 ml   Filed Weights   03/14/18 0915 03/15/18 0500  Weight: 90.4 kg 91.4 kg    Examination: General: Sedated on vent HENT: ETT in place, thick secretions  Lungs: Bilateral rhonchi, no wheezing Cardiovascular: RRR, no m/r/g Abdomen: Soft, non distended Extremities: Warm, no edema Neuro: Sedated, unresponsive  GU: On dialysis   Resolved Hospital Problem list     Assessment & Plan:   Hypoglycemic  Seizure: Unknown down time. Multiple admissions for the same, takes keppra at home.  -- Neurology consult -- Continue Keppra; checking level -- Planning for EEG today during weaning trial  -- Holding lantus -- D10 infusion  -- q4h CBGs  Acute Respiratory Failure: Post ictal in ED and unable to protect airway. High risk for aspiration PNA with seizures and unknown down time.  -- Sputum cultures, CXR -- Full vent support -- Wean sedation as able -- Daily WUA and SBT -- VAP prevention   Type I DM: Takes Lantus 10 units at home. Profound hypoglycemia on admission.CBG 33 with EMS, improved with D50 but back down to 19 on arrival to ED. Treated with amps of D50 and D10 infusion. Now hyperglycemic this morning.  -- Start sensitive SSI q4h  -- CBG q4h  Encephalopathy: Likely post ictal. CT head without acute abnormality.   ESRD on HD (T/Th/Sat): no missed session per nephro. Electrolytes normal on admission.  -- Nephro consult -- Dialysis today   HTN: Holding home Bidil, losartan, and spironolactone   Best practice:   Diet: NPO Pain/Anxiety/Delirium protocol (if indicated): Propofol VAP protocol (if indicated): Yes DVT prophylaxis: Heparin GI prophylaxis: Protonix Glucose control: D10 drip, frequent CBG Mobility: Bedboung Code Status: Full Family Communication: Discussed with father Disposition: Admit to ICU  Labs   CBC: Recent Labs  Lab 03/12/18 2200 03/14/18 0827 03/14/18 1100  WBC 8.6 4.5  --   NEUTROABS 7.1  3.3  --   HGB 11.9* 12.8* 12.2*  HCT 38.2* 41.6 36.0*  MCV 94.3 95.0  --   PLT 218 313  --     Basic Metabolic Panel: Recent Labs  Lab 03/12/18 2200 03/14/18 0827 03/14/18 1100  NA 139 140 138  K 4.7 4.5 4.3  CL 106 100 99  CO2 18* 26  --   GLUCOSE 200* 155* 345*  BUN 34* 28* 30*  CREATININE 13.38* 10.85* 10.90*  CALCIUM 8.6* 8.2*  --   MG 2.4  --   --    GFR: Estimated Creatinine Clearance: 10.6 mL/min (A) (by C-G formula based on SCr of 10.9  mg/dL (H)). Recent Labs  Lab 03/12/18 2200 03/14/18 0827 03/14/18 0833 03/14/18 0946 03/14/18 1052 03/14/18 1101  PROCALCITON  --  5.41  --   --   --   --   WBC 8.6 4.5  --   --   --   --   LATICACIDVEN  --   --  3.90* 4.0* 1.20 1.75    Liver Function Tests: Recent Labs  Lab 03/12/18 2200 03/14/18 0827  AST 37 41  ALT 18 22  ALKPHOS 66 66  BILITOT 0.6 0.9  PROT 7.9 7.7  ALBUMIN 4.0 3.6   No results for input(s): LIPASE, AMYLASE in the last 168 hours. No results for input(s): AMMONIA in the last 168 hours.  ABG    Component Value Date/Time   PHART 7.417 03/14/2018 1044   PCO2ART 50.8 (H) 03/14/2018 1044   PO2ART 348.0 (H) 03/14/2018 1044   HCO3 32.8 (H) 03/14/2018 1044   TCO2 28 03/14/2018 1100   ACIDBASEDEF 15.0 (H) 03/05/2017 1023   O2SAT 100.0 03/14/2018 1044     Coagulation Profile: Recent Labs  Lab 03/14/18 1455  INR 1.00    Cardiac Enzymes: Recent Labs  Lab 03/12/18 2200  TROPONINI <0.03    HbA1C: Hemoglobin A1C  Date/Time Value Ref Range Status  12/14/2016 10:37 AM 9.7  Final  04/11/2016 10:43 AM 9.7  Final   Hgb A1c MFr Bld  Date/Time Value Ref Range Status  02/13/2018 03:24 AM 9.1 (H) 4.8 - 5.6 % Final    Comment:    (NOTE) Pre diabetes:          5.7%-6.4% Diabetes:              >6.4% Glycemic control for   <7.0% adults with diabetes   08/31/2014 04:42 AM 10.3 (H) 4.8 - 5.6 % Final    Comment:    (NOTE)         Pre-diabetes: 5.7 - 6.4         Diabetes: >6.4         Glycemic control for adults with diabetes: <7.0     CBG: Recent Labs  Lab 03/15/18 0103 03/15/18 0215 03/15/18 0337 03/15/18 0523 03/15/18 0659  GLUCAP 221* 285* 279* 249* 282*    Past Medical History  He,  has a past medical history of Anemia, Blind left eye (since ~ 2010), Depression, Diabetic peripheral neuropathy ([place]), ESRD (end stage renal disease) on dialysis ([place]), Heart murmur, Hypertension, Seizures ([place]), and Type 1 diabetes ([place]) (dx'd 1990).    Surgical History    Past Surgical History:  Procedure Laterality Date  . AV FISTULA PLACEMENT Right 03/03/2015   Procedure: RIGHT RADIO-CEPHALIC ARTERIOVENOUS (AV) FISTULA CREATION;  Surgeon: Serafina Mitchell, MD;  Location: [place];  Service: Vascular;  Laterality: Right;  . AV FISTULA PLACEMENT Left 06/15/2015  Procedure: INSERTION OF LEFT UPPER ARM  ARTERIOVENOUS (AV) 23mm x 50cm GORE-TEX GRAFT;  Surgeon: Elam Dutch, MD;  Location: [place];  Service: Vascular;  Laterality: Left;  . BASCILIC VEIN TRANSPOSITION Left 04/01/2015   Procedure: BASCILIC VEIN TRANSPOSITION-LEFT ARM- FIRST STAGE;  Surgeon: Elam Dutch, MD;  Location: [place];  Service: Vascular;  Laterality: Left;  . EYE SURGERY Right ~ 2010   for diabetic retinopathy  . INSERTION OF DIALYSIS CATHETER Right 03/03/2015   Procedure: INSERTION OF DIALYSIS CATHETER;  Surgeon: Serafina Mitchell, MD;  Location: [facility];  Service: Vascular;  Laterality: Right;     Social History   reports that he has never smoked. He has never used smokeless tobacco. He reports that he does not drink alcohol or use drugs.   Family History   His family history is negative for Diabetes.   Allergies No Known Allergies   Home Medications  Prior to Admission medications   Medication Sig Start Date End Date Taking? Authorizing Provider  minoxidil (LONITEN) 2.5 MG tablet Take 2.5 mg by mouth 2 (two) times daily.   Yes [provider]  calcium acetate (PHOSLO) 667 MG capsule Take 1 capsule (667 mg total) by mouth 3 (three) times daily with meals. Patient taking differently: Take 1,334-2,668 mg by mouth See admin instructions. Take 4 capsules (2668 mg) by mouth with meals and 2 capsules (1334 mg) with snacks 03/07/15   [name], MD  insulin glargine (LANTUS) 100 UNIT/ML injection Inject 0.1 mLs (10 Units total) into the skin at bedtime. 02/15/18   Desiree Hane, MD  isosorbide-hydrALAZINE (BIDIL) 20-37.5 MG tablet Take 1 tablet by  mouth 3 (three) times daily. 03/13/18   Ward, Delice Bison, DO  levETIRAcetam (KEPPRA) 500 MG tablet Take 1 tablet (500 mg total) by mouth 2 (two) times daily. 03/13/18   Ward, Delice Bison, DO  losartan (COZAAR) 50 MG tablet Take 1 tablet (50 mg total) by mouth daily. 03/13/18   Ward, Delice Bison, DO  multivitamin (RENA-VIT) TABS tablet Take 1 tablet by mouth at bedtime. Patient taking differently: Take 1 tablet by mouth daily.  03/07/15   [name], MD  NIFEdipine (ADALAT CC) 30 MG 24 hr tablet Take 1 tablet (30 mg total) by mouth 2 (two) times daily. 03/13/18   Ward, Delice Bison, DO  spironolactone (ALDACTONE) 50 MG tablet Take 1 tablet (50 mg total) by mouth daily. 03/13/18   Ward, Delice Bison, DO     Critical care time:      Velna Ochs, M.D. - PGY3 Pager: [phone] 03/15/2018, 7:48 AM

## 2018-03-15 NOTE — Progress Notes (Signed)
PHARMACY - PHYSICIAN COMMUNICATION CRITICAL VALUE ALERT - BLOOD CULTURE IDENTIFICATION (BCID)  Edward Colon is an 38 y.o. 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 James Ivanoff  Current antibiotics: None  Changes to prescribed antibiotics recommended:   Discussed with MD. Pt afebrile and without leukocytosis, but has ESRD on HD, and second set of blood cultures drawn ~ 6 hours after first set.  Ancef 2 g IV x 1 now, f/u blood cultures.   Results for orders placed or performed during the hospital encounter of 03/05/17  Blood Culture ID Panel (Reflexed) (Collected: 03/05/2017  8:28 AM)  Result Value Ref Range   Enterococcus species NOT DETECTED NOT DETECTED   Listeria monocytogenes NOT DETECTED NOT DETECTED   Staphylococcus species DETECTED (A) NOT DETECTED   Staphylococcus aureus (BCID) NOT DETECTED NOT DETECTED   Methicillin resistance NOT DETECTED NOT DETECTED   Streptococcus species NOT DETECTED NOT DETECTED   Streptococcus agalactiae NOT DETECTED NOT DETECTED   Streptococcus pneumoniae NOT DETECTED NOT DETECTED   Streptococcus pyogenes NOT DETECTED NOT DETECTED   Acinetobacter baumannii NOT DETECTED NOT DETECTED   Enterobacteriaceae species NOT DETECTED NOT DETECTED   Enterobacter cloacae complex NOT DETECTED NOT DETECTED   Escherichia coli NOT DETECTED NOT DETECTED   Klebsiella oxytoca NOT DETECTED NOT DETECTED   Klebsiella pneumoniae NOT DETECTED NOT DETECTED   Proteus species NOT DETECTED NOT DETECTED   Serratia marcescens NOT DETECTED NOT DETECTED   Haemophilus influenzae NOT DETECTED NOT DETECTED   Neisseria meningitidis NOT DETECTED NOT DETECTED   Pseudomonas aeruginosa NOT DETECTED NOT DETECTED   Candida albicans NOT DETECTED NOT DETECTED   Candida glabrata NOT DETECTED NOT DETECTED   Candida krusei NOT DETECTED NOT  DETECTED   Candida parapsilosis NOT DETECTED NOT DETECTED   Candida tropicalis NOT DETECTED NOT DETECTED    Caryl Pina 03/15/2018  4:17 AM

## 2018-03-15 NOTE — Progress Notes (Signed)
NEUROLOGY Progress Note  Reason for Consult: seizures  CC: unable; intubated and sedated  HPI: Edward Colon is an 38 y.o. male  with noncompliance and poor disease mgt of his multiple comorbidity. Due to this, he current medical issues are complications of T1 DM, including blindness, ESRD on HD, HTN and seizures. Dad only knows of one previous seizure in in 2016 or 2017 when he was severely hypoglycemic, semiler to this circumstance this admit. He was started on Keppra. Dad reports poor medication compliance.  To note, he had two days back to back ER visits for unconsciousness in setting of very low blood glucose and seizures. On 1/7 he left the ER AMA. On 1/8 his situation worsened and required intubation. On scene his CBG was 66 and EMS witnessed a generalized seizure.  Interval History: Unable to hold sedation without severe agitation, strong and purposeful movements toward tubes/lines req restraints. Currently getting CCRT at bedside. No family here currently. No further seizures noted.   Allergies No Known Allergies  Home Medications Medications Prior to Admission  Medication Sig Dispense Refill  . minoxidil (LONITEN) 2.5 MG tablet Take 2.5 mg by mouth 2 (two) times daily.    . calcium acetate (PHOSLO) 667 MG capsule Take 1 capsule (667 mg total) by mouth 3 (three) times daily with meals. (Patient taking differently: Take [phone] mg by mouth See admin instructions. Take 4 capsules (2668 mg) by mouth with meals and 2 capsules (1334 mg) with snacks) 90 capsule 0  . insulin glargine (LANTUS) 100 UNIT/ML injection Inject 0.1 mLs (10 Units total) into the skin at bedtime. 10 mL 11  . isosorbide-hydrALAZINE (BIDIL) 20-37.5 MG tablet Take 1 tablet by mouth 3 (three) times daily. 90 tablet 0  . levETIRAcetam (KEPPRA) 500 MG tablet Take 1 tablet (500 mg total) by mouth 2 (two) times daily. 60 tablet 2  . losartan (COZAAR) 50 MG tablet Take 1 tablet (50 mg total) by mouth daily. 60  tablet 0  . multivitamin (RENA-VIT) TABS tablet Take 1 tablet by mouth at bedtime. (Patient taking differently: Take 1 tablet by mouth daily. ) 30 tablet 0  . NIFEdipine (ADALAT CC) 30 MG 24 hr tablet Take 1 tablet (30 mg total) by mouth 2 (two) times daily. 60 tablet 0  . spironolactone (ALDACTONE) 50 MG tablet Take 1 tablet (50 mg total) by mouth daily. 30 tablet 0    Hospital Medications . chlorhexidine gluconate (MEDLINE KIT)  15 mL Mouth Rinse BID  . Chlorhexidine Gluconate Cloth  6 each Topical Q0600  . heparin  5,000 Units Subcutaneous Q8H  . mouth rinse  15 mL Mouth Rinse 10 times per day  . pantoprazole (PROTONIX) IV  40 mg Intravenous QHS     ROS: History obtained from pts dad per HPI. Unable to complete d/t pt unable to give any info.   Physical Examination:  Vitals:   03/15/18 1030 03/15/18 1045 03/15/18 1100 03/15/18 1115  BP: 138/80 (!) 175/93 (!) 145/88 (!) 164/89  Pulse: 80 83 80 80  Resp: _0 Temp:      TempSrc:      SpO2: 100% 100% 100% 100%  Weight:      Height:        General - critically ill, intubated, appears stated age. Heart - Regular rate and rhythm - no murmer Lungs - Clear to auscultation Abdomen - Soft - non tender Extremities - Distal pulses intact - no edema Skin - Warm and dry  Neurologic Examination:  Mental Status:  Unable to hold sedation d/t agitation and pulling at lines/ETT and currently on bedside CCRT, thus cannot take chance of dislodging lines at this time. He is very strong and purposeful with movements in all 4 ext when held per staff. Pupils are 64m, reactive to light on right only (blind at baseline in left eye).Even with sedation, he is able to open eyes to name/repeat touch. He is unable to respond to questions at this time. Limited exam d/t intubation and sedation.   LABORATORY STUDIES:  Basic Metabolic Panel: Recent Labs  Lab 03/12/18 2200 03/14/18 0827 03/14/18 1100 03/15/18 0809  NA 139 140 138 131*  K  4.7 4.5 4.3 5.5*  CL 106 100 99 96*  CO2 18* 26  --  23  GLUCOSE 200* 155* 345* 333*  BUN 34* 28* 30* 41*  CREATININE 13.38* 10.85* 10.90* 12.98*  CALCIUM 8.6* 8.2*  --  8.3*  MG 2.4  --   --  2.1  PHOS  --   --   --  1.8*    Liver Function Tests: Recent Labs  Lab 03/12/18 2200 03/14/18 0827  AST 37 41  ALT 18 22  ALKPHOS 66 66  BILITOT 0.6 0.9  PROT 7.9 7.7  ALBUMIN 4.0 3.6   No results for input(s): LIPASE, AMYLASE in the last 168 hours. No results for input(s): AMMONIA in the last 168 hours.  CBC: Recent Labs  Lab 03/12/18 2200 03/14/18 0827 03/14/18 1100 03/15/18 0809  WBC 8.6 4.5  --  6.3  NEUTROABS 7.1 3.3  --   --   HGB 11.9* 12.8* 12.2* 11.5*  HCT 38.2* 41.6 36.0* 36.9*  MCV 94.3 95.0  --  90.7  PLT 218 313  --  229    Cardiac Enzymes: Recent Labs  Lab 03/12/18 2200  TROPONINI <0.03    BNP: Invalid input(s): POCBNP  CBG: Recent Labs  Lab 03/15/18 0337 03/15/18 0523 03/15/18 0659 03/15/18 0823 03/15/18 1100  GLUCAP 279* 249* 282* 256* 150*    Microbiology:   Coagulation Studies: Recent Labs    03/14/18 1455  LABPROT 13.1  INR 1.00    Urinalysis:  Recent Labs  Lab 03/14/18 1135  COLORURINE YELLOW  LABSPEC 1.014  PHURINE 8.0  GLUCOSEU >=500*  HGBUR NEGATIVE  BILIRUBINUR NEGATIVE  KETONESUR NEGATIVE  PROTEINUR >=300*  NITRITE NEGATIVE  LEUKOCYTESUR NEGATIVE    Lipid Panel:     Component Value Date/Time   TRIG 221 (H) 02/10/2018 0634    HgbA1C:  Lab Results  Component Value Date   HGBA1C 9.1 (H) 02/13/2018    Urine Drug Screen:      Component Value Date/Time   LABOPIA NONE DETECTED 04/29/2016 1214   COCAINSCRNUR NONE DETECTED 04/29/2016 1214   LABBENZ NONE DETECTED 04/29/2016 1214   AMPHETMU NONE DETECTED 04/29/2016 1214   THCU NONE DETECTED 04/29/2016 1214   LABBARB NONE DETECTED 04/29/2016 1214     Alcohol Level:  Recent Labs  Lab 03/12/18 2200  ETH <10    Miscellaneous  labs:  EKG  EKG  IMAGING: Ct Head Wo Contrast  Result Date: 03/14/2018 CLINICAL DATA:  38y/o  M; found unresponsive this a.m. EXAM: CT HEAD WITHOUT CONTRAST TECHNIQUE: Contiguous axial images were obtained from the base of the skull through the vertex without intravenous contrast. COMPARISON:  03/12/2017 CT head. FINDINGS: Brain: No evidence of acute infarction, hemorrhage, hydrocephalus, extra-axial collection or mass lesion/mass effect. Vascular: No hyperdense vessel or unexpected calcification.  Skull: Normal. Negative for fracture or focal lesion. Sinuses/Orbits: Stable posterior left globe calcification and lentiform posterolateral collections compatible with chronic choroidal effusion or retinal detachment. Normal appearance of the right globe. Mucous retention cyst within the left maxillary sinus and mild sphenoid sinus mucosal thickening. Normal aeration of mastoid air cells. Other: None. IMPRESSION: No acute intracranial abnormality is identified. Stable CT of head. Electronically Signed   By: Kristine Garbe M.D.   On: 03/14/2018 16:49   Dg Chest Port 1 View  Result Date: 03/15/2018 CLINICAL DATA:  Intubation.  Respiratory failure. EXAM: PORTABLE CHEST 1 VIEW COMPARISON:  03/14/2018. FINDINGS: Endotracheal tube and NG tube in stable position. Cardiomegaly. Interim improvement of pulmonary venous congestion. Mild atelectasis right lung base. Tiny right pleural effusion. No pneumothorax. IMPRESSION: 1.  Lines and tubes in stable position. 2.  Mild atelectasis right lung base.  Tiny right pleural effusion. 3. Stable cardiomegaly. Interim improvement of pulmonary venous congestion. Electronically Signed   By: Marcello Moores  Register   On: 03/15/2018 06:02   Dg Chest Portable 1 View  Result Date: 03/14/2018 CLINICAL DATA:  Intubation EXAM: PORTABLE CHEST 1 VIEW COMPARISON:  03/14/2018 FINDINGS: Endotracheal tube 6 cm above the carina. NG tube is in the stomach. Mild cardiomegaly and vascular  congestion. No confluent opacities, effusions or edema. IMPRESSION: Support devices in expected position as above. Mild cardiomegaly, vascular congestion. Electronically Signed   By: Rolm Baptise M.D.   On: 03/14/2018 09:45   Dg Chest Port 1 View  Result Date: 03/14/2018 CLINICAL DATA:  Unresponsive EXAM: PORTABLE CHEST 1 VIEW COMPARISON:  March 12, 2018 FINDINGS: There is no appreciable edema or consolidation. There is stable cardiac prominence with pulmonary vascularity within normal limits. No adenopathy. No pneumothorax. No bone lesions. IMPRESSION: Stable cardiac prominence.  No edema or consolidation. Electronically Signed   By: Lowella Grip III M.D.   On: 03/14/2018 09:00   Dg Abd Portable 1 View  Result Date: 03/14/2018 CLINICAL DATA:  Nasogastric tube placement. EXAM: PORTABLE ABDOMEN - 1 VIEW COMPARISON:  None. FINDINGS: The bowel gas pattern is normal. Distal tip of nasogastric tube is seen in proximal stomach. No radio-opaque calculi or other significant radiographic abnormality are seen. IMPRESSION: Distal tip of nasogastric tube seen in proximal stomach. No evidence of bowel obstruction or ileus. Electronically Signed   By: Marijo Conception, M.D.   On: 03/14/2018 09:46     Assessment/Plan: This is a 38yrold with noncompliance and poor disease mgt and non-compliance of his multiple comorbidity, primary all complications of T1 DM, including blindness, ESRD on HD.   # Seizures- in setting of extreme hypoglycemia. Pts dad cannot give any account of seizure events outside of setting of severe hypoglycemia. Unsure if there is underlying seizure disorder or if previous events have all been provoked. Pt is non-compliant with keppra. Will continue ASD for now. EEG done on sedation is without status or epileptiform activity. This will need further out pt work up if possible to determine if there is underlying seizure disorder or if these are all provoked only. # ESRD on HD- per renal team #  Severe hypoglycemia- 33  # Acute hypoxic respiratory failure- intubated in ER. Continues on vent/sedation # T1 DM with complications of blindness and ESRD- complicated case with noncompliance.  # Agitation/encephalopathy- multiple issues, mostly metabolic issues d/t hypoxia, hypoglycemia, electrolytes, hypothermia etc.  # HTN- normotensive goals  RECS: Continue keppra Supportive care and normalization of glucose and electrolytes and temp Seiure precautions Currently  unable to hold propofol and unable to complete neuro exam Recommend out pt neuro f/u for underlying epilepsy will be needed after pt is stabilized here. However d/t his non-compliance and resistance to f/u appts, doubt this will be obtainable. Therefore recommend cont keppra for now since we do not know this info.  Counsel when able about medical compliance.  LCSW or mental health consult once able to better understand barriers to care We will follow with you  Eulene Pekar Metzger-Cihelka, ARNP-C, ANVP-BC [facility]   If 7pm to 7am, please call on call as listed on AMION.

## 2018-03-15 NOTE — Progress Notes (Addendum)
Initial Nutrition Assessment  DOCUMENTATION CODES:   Not applicable  INTERVENTION:   Tube Feeding Recommendations:   Initiate Vital AF 1.2 @ 70 mL. Regimen provides 2040 kcal, 127 grams of protein and 1378 mL of water. Meets 96% of calories needs and 101% of protein needs.   Recommend B complex vitamin with C daily.   Recommending supplementing phosphorus; noted Phos 1.8 pre HD today.   NUTRITION DIAGNOSIS:   Inadequate oral intake related to acute illness as evidenced by NPO status.  GOAL:   Provide needs based on ASPEN/SCCM guidelines  MONITOR:   TF tolerance, Labs, Diet advancement, Vent status, Weight trends  REASON FOR ASSESSMENT:   Ventilator    ASSESSMENT:   38 y.o male with PMH: Type 1 diabetes mellitus, ESRD on HD, diabetic peripheral neuropathy and history of hypoglycemic seizures. Admitted with hypoglycemic seizure and acute respiratory failure requiring intubatation and sedation.   Pt intubated, on ventilator support and sedated at time of visit. Off sedation pt is agitated and unable to follow commands.   Per the chart, pts dry weight is 88.5 kg and current weight is 88.3 kg. Admission height charts as 5'9". RD and DI measured pt at 6'0". Plan to reassess once pt extubated and able to communicate height.   Patient is currently intubated on ventilator support MV: 9.0 L/min Temp (24hrs), Avg:98.9 F (37.2 C), Min:98.3 F (36.8 C), Max:99.5 F (37.5 C)  Propofol: None  Enteral access OG Tube placed 1/8. Tip in the stomach. Abdomen soft. LBM today.  Medications reviewed and include: Protonix D10 IV  Propofol  Labs reviewed: Na 131 (L)  K 5.5 (H)  BUN 41 (H)  Creatinine 12.98 (H)  Phos 1.8 (L)  CBGs 256, 282, 249, 279, 285,221, 216, 223 X 12 hrs    NUTRITION - FOCUSED PHYSICAL EXAM:    Most Recent Value  Orbital Region  No depletion  Upper Arm Region  No depletion  Thoracic and Lumbar Region  No depletion  Buccal Region  Unable to assess   Temple Region  No depletion  Clavicle Bone Region  No depletion  Clavicle and Acromion Bone Region  No depletion  Scapular Bone Region  Unable to assess  Dorsal Hand  Unable to assess [mits]  Patellar Region  No depletion  Anterior Thigh Region  No depletion  Posterior Calf Region  No depletion  Edema (RD Assessment)  None  Hair  Reviewed  Eyes  Unable to assess  Mouth  Unable to assess  Skin  Reviewed  Nails  Reviewed       Diet Order:   Diet Order            Diet NPO time specified  Diet effective now              EDUCATION NEEDS:   Not appropriate for education at this time  Skin:  Skin Assessment: Reviewed RN Assessment  Last BM:  1/9- Type 6  Height:   Ht Readings from Last 1 Encounters:  03/15/18 6' (1.829 m)    Weight:   Wt Readings from Last 1 Encounters:  03/15/18 88.3 kg    Ideal Body Weight:  72.7 kg  BMI:  Body mass index is 26.4 kg/m.  Estimated Nutritional Needs:   Kcal:  2115 kcal/d (PSU 2003b)   Protein:  115-132 (1.3-1.5 g/kg/d)  Fluid:  1,000 mL +UOP    Mauricia Area, MS, Dietetic Intern Pager: [phone] After hours Pager: [phone]

## 2018-03-15 NOTE — Progress Notes (Signed)
Inpatient Diabetes Program Recommendations  AACE/ADA: New Consensus Statement on Inpatient Glycemic Control (2015)  Target Ranges:  Prepandial:   less than 140 mg/dL      Peak postprandial:   less than 180 mg/dL (1-2 hours)      Critically ill patients:  140 - 180 mg/dL   Lab Results  Component Value Date   GLUCAP 256 (H) 03/15/2018   HGBA1C 9.1 (H) 02/13/2018    Review of Glycemic Control Results for Edward Colon, Edward Colon (MRN [id number]) as of 03/15/2018 10:23  Ref. Range 03/15/2018 02:15 03/15/2018 03:37 03/15/2018 05:23 03/15/2018 06:59 03/15/2018 08:23  Glucose-Capillary Latest Ref Range: 70 - 99 mg/dL 285 (H) 279 (H) 249 (H) 282 (H) 256 (H)   Diabetes history: DM1 Outpatient Diabetes medications: Lantus 10 units qd Current orders for Inpatient glycemic control: None  Inpatient Diabetes Program Recommendations:   Since patient is type 1: -Lantus 6 units daily -Novolog Custom Correction Scale TID  CBG <70 - implement hypoglycemia protocol   70-120- 0 121-150 - 0 151-200 - 1 201-250 - 2 251-300 - 3 301-350 - 4 351-400 - 5 >400- call MD  Thank you, Nani Gasser. Iran Kievit, RN, MSN, CDE  Diabetes Coordinator Inpatient Glycemic Control Team Team Pager [phone] (8am-5pm) 03/15/2018 10:30 AM

## 2018-03-15 NOTE — Progress Notes (Signed)
EEG Completed; Results Pending  

## 2018-03-16 ENCOUNTER — Inpatient Hospital Stay (HOSPITAL_COMMUNITY): Payer: Medicare Other

## 2018-03-16 DIAGNOSIS — J9600 Acute respiratory failure, unspecified whether with hypoxia or hypercapnia: Secondary | ICD-10-CM

## 2018-03-16 LAB — GLUCOSE, CAPILLARY
GLUCOSE-CAPILLARY: 160 mg/dL — AB (ref 70–99)
GLUCOSE-CAPILLARY: 343 mg/dL — AB (ref 70–99)
Glucose-Capillary: 178 mg/dL — ABNORMAL HIGH (ref 70–99)
Glucose-Capillary: 187 mg/dL — ABNORMAL HIGH (ref 70–99)
Glucose-Capillary: 188 mg/dL — ABNORMAL HIGH (ref 70–99)
Glucose-Capillary: 190 mg/dL — ABNORMAL HIGH (ref 70–99)
Glucose-Capillary: 236 mg/dL — ABNORMAL HIGH (ref 70–99)
Glucose-Capillary: 246 mg/dL — ABNORMAL HIGH (ref 70–99)
Glucose-Capillary: 308 mg/dL — ABNORMAL HIGH (ref 70–99)

## 2018-03-16 LAB — PHOSPHORUS
Phosphorus: 5.1 mg/dL — ABNORMAL HIGH (ref 2.5–4.6)
Phosphorus: 5 mg/dL — ABNORMAL HIGH (ref 2.5–4.6)

## 2018-03-16 LAB — BASIC METABOLIC PANEL
Anion gap: 16 — ABNORMAL HIGH (ref 5–15)
BUN: 27 mg/dL — ABNORMAL HIGH (ref 6–20)
CO2: 24 mmol/L (ref 22–32)
CREATININE: 9.66 mg/dL — AB (ref 0.61–1.24)
Calcium: 8.1 mg/dL — ABNORMAL LOW (ref 8.9–10.3)
Chloride: 93 mmol/L — ABNORMAL LOW (ref 98–111)
GFR calc non Af Amer: 6 mL/min — ABNORMAL LOW (ref >60)
GFR, EST AFRICAN AMERICAN: 7 mL/min — AB (ref >60)
Glucose, Bld: 242 mg/dL — ABNORMAL HIGH (ref 70–99)
Potassium: 4 mmol/L (ref 3.5–5.1)
Sodium: 133 mmol/L — ABNORMAL LOW (ref 135–145)

## 2018-03-16 LAB — MAGNESIUM
Magnesium: 2.1 mg/dL (ref 1.7–2.4)
Magnesium: 2.1 mg/dL (ref 1.7–2.4)
Magnesium: 2.3 mg/dL (ref 1.7–2.4)

## 2018-03-16 LAB — CBC
HCT: 34 % — ABNORMAL LOW (ref 39.0–52.0)
Hemoglobin: 10.8 g/dL — ABNORMAL LOW (ref 13.0–17.0)
MCH: 28.3 pg (ref 26.0–34.0)
MCHC: 31.8 g/dL (ref 30.0–36.0)
MCV: 89 fL (ref 80.0–100.0)
Platelets: 219 10*3/uL (ref 150–400)
RBC: 3.82 MIL/uL — ABNORMAL LOW (ref 4.22–5.81)
RDW: 13.4 % (ref 11.5–15.5)
WBC: 7.1 10*3/uL (ref 4.0–10.5)
nRBC: 0 % (ref 0.0–0.2)

## 2018-03-16 LAB — LEVETIRACETAM LEVEL: LEVETIRACETAM: 3.4 ug/mL — AB (ref 10.0–40.0)

## 2018-03-16 MED ORDER — ISOSORB DINITRATE-HYDRALAZINE 20-37.5 MG PO TABS
1.0000 | ORAL_TABLET | Freq: Three times a day (TID) | ORAL | Status: DC
  Administered 2018-03-16 – 2018-03-18 (×4): 1
  Filled 2018-03-16 (×10): qty 1

## 2018-03-16 MED ORDER — INSULIN GLARGINE 100 UNIT/ML ~~LOC~~ SOLN
5.0000 [IU] | Freq: Every day | SUBCUTANEOUS | Status: DC
  Administered 2018-03-16 – 2018-03-18 (×3): 5 [IU] via SUBCUTANEOUS
  Filled 2018-03-16 (×5): qty 0.05

## 2018-03-16 MED ORDER — LEVETIRACETAM IN NACL 500 MG/100ML IV SOLN
500.0000 mg | INTRAVENOUS | Status: DC
  Administered 2018-03-17: 500 mg via INTRAVENOUS
  Filled 2018-03-16: qty 100

## 2018-03-16 MED ORDER — CHLORHEXIDINE GLUCONATE CLOTH 2 % EX PADS
6.0000 | MEDICATED_PAD | Freq: Every day | CUTANEOUS | Status: DC
  Administered 2018-03-17 – 2018-03-20 (×4): 6 via TOPICAL

## 2018-03-16 MED ORDER — HYDRALAZINE HCL 20 MG/ML IJ SOLN
5.0000 mg | Freq: Once | INTRAMUSCULAR | Status: AC
  Administered 2018-03-16: 5 mg via INTRAVENOUS
  Filled 2018-03-16: qty 1

## 2018-03-16 MED ORDER — SODIUM CHLORIDE 0.9 % IV SOLN
250.0000 mg | INTRAVENOUS | Status: DC
  Filled 2018-03-16: qty 2.5

## 2018-03-16 MED ORDER — LEVETIRACETAM IN NACL 500 MG/100ML IV SOLN: 500.0000 mg | INTRAVENOUS | Status: DC

## 2018-03-16 NOTE — Progress Notes (Signed)
[facility] Progress Note Patient Name: Edward Colon DOB: 06/11/1980 MRN: [id number]   Date of Service  03/16/2018  HPI/Events of Note  PT hypertensive and is not agitated.  SBP 190s.    eICU Interventions  Give hydralazine 5mg  IV now.  Restart home antihypertensives one at a time. Bidil ordered.     Intervention Category Intermediate Interventions: Hypertension - evaluation and management  Elsie Lincoln 03/16/2018, 4:43 AM

## 2018-03-16 NOTE — Progress Notes (Signed)
[facility] Progress Note  Subjective: on vent, BP meds restarted yest Bidil  Vitals:   03/16/18 0930 03/16/18 1000 03/16/18 1030 03/16/18 1100  BP: 121/69 131/77 130/74 (!) 157/89  Pulse: 74 73 71 73  Resp: _0 Temp:      TempSrc:      SpO2: 100% 100% 100% 100%  Weight:      Height:        Inpatient medications: . chlorhexidine gluconate (MEDLINE KIT)  15 mL Mouth Rinse BID  . Chlorhexidine Gluconate Cloth  6 each Topical Q0600  . heparin  5,000 Units Subcutaneous Q8H  . insulin aspart  0-9 Units Subcutaneous Q4H  . insulin glargine  5 Units Subcutaneous Daily  . isosorbide-hydrALAZINE  1 tablet Per Tube TID  . mouth rinse  15 mL Mouth Rinse 10 times per day  . pantoprazole (PROTONIX) IV  40 mg Intravenous QHS   . dexmedetomidine (PRECEDEX) IV infusion Stopped (03/16/18 3536)  . feeding supplement (VITAL AF 1.2 CAL) 70 mL/hr at 03/16/18 0600   acetaminophen, fentaNYL (SUBLIMAZE) injection, heparin, ondansetron (ZOFRAN) IV  Iron/TIBC/Ferritin/ %Sat    Component Value Date/Time   IRON 23 (L) 03/04/2015 0411   TIBC 192 (L) 03/04/2015 0411   FERRITIN 117 03/04/2015 0411   IRONPCTSAT 12 (L) 03/04/2015 0411    Exam: Gen on vent sedated Sclera anicteric, throat w ETT No jvd or bruits Chest clear bilat RRR no MRG Abd soft ntnd no mass or ascites +bs GU normal male MS no joint effusions or deformity Ext no LE edema Neuro is sedated on the vent LUA AVF+bruit    Home meds:  - isosorbide-hydralazine 20-37.5 bid/ losartan 50 qd/ nifedipine 30 bid/ spironolactone 25  - levetiracetam 500 bid  - insulin glargine 10 u qhs  - calc acetate 4 caps w meals   Dialysis: TTS SW  4h 50mn  500/800  88.5kg  2/2.25   LUA AVF   Hep 5000  Sensipar 90  venofer 50/wk  Mircera 50 every 2 wks, last 12/28   Impression: 1. Hypoglycemia- pt found down 2. Noncompliance w medications 3. VDRF 4. ESRD on HD TTS 5. HTN - stable 6. Vol - 2kg under no  ^vol on exam 7. H/o seizure do 8. Anemia ckd  P: 1. HD Sat in ICU, min UF   RKelly SplinterMD CPleasant Run Farmpager [phone]  03/16/2018, 11:31 AM   Recent Labs  Lab 03/12/18 2200 03/14/18 0827  03/14/18 1455  03/15/18 0809 03/15/18 1813 03/16/18 0551  NA 139 140   < >  --   --  131*  --  133*  K 4.7 4.5   < >  --   --  5.5*  --  4.0  CL 106 100   < >  --   --  96*  --  93*  CO2 18* 26  --   --   --  23  --  24  GLUCOSE 200* 155*   < >  --   --  333*  --  242*  BUN 34* 28*   < >  --   --  41*  --  27*  CREATININE 13.38* 10.85*   < >  --   --  12.98*  --  9.66*  CALCIUM 8.6* 8.2*  --   --   --  8.3*  --  8.1*  PHOS  --   --   --   --    < >  1.8* 4.2 5.1*  ALBUMIN 4.0 3.6  --   --   --   --   --   --   INR  --   --   --  1.00  --   --   --   --    < > = values in this interval not displayed.   Recent Labs  Lab 03/12/18 2200 03/14/18 0827  AST 37 41  ALT 18 22  ALKPHOS 66 66  BILITOT 0.6 0.9  PROT 7.9 7.7   Recent Labs  Lab 03/12/18 2200 03/14/18 0827  03/15/18 0809 03/16/18 0551  WBC 8.6 4.5  --  6.3 7.1  NEUTROABS 7.1 3.3  --   --   --   HGB 11.9* 12.8*   < > 11.5* 10.8*  HCT 38.2* 41.6   < > 36.9* 34.0*  MCV 94.3 95.0  --  90.7 89.0  PLT 218 313  --  229 219   < > = values in this interval not displayed.

## 2018-03-16 NOTE — Progress Notes (Signed)
Pharmacy is unable to confirm the medications the patient was taking at home. All options have been exhausted and a resolution to the situation is not expected.   We have evidence that all of the prescription meds on the list have been prescribed to him but it is unknown if he has them or had been taking them prior to this admission.   Please contact pharmacy if further assistance is needed.   Romeo Rabon, PharmD. Mobile: [phone]. 03/16/2018,8:55 AM.

## 2018-03-16 NOTE — Progress Notes (Signed)
38 year old IDDM, ESRD on dialysis with known history of hypoglycemia. On his prior admission Lantus was decreased from 20 units to 10 units. He was admitted with witnessed seizure, CBG 33 on EMS arrival, intubated in ED for airway protection  He was dialyzed 1/9 Examined during morning rounds on low-dose Precedex, still not following commands although moving all 4 extremities and remains somnolent.  However he self extubated around 2 PM, able to maintain airway, examined again around 3 PM, no stridor, still does not follow commands, intermittent chewing type movements, clear breath sounds bilateral, soft nontender, withdraws to painful stimulus.  Chest x-ray personally reviewed does not show any infiltrates or effusions. Labs show mild hyponatremia, increase sugars 200-300 range, no leukocytosis, mild anemia.  Impression/plan  Acute encephalopathy/seizure-concern for hypoglycemic injury, postictal should have improved by now.  EEG is negative, his agitation is resolved with extubation but can use Precedex if increased. Continue Keppra for now  Acute respiratory failure-resolved, self extubated, no stridor, able to maintain airway for now.  Type 1 diabetes, uncontrolled-he has brittle diabetes will reinstate 5 units Lantus and use SSI sensitive scale  My independent critical care time x 89m  Rakesh V. Elsworth Soho MD

## 2018-03-16 NOTE — Progress Notes (Signed)
NAME:  Edward Colon, MRN:  [id number], DOB:  January 31, 1981, LOS: 2 ADMISSION DATE:  03/14/2018, CONSULTATION DATE:  1/8 REFERRING MD:  Pfieffer (EDP), CHIEF COMPLAINT:  Hypoglycemia, AMS   Brief History   This is a 38 year old male with a history of hypoglycemic seizures on keppra, end-stage renal disease on dialysis TTS, and type 1 diabetes mellitus who was found down in his appartment. CBG 33 on EMS arrival with witnessed generalized seizure. Unknown down time.   Past Medical History  Anemia, depression, diabetes type 1, diabetic peripheral neuropathy, end-stage renal disease dialysis TTS, hypertension  Significant Hospital Events   1/8 >> Intubated, admit   Consults:  Neurology   Procedures:  1/8 ETT  Significant Diagnostic Tests:  1/8 CXR > negative 1/8 CT Head > No acute abnormality   Micro Data:  1/8 Blood cultures >> Coag negative staph  1/9 Resp cultures >> Gram Pos cocci  Antimicrobials:  1/8 Ancef x 1 dose   Interim history/subjective:  Sedated this morning on full vent support.   Objective   Blood pressure (!) 95/37, pulse 71, temperature 98.9 F (37.2 C), temperature source Axillary, resp. rate 18, height 6' (1.829 m), weight 86.4 kg, SpO2 100 %.    Vent Mode: PRVC FiO2 (%):  [40 %] 40 % Set Rate:  [18 bmp] 18 bmp Vt Set:  [560 mL] 560 mL PEEP:  [5 cmH20] 5 cmH20 Plateau Pressure:  [15 cmH20-17 cmH20] 17 cmH20   Intake/Output Summary (Last 24 hours) at 03/16/2018 0714 Last data filed at 03/16/2018 0600 Gross per 24 hour  Intake 1079.04 ml  Output 1955 ml  Net -875.96 ml   Filed Weights   03/15/18 0500 03/15/18 0716 03/16/18 0419  Weight: 91.4 kg 88.3 kg 86.4 kg    Examination: General: Sedated on vent HENT: ETT in place, thick secretions  Lungs: Bilateral rhonchi, no wheezing Cardiovascular: RRR, no m/r/g Abdomen: Soft, non distended Extremities: Warm, no edema Neuro: Sedated, unresponsive, pinpoint pupils    Resolved Hospital Problem list      Assessment & Plan:   Acute Metabolic Encephalopathy: Admitted with a hypoglycemic seizure. Found down by family after unknown down time.  Monitoring for neurologic recovery. CT head negative.  -- Wean precedex as able -- Consider MRI for anoxic / hypoxic injury if no improvement in the next 24-48 hours.   Seizure: In the setting of hypoglycemia. History of prior seizures, prescribed keppra. Suspect non compliance. Level subtherapeutic on admission.  -- Continue keppra -- Neurology consult  Acute Respiratory Failure: Post ictal in ED and unable to protect airway. High risk for aspiration PNA. Will plan for extubation once mental status improves.  -- Sputum cultures, CXR -- Full vent support -- Wean precedex as able -- Daily WUA and SBT -- VAP prevention   Type I DM: Takes Lantus 10 units at home. Profound hypoglycemia on admission. Improved with amps of D50 and D10 infusion. Holding long acting for now. Will need to restart at some point given he is type I. -- Continue sensitive SSI q4h with tube feeds -- CBG q4h  ESRD on HD (T/Th/Sat): no missed session per nephro. Electrolytes normal on admission.  -- HD per nephro  HTN:  -- Continue home Bidil -- Holding losartan, and spironolactone   Best practice:   Diet: Tube feeds Pain/Anxiety/Delirium protocol (if indicated): Precedex VAP protocol (if indicated): Yes DVT prophylaxis: Heparin GI prophylaxis: Protonix Glucose control: SSI q4 with tube feeds Mobility: Bedboung Code Status: Full Family Communication:  Discussed with father Disposition: Admit to ICU   Velna Ochs, M.D. - PGY3 Pager: [phone] 03/16/2018, 7:14 AM

## 2018-03-16 NOTE — Progress Notes (Addendum)
Inpatient Diabetes Program Recommendations  AACE/ADA: New Consensus Statement on Inpatient Glycemic Control (2015)  Target Ranges:  Prepandial:   less than 140 mg/dL      Peak postprandial:   less than 180 mg/dL (1-2 hours)      Critically ill patients:  140 - 180 mg/dL   Results for Edward Colon, Edward Colon (MRN [id number]) as of 03/16/2018 10:20  Ref. Range 03/14/2018 23:56 03/15/2018 01:03 03/15/2018 02:15 03/15/2018 03:37 03/15/2018 05:23 03/15/2018 06:59 03/15/2018 08:23 03/15/2018 11:00 03/15/2018 12:16 03/15/2018 13:21 03/15/2018 14:52 03/15/2018 19:44  Glucose-Capillary Latest Ref Range: 70 - 99 mg/dL 216 (H) 221 (H) 285 (H) 279 (H) 249 (H) 282 (H) 256 (H) 150 (H) 182 (H) 195 (H) 209 (H) 227 (H)  3 units NOVOLOG    Results for Edward Colon, Edward Colon (MRN [id number]) as of 03/16/2018 10:20  Ref. Range 03/16/2018 00:13 03/16/2018 04:10 03/16/2018 07:35  Glucose-Capillary Latest Ref Range: 70 - 99 mg/dL 160 (H)  2 units NOVOLOG  190 (H)  2 units NOVOLOG  308 (H)  7 units NOVOLOG      History: Type 1 DM  Home DM Meds: Lantus 10 units QHS  Current Orders: Novolog Sensitive Correction Scale/ SSI (0-9 units) Q4 hours     Tube feeds running at 70cc/hour.  Remains Intubated.  Had Dialysis yesterday.     MD- Note that Novolog SSi started yesterday at 8pm.  May consider starting a small dose of basal insulin--Concern that patient will be at risk for DKA without basal insulin:  May consider Lantus 5 units Daily (50% home dose)  Patient may also need Novolog Tube Feed Coverage added at some point as well--Note that Tube feeds initiated last PM     --Will follow patient during hospitalization--  Wyn Quaker RN, MSN, CDE Diabetes Coordinator Inpatient Glycemic Control Team Team Pager: [phone] (8a-5p)

## 2018-03-16 NOTE — Progress Notes (Addendum)
NEUROLOGY Progress Note  Reason for Consult: seizures  CC: unable; intubated   HPI: Edward Colon is an 38 y.o. male  with noncompliance and poor disease mgt of his multiple comorbidity. Due to this, current medical issues are complications of T1 DM, including blindness, ESRD on HD, HTN and seizures. Per family, they only report previous seizure in setting of severely hypoglycemica. No reported epilepsy or seizures outside of low glucose. He was started on Keppra at some point, but has not been compliant nor follows up with out pt neuro  To note, he had two days back to back ER visits for unconsciousness in setting of very low blood glucose and seizures. On 1/7 he left the ER AMA. On 1/8 his situation worsened and required intubation. On scene his CBG was 31 and EMS witnessed a generalized seizure.  Interval History: No further seizures since admit. D/w RN, weaned off propofol and has been on precedex. No family here currently. Withdraws in all ext, grimaces to stimuli throughout. Continues to be intubated.  Allergies No Known Allergies  Home Medications Medications Prior to Admission  Medication Sig Dispense Refill  . minoxidil (LONITEN) 2.5 MG tablet Take 2.5 mg by mouth 2 (two) times daily.    . calcium acetate (PHOSLO) 667 MG capsule Take 1 capsule (667 mg total) by mouth 3 (three) times daily with meals. (Patient taking differently: Take [phone] mg by mouth See admin instructions. Take 4 capsules (2668 mg) by mouth with meals and 2 capsules (1334 mg) with snacks) 90 capsule 0  . insulin glargine (LANTUS) 100 UNIT/ML injection Inject 0.1 mLs (10 Units total) into the skin at bedtime. 10 mL 11  . isosorbide-hydrALAZINE (BIDIL) 20-37.5 MG tablet Take 1 tablet by mouth 3 (three) times daily. 90 tablet 0  . levETIRAcetam (KEPPRA) 500 MG tablet Take 1 tablet (500 mg total) by mouth 2 (two) times daily. 60 tablet 2  . losartan (COZAAR) 50 MG tablet Take 1 tablet (50 mg total) by mouth  daily. 60 tablet 0  . multivitamin (RENA-VIT) TABS tablet Take 1 tablet by mouth at bedtime. (Patient taking differently: Take 1 tablet by mouth daily. ) 30 tablet 0  . NIFEdipine (ADALAT CC) 30 MG 24 hr tablet Take 1 tablet (30 mg total) by mouth 2 (two) times daily. 60 tablet 0  . spironolactone (ALDACTONE) 50 MG tablet Take 1 tablet (50 mg total) by mouth daily. 30 tablet 0    Hospital Medications . chlorhexidine gluconate (MEDLINE KIT)  15 mL Mouth Rinse BID  . Chlorhexidine Gluconate Cloth  6 each Topical Q0600  . heparin  5,000 Units Subcutaneous Q8H  . insulin aspart  0-9 Units Subcutaneous Q4H  . isosorbide-hydrALAZINE  1 tablet Per Tube TID  . mouth rinse  15 mL Mouth Rinse 10 times per day  . pantoprazole (PROTONIX) IV  40 mg Intravenous QHS     ROS: History obtained from pts dad per HPI. Unable to complete d/t pt unable to give any info.   Physical Examination:  Vitals:   03/16/18 0428 03/16/18 0500 03/16/18 0600 03/16/18 0700  BP:  (!) 107/42 (!) 95/37 (!) 144/64  Pulse:  70 71 66  Resp:  '18 18 18  ' Temp: 98.9 F (37.2 C)     TempSrc: Axillary     SpO2:  100% 100% 100%  Weight:      Height:        General - critically ill, intubated, appears stated age. Heart - Regular rate  and rhythm - no murmer Lungs - Clear to auscultation Abdomen - Soft - non tender Extremities - Distal pulses intact - no edema Skin - Warm and dry  Neurologic Examination:  Mental Status:  Off propofol, on precedex now. With min tactile stimuli, he briefly opens eyes, but doesn't attend or follow commands.  He is very strong and purposeful with movements in all 4 ext. localizes to stimuli with appropriate and symmetric facial grimace. Pupils are 1.19m, reactive to light on right only (blind at baseline in left eye). He is unable to respond to questions at this time. Limited exam d/t intubation and sedation.   LABORATORY STUDIES:  Basic Metabolic Panel: Recent Labs  Lab 03/12/18 2200  03/14/18 0827 03/14/18 1100 03/15/18 0809 03/15/18 1813 03/15/18 2306 03/16/18 0551  NA 139 140 138 131*  --   --  133*  K 4.7 4.5 4.3 5.5*  --   --  4.0  CL 106 100 99 96*  --   --  93*  CO2 18* 26  --  23  --   --  24  GLUCOSE 200* 155* 345* 333*  --   --  242*  BUN 34* 28* 30* 41*  --   --  27*  CREATININE 13.38* 10.85* 10.90* 12.98*  --   --  9.66*  CALCIUM 8.6* 8.2*  --  8.3*  --   --  8.1*  MG 2.4  --   --  2.1 1.8 2.1 2.1  PHOS  --   --   --  1.8* 4.2  --  5.1*    Liver Function Tests: Recent Labs  Lab 03/12/18 2200 03/14/18 0827  AST 37 41  ALT 18 22  ALKPHOS 66 66  BILITOT 0.6 0.9  PROT 7.9 7.7  ALBUMIN 4.0 3.6   No results for input(s): LIPASE, AMYLASE in the last 168 hours. No results for input(s): AMMONIA in the last 168 hours.  CBC: Recent Labs  Lab 03/12/18 2200 03/14/18 0827 03/14/18 1100 03/15/18 0809 03/16/18 0551  WBC 8.6 4.5  --  6.3 7.1  NEUTROABS 7.1 3.3  --   --   --   HGB 11.9* 12.8* 12.2* 11.5* 10.8*  HCT 38.2* 41.6 36.0* 36.9* 34.0*  MCV 94.3 95.0  --  90.7 89.0  PLT 218 313  --  229 219    Cardiac Enzymes: Recent Labs  Lab 03/12/18 2200  TROPONINI <0.03    BNP: Invalid input(s): POCBNP  CBG: Recent Labs  Lab 03/15/18 1452 03/15/18 1944 03/16/18 0013 03/16/18 0410 03/16/18 0735  GLUCAP 209* 227* 160* 190* 308*    Microbiology:   Coagulation Studies: Recent Labs    03/14/18 1455  LABPROT 13.1  INR 1.00    Urinalysis:  Recent Labs  Lab 03/14/18 1135  COLORURINE YELLOW  LABSPEC 1.014  PHURINE 8.0  GLUCOSEU >=500*  HGBUR NEGATIVE  BILIRUBINUR NEGATIVE  KETONESUR NEGATIVE  PROTEINUR >=300*  NITRITE NEGATIVE  LEUKOCYTESUR NEGATIVE    Lipid Panel:     Component Value Date/Time   TRIG 221 (H) 02/10/2018 0634    HgbA1C:  Lab Results  Component Value Date   HGBA1C 9.1 (H) 02/13/2018    Urine Drug Screen:      Component Value Date/Time   LABOPIA NONE DETECTED 04/29/2016 1214   COCAINSCRNUR  NONE DETECTED 04/29/2016 1214   LABBENZ NONE DETECTED 04/29/2016 1214   AMPHETMU NONE DETECTED 04/29/2016 1214   THCU NONE DETECTED 04/29/2016 1214   LABBARB NONE  DETECTED 04/29/2016 1214     Alcohol Level:  Recent Labs  Lab 03/12/18 2200  ETH <10    Miscellaneous labs:  EKG  EKG  IMAGING: Ct Head Wo Contrast  Result Date: 03/14/2018 CLINICAL DATA:  38 y/o  M; found unresponsive this a.m. EXAM: CT HEAD WITHOUT CONTRAST TECHNIQUE: Contiguous axial images were obtained from the base of the skull through the vertex without intravenous contrast. COMPARISON:  03/12/2017 CT head. FINDINGS: Brain: No evidence of acute infarction, hemorrhage, hydrocephalus, extra-axial collection or mass lesion/mass effect. Vascular: No hyperdense vessel or unexpected calcification. Skull: Normal. Negative for fracture or focal lesion. Sinuses/Orbits: Stable posterior left globe calcification and lentiform posterolateral collections compatible with chronic choroidal effusion or retinal detachment. Normal appearance of the right globe. Mucous retention cyst within the left maxillary sinus and mild sphenoid sinus mucosal thickening. Normal aeration of mastoid air cells. Other: None. IMPRESSION: No acute intracranial abnormality is identified. Stable CT of head. Electronically Signed   By: Kristine Garbe M.D.   On: 03/14/2018 16:49   Dg Chest Port 1 View  Result Date: 03/16/2018 CLINICAL DATA:  Encounter for intubation. EXAM: PORTABLE CHEST 1 VIEW COMPARISON:  Radiograph of March 15, 2018. FINDINGS: Stable cardiomegaly. Endotracheal and nasogastric tubes are unchanged in position. No pneumothorax or pleural effusion is noted. Left lung is clear. Minimal right basilar subsegmental atelectasis is noted. Bony thorax is unremarkable. IMPRESSION: Stable support apparatus. Minimal right basilar subsegmental atelectasis. Electronically Signed   By: Marijo Conception, M.D.   On: 03/16/2018 07:43   Dg Chest Port 1  View  Result Date: 03/15/2018 CLINICAL DATA:  Intubation.  Respiratory failure. EXAM: PORTABLE CHEST 1 VIEW COMPARISON:  03/14/2018. FINDINGS: Endotracheal tube and NG tube in stable position. Cardiomegaly. Interim improvement of pulmonary venous congestion. Mild atelectasis right lung base. Tiny right pleural effusion. No pneumothorax. IMPRESSION: 1.  Lines and tubes in stable position. 2.  Mild atelectasis right lung base.  Tiny right pleural effusion. 3. Stable cardiomegaly. Interim improvement of pulmonary venous congestion. Electronically Signed   By: Marcello Moores  Register   On: 03/15/2018 06:02   Dg Chest Portable 1 View  Result Date: 03/14/2018 CLINICAL DATA:  Intubation EXAM: PORTABLE CHEST 1 VIEW COMPARISON:  03/14/2018 FINDINGS: Endotracheal tube 6 cm above the carina. NG tube is in the stomach. Mild cardiomegaly and vascular congestion. No confluent opacities, effusions or edema. IMPRESSION: Support devices in expected position as above. Mild cardiomegaly, vascular congestion. Electronically Signed   By: Rolm Baptise M.D.   On: 03/14/2018 09:45   Dg Chest Port 1 View  Result Date: 03/14/2018 CLINICAL DATA:  Unresponsive EXAM: PORTABLE CHEST 1 VIEW COMPARISON:  March 12, 2018 FINDINGS: There is no appreciable edema or consolidation. There is stable cardiac prominence with pulmonary vascularity within normal limits. No adenopathy. No pneumothorax. No bone lesions. IMPRESSION: Stable cardiac prominence.  No edema or consolidation. Electronically Signed   By: Lowella Grip III M.D.   On: 03/14/2018 09:00   Dg Abd Portable 1v  Result Date: 03/15/2018 CLINICAL DATA:  Feeding tube placement EXAM: PORTABLE ABDOMEN - 1 VIEW COMPARISON:  03/15/2018 FINDINGS: Enteric tube has been withdrawn. Tip is now localized to the left upper quadrant, consistent with location in the upper body of the stomach. Scattered gas in the stomach and visualized small bowel/colon without abnormal distention. IMPRESSION: Enteric  tube tip is in the left upper quadrant, consistent with location in the upper body of the stomach. Electronically Signed   By: Gwyndolyn Saxon  Gerilyn Nestle M.D.   On: 03/15/2018 22:32   Dg Abd Portable 1v  Result Date: 03/15/2018 CLINICAL DATA:  NG tube EXAM: PORTABLE ABDOMEN - 1 VIEW COMPARISON:  03/14/2018 FINDINGS: NG tube is folded in the proximal stomach, tip in the stomach fundus. Mild gastric distention with air. Nonobstructive bowel gas pattern. Mild cardiac enlargement. Visualized bases are clear. No acute osseous finding. IMPRESSION: NG tube folded in the stomach. Electronically Signed   By: Jerilynn Mages.  Shick M.D.   On: 03/15/2018 19:34   Dg Abd Portable 1 View  Result Date: 03/14/2018 CLINICAL DATA:  Nasogastric tube placement. EXAM: PORTABLE ABDOMEN - 1 VIEW COMPARISON:  None. FINDINGS: The bowel gas pattern is normal. Distal tip of nasogastric tube is seen in proximal stomach. No radio-opaque calculi or other significant radiographic abnormality are seen. IMPRESSION: Distal tip of nasogastric tube seen in proximal stomach. No evidence of bowel obstruction or ileus. Electronically Signed   By: Marijo Conception, M.D.   On: 03/14/2018 09:46     Assessment/Plan: This is a 38yrold with noncompliance and poor disease mgt and non-compliance of his multiple comorbidity, primary all complications of T1 DM, including blindness, ESRD on HD.   # Seizures- in setting of extreme hypoglycemia. Pts dad cannot give any account of seizure events outside of setting of severe hypoglycemia. Unsure if there is underlying seizure disorder or if previous events have all been provoked. Pt is non-compliant with keppra. Will continue ASD for now. EEG done on sedation is without status or epileptiform activity. This will need further out pt work up if possible to determine if there is underlying seizure disorder or if these are all provoked only. # ESRD on HD- per renal team # Severe hypoglycemia- 33 , corrected and managed by CCM #  Acute hypoxic respiratory failure- intubated in ER. Continues on vent/sedation # T1 DM with complications of blindness and ESRD- complicated case with noncompliance.  # Agitation/encephalopathy- multiple issues, mostly metabolic issues d/t hypoxia, hypoglycemia, electrolytes, hypothermia etc.  # HTN- normotensive goals  RECS: Will adjust keppra for renal/HD donsing Supportive care and normalization of glucose and electrolytes and temp Seiure precautions Recommend out pt neuro f/u for proper diagnosis and to see if there is any underlying epilepsy (which we do not currently see) However d/t his non-compliance and resistance to f/u appts, doubt this will be obtainable. Therefore recommend con't keppra for now since we do not know this info.  Counsel when able about medical compliance.  LCSW or mental health consult once able to better understand barriers to care   Desiree Metzger-Cihelka, ARNP-C, ANVP-BC [facility]   If 7pm to 7am, please call on call as listed on AMION.   NEUROHOSPITALIST ADDENDUM Performed a face to face diagnostic evaluation.   I have reviewed the contents of history and physical exam as documented by PA/ARNP/Resident and agree with above documentation.  I have discussed and formulated the above plan as documented. Edits to the note have been made as needed.  Patient still intubated and on sedation.  Becomes very agitated when attempted to wean off sedation.  Continue Keppra, adjusted for renal dosing                                                     Angellica Maddison MD [facility] [phone]  If 7pm  to 7am, please call on call as listed on AMION.

## 2018-03-16 NOTE — Procedures (Signed)
Extubation Procedure Note  Patient Details:   Name: Edward Colon DOB: [birth year] MRN: [id number]   Airway Documentation:    Vent end date: 03/16/18 Vent end time: 1350   Evaluation  O2 sats: stable throughout Complications: No apparent complications Patient did tolerate procedure well. Bilateral Breath Sounds: Clear, Diminished   No   RT responded to vent alarm and found that patient had self extubated. Patient was placed on a 2L [place]. CCM resident immediately came to the bedside and called Dr. Elsworth Soho. Patient is not in any distress at this time, no stridor noted. BBS: Clear / diminished. Ventilator is at the patient's bedside on standby.    Inman Fettig L 03/16/2018, 1:50 PM

## 2018-03-17 LAB — CBC
HEMATOCRIT: 37.3 % — AB (ref 39.0–52.0)
Hemoglobin: 11.8 g/dL — ABNORMAL LOW (ref 13.0–17.0)
MCH: 28.4 pg (ref 26.0–34.0)
MCHC: 31.6 g/dL (ref 30.0–36.0)
MCV: 89.9 fL (ref 80.0–100.0)
Platelets: 208 10*3/uL (ref 150–400)
RBC: 4.15 MIL/uL — ABNORMAL LOW (ref 4.22–5.81)
RDW: 13.3 % (ref 11.5–15.5)
WBC: 6.5 10*3/uL (ref 4.0–10.5)
nRBC: 0 % (ref 0.0–0.2)

## 2018-03-17 LAB — MAGNESIUM: Magnesium: 2.4 mg/dL (ref 1.7–2.4)

## 2018-03-17 LAB — BASIC METABOLIC PANEL
Anion gap: 18 — ABNORMAL HIGH (ref 5–15)
BUN: 39 mg/dL — ABNORMAL HIGH (ref 6–20)
CALCIUM: 8.2 mg/dL — AB (ref 8.9–10.3)
CO2: 24 mmol/L (ref 22–32)
Chloride: 95 mmol/L — ABNORMAL LOW (ref 98–111)
Creatinine, Ser: 12.94 mg/dL — ABNORMAL HIGH (ref 0.61–1.24)
GFR calc Af Amer: 5 mL/min — ABNORMAL LOW (ref >60)
GFR, EST NON AFRICAN AMERICAN: 4 mL/min — AB (ref >60)
Glucose, Bld: 189 mg/dL — ABNORMAL HIGH (ref 70–99)
Potassium: 3.9 mmol/L (ref 3.5–5.1)
Sodium: 137 mmol/L (ref 135–145)

## 2018-03-17 LAB — CULTURE, BLOOD (ROUTINE X 2): Special Requests: ADEQUATE

## 2018-03-17 LAB — PHOSPHORUS: PHOSPHORUS: 6.4 mg/dL — AB (ref 2.5–4.6)

## 2018-03-17 LAB — GLUCOSE, CAPILLARY
Glucose-Capillary: 152 mg/dL — ABNORMAL HIGH (ref 70–99)
Glucose-Capillary: 170 mg/dL — ABNORMAL HIGH (ref 70–99)
Glucose-Capillary: 189 mg/dL — ABNORMAL HIGH (ref 70–99)
Glucose-Capillary: 200 mg/dL — ABNORMAL HIGH (ref 70–99)
Glucose-Capillary: 298 mg/dL — ABNORMAL HIGH (ref 70–99)
Glucose-Capillary: 318 mg/dL — ABNORMAL HIGH (ref 70–99)
Glucose-Capillary: 460 mg/dL — ABNORMAL HIGH (ref 70–99)

## 2018-03-17 LAB — CULTURE, RESPIRATORY W GRAM STAIN: Culture: NORMAL

## 2018-03-17 MED ORDER — LEVETIRACETAM 500 MG PO TABS
500.0000 mg | ORAL_TABLET | Freq: Every day | ORAL | Status: DC
  Administered 2018-03-18 – 2018-03-28 (×10): 500 mg via ORAL
  Filled 2018-03-17 (×12): qty 1

## 2018-03-17 MED ORDER — HEPARIN SODIUM (PORCINE) 1000 UNIT/ML IJ SOLN
INTRAMUSCULAR | Status: AC
  Administered 2018-03-17: 5000 [IU] via INTRAVENOUS_CENTRAL
  Filled 2018-03-17: qty 5

## 2018-03-17 MED ORDER — ORAL CARE MOUTH RINSE
15.0000 mL | Freq: Two times a day (BID) | OROMUCOSAL | Status: DC
  Administered 2018-03-20 – 2018-03-28 (×7): 15 mL via OROMUCOSAL

## 2018-03-17 MED ORDER — NIFEDIPINE ER OSMOTIC RELEASE 30 MG PO TB24
30.0000 mg | ORAL_TABLET | Freq: Two times a day (BID) | ORAL | Status: DC
  Administered 2018-03-17 – 2018-03-28 (×21): 30 mg via ORAL
  Filled 2018-03-17 (×25): qty 1

## 2018-03-17 MED ORDER — HEPARIN SODIUM (PORCINE) 1000 UNIT/ML DIALYSIS
5000.0000 [IU] | Freq: Once | INTRAMUSCULAR | Status: AC
  Administered 2018-03-17: 5000 [IU] via INTRAVENOUS_CENTRAL

## 2018-03-17 MED ORDER — LEVETIRACETAM 250 MG PO TABS
250.0000 mg | ORAL_TABLET | ORAL | Status: DC
  Administered 2018-03-17 – 2018-03-27 (×5): 250 mg via ORAL
  Filled 2018-03-17 (×7): qty 1

## 2018-03-17 NOTE — Progress Notes (Signed)
38 year old IDDM, ESRD on dialysis with known history of hypoglycemia. On his prior admission Lantus was decreased from 20 units to 10 units. He was admitted with witnessed seizure, CBG 33 on EMS arrival, intubated in ED for airway protection  He self extubated 1/10 and has done well since.  He was slow to turn around but mental status appears improved, follows commands and able to engage in conversation although his responses are slow.  Nonfocal, clear breath sounds bilateral, soft nontender abdomen, S1-S2 normal.  CBGs are 1 70-200 range Labs otherwise unremarkable, normal electrolytes.  Impression/plan  Acute encephalopathy/seizure-seems to have been related to hypoglycemia and improved, EEG negative Continue Keppra for now based on neurology's recommendation  ACute respiratory failure-resolved  Type 1 diabetes, uncontrolled, brittle -it seems that he was taking 25 units of Lantus, keep him on 5 units for now and use SSI sensitive scale and okay to accept sugars up to 200  Can transfer to floor and to triad 1/12  Kara Mead MD. Shade Flood. [facility] Pager [phone] If no response call [phone]   03/17/2018

## 2018-03-17 NOTE — Progress Notes (Signed)
New Admission Note:   Arrival Method:  Wheelchair from [employer] Mental Orientation: Alert and oriented x4 (slow to respond) Telemetry: Yes, BOX: 21 NSR Assessment: Completed Skin: Intact IV: Yes nsl Pain: 0 Tubes: None Safety Measures: Safety Fall Prevention Plan has been discussed  Admission: Transfer  5 [place] Orientation: Patient has been orientated to the room, unit and staff.   Family: not at bedside  Orders to be reviewed and implemented. Will continue to monitor the patient. Call light has been placed within reach and bed alarm has been activated.   Baldo Ash, RN

## 2018-03-17 NOTE — Progress Notes (Signed)
[facility] Progress Note  Subjective: self-extubated last night.   Vitals:   03/17/18 0404 03/17/18 0500 03/17/18 0630 03/17/18 0700  BP:  (!) 145/67 (!) 158/89 (!) 149/72  Pulse:  81 78 77  Resp:  15 12 12   Temp: 98.4 F (36.9 C)   98.4 F (36.9 C)  TempSrc: Axillary   Oral  SpO2:  100% 100% 100%  Weight:  89.5 kg    Height:        Inpatient medications: . Chlorhexidine Gluconate Cloth  6 each Topical Q0600  . heparin  5,000 Units Subcutaneous Q8H  . insulin aspart  0-9 Units Subcutaneous Q4H  . insulin glargine  5 Units Subcutaneous Daily  . isosorbide-hydrALAZINE  1 tablet Per Tube TID  . mouth rinse  15 mL Mouth Rinse BID  . pantoprazole (PROTONIX) IV  40 mg Intravenous QHS   . dexmedetomidine (PRECEDEX) IV infusion Stopped (03/16/18 1039)  . feeding supplement (VITAL AF 1.2 CAL) Stopped (03/16/18 1100)  . levETIRAcetam    . levETIRAcetam Stopped (03/17/18 0615)   acetaminophen, fentaNYL (SUBLIMAZE) injection, ondansetron (ZOFRAN) IV  Iron/TIBC/Ferritin/ %Sat    Component Value Date/Time   IRON 23 (L) 03/04/2015 0411   TIBC 192 (L) 03/04/2015 0411   FERRITIN 117 03/04/2015 0411   IRONPCTSAT 12 (L) 03/04/2015 0411    Exam: Gen off vent, no distress, calm No jvd or bruits Chest clear bilat RRR no MRG Abd soft ntnd no mass or ascites +bs Ext no LE edema Neuro is groggy, did not wake up LUA AVF+bruit    Home meds:  - isosorbide-hydralazine 20-37.5 bid/ losartan 50 qd/ nifedipine 30 bid/ spironolactone 25  - levetiracetam 500 bid  - insulin glargine 10 u qhs  - calc acetate 4 caps w meals   Dialysis: TTS SW  4h 84min  500/800  88.5kg  2/2.25   LUA AVF   Hep 5000  Sensipar 90  venofer 50/wk  Mircera 50 every 2 wks, last 12/28   Impression: 1. Hypoglycemia- pt found down, better 2. VDRF , resolved 3. Noncompliance w medications 4. ESRD on HD TTS 5. HTN - stable 6. Vol - no gross excess on exam 7. H/o seizure do 8. Anemia  ckd  P: 1. HD today upstairs, min UF 1 kg   Kelly Splinter MD [facility] pager [phone]   03/17/2018, 9:31 AM   Recent Labs  Lab 03/12/18 2200 03/14/18 0827  03/14/18 1455  03/16/18 0551 03/16/18 1711 03/17/18 0717  NA 139 140   < >  --    < > 133*  --  137  K 4.7 4.5   < >  --    < > 4.0  --  3.9  CL 106 100   < >  --    < > 93*  --  95*  CO2 18* 26  --   --    < > 24  --  24  GLUCOSE 200* 155*   < >  --    < > 242*  --  189*  BUN 34* 28*   < >  --    < > 27*  --  39*  CREATININE 13.38* 10.85*   < >  --    < > 9.66*  --  12.94*  CALCIUM 8.6* 8.2*  --   --    < > 8.1*  --  8.2*  PHOS  --   --   --   --    < >  5.1* 5.0* 6.4*  ALBUMIN 4.0 3.6  --   --   --   --   --   --   INR  --   --   --  1.00  --   --   --   --    < > = values in this interval not displayed.   Recent Labs  Lab 03/12/18 2200 03/14/18 0827  AST 37 41  ALT 18 22  ALKPHOS 66 66  BILITOT 0.6 0.9  PROT 7.9 7.7   Recent Labs  Lab 03/12/18 2200 03/14/18 0827  03/16/18 0551 03/17/18 0717  WBC 8.6 4.5   < > 7.1 6.5  NEUTROABS 7.1 3.3  --   --   --   HGB 11.9* 12.8*   < > 10.8* 11.8*  HCT 38.2* 41.6   < > 34.0* 37.3*  MCV 94.3 95.0   < > 89.0 89.9  PLT 218 313   < > 219 208   < > = values in this interval not displayed.

## 2018-03-18 LAB — GLUCOSE, CAPILLARY
GLUCOSE-CAPILLARY: 106 mg/dL — AB (ref 70–99)
GLUCOSE-CAPILLARY: 258 mg/dL — AB (ref 70–99)
GLUCOSE-CAPILLARY: 389 mg/dL — AB (ref 70–99)
Glucose-Capillary: 434 mg/dL — ABNORMAL HIGH (ref 70–99)
Glucose-Capillary: 15 mg/dL — CL (ref 70–99)
Glucose-Capillary: 340 mg/dL — ABNORMAL HIGH (ref 70–99)
Glucose-Capillary: 348 mg/dL — ABNORMAL HIGH (ref 70–99)

## 2018-03-18 MED ORDER — ISOSORB DINITRATE-HYDRALAZINE 20-37.5 MG PO TABS
1.0000 | ORAL_TABLET | Freq: Three times a day (TID) | ORAL | Status: DC
  Administered 2018-03-18 – 2018-03-28 (×29): 1 via ORAL
  Filled 2018-03-18 (×30): qty 1

## 2018-03-18 MED ORDER — INSULIN ASPART 100 UNIT/ML ~~LOC~~ SOLN
14.0000 [IU] | Freq: Once | SUBCUTANEOUS | Status: AC
  Administered 2018-03-18: 14 [IU] via SUBCUTANEOUS

## 2018-03-18 MED ORDER — RENA-VITE PO TABS
1.0000 | ORAL_TABLET | Freq: Every day | ORAL | Status: DC
  Administered 2018-03-18 – 2018-03-28 (×11): 1 via ORAL
  Filled 2018-03-18 (×11): qty 1

## 2018-03-18 MED ORDER — DEXTROSE 50 % IV SOLN
INTRAVENOUS | Status: AC
  Administered 2018-03-18: 50 mL
  Filled 2018-03-18: qty 50

## 2018-03-18 MED ORDER — INSULIN ASPART 100 UNIT/ML ~~LOC~~ SOLN
25.0000 [IU] | Freq: Once | SUBCUTANEOUS | Status: AC
  Administered 2018-03-18: 25 [IU] via SUBCUTANEOUS

## 2018-03-18 MED ORDER — CALCIUM ACETATE (PHOS BINDER) 667 MG PO CAPS
2001.0000 mg | ORAL_CAPSULE | Freq: Three times a day (TID) | ORAL | Status: DC
  Administered 2018-03-18 – 2018-03-28 (×25): 2001 mg via ORAL
  Filled 2018-03-18 (×29): qty 3

## 2018-03-18 NOTE — Progress Notes (Signed)
[facility] Progress Note Patient Name: Edward Colon DOB: [birth year] MRN: [id number]   Date of Service  03/18/2018  HPI/Events of Note  Hyperglycemia - Blood glucose = 434 after getting Novolog insulin 14 units [place] for a blood glucose = 460.   eICU Interventions  Will order: 1. Novolog insulin 25 units [place] X 1 now.      Intervention Category Major Interventions: Hyperglycemia - active titration of insulin therapy  Edward Colon 03/18/2018, 4:25 AM

## 2018-03-18 NOTE — Progress Notes (Addendum)
Reason for consult:   Subjective: Patient is extubated.  He is lying in bed, slightly somnolent but easily arousable.  Slightly confused but oriented to place, person reason for hospitalization.  He is following commands.   ROS: negative except above   Examination  Vital signs in last 24 hours: Temp:  [98.2 F (36.8 C)-98.9 F (37.2 C)] 98.2 F (36.8 C) (01/12 0744) Pulse Rate:  [74-86] 74 (01/12 0744) Resp:  [17-18] 18 (01/12 0539) BP: (138-169)/(71-92) 139/71 (01/12 0744) SpO2:  [98 %-100 %] 99 % (01/12 0744) Weight:  [85.1 kg-85.3 kg] 85.3 kg (01/12 0500)  General: lying in bed CVS: pulse-normal rate and rhythm RS: breathing comfortably Extremities: normal   Neuro: MS: somnolent , oriented, follows commands CN: pupils equal and reactive, blind in the right eye, EOMI, face symmetric, tongue midline, normal sensation over face, Motor: 5/5 strength in all 4 extremities Coordination: normal Gait: not tested  Basic Metabolic Panel: Recent Labs  Lab 03/12/18 2200 03/14/18 0827 03/14/18 1100 03/15/18 0809 03/15/18 1813 03/15/18 2306 03/16/18 0551 03/16/18 1711 03/17/18 0717  NA 139 140 138 131*  --   --  133*  --  137  K 4.7 4.5 4.3 5.5*  --   --  4.0  --  3.9  CL 106 100 99 96*  --   --  93*  --  95*  CO2 18* 26  --  23  --   --  24  --  24  GLUCOSE 200* 155* 345* 333*  --   --  242*  --  189*  BUN 34* 28* 30* 41*  --   --  27*  --  39*  CREATININE 13.38* 10.85* 10.90* 12.98*  --   --  9.66*  --  12.94*  CALCIUM 8.6* 8.2*  --  8.3*  --   --  8.1*  --  8.2*  MG 2.4  --   --  2.1 1.8 2.1 2.1 2.3 2.4  PHOS  --   --   --  1.8* 4.2  --  5.1* 5.0* 6.4*    CBC: Recent Labs  Lab 03/12/18 2200 03/14/18 0827 03/14/18 1100 03/15/18 0809 03/16/18 0551 03/17/18 0717  WBC 8.6 4.5  --  6.3 7.1 6.5  NEUTROABS 7.1 3.3  --   --   --   --   HGB 11.9* 12.8* 12.2* 11.5* 10.8* 11.8*  HCT 38.2* 41.6 36.0* 36.9* 34.0* 37.3*  MCV 94.3 95.0  --  90.7 89.0 89.9  PLT 218 313   --  229 219 208     Coagulation Studies: No results for input(s): LABPROT, INR in the last 72 hours.  Imaging Reviewed:     ASSESSMENT AND PLAN  Seizures  Seizures likely provoked in the setting of severe hypoglycemia, Has presented to the hospital on 2 prior occasions concerning for seizures.  Patient apparently on Keppra in the past however was noncompliant and has not follow-up with neurology. Has been started on Keppra during this admission EEG obtained was done with sedation and no epileptiform activity was seen  Recommendations Patient back to baseline and answering question, no need for MRI brain. Recommend continue Keppra for now, renally dosed -increase around the setting of hypoglycemia may be eventually discontinued. Outpatient neurology follow-up Continue to manage diabetes mellitus and normalization of glucose and electrolytes   Per [facility] statutes, patients with seizures are not allowed to drive until they have been seizure-free for six months. Use caution when  using heavy equipment or power tools. Avoid working on ladders or at heights. Take showers instead of baths. Ensure the water temperature is not too high on the home water heater. Do not go swimming alone. Do not lock yourself in a room alone (i.e. bathroom). When caring for infants or small children, sit down when holding, feeding, or changing them to minimize risk of injury to the child in the event you have a seizure. Maintain good sleep hygiene. Avoid alcohol.    If Edward Colon has another seizure, call 911 and bring them back to the ED if:       A.  The seizure lasts longer than 5 minutes.            B.  The patient doesn't wake shortly after the seizure or has new problems such as difficulty seeing, speaking or moving following the seizure       C.  The patient was injured during the seizure       D.  The patient has a temperature over 102 F (39C)       E.  The patient vomited during  the seizure and now is having trouble breathing   [name] [facility] Pager Number [phone] For questions after 7pm please refer to AMION to reach the Neurologist on call

## 2018-03-18 NOTE — Progress Notes (Signed)
PROGRESS NOTE   Edward Colon  TDD:[id number]    DOB: 1980/09/08    DOA: 03/14/2018  PCP: Nolene Ebbs, MD   I have briefly reviewed patients previous medical records in [facility].  Brief Narrative:  38 year old male, lives alone and independent, works in a Environmental [employer], PMH of IDDM type I with nephropathy, peripheral neuropathy, HTN, ESRD on TTS HD, seizures, prior history of hypoglycemia, admitted to ICU after he presented with witnessed seizure, CBG 33 on EMS arrival, intubated in ED for airway protection, self extubated 1/10.  Improved and CCM transferred care to [facility] on 03/18/2018.  CCM, nephrology, neurology consulted.  Course complicated by hypoglycemia.   Assessment & Plan:   Active Problems:   ESRD on dialysis [facility])   Altered mental status   Diabetic neuropathy (HCC)   Blindness of left eye with normal vision in contralateral eye   Agitation   Encephalopathy   1. Seizures: Likely provoked by severe hypoglycemia.  Neurology input appreciated.  He has reportedly presented to the hospital on 2 prior occasions concerning for seizures, on Keppra in the past but noncompliant, has not followed up with neurology.  EEG without epileptiform activity.  Neurology does not see need for MRI brain, recommends continuing [place] for now and may eventually be discontinued, outpatient neurology follow-up and better controll diabetes.  I reiterated 6 months driving restriction to patient and he verbalized understanding. 2. IDDM type I complicated by ESRD, peripheral neuropathy and hypoglycemia/brittle: Presented with hypoglycemia and seizures.  Overnight CBGs up to 460 mg per DL and received large doses/total of NovoLog 39 units within a 3-hour window which most likely caused recurrent hypoglycemia/CBG 15 this morning.  Resuscitated appropriately.  For now continue Lantus 5 units daily and NovoLog SSI.  Patient states that he was taking Lantus 25 units PTA although on recent admission had  been reduced to 10 units.  DM coordinator consulted and discussed with them, if has further hypoglycemia, may need to customize SSI and instead add low-dose mealtime NovoLog.  Reports that he follows with endocrinology. 3. Acute encephalopathy: Likely due to seizure/postictal and metabolic from glycemic control.  Continues to improve.  Monitor. 4. Acute respiratory failure with hypoxia: Due to seizure and altered mental status.  Intubated/self extubated 1/10.  Resolved. 5. ESRD on TTS HD: Last dialyzed 1/11.  Next HD 1/14.  Nephrology following. 6. Anemia of CKD: Stable. 7. Essential hypertension: Mildly uncontrolled.  Euvolemic on exam.  Continue BiDil and nifedipine.   DVT prophylaxis: Subcutaneous heparin Code Status: Full Family Communication: None at bedside Disposition: DC home pending clinical improvement, hopefully in the next 48 hours.   Consultants:  CCM-signed off 1/11 Nephrology Neurology  Procedures:  Intubated/extubated 1/10 HD  Antimicrobials:  None   Subjective: Interviewed and examined with RN.  Feels hungry.  Tired.  Has not been out of bed.  Denies complaints.  ROS: As above  Objective:  Vitals:   03/17/18 2003 03/18/18 0500 03/18/18 0539 03/18/18 0744  BP: (!) 161/86  (!) 159/85 139/71  Pulse: 86  76 74  Resp: 18  18   Temp: 98.7 F (37.1 C)  98.2 F (36.8 C) 98.2 F (36.8 C)  TempSrc: Oral  Oral Oral  SpO2: 98%  100% 99%  Weight: 85.3 kg 85.3 kg    Height:        Examination:  General exam: Pleasant young male, moderately built and nourished lying comfortably propped up in bed. Respiratory system: Clear to auscultation. Respiratory  effort normal. Cardiovascular system: S1 & S2 heard, RRR. No JVD, murmurs, rubs, gallops or clicks. No pedal edema. Gastrointestinal system: Abdomen is nondistended, soft and nontender. No organomegaly or masses felt. Normal bowel sounds heard. Central nervous system: Alert and oriented x2 and partially to time.  No focal neurological deficits. Extremities: Symmetric 5 x 5 power. Skin: No rashes, lesions or ulcers Psychiatry: Judgement and insight appear normal. Mood & affect flat.     Data Reviewed: I have personally reviewed following labs and imaging studies  CBC: Recent Labs  Lab 03/12/18 2200 03/14/18 0827 03/14/18 1100 03/15/18 0809 03/16/18 0551 03/17/18 0717  WBC 8.6 4.5  --  6.3 7.1 6.5  NEUTROABS 7.1 3.3  --   --   --   --   HGB 11.9* 12.8* 12.2* 11.5* 10.8* 11.8*  HCT 38.2* 41.6 36.0* 36.9* 34.0* 37.3*  MCV 94.3 95.0  --  90.7 89.0 89.9  PLT 218 313  --  229 219 161   Basic Metabolic Panel: Recent Labs  Lab 03/12/18 2200 03/14/18 0827 03/14/18 1100 03/15/18 0809 03/15/18 1813 03/15/18 2306 03/16/18 0551 03/16/18 1711 03/17/18 0717  NA 139 140 138 131*  --   --  133*  --  137  K 4.7 4.5 4.3 5.5*  --   --  4.0  --  3.9  CL 106 100 99 96*  --   --  93*  --  95*  CO2 18* 26  --  23  --   --  24  --  24  GLUCOSE 200* 155* 345* 333*  --   --  242*  --  189*  BUN 34* 28* 30* 41*  --   --  27*  --  39*  CREATININE 13.38* 10.85* 10.90* 12.98*  --   --  9.66*  --  12.94*  CALCIUM 8.6* 8.2*  --  8.3*  --   --  8.1*  --  8.2*  MG 2.4  --   --  2.1 1.8 2.1 2.1 2.3 2.4  PHOS  --   --   --  1.8* 4.2  --  5.1* 5.0* 6.4*   Liver Function Tests: Recent Labs  Lab 03/12/18 2200 03/14/18 0827  AST 37 41  ALT 18 22  ALKPHOS 66 66  BILITOT 0.6 0.9  PROT 7.9 7.7  ALBUMIN 4.0 3.6   Coagulation Profile: Recent Labs  Lab 03/14/18 1455  INR 1.00   Cardiac Enzymes: Recent Labs  Lab 03/12/18 2200  TROPONINI <0.03   HbA1C: No results for input(s): HGBA1C in the last 72 hours. CBG: Recent Labs  Lab 03/17/18 2333 03/18/18 0324 03/18/18 0733 03/18/18 0803 03/18/18 1109  GLUCAP 460* 434* 15* 106* 258*    Recent Results (from the past 240 hour(s))  Blood culture (routine x 2)     Status: Abnormal   Collection Time: 03/14/18  8:27 AM  Result Value Ref Range Status    Specimen Description BLOOD SITE NOT SPECIFIED  Final   Special Requests   Final    BOTTLES DRAWN AEROBIC AND ANAEROBIC Blood Culture adequate volume   Culture  Setup Time   Final    GRAM POSITIVE COCCI IN BOTH AEROBIC AND ANAEROBIC BOTTLES CRITICAL RESULT CALLED TO, READ BACK BY AND VERIFIED WITH: Denton Brick PHARMD [id number] 03/15/18 A BROWNING    Culture (A)  Final    STAPHYLOCOCCUS SPECIES (COAGULASE NEGATIVE) THE SIGNIFICANCE OF ISOLATING THIS ORGANISM FROM A SINGLE SET OF BLOOD CULTURES WHEN  MULTIPLE SETS ARE DRAWN IS UNCERTAIN. PLEASE NOTIFY THE MICROBIOLOGY DEPARTMENT WITHIN ONE WEEK IF SPECIATION AND SENSITIVITIES ARE REQUIRED. Performed at [facility], [place] [address], [place], [place] [address]    Report Status 03/17/2018 FINAL  Final  Blood Culture ID Panel (Reflexed)     Status: Abnormal   Collection Time: 03/14/18  8:27 AM  Result Value Ref Range Status   Enterococcus species NOT DETECTED NOT DETECTED Final   Listeria monocytogenes NOT DETECTED NOT DETECTED Final   Staphylococcus species DETECTED (A) NOT DETECTED Final    Comment: Methicillin (oxacillin) susceptible coagulase negative staphylococcus. Possible blood culture contaminant (unless isolated from more than one blood culture draw or clinical case suggests pathogenicity). No antibiotic treatment is indicated for blood  culture contaminants. CRITICAL RESULT CALLED TO, READ BACK BY AND VERIFIED WITH: Denton Brick PHARMD [id number] 03/15/18 A BROWNING    Staphylococcus aureus (BCID) NOT DETECTED NOT DETECTED Final   Methicillin resistance NOT DETECTED NOT DETECTED Final   Streptococcus species NOT DETECTED NOT DETECTED Final   Streptococcus agalactiae NOT DETECTED NOT DETECTED Final   Streptococcus pneumoniae NOT DETECTED NOT DETECTED Final   Streptococcus pyogenes NOT DETECTED NOT DETECTED Final   Acinetobacter baumannii NOT DETECTED NOT DETECTED Final   Enterobacteriaceae species NOT DETECTED NOT DETECTED Final   Enterobacter  cloacae complex NOT DETECTED NOT DETECTED Final   Escherichia coli NOT DETECTED NOT DETECTED Final   Klebsiella oxytoca NOT DETECTED NOT DETECTED Final   Klebsiella pneumoniae NOT DETECTED NOT DETECTED Final   Proteus species NOT DETECTED NOT DETECTED Final   Serratia marcescens NOT DETECTED NOT DETECTED Final   Haemophilus influenzae NOT DETECTED NOT DETECTED Final   Neisseria meningitidis NOT DETECTED NOT DETECTED Final   Pseudomonas aeruginosa NOT DETECTED NOT DETECTED Final   Candida albicans NOT DETECTED NOT DETECTED Final   Candida glabrata NOT DETECTED NOT DETECTED Final   Candida krusei NOT DETECTED NOT DETECTED Final   Candida parapsilosis NOT DETECTED NOT DETECTED Final   Candida tropicalis NOT DETECTED NOT DETECTED Final    Comment: Performed at [facility], [place]. [address], [place], [place] [address]  MRSA PCR Screening     Status: None   Collection Time: 03/14/18  2:39 PM  Result Value Ref Range Status   MRSA by PCR NEGATIVE NEGATIVE Final    Comment:        The GeneXpert MRSA Assay (FDA approved for NASAL specimens only), is one component of a comprehensive MRSA colonization surveillance program. It is not intended to diagnose MRSA infection nor to guide or monitor treatment for MRSA infections. Performed at [facility], [place] [address], [place], [place] [address]   Blood culture (routine x 2)     Status: None (Preliminary result)   Collection Time: 03/14/18  2:55 PM  Result Value Ref Range Status   Specimen Description BLOOD RIGHT ANTECUBITAL  Final   Special Requests AEROBIC BOTTLE ONLY Blood Culture adequate volume  Final   Culture   Final    NO GROWTH 4 DAYS Performed at [facility], [place] [address], [place], [place] [address]    Report Status PENDING  Incomplete  Culture, respiratory (non-expectorated)     Status: None   Collection Time: 03/15/18  7:38 AM  Result Value Ref Range Status   Specimen Description TRACHEAL ASPIRATE  Final    Special Requests NONE  Final   Gram Stain   Final    ABUNDANT WBC PRESENT,  PREDOMINANTLY PMN ABUNDANT GRAM POSITIVE COCCI Performed at [facility], [place] [address], [place], [place] [address]    Culture MODERATE Consistent with normal respiratory flora.  Final   Report Status 03/17/2018 FINAL  Final         Radiology Studies: No results found.      Scheduled Meds: . calcium acetate  2,001 mg Oral TID WC  . Chlorhexidine Gluconate Cloth  6 each Topical Q0600  . heparin  5,000 Units Subcutaneous Q8H  . insulin aspart  0-9 Units Subcutaneous Q4H  . insulin glargine  5 Units Subcutaneous Daily  . isosorbide-hydrALAZINE  1 tablet Per Tube TID  . levETIRAcetam  250 mg Oral Q T,Th,Sat-1800  . levETIRAcetam  500 mg Oral Q0600  . mouth rinse  15 mL Mouth Rinse BID  . multivitamin  1 tablet Oral QHS  . NIFEdipine  30 mg Oral BID   Continuous Infusions:   LOS: 4 days     Vernell Leep, MD, FACP, [facility]. [facility] Pager [phone]  If 7PM-7AM, please contact night-coverage [email or web address] Password [email or web address] 03/18/2018, 1:50 PM

## 2018-03-18 NOTE — Progress Notes (Signed)
Inpatient Diabetes Program Recommendations  AACE/ADA: New Consensus Statement on Inpatient Glycemic Control (2015)  Target Ranges:  Prepandial:   less than 140 mg/dL      Peak postprandial:   less than 180 mg/dL (1-2 hours)      Critically ill patients:  140 - 180 mg/dL   Lab Results  Component Value Date   GLUCAP 106 (H) 03/18/2018   HGBA1C 9.1 (H) 02/13/2018    Review of Glycemic ControlResults for VIRAAJ, VORNDRAN (MRN [id number]) as of 03/18/2018 10:04  Ref. Range 03/17/2018 16:40 03/17/2018 20:14 03/17/2018 20:36 03/17/2018 23:33 03/18/2018 03:24 03/18/2018 07:33 03/18/2018 08:03  Glucose-Capillary Latest Ref Range: 70 - 99 mg/dL 152 (H) 298 (H) 318 (H) 5 units Novolog 460 (H) 14 units Novolog 0104a 434 (H) 25 units of Novolog  0438 15 (LL) 106 (H)    Diabetes history: DM 1 Outpatient Diabetes medications: Lantus 10 units q HS Current orders for Inpatient glycemic control:  Novolog sensitive q 4 hours, Lantus 5 units daily  Inpatient Diabetes Program Recommendations:     Patient received 39 units of Novolog insulin overnight for elevated blood sugars.  He is a Type 1 and very sensitive to insulin as well as ESRD.  Diet is now ordered.    Reduce Novolog correction to custom scale starting at 151 mg/dL- Novolog Custom Correction ScaleTID CBG <70 - implement hypoglycemia protocol 70-120- 0 121-150 - 0 151-200 - 1 201-250 - 2 251-300 - 3 301-350 - 4 351-400 - 5 >400- call MD  - Add Novolog meal coverage 2 units tid with meals  Patient is high risk for low blood sugars.  Consider checking 2 am blood sugar as well.   Thanks,  Adah Perl, RN, BC-ADM Inpatient Diabetes Coordinator Pager [phone] (8a-5p)

## 2018-03-18 NOTE — Progress Notes (Signed)
[facility] Progress Note Patient Name: Edward Colon DOB: 10-Sep-1980 MRN: [id number]   Date of Service  03/18/2018  HPI/Events of Note  Hyperglycemia - Blood glucose = 460. Ate sandwich that family brought him.   eICU Interventions  Will cover him with Lantus 14 units [place] X 1.     Intervention Category Major Interventions: Hyperglycemia - active titration of insulin therapy  Lysle Dingwall 03/18/2018, 12:52 AM

## 2018-03-18 NOTE — Progress Notes (Signed)
CBG is 460, messaged MD on call.   Eleanora Neighbor, RN

## 2018-03-18 NOTE — Progress Notes (Addendum)
[facility] Progress Note   Subjective:   Seen and examined at bedside.  Reports HD tolerated well yesterday.  Feeling ok, just hungry.  Denies SOB, dyspnea, CP, n/v/d, abdominal pain, dizziness, weakness and edema..   Objective Vitals:   03/17/18 2003 03/18/18 0500 03/18/18 0539 03/18/18 0744  BP: (!) 161/86  (!) 159/85 139/71  Pulse: 86  76 74  Resp: 18  18   Temp: 98.7 F (37.1 C)  98.2 F (36.8 C) 98.2 F (36.8 C)  TempSrc: Oral  Oral Oral  SpO2: 98%  100% 99%  Weight: 85.3 kg 85.3 kg    Height:       Physical Exam General:NAD, WDWN, pleasant male Heart:RRR, no mrg Lungs:CTAB, nml WOB, no wheeze, rales, rhonchi Abdomen:soft, NTND Extremities:no LE edema Dialysis Access: LU AVF +b/t   Filed Weights   03/17/18 1546 03/17/18 2003 03/18/18 0500  Weight: 85.1 kg 85.3 kg 85.3 kg    Intake/Output Summary (Last 24 hours) at 03/18/2018 1133 Last data filed at 03/18/2018 0900 Gross per 24 hour  Intake 840 ml  Output 1000 ml  Net -160 ml    Additional Objective Labs: Basic Metabolic Panel: Recent Labs  Lab 03/15/18 0809  03/16/18 0551 03/16/18 1711 03/17/18 0717  NA 131*  --  133*  --  137  K 5.5*  --  4.0  --  3.9  CL 96*  --  93*  --  95*  CO2 23  --  24  --  24  GLUCOSE 333*  --  242*  --  189*  BUN 41*  --  27*  --  39*  CREATININE 12.98*  --  9.66*  --  12.94*  CALCIUM 8.3*  --  8.1*  --  8.2*  PHOS 1.8*   < > 5.1* 5.0* 6.4*   < > = values in this interval not displayed.   Liver Function Tests: Recent Labs  Lab 03/12/18 2200 03/14/18 0827  AST 37 41  ALT 18 22  ALKPHOS 66 66  BILITOT 0.6 0.9  PROT 7.9 7.7  ALBUMIN 4.0 3.6   CBC: Recent Labs  Lab 03/12/18 2200 03/14/18 0827  03/15/18 0809 03/16/18 0551 03/17/18 0717  WBC 8.6 4.5  --  6.3 7.1 6.5  NEUTROABS 7.1 3.3  --   --   --   --   HGB 11.9* 12.8*   < > 11.5* 10.8* 11.8*  HCT 38.2* 41.6   < > 36.9* 34.0* 37.3*  MCV 94.3 95.0  --  90.7 89.0 89.9  PLT 218 313  --  229  219 208   < > = values in this interval not displayed.   Blood Culture    Component Value Date/Time   SDES TRACHEAL ASPIRATE 03/15/2018 0738   SPECREQUEST NONE 03/15/2018 0738   CULT MODERATE Consistent with normal respiratory flora. 03/15/2018 0738   REPTSTATUS 03/17/2018 FINAL 03/15/2018 0738    Cardiac Enzymes: Recent Labs  Lab 03/12/18 2200  TROPONINI <0.03   CBG: Recent Labs  Lab 03/17/18 2333 03/18/18 0324 03/18/18 0733 03/18/18 0803 03/18/18 1109  GLUCAP 460* 434* 15* 106* 258*    Medications:  . Chlorhexidine Gluconate Cloth  6 each Topical Q0600  . heparin  5,000 Units Subcutaneous Q8H  . insulin aspart  0-9 Units Subcutaneous Q4H  . insulin glargine  5 Units Subcutaneous Daily  . isosorbide-hydrALAZINE  1 tablet Per Tube TID  . levETIRAcetam  250 mg Oral Q T,Th,Sat-1800  . levETIRAcetam  500 mg Oral Q0600  . mouth rinse  15 mL Mouth Rinse BID  . NIFEdipine  30 mg Oral BID    Dialysis Orders: TTS SW 4h 2min 500/800 88.5kg 2/2.25 LUA AVF Hep 5000 Sensipar 90 venofer 50/wk Mircera 50 every 2 wks, last 12/28  Assessment/Plan: 1. T1DM, uncontrolled w/Hypoglycemia - found down at home, now improved.  BS variable. Per primary 2. Acute encephalopathy/seizure - 2/2 hypoglycemia & non compliance with meds, improving 3. Acute respiratory failure - self extubated, breathing stable 4. ESRD -on HD TTS.  K 3.9. Next HD 1/14 5. Anemia of CKD- Hgb 11.8. No indication for ESA at this time.  6. Secondary hyperparathyroidism - Ca 8.2, Phos 6.4, continue sensipar, binders. Not on VDRA. 7. HTN/volume - BP well controlled.  Appears euvolemic on exam. 8. Nutrition - Renal diet w/fluid restrictions, renavite.  Jen Mow, PA-C [facility] Pager: [phone] 03/18/2018,11:33 AM  LOS: 4 days   Pt seen, examined and agree w A/P as above.  Kelly Splinter MD [employer] pager [phone]   03/18/2018, 1:35 PM

## 2018-03-19 LAB — GLUCOSE, CAPILLARY
Glucose-Capillary: 321 mg/dL — ABNORMAL HIGH (ref 70–99)
Glucose-Capillary: 131 mg/dL — ABNORMAL HIGH (ref 70–99)
Glucose-Capillary: 87 mg/dL (ref 70–99)
Glucose-Capillary: 410 mg/dL — ABNORMAL HIGH (ref 70–99)
Glucose-Capillary: 405 mg/dL — ABNORMAL HIGH (ref 70–99)
Glucose-Capillary: 217 mg/dL — ABNORMAL HIGH (ref 70–99)

## 2018-03-19 LAB — CULTURE, BLOOD (ROUTINE X 2)
Culture: NO GROWTH
Special Requests: ADEQUATE

## 2018-03-19 MED ORDER — INSULIN ASPART 100 UNIT/ML ~~LOC~~ SOLN
3.0000 [IU] | Freq: Three times a day (TID) | SUBCUTANEOUS | Status: DC
  Administered 2018-03-19 – 2018-03-21 (×6): 3 [IU] via SUBCUTANEOUS

## 2018-03-19 MED ORDER — INSULIN GLARGINE 100 UNIT/ML ~~LOC~~ SOLN
10.0000 [IU] | Freq: Every day | SUBCUTANEOUS | Status: DC
  Administered 2018-03-19 – 2018-03-20 (×2): 10 [IU] via SUBCUTANEOUS
  Filled 2018-03-19 (×2): qty 0.1

## 2018-03-19 NOTE — Consult Note (Signed)
   [facility] CM Inpatient Consult   03/19/2018  Edward Colon 04-14-1980 [id number]   Patient chart has been reviewed for readmissions less than 30 days and for high risk score 28% for unplanned readmissions and 2 hospitalizations within past 6 months.    Went to bedside to speak with [name] about [place] [facility] program. Patient was asleep at the time. Will continue to follow for progression and disposition plans.   [name], MSN, [facility] Liaison Nurse Mobile Phone [phone]  Toll free office [phone]

## 2018-03-19 NOTE — Progress Notes (Signed)
CRITICAL VALUE ALERT  Critical Value: CBG:405  Date & Time Notied: 03/19/18 1700  Provider Notified: Hongalgi  Orders Received/Actions taken: No further orders. Will continue to monitor.

## 2018-03-19 NOTE — Progress Notes (Signed)
CRITICAL VALUE ALERT  Critical Value: CBG:410  Date & Time Notied: 03/19/18 1100  Provider Notified: Hongalgi  Orders Received/Actions taken: MD gave verbal order to give max dose of sliding scale insulin. Orders placed for meal coverage. Diabetes coordinator consulted. Orders followed. Will continue to monitor.

## 2018-03-19 NOTE — Progress Notes (Signed)
[facility] Progress Note   Subjective:  Seen in room, sleepy but denies any acute issues today. No CP/dyspnea.  Objective Vitals:   03/18/18 1936 03/18/18 1939 03/19/18 0500 03/19/18 0852  BP: (!) 156/91   (!) 148/84  Pulse: 90   79  Resp: 16   18  Temp: 98.7 F (37.1 C)     TempSrc: Oral     SpO2: 100%   99%  Weight:  90.1 kg 90.1 kg   Height:       Physical Exam General: Well appearing man, NAD Heart: RRR; no murmur Lungs: CTA anteriorly Abdomen: soft, non-tender Extremities: No LE edema Dialysis Access:  LUE AVF + thrill  Additional Objective Labs: Basic Metabolic Panel: Recent Labs  Lab 03/15/18 0809  03/16/18 0551 03/16/18 1711 03/17/18 0717  NA 131*  --  133*  --  137  K 5.5*  --  4.0  --  3.9  CL 96*  --  93*  --  95*  CO2 23  --  24  --  24  GLUCOSE 333*  --  242*  --  189*  BUN 41*  --  27*  --  39*  CREATININE 12.98*  --  9.66*  --  12.94*  CALCIUM 8.3*  --  8.1*  --  8.2*  PHOS 1.8*   < > 5.1* 5.0* 6.4*   < > = values in this interval not displayed.   Liver Function Tests: Recent Labs  Lab 03/12/18 2200 03/14/18 0827  AST 37 41  ALT 18 22  ALKPHOS 66 66  BILITOT 0.6 0.9  PROT 7.9 7.7  ALBUMIN 4.0 3.6   CBC: Recent Labs  Lab 03/12/18 2200 03/14/18 0827  03/15/18 0809 03/16/18 0551 03/17/18 0717  WBC 8.6 4.5  --  6.3 7.1 6.5  NEUTROABS 7.1 3.3  --   --   --   --   HGB 11.9* 12.8*   < > 11.5* 10.8* 11.8*  HCT 38.2* 41.6   < > 36.9* 34.0* 37.3*  MCV 94.3 95.0  --  90.7 89.0 89.9  PLT 218 313  --  229 219 208   < > = values in this interval not displayed.   CBG: Recent Labs  Lab 03/18/18 2048 03/18/18 2128 03/19/18 0139 03/19/18 0500 03/19/18 0731  GLUCAP 348* 340* 321* 131* 87   Medications:  . calcium acetate  2,001 mg Oral TID WC  . Chlorhexidine Gluconate Cloth  6 each Topical Q0600  . heparin  5,000 Units Subcutaneous Q8H  . insulin aspart  0-9 Units Subcutaneous Q4H  . insulin glargine  10 Units  Subcutaneous Daily  . isosorbide-hydrALAZINE  1 tablet Oral TID  . levETIRAcetam  250 mg Oral Q T,Th,Sat-1800  . levETIRAcetam  500 mg Oral Q0600  . mouth rinse  15 mL Mouth Rinse BID  . multivitamin  1 tablet Oral QHS  . NIFEdipine  30 mg Oral BID    Dialysis Orders: TTS SW 4h 39min 500/800 88.5kg 2/2.25 LUA AVF Hep 5000 Sensipar 90 venofer 50/wk Mircera 50 every 2 wks, last 12/28  Assessment/Plan: 1. T1DM, uncontrolled w/Hypoglycemia - found down at home, now improved. Per primary 2. Acute encephalopathy/seizure - 2/2 hypoglycemia & non compliance with meds, improving 3. Acute respiratory failure - self extubated, breathing stable 4. ESRD -on HD TTS.  K 3.9. Next HD 1/14 5. Anemia of CKD- Hgb 11.8. No indication for ESA at this time.  6. Secondary hyperparathyroidism - Ca  8.2, Phos 6.4, continue sensipar, binders. Not on VDRA. 7. HTN/volume - BP well controlled.  Appears euvolemic on exam. 8. Nutrition - Renal diet w/fluid restrictions, renavite.  Veneta Penton, PA-C 03/19/2018, 10:48 AM  [facility] Pager: [phone]

## 2018-03-19 NOTE — Care Management Important Message (Signed)
Important Message  Patient Details  Name: Edward Colon MRN: [id number] Date of Birth: 11-16-1980   Medicare Important Message Given:  Yes    Orbie Pyo 03/19/2018, 4:11 PM

## 2018-03-19 NOTE — Progress Notes (Signed)
PROGRESS NOTE   Edward Colon  VQM:[id number]    DOB: 1980-11-24    DOA: 03/14/2018  PCP: Nolene Ebbs, MD   I have briefly reviewed patients previous medical records in [facility].  Brief Narrative:  38 year old male, lives alone and independent, works in a Environmental manager, PMH of IDDM type I with nephropathy, peripheral neuropathy, HTN, ESRD on TTS HD, seizures, prior history of hypoglycemia, admitted to ICU after he presented with witnessed seizure, CBG 33 on EMS arrival, intubated in ED for airway protection, self extubated 1/10.  Improved and CCM transferred care to [facility] on 03/18/2018.  CCM, nephrology, neurology consulted.  Course complicated by hypoglycemia.  Widely fluctuating CBGs, adjusting insulins.   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 Seizures: Likely provoked by severe hypoglycemia.  Neurology input appreciated.  He has reportedly presented to the hospital on 2 prior occasions concerning for seizures, on Keppra in the past but noncompliant, has not followed up with neurology.  EEG without epileptiform activity.  Neurology does not see need for MRI brain, recommends continuing [place] for now and may eventually be discontinued, outpatient neurology follow-up and better controll diabetes.  I reiterated 6 months driving restriction to patient and he verbalized understanding.  No reported seizures. 2. IDDM type I complicated by ESRD, peripheral neuropathy and hypoglycemia/brittle: Presented with hypoglycemia and seizures.  Overnight 1/11 CBGs up to 460 mg per DL and received large doses/total of NovoLog 39 units within a 3-hour window which most likely caused recurrent hypoglycemia/CBG 15 1/12 morning.  Resuscitated appropriately.  Patient states that he was taking Lantus 25 units PTA although on recent  admission had been reduced to 10 units.  Reports that he follows with endocrinology.  CBGs noted to be consistently increasing yesterday.  2-hour PP breakfast 410.  Increase Lantus from 5 to 10 units daily.  Continue NovoLog SSI, may consider bedtime scale.  Added NovoLog mealtime 3 units.  Monitor closely. 3. Acute encephalopathy: Likely due to seizure/postictal and metabolic from glycemic control.  Continues to improve.  Need to clarify baseline mental status with family if possible.  He is coherent but does not say much. 4. Acute respiratory failure with hypoxia: Due to seizure and altered mental status.  Intubated/self extubated 1/10.  Resolved. 5. ESRD on TTS HD: Last dialyzed 1/11.  Next HD 1/14.  Nephrology following. 6. Anemia of CKD: Stable. 7. Essential hypertension: Mildly uncontrolled.  Euvolemic on exam.  Continue BiDil and nifedipine.   DVT prophylaxis: Subcutaneous heparin Code Status: Full Family Communication: None at bedside Disposition: Mobilize, discussed with patient and RN.  DC home pending better control of his blood sugars.   Consultants:  CCM-signed off 1/11 Nephrology Neurology  Procedures:  Intubated/extubated 1/10 HD  Antimicrobials:  None   Subjective: Denies complaints.  Does not say much.  Has not been out of bed.  As per RN, no acute issues except elevated CBGs post breakfast. Ate 100% of his breakfast.  ROS: As above  Objective:  Vitals:   03/18/18 1936 03/18/18 1939 03/19/18 0500 03/19/18 0852  BP: (!) 156/91   (!) 148/84  Pulse: 90   79  Resp: 16   18  Temp: 98.7 F (37.1 C)     TempSrc: Oral     SpO2:  100%   99%  Weight:  90.1 kg 90.1 kg   Height:        Examination:  General exam: Pleasant young male, moderately built and nourished lying comfortably propped up in bed. Respiratory system: Clear to auscultation. Respiratory effort normal.  Stable Cardiovascular system: S1 & S2 heard, RRR. No JVD, murmurs, rubs, gallops or clicks.  No pedal edema. Gastrointestinal system: Abdomen is nondistended, soft and nontender. No organomegaly or masses felt. Normal bowel sounds heard. Central nervous system: Alert and oriented. No focal neurological deficits. Extremities: Symmetric 5 x 5 power. Skin: No rashes, lesions or ulcers Psychiatry: Judgement and insight appear normal. Mood & affect flat.     Data Reviewed: I have personally reviewed following labs and imaging studies  CBC: Recent Labs  Lab 03/12/18 2200 03/14/18 0827 03/14/18 1100 03/15/18 0809 03/16/18 0551 03/17/18 0717  WBC 8.6 4.5  --  6.3 7.1 6.5  NEUTROABS 7.1 3.3  --   --   --   --   HGB 11.9* 12.8* 12.2* 11.5* 10.8* 11.8*  HCT 38.2* 41.6 36.0* 36.9* 34.0* 37.3*  MCV 94.3 95.0  --  90.7 89.0 89.9  PLT 218 313  --  229 219 850   Basic Metabolic Panel: Recent Labs  Lab 03/12/18 2200 03/14/18 0827 03/14/18 1100 03/15/18 0809 03/15/18 1813 03/15/18 2306 03/16/18 0551 03/16/18 1711 03/17/18 0717  NA 139 140 138 131*  --   --  133*  --  137  K 4.7 4.5 4.3 5.5*  --   --  4.0  --  3.9  CL 106 100 99 96*  --   --  93*  --  95*  CO2 18* 26  --  23  --   --  24  --  24  GLUCOSE 200* 155* 345* 333*  --   --  242*  --  189*  BUN 34* 28* 30* 41*  --   --  27*  --  39*  CREATININE 13.38* 10.85* 10.90* 12.98*  --   --  9.66*  --  12.94*  CALCIUM 8.6* 8.2*  --  8.3*  --   --  8.1*  --  8.2*  MG 2.4  --   --  2.1 1.8 2.1 2.1 2.3 2.4  PHOS  --   --   --  1.8* 4.2  --  5.1* 5.0* 6.4*   Liver Function Tests: Recent Labs  Lab 03/12/18 2200 03/14/18 0827  AST 37 41  ALT 18 22  ALKPHOS 66 66  BILITOT 0.6 0.9  PROT 7.9 7.7  ALBUMIN 4.0 3.6   Coagulation Profile: Recent Labs  Lab 03/14/18 1455  INR 1.00   Cardiac Enzymes: Recent Labs  Lab 03/12/18 2200  TROPONINI <0.03   HbA1C: No results for input(s): HGBA1C in the last 72 hours. CBG: Recent Labs  Lab 03/18/18 2128 03/19/18 0139 03/19/18 0500 03/19/18 0731 03/19/18 1054  GLUCAP  340* 321* 131* 87 410*    Recent Results (from the past 240 hour(s))  Blood culture (routine x 2)     Status: Abnormal   Collection Time: 03/14/18  8:27 AM  Result Value Ref Range Status   Specimen Description BLOOD SITE NOT SPECIFIED  Final   Special Requests   Final    BOTTLES DRAWN AEROBIC AND ANAEROBIC Blood Culture adequate volume   Culture  Setup Time   Final    GRAM POSITIVE COCCI IN BOTH AEROBIC AND ANAEROBIC BOTTLES CRITICAL RESULT CALLED TO,  READ BACK BY AND VERIFIED WITH: Denton Brick PHARMD [id number] 03/15/18 A BROWNING    Culture (A)  Final    STAPHYLOCOCCUS SPECIES (COAGULASE NEGATIVE) THE SIGNIFICANCE OF ISOLATING THIS ORGANISM FROM A SINGLE SET OF BLOOD CULTURES WHEN MULTIPLE SETS ARE DRAWN IS UNCERTAIN. PLEASE NOTIFY THE MICROBIOLOGY DEPARTMENT WITHIN ONE WEEK IF SPECIATION AND SENSITIVITIES ARE REQUIRED. Performed at [facility], [place] [address], [place], [place] [address]    Report Status 03/17/2018 FINAL  Final  Blood Culture ID Panel (Reflexed)     Status: Abnormal   Collection Time: 03/14/18  8:27 AM  Result Value Ref Range Status   Enterococcus species NOT DETECTED NOT DETECTED Final   Listeria monocytogenes NOT DETECTED NOT DETECTED Final   Staphylococcus species DETECTED (A) NOT DETECTED Final    Comment: Methicillin (oxacillin) susceptible coagulase negative staphylococcus. Possible blood culture contaminant (unless isolated from more than one blood culture draw or clinical case suggests pathogenicity). No antibiotic treatment is indicated for blood  culture contaminants. CRITICAL RESULT CALLED TO, READ BACK BY AND VERIFIED WITH: Denton Brick PHARMD [id number] 03/15/18 A BROWNING    Staphylococcus aureus (BCID) NOT DETECTED NOT DETECTED Final   Methicillin resistance NOT DETECTED NOT DETECTED Final   Streptococcus species NOT DETECTED NOT DETECTED Final   Streptococcus agalactiae NOT DETECTED NOT DETECTED Final   Streptococcus pneumoniae NOT DETECTED NOT DETECTED Final    Streptococcus pyogenes NOT DETECTED NOT DETECTED Final   Acinetobacter baumannii NOT DETECTED NOT DETECTED Final   Enterobacteriaceae species NOT DETECTED NOT DETECTED Final   Enterobacter cloacae complex NOT DETECTED NOT DETECTED Final   Escherichia coli NOT DETECTED NOT DETECTED Final   Klebsiella oxytoca NOT DETECTED NOT DETECTED Final   Klebsiella pneumoniae NOT DETECTED NOT DETECTED Final   Proteus species NOT DETECTED NOT DETECTED Final   Serratia marcescens NOT DETECTED NOT DETECTED Final   Haemophilus influenzae NOT DETECTED NOT DETECTED Final   Neisseria meningitidis NOT DETECTED NOT DETECTED Final   Pseudomonas aeruginosa NOT DETECTED NOT DETECTED Final   Candida albicans NOT DETECTED NOT DETECTED Final   Candida glabrata NOT DETECTED NOT DETECTED Final   Candida krusei NOT DETECTED NOT DETECTED Final   Candida parapsilosis NOT DETECTED NOT DETECTED Final   Candida tropicalis NOT DETECTED NOT DETECTED Final    Comment: Performed at [facility], [place]. [address], [place], [place] [address]  MRSA PCR Screening     Status: None   Collection Time: 03/14/18  2:39 PM  Result Value Ref Range Status   MRSA by PCR NEGATIVE NEGATIVE Final    Comment:        The GeneXpert MRSA Assay (FDA approved for NASAL specimens only), is one component of a comprehensive MRSA colonization surveillance program. It is not intended to diagnose MRSA infection nor to guide or monitor treatment for MRSA infections. Performed at [facility], [place] [address] [place], [place] [address]   Blood culture (routine x 2)     Status: None   Collection Time: 03/14/18  2:55 PM  Result Value Ref Range Status   Specimen Description BLOOD RIGHT ANTECUBITAL  Final   Special Requests AEROBIC BOTTLE ONLY Blood Culture adequate volume  Final   Culture   Final    NO GROWTH 5 DAYS Performed at [facility], [place] [address] [place], [place] [address]    Report Status 03/19/2018 FINAL  Final    Culture, respiratory (non-expectorated)     Status: None  Collection Time: 03/15/18  7:38 AM  Result Value Ref Range Status   Specimen Description TRACHEAL ASPIRATE  Final   Special Requests NONE  Final   Gram Stain   Final    ABUNDANT WBC PRESENT, PREDOMINANTLY PMN ABUNDANT GRAM POSITIVE COCCI Performed at [facility], [place] [address], [place], [place] [address]    Culture MODERATE Consistent with normal respiratory flora.  Final   Report Status 03/17/2018 FINAL  Final         Radiology Studies: No results found.      Scheduled Meds: . calcium acetate  2,001 mg Oral TID WC  . Chlorhexidine Gluconate Cloth  6 each Topical Q0600  . heparin  5,000 Units Subcutaneous Q8H  . insulin aspart  0-9 Units Subcutaneous Q4H  . insulin aspart  3 Units Subcutaneous TID WC  . insulin glargine  10 Units Subcutaneous Daily  . isosorbide-hydrALAZINE  1 tablet Oral TID  . levETIRAcetam  250 mg Oral Q T,Th,Sat-1800  . levETIRAcetam  500 mg Oral Q0600  . mouth rinse  15 mL Mouth Rinse BID  . multivitamin  1 tablet Oral QHS  . NIFEdipine  30 mg Oral BID   Continuous Infusions:   LOS: 5 days     Vernell Leep, MD, FACP, [facility]. [facility] Pager [phone]  If 7PM-7AM, please contact night-coverage [email or web address] Password [facility] 03/19/2018, 11:35 AM

## 2018-03-19 NOTE — Progress Notes (Signed)
Patient refused ambulation in hallway. Education provided. Will continue to encourage patient.

## 2018-03-20 LAB — RENAL FUNCTION PANEL
Albumin: 3 g/dL — ABNORMAL LOW (ref 3.5–5.0)
Anion gap: 16 — ABNORMAL HIGH (ref 5–15)
BUN: 61 mg/dL — ABNORMAL HIGH (ref 6–20)
CO2: 22 mmol/L (ref 22–32)
Calcium: 8.6 mg/dL — ABNORMAL LOW (ref 8.9–10.3)
Chloride: 96 mmol/L — ABNORMAL LOW (ref 98–111)
Creatinine, Ser: 13.92 mg/dL — ABNORMAL HIGH (ref 0.61–1.24)
GFR calc Af Amer: 5 mL/min — ABNORMAL LOW (ref >60)
GFR calc non Af Amer: 4 mL/min — ABNORMAL LOW (ref >60)
Glucose, Bld: 373 mg/dL — ABNORMAL HIGH (ref 70–99)
Phosphorus: 5.8 mg/dL — ABNORMAL HIGH (ref 2.5–4.6)
Potassium: 5.4 mmol/L — ABNORMAL HIGH (ref 3.5–5.1)
Sodium: 134 mmol/L — ABNORMAL LOW (ref 135–145)

## 2018-03-20 LAB — GLUCOSE, CAPILLARY
Glucose-Capillary: 143 mg/dL — ABNORMAL HIGH (ref 70–99)
Glucose-Capillary: 325 mg/dL — ABNORMAL HIGH (ref 70–99)
Glucose-Capillary: 374 mg/dL — ABNORMAL HIGH (ref 70–99)
Glucose-Capillary: 408 mg/dL — ABNORMAL HIGH (ref 70–99)
Glucose-Capillary: 58 mg/dL — ABNORMAL LOW (ref 70–99)
Glucose-Capillary: 89 mg/dL (ref 70–99)

## 2018-03-20 LAB — CBC
HCT: 34.1 % — ABNORMAL LOW (ref 39.0–52.0)
Hemoglobin: 11.3 g/dL — ABNORMAL LOW (ref 13.0–17.0)
MCH: 29.4 pg (ref 26.0–34.0)
MCHC: 33.1 g/dL (ref 30.0–36.0)
MCV: 88.6 fL (ref 80.0–100.0)
Platelets: 308 10*3/uL (ref 150–400)
RBC: 3.85 MIL/uL — AB (ref 4.22–5.81)
RDW: 13 % (ref 11.5–15.5)
WBC: 4.2 10*3/uL (ref 4.0–10.5)
nRBC: 0 % (ref 0.0–0.2)

## 2018-03-20 MED ORDER — INSULIN ASPART 100 UNIT/ML ~~LOC~~ SOLN
0.0000 [IU] | SUBCUTANEOUS | Status: DC
  Administered 2018-03-20 (×2): 5 [IU] via SUBCUTANEOUS
  Administered 2018-03-21: 2 [IU] via SUBCUTANEOUS
  Administered 2018-03-21: 5 [IU] via SUBCUTANEOUS

## 2018-03-20 MED ORDER — HEPARIN SODIUM (PORCINE) 1000 UNIT/ML DIALYSIS: 20.0000 [IU]/kg | INTRAMUSCULAR | Status: DC | PRN

## 2018-03-20 MED ORDER — NEPRO/CARBSTEADY PO LIQD
237.0000 mL | Freq: Two times a day (BID) | ORAL | Status: DC
  Administered 2018-03-21 – 2018-03-28 (×4): 237 mL via ORAL
  Filled 2018-03-20 (×17): qty 237

## 2018-03-20 MED ORDER — INSULIN GLARGINE 100 UNIT/ML ~~LOC~~ SOLN
12.0000 [IU] | Freq: Every day | SUBCUTANEOUS | Status: DC
  Administered 2018-03-21 – 2018-03-24 (×4): 12 [IU] via SUBCUTANEOUS
  Filled 2018-03-20 (×5): qty 0.12

## 2018-03-20 NOTE — Progress Notes (Signed)
PROGRESS NOTE   Edward Colon  GDJ:[id number]    DOB: 30-Jan-1981    DOA: 03/14/2018  PCP: Nolene Ebbs, MD   I have briefly reviewed patients previous medical records in [facility].  Brief Narrative:  38 year old male, lives alone and independent, works in a Environmental manager, PMH of IDDM type I with nephropathy, peripheral neuropathy, HTN, ESRD on TTS HD, seizures, prior history of hypoglycemia, admitted to ICU after he presented with witnessed seizure, CBG 33 on EMS arrival, intubated in ED for airway protection, self extubated 1/10.  Improved and CCM transferred care to [facility] on 03/18/2018.  CCM, nephrology, neurology consulted.  Course complicated by hypoglycemia.  Widely fluctuating CBGs, adjusting insulins.   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 Seizures: Likely provoked by severe hypoglycemia.  Neurology input appreciated.  He has reportedly presented to the hospital on 2 prior occasions concerning for seizures, on Keppra in the past but noncompliant, has not followed up with neurology.  EEG without epileptiform activity.  Neurology does not see need for MRI brain, recommends continuing [place] for now and may eventually be discontinued, outpatient neurology follow-up and better controll diabetes.  I reiterated 6 months driving restriction to patient and he verbalized understanding.  No reported seizures.  Given mental status changes noted below, sluggish/, slow to respond, consider discussing with neurology in a.m. regarding cutting back on Keppra dose. 2. Brittle IDDM type I complicated by ESRD, peripheral neuropathy and hypoglycemia: Presented with hypoglycemia and seizures.  Ongoing wildly fluctuating CBGs ranging from hyperglycemia/400s to hypoglycemia/50s.  Patient states that he was taking Lantus 25  units PTA although on recent admission had been reduced to 10 units.  Reports that he follows with endocrinology/Dr. Delrae Rend.  Increased Lantus to 10 units daily.  Discussed extensively with DM coordinator, may consider increasing Lantus to 12 units daily in a.m., continue NovoLog mealtime 3 units, NovoLog custom correction scale.  Monitor closely and adjust as needed.  If continue to have wide fluctuations, please consider discussing with Dr. Buddy Duty in a.m.  Patient seems to be a poor historian and also issues with compliance. 3. Acute encephalopathy: Likely due to seizure/postictal and metabolic from glycemic control.  I discussed with patient's father at length on 1/14, he met him this morning and feels that patient is sleeping too much and also slow to respond.  This is not his usual self.  He also agrees that patient likely has been noncompliant with his diabetes care as outpatient. 4. Acute respiratory failure with hypoxia: Due to seizure and altered mental status.  Intubated/self extubated 1/10.  Resolved. 5. ESRD on TTS HD: Nephrology following.  Seen this morning at HD.  As per father, patient was followed at [facility] transplant center. 6. Anemia of CKD: Stable. 7. Essential hypertension: Better controlled today.  Euvolemic on exam.  Continue BiDil and nifedipine. 8. Hyperkalemia: Potassium 5.4 on 1/14.  Managed across HD by nephrology. 9. Noncompliance:   DVT prophylaxis: Subcutaneous heparin Code Status: Full Family Communication: I discussed in detail with patient's father on 1/14, updated care and answered questions. Disposition: Need to mobilize but patient declining.  DC home pending stability of glycemic control.   Consultants:  CCM-signed off 1/11 Nephrology Neurology  Procedures:  Intubated/extubated 1/10 HD  Antimicrobials:  None   Subjective: Patient seen this morning at HD.  Denied complaints.  Reports that he did not eat any outside food and has been consuming  hospital foods alone.  As per patient's father, he has been seeing him regularly in the hospital, met him this morning at dialysis.  He feels that patient is sleeping a lot, slow to respond but when he does respond, responses seem to be appropriate.  He believes that patient is not fully compliant with his medical care.  ROS: As above  Objective:  Vitals:   03/20/18 1030 03/20/18 1100 03/20/18 1130 03/20/18 1200  BP: (!) 145/82 130/76 125/85 132/79  Pulse: 86 88 87 86  Resp:      Temp:      TempSrc:      SpO2:      Weight:      Height:        Examination:  General exam: Pleasant young male, moderately built and nourished lying comfortably supine in bed undergoing HD this morning. Respiratory system: Clear to auscultation. Respiratory effort normal.  Stable Cardiovascular system: S1 & S2 heard, RRR. No JVD, murmurs, rubs, gallops or clicks. No pedal edema.  Stable Gastrointestinal system: Abdomen is nondistended, soft and nontender. No organomegaly or masses felt. Normal bowel sounds heard.  No changes/stable Central nervous system: Alert and oriented to person, place and partly to time. No focal neurological deficits. Extremities: Symmetric 5 x 5 power. Skin: No rashes, lesions or ulcers Psychiatry: Judgement and insight appear normal. Mood & affect flat.     Data Reviewed: I have personally reviewed following labs and imaging studies  CBC: Recent Labs  Lab 03/14/18 0827 03/14/18 1100 03/15/18 0809 03/16/18 0551 03/17/18 0717 03/20/18 0813  WBC 4.5  --  6.3 7.1 6.5 4.2  NEUTROABS 3.3  --   --   --   --   --   HGB 12.8* 12.2* 11.5* 10.8* 11.8* 11.3*  HCT 41.6 36.0* 36.9* 34.0* 37.3* 34.1*  MCV 95.0  --  90.7 89.0 89.9 88.6  PLT 313  --  229 219 208 056   Basic Metabolic Panel: Recent Labs  Lab 03/14/18 0827 03/14/18 1100  03/15/18 0809 03/15/18 1813 03/15/18 2306 03/16/18 0551 03/16/18 1711 03/17/18 0717 03/20/18 0813  NA 140 138  --  131*  --   --  133*   --  137 134*  K 4.5 4.3  --  5.5*  --   --  4.0  --  3.9 5.4*  CL 100 99  --  96*  --   --  93*  --  95* 96*  CO2 26  --   --  23  --   --  24  --  24 22  GLUCOSE 155* 345*  --  333*  --   --  242*  --  189* 373*  BUN 28* 30*  --  41*  --   --  27*  --  39* 61*  CREATININE 10.85* 10.90*  --  12.98*  --   --  9.66*  --  12.94* 13.92*  CALCIUM 8.2*  --   --  8.3*  --   --  8.1*  --  8.2* 8.6*  MG  --   --    < > 2.1 1.8 2.1 2.1 2.3 2.4  --   PHOS  --   --    < > 1.8* 4.2  --  5.1* 5.0* 6.4* 5.8*   < > =  values in this interval not displayed.   Liver Function Tests: Recent Labs  Lab 03/14/18 0827 03/20/18 0813  AST 41  --   ALT 22  --   ALKPHOS 66  --   BILITOT 0.9  --   PROT 7.7  --   ALBUMIN 3.6 3.0*   Coagulation Profile: Recent Labs  Lab 03/14/18 1455  INR 1.00   CBG: Recent Labs  Lab 03/20/18 0008 03/20/18 0414 03/20/18 0440 03/20/18 1314 03/20/18 1612  GLUCAP 143* 58* 89 325* 408*    Recent Results (from the past 240 hour(s))  Blood culture (routine x 2)     Status: Abnormal   Collection Time: 03/14/18  8:27 AM  Result Value Ref Range Status   Specimen Description BLOOD SITE NOT SPECIFIED  Final   Special Requests   Final    BOTTLES DRAWN AEROBIC AND ANAEROBIC Blood Culture adequate volume   Culture  Setup Time   Final    GRAM POSITIVE COCCI IN BOTH AEROBIC AND ANAEROBIC BOTTLES CRITICAL RESULT CALLED TO, READ BACK BY AND VERIFIED WITH: Denton Brick PHARMD [id number] 03/15/18 A BROWNING    Culture (A)  Final    STAPHYLOCOCCUS SPECIES (COAGULASE NEGATIVE) THE SIGNIFICANCE OF ISOLATING THIS ORGANISM FROM A SINGLE SET OF BLOOD CULTURES WHEN MULTIPLE SETS ARE DRAWN IS UNCERTAIN. PLEASE NOTIFY THE MICROBIOLOGY DEPARTMENT WITHIN ONE WEEK IF SPECIATION AND SENSITIVITIES ARE REQUIRED. Performed at [facility], [place] [address] [place], [place] [address]    Report Status 03/17/2018 FINAL  Final  Blood Culture ID Panel (Reflexed)     Status: Abnormal   Collection Time:  03/14/18  8:27 AM  Result Value Ref Range Status   Enterococcus species NOT DETECTED NOT DETECTED Final   Listeria monocytogenes NOT DETECTED NOT DETECTED Final   Staphylococcus species DETECTED (A) NOT DETECTED Final    Comment: Methicillin (oxacillin) susceptible coagulase negative staphylococcus. Possible blood culture contaminant (unless isolated from more than one blood culture draw or clinical case suggests pathogenicity). No antibiotic treatment is indicated for blood  culture contaminants. CRITICAL RESULT CALLED TO, READ BACK BY AND VERIFIED WITH: Denton Brick PHARMD [id number] 03/15/18 A BROWNING    Staphylococcus aureus (BCID) NOT DETECTED NOT DETECTED Final   Methicillin resistance NOT DETECTED NOT DETECTED Final   Streptococcus species NOT DETECTED NOT DETECTED Final   Streptococcus agalactiae NOT DETECTED NOT DETECTED Final   Streptococcus pneumoniae NOT DETECTED NOT DETECTED Final   Streptococcus pyogenes NOT DETECTED NOT DETECTED Final   Acinetobacter baumannii NOT DETECTED NOT DETECTED Final   Enterobacteriaceae species NOT DETECTED NOT DETECTED Final   Enterobacter cloacae complex NOT DETECTED NOT DETECTED Final   Escherichia coli NOT DETECTED NOT DETECTED Final   Klebsiella oxytoca NOT DETECTED NOT DETECTED Final   Klebsiella pneumoniae NOT DETECTED NOT DETECTED Final   Proteus species NOT DETECTED NOT DETECTED Final   Serratia marcescens NOT DETECTED NOT DETECTED Final   Haemophilus influenzae NOT DETECTED NOT DETECTED Final   Neisseria meningitidis NOT DETECTED NOT DETECTED Final   Pseudomonas aeruginosa NOT DETECTED NOT DETECTED Final   Candida albicans NOT DETECTED NOT DETECTED Final   Candida glabrata NOT DETECTED NOT DETECTED Final   Candida krusei NOT DETECTED NOT DETECTED Final   Candida parapsilosis NOT DETECTED NOT DETECTED Final   Candida tropicalis NOT DETECTED NOT DETECTED Final    Comment: Performed at [facility], [place]. [address] [place], [place] [address]    MRSA PCR Screening  Status: None   Collection Time: 03/14/18  2:39 PM  Result Value Ref Range Status   MRSA by PCR NEGATIVE NEGATIVE Final    Comment:        The GeneXpert MRSA Assay (FDA approved for NASAL specimens only), is one component of a comprehensive MRSA colonization surveillance program. It is not intended to diagnose MRSA infection nor to guide or monitor treatment for MRSA infections. Performed at [facility], [place] [address] [place], [place] [address]   Blood culture (routine x 2)     Status: None   Collection Time: 03/14/18  2:55 PM  Result Value Ref Range Status   Specimen Description BLOOD RIGHT ANTECUBITAL  Final   Special Requests AEROBIC BOTTLE ONLY Blood Culture adequate volume  Final   Culture   Final    NO GROWTH 5 DAYS Performed at [facility], [place] [address], [place], [place] [address]    Report Status 03/19/2018 FINAL  Final  Culture, respiratory (non-expectorated)     Status: None   Collection Time: 03/15/18  7:38 AM  Result Value Ref Range Status   Specimen Description TRACHEAL ASPIRATE  Final   Special Requests NONE  Final   Gram Stain   Final    ABUNDANT WBC PRESENT, PREDOMINANTLY PMN ABUNDANT GRAM POSITIVE COCCI Performed at [facility], [place] [address] [place], [place] [address]    Culture MODERATE Consistent with normal respiratory flora.  Final   Report Status 03/17/2018 FINAL  Final         Radiology Studies: No results found.      Scheduled Meds: . calcium acetate  2,001 mg Oral TID WC  . Chlorhexidine Gluconate Cloth  6 each Topical Q0600  . feeding supplement (NEPRO CARB STEADY)  237 mL Oral BID BM  . heparin  5,000 Units Subcutaneous Q8H  . insulin aspart  0-5 Units Subcutaneous Q4H  . insulin aspart  3 Units Subcutaneous TID WC  . [START ON 03/21/2018] insulin glargine  12 Units Subcutaneous Daily  . isosorbide-hydrALAZINE  1 tablet Oral TID  . levETIRAcetam  250 mg Oral Q T,Th,Sat-1800  .  levETIRAcetam  500 mg Oral Q0600  . mouth rinse  15 mL Mouth Rinse BID  . multivitamin  1 tablet Oral QHS  . NIFEdipine  30 mg Oral BID   Continuous Infusions:   LOS: 6 days     Vernell Leep, MD, FACP, [facility]. [facility] Pager [phone]  If 7PM-7AM, please contact night-coverage [email or web address] Password [facility] 03/20/2018, 4:28 PM

## 2018-03-20 NOTE — Progress Notes (Addendum)
Inpatient Diabetes Program Recommendations  AACE/ADA: New Consensus Statement on Inpatient Glycemic Control (2015)  Target Ranges:  Prepandial:   less than 140 mg/dL      Peak postprandial:   less than 180 mg/dL (1-2 hours)      Critically ill patients:  140 - 180 mg/dL   Lab Results  Component Value Date   GLUCAP 325 (H) 03/20/2018   HGBA1C 9.1 (H) 02/13/2018    Review of Glycemic Control  Diabetes history: DM1 Outpatient Diabetes medications: Lantus 25 units QHS, Novolog "approximately 3 units with meals" Current orders for Inpatient glycemic control: Lantus 10 units QD, Novolog 0-9 units Q4H + 3 units tidwc  CBGs 89-373 mg/dL today.  Spoke with pt regarding his home meds and HgbA1C of 9.1%. Pt states he sees Dr. Dagmar Hait for his endo. States he sees him on a regular basis, although later in the conversation, pt stated he had not seen endo in awhile. Questioned several times about how much Lantus and Novolog pt takes at home. Continued to say 25 units of Lantus and "takes Novolog up to 3 units with meals."  Pt stated he checks blood sugars "all the time." When asked for results of blood sugars, pt states 40-80 mg/dL. When questioned about hypoglycemia, he states occasionally he has low blood sugars. Pt states he doesn/t have any problems getting his insulin or supplies.  Inpatient Diabetes Program Recommendations:   (for discharge)  Lantus 12 units QD Novolog 2-3 units tidwc and hs. Novolog custom correction scale:  CBG <70 - implement hypoglycemia protocol 70-120- 0 121-150 - 0 151-200 - 1 201-250 - 2 251-300 - 3 301-350 - 4 351-400 - 5 >400- call MD  Pt instructed to monitor blood sugars at least 4x/day and f/u with Dr. Buddy Duty for post-hospitalization visit.  Will follow.  Thank you. Lorenda Peck, RD, LDN, CDE Inpatient Diabetes Coordinator [phone]  Addendum:  Per Dr. Algis Liming - recommendations above have been ordered. RV

## 2018-03-20 NOTE — Progress Notes (Signed)
Nutrition Follow-up  DOCUMENTATION CODES:   Not applicable  INTERVENTION:   Nepro Shake po BID, each supplement provides 425 kcal and 19 grams protein; insulin will need to be give with Nepro supplement  Continue Rena-Vit  Recommend liberalizing diet to Renal with Fluid Restriction only; pt is brittle Type I DM diabetic with hypoglycemic episodes   NUTRITION DIAGNOSIS:   Inadequate oral intake related to acute illness as evidenced by NPO status.  Being addressed as diet advanced, supplemebts  GOAL:   Patient will meet greater than or equal to 90% of their needs  Progressing   MONITOR:   PO intake, Supplement acceptance, Labs, Weight trends  REASON FOR ASSESSMENT:   Ventilator    ASSESSMENT:   38 y.o male with PMH: Type 1 diabetes mellitus, ESRD on HD, diabetic peripheral neuropathy and history of hypoglycemic seizures. Admitted with hypoglycemic seizure and acute respiratory failure requiring intubatation and sedation.    1/08 Intubated, Admitted 1/10 Extubated  Recorded po intake 75-100% of meals  Noted episodes with hypoglycemia; diabetes coordinator consulted and insulin regimen adjusted  PhosLo restarted, current phosphorus 5.8  EDW 88.5; current wt 88.4 kg.   Labs: sodium 134, potassium 5.4, phosphorus 5.8; CBGs 58-410 Meds: PhosLo, Rena-Vit   Diet Order:   Diet Order            Diet renal/carb modified with fluid restriction Diet-HS Snack? Nothing; Fluid restriction: 1200 mL Fluid; Room service appropriate? Yes; Fluid consistency: Thin  Diet effective now              EDUCATION NEEDS:   Not appropriate for education at this time  Skin:  Skin Assessment: Reviewed RN Assessment  Last BM:  1/9- Type 6  Height:   Ht Readings from Last 1 Encounters:  03/15/18 6' (1.829 m)    Weight:   Wt Readings from Last 1 Encounters:  03/20/18 88.4 kg    Ideal Body Weight:  72.7 kg  BMI:  Body mass index is 26.43 kg/m.  Estimated  Nutritional Needs:   Kcal:  2300-2500 kcals   Protein:  115-130 g   Fluid:  1,000 mL +UOP   Kerman Passey MS, RD, LDN, CNSC [phone] Pager  [phone] Weekend/On-Call Pager

## 2018-03-20 NOTE — Progress Notes (Signed)
[facility] Progress Note   Subjective:   Seen and examined at bedside while in HD.  No new complaints.  Low blood sugar noted overnight, 58 and elevated this AM, 373.    Objective Vitals:   03/20/18 0746 03/20/18 0803 03/20/18 0830 03/20/18 0900  BP: (!) 159/83 (!) 166/82 (!) 161/91 (!) 165/85  Pulse: 77 76 77 76  Resp: 16     Temp: 98.2 F (36.8 C)     TempSrc: Oral     SpO2: 96%     Weight: 88.4 kg     Height:       Physical Exam General:NAD, well appearing male, laying in bed Heart:RRR, no mrg Lungs:CTAB anteriorly  Abdomen:soft, NTND Extremities:no LE edema Dialysis Access: LU AVF cannulated     Filed Weights   03/19/18 2012 03/20/18 0348 03/20/18 0746  Weight: 91.1 kg 91.1 kg 88.4 kg    Intake/Output Summary (Last 24 hours) at 03/20/2018 0927 Last data filed at 03/20/2018 0905 Gross per 24 hour  Intake 840 ml  Output 0 ml  Net 840 ml    Additional Objective Labs: Basic Metabolic Panel: Recent Labs  Lab 03/16/18 0551 03/16/18 1711 03/17/18 0717 03/20/18 0813  NA 133*  --  137 134*  K 4.0  --  3.9 5.4*  CL 93*  --  95* 96*  CO2 24  --  24 22  GLUCOSE 242*  --  189* 373*  BUN 27*  --  39* 61*  CREATININE 9.66*  --  12.94* 13.92*  CALCIUM 8.1*  --  8.2* 8.6*  PHOS 5.1* 5.0* 6.4* 5.8*   Liver Function Tests: Recent Labs  Lab 03/14/18 0827 03/20/18 0813  AST 41  --   ALT 22  --   ALKPHOS 66  --   BILITOT 0.9  --   PROT 7.7  --   ALBUMIN 3.6 3.0*   CBC: Recent Labs  Lab 03/14/18 0827  03/15/18 0809 03/16/18 0551 03/17/18 0717 03/20/18 0813  WBC 4.5  --  6.3 7.1 6.5 4.2  NEUTROABS 3.3  --   --   --   --   --   HGB 12.8*   < > 11.5* 10.8* 11.8* 11.3*  HCT 41.6   < > 36.9* 34.0* 37.3* 34.1*  MCV 95.0  --  90.7 89.0 89.9 88.6  PLT 313  --  229 219 208 308   < > = values in this interval not displayed.   Blood Culture    Component Value Date/Time   SDES TRACHEAL ASPIRATE 03/15/2018 0738   SPECREQUEST NONE 03/15/2018  0738   CULT MODERATE Consistent with normal respiratory flora. 03/15/2018 0738   REPTSTATUS 03/17/2018 FINAL 03/15/2018 0738    CBG: Recent Labs  Lab 03/19/18 1630 03/19/18 2010 03/20/18 0008 03/20/18 0414 03/20/18 0440  GLUCAP 405* 217* 143* 58* 89    Medications:  . calcium acetate  2,001 mg Oral TID WC  . Chlorhexidine Gluconate Cloth  6 each Topical Q0600  . heparin  5,000 Units Subcutaneous Q8H  . insulin aspart  0-9 Units Subcutaneous Q4H  . insulin aspart  3 Units Subcutaneous TID WC  . insulin glargine  10 Units Subcutaneous Daily  . isosorbide-hydrALAZINE  1 tablet Oral TID  . levETIRAcetam  250 mg Oral Q T,Th,Sat-1800  . levETIRAcetam  500 mg Oral Q0600  . mouth rinse  15 mL Mouth Rinse BID  . multivitamin  1 tablet Oral QHS  . NIFEdipine  30 mg  Oral BID    Dialysis Orders: TTS SW 4h 54min 500/800 88.5kg 2/2.25 LUA AVF Hep 5000 Sensipar 90 venofer 50/wk Mircera 50 every 2 wks, last 12/28  Assessment/Plan: 1.T1DM, uncontrolled w/Hypoglycemia - found down at home, now improved. Per primary 2. Acute encephalopathy/seizure - 2/2 hypoglycemia & non compliance with meds, improving 3. Acute respiratory failure - self extubated, breathing stable 4. ESRD -on HD TTS. K 5.4. HD today per regular schedule.  5. Anemia of CKD-Hgb 11.3. No indication for ESA at this time.  6. Secondary hyperparathyroidism -Ca 8.6, Phos 5.8, continue sensipar, binders. Not on VDRA. 7. HTN/volume -BP elevated today.  Expect improvement with HD.  Does not appear volume overloaded on exam.  8. Nutrition -Renal diet w/fluid restrictions, renavite.  Jen Mow, PA-C [facility] Pager: [phone] 03/20/2018,9:27 AM  LOS: 6 days

## 2018-03-20 NOTE — Progress Notes (Signed)
CBG: 408 MD paged, waiting on further instructions for insulin.   Will continue to monitor.   Farley Ly RN

## 2018-03-20 NOTE — Progress Notes (Signed)
Hypoglycemic Event  CBG: 58  Treatment:Given 4oz of juice  Symptoms: None, routine check   Follow-up CBG: Time:0440 CBG Result:89  Possible Reasons for Event: 1 Unit of Novolog given for CBG-143  Comments/MD notified:NP K. Sandi Mariscal Isac Sarna

## 2018-03-21 DIAGNOSIS — H5440 Blindness, one eye, unspecified eye: Secondary | ICD-10-CM

## 2018-03-21 LAB — BASIC METABOLIC PANEL
Anion gap: 15 (ref 5–15)
BUN: 57 mg/dL — ABNORMAL HIGH (ref 6–20)
CHLORIDE: 91 mmol/L — AB (ref 98–111)
CO2: 24 mmol/L (ref 22–32)
Calcium: 8.6 mg/dL — ABNORMAL LOW (ref 8.9–10.3)
Creatinine, Ser: 11.23 mg/dL — ABNORMAL HIGH (ref 0.61–1.24)
GFR calc Af Amer: 6 mL/min — ABNORMAL LOW (ref >60)
GFR calc non Af Amer: 5 mL/min — ABNORMAL LOW (ref >60)
Glucose, Bld: 590 mg/dL (ref 70–99)
POTASSIUM: 5.3 mmol/L — AB (ref 3.5–5.1)
Sodium: 130 mmol/L — ABNORMAL LOW (ref 135–145)

## 2018-03-21 LAB — GLUCOSE, CAPILLARY
GLUCOSE-CAPILLARY: 141 mg/dL — AB (ref 70–99)
Glucose-Capillary: 214 mg/dL — ABNORMAL HIGH (ref 70–99)
Glucose-Capillary: 312 mg/dL — ABNORMAL HIGH (ref 70–99)
Glucose-Capillary: 369 mg/dL — ABNORMAL HIGH (ref 70–99)
Glucose-Capillary: 447 mg/dL — ABNORMAL HIGH (ref 70–99)
Glucose-Capillary: 487 mg/dL — ABNORMAL HIGH (ref 70–99)
Glucose-Capillary: 524 mg/dL (ref 70–99)
Glucose-Capillary: 549 mg/dL (ref 70–99)
Glucose-Capillary: >600 mg/dL (ref 70–99)

## 2018-03-21 MED ORDER — INSULIN ASPART 100 UNIT/ML ~~LOC~~ SOLN
4.0000 [IU] | Freq: Once | SUBCUTANEOUS | Status: AC
  Administered 2018-03-21: 4 [IU] via SUBCUTANEOUS

## 2018-03-21 MED ORDER — CINACALCET HCL 30 MG PO TABS
90.0000 mg | ORAL_TABLET | ORAL | Status: DC
  Administered 2018-03-22 – 2018-03-27 (×3): 90 mg via ORAL
  Filled 2018-03-21 (×4): qty 3

## 2018-03-21 MED ORDER — INSULIN ASPART 100 UNIT/ML ~~LOC~~ SOLN
5.0000 [IU] | Freq: Three times a day (TID) | SUBCUTANEOUS | Status: DC
  Administered 2018-03-22 – 2018-03-28 (×17): 5 [IU] via SUBCUTANEOUS

## 2018-03-21 MED ORDER — INSULIN ASPART 100 UNIT/ML ~~LOC~~ SOLN
12.0000 [IU] | Freq: Once | SUBCUTANEOUS | Status: AC
  Administered 2018-03-21: 12 [IU] via SUBCUTANEOUS

## 2018-03-21 MED ORDER — INSULIN GLARGINE 100 UNIT/ML ~~LOC~~ SOLN
10.0000 [IU] | Freq: Every day | SUBCUTANEOUS | Status: DC
  Administered 2018-03-21 – 2018-03-26 (×6): 10 [IU] via SUBCUTANEOUS
  Filled 2018-03-21 (×7): qty 0.1

## 2018-03-21 MED ORDER — CHLORHEXIDINE GLUCONATE CLOTH 2 % EX PADS
6.0000 | MEDICATED_PAD | Freq: Every day | CUTANEOUS | Status: DC
  Administered 2018-03-21: 6 via TOPICAL

## 2018-03-21 MED ORDER — INSULIN ASPART 100 UNIT/ML ~~LOC~~ SOLN
0.0000 [IU] | Freq: Three times a day (TID) | SUBCUTANEOUS | Status: DC
  Administered 2018-03-21: 15 [IU] via SUBCUTANEOUS
  Administered 2018-03-22: 3 [IU] via SUBCUTANEOUS
  Administered 2018-03-22: 15 [IU] via SUBCUTANEOUS
  Administered 2018-03-23: 5 [IU] via SUBCUTANEOUS
  Administered 2018-03-23: 8 [IU] via SUBCUTANEOUS
  Administered 2018-03-24: 5 [IU] via SUBCUTANEOUS

## 2018-03-21 NOTE — Progress Notes (Signed)
CBG: To high too read on CBG machine.  MD made aware. MD ordering BMP. Will continue to monitor.   Farley Ly RN

## 2018-03-21 NOTE — Progress Notes (Signed)
Pt current CBG-447. On call NP Tylene Fantasia, made aware. Will continue to monitor.

## 2018-03-21 NOTE — Progress Notes (Signed)
PROGRESS NOTE    [facility]  WIO:[id number] DOB: 07-Sep-1980 DOA: 03/14/2018 PCP: Nolene Ebbs, MD    Brief Narrative:  38 year old male who presented altered mentation and hypoglycemia.  He does have the significant past medical history of type 1 diabetes mellitus, end-stage renal disease on hemodialysis, hypertension and seizures.  Patient was found unresponsive at home, his capital glucose was down to 33, he had a witnessed generalized seizure.  Response to glucagon and dextrose, required invasive mechanical ventilation.  His blood pressure was 137/85, heart rate 78, temperature 93.8, respirate 18, oxygen saturation 100 percent on mechanical ventilation.  His lungs are clear to auscultation bilaterally, heart S1-S2 present and rhythmic, abdomen soft nontender, no lower extremity edema, no rashes.  Patient was admitted to the intensive care unit with a working diagnosis of metabolic encephalopathy due to severe hypoglycemia complicated by generalized seizures.  Patient was liberated from mechanical ventilation on January 10, transferred to triad hospitalist January 12.  Continue to have widely fluctuating capillary glucose measurements.   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  Metabolic encephalopathy due to hypoglycemia. Continue uncontrolled hyperglycemia, today capillary glucose up to 500, patient has basal insulin increased to 12 units, at home on 24 units. Will continue pre meal insulin and will increase insulin sliding scale to moderate intensity. Will target glucose level of 160 to 180 before discharge.   2.  Generalized seizures. No further events, will continue neuro checks per unit protocol, out of bed as tolerated and ambulation in the hallway. Continue Keppra.   3.  End-stage renal disease on  hemodialysis. Patient clinically euvolemic, will continue HD per nephrology recommendations. Continue calcium acetate and sensipar for metabolic bone disease.   4. Diabetic neuropathy. Continue symptomatic control.   5. HTN. Continue nifedipine, isosorbide and hydralazine for blood pressure control.   DVT prophylaxis: heparin   Code Status: full Family Communication: I spoke with patient's family at the bedside and all questions were addressed.  Disposition Plan/ discharge barriers: Pending stabilization of serum glucose.   Body mass index is 26.28 kg/m. Malnutrition Type:  Nutrition Problem: Inadequate oral intake Etiology: acute illness   Malnutrition Characteristics:  Signs/Symptoms: NPO status   Nutrition Interventions:  Interventions: MVI, Nepro shake  RN Pressure Injury Documentation:     Consultants:     Procedures:     Antimicrobials:       Subjective: Patient feeling deconditioned, has been tolerating po well, no nausea or vomiting, not much ambulating.   Objective: Vitals:   03/20/18 1803 03/20/18 2022 03/21/18 0500 03/21/18 0501  BP: 140/81 (!) 159/84  (!) 172/94  Pulse: 89 84  73  Resp: 18 17  20   Temp: 98.5 F (36.9 C) 98.8 F (37.1 C)  97.8 F (36.6 C)  TempSrc: Oral Oral  Oral  SpO2: 100% 100%  99%  Weight:  87.9 kg 87.9 kg   Height:        Intake/Output Summary (Last 24 hours) at 03/21/2018 0833 Last data filed at 03/21/2018 0501 Gross per 24 hour  Intake 426 ml  Output 3000 ml  Net -2574 ml   Filed Weights   03/20/18 1203 03/20/18 2022 03/21/18 0500  Weight: 87.9 kg 87.9 kg 87.9 kg    Examination:   General: deconditioned  Neurology:  Awake and alert, non focal  E ENT: no pallor, no icterus, oral mucosa moist Cardiovascular: No JVD. S1-S2 present, rhythmic, no gallops, rubs, or murmurs. No lower extremity edema. Pulmonary: vesicular breath sounds bilaterally, adequate air movement, no wheezing, rhonchi or  rales. Gastrointestinal. Abdomen with no organomegaly, non tender, no rebound or guarding Skin. No rashes Musculoskeletal: no joint deformities Access: Left upper ext. Fistula.     Data Reviewed: I have personally reviewed following labs and imaging studies  CBC: Recent Labs  Lab 03/14/18 1100 03/15/18 0809 03/16/18 0551 03/17/18 0717 03/20/18 0813  WBC  --  6.3 7.1 6.5 4.2  HGB 12.2* 11.5* 10.8* 11.8* 11.3*  HCT 36.0* 36.9* 34.0* 37.3* 34.1*  MCV  --  90.7 89.0 89.9 88.6  PLT  --  229 219 208 818   Basic Metabolic Panel: Recent Labs  Lab 03/14/18 1100  03/15/18 0809 03/15/18 1813 03/15/18 2306 03/16/18 0551 03/16/18 1711 03/17/18 0717 03/20/18 0813  NA 138  --  131*  --   --  133*  --  137 134*  K 4.3  --  5.5*  --   --  4.0  --  3.9 5.4*  CL 99  --  96*  --   --  93*  --  95* 96*  CO2  --   --  23  --   --  24  --  24 22  GLUCOSE 345*  --  333*  --   --  242*  --  189* 373*  BUN 30*  --  41*  --   --  27*  --  39* 61*  CREATININE 10.90*  --  12.98*  --   --  9.66*  --  12.94* 13.92*  CALCIUM  --   --  8.3*  --   --  8.1*  --  8.2* 8.6*  MG  --    < > 2.1 1.8 2.1 2.1 2.3 2.4  --   PHOS  --    < > 1.8* 4.2  --  5.1* 5.0* 6.4* 5.8*   < > = values in this interval not displayed.   GFR: Estimated Creatinine Clearance: 8.1 mL/min (A) (by C-G formula based on SCr of 13.92 mg/dL (H)). Liver Function Tests: Recent Labs  Lab 03/20/18 0813  ALBUMIN 3.0*   No results for input(s): LIPASE, AMYLASE in the last 168 hours. No results for input(s): AMMONIA in the last 168 hours. Coagulation Profile: Recent Labs  Lab 03/14/18 1455  INR 1.00   Cardiac Enzymes: No results for input(s): CKTOTAL, CKMB, CKMBINDEX, TROPONINI in the last 168 hours. BNP (last 3 results) No results for input(s): PROBNP in the last 8760 hours. HbA1C: No results for input(s): HGBA1C in the last 72 hours. CBG: Recent Labs  Lab 03/20/18 1612 03/20/18 2017 03/21/18 0019 03/21/18 0355  03/21/18 0741  GLUCAP 408* 374* 369* 214* 141*   Lipid Profile: No results for input(s): CHOL, HDL, LDLCALC, TRIG, CHOLHDL, LDLDIRECT in the last 72 hours. Thyroid Function Tests: No results for input(s): TSH, T4TOTAL, FREET4, T3FREE, THYROIDAB in the last 72 hours. Anemia Panel: No results for input(s): VITAMINB12, FOLATE, FERRITIN, TIBC, IRON, RETICCTPCT in the last 72 hours.    Radiology Studies: I have reviewed all of the imaging during this hospital visit personally     Scheduled Meds: . calcium acetate  2,001 mg Oral TID WC  . Chlorhexidine Gluconate Cloth  6 each Topical Q0600  . feeding supplement (NEPRO CARB STEADY)  237 mL Oral BID BM  . heparin  5,000 Units Subcutaneous Q8H  . insulin aspart  0-5 Units Subcutaneous Q4H  . insulin aspart  3 Units Subcutaneous TID WC  . insulin glargine  12 Units Subcutaneous Daily  . isosorbide-hydrALAZINE  1 tablet Oral TID  . levETIRAcetam  250 mg Oral Q T,Th,Sat-1800  . levETIRAcetam  500 mg Oral Q0600  . mouth rinse  15 mL Mouth Rinse BID  . multivitamin  1 tablet Oral QHS  . NIFEdipine  30 mg Oral BID   Continuous Infusions:   LOS: 7 days         Gerome Apley, MD [facility] Pager [phone]

## 2018-03-21 NOTE — Progress Notes (Addendum)
[facility] Progress Note   Subjective:   Seen and examined at bedside.  Sitting up in bed, just finished breakfast.  No new complaints. Feeling well.  Reports BS has been high. Withdrawn today- as he often is as OP   Objective Vitals:   03/20/18 1803 03/20/18 2022 03/21/18 0500 03/21/18 0501  BP: 140/81 (!) 159/84  (!) 172/94  Pulse: 89 84  73  Resp: 18 17  20   Temp: 98.5 F (36.9 C) 98.8 F (37.1 C)  97.8 F (36.6 C)  TempSrc: Oral Oral  Oral  SpO2: 100% 100%  99%  Weight:  87.9 kg 87.9 kg   Height:       Physical Exam General:NAD, well appearing male, sitting in bed Heart:RRR, no mrg Lungs:CTAB, no wheeze, rales or rhonchi Abdomen:soft, NTND Extremities:no LE edema Dialysis Access: LU AVF +b/t   Filed Weights   03/20/18 1203 03/20/18 2022 03/21/18 0500  Weight: 87.9 kg 87.9 kg 87.9 kg    Intake/Output Summary (Last 24 hours) at 03/21/2018 0912 Last data filed at 03/21/2018 0900 Gross per 24 hour  Intake 666 ml  Output 3000 ml  Net -2334 ml    Additional Objective Labs: Basic Metabolic Panel: Recent Labs  Lab 03/16/18 0551 03/16/18 1711 03/17/18 0717 03/20/18 0813  NA 133*  --  137 134*  K 4.0  --  3.9 5.4*  CL 93*  --  95* 96*  CO2 24  --  24 22  GLUCOSE 242*  --  189* 373*  BUN 27*  --  39* 61*  CREATININE 9.66*  --  12.94* 13.92*  CALCIUM 8.1*  --  8.2* 8.6*  PHOS 5.1* 5.0* 6.4* 5.8*   Liver Function Tests: Recent Labs  Lab 03/20/18 0813  ALBUMIN 3.0*   CBC: Recent Labs  Lab 03/15/18 0809 03/16/18 0551 03/17/18 0717 03/20/18 0813  WBC 6.3 7.1 6.5 4.2  HGB 11.5* 10.8* 11.8* 11.3*  HCT 36.9* 34.0* 37.3* 34.1*  MCV 90.7 89.0 89.9 88.6  PLT 229 219 208 308   Blood Culture    Component Value Date/Time   SDES TRACHEAL ASPIRATE 03/15/2018 0738   SPECREQUEST NONE 03/15/2018 0738   CULT MODERATE Consistent with normal respiratory flora. 03/15/2018 0738   REPTSTATUS 03/17/2018 FINAL 03/15/2018 0738    CBG: Recent Labs   Lab 03/20/18 1612 03/20/18 2017 03/21/18 0019 03/21/18 0355 03/21/18 0741  GLUCAP 408* 374* 369* 214* 141*    Medications:  . calcium acetate  2,001 mg Oral TID WC  . Chlorhexidine Gluconate Cloth  6 each Topical Q0600  . feeding supplement (NEPRO CARB STEADY)  237 mL Oral BID BM  . heparin  5,000 Units Subcutaneous Q8H  . insulin aspart  0-5 Units Subcutaneous Q4H  . insulin aspart  3 Units Subcutaneous TID WC  . insulin glargine  12 Units Subcutaneous Daily  . isosorbide-hydrALAZINE  1 tablet Oral TID  . levETIRAcetam  250 mg Oral Q T,Th,Sat-1800  . levETIRAcetam  500 mg Oral Q0600  . mouth rinse  15 mL Mouth Rinse BID  . multivitamin  1 tablet Oral QHS  . NIFEdipine  30 mg Oral BID    Dialysis Orders: TTS SW 4h 26min 500/800 88.5kg 2/2.25 LUA AVF Hep 5000 Sensipar 90 venofer 50/wk Mircera 50 every 2 wks, last 12/28  Assessment/Plan: 1.T1DM, uncontrolled w/Hypoglycemia - found down at home, now improved. Per primary- some issues 2. Acute encephalopathy/seizure - 2/2 hypoglycemia & non compliance with meds, improving 3. Acute respiratory  failure - Improved. Self extubated, breathing stable 4. ESRD -on HD TTS. K 5.4 pre HD yesterday. Orders written for HD tomorrow per regular schedule w/labs prior. Will order sensipar post treatment 5. Anemia of CKD-Hgb 11.3. No indication for ESA at this time.  6. Secondary hyperparathyroidism -Ca 8.6, Phos 5.8, continue sensipar, binders. Not on VDRA.  Reinforced binder compliance. 7. HTN/volume -BP improved with HD and elevated again today.  Does not appear volume overloaded on exam. Per weights under dry post D yesterday.  May have lost weight, continue to titrate down as tolerated.  Will reassess EDW at d/c. Not on consistent reg as OP- I told him that the bidil and procardia is controlling - will try to keep on that for now 8. Nutrition -Renal/Carb modifed diet w/fluid restrictions, renavite. Protein  supplements.  Jen Mow, PA-C [facility] Pager: [phone] 03/21/2018,9:12 AM  LOS: 7 days    Patient seen and examined, agree with above note with above modifications. Known to me as OP- seems depressed but denies- just frustrated about his medical issues.  Re BP- is better than it has been as OP- agrees to stay on bidil and procardia for now and challenge EDW- re sugar- is big issue for him- being managed by primary team - routine HD tomorrow  Corliss Parish, MD 03/21/2018

## 2018-03-22 LAB — RENAL FUNCTION PANEL
ALBUMIN: 3.1 g/dL — AB (ref 3.5–5.0)
Anion gap: 15 (ref 5–15)
BUN: 71 mg/dL — ABNORMAL HIGH (ref 6–20)
CO2: 24 mmol/L (ref 22–32)
Calcium: 9.1 mg/dL (ref 8.9–10.3)
Chloride: 97 mmol/L — ABNORMAL LOW (ref 98–111)
Creatinine, Ser: 12.5 mg/dL — ABNORMAL HIGH (ref 0.61–1.24)
GFR calc Af Amer: 5 mL/min — ABNORMAL LOW (ref >60)
GFR calc non Af Amer: 5 mL/min — ABNORMAL LOW (ref >60)
Glucose, Bld: 210 mg/dL — ABNORMAL HIGH (ref 70–99)
PHOSPHORUS: 5 mg/dL — AB (ref 2.5–4.6)
Potassium: 4.6 mmol/L (ref 3.5–5.1)
SODIUM: 136 mmol/L (ref 135–145)

## 2018-03-22 LAB — PROINSULIN/INSULIN RATIO
Insulin: 9.3 u[IU]/mL
Proinsulin/Insulin Ratio: <2 %

## 2018-03-22 LAB — GLUCOSE, CAPILLARY
Glucose-Capillary: 188 mg/dL — ABNORMAL HIGH (ref 70–99)
Glucose-Capillary: 231 mg/dL — ABNORMAL HIGH (ref 70–99)
Glucose-Capillary: 333 mg/dL — ABNORMAL HIGH (ref 70–99)
Glucose-Capillary: 425 mg/dL — ABNORMAL HIGH (ref 70–99)
Glucose-Capillary: 91 mg/dL (ref 70–99)

## 2018-03-22 LAB — CBC
HCT: 42.7 % (ref 39.0–52.0)
HEMOGLOBIN: 14 g/dL (ref 13.0–17.0)
MCH: 28.1 pg (ref 26.0–34.0)
MCHC: 32.8 g/dL (ref 30.0–36.0)
MCV: 85.7 fL (ref 80.0–100.0)
Platelets: 238 10*3/uL (ref 150–400)
RBC: 4.98 MIL/uL (ref 4.22–5.81)
RDW: 12.7 % (ref 11.5–15.5)
WBC: 2.3 10*3/uL — ABNORMAL LOW (ref 4.0–10.5)
nRBC: 0 % (ref 0.0–0.2)

## 2018-03-22 NOTE — Progress Notes (Signed)
[facility] Progress Note   Subjective:   Seen on HD-  "why am I still here ?  I have already been here today ?"  Angry - sugars have been high- 400-500   Objective Vitals:   03/22/18 0715 03/22/18 0800 03/22/18 0830 03/22/18 0900  BP: (!) 160/88 (!) 164/97 (!) 142/91 (!) (P) 146/91  Pulse: 72 78 78 (P) 74  Resp: 17 18 17  (P) 18  Temp:      TempSrc:      SpO2:      Weight:      Height:       Physical Exam General:NAD, well appearing male, angry at present Heart:RRR, no mrg Lungs:CTAB, no wheeze, rales or rhonchi Abdomen:soft, NTND Extremities:no LE edema Dialysis Access: LU AVF +b/t - accessed   [facility] Weights   03/20/18 2022 03/21/18 0500 03/22/18 0650  Weight: 87.9 kg 87.9 kg 90.4 kg    Intake/Output Summary (Last 24 hours) at 03/22/2018 1034 Last data filed at 03/22/2018 0601 Gross per 24 hour  Intake 0 ml  Output -  Net 0 ml    Additional Objective Labs: Basic Metabolic Panel: Recent Labs  Lab 03/17/18 0717 03/20/18 0813 03/21/18 1449 03/22/18 0627  NA 137 134* 130* 136  K 3.9 5.4* 5.3* 4.6  CL 95* 96* 91* 97*  CO2 24 22 24 24   GLUCOSE 189* 373* 590* 210*  BUN 39* 61* 57* 71*  CREATININE 12.94* 13.92* 11.23* 12.50*  CALCIUM 8.2* 8.6* 8.6* 9.1  PHOS 6.4* 5.8*  --  5.0*   Liver Function Tests: Recent Labs  Lab 03/20/18 0813 03/22/18 0627  ALBUMIN 3.0* 3.1*   CBC: Recent Labs  Lab 03/16/18 0551 03/17/18 0717 03/20/18 0813 03/22/18 0627  WBC 7.1 6.5 4.2 2.3*  HGB 10.8* 11.8* 11.3* 14.0  HCT 34.0* 37.3* 34.1* 42.7  MCV 89.0 89.9 88.6 85.7  PLT 219 208 308 238   Blood Culture    Component Value Date/Time   SDES TRACHEAL ASPIRATE 03/15/2018 0738   SPECREQUEST NONE 03/15/2018 0738   CULT MODERATE Consistent with normal respiratory flora. 03/15/2018 0738   REPTSTATUS 03/17/2018 FINAL 03/15/2018 0738    CBG: Recent Labs  Lab 03/21/18 1616 03/21/18 1758 03/21/18 2036 03/22/18 0007 03/22/18 0437  GLUCAP 524* 487* 447*  333* 231*    Medications:  . calcium acetate  2,001 mg Oral TID WC  . Chlorhexidine Gluconate Cloth  6 each Topical Q0600  . cinacalcet  90 mg Oral Q T,Th,Sa-HD  . feeding supplement (NEPRO CARB STEADY)  237 mL Oral BID BM  . heparin  5,000 Units Subcutaneous Q8H  . insulin aspart  0-15 Units Subcutaneous TID WC  . insulin aspart  5 Units Subcutaneous TID WC  . insulin glargine  10 Units Subcutaneous QHS  . insulin glargine  12 Units Subcutaneous Daily  . isosorbide-hydrALAZINE  1 tablet Oral TID  . levETIRAcetam  250 mg Oral Q T,Th,Sat-1800  . levETIRAcetam  500 mg Oral Q0600  . mouth rinse  15 mL Mouth Rinse BID  . multivitamin  1 tablet Oral QHS  . NIFEdipine  30 mg Oral BID    Dialysis Orders: TTS SW 4h 52min 500/800 88.5kg 2/2.25 LUA AVF Hep 5000 Sensipar 90 venofer 50/wk Mircera 50 every 2 wks, last 12/28  Assessment/Plan: 1.T1DM, uncontrolled w/Hypoglycemia - found down at home, now improved. Per primary- some issues, now hyperglycemia  2. Acute encephalopathy/seizure - 2/2 hypoglycemia & non compliance with meds, improving but seems paranoid or  something today  3. Acute respiratory failure - Improved. Self extubated, breathing stable 4. ESRD -on HD TTS. Doing routine HD while here.  Will order sensipar post treatment 5. Anemia of CKD-Hgb 11.3- now higher ? Marland Kitchen No indication for ESA at this time.  6. Secondary hyperparathyroidism -Ca 8.6, Phos 5.8, continue sensipar, binders. Not on VDRA.  Reinforced binder compliance. 7. HTN/volume -BP overall  improved .  Does not appear volume overloaded on exam. under dry post .  May have lost weight, continue to titrate down as tolerated.  Will reassess EDW at d/c. Not on consistent reg as OP- I told him that the bidil and procardia is controlling - will try to keep on that for now 8. Nutrition -Renal/Carb modifed diet w/fluid restrictions, renavite. Protein supplements. 9. Dispo- sounding today like he wants to leave,  might leave AMA ?    Corliss Parish, MD 03/22/2018

## 2018-03-22 NOTE — Procedures (Signed)
Patient was seen on dialysis and the procedure was supervised.  BFR 400  Via AVF BP is  152/81.   Patient appears to be tolerating treatment well- signed off early   Louis Meckel 03/22/2018

## 2018-03-22 NOTE — Progress Notes (Signed)
PROGRESS NOTE    Edward Colon  IWL:[id number] DOB: 11-18-1980 DOA: 03/14/2018 PCP: Nolene Ebbs, MD    Brief Narrative:  38 year old male who presented altered mentation and hypoglycemia.  He does have the significant past medical history of type 1 diabetes mellitus, end-stage renal disease on hemodialysis, hypertension and seizures.  Patient was found unresponsive at home, his capital glucose was down to 33, he had a witnessed generalized seizure.  Response to glucagon and dextrose, required invasive mechanical ventilation.  His blood pressure was 137/85, heart rate 78, temperature 93.8, respirate 18, oxygen saturation 100 percent on mechanical ventilation.  His lungs are clear to auscultation bilaterally, heart S1-S2 present and rhythmic, abdomen soft nontender, no lower extremity edema, no rashes.  Patient was admitted to the intensive care unit with a working diagnosis of metabolic encephalopathy due to severe hypoglycemia complicated by generalized seizures.  Patient was liberated from mechanical ventilation on January 10, transferred to triad hospitalist January 12.  Continue to have widely fluctuating capillary glucose measurements   Assessment & Plan:   Active Problems:   ESRD on dialysis [facility])   Altered mental status   Diabetic neuropathy (HCC)   Blindness of left eye with normal vision in contralateral eye   Agitation   Encephalopathy   1.  Metabolic encephalopathy due to hypoglycemia. Patient with persistent hyperglycemia, added insulin glargine 10 units q pm with fasting glucose 210 mg/dl. Will contiue glucose monitoring q 4 H, insulin sliding scale upgraded to moderate intensity, patient continue tolerating po well. Continue pre-meal insulin 5 units aspart.   2.  Generalized seizures. Continue Keppra. Out of bed as tolerated.    3.  End-stage renal disease on hemodialysis. .On calcium acetate and sensipar for metabolic bone disease. Tolerated well HD today.    4. Diabetic neuropathy. Stable with no exacerbation.   5. HTN. Continue blood pressure control with nifedipine, isosorbide and hydralazine.    Body mass index is 26.37 kg/m. Malnutrition Type:  Nutrition Problem: Inadequate oral intake Etiology: acute illness   Malnutrition Characteristics:  Signs/Symptoms: NPO status   Nutrition Interventions:  Interventions: MVI, Nepro shake  RN Pressure Injury Documentation:     Consultants:     Procedures:     Antimicrobials:       Subjective: Patient had severe hyperglycemia yesterday, had insulin dose increased. This am with no nausea or chest pain, tolerated well HD.   Objective: Vitals:   03/22/18 1000 03/22/18 1030 03/22/18 1100 03/22/18 1147  BP: (!) 156/89 (!) 155/82 (!) 152/81 (!) 155/89  Pulse: 76 75 79 78  Resp: 18 17 18 18   Temp:   (!) 97.5 F (36.4 C) 98.1 F (36.7 C)  TempSrc:   Oral Oral  SpO2:   98% 100%  Weight:   88.2 kg   Height:        Intake/Output Summary (Last 24 hours) at 03/22/2018 1517 Last data filed at 03/22/2018 1245 Gross per 24 hour  Intake 240 ml  Output 2210 ml  Net -1970 ml   Filed Weights   03/21/18 0500 03/22/18 0650 03/22/18 1100  Weight: 87.9 kg 90.4 kg 88.2 kg    Examination:   General: deconditioned  Neurology: Awake and alert, non focal  E ENT: mild pallor, no icterus, oral mucosa moist Cardiovascular: No JVD. S1-S2 present, rhythmic, no gallops, rubs, or murmurs. No lower extremity edema. Pulmonary: decreased breath sounds bilaterally, poor air movement, no wheezing, rhonchi or rales. Gastrointestinal. Abdomen with no organomegaly, non tender, no rebound  or guarding Skin. No rashes Musculoskeletal: no joint deformities     Data Reviewed: I have personally reviewed following labs and imaging studies  CBC: Recent Labs  Lab 03/16/18 0551 03/17/18 0717 03/20/18 0813 03/22/18 0627  WBC 7.1 6.5 4.2 2.3*  HGB 10.8* 11.8* 11.3* 14.0  HCT 34.0* 37.3*  34.1* 42.7  MCV 89.0 89.9 88.6 85.7  PLT 219 208 308 263   Basic Metabolic Panel: Recent Labs  Lab 03/15/18 1813 03/15/18 2306 03/16/18 0551 03/16/18 1711 03/17/18 0717 03/20/18 0813 03/21/18 1449 03/22/18 0627  NA  --   --  133*  --  137 134* 130* 136  K  --   --  4.0  --  3.9 5.4* 5.3* 4.6  CL  --   --  93*  --  95* 96* 91* 97*  CO2  --   --  24  --  24 22 24 24   GLUCOSE  --   --  242*  --  189* 373* 590* 210*  BUN  --   --  27*  --  39* 61* 57* 71*  CREATININE  --   --  9.66*  --  12.94* 13.92* 11.23* 12.50*  CALCIUM  --   --  8.1*  --  8.2* 8.6* 8.6* 9.1  MG 1.8 2.1 2.1 2.3 2.4  --   --   --   PHOS 4.2  --  5.1* 5.0* 6.4* 5.8*  --  5.0*   GFR: Estimated Creatinine Clearance: 9.1 mL/min (A) (by C-G formula based on SCr of 12.5 mg/dL (H)). Liver Function Tests: Recent Labs  Lab 03/20/18 0813 03/22/18 0627  ALBUMIN 3.0* 3.1*   No results for input(s): LIPASE, AMYLASE in the last 168 hours. No results for input(s): AMMONIA in the last 168 hours. Coagulation Profile: No results for input(s): INR, PROTIME in the last 168 hours. Cardiac Enzymes: No results for input(s): CKTOTAL, CKMB, CKMBINDEX, TROPONINI in the last 168 hours. BNP (last 3 results) No results for input(s): PROBNP in the last 8760 hours. HbA1C: No results for input(s): HGBA1C in the last 72 hours. CBG: Recent Labs  Lab 03/21/18 1758 03/21/18 2036 03/22/18 0007 03/22/18 0437 03/22/18 1145  GLUCAP 487* 447* 333* 231* 188*   Lipid Profile: No results for input(s): CHOL, HDL, LDLCALC, TRIG, CHOLHDL, LDLDIRECT in the last 72 hours. Thyroid Function Tests: No results for input(s): TSH, T4TOTAL, FREET4, T3FREE, THYROIDAB in the last 72 hours. Anemia Panel: No results for input(s): VITAMINB12, FOLATE, FERRITIN, TIBC, IRON, RETICCTPCT in the last 72 hours.    Radiology Studies: I have reviewed all of the imaging during this hospital visit personally     Scheduled Meds: . calcium acetate   2,001 mg Oral TID WC  . Chlorhexidine Gluconate Cloth  6 each Topical Q0600  . cinacalcet  90 mg Oral Q T,Th,Sa-HD  . feeding supplement (NEPRO CARB STEADY)  237 mL Oral BID BM  . heparin  5,000 Units Subcutaneous Q8H  . insulin aspart  0-15 Units Subcutaneous TID WC  . insulin aspart  5 Units Subcutaneous TID WC  . insulin glargine  10 Units Subcutaneous QHS  . insulin glargine  12 Units Subcutaneous Daily  . isosorbide-hydrALAZINE  1 tablet Oral TID  . levETIRAcetam  250 mg Oral Q T,Th,Sat-1800  . levETIRAcetam  500 mg Oral Q0600  . mouth rinse  15 mL Mouth Rinse BID  . multivitamin  1 tablet Oral QHS  . NIFEdipine  30 mg Oral BID  Continuous Infusions:   LOS: 8 days        Octave Montrose Gerome Apley, MD [facility] Pager [phone]

## 2018-03-22 NOTE — Progress Notes (Signed)
Inpatient Diabetes Program Recommendations  AACE/ADA: New Consensus Statement on Inpatient Glycemic Control (2015)  Target Ranges:  Prepandial:   less than 140 mg/dL      Peak postprandial:   less than 180 mg/dL (1-2 hours)      Critically ill patients:  140 - 180 mg/dL   Lab Results  Component Value Date   GLUCAP 425 (H) 03/22/2018   HGBA1C 9.1 (H) 02/13/2018    Review of Glycemic Control  Post-prandials elevated.   Inpatient Diabetes Program Recommendations:     Increase Novolog to 7 units tidwc if pt eats > 50% meal. Do not give if pt does not eat.  Continue to follow.  Thank you. Lorenda Peck, RD, LDN, CDE Inpatient Diabetes Coordinator [phone]

## 2018-03-23 DIAGNOSIS — E160 Drug-induced hypoglycemia without coma: Secondary | ICD-10-CM

## 2018-03-23 DIAGNOSIS — T383X5A Adverse effect of insulin and oral hypoglycemic [antidiabetic] drugs, initial encounter: Secondary | ICD-10-CM

## 2018-03-23 LAB — BASIC METABOLIC PANEL
Anion gap: 13 (ref 5–15)
BUN: 48 mg/dL — ABNORMAL HIGH (ref 6–20)
CO2: 26 mmol/L (ref 22–32)
Calcium: 8.5 mg/dL — ABNORMAL LOW (ref 8.9–10.3)
Chloride: 97 mmol/L — ABNORMAL LOW (ref 98–111)
Creatinine, Ser: 10.29 mg/dL — ABNORMAL HIGH (ref 0.61–1.24)
GFR calc Af Amer: 7 mL/min — ABNORMAL LOW (ref >60)
GFR calc non Af Amer: 6 mL/min — ABNORMAL LOW (ref >60)
Glucose, Bld: 162 mg/dL — ABNORMAL HIGH (ref 70–99)
POTASSIUM: 4.1 mmol/L (ref 3.5–5.1)
Sodium: 136 mmol/L (ref 135–145)

## 2018-03-23 LAB — GLUCOSE, CAPILLARY
GLUCOSE-CAPILLARY: 193 mg/dL — AB (ref 70–99)
Glucose-Capillary: 117 mg/dL — ABNORMAL HIGH (ref 70–99)
Glucose-Capillary: 278 mg/dL — ABNORMAL HIGH (ref 70–99)
Glucose-Capillary: 210 mg/dL — ABNORMAL HIGH (ref 70–99)
Glucose-Capillary: 48 mg/dL — ABNORMAL LOW (ref 70–99)
Glucose-Capillary: 88 mg/dL (ref 70–99)
Glucose-Capillary: 288 mg/dL — ABNORMAL HIGH (ref 70–99)

## 2018-03-23 MED ORDER — CHLORHEXIDINE GLUCONATE CLOTH 2 % EX PADS
6.0000 | MEDICATED_PAD | Freq: Every day | CUTANEOUS | Status: DC
  Administered 2018-03-23: 6 via TOPICAL

## 2018-03-23 NOTE — Progress Notes (Signed)
[facility] Progress Note   Dialysis Orders: TTS SW 4h 62min 500/800 88.5kg 2/2.25 LUA AVF Hep 5000 Sensipar 90 venofer 50/wk Mircera 50 every 2 wks, last 12/28  Assessment/Plan: 1.T1DM, uncontrolled w/Hypoglycemia - found down at home, now improved. Per primary- some issues, now hyperglycemia  2. Acute encephalopathy/seizure - 2/2 hypoglycemia & non compliance with meds, improving but seems paranoid or something today  3. Acute respiratory failure - Improved. Self extubated, breathing stable 4. ESRD -on HD TTS. Doing routine HD while here. HD Saturday  5. Anemia of CKD-Hgb 11.3- now higher ? 14 ? No indication for ESA at this time.  6. Secondary hyperparathyroidism -Ca 8.6, Phos5.8, continue sensipar, binders. Not on VDRA.  P now in goal. 7. HTN/volume -BP overall  improved . net UF 2.2 Thursday. On bidil and procardia 30 - post wt 88.2 Thursday - possibly more room for volume removal 8. Nutrition -Renal/Carb modifed diet w/fluid restrictions, renavite. Protein supplements. 9. Dispo- unclear   Myriam Jacobson, PA-C [facility] Beeper [phone] 03/23/2018,9:45 AM  LOS: 9 days   Subjective:   No complaints today. No pain. No problems with HD.  Objective Vitals:   03/22/18 1147 03/22/18 1823 03/22/18 2048 03/23/18 0742  BP: (!) 155/89 (!) 162/100 130/79 (!) 152/85  Pulse: 78 99 88 81  Resp: 18 18 20 17   Temp: 98.1 F (36.7 C) 98.5 F (36.9 C) 98.2 F (36.8 C) 97.7 F (36.5 C)  TempSrc: Oral Oral Oral Oral  SpO2: 100% 100% 97% 100%  Weight:   88.6 kg   Height:       Physical Exam General: WDWNM Resting in bed.  Not opening eyes to talk with me. Heart: RRR Lungs: no rales Abdomen: soft NTND + BS Extremities: no LE edema Dialysis Access: left upper AVF + bruit   Additional Objective Labs: Basic Metabolic Panel: Recent Labs  Lab 03/17/18 0717 03/20/18 0813 03/21/18 1449 03/22/18 0627 03/23/18 0346  NA 137 134*  130* 136 136  K 3.9 5.4* 5.3* 4.6 4.1  CL 95* 96* 91* 97* 97*  CO2 24 22 24 24 26   GLUCOSE 189* 373* 590* 210* 162*  BUN 39* 61* 57* 71* 48*  CREATININE 12.94* 13.92* 11.23* 12.50* 10.29*  CALCIUM 8.2* 8.6* 8.6* 9.1 8.5*  PHOS 6.4* 5.8*  --  5.0*  --    Liver Function Tests: Recent Labs  Lab 03/20/18 0813 03/22/18 0627  ALBUMIN 3.0* 3.1*   No results for input(s): LIPASE, AMYLASE in the last 168 hours. CBC: Recent Labs  Lab 03/17/18 0717 03/20/18 0813 03/22/18 0627  WBC 6.5 4.2 2.3*  HGB 11.8* 11.3* 14.0  HCT 37.3* 34.1* 42.7  MCV 89.9 88.6 85.7  PLT 208 308 238   Blood Culture    Component Value Date/Time   SDES TRACHEAL ASPIRATE 03/15/2018 0738   SPECREQUEST NONE 03/15/2018 0738   CULT MODERATE Consistent with normal respiratory flora. 03/15/2018 0738   REPTSTATUS 03/17/2018 FINAL 03/15/2018 0738    Cardiac Enzymes: No results for input(s): CKTOTAL, CKMB, CKMBINDEX, TROPONINI in the last 168 hours. CBG: Recent Labs  Lab 03/22/18 1145 03/22/18 1609 03/22/18 2049 03/23/18 0118 03/23/18 0739  GLUCAP 188* 425* 91 193* 117*   Iron Studies: No results for input(s): IRON, TIBC, TRANSFERRIN, FERRITIN in the last 72 hours. Lab Results  Component Value Date   INR 1.00 03/14/2018   INR 1.13 03/06/2017   INR 1.15 03/05/2017   Studies/Results: No results found. Medications:  . calcium acetate  2,001 mg Oral TID WC  . Chlorhexidine Gluconate Cloth  6 each Topical Q0600  . cinacalcet  90 mg Oral Q T,Th,Sa-HD  . feeding supplement (NEPRO CARB STEADY)  237 mL Oral BID BM  . heparin  5,000 Units Subcutaneous Q8H  . insulin aspart  0-15 Units Subcutaneous TID WC  . insulin aspart  5 Units Subcutaneous TID WC  . insulin glargine  10 Units Subcutaneous QHS  . insulin glargine  12 Units Subcutaneous Daily  . isosorbide-hydrALAZINE  1 tablet Oral TID  . levETIRAcetam  250 mg Oral Q T,Th,Sat-1800  . levETIRAcetam  500 mg Oral Q0600  . mouth rinse  15 mL Mouth Rinse  BID  . multivitamin  1 tablet Oral QHS  . NIFEdipine  30 mg Oral BID

## 2018-03-23 NOTE — Progress Notes (Signed)
PROGRESS NOTE    Edward Colon  NKN:[id number] DOB: 01-12-1981 DOA: 03/14/2018 PCP: Nolene Ebbs, MD    Brief Narrative:  38 year old male who presented altered mentation and hypoglycemia. He does have the significant past medical history of type 1 diabetes mellitus, end-stage renal disease on hemodialysis, hypertension and seizures. Patient was found unresponsive at home, his capital glucose was down to 33, he had a witnessed generalized seizure. Response to glucagon and dextrose, required invasive mechanical ventilation. His blood pressure was 137/85, heart rate 78, temperature 93.8, respirate 18, oxygen saturation 100percent on mechanical ventilation.His lungs are clear to auscultation bilaterally, heart S1-S2 present and rhythmic, abdomen soft nontender, no lower extremity edema, no rashes.  Patient was admitted to the intensive care unit with a working diagnosis of metabolic encephalopathy due to severe hypoglycemia complicated by generalized seizures.  Patient was liberated from mechanical ventilation on January 10, transferred to triad hospitalist January 12.Continue to have widely fluctuating capillary glucose measurements   Assessment & Plan:   Active Problems:   ESRD on dialysis [facility])   Altered mental status   Diabetic neuropathy (HCC)   Blindness of left eye with normal vision in contralateral eye   Agitation   Encephalopathy  1.Metabolic encephalopathy due to hypoglycemia. Improved glucose since last night, will continue current regimen with close glucose monitoring every 4 hours, patient has high risk for hypoglycemia due to ESRD. He has been tolerating po well, with no nausea or vomiting. Continue current regimen with glargine 10 units q pm and 12 units q am, plus pre-meal insulin 5 units aspart. Plus insulin sliding scale.   2.Generalized seizures. On Keppra, no seizures.    3.End-stage renal disease on hemodialysis.TTS Continue with calcium  acetate and sensipar for metabolic bone disease. Plan for HD in am.   4. Diabetic neuropathy. No pain reported. .   5. HTN. Blood pressure control with nifedipine, isosorbide and hydralazine.    DVT prophylaxis: heparin   Code Status:  full Family Communication: no family at the bedside  Disposition Plan/ discharge barriers: plan for dc in am after HD if glucose stable.   Body mass index is 26.49 kg/m. Malnutrition Type:  Nutrition Problem: Inadequate oral intake Etiology: acute illness   Malnutrition Characteristics:  Signs/Symptoms: NPO status   Nutrition Interventions:  Interventions: MVI, Nepro shake  RN Pressure Injury Documentation:     Consultants:     Procedures:     Antimicrobials:       Subjective: Patient this am with no nausea or vomiting, no chest pain or dyspnea, tolerated HD well. Glucose has improved since last night but continue to be unpredictable.   Objective: Vitals:   03/22/18 1147 03/22/18 1823 03/22/18 2048 03/23/18 0742  BP: (!) 155/89 (!) 162/100 130/79 (!) 152/85  Pulse: 78 99 88 81  Resp: 18 18 20 17   Temp: 98.1 F (36.7 C) 98.5 F (36.9 C) 98.2 F (36.8 C) 97.7 F (36.5 C)  TempSrc: Oral Oral Oral Oral  SpO2: 100% 100% 97% 100%  Weight:   88.6 kg   Height:        Intake/Output Summary (Last 24 hours) at 03/23/2018 0821 Last data filed at 03/23/2018 0700 Gross per 24 hour  Intake 520 ml  Output 2210 ml  Net -1690 ml   Filed Weights   03/22/18 0650 03/22/18 1100 03/22/18 2048  Weight: 90.4 kg 88.2 kg 88.6 kg    Examination:   General: Not in pain or dyspnea  Neurology: Awake and alert,  non focal  E ENT: mild pallor, no icterus, oral mucosa moist Cardiovascular: No JVD. S1-S2 present, rhythmic, no gallops, rubs, or murmurs. No lower extremity edema. Pulmonary: positive breath sounds bilaterally, adequate air movement, no wheezing, rhonchi or rales. Gastrointestinal. Abdomen with no organomegaly, non  tender, no rebound or guarding Skin. No rashes Musculoskeletal: no joint deformities     Data Reviewed: I have personally reviewed following labs and imaging studies  CBC: Recent Labs  Lab 03/17/18 0717 03/20/18 0813 03/22/18 0627  WBC 6.5 4.2 2.3*  HGB 11.8* 11.3* 14.0  HCT 37.3* 34.1* 42.7  MCV 89.9 88.6 85.7  PLT 208 308 767   Basic Metabolic Panel: Recent Labs  Lab 03/16/18 1711 03/17/18 0717 03/20/18 0813 03/21/18 1449 03/22/18 0627 03/23/18 0346  NA  --  137 134* 130* 136 136  K  --  3.9 5.4* 5.3* 4.6 4.1  CL  --  95* 96* 91* 97* 97*  CO2  --  24 22 24 24 26   GLUCOSE  --  189* 373* 590* 210* 162*  BUN  --  39* 61* 57* 71* 48*  CREATININE  --  12.94* 13.92* 11.23* 12.50* 10.29*  CALCIUM  --  8.2* 8.6* 8.6* 9.1 8.5*  MG 2.3 2.4  --   --   --   --   PHOS 5.0* 6.4* 5.8*  --  5.0*  --    GFR: Estimated Creatinine Clearance: 11 mL/min (A) (by C-G formula based on SCr of 10.29 mg/dL (H)). Liver Function Tests: Recent Labs  Lab 03/20/18 0813 03/22/18 0627  ALBUMIN 3.0* 3.1*   No results for input(s): LIPASE, AMYLASE in the last 168 hours. No results for input(s): AMMONIA in the last 168 hours. Coagulation Profile: No results for input(s): INR, PROTIME in the last 168 hours. Cardiac Enzymes: No results for input(s): CKTOTAL, CKMB, CKMBINDEX, TROPONINI in the last 168 hours. BNP (last 3 results) No results for input(s): PROBNP in the last 8760 hours. HbA1C: No results for input(s): HGBA1C in the last 72 hours. CBG: Recent Labs  Lab 03/22/18 1145 03/22/18 1609 03/22/18 2049 03/23/18 0118 03/23/18 0739  GLUCAP 188* 425* 91 193* 117*   Lipid Profile: No results for input(s): CHOL, HDL, LDLCALC, TRIG, CHOLHDL, LDLDIRECT in the last 72 hours. Thyroid Function Tests: No results for input(s): TSH, T4TOTAL, FREET4, T3FREE, THYROIDAB in the last 72 hours. Anemia Panel: No results for input(s): VITAMINB12, FOLATE, FERRITIN, TIBC, IRON, RETICCTPCT in the  last 72 hours.    Radiology Studies: I have reviewed all of the imaging during this hospital visit personally     Scheduled Meds: . calcium acetate  2,001 mg Oral TID WC  . Chlorhexidine Gluconate Cloth  6 each Topical Q0600  . cinacalcet  90 mg Oral Q T,Th,Sa-HD  . feeding supplement (NEPRO CARB STEADY)  237 mL Oral BID BM  . heparin  5,000 Units Subcutaneous Q8H  . insulin aspart  0-15 Units Subcutaneous TID WC  . insulin aspart  5 Units Subcutaneous TID WC  . insulin glargine  10 Units Subcutaneous QHS  . insulin glargine  12 Units Subcutaneous Daily  . isosorbide-hydrALAZINE  1 tablet Oral TID  . levETIRAcetam  250 mg Oral Q T,Th,Sat-1800  . levETIRAcetam  500 mg Oral Q0600  . mouth rinse  15 mL Mouth Rinse BID  . multivitamin  1 tablet Oral QHS  . NIFEdipine  30 mg Oral BID   Continuous Infusions:   LOS: 9 days   Gerome Apley, MD [facility] Pager [phone]

## 2018-03-23 NOTE — Progress Notes (Signed)
Patient refused CBG check and Vital signs check this am. Valaria Good, Wonda Cheng, RN

## 2018-03-23 NOTE — Progress Notes (Signed)
Patient's temperture was 100.8. Administered tylenol, and temp recheck was 100.7. Paged MD. Will continue to monitor.   Farley Ly RN

## 2018-03-24 DIAGNOSIS — E1041 Type 1 diabetes mellitus with diabetic mononeuropathy: Secondary | ICD-10-CM

## 2018-03-24 DIAGNOSIS — R451 Restlessness and agitation: Secondary | ICD-10-CM

## 2018-03-24 DIAGNOSIS — R401 Stupor: Secondary | ICD-10-CM

## 2018-03-24 LAB — BASIC METABOLIC PANEL
Anion gap: 16 — ABNORMAL HIGH (ref 5–15)
BUN: 69 mg/dL — ABNORMAL HIGH (ref 6–20)
CO2: 23 mmol/L (ref 22–32)
Calcium: 9.2 mg/dL (ref 8.9–10.3)
Chloride: 94 mmol/L — ABNORMAL LOW (ref 98–111)
Creatinine, Ser: 13.18 mg/dL — ABNORMAL HIGH (ref 0.61–1.24)
GFR calc Af Amer: 5 mL/min — ABNORMAL LOW (ref >60)
GFR calc non Af Amer: 4 mL/min — ABNORMAL LOW (ref >60)
Glucose, Bld: 258 mg/dL — ABNORMAL HIGH (ref 70–99)
Potassium: 4.9 mmol/L (ref 3.5–5.1)
Sodium: 133 mmol/L — ABNORMAL LOW (ref 135–145)

## 2018-03-24 LAB — GLUCOSE, CAPILLARY
GLUCOSE-CAPILLARY: 310 mg/dL — AB (ref 70–99)
Glucose-Capillary: 156 mg/dL — ABNORMAL HIGH (ref 70–99)
Glucose-Capillary: 241 mg/dL — ABNORMAL HIGH (ref 70–99)
Glucose-Capillary: 264 mg/dL — ABNORMAL HIGH (ref 70–99)
Glucose-Capillary: 375 mg/dL — ABNORMAL HIGH (ref 70–99)
Glucose-Capillary: 452 mg/dL — ABNORMAL HIGH (ref 70–99)

## 2018-03-24 MED ORDER — INSULIN GLARGINE 100 UNIT/ML ~~LOC~~ SOLN: 12.0000 [IU] | Freq: Every day | SUBCUTANEOUS | 0 refills | Status: DC

## 2018-03-24 MED ORDER — LEVETIRACETAM 500 MG PO TABS: 500.0000 mg | ORAL_TABLET | Freq: Every day | ORAL | 0 refills | Status: DC

## 2018-03-24 MED ORDER — INSULIN GLARGINE 100 UNIT/ML ~~LOC~~ SOLN: 10.0000 [IU] | Freq: Every day | SUBCUTANEOUS | 0 refills | Status: DC

## 2018-03-24 MED ORDER — LEVETIRACETAM 250 MG PO TABS: 250.0000 mg | ORAL_TABLET | ORAL | 0 refills | Status: DC

## 2018-03-24 MED ORDER — INSULIN ASPART 100 UNIT/ML ~~LOC~~ SOLN
0.0000 [IU] | Freq: Three times a day (TID) | SUBCUTANEOUS | Status: DC
  Administered 2018-03-24 – 2018-03-25 (×2): 7 [IU] via SUBCUTANEOUS
  Administered 2018-03-25: 9 [IU] via SUBCUTANEOUS
  Administered 2018-03-26: 3 [IU] via SUBCUTANEOUS
  Administered 2018-03-26: 1 [IU] via SUBCUTANEOUS
  Administered 2018-03-26 – 2018-03-27 (×2): 5 [IU] via SUBCUTANEOUS
  Administered 2018-03-28 (×2): 2 [IU] via SUBCUTANEOUS

## 2018-03-24 MED ORDER — INSULIN ASPART 100 UNIT/ML ~~LOC~~ SOLN: 0.0000 [IU] | Freq: Three times a day (TID) | SUBCUTANEOUS | 0 refills | Status: DC

## 2018-03-24 MED ORDER — INSULIN ASPART 100 UNIT/ML ~~LOC~~ SOLN
12.0000 [IU] | Freq: Once | SUBCUTANEOUS | Status: AC
  Administered 2018-03-24: 12 [IU] via SUBCUTANEOUS

## 2018-03-24 NOTE — Discharge Summary (Addendum)
Physician Discharge Summary  Edward Colon JGO:[id number] DOB: [birth year] DOA: 03/14/2018  PCP: Nolene Ebbs, MD  Admit date: 03/14/2018 Discharge date: 03/28/2018  Admitted From: Home  Disposition: Inpatient psych  Recommendations for Outpatient Follow-up and new medication changes:  1. Follow up with Dr. Jeanie Cooks in 7 days.  2. Patient has been placed on insulin glargine units14 units q am and 8  q pm. 3.  insulin sliding scale.  4. Instructed to keep a log of capillary glucose. 5. Please follow up with endocrinology clinic as outpatient, patient may benefit from insulin pump.  6. Per [place] statutes, patients with seizures are not allowed to drive until  they have been seizure-free for six months. Use caution when using heavy equipment or power tools. Avoid working on ladders or at heights. Take showers instead of baths. Ensure the water temperature is not too high on the home water heater. Do not go swimming alone. When caring for infants or small children, sit down when holding, feeding, or changing them to minimize risk of injury to the child in the event you have a seizure. Also, Maintain good sleep hygiene. Avoid alcohol.  7. Follow up with Dr. Buddy Duty from endocrinology as outpatient.      Home Health: no   Equipment/Devices: no    Discharge Condition: stable  CODE STATUS: full  Diet recommendation: heart healthy, diabetic and renal prudent.   Brief/Interim Summary: 38 year old male who presented altered mentation and hypoglycemia. He does have the significant past medical history of type 1 diabetes mellitus, end-stage renal disease on hemodialysis, hypertension and seizures. Patient was found unresponsive at home, his capillary glucose was down to 33, he had a witnessed generalized seizure. He had a poor response to glucagon and dextrose, and required invasive mechanical ventilation. His blood pressure was 137/85, heart rate 78, temperature 93.8, respiratory rate  18, oxygen saturation 100percent on mechanical ventilation.His lungs were clear to auscultation bilaterally, heart S1-S2 present and rhythmic, abdomen soft nontender, no lower extremity edema, no rashes.  Sodium 140, potassium 4.5, chloride 100, bicarb 26, glucose 155, BUN 28, creatinine 10.8, white count 4.5, hemoglobin 12.8, hematocrit 41.6, platelets 313, urinalysis with greater than 300 protein, glucose greater than 500.  Head CT was negative for acute changes.  Chest radiograph with no infiltrates.  His EKG was normal sinus rhythm with a normal axis, poor R wave progression and left ventricular hypertrophy.  Patient was admitted to the intensive care unit with a working diagnosis of metabolic encephalopathy due to severe hypoglycemia complicated by generalized seizures.  Patient was liberated from mechanical ventilation on January 10, transferred to [facility] January 12.  1.  Metabolic encephalopathy due to hypoglycemia.  Patient was admitted to the intensive care unit, patient was placed on D10 infusion with close capillary glucose monitoring.  Glucose was successfully stabilized, and patient was transitioned to subcutaneous insulin.  His metabolic encephalopathy improved and patient was liberated from invasive mechanical ventilation.   Once on the medical ward, he had hyperglycemia up to 500, required further adjustment of his insulin. Apparentely at home he was taking 25 units of glargine.  At discharge his insulin regimen consists of glargine 14 units in the morning and 8 units at night, along with insulin aspart sliding scale before meals.  His discharge fasting glucose is 258 mg/dl.  At home prior to hospitalization he was using 25 units daily of long acting insulin plus pre-meal insulin sliding scale, usually 2 to 3 units. For now will  continue this regimen, with recommendations to use a standard pre-meal dosing of short acting insulin once patient back to his home routing eating  habits.   2.  Generalized seizures.  Patient was seen by neurology, and underwent electroencephalography showed multiple sharp waves more predominant on the left, no electrographic seizure activity.  Final recommendation to continue Keppra for now, renally dose, he will need outpatient neurology follow-up.  It is likely that this medication can be eventually discontinued as an outpatient.  Seizures precautions were given.  3. ESRD on HD, TTS.  Patient was seen by nephrology, he underwent hemodialysis with no major complications.  Continue calcium acetate for metabolic bone disease.  4.  Hypertension.  His antihypertensive agents were continue during his hospitalization, nifedipine, isosorbide and hydralazine.  His blood pressure has been 151 to 153 mmHg at discharge. At discharge will resume minoxidil, but will continue to hold on losartan and aldactone to prevent hyperkalemia.   5.  Type 1 diabetes mellitus, complicated by neuropathy and retinopathy.  Patient needs close follow-up with endocrinology, he may benefit from an insulin pump, for now patient will be discharged with basal insulin therapy along with pre-meal short-acting insulin, and a low dose insulin sliding scale to prevent insulin stacking and hypoglycemia.  He was instructed to keep a log of his capillary glucose for further adjustments in his glucose.transfer to inpatient psych facility.  6. Depression - seen by psych, as per their recommendation,started on Prozac 20 mg daily. 1:1 sitter, and transfer to inpatient psych facility   Discharge Diagnoses:  Principal Problem:   Major depressive disorder, single episode, severe ([place]) Active Problems:   ESRD on dialysis ([place])   Altered mental status   Diabetic neuropathy ([place])   Blindness of left eye with normal vision in contralateral eye   Agitation   Encephalopathy    Discharge Instructions  Discharge Instructions    Diet - low sodium heart healthy   Complete by:  As  directed    Discharge instructions   Complete by:  As directed    Please follow with primary care in 7 days.   Discharge instructions   Complete by:  As directed    Per [facility] statutes, patients with seizures are not allowed to drive until they have been seizure-free for six months. Use caution when using heavy equipment or power tools. Avoid working on ladders or at heights. Take showers instead of baths. Ensure the water temperature is not too high on the home water heater. Do not go swimming alone. When caring for infants or small children, sit down when holding, feeding, or changing them to minimize risk of injury to the child in the event you have a seizure. Also, Maintain good sleep hygiene. Avoid alcohol   Increase activity slowly   Complete by:  As directed      Allergies as of 03/28/2018   No Known Allergies     Medication List    STOP taking these medications   losartan 50 MG tablet Commonly known as:  COZAAR   spironolactone 50 MG tablet Commonly known as:  ALDACTONE     TAKE these medications   calcium acetate 667 MG capsule Commonly known as:  PHOSLO Take 1 capsule (667 mg total) by mouth 3 (three) times daily with meals. What changed:    how much to take  when to take this  additional instructions   FLUoxetine 20 MG capsule Commonly known as:  PROZAC Take 1 capsule (20 mg  total) by mouth daily. Start taking on:  March 29, 2018   insulin aspart 100 UNIT/ML injection Commonly known as:  novoLOG Inject 0-9 Units into the skin 3 (three) times daily with meals for 30 days. For glucose 121 to 150 use one unit, for 151 to 200 use two units, for 201 to 250 use three units, for 251 to 300 use five units, for 301 to 350 use seven units, for 351 or greater use 9 units.   insulin glargine 100 UNIT/ML injection Commonly known as:  LANTUS Inject 0.14 mLs (14 Units total) into the skin every morning. What changed:    how much to take  when to take  this   insulin glargine 100 UNIT/ML injection Commonly known as:  LANTUS Inject 0.08 mLs (8 Units total) into the skin at bedtime. Start taking on:  March 29, 2018 What changed:  You were already taking a medication with the same name, and this prescription was added. Make sure you understand how and when to take each.   isosorbide-hydrALAZINE 20-37.5 MG tablet Commonly known as:  BIDIL Take 1 tablet by mouth 3 (three) times daily.   levETIRAcetam 250 MG tablet Commonly known as:  KEPPRA Take 1 tablet (250 mg total) by mouth every Tuesday, Thursday, and Saturday at 6 PM for 30 days. What changed:  You were already taking a medication with the same name, and this prescription was added. Make sure you understand how and when to take each.   levETIRAcetam 500 MG tablet Commonly known as:  KEPPRA Take 1 tablet (500 mg total) by mouth daily at 6 (six) AM for 30 days. What changed:  when to take this   minoxidil 2.5 MG tablet Commonly known as:  LONITEN Take 2.5 mg by mouth 2 (two) times daily.   multivitamin Tabs tablet Take 1 tablet by mouth at bedtime. What changed:  when to take this   NIFEdipine 30 MG 24 hr tablet Commonly known as:  ADALAT CC Take 1 tablet (30 mg total) by mouth 2 (two) times daily.      Follow-up Information    Nolene Ebbs, MD Follow up in 1 week(s).   Specialty:  Internal Medicine Contact information: [address] [place] [address] [phone]        Delrae Rend, MD Follow up in 1 week(s).   Specialty:  Endocrinology Contact information: [address] [place] [address] [phone]          No Known Allergies  Consultations:  Nephrology   Neurology   Psychiatry   Procedures/Studies: Ct Head Wo Contrast  Result Date: 03/14/2018 CLINICAL DATA:  38 y/o  M; found unresponsive this a.m. EXAM: CT HEAD WITHOUT CONTRAST TECHNIQUE: Contiguous axial images were obtained from the base of the skull  through the vertex without intravenous contrast. COMPARISON:  03/12/2017 CT head. FINDINGS: Brain: No evidence of acute infarction, hemorrhage, hydrocephalus, extra-axial collection or mass lesion/mass effect. Vascular: No hyperdense vessel or unexpected calcification. Skull: Normal. Negative for fracture or focal lesion. Sinuses/Orbits: Stable posterior left globe calcification and lentiform posterolateral collections compatible with chronic choroidal effusion or retinal detachment. Normal appearance of the right globe. Mucous retention cyst within the left maxillary sinus and mild sphenoid sinus mucosal thickening. Normal aeration of mastoid air cells. Other: None. IMPRESSION: No acute intracranial abnormality is identified. Stable CT of head. Electronically Signed   By: Kristine Garbe M.D.   On: 03/14/2018 16:49   Ct Head Wo Contrast  Result Date: 03/13/2018 CLINICAL DATA:  Syncope with unwitnessed seizure EXAM: CT HEAD WITHOUT CONTRAST TECHNIQUE: Contiguous axial images were obtained from the base of the skull through the vertex without intravenous contrast. COMPARISON:  02/08/2018 FINDINGS: Brain: No evidence of acute infarction, hemorrhage, hydrocephalus, extra-axial collection or mass lesion/mass effect. Vascular: No hyperdense vessel or unexpected calcification. Skull: Normal. Negative for fracture or focal lesion. 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 Chronic retinal detachment is also possibility accounting for this appearance. Clear paranasal sinuses and mastoids. Other: None IMPRESSION: 1. No acute intracranial abnormality. 2. 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 Electronically Signed   By: Ashley Royalty M.D.   On: 03/13/2018  00:34   Dg Chest Port 1 View  Result Date: 03/16/2018 CLINICAL DATA:  Encounter for intubation. EXAM: PORTABLE CHEST 1 VIEW COMPARISON:  Radiograph of March 15, 2018. FINDINGS: Stable cardiomegaly. Endotracheal and nasogastric tubes are unchanged in position. No pneumothorax or pleural effusion is noted. Left lung is clear. Minimal right basilar subsegmental atelectasis is noted. Bony thorax is unremarkable. IMPRESSION: Stable support apparatus. Minimal right basilar subsegmental atelectasis. Electronically Signed   By: Marijo Conception, M.D.   On: 03/16/2018 07:43   Dg Chest Port 1 View  Result Date: 03/15/2018 CLINICAL DATA:  Intubation.  Respiratory failure. EXAM: PORTABLE CHEST 1 VIEW COMPARISON:  03/14/2018. FINDINGS: Endotracheal tube and NG tube in stable position. Cardiomegaly. Interim improvement of pulmonary venous congestion. Mild atelectasis right lung base. Tiny right pleural effusion. No pneumothorax. IMPRESSION: 1.  Lines and tubes in stable position. 2.  Mild atelectasis right lung base.  Tiny right pleural effusion. 3. Stable cardiomegaly. Interim improvement of pulmonary venous congestion. Electronically Signed   By: Marcello Moores  Register   On: 03/15/2018 06:02   Dg Chest Portable 1 View  Result Date: 03/14/2018 CLINICAL DATA:  Intubation EXAM: PORTABLE CHEST 1 VIEW COMPARISON:  03/14/2018 FINDINGS: Endotracheal tube 6 cm above the carina. NG tube is in the stomach. Mild cardiomegaly and vascular congestion. No confluent opacities, effusions or edema. IMPRESSION: Support devices in expected position as above. Mild cardiomegaly, vascular congestion. Electronically Signed   By: Rolm Baptise M.D.   On: 03/14/2018 09:45   Dg Chest Port 1 View  Result Date: 03/14/2018 CLINICAL DATA:  Unresponsive EXAM: PORTABLE CHEST 1 VIEW COMPARISON:  March 12, 2018 FINDINGS: There is no appreciable edema or consolidation. There is stable cardiac prominence with pulmonary vascularity within normal limits. No  adenopathy. No pneumothorax. No bone lesions. IMPRESSION: Stable cardiac prominence.  No edema or consolidation. Electronically Signed   By: Lowella Grip III M.D.   On: 03/14/2018 09:00   Dg Chest Port 1 View  Result Date: 03/12/2018 CLINICAL DATA:  Altered mental status. EXAM: PORTABLE CHEST 1 VIEW COMPARISON:  02/08/2018 FINDINGS: Low lung volumes. Stable cardiomegaly. No pulmonary edema, focal airspace disease, pneumothorax or large pleural effusion. No acute osseous abnormalities. IMPRESSION: Low lung volumes without acute abnormality. Stable cardiomegaly. Electronically Signed   By: Keith Rake M.D.   On: 03/12/2018 21:18   Dg Abd Portable 1v  Result Date: 03/15/2018 CLINICAL DATA:  Feeding tube placement EXAM: PORTABLE ABDOMEN - 1 VIEW COMPARISON:  03/15/2018 FINDINGS: Enteric tube has been withdrawn. Tip is now localized to the left upper quadrant, consistent with location in the upper  body of the stomach. Scattered gas in the stomach and visualized small bowel/colon without abnormal distention. IMPRESSION: Enteric tube tip is in the left upper quadrant, consistent with location in the upper body of the stomach. Electronically Signed   By: Lucienne Capers M.D.   On: 03/15/2018 22:32   Dg Abd Portable 1v  Result Date: 03/15/2018 CLINICAL DATA:  NG tube EXAM: PORTABLE ABDOMEN - 1 VIEW COMPARISON:  03/14/2018 FINDINGS: NG tube is folded in the proximal stomach, tip in the stomach fundus. Mild gastric distention with air. Nonobstructive bowel gas pattern. Mild cardiac enlargement. Visualized bases are clear. No acute osseous finding. IMPRESSION: NG tube folded in the stomach. Electronically Signed   By: Jerilynn Mages.  Shick M.D.   On: 03/15/2018 19:34   Dg Abd Portable 1 View  Result Date: 03/14/2018 CLINICAL DATA:  Nasogastric tube placement. EXAM: PORTABLE ABDOMEN - 1 VIEW COMPARISON:  None. FINDINGS: The bowel gas pattern is normal. Distal tip of nasogastric tube is seen in proximal stomach. No  radio-opaque calculi or other significant radiographic abnormality are seen. IMPRESSION: Distal tip of nasogastric tube seen in proximal stomach. No evidence of bowel obstruction or ileus. Electronically Signed   By: Marijo Conception, M.D.   On: 03/14/2018 09:46         Discharge Exam: Vitals:   03/28/18 1710 03/28/18 2007  BP: (!) 150/83 (!) 150/87  Pulse: 83 88  Resp: 18 19  Temp: (!) 97.5 F (36.4 C) 98.8 F (37.1 C)  SpO2: 100% 99%   Vitals:   03/27/18 2019 03/28/18 0948 03/28/18 1710 03/28/18 2007  BP: 132/76 (!) 155/84 (!) 150/83 (!) 150/87  Pulse: 96 87 83 88  Resp: 14 18 18 19   Temp: 98.2 F (36.8 C) 98.5 F (36.9 C) (!) 97.5 F (36.4 C) 98.8 F (37.1 C)  TempSrc: Oral Oral Oral Oral  SpO2: 99% 99% 100% 99%  Weight:      Height:           The results of significant diagnostics from this hospitalization (including imaging, microbiology, ancillary and laboratory) are listed below for reference.     Microbiology: No results found for this or any previous visit (from the past 240 hour(s)).   Labs: BNP (last 3 results) No results for input(s): BNP in the last 8760 hours. Basic Metabolic Panel: Recent Labs  Lab 03/22/18 0627 03/23/18 0346 03/24/18 0608 03/27/18 0434 03/28/18 0439  NA 136 136 133* 137 135  K 4.6 4.1 4.9 4.3 5.0  CL 97* 97* 94* 96* 99  CO2 24 26 23 24 26   GLUCOSE 210* 162* 258* 39* 217*  BUN 71* 48* 69* 91* 48*  CREATININE 12.50* 10.29* 13.18* 17.37* 11.09*  CALCIUM 9.1 8.5* 9.2 9.1 7.9*  PHOS 5.0*  --   --   --   --    Liver Function Tests: Recent Labs  Lab 03/22/18 0627  ALBUMIN 3.1*   No results for input(s): LIPASE, AMYLASE in the last 168 hours. No results for input(s): AMMONIA in the last 168 hours. CBC: Recent Labs  Lab 03/22/18 0627 03/27/18 0740  WBC 2.3* 4.7  HGB 14.0 10.8*  HCT 42.7 32.3*  MCV 85.7 86.4  PLT 238 309   Cardiac Enzymes: No results for input(s): CKTOTAL, CKMB, CKMBINDEX, TROPONINI in the last  168 hours. BNP: Invalid input(s): POCBNP CBG: Recent Labs  Lab 03/28/18 0008 03/28/18 0734 03/28/18 1114 03/28/18 1705 03/28/18 2003  GLUCAP 149* 157* 184* 103* 80   D-Dimer  No results for input(s): DDIMER in the last 72 hours. Hgb A1c No results for input(s): HGBA1C in the last 72 hours. Lipid Profile No results for input(s): CHOL, HDL, LDLCALC, TRIG, CHOLHDL, LDLDIRECT in the last 72 hours. Thyroid function studies No results for input(s): TSH, T4TOTAL, T3FREE, THYROIDAB in the last 72 hours.  Invalid input(s): FREET3 Anemia work up No results for input(s): VITAMINB12, FOLATE, FERRITIN, TIBC, IRON, RETICCTPCT in the last 72 hours. Urinalysis    Component Value Date/Time   COLORURINE YELLOW 03/14/2018 1135   APPEARANCEUR CLEAR 03/14/2018 1135   LABSPEC 1.014 03/14/2018 1135   PHURINE 8.0 03/14/2018 1135   GLUCOSEU >=500 (A) 03/14/2018 1135   HGBUR NEGATIVE 03/14/2018 1135   BILIRUBINUR NEGATIVE 03/14/2018 1135   KETONESUR NEGATIVE 03/14/2018 1135   PROTEINUR >=300 (A) 03/14/2018 1135   UROBILINOGEN 0.2 10/26/2013 2324   NITRITE NEGATIVE 03/14/2018 1135   LEUKOCYTESUR NEGATIVE 03/14/2018 1135   Sepsis Labs Invalid input(s): PROCALCITONIN,  WBC,  LACTICIDVEN Microbiology No results found for this or any previous visit (from the past 240 hour(s)).   Time coordinating discharge: 45 minutes  SIGNED:   Oswald Hillock, MD  [facility] 03/28/2018, 9:22 PM   If 7PM-7AM, please contact night-coverage [email or web address] Password [email or web address]

## 2018-03-24 NOTE — Procedures (Signed)
I was present at this session.  I have reviewed the session itself and made appropriate changes.  Yelling at nurses, [place].  Will take off HD. Get security. Not full tx  [place] Area 1/18/20204:12 PM

## 2018-03-24 NOTE — Progress Notes (Signed)
[facility] Progress Note   Dialysis Orders:  TTS SW 4h 81min 500/800 88.5kg 2/2.25 LUA AVF Hep 5000 Sensipar 90 venofer 50/wk Mircera 50 every 2 wks, last 12/28  Assessment/Plan: 1.T1DM, uncontrolled w/Hypoglycemia - found down at home, now improved. Per primary- some issues, now hyperglycemia 2. Acute encephalopathy/seizure - 2/2 hypoglycemia & non compliance with meds, improvingbut affect flat, non engaging - don't know his baseline 3. Acute respiratory failure - Improved. Self extubated, breathing stable 4. ESRD -on HD TTS.Doing routine HD while here. HD Saturday  5. Anemia of CKD-Hgb 11.3- now higher ? 14 ? No indication for ESA at this time. -repeat CBC pre HD today 6. Secondary hyperparathyroidism -Ca 8.6, Phos5.8, continue sensipar, binders. Not on VDRA.  P now in goal. 7. HTN/volume -BPoverallimproved . net UF 2.2 Thursday. On bidil and procardia 30 - post wt 88.2 Thursday - possibly more room for volume removal 8. Nutrition -Renal/Carb modifed diet w/fluid restrictions, renavite. Protein supplements. 9. Fever - 100.7 last pm - tx tylenol - afebrile this am  - see how he does on dialysis - if spikes again, needs work up WBC low 1/15 - needs repeat CBC - draw pre HD  Myriam Jacobson, PA-C [place] 03/24/2018,9:11 AM  LOS: 10 days   Subjective:   Denies pain. Poor eye contact. Doesn't recall feeling bad with elevated temp last pm.  Objective Vitals:   03/23/18 2013 03/24/18 0342 03/24/18 0424 03/24/18 0800  BP: 129/65  (!) 151/84 (!) 153/85  Pulse: 94  91 89  Resp: 18  18 17   Temp: 98.6 F (37 C)  99.2 F (37.3 C) 99.4 F (37.4 C)  TempSrc: Oral  Oral Oral  SpO2: 97%  97% 98%  Weight: 88.6 kg 88.6 kg    Height:       Physical Exam General: NAD disengaged Heart: RRR Lungs: no rales Abdomen: soft NT NS + BS Extremities: no sig edema Dialysis Access: left upper AVF + bruit   Additional  Objective Labs: Basic Metabolic Panel: Recent Labs  Lab 03/20/18 0813  03/22/18 0627 03/23/18 0346 03/24/18 0608  NA 134*   < > 136 136 133*  K 5.4*   < > 4.6 4.1 4.9  CL 96*   < > 97* 97* 94*  CO2 22   < > 24 26 23   GLUCOSE 373*   < > 210* 162* 258*  BUN 61*   < > 71* 48* 69*  CREATININE 13.92*   < > 12.50* 10.29* 13.18*  CALCIUM 8.6*   < > 9.1 8.5* 9.2  PHOS 5.8*  --  5.0*  --   --    < > = values in this interval not displayed.   Liver Function Tests: Recent Labs  Lab 03/20/18 0813 03/22/18 0627  ALBUMIN 3.0* 3.1*   No results for input(s): LIPASE, AMYLASE in the last 168 hours. CBC: Recent Labs  Lab 03/20/18 0813 03/22/18 0627  WBC 4.2 2.3*  HGB 11.3* 14.0  HCT 34.1* 42.7  MCV 88.6 85.7  PLT 308 238   Blood Culture    Component Value Date/Time   SDES TRACHEAL ASPIRATE 03/15/2018 0738   SPECREQUEST NONE 03/15/2018 0738   CULT MODERATE Consistent with normal respiratory flora. 03/15/2018 0738   REPTSTATUS 03/17/2018 FINAL 03/15/2018 0738    Cardiac Enzymes: No results for input(s): CKTOTAL, CKMB, CKMBINDEX, TROPONINI in the last 168 hours. CBG: Recent Labs  Lab 03/23/18 2011 03/23/18 2043 03/23/18 2301 03/24/18  0422 03/24/18 0759  GLUCAP 48* 88 288* 264* 241*   Iron Studies: No results for input(s): IRON, TIBC, TRANSFERRIN, FERRITIN in the last 72 hours. Lab Results  Component Value Date   INR 1.00 03/14/2018   INR 1.13 03/06/2017   INR 1.15 03/05/2017   Studies/Results: No results found. Medications:  . calcium acetate  2,001 mg Oral TID WC  . Chlorhexidine Gluconate Cloth  6 each Topical Q0600  . Chlorhexidine Gluconate Cloth  6 each Topical Q0600  . cinacalcet  90 mg Oral Q T,Th,Sa-HD  . feeding supplement (NEPRO CARB STEADY)  237 mL Oral BID BM  . heparin  5,000 Units Subcutaneous Q8H  . insulin aspart  0-9 Units Subcutaneous TID WC  . insulin aspart  5 Units Subcutaneous TID WC  . insulin glargine  10 Units Subcutaneous QHS  .  insulin glargine  12 Units Subcutaneous Daily  . isosorbide-hydrALAZINE  1 tablet Oral TID  . levETIRAcetam  250 mg Oral Q T,Th,Sat-1800  . levETIRAcetam  500 mg Oral Q0600  . mouth rinse  15 mL Mouth Rinse BID  . multivitamin  1 tablet Oral QHS  . NIFEdipine  30 mg Oral BID

## 2018-03-24 NOTE — Progress Notes (Addendum)
Pt agitated and yelling : "Are you doing this everyday?". Dr. Jimmy Footman notified.  Got security. HD treatment terminated , cut off 1 hour. Report given to [employer]. Sent back to his room in stable condition.

## 2018-03-25 DIAGNOSIS — F322 Major depressive disorder, single episode, severe without psychotic features: Secondary | ICD-10-CM

## 2018-03-25 DIAGNOSIS — R45851 Suicidal ideations: Secondary | ICD-10-CM

## 2018-03-25 LAB — GLUCOSE, CAPILLARY
GLUCOSE-CAPILLARY: 311 mg/dL — AB (ref 70–99)
GLUCOSE-CAPILLARY: 327 mg/dL — AB (ref 70–99)
Glucose-Capillary: 114 mg/dL — ABNORMAL HIGH (ref 70–99)
Glucose-Capillary: 74 mg/dL (ref 70–99)
Glucose-Capillary: 209 mg/dL — ABNORMAL HIGH (ref 70–99)
Glucose-Capillary: 166 mg/dL — ABNORMAL HIGH (ref 70–99)

## 2018-03-25 MED ORDER — INSULIN GLARGINE 100 UNIT/ML ~~LOC~~ SOLN
14.0000 [IU] | Freq: Every day | SUBCUTANEOUS | Status: DC
  Administered 2018-03-25 – 2018-03-28 (×4): 14 [IU] via SUBCUTANEOUS
  Filled 2018-03-25 (×4): qty 0.14

## 2018-03-25 MED ORDER — FLUOXETINE HCL 20 MG PO CAPS
20.0000 mg | ORAL_CAPSULE | Freq: Every day | ORAL | Status: DC
  Administered 2018-03-26 – 2018-03-28 (×3): 20 mg via ORAL
  Filled 2018-03-25 (×4): qty 1

## 2018-03-25 NOTE — Progress Notes (Signed)
Patient;s brother ,Vonna Kotyk is in the room.I am sitting by the patient's doorway with full visual of the patient and his brother.

## 2018-03-25 NOTE — Progress Notes (Signed)
   03/24/18 2015  Provider Notification  Provider Name/Title Arby Barrette NP  Date Provider Notified 03/24/18  Time Provider Notified 2015  Notification Type Page  Notification Reason Requested by patient/family  Response Other (Comment) (Discharge cancelled)  Date of Provider Response 03/24/18  Time of Provider Response 2020   At beginning of shift, the patient is in bed with eyes closed.  He will not talk to you when you ask him a question.  He will let you take a blood sugar, VS, etc.  His affect flat and non interactive.  Brother, Vonna Kotyk came to RN and expressed concern about the patient's mental status.  He felt patient is severely depressed and he is worried about patient being discharged now in his current mental state and with his blood sugars being so erratic.  He feels that the patient needs a psychiatric consult before being discharged.  This patient is from home with girlfriend but the brother does not know where the girlfriend is currently.  [facility] made aware of the situation.  Discharge is held tonight.  Will continue to monitor the patient.  Earleen Reaper RN

## 2018-03-25 NOTE — Progress Notes (Signed)
[facility] Progress Note   Dialysis Orders:  TTS SW 4h 37min 500/800 88.5kg 2/2.25 LUA AVF Hep 5000 Sensipar 90 venofer 50/wk Mircera 50 every 2 wks, last 12/28  Assessment/Plan: 1.T1DM, uncontrolled w/Hypoglycemia - found down at home, now improved. Per primary- some issues, still labile but not new for pt 2. Acute encephalopathy/seizure - 2/2 hypoglycemia & non compliance with meds, improvingbut affect flat, non engaging - not too far from norm - with 2 agitation events- now thinking psych component- hoping for inpatient psych eval  3. Acute respiratory failure - Improved.  4. ESRD -on HD TTS.Doing routine HD while here. HD Saturday - next due Tuesday  5. Anemia of CKD-Hgb 11.3- now higher ? 14 ? No indication for ESA at this time.  6. Secondary hyperparathyroidism -Ca 8.6, Phos5.0, continue sensipar, phoslo. Not on VDRA.  P now in goal. 7. HTN/volume -BPoverallimproved . net UF 2.2 Thursday. On bidil and procardia 30 - post wt 88.2 Thursday - possibly more room for volume removal 8. Nutrition -Renal/Carb modifed diet w/fluid restrictions, renavite. Protein supplements. 9. Fever - 100.0 last pm - tx tylenol - afebrile this am  -   Edward Colon  Subjective:   Had discharge summary in chart - went to HD- got agitated again and signed off ?  Ended up staying because family was concerned about his mental status - possible psych consult pending - pt keeps eyes closed today- did eat breakfast  Objective Vitals:   03/24/18 1628 03/24/18 1753 03/24/18 2101 03/25/18 0756  BP: (!) 160/80 (!) 159/77 (!) 153/88 (!) 150/82  Pulse: 85 97 99 81  Resp: 18 18  19   Temp: 98.7 F (37.1 C) 99.2 F (37.3 C) 100 F (37.8 C) 98.2 F (36.8 C)  TempSrc: Oral Oral Oral Oral  SpO2:  99% 98% 99%  Weight:      Height:       Physical Exam General: NAD disengaged Heart: RRR Lungs: no rales Abdomen: soft NT NS + BS Extremities: no sig edema Dialysis  Access: left upper AVF + bruit   Additional Objective Labs: Basic Metabolic Panel: Recent Labs  Lab 03/20/18 0813  03/22/18 0627 03/23/18 0346 03/24/18 0608  NA 134*   < > 136 136 133*  K 5.4*   < > 4.6 4.1 4.9  CL 96*   < > 97* 97* 94*  CO2 22   < > 24 26 23   GLUCOSE 373*   < > 210* 162* 258*  BUN 61*   < > 71* 48* 69*  CREATININE 13.92*   < > 12.50* 10.29* 13.18*  CALCIUM 8.6*   < > 9.1 8.5* 9.2  PHOS 5.8*  --  5.0*  --   --    < > = values in this interval not displayed.   Liver Function Tests: Recent Labs  Lab 03/20/18 0813 03/22/18 0627  ALBUMIN 3.0* 3.1*   No results for input(s): LIPASE, AMYLASE in the last 168 hours. CBC: Recent Labs  Lab 03/20/18 0813 03/22/18 0627  WBC 4.2 2.3*  HGB 11.3* 14.0  HCT 34.1* 42.7  MCV 88.6 85.7  PLT 308 238   Blood Culture    Component Value Date/Time   SDES TRACHEAL ASPIRATE 03/15/2018 0738   SPECREQUEST NONE 03/15/2018 0738   CULT MODERATE Consistent with normal respiratory flora. 03/15/2018 0738   REPTSTATUS 03/17/2018 FINAL 03/15/2018 0738    Cardiac Enzymes: No results for input(s): CKTOTAL, CKMB, CKMBINDEX, TROPONINI in the last 168  hours. CBG: Recent Labs  Lab 03/24/18 1753 03/24/18 2058 03/24/18 2356 03/25/18 0442 03/25/18 0756  GLUCAP 310* 452* 375* 114* 74   Iron Studies: No results for input(s): IRON, TIBC, TRANSFERRIN, FERRITIN in the last 72 hours. Lab Results  Component Value Date   INR 1.00 03/14/2018   INR 1.13 03/06/2017   INR 1.15 03/05/2017   Studies/Results: No results found. Medications:  . calcium acetate  2,001 mg Oral TID WC  . Chlorhexidine Gluconate Cloth  6 each Topical Q0600  . Chlorhexidine Gluconate Cloth  6 each Topical Q0600  . cinacalcet  90 mg Oral Q T,Th,Sa-HD  . feeding supplement (NEPRO CARB STEADY)  237 mL Oral BID BM  . heparin  5,000 Units Subcutaneous Q8H  . insulin aspart  0-9 Units Subcutaneous TID WC  . insulin aspart  5 Units Subcutaneous TID WC  .  insulin glargine  10 Units Subcutaneous QHS  . insulin glargine  12 Units Subcutaneous Daily  . isosorbide-hydrALAZINE  1 tablet Oral TID  . levETIRAcetam  250 mg Oral Q T,Th,Sat-1800  . levETIRAcetam  500 mg Oral Q0600  . mouth rinse  15 mL Mouth Rinse BID  . multivitamin  1 tablet Oral QHS  . NIFEdipine  30 mg Oral BID

## 2018-03-25 NOTE — Consult Note (Signed)
[name]   Reason for Consult:  depression Referring Physician:  Dr. Jimmy Picket Patient Identification: Edward Colon MRN:  [id number] Principal Diagnosis: Major depressive disorder, single episode, severe ([place]) Diagnosis:  Principal Problem:   Major depressive disorder, single episode, severe ([place]) Active Problems:   ESRD on dialysis ([place])   Altered mental status   Diabetic neuropathy ([place])   Blindness of left eye with normal vision in contralateral eye   Agitation   Encephalopathy   Total Time spent with patient: 45 minutes  Subjective:   Edward Colon is a 38 y.o. male patient admitted with altered mental status.  HPI:  Patient with history of type 1 diabetes mellitus, end-stage renal disease on hemodialysis, hypertension and seizures who was brought to the hospital after he was found unresponsive at home. Patient is now reporting depressive symptoms ongoing for the past few weeks. He reports feeling of  hopelessness, worthlessness, lack of motivation, low energy level and suicidal thoughts. He refused to reports prior history of suicide attempts but denies psychosis or delusional thinking. He states that he lives alone and has been overwhelmed with his multiple medical problem with no hope of getting better. Patient is unable to contract for safety.  He denies drugs and alcohol abuse.  Past Psychiatric History: none reported  Risk to Self:  yes, suicidal but will not disclose his plan Risk to Others:  denies Prior Inpatient Therapy:  none Prior Outpatient Therapy:  none  Past Medical History:  Past Medical History:  Diagnosis Date  . Anemia   . Blind left eye since ~ 2010  . Depression   . Diabetic peripheral neuropathy ([place])   . ESRD (end stage renal disease) on dialysis [facility])    "TTS; [place] [facility]; [facility]" (02/01/2016)  . Heart murmur    denies any problems with it  . Hypertension   . Seizures ([place])    "last one was end of  2016; they are related to my diabetes" (02/01/2016)  . Type 1 diabetes ([place]) dx'd 1990    Past Surgical History:  Procedure Laterality Date  . AV FISTULA PLACEMENT Right 03/03/2015   Procedure: RIGHT RADIO-CEPHALIC ARTERIOVENOUS (AV) FISTULA CREATION;  Surgeon: Serafina Mitchell, MD;  Location: [facility] OR;  Service: Vascular;  Laterality: Right;  . AV FISTULA PLACEMENT Left 06/15/2015   Procedure: INSERTION OF LEFT UPPER ARM  ARTERIOVENOUS (AV) 13mm x 50cm GORE-TEX GRAFT;  Surgeon: Elam Dutch, MD;  Location: [place];  Service: Vascular;  Laterality: Left;  . BASCILIC VEIN TRANSPOSITION Left 04/01/2015   Procedure: BASCILIC VEIN TRANSPOSITION-LEFT ARM- FIRST STAGE;  Surgeon: Elam Dutch, MD;  Location: [place];  Service: Vascular;  Laterality: Left;  . EYE SURGERY Right ~ 2010   for diabetic retinopathy  . INSERTION OF DIALYSIS CATHETER Right 03/03/2015   Procedure: INSERTION OF DIALYSIS CATHETER;  Surgeon: Serafina Mitchell, MD;  Location: [facility] OR;  Service: Vascular;  Laterality: Right;   Family History:  Family History  Problem Relation Age of Onset  . Diabetes Neg Hx    Family Psychiatric  History:  Social History:  Social History   Substance and Sexual Activity  Alcohol Use No  . Frequency: Never   Comment: 02/01/2016 "might have 1 drink/year"     Social History   Substance and Sexual Activity  Drug Use No    Social History   Socioeconomic History  . Marital status: Single    Spouse name: Not on file  .  Number of children: Not on file  . Years of education: Not on file  . Highest education level: Not on file  Occupational History  . Not on file  Social Needs  . Financial resource strain: Not on file  . Food insecurity:    Worry: Not on file    Inability: Not on file  . Transportation needs:    Medical: Not on file    Non-medical: Not on file  Tobacco Use  . Smoking status: Never Smoker  . Smokeless tobacco: Never Used  Substance and Sexual Activity  . Alcohol  use: No    Frequency: Never    Comment: 02/01/2016 "might have 1 drink/year"  . Drug use: No  . Sexual activity: Yes  Lifestyle  . Physical activity:    Days per week: Not on file    Minutes per session: Not on file  . Stress: Not on file  Relationships  . Social connections:    Talks on phone: Not on file    Gets together: Not on file    Attends religious service: Not on file    Active member of club or organization: Not on file    Attends meetings of clubs or organizations: Not on file    Relationship status: Not on file  Other Topics Concern  . Not on file  Social History Narrative  . Not on file   Additional Social History:    Allergies:  No Known Allergies  Labs:  Results for orders placed or performed during the hospital encounter of 03/14/18 (from the past 48 hour(s))  Glucose, capillary     Status: Abnormal   Collection Time: 03/23/18  4:12 PM  Result Value Ref Range   Glucose-Capillary 210 (H) 70 - 99 mg/dL  Glucose, capillary     Status: Abnormal   Collection Time: 03/23/18  8:11 PM  Result Value Ref Range   Glucose-Capillary 48 (L) 70 - 99 mg/dL  Glucose, capillary     Status: None   Collection Time: 03/23/18  8:43 PM  Result Value Ref Range   Glucose-Capillary 88 70 - 99 mg/dL  Glucose, capillary     Status: Abnormal   Collection Time: 03/23/18 11:01 PM  Result Value Ref Range   Glucose-Capillary 288 (H) 70 - 99 mg/dL  Glucose, capillary     Status: Abnormal   Collection Time: 03/24/18  4:22 AM  Result Value Ref Range   Glucose-Capillary 264 (H) 70 - 99 mg/dL  Basic metabolic panel     Status: Abnormal   Collection Time: 03/24/18  6:08 AM  Result Value Ref Range   Sodium 133 (L) 135 - 145 mmol/L   Potassium 4.9 3.5 - 5.1 mmol/L   Chloride 94 (L) 98 - 111 mmol/L   CO2 23 22 - 32 mmol/L   Glucose, Bld 258 (H) 70 - 99 mg/dL   BUN 69 (H) 6 - 20 mg/dL   Creatinine, Ser 13.18 (H) 0.61 - 1.24 mg/dL   Calcium 9.2 8.9 - 10.3 mg/dL   GFR calc non Af Amer  4 (L) >60 mL/min   GFR calc Af Amer 5 (L) >60 mL/min   Anion gap 16 (H) 5 - 15    Comment: Performed at [facility], [address] [place], [place] [address]  Glucose, capillary     Status: Abnormal   Collection Time: 03/24/18  7:59 AM  Result Value Ref Range   Glucose-Capillary 241 (H) 70 - 99 mg/dL  Glucose,  capillary     Status: Abnormal   Collection Time: 03/24/18 11:43 AM  Result Value Ref Range   Glucose-Capillary 156 (H) 70 - 99 mg/dL  Glucose, capillary     Status: Abnormal   Collection Time: 03/24/18  5:53 PM  Result Value Ref Range   Glucose-Capillary 310 (H) 70 - 99 mg/dL  Glucose, capillary     Status: Abnormal   Collection Time: 03/24/18  8:58 PM  Result Value Ref Range   Glucose-Capillary 452 (H) 70 - 99 mg/dL  Glucose, capillary     Status: Abnormal   Collection Time: 03/24/18 11:56 PM  Result Value Ref Range   Glucose-Capillary 375 (H) 70 - 99 mg/dL  Glucose, capillary     Status: Abnormal   Collection Time: 03/25/18  4:42 AM  Result Value Ref Range   Glucose-Capillary 114 (H) 70 - 99 mg/dL  Glucose, capillary     Status: None   Collection Time: 03/25/18  7:56 AM  Result Value Ref Range   Glucose-Capillary 74 70 - 99 mg/dL  Glucose, capillary     Status: Abnormal   Collection Time: 03/25/18 11:14 AM  Result Value Ref Range   Glucose-Capillary 311 (H) 70 - 99 mg/dL    Current Facility-Administered Medications  Medication Dose Route Frequency Provider Last Rate Last Dose  . acetaminophen (TYLENOL) tablet 650 mg  650 mg Oral Q4H PRN Lorella Nimrod, MD   650 mg at 03/23/18 1628  . calcium acetate (PHOSLO) capsule 2,001 mg  2,001 mg Oral TID WC Penninger, Lindsay, PA   2,001 mg at 03/25/18 0839  . Chlorhexidine Gluconate Cloth 2 % PADS 6 each  6 each Topical Q0600 Penninger, [place], [place]   6 each at 03/21/18 1205  . Chlorhexidine Gluconate Cloth 2 % PADS 6 each  6 each Topical Q0600 Alric Seton, PA-C   6 each at 03/23/18 1138  . cinacalcet (SENSIPAR)  tablet 90 mg  90 mg Oral Q T,Th,Sa-HD Corliss Parish, MD   90 mg at 03/24/18 1754  . feeding supplement (NEPRO CARB STEADY) liquid 237 mL  237 mL Oral BID BM Modena Jansky, MD   237 mL at 03/23/18 0805  . FLUoxetine (PROZAC) capsule 20 mg  20 mg Oral Daily Joletta Manner, MD      . heparin injection 5,000 Units  5,000 Units Subcutaneous Q8H Lorella Nimrod, MD   5,000 Units at 03/25/18 1432  . insulin aspart (novoLOG) injection 0-9 Units  0-9 Units Subcutaneous TID WC Arrien, Jimmy Picket, MD   9 Units at 03/25/18 1340  . insulin aspart (novoLOG) injection 5 Units  5 Units Subcutaneous TID WC Kirby-Graham, Karsten Fells, NP   5 Units at 03/25/18 1322  . insulin glargine (LANTUS) injection 10 Units  10 Units Subcutaneous QHS Tawni Millers, MD   10 Units at 03/24/18 2252  . insulin glargine (LANTUS) injection 14 Units  14 Units Subcutaneous Daily Arrien, Jimmy Picket, MD      . isosorbide-hydrALAZINE (BIDIL) 20-37.5 MG per tablet 1 tablet  1 tablet Oral TID Modena Jansky, MD   1 tablet at 03/25/18 1343  . levETIRAcetam (KEPPRA) tablet 250 mg  250 mg Oral Q T,Th,Sat-1800 Rigoberto Noel, MD   250 mg at 03/24/18 1756  . levETIRAcetam (KEPPRA) tablet 500 mg  500 mg Oral Q0600 Rigoberto Noel, MD   500 mg at 03/25/18 0839  . MEDLINE mouth rinse  15 mL Mouth Rinse BID Kipp Brood, MD   15  mL at 03/23/18 2323  . multivitamin (RENA-VIT) tablet 1 tablet  1 tablet Oral QHS Penninger, [place], [place]   1 tablet at 03/24/18 2251  . NIFEdipine (PROCARDIA-XL/NIFEDICAL-XL) 24 hr tablet 30 mg  30 mg Oral BID Rigoberto Noel, MD   30 mg at 03/25/18 1423  . ondansetron (ZOFRAN) injection 4 mg  4 mg Intravenous Q6H PRN Lorella Nimrod, MD        Musculoskeletal: Strength & Muscle Tone: within normal limits Gait & Station: normal Patient leans: N/A  Psychiatric Specialty Exam: Physical Exam  Psychiatric: Judgment normal. His affect is blunt. His speech is delayed. He is slowed and withdrawn.  Cognition and memory are normal. He exhibits a depressed mood. He expresses suicidal ideation.    Review of Systems  Constitutional: Positive for malaise/fatigue.  HENT: Negative.   Eyes: Negative.   Respiratory: Negative.   Cardiovascular: Negative.   Gastrointestinal: Negative.   Musculoskeletal: Negative.   Skin: Negative.   Neurological: Negative.   Endo/Heme/Allergies: Negative.   Psychiatric/Behavioral: Positive for depression and suicidal ideas.    Blood pressure (!) 150/82, pulse 81, temperature 98.2 F (36.8 C), temperature source Oral, resp. rate 19, height 6' (1.829 m), weight 89 kg, SpO2 99 %.Body mass index is 26.61 kg/m.  General Appearance: Casual  Eye Contact:  Minimal  Speech:  Clear and Coherent and Slow  Volume:  Decreased  Mood:  Depressed and Dysphoric  Affect:  Constricted  Thought Process:  Coherent and Linear  Orientation:  Full (Time, Place, and Person)  Thought Content:  Logical  Suicidal Thoughts:  Yes.  without intent/plan  Homicidal Thoughts:  No  Memory:  Immediate;   Good Recent;   Good Remote;   Good  Judgement:  Other:  marginal  Insight:  Shallow  Psychomotor Activity:  Psychomotor Retardation  Concentration:  Concentration: Fair and Attention Span: Fair  Recall:  Good  Fund of Knowledge:  Good  Language:  Good  Akathisia:  No  Handed:  Right  AIMS (if indicated):     Assets:  Communication Skills Desire for Improvement  ADL's:  Intact  Cognition:  WNL  Sleep:   fair     Treatment Plan Summary: 38 year old male who was admitted due to altered mentation and hypoglycemia.Patient has been found to be depressed as evidenced by being withdrawn, not showing motivation, low energy level and having suicidal thought. He appears aloof and unable to contract for his safety.  Recommendations: -Consider starting patient on antidepressant-Prozac 20 mg daily to address depression. -Consider 1:1 sitter for safety -Patient will benefit from  inpatient psychiatric admission  after he is medically cleared. -Patient needs to be involuntarily committed if he refuses voluntary psychiatric admission-he is a danger to himself. -Consider social worker consult to facilitate inpatient psychiatric admission. -Psychiatric service signing out, re-consult as needed.   Disposition: Recommend psychiatric Inpatient admission when medically cleared. Supportive therapy provided about ongoing stressors.  Corena Pilgrim, MD 03/25/2018 2:33 PM

## 2018-03-25 NOTE — Progress Notes (Signed)
   03/24/18 2138  Provider Notification  Provider Name/Title Arby Barrette NP  Date Provider Notified 03/24/18  Time Provider Notified 2138  Notification Type Page  Notification Reason Change in status (CBG is 452)  Response See new orders  Date of Provider Response 03/24/18  Time of Provider Response 2145   [facility] made aware of CBG of 452.  12 Units Novolog given.  At 2356, CBG was 375.  Will continue to monitor patient.  Earleen Reaper RN

## 2018-03-25 NOTE — Progress Notes (Signed)
AS per HD nurse report,they have to call security becouse of patient became agitated and verbally abusive and he ended his HD treatment an hour early.I explained to him that he is being disharged and asked him about his ride to home,he said "i'll figure it out''.Nurse being concern with his mental status have to call his father and brother for his ride home.

## 2018-03-25 NOTE — Progress Notes (Signed)
PROGRESS NOTE    Edward Colon  BDZ:[id number] DOB: [birth year] DOA: 03/14/2018 PCP: Nolene Ebbs, MD    Brief Narrative:  38 year old male who presented altered mentation and hypoglycemia. He does have the significant past medical history of type 1 diabetes mellitus, end-stage renal disease on hemodialysis, hypertension and seizures. Patient was found unresponsive at home, his capillary glucose was down to 33, he had a witnessed generalized seizure. He had a poor response to glucagon and dextrose, and required invasive mechanical ventilation. His blood pressure was 137/85, heart rate 78, temperature 93.8, respiratory rate 18, oxygen saturation 100percent on mechanical ventilation.His lungs were clear to auscultation bilaterally, heart S1-S2 present and rhythmic, abdomen soft nontender, no lower extremity edema, no rashes.  Sodium 140, potassium 4.5, chloride 100, bicarb 26, glucose 155, BUN 28, creatinine 10.8, white count 4.5, hemoglobin 12.8, hematocrit 41.6, platelets 313, urinalysis with greater than 300 protein, glucose greater than 500.  Head CT was negative for acute changes.  Chest radiograph with no infiltrates.  His EKG was normal sinus rhythm with a normal axis, poor R wave progression and left ventricular hypertrophy.  Patient was admitted to the intensive care unit with a working diagnosis of metabolic encephalopathy due to severe hypoglycemia complicated by generalized seizures.  Patient was liberated from mechanical ventilation on January 10, transferred to triad hospitalist January 12.    Assessment & Plan:   Active Problems:   ESRD on dialysis [facility])   Altered mental status   Diabetic neuropathy (HCC)   Blindness of left eye with normal vision in contralateral eye   Agitation   Encephalopathy    1.Metabolic encephalopathy due to hypoglycemia. Capillary glucose in am 241, noon 156, pm 452 to 375. Today fasting at 7:56 am down 74. Will further adjust am  insulin to 14 units for now and continue close monitoring. Patient may benefit from insulin pump.   2.Generalized seizures.continue renally dosed Keppra, no acitve seizures.   3.End-stage renal disease on hemodialysis.TTS, patient became very agitated on HD, aggressive. This am he is euvolemic. Will check renal panel in am.   4. Diabetic neuropathy.denies any pain.   5. HTN. Continue blood pressure control with nifedipine, isosorbide and hydralazine.  6. New depression and agitation. Patient not much interactive, agitated on HD, high risk for medical complications. Consulted psych for evaluation, since he is having severe symptoms.   DVT prophylaxis: heparin   Code Status:  full Family Communication: no family at the bedside  Disposition Plan/ discharge barriers: pending psych evaluation.  Body mass index is 26.61 kg/m. Malnutrition Type:  Nutrition Problem: Inadequate oral intake Etiology: acute illness   Malnutrition Characteristics:  Signs/Symptoms: NPO status   Nutrition Interventions:  Interventions: MVI, Nepro shake  RN Pressure Injury Documentation:     Consultants:   Nephrology   Psych   Procedures:     Antimicrobials:       Subjective: Patient had episode of agitation on HD, requested to terminate 1 hour short of planned treatment. Back on the medical ward, was not interactive, nursing concerned about patient's home safety, and compliance. Patient's family reported patient very depressed. Discharge was cancelled due to safety concerns.   Objective: Vitals:   03/24/18 1628 03/24/18 1753 03/24/18 2101 03/25/18 0756  BP: (!) 160/80 (!) 159/77 (!) 153/88 (!) 150/82  Pulse: 85 97 99 81  Resp: 18 18  19   Temp: 98.7 F (37.1 C) 99.2 F (37.3 C) 100 F (37.8 C) 98.2 F (36.8 C)  TempSrc: Oral  Oral Oral Oral  SpO2:  99% 98% 99%  Weight:      Height:        Intake/Output Summary (Last 24 hours) at 03/25/2018 0918 Last data filed  at 03/25/2018 0600 Gross per 24 hour  Intake 360 ml  Output 1500 ml  Net -1140 ml   Filed Weights   03/23/18 2013 03/24/18 0342 03/24/18 1320  Weight: 88.6 kg 88.6 kg 89 kg    Examination:   General: deconditioned  Neurology: somnolent and poorly interactive.  E ENT: mild pallor, no icterus, oral mucosa moist Cardiovascular: No JVD. S1-S2 present, rhythmic, no gallops, rubs, or murmurs. No lower extremity edema. Pulmonary: positive breath sounds bilaterally, adequate air movement, no wheezing, rhonchi or rales. Gastrointestinal. Abdomen with no organomegaly, non tender, no rebound or guarding Skin. No rashes Musculoskeletal: no joint deformities     Data Reviewed: I have personally reviewed following labs and imaging studies  CBC: Recent Labs  Lab 03/20/18 0813 03/22/18 0627  WBC 4.2 2.3*  HGB 11.3* 14.0  HCT 34.1* 42.7  MCV 88.6 85.7  PLT 308 161   Basic Metabolic Panel: Recent Labs  Lab 03/20/18 0813 03/21/18 1449 03/22/18 0627 03/23/18 0346 03/24/18 0608  NA 134* 130* 136 136 133*  K 5.4* 5.3* 4.6 4.1 4.9  CL 96* 91* 97* 97* 94*  CO2 22 24 24 26 23   GLUCOSE 373* 590* 210* 162* 258*  BUN 61* 57* 71* 48* 69*  CREATININE 13.92* 11.23* 12.50* 10.29* 13.18*  CALCIUM 8.6* 8.6* 9.1 8.5* 9.2  PHOS 5.8*  --  5.0*  --   --    GFR: Estimated Creatinine Clearance: 8.6 mL/min (A) (by C-G formula based on SCr of 13.18 mg/dL (H)). Liver Function Tests: Recent Labs  Lab 03/20/18 0813 03/22/18 0627  ALBUMIN 3.0* 3.1*   No results for input(s): LIPASE, AMYLASE in the last 168 hours. No results for input(s): AMMONIA in the last 168 hours. Coagulation Profile: No results for input(s): INR, PROTIME in the last 168 hours. Cardiac Enzymes: No results for input(s): CKTOTAL, CKMB, CKMBINDEX, TROPONINI in the last 168 hours. BNP (last 3 results) No results for input(s): PROBNP in the last 8760 hours. HbA1C: No results for input(s): HGBA1C in the last 72  hours. CBG: Recent Labs  Lab 03/24/18 1753 03/24/18 2058 03/24/18 2356 03/25/18 0442 03/25/18 0756  GLUCAP 310* 452* 375* 114* 74   Lipid Profile: No results for input(s): CHOL, HDL, LDLCALC, TRIG, CHOLHDL, LDLDIRECT in the last 72 hours. Thyroid Function Tests: No results for input(s): TSH, T4TOTAL, FREET4, T3FREE, THYROIDAB in the last 72 hours. Anemia Panel: No results for input(s): VITAMINB12, FOLATE, FERRITIN, TIBC, IRON, RETICCTPCT in the last 72 hours.    Radiology Studies: I have reviewed all of the imaging during this hospital visit personally     Scheduled Meds: . calcium acetate  2,001 mg Oral TID WC  . Chlorhexidine Gluconate Cloth  6 each Topical Q0600  . Chlorhexidine Gluconate Cloth  6 each Topical Q0600  . cinacalcet  90 mg Oral Q T,Th,Sa-HD  . feeding supplement (NEPRO CARB STEADY)  237 mL Oral BID BM  . heparin  5,000 Units Subcutaneous Q8H  . insulin aspart  0-9 Units Subcutaneous TID WC  . insulin aspart  5 Units Subcutaneous TID WC  . insulin glargine  10 Units Subcutaneous QHS  . insulin glargine  12 Units Subcutaneous Daily  . isosorbide-hydrALAZINE  1 tablet Oral TID  . levETIRAcetam  250 mg Oral Q  T,Th,Sat-1800  . levETIRAcetam  500 mg Oral Q0600  . mouth rinse  15 mL Mouth Rinse BID  . multivitamin  1 tablet Oral QHS  . NIFEdipine  30 mg Oral BID   Continuous Infusions:   LOS: 11 days        Gail Creekmore Gerome Apley, MD [facility] Pager [phone]

## 2018-03-26 LAB — GLUCOSE, CAPILLARY
Glucose-Capillary: 121 mg/dL — ABNORMAL HIGH (ref 70–99)
Glucose-Capillary: 134 mg/dL — ABNORMAL HIGH (ref 70–99)
Glucose-Capillary: 143 mg/dL — ABNORMAL HIGH (ref 70–99)
Glucose-Capillary: 227 mg/dL — ABNORMAL HIGH (ref 70–99)
Glucose-Capillary: 256 mg/dL — ABNORMAL HIGH (ref 70–99)
Glucose-Capillary: 70 mg/dL (ref 70–99)

## 2018-03-26 MED ORDER — CHLORHEXIDINE GLUCONATE CLOTH 2 % EX PADS: 6.0000 | MEDICATED_PAD | Freq: Every day | CUTANEOUS | Status: DC

## 2018-03-26 NOTE — Progress Notes (Signed)
Edward Colon is medically stable for discharge to a psychiatric facility.  He will need to continue close follow-up on his capillary glucose, his dialysis schedule is Tuesday Thursday and Saturday.

## 2018-03-26 NOTE — Progress Notes (Signed)
PROGRESS NOTE    Edward Colon  WRU:[id number] DOB: 02/20/1981 DOA: 03/14/2018 PCP: Nolene Ebbs, MD    Brief Narrative:  38 year old male who presented altered mentation and hypoglycemia. He does have the significant past medical history of type 1 diabetes mellitus, end-stage renal disease on hemodialysis, hypertension and seizures. Patient was found unresponsive at home, his capillaryglucose was down to 33, he had a witnessed generalized seizure. He had a poor response to glucagon and dextrose,andrequired invasive mechanical ventilation. His blood pressure was 137/85, heart rate 78, temperature 93.8, respiratory rate18, oxygen saturation 100percent on mechanical ventilation.His lungs wereclear to auscultation bilaterally, heart S1-S2 present and rhythmic, abdomen soft nontender, no lower extremity edema, no rashes. Sodium 140, potassium 4.5, chloride 100, bicarb 26, glucose 155, BUN 28, creatinine 10.8, white count 4.5, hemoglobin 12.8, hematocrit 41.6, platelets 313,urinalysis with greater than 300 protein, glucose greater than 500.Head CT was negative for acute changes. Chest radiograph with no infiltrates. His EKG was normal sinus rhythm with a normal axis, poor R wave progression and left ventricular hypertrophy.  Patient was admitted to the intensive care unit with a working diagnosis of metabolic encephalopathy due to severe hypoglycemia complicated by generalized seizures.  Patient was liberated from mechanical ventilation on January 10, transferred to triad hospitalist January 12.  Patient seen by psychiatry with recommendations for inpatient psych,    Assessment & Plan:   Principal Problem:   Major depressive disorder, single episode, severe (HCC) Active Problems:   ESRD on dialysis ([place])   Altered mental status   Diabetic neuropathy ([place])   Blindness of left eye with normal vision in contralateral eye   Agitation   Encephalopathy  1.Metabolic  encephalopathy due to hypoglycemia. Capillary glucose in am 114,74, noon 311, 327, pm 209 to 116. Patient jad late dosing of am glargine due to refusing insulin. Today glucose more controlled, at noon was 256. Will continue glargine 14 and 10, with pre-meal insulin 5 units, plus sliding scale. Patient is tolerating po well, with no nausea or vomiting.   2.Generalized seizures.On renally dosed Keppra, no acitve seizures.  3.End-stage renal disease on hemodialysis.TTS, Plan for HD in am per nephrology schedule.  4. Diabetic neuropathy. No active pain.   5. HTN.Blood pressure control with nifedipine, isosorbide and hydralazine.  6. New depression and agitation. Patient was determined to be not safe for him and others, currently with one to one sitter, will need inpatient psych, patient is medically stable for transfer, social services have been consulted.    DVT prophylaxis:heparin Code Status:full Family Communication:no family at the bedside Disposition Plan/ discharge barriers:pending inpatient psych placement.   Body mass index is 26.67 kg/m. Malnutrition Type:  Nutrition Problem: Inadequate oral intake Etiology: acute illness   Malnutrition Characteristics:  Signs/Symptoms: NPO status   Nutrition Interventions:  Interventions: MVI, Nepro shake  RN Pressure Injury Documentation:     Consultants:   Psych  Nephrology   Procedures:     Antimicrobials:       Subjective: Patient not much interactive this am, no nausea or vomiting, no dyspnea or chest pain.   Objective: Vitals:   03/26/18 0021 03/26/18 0402 03/26/18 0500 03/26/18 0750  BP: 132/85 (!) 143/88  139/74  Pulse: 81 75  70  Resp: 16 16  18   Temp: 98.2 F (36.8 C) 97.7 F (36.5 C)  98.3 F (36.8 C)  TempSrc: Oral Oral  Oral  SpO2: 99% 99%  100%  Weight:   89.2 kg   Height:  Intake/Output Summary (Last 24 hours) at 03/26/2018 1013 Last data filed at 03/26/2018  0751 Gross per 24 hour  Intake 360 ml  Output 0 ml  Net 360 ml   Filed Weights   03/24/18 1320 03/25/18 2144 03/26/18 0500  Weight: 89 kg 89.2 kg 89.2 kg    Examination:   General: Not in pain or dyspnea, deconditioned  Neurology: Awake and alert, non focal/ flat affect.   E ENT: no pallor, no icterus, oral mucosa moist Cardiovascular: No JVD. S1-S2 present, rhythmic, no gallops, rubs, or murmurs. No lower extremity edema. Pulmonary: vesicular breath sounds bilaterally, adequate air movement, no wheezing, rhonchi or rales. Gastrointestinal. Abdomen with, no organomegaly, non tender, no rebound or guarding Skin. No rashes Musculoskeletal: no joint deformities     Data Reviewed: I have personally reviewed following labs and imaging studies  CBC: Recent Labs  Lab 03/20/18 0813 03/22/18 0627  WBC 4.2 2.3*  HGB 11.3* 14.0  HCT 34.1* 42.7  MCV 88.6 85.7  PLT 308 937   Basic Metabolic Panel: Recent Labs  Lab 03/20/18 0813 03/21/18 1449 03/22/18 0627 03/23/18 0346 03/24/18 0608  NA 134* 130* 136 136 133*  K 5.4* 5.3* 4.6 4.1 4.9  CL 96* 91* 97* 97* 94*  CO2 22 24 24 26 23   GLUCOSE 373* 590* 210* 162* 258*  BUN 61* 57* 71* 48* 69*  CREATININE 13.92* 11.23* 12.50* 10.29* 13.18*  CALCIUM 8.6* 8.6* 9.1 8.5* 9.2  PHOS 5.8*  --  5.0*  --   --    GFR: Estimated Creatinine Clearance: 8.6 mL/min (A) (by C-G formula based on SCr of 13.18 mg/dL (H)). Liver Function Tests: Recent Labs  Lab 03/20/18 0813 03/22/18 0627  ALBUMIN 3.0* 3.1*   No results for input(s): LIPASE, AMYLASE in the last 168 hours. No results for input(s): AMMONIA in the last 168 hours. Coagulation Profile: No results for input(s): INR, PROTIME in the last 168 hours. Cardiac Enzymes: No results for input(s): CKTOTAL, CKMB, CKMBINDEX, TROPONINI in the last 168 hours. BNP (last 3 results) No results for input(s): PROBNP in the last 8760 hours. HbA1C: No results for input(s): HGBA1C in the last 72  hours. CBG: Recent Labs  Lab 03/25/18 1929 03/25/18 2143 03/26/18 0022 03/26/18 0432 03/26/18 0742  GLUCAP 209* 166* 121* 70 134*   Lipid Profile: No results for input(s): CHOL, HDL, LDLCALC, TRIG, CHOLHDL, LDLDIRECT in the last 72 hours. Thyroid Function Tests: No results for input(s): TSH, T4TOTAL, FREET4, T3FREE, THYROIDAB in the last 72 hours. Anemia Panel: No results for input(s): VITAMINB12, FOLATE, FERRITIN, TIBC, IRON, RETICCTPCT in the last 72 hours.    Radiology Studies: I have reviewed all of the imaging during this hospital visit personally     Scheduled Meds: . calcium acetate  2,001 mg Oral TID WC  . Chlorhexidine Gluconate Cloth  6 each Topical Q0600  . Chlorhexidine Gluconate Cloth  6 each Topical Q0600  . cinacalcet  90 mg Oral Q T,Th,Sa-HD  . feeding supplement (NEPRO CARB STEADY)  237 mL Oral BID BM  . FLUoxetine  20 mg Oral Daily  . heparin  5,000 Units Subcutaneous Q8H  . insulin aspart  0-9 Units Subcutaneous TID WC  . insulin aspart  5 Units Subcutaneous TID WC  . insulin glargine  10 Units Subcutaneous QHS  . insulin glargine  14 Units Subcutaneous Daily  . isosorbide-hydrALAZINE  1 tablet Oral TID  . levETIRAcetam  250 mg Oral Q T,Th,Sat-1800  . levETIRAcetam  500 mg  Oral Q0600  . mouth rinse  15 mL Mouth Rinse BID  . multivitamin  1 tablet Oral QHS  . NIFEdipine  30 mg Oral BID   Continuous Infusions:   LOS: 12 days         Gerome Apley, MD [facility] Pager [phone]

## 2018-03-26 NOTE — Progress Notes (Addendum)
[facility] Progress Note   Dialysis Orders: TTS SW 4h 63min 500/800 88.5kg 2/2.25 LUA AVF Hep 5000 Sensipar 90 venofer 50/wk Mircera 50 every 2 wks, last 12/28  Assessment/Plan: 1.T1DM, uncontrolled w/Hypoglycemia - found down at home, now improved. Per primary- some issues, still labile but not new for pt 2. Acute encephalopathy/seizure - 2/2 hypoglycemia & non compliance with meds, improvingbut affect flat, non engaging - not too far from norm- on keppra 3. Major depressive disorder - 2 episodes of agitation in hospital - had not happened before at his home HD unit; seen by psych yesterday starting on prozac - sitter for suicide precautions- medically stable to transfer to inpatient psych 4. Acute respiratory failure - Improved.  5. ESRD -on HD TTS.Doing routine HD while here.HD Saturday- next due Tuesday  6. Anemia of CKD-Hgb 11.3- now higher ?14 ?No indication for ESA at this time. Recheck Tuesday 7. Secondary hyperparathyroidism -Ca 8.6, Phos5.0, continue sensipar, phoslo. Not on VDRA.P now in goal. 8. HTN/volume -BPoverallimproved . net UF 2.2 Thursday and 1.5 Saturday  (had short treatment due to outburst) . Onbidil and procardia30 - post wt 88.2 Thursday- possibly more room for volume removal- likely losing weight with poor intake 9. Nutrition -Renal/Carb modifed diet w/fluid restrictions, renavite. Protein supplements. Intake marginal   [place] 03/26/2018,10:32 AM  LOS: 12 days    Pt seen, examined and agree w A/P as above.  Kelly Splinter MD [facility] pager [phone]   03/26/2018, 11:58 AM      Subjective:   Reports HA, no visual changes no N, V.  Objective Vitals:   03/26/18 0021 03/26/18 0402 03/26/18 0500 03/26/18 0750  BP: 132/85 (!) 143/88  139/74  Pulse: 81 75  70  Resp: 16 16  18   Temp: 98.2 F (36.8 C) 97.7 F (36.5 C)  98.3 F  (36.8 C)  TempSrc: Oral Oral  Oral  SpO2: 99% 99%  100%  Weight:   89.2 kg   Height:       Physical Exam General: resting in bed eyes closed, minimal repsonses to questions - mostly moves his hands in response Heart: RRR Lungs: grossly clear Abdomen: soft NT Extremities: no LE edema Dialysis Access:  Left AVF +  bruit   Additional Objective Labs: Basic Metabolic Panel: Recent Labs  Lab 03/20/18 0813  03/22/18 0627 03/23/18 0346 03/24/18 0608  NA 134*   < > 136 136 133*  K 5.4*   < > 4.6 4.1 4.9  CL 96*   < > 97* 97* 94*  CO2 22   < > 24 26 23   GLUCOSE 373*   < > 210* 162* 258*  BUN 61*   < > 71* 48* 69*  CREATININE 13.92*   < > 12.50* 10.29* 13.18*  CALCIUM 8.6*   < > 9.1 8.5* 9.2  PHOS 5.8*  --  5.0*  --   --    < > = values in this interval not displayed.   Liver Function Tests: Recent Labs  Lab 03/20/18 0813 03/22/18 0627  ALBUMIN 3.0* 3.1*   No results for input(s): LIPASE, AMYLASE in the last 168 hours. CBC: Recent Labs  Lab 03/20/18 0813 03/22/18 0627  WBC 4.2 2.3*  HGB 11.3* 14.0  HCT 34.1* 42.7  MCV 88.6 85.7  PLT 308 238   Blood Culture    Component Value Date/Time   SDES TRACHEAL ASPIRATE 03/15/2018 0738   SPECREQUEST NONE  03/15/2018 0738   CULT MODERATE Consistent with normal respiratory flora. 03/15/2018 0738   REPTSTATUS 03/17/2018 FINAL 03/15/2018 0738    Cardiac Enzymes: No results for input(s): CKTOTAL, CKMB, CKMBINDEX, TROPONINI in the last 168 hours. CBG: Recent Labs  Lab 03/25/18 1929 03/25/18 2143 03/26/18 0022 03/26/18 0432 03/26/18 0742  GLUCAP 209* 166* 121* 70 134*   Iron Studies: No results for input(s): IRON, TIBC, TRANSFERRIN, FERRITIN in the last 72 hours. Lab Results  Component Value Date   INR 1.00 03/14/2018   INR 1.13 03/06/2017   INR 1.15 03/05/2017   Studies/Results: No results found. Medications:  . calcium acetate  2,001 mg Oral TID WC  . Chlorhexidine Gluconate Cloth  6 each Topical Q0600  .  Chlorhexidine Gluconate Cloth  6 each Topical Q0600  . cinacalcet  90 mg Oral Q T,Th,Sa-HD  . feeding supplement (NEPRO CARB STEADY)  237 mL Oral BID BM  . FLUoxetine  20 mg Oral Daily  . heparin  5,000 Units Subcutaneous Q8H  . insulin aspart  0-9 Units Subcutaneous TID WC  . insulin aspart  5 Units Subcutaneous TID WC  . insulin glargine  10 Units Subcutaneous QHS  . insulin glargine  14 Units Subcutaneous Daily  . isosorbide-hydrALAZINE  1 tablet Oral TID  . levETIRAcetam  250 mg Oral Q T,Th,Sat-1800  . levETIRAcetam  500 mg Oral Q0600  . mouth rinse  15 mL Mouth Rinse BID  . multivitamin  1 tablet Oral QHS  . NIFEdipine  30 mg Oral BID

## 2018-03-26 NOTE — Clinical Social Work Note (Signed)
Consult received for psych placement and patient medically stable for discharge per MD. The following facilities were contacted: 1. [facility] - faxed clinicals [phone] 2. [facility] - Intake person was not available   3.[facility] - Patient must be involuntary before referral can be made 4. [facility] not take HD patients 5. [facility] - Referral sent 6. [facility] - faxed clinicals CSW will continue search for a psych facility  Naren Benally Givens, MSW, [place] Licensed Clinical Social Worker [place] [phone]

## 2018-03-27 LAB — BASIC METABOLIC PANEL
Anion gap: 17 — ABNORMAL HIGH (ref 5–15)
BUN: 91 mg/dL — ABNORMAL HIGH (ref 6–20)
CO2: 24 mmol/L (ref 22–32)
Calcium: 9.1 mg/dL (ref 8.9–10.3)
Chloride: 96 mmol/L — ABNORMAL LOW (ref 98–111)
Creatinine, Ser: 17.37 mg/dL — ABNORMAL HIGH (ref 0.61–1.24)
GFR calc Af Amer: 4 mL/min — ABNORMAL LOW (ref >60)
GFR calc non Af Amer: 3 mL/min — ABNORMAL LOW (ref >60)
Glucose, Bld: 39 mg/dL — CL (ref 70–99)
Potassium: 4.3 mmol/L (ref 3.5–5.1)
Sodium: 137 mmol/L (ref 135–145)

## 2018-03-27 LAB — GLUCOSE, CAPILLARY
GLUCOSE-CAPILLARY: 104 mg/dL — AB (ref 70–99)
Glucose-Capillary: 297 mg/dL — ABNORMAL HIGH (ref 70–99)
Glucose-Capillary: 360 mg/dL — ABNORMAL HIGH (ref 70–99)
Glucose-Capillary: 39 mg/dL — CL (ref 70–99)
Glucose-Capillary: 433 mg/dL — ABNORMAL HIGH (ref 70–99)
Glucose-Capillary: 45 mg/dL — ABNORMAL LOW (ref 70–99)
Glucose-Capillary: 99 mg/dL (ref 70–99)

## 2018-03-27 LAB — CBC
HCT: 32.3 % — ABNORMAL LOW (ref 39.0–52.0)
Hemoglobin: 10.8 g/dL — ABNORMAL LOW (ref 13.0–17.0)
MCH: 28.9 pg (ref 26.0–34.0)
MCHC: 33.4 g/dL (ref 30.0–36.0)
MCV: 86.4 fL (ref 80.0–100.0)
Platelets: 309 10*3/uL (ref 150–400)
RBC: 3.74 MIL/uL — ABNORMAL LOW (ref 4.22–5.81)
RDW: 12.9 % (ref 11.5–15.5)
WBC: 4.7 10*3/uL (ref 4.0–10.5)
nRBC: 0 % (ref 0.0–0.2)

## 2018-03-27 MED ORDER — ALTEPLASE 2 MG IJ SOLR: 2.0000 mg | Freq: Once | INTRAMUSCULAR | Status: DC | PRN

## 2018-03-27 MED ORDER — SODIUM CHLORIDE 0.9 % IV SOLN: 100.0000 mL | INTRAVENOUS | Status: DC | PRN

## 2018-03-27 MED ORDER — DEXTROSE 50 % IV SOLN
INTRAVENOUS | Status: AC
  Filled 2018-03-27: qty 50

## 2018-03-27 MED ORDER — HEPARIN SODIUM (PORCINE) 1000 UNIT/ML DIALYSIS: 1000.0000 [IU] | INTRAMUSCULAR | Status: DC | PRN

## 2018-03-27 MED ORDER — INSULIN ASPART 100 UNIT/ML ~~LOC~~ SOLN
12.0000 [IU] | Freq: Once | SUBCUTANEOUS | Status: AC
  Administered 2018-03-27: 12 [IU] via SUBCUTANEOUS

## 2018-03-27 MED ORDER — LIDOCAINE HCL (PF) 1 % IJ SOLN: 5.0000 mL | INTRAMUSCULAR | Status: DC | PRN

## 2018-03-27 MED ORDER — HEPARIN SODIUM (PORCINE) 1000 UNIT/ML DIALYSIS
20.0000 [IU]/kg | INTRAMUSCULAR | Status: DC | PRN
  Administered 2018-03-27: 1800 [IU] via INTRAVENOUS_CENTRAL

## 2018-03-27 MED ORDER — INSULIN GLARGINE 100 UNIT/ML ~~LOC~~ SOLN
8.0000 [IU] | Freq: Every day | SUBCUTANEOUS | Status: DC
  Administered 2018-03-27 – 2018-03-28 (×2): 8 [IU] via SUBCUTANEOUS
  Filled 2018-03-27 (×2): qty 0.08

## 2018-03-27 MED ORDER — DEXTROSE 50 % IV SOLN
50.0000 mL | Freq: Once | INTRAVENOUS | Status: AC
  Administered 2018-03-27: 25 mL via INTRAVENOUS

## 2018-03-27 MED ORDER — HEPARIN SODIUM (PORCINE) 1000 UNIT/ML IJ SOLN
INTRAMUSCULAR | Status: AC
  Administered 2018-03-27: 1800 [IU] via INTRAVENOUS_CENTRAL
  Filled 2018-03-27: qty 2

## 2018-03-27 MED ORDER — PENTAFLUOROPROP-TETRAFLUOROETH EX AERO: 1.0000 "application " | INHALATION_SPRAY | CUTANEOUS | Status: DC | PRN

## 2018-03-27 MED ORDER — LIDOCAINE-PRILOCAINE 2.5-2.5 % EX CREA: 1.0000 "application " | TOPICAL_CREAM | CUTANEOUS | Status: DC | PRN

## 2018-03-27 NOTE — Progress Notes (Signed)
PROGRESS NOTE    STANISLAW ACTON  QMV:[id number] DOB: Mar 23, 1980 DOA: 03/14/2018 PCP: Nolene Ebbs, MD    Brief Narrative:  38 year old male who presented altered mentation and hypoglycemia. He does have the significant past medical history of type 1 diabetes mellitus, end-stage renal disease on hemodialysis, hypertension and seizures. Patient was found unresponsive at home, his capillaryglucose was down to 33, he had a witnessed generalized seizure. He had a poor response to glucagon and dextrose,andrequired invasive mechanical ventilation. His blood pressure was 137/85, heart rate 78, temperature 93.8, respiratory rate18, oxygen saturation 100percent on mechanical ventilation.His lungs wereclear to auscultation bilaterally, heart S1-S2 present and rhythmic, abdomen soft nontender, no lower extremity edema, no rashes. Sodium 140, potassium 4.5, chloride 100, bicarb 26, glucose 155, BUN 28, creatinine 10.8, white count 4.5, hemoglobin 12.8, hematocrit 41.6, platelets 313,urinalysis with greater than 300 protein, glucose greater than 500.Head CT was negative for acute changes. Chest radiograph with no infiltrates. His EKG was normal sinus rhythm with a normal axis, poor R wave progression and left ventricular hypertrophy.  Patient was admitted to the intensive care unit with a working diagnosis of metabolic encephalopathy due to severe hypoglycemia complicated by generalized seizures.  Patient was liberated from mechanical ventilation on January 10, transferred to [facility] January 12.  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 Capillary glucose in am 134, noon 356, pm 227 and eveninng 143, Had hypoglycemia early this am 45 and 39. This am 104 at 0624. Will reduce pm insulin to 8 units and will continue 14 units qam, continue with pre-meal short acting insulin and sliding scale. Continue close glucose monitoring. Patient had HD today.   2.Generalized seizures.Continue withKeppra, no acitveseizures.  3.End-stage renal disease on hemodialysis.TTS, Had HD today with no major complications. Clinically euvoleimic.   4. Diabetic neuropathy. Currently with no active pain.   5. HTN.Blood pressure control with nifedipine, isosorbide and hydralazine.  6. New depression and agitation. Continue one to one precautions, pending placement at a psych unit.   DVT prophylaxis:heparin Code Status:full Family Communication:no family at the bedside Disposition Plan/ discharge barriers:pending inpatient psych placement  Body mass index is 26.1 kg/m. Malnutrition Type:  Nutrition Problem: Inadequate oral intake Etiology: acute illness   Malnutrition Characteristics:  Signs/Symptoms: NPO status   Nutrition Interventions:  Interventions: MVI, Nepro shake  RN Pressure Injury Documentation:     Consultants:     Procedures:     Antimicrobials:       Subjective: Patient is more awake today, tolerating po well, no nausea or vomiting.   Objective: Vitals:   03/27/18 1100 03/27/18 1130 03/27/18 1200 03/27/18 1210  BP: (!) 166/99 133/66 (!) 154/92 (!) 143/83  Pulse: 96 95 95 91  Resp:    (!) 22  Temp:    98 F (36.7 C)  TempSrc:    Oral  SpO2:    100%  Weight:    87.3 kg  Height:        Intake/Output Summary (Last 24 hours) at 03/27/2018 1246 Last data filed at 03/27/2018 1210 Gross per 24 hour  Intake 240 ml  Output 2500  ml  Net -2260 ml   Filed Weights   03/26/18 1729 03/27/18 0750 03/27/18 1210  Weight: 91.5 kg 89.7 kg 87.3 kg     Examination:   General: deconditioned Neurology: Awake and alert, non focal  E ENT: no pallor, no icterus, oral mucosa moist Cardiovascular: No JVD. S1-S2 present, rhythmic, no gallops, rubs, or murmurs. No lower extremity edema. Pulmonary: vesicular breath sounds bilaterally, adequate air movement, no wheezing, rhonchi or rales. Gastrointestinal. Abdomen with no organomegaly, non tender, no rebound or guarding Skin. No rashes Musculoskeletal: no joint deformities     Data Reviewed: I have personally reviewed following labs and imaging studies  CBC: Recent Labs  Lab 03/22/18 0627 03/27/18 0740  WBC 2.3* 4.7  HGB 14.0 10.8*  HCT 42.7 32.3*  MCV 85.7 86.4  PLT 238 914   Basic Metabolic Panel: Recent Labs  Lab 03/21/18 1449 03/22/18 0627 03/23/18 0346 03/24/18 0608 03/27/18 0434  NA 130* 136 136 133* 137  K 5.3* 4.6 4.1 4.9 4.3  CL 91* 97* 97* 94* 96*  CO2 24 24 26 23 24   GLUCOSE 590* 210* 162* 258* 39*  BUN 57* 71* 48* 69* 91*  CREATININE 11.23* 12.50* 10.29* 13.18* 17.37*  CALCIUM 8.6* 9.1 8.5* 9.2 9.1  PHOS  --  5.0*  --   --   --    GFR: Estimated Creatinine Clearance: 6.5 mL/min (A) (by C-G formula based on SCr of 17.37 mg/dL (H)). Liver Function Tests: Recent Labs  Lab 03/22/18 0627  ALBUMIN 3.1*   No results for input(s): LIPASE, AMYLASE in the last 168 hours. No results for input(s): AMMONIA in the last 168 hours. Coagulation Profile: No results for input(s): INR, PROTIME in the last 168 hours. Cardiac Enzymes: No results for input(s): CKTOTAL, CKMB, CKMBINDEX, TROPONINI in the last 168 hours. BNP (last 3 results) No results for input(s): PROBNP in the last 8760 hours. HbA1C: No results for input(s): HGBA1C in the last 72 hours. CBG: Recent Labs  Lab 03/26/18 1719 03/26/18 2110 03/27/18 0039 03/27/18 0554 03/27/18 0625  GLUCAP 227* 143* 45* 39* 104*   Lipid Profile: No results for input(s): CHOL, HDL, LDLCALC, TRIG, CHOLHDL,  LDLDIRECT in the last 72 hours. Thyroid Function Tests: No results for input(s): TSH, T4TOTAL, FREET4, T3FREE, THYROIDAB in the last 72 hours. Anemia Panel: No results for input(s): VITAMINB12, FOLATE, FERRITIN, TIBC, IRON, RETICCTPCT in the last 72 hours.    Radiology Studies: I have reviewed all of the imaging during this hospital visit personally     Scheduled Meds: . calcium acetate  2,001 mg Oral TID WC  . Chlorhexidine Gluconate Cloth  6 each Topical Q0600  . cinacalcet  90 mg Oral Q T,Th,Sa-HD  . feeding supplement (NEPRO CARB STEADY)  237 mL Oral BID BM  . FLUoxetine  20 mg Oral Daily  . heparin  5,000 Units Subcutaneous Q8H  . insulin aspart  0-9 Units Subcutaneous TID WC  . insulin aspart  5 Units Subcutaneous TID WC  . insulin glargine  10 Units Subcutaneous QHS  . insulin glargine  14 Units Subcutaneous Daily  . isosorbide-hydrALAZINE  1 tablet Oral TID  . levETIRAcetam  250 mg Oral Q T,Th,Sat-1800  . levETIRAcetam  500 mg Oral Q0600  . mouth rinse  15 mL Mouth Rinse BID  . multivitamin  1 tablet Oral QHS  . NIFEdipine  30 mg Oral BID   Continuous Infusions:   LOS: 13 days        Amond Speranza  Gerome Apley, MD [facility] Pager [phone]

## 2018-03-27 NOTE — Progress Notes (Addendum)
[facility] Progress Note   Dialysis Orders: TTS SW 4h 48min 500/800 88.5kg 2/2.25 LUA AVF Hep 5000 Sensipar 90 venofer 50/wk Mircera 50 every 2 wks, last 12/28  Assessment/Plan: 1.T1DM, uncontrolled w/Hypoglycemia - found down at home, now improved. Per primary- some issues, still labile but not new for pt 2. Acute encephalopathy/seizure - 2/2 hypoglycemia & non compliance with meds, improvingbut affect flat, non engaging - not too far from norm- on keppra 3. Major depressive disorder - 2 episodes of agitation in hospital - had not happened before at his home HD unit; seen by psych yesterday starting on prozac - sitter for suicide precautions- medically stable to transfer to inpatient psych 4. Acute respiratory failure - Improved.  5. ESRD -on HD TTS. 4.3 BUN/Cr high due to early termination of treatment Saturday Qb 450 today 6. Anemia of CKD-Hgb 11.3-> 10.8 No indication for ESA at this time. Continue to trend 7. Secondary hyperparathyroidism -Ca 8.6, Phos5.0, continue sensipar, phoslo. Not on VDRA.P now in goal. 8. HTN/volume -goal 3 L BP up some on HD but improved overall. Some weight loss due to marginal intake 9. Nutrition -Renal/Carb modifed diet w/fluid restrictions, renavite. Protein supplements. Intake marginal  [place] 03/27/2018,9:29 AM  LOS: 13 days    Pt seen, examined and agree w A/P as above.  Kelly Splinter MD [employer] pager [phone]   03/27/2018, 11:40 AM    Subjective:   On HD - observed with sitter - much more talkative with her and some eye contact today. Ate about half of breakfast sandwich  Objective Vitals:   03/27/18 0755 03/27/18 0800 03/27/18 0830 03/27/18 0900  BP: (!) 153/82 125/80 (!) 156/93 (!) 157/90  Pulse: 81 80 83 87  Resp:      Temp:      TempSrc:      SpO2:      Weight:      Height:       Physical Exam goal 3 L   General: + eye contact with a hint of a smile. Responding to questions. Heart: RRR Lungs: grossly clear Abdomen: soft NT Extremities: no LE edema Dialysis Access:  Left AVF Qb 450  Additional Objective Labs: Basic Metabolic Panel: Recent Labs  Lab 03/22/18 0627 03/23/18 0346 03/24/18 0608 03/27/18 0434  NA 136 136 133* 137  K 4.6 4.1 4.9 4.3  CL 97* 97* 94* 96*  CO2 24 26 23 24   GLUCOSE 210* 162* 258* 39*  BUN 71* 48* 69* 91*  CREATININE 12.50* 10.29* 13.18* 17.37*  CALCIUM 9.1 8.5* 9.2 9.1  PHOS 5.0*  --   --   --    Liver Function Tests: Recent Labs  Lab 03/22/18 0627  ALBUMIN 3.1*   CBC: Recent Labs  Lab 03/22/18 0627 03/27/18 0740  WBC 2.3* 4.7  HGB 14.0 10.8*  HCT 42.7 32.3*  MCV 85.7 86.4  PLT 238 309   Blood Culture    Component Value Date/Time   SDES TRACHEAL ASPIRATE 03/15/2018 0738   SPECREQUEST NONE 03/15/2018 0738   CULT MODERATE Consistent with normal respiratory flora. 03/15/2018 0738   REPTSTATUS 03/17/2018 FINAL 03/15/2018 0738    Cardiac Enzymes: No results for input(s): CKTOTAL, CKMB, CKMBINDEX, TROPONINI in the last 168 hours. CBG: Recent Labs  Lab 03/26/18 1719 03/26/18 2110 03/27/18 0039 03/27/18 0554 03/27/18 0625  GLUCAP 227* 143* 45* 39* 104*   Iron Studies: No results for input(s): IRON, TIBC, TRANSFERRIN, FERRITIN in  the last 72 hours. Lab Results  Component Value Date   INR 1.00 03/14/2018   INR 1.13 03/06/2017   INR 1.15 03/05/2017   Studies/Results: No results found. Medications: . sodium chloride    . sodium chloride     . calcium acetate  2,001 mg Oral TID WC  . Chlorhexidine Gluconate Cloth  6 each Topical Q0600  . cinacalcet  90 mg Oral Q T,Th,Sa-HD  . feeding supplement (NEPRO CARB STEADY)  237 mL Oral BID BM  . FLUoxetine  20 mg Oral Daily  . heparin  5,000 Units Subcutaneous Q8H  . insulin aspart  0-9 Units Subcutaneous TID WC  . insulin aspart  5 Units Subcutaneous TID WC  . insulin glargine   10 Units Subcutaneous QHS  . insulin glargine  14 Units Subcutaneous Daily  . isosorbide-hydrALAZINE  1 tablet Oral TID  . levETIRAcetam  250 mg Oral Q T,Th,Sat-1800  . levETIRAcetam  500 mg Oral Q0600  . mouth rinse  15 mL Mouth Rinse BID  . multivitamin  1 tablet Oral QHS  . NIFEdipine  30 mg Oral BID

## 2018-03-27 NOTE — Progress Notes (Signed)
Inpatient Diabetes Program Recommendations  AACE/ADA: New Consensus Statement on Inpatient Glycemic Control (2015)  Target Ranges:  Prepandial:   less than 140 mg/dL      Peak postprandial:   less than 180 mg/dL (1-2 hours)      Critically ill patients:  140 - 180 mg/dL   Lab Results  Component Value Date   GLUCAP 104 (H) 03/27/2018   HGBA1C 9.1 (H) 02/13/2018    Review of Glycemic Control Results for Edward Colon, Edward Colon (MRN [id number]) as of 03/27/2018 08:53  Ref. Range 03/26/2018 21:10 03/27/2018 00:39 03/27/2018 05:54 03/27/2018 06:25  Glucose-Capillary Latest Ref Range: 70 - 99 mg/dL 143 (H) 45 (L) 39 (LL) 104 (H)   Diabetes history: DM 1 Outpatient Diabetes medications: Lantus 10 units q HS Current orders for Inpatient glycemic control:  Novolog sensitive TID, Lantus 10 units QHS, Lantus 14 units QD, Novolog 5 units TID  Inpatient Diabetes Program Recommendations:  Consider decreasing Lantus to 6 units QHS. Do feel that the split doses of Lantus have been helpful throughout the hospitalization course.  Noted that HD was terminated early on 1/18 and renal status this AM is increased, possibly contributing to hypoglycemia this AM of 39 mg/dL.   Thanks, Bronson Curb, MSN, RNC-OB Diabetes Coordinator [phone] (8a-5p)

## 2018-03-27 NOTE — Clinical Social Work Note (Signed)
Search continued contact with psych facilities in search of placement for patient.  Clinicals sent to: [facility] made with [facility] and [facility] and they do not take HD patients. Waiting from call backs from [facility], and [facility]. [facility] does not take HD patient and [facility] has not beds today. CSW will continue search for psych for patient.  Izzie Geers Givens, MSW, LCSW Licensed Clinical Social Worker [place] [phone]

## 2018-03-28 ENCOUNTER — Inpatient Hospital Stay
Admission: AD | Admit: 2018-03-28 | Discharge: 2018-04-11 | DRG: 885 | Disposition: A | Discharge status: Home / Self Care | Payer: Medicare Other | Source: Intra-hospital | Attending: Psychiatry | Admitting: Psychiatry

## 2018-03-28 DIAGNOSIS — R632 Polyphagia: Secondary | ICD-10-CM | POA: Diagnosis present

## 2018-03-28 DIAGNOSIS — N186 End stage renal disease: Secondary | ICD-10-CM | POA: Diagnosis present

## 2018-03-28 DIAGNOSIS — I120 Hypertensive chronic kidney disease with stage 5 chronic kidney disease or end stage renal disease: Secondary | ICD-10-CM | POA: Diagnosis not present

## 2018-03-28 DIAGNOSIS — Z992 Dependence on renal dialysis: Secondary | ICD-10-CM

## 2018-03-28 DIAGNOSIS — G40909 Epilepsy, unspecified, not intractable, without status epilepticus: Secondary | ICD-10-CM | POA: Diagnosis present

## 2018-03-28 DIAGNOSIS — N179 Acute kidney failure, unspecified: Secondary | ICD-10-CM | POA: Diagnosis present

## 2018-03-28 DIAGNOSIS — Z794 Long term (current) use of insulin: Secondary | ICD-10-CM

## 2018-03-28 DIAGNOSIS — I132 Hypertensive heart and chronic kidney disease with heart failure and with stage 5 chronic kidney disease, or end stage renal disease: Secondary | ICD-10-CM | POA: Diagnosis present

## 2018-03-28 DIAGNOSIS — E1042 Type 1 diabetes mellitus with diabetic polyneuropathy: Secondary | ICD-10-CM | POA: Diagnosis present

## 2018-03-28 DIAGNOSIS — G629 Polyneuropathy, unspecified: Secondary | ICD-10-CM | POA: Diagnosis not present

## 2018-03-28 DIAGNOSIS — E569 Vitamin deficiency, unspecified: Secondary | ICD-10-CM | POA: Diagnosis present

## 2018-03-28 DIAGNOSIS — E1022 Type 1 diabetes mellitus with diabetic chronic kidney disease: Secondary | ICD-10-CM | POA: Diagnosis present

## 2018-03-28 DIAGNOSIS — I509 Heart failure, unspecified: Secondary | ICD-10-CM | POA: Diagnosis present

## 2018-03-28 DIAGNOSIS — R569 Unspecified convulsions: Secondary | ICD-10-CM

## 2018-03-28 DIAGNOSIS — F202 Catatonic schizophrenia: Secondary | ICD-10-CM | POA: Diagnosis present

## 2018-03-28 DIAGNOSIS — N2581 Secondary hyperparathyroidism of renal origin: Secondary | ICD-10-CM | POA: Diagnosis present

## 2018-03-28 DIAGNOSIS — F332 Major depressive disorder, recurrent severe without psychotic features: Principal | ICD-10-CM | POA: Diagnosis present

## 2018-03-28 DIAGNOSIS — E875 Hyperkalemia: Secondary | ICD-10-CM | POA: Diagnosis not present

## 2018-03-28 DIAGNOSIS — H5462 Unqualified visual loss, left eye, normal vision right eye: Secondary | ICD-10-CM | POA: Diagnosis present

## 2018-03-28 DIAGNOSIS — E10319 Type 1 diabetes mellitus with unspecified diabetic retinopathy without macular edema: Secondary | ICD-10-CM | POA: Diagnosis present

## 2018-03-28 DIAGNOSIS — E1165 Type 2 diabetes mellitus with hyperglycemia: Secondary | ICD-10-CM | POA: Diagnosis not present

## 2018-03-28 DIAGNOSIS — E1069 Type 1 diabetes mellitus with other specified complication: Secondary | ICD-10-CM | POA: Diagnosis present

## 2018-03-28 DIAGNOSIS — I10 Essential (primary) hypertension: Secondary | ICD-10-CM | POA: Diagnosis present

## 2018-03-28 DIAGNOSIS — E1065 Type 1 diabetes mellitus with hyperglycemia: Secondary | ICD-10-CM | POA: Diagnosis present

## 2018-03-28 DIAGNOSIS — Z915 Personal history of self-harm: Secondary | ICD-10-CM | POA: Diagnosis not present

## 2018-03-28 DIAGNOSIS — E10649 Type 1 diabetes mellitus with hypoglycemia without coma: Secondary | ICD-10-CM | POA: Diagnosis present

## 2018-03-28 DIAGNOSIS — R45851 Suicidal ideations: Secondary | ICD-10-CM | POA: Diagnosis present

## 2018-03-28 DIAGNOSIS — N189 Chronic kidney disease, unspecified: Secondary | ICD-10-CM

## 2018-03-28 DIAGNOSIS — Z818 Family history of other mental and behavioral disorders: Secondary | ICD-10-CM

## 2018-03-28 DIAGNOSIS — D631 Anemia in chronic kidney disease: Secondary | ICD-10-CM | POA: Diagnosis present

## 2018-03-28 LAB — GLUCOSE, CAPILLARY
GLUCOSE-CAPILLARY: 149 mg/dL — AB (ref 70–99)
Glucose-Capillary: 157 mg/dL — ABNORMAL HIGH (ref 70–99)
Glucose-Capillary: 184 mg/dL — ABNORMAL HIGH (ref 70–99)
Glucose-Capillary: 103 mg/dL — ABNORMAL HIGH (ref 70–99)
Glucose-Capillary: 80 mg/dL (ref 70–99)
Glucose-Capillary: 42 mg/dL — CL (ref 70–99)

## 2018-03-28 LAB — BASIC METABOLIC PANEL
Anion gap: 10 (ref 5–15)
BUN: 48 mg/dL — AB (ref 6–20)
CO2: 26 mmol/L (ref 22–32)
Calcium: 7.9 mg/dL — ABNORMAL LOW (ref 8.9–10.3)
Chloride: 99 mmol/L (ref 98–111)
Creatinine, Ser: 11.09 mg/dL — ABNORMAL HIGH (ref 0.61–1.24)
GFR calc Af Amer: 6 mL/min — ABNORMAL LOW (ref >60)
GFR calc non Af Amer: 5 mL/min — ABNORMAL LOW (ref >60)
Glucose, Bld: 217 mg/dL — ABNORMAL HIGH (ref 70–99)
Potassium: 5 mmol/L (ref 3.5–5.1)
Sodium: 135 mmol/L (ref 135–145)

## 2018-03-28 MED ORDER — HEPARIN SODIUM (PORCINE) 5000 UNIT/ML IJ SOLN
5000.0000 [IU] | Freq: Three times a day (TID) | INTRAMUSCULAR | Status: DC
  Administered 2018-03-28 – 2018-03-30 (×4): 5000 [IU] via SUBCUTANEOUS
  Filled 2018-03-28 (×8): qty 1

## 2018-03-28 MED ORDER — INSULIN GLARGINE 100 UNIT/ML ~~LOC~~ SOLN
8.0000 [IU] | Freq: Every day | SUBCUTANEOUS | Status: DC
  Administered 2018-03-29 – 2018-03-30 (×2): 8 [IU] via SUBCUTANEOUS
  Filled 2018-03-28 (×3): qty 0.08

## 2018-03-28 MED ORDER — MAGNESIUM HYDROXIDE 400 MG/5ML PO SUSP: 30.0000 mL | Freq: Every day | ORAL | Status: DC | PRN

## 2018-03-28 MED ORDER — FLUOXETINE HCL 20 MG PO CAPS: 20.0000 mg | ORAL_CAPSULE | Freq: Every day | ORAL | 3 refills | Status: DC

## 2018-03-28 MED ORDER — LEVETIRACETAM 500 MG PO TABS
500.0000 mg | ORAL_TABLET | Freq: Every day | ORAL | Status: DC
  Administered 2018-03-29 – 2018-04-11 (×14): 500 mg via ORAL
  Filled 2018-03-28 (×15): qty 1

## 2018-03-28 MED ORDER — CINACALCET HCL 30 MG PO TABS
90.0000 mg | ORAL_TABLET | ORAL | Status: DC
  Administered 2018-03-29 – 2018-04-10 (×5): 90 mg via ORAL
  Filled 2018-03-28 (×6): qty 3

## 2018-03-28 MED ORDER — INSULIN ASPART 100 UNIT/ML ~~LOC~~ SOLN
0.0000 [IU] | Freq: Three times a day (TID) | SUBCUTANEOUS | Status: DC
  Administered 2018-03-29: 3 [IU] via SUBCUTANEOUS
  Administered 2018-03-29: 2 [IU] via SUBCUTANEOUS
  Filled 2018-03-28 (×2): qty 1

## 2018-03-28 MED ORDER — ONDANSETRON HCL 4 MG/2ML IJ SOLN
4.0000 mg | Freq: Four times a day (QID) | INTRAMUSCULAR | Status: DC | PRN
  Filled 2018-03-28: qty 2

## 2018-03-28 MED ORDER — NIFEDIPINE ER OSMOTIC RELEASE 30 MG PO TB24
30.0000 mg | ORAL_TABLET | Freq: Two times a day (BID) | ORAL | Status: DC
  Administered 2018-03-29 – 2018-04-11 (×25): 30 mg via ORAL
  Filled 2018-03-28 (×28): qty 1

## 2018-03-28 MED ORDER — NEPRO/CARBSTEADY PO LIQD
237.0000 mL | Freq: Every morning | ORAL | Status: DC
  Administered 2018-03-30 – 2018-04-01 (×2): 237 mL via ORAL

## 2018-03-28 MED ORDER — CALCIUM ACETATE (PHOS BINDER) 667 MG PO CAPS
2001.0000 mg | ORAL_CAPSULE | Freq: Three times a day (TID) | ORAL | Status: DC
  Administered 2018-03-29 – 2018-04-11 (×36): 2001 mg via ORAL
  Filled 2018-03-28 (×43): qty 3

## 2018-03-28 MED ORDER — PRO-STAT SUGAR FREE PO LIQD
30.0000 mL | Freq: Two times a day (BID) | ORAL | Status: DC
  Administered 2018-03-30 – 2018-04-01 (×3): 30 mL via ORAL

## 2018-03-28 MED ORDER — INSULIN GLARGINE 100 UNIT/ML ~~LOC~~ SOLN: 14.0000 [IU] | SUBCUTANEOUS | 11 refills | Status: DC

## 2018-03-28 MED ORDER — PRO-STAT SUGAR FREE PO LIQD
30.0000 mL | Freq: Two times a day (BID) | ORAL | Status: DC
  Administered 2018-03-28: 30 mL via ORAL
  Filled 2018-03-28: qty 30

## 2018-03-28 MED ORDER — ALUM & MAG HYDROXIDE-SIMETH 200-200-20 MG/5ML PO SUSP: 30.0000 mL | ORAL | Status: DC | PRN

## 2018-03-28 MED ORDER — FLUOXETINE HCL 20 MG PO CAPS
20.0000 mg | ORAL_CAPSULE | Freq: Every day | ORAL | Status: DC
  Administered 2018-03-29 – 2018-04-11 (×13): 20 mg via ORAL
  Filled 2018-03-28 (×14): qty 1

## 2018-03-28 MED ORDER — ENSURE MAX PROTEIN PO LIQD
11.0000 [oz_av] | Freq: Every day | ORAL | Status: DC
  Administered 2018-03-29 – 2018-03-31 (×3): 11 [oz_av] via ORAL
  Filled 2018-03-28: qty 330

## 2018-03-28 MED ORDER — INSULIN ASPART 100 UNIT/ML ~~LOC~~ SOLN
5.0000 [IU] | Freq: Three times a day (TID) | SUBCUTANEOUS | Status: DC
  Administered 2018-03-29 – 2018-03-30 (×3): 5 [IU] via SUBCUTANEOUS

## 2018-03-28 MED ORDER — LEVETIRACETAM 250 MG PO TABS
250.0000 mg | ORAL_TABLET | ORAL | Status: DC
  Administered 2018-03-29 – 2018-04-10 (×6): 250 mg via ORAL
  Filled 2018-03-28 (×6): qty 1

## 2018-03-28 MED ORDER — ORAL CARE MOUTH RINSE: 15.0000 mL | Freq: Two times a day (BID) | OROMUCOSAL | Status: DC

## 2018-03-28 MED ORDER — ACETAMINOPHEN 325 MG PO TABS: 650.0000 mg | ORAL_TABLET | Freq: Four times a day (QID) | ORAL | Status: DC | PRN

## 2018-03-28 MED ORDER — CHLORHEXIDINE GLUCONATE CLOTH 2 % EX PADS: 6.0000 | MEDICATED_PAD | Freq: Every day | CUTANEOUS | Status: DC

## 2018-03-28 MED ORDER — INSULIN GLARGINE 100 UNIT/ML ~~LOC~~ SOLN
14.0000 [IU] | Freq: Every day | SUBCUTANEOUS | Status: DC
  Administered 2018-03-29 – 2018-03-30 (×2): 14 [IU] via SUBCUTANEOUS
  Filled 2018-03-28 (×2): qty 0.14

## 2018-03-28 MED ORDER — ENSURE MAX PROTEIN PO LIQD
11.0000 [oz_av] | Freq: Every day | ORAL | Status: DC
  Administered 2018-03-28: 11 [oz_av] via ORAL
  Filled 2018-03-28: qty 330

## 2018-03-28 MED ORDER — ACETAMINOPHEN 325 MG PO TABS: 650.0000 mg | ORAL_TABLET | ORAL | Status: DC | PRN

## 2018-03-28 MED ORDER — RENA-VITE PO TABS
1.0000 | ORAL_TABLET | Freq: Every day | ORAL | Status: DC
  Administered 2018-03-29 – 2018-04-10 (×13): 1 via ORAL
  Filled 2018-03-28 (×14): qty 1

## 2018-03-28 MED ORDER — INSULIN GLARGINE 100 UNIT/ML ~~LOC~~ SOLN: 8.0000 [IU] | Freq: Every day | SUBCUTANEOUS | 11 refills | Status: DC

## 2018-03-28 MED ORDER — ISOSORB DINITRATE-HYDRALAZINE 20-37.5 MG PO TABS
1.0000 | ORAL_TABLET | Freq: Three times a day (TID) | ORAL | Status: DC
  Administered 2018-03-29 – 2018-04-11 (×35): 1 via ORAL
  Filled 2018-03-28 (×42): qty 1

## 2018-03-28 MED ORDER — CHLORHEXIDINE GLUCONATE CLOTH 2 % EX PADS
6.0000 | MEDICATED_PAD | Freq: Every day | CUTANEOUS | Status: DC
  Administered 2018-03-31 – 2018-04-11 (×7): 6 via TOPICAL

## 2018-03-28 MED ORDER — NEPRO/CARBSTEADY PO LIQD: 237.0000 mL | Freq: Every morning | ORAL | Status: DC

## 2018-03-28 NOTE — Progress Notes (Signed)
[facility]  PROGRESS NOTE  Edward Colon CLE:[id number] DOB: 1980-07-21 DOA: 03/14/2018 PCP: Nolene Ebbs, MD   Brief HPI:   38 year old male with a history of type diabetes mellitus, ESRD on hemodialysis was found unresponsive at home.  Records were down to 33.  He also had a witnessed generalized seizure.  Patient had a poor response to glucagon and dextrose and required invasive mechanical ventilation.Patient admitted to ICU with encephalopathy due to severe hypoglycemia complicated by generalized seizure.  Patient was extubated on January 10 and transferred to [facility] service on January 12.    Subjective   Patient seen and examined, denies any complaints.   Assessment/Plan:     1. Metabolic encephalopathy due to hypoglycemia-blood glucose is well controlled now.  Continue Lantus 14 units every morning and 8 units every afternoon, sliding scale insulin with NovoLog.  NovoLog 5 units 3 times daily meal coverage.  2. Generalized seizure-continue Keppra, no active seizures in the hospital.  3. End-stage renal disease-on hemodialysis Tuesday Thursday and Saturday.  Nephrology following  4. Hypertension-continue nifedipine, isosorbide, hydralazine  5. Depression-continue one-to-one sitter, pending placement to inpatient psych unit.  Continue Prozac 20 mg p.o. daily     CBG: Recent Labs  Lab 03/27/18 2014 03/28/18 0008 03/28/18 0734 03/28/18 1114 03/28/18 1705  GLUCAP 99 149* 157* 184* 103*    CBC: Recent Labs  Lab 03/22/18 0627 03/27/18 0740  WBC 2.3* 4.7  HGB 14.0 10.8*  HCT 42.7 32.3*  MCV 85.7 86.4  PLT 238 944    Basic Metabolic Panel: Recent Labs  Lab 03/22/18 0627 03/23/18 0346 03/24/18 0608 03/27/18 0434 03/28/18 0439  NA 136 136 133* 137 135  K 4.6 4.1 4.9 4.3 5.0  CL 97* 97* 94* 96* 99  CO2 24 26 23 24 26   GLUCOSE 210* 162* 258* 39* 217*  BUN 71* 48* 69* 91* 48*  CREATININE 12.50* 10.29* 13.18* 17.37* 11.09*  CALCIUM  9.1 8.5* 9.2 9.1 7.9*  PHOS 5.0*  --   --   --   --      DVT prophylaxis: Heparin  Code Status: Full code  Family Communication: No family at bedside  Disposition Plan: Inpatient psychiatric facility   Consultants:  Nephrology  Psychiatry  Procedures:     Antibiotics:   Anti-infectives (From admission, onward)   Start     Dose/Rate Route Frequency Ordered Stop   03/15/18 0500  ceFAZolin (ANCEF) IVPB 2g/100 mL premix     2 g 200 mL/hr over 30 Minutes Intravenous  Once 03/15/18 0428 03/15/18 0546   03/14/18 1145  ceFEPIme (MAXIPIME) 2 g in sodium chloride 0.9 % 100 mL IVPB  Status:  Discontinued     2 g 200 mL/hr over 30 Minutes Intravenous  Once 03/14/18 1131 03/14/18 1204   03/14/18 1145  vancomycin (VANCOCIN) 2,000 mg in sodium chloride 0.9 % 500 mL IVPB  Status:  Discontinued     2,000 mg 250 mL/hr over 120 Minutes Intravenous  Once 03/14/18 1131 03/14/18 1204       Objective   Vitals:   03/27/18 1700 03/27/18 2019 03/28/18 0948 03/28/18 1710  BP: (!) 153/82 132/76 (!) 155/84 (!) 150/83  Pulse: 88 96 87 83  Resp: 18 14 18 18   Temp: 99.1 F (37.3 C) 98.2 F (36.8 C) 98.5 F (36.9 C) (!) 97.5 F (36.4 C)  TempSrc: Oral Oral Oral Oral  SpO2: 97% 99% 99% 100%  Weight:      Height:  Intake/Output Summary (Last 24 hours) at 03/28/2018 1808 Last data filed at 03/28/2018 1700 Gross per 24 hour  Intake 840 ml  Output -  Net 840 ml   Filed Weights   03/26/18 1729 03/27/18 0750 03/27/18 1210  Weight: 91.5 kg 89.7 kg 87.3 kg     Physical Examination:    General:  Appears in no acute distress  Cardiovascular: S1-S2, regular, no murmur auscultated  Respiratory: Clear to auscultation bilaterally  Abdomen: Soft, nontender, no organomegaly  Extremities: No edema  Psychiatric-alert, oriented x3 flat affect.     Data Reviewed: I have personally reviewed following labs and imaging studies   No results found for this or any previous  visit (from the past 240 hour(s)).   Liver Function Tests: Recent Labs  Lab 03/22/18 0627  ALBUMIN 3.1*   No results for input(s): LIPASE, AMYLASE in the last 168 hours. No results for input(s): AMMONIA in the last 168 hours.  Cardiac Enzymes: No results for input(s): CKTOTAL, CKMB, CKMBINDEX, TROPONINI in the last 168 hours. BNP (last 3 results) No results for input(s): BNP in the last 8760 hours.  ProBNP (last 3 results) No results for input(s): PROBNP in the last 8760 hours.    Studies: No results found.  Scheduled Meds: . calcium acetate  2,001 mg Oral TID WC  . Chlorhexidine Gluconate Cloth  6 each Topical Q0600  . cinacalcet  90 mg Oral Q T,Th,Sa-HD  . [START ON 03/29/2018] feeding supplement (NEPRO CARB STEADY)  237 mL Oral q morning - 10a  . feeding supplement (PRO-STAT SUGAR FREE 64)  30 mL Oral BID PC  . FLUoxetine  20 mg Oral Daily  . heparin  5,000 Units Subcutaneous Q8H  . insulin aspart  0-9 Units Subcutaneous TID WC  . insulin aspart  5 Units Subcutaneous TID WC  . insulin glargine  14 Units Subcutaneous Daily  . insulin glargine  8 Units Subcutaneous QHS  . isosorbide-hydrALAZINE  1 tablet Oral TID  . levETIRAcetam  250 mg Oral Q T,Th,Sat-1800  . levETIRAcetam  500 mg Oral Q0600  . mouth rinse  15 mL Mouth Rinse BID  . multivitamin  1 tablet Oral QHS  . NIFEdipine  30 mg Oral BID  . ENSURE MAX PROTEIN  11 oz Oral Daily    Admission status: Inpatient: Based on patients clinical presentation and evaluation of above clinical data, I have made determination that patient meets Inpatient criteria at this time.  Time spent: 20 min  [facility] Pager [phone]. If 7PM-7AM, please contact night-coverage at [email or web address], Office  [phone]  password [email or web address]  03/28/2018, 6:08 PM  LOS: 14 days

## 2018-03-28 NOTE — Progress Notes (Signed)
Paged NP to reconcile medications.

## 2018-03-28 NOTE — Consult Note (Signed)
   [facility] Inpatient Consult   03/28/2018  Edward Colon 06/16/1980 [id number]   [facility] Follow-up:    Per chart review, disposition plan is for transition to inpatient psych.  No [facility] needs at this time.  [name], MSN, [facility] Liaison Nurse Mobile Phone [phone]  Toll free office [phone]

## 2018-03-28 NOTE — BH Assessment (Addendum)
TTS spoke with CSW - Lorriane Shire [phone]) to inform her that pt has been accepted to [facility] for inpatient psychiatric treatment and admission orders have been submitted.  Patient has been accepted to [facility].  Accepting physician is Dr. Leverne Humbles.  Attending Physician will be Dr. Weber Cooks.  Patient has been assigned to room 304, by [place].  Call report to [phone].  Representative/Transfer Coordinator is Lorriane Shire (CSW). Patient pre-admitted by [facility] Patient Access Elberta Fortis)   [facility] Staff Lorriane Shire, [place]) made aware of acceptance.   TTS informed CSW that transportation would need to be put on hold until [facility] can secure staffing to accompany pt to dialysis while admitted for psych inpatient.  TTS then informed CSW that a call would be made to the [facility] secretary [phone]) to confirm when pt can be transported to [facility].

## 2018-03-28 NOTE — Progress Notes (Addendum)
[facility] Progress Note   Dialysis Orders: TTS SW 4h 2min 500/800 88.5kg 2/2.25 LUA AVF Hep 5000 Sensipar 90 venofer 50/wk Mircera 50 every 2 wks, last 12/28  Assessment/Plan: 1.T1DM, uncontrolled w/Hypoglycemia - found down at home, now improved. Per primary- some issues, still labile but not new for pt 2. Acute encephalopathy/seizure - 2/2 hypoglycemia & non compliance with meds, improvingbut affect flat, non engaging -  3. Major depressive disorder - 2 episodes of agitation in hospital - had not happened before at his home HD unit; seen by psych and started on prozac - sitter for suicide precautions- medically stable to transfer to inpatient psych- awaiting bed 4. Acute respiratory failure - Improved.  5. ESRD -on HD TTS  K up to 5 today - surprising given HD yesterday - Cr came down 17 to 11- continue to follow HD orders written for tomorrow- push for BFR of 500  6. Anemia of CKD-Hgb 11.3-> 10.8 No indication for ESA at this time. Continue to trend 7. Secondary hyperparathyroidism-, continue sensipar, phoslo. Not on VDRA.P now in goal. 8. HTN/volume -BP still a little high net UF 2.5 Tuesday - BP still up some post wt 87.3 - below edw - gently challenge volume - 9. Nutrition -Renal/Carb modifed diet w/fluid restrictions, renavite. Protein supplements. Intake marginal 10. Dispo - awaiting tx to inpatient psych ward  Myriam Jacobson, PA-C [place] 03/28/2018,10:23 AM  LOS: 14 days    Pt seen, examined and agree w A/P as above.  Kelly Splinter MD [employer] pager [phone]   03/28/2018, 12:15 PM     Subjective:   Sitter in room. Not engaged at all today.   Objective Vitals:   03/27/18 1251 03/27/18 1700 03/27/18 2019 03/28/18 0948  BP: (!) 159/80 (!) 153/82 132/76 (!) 155/84  Pulse: 93 88 96 87  Resp: 20 18 14 18   Temp: 98.4 F (36.9 C) 99.1 F (37.3 C) 98.2 F (36.8 C) 98.5 F  (36.9 C)  TempSrc: Oral Oral Oral Oral  SpO2: 100% 97% 99% 99%  Weight:      Height:       Physical Exam goal 3 L  General: lying in bed - poor engagement - mumbles - no eye contact Heart: RRR Lungs: grossly clear Abdomen: soft NT Extremities: no LE edema Dialysis Access:  Left AVF + bruit Additional Objective Labs: Basic Metabolic Panel: Recent Labs  Lab 03/22/18 0627  03/24/18 0608 03/27/18 0434 03/28/18 0439  NA 136   < > 133* 137 135  K 4.6   < > 4.9 4.3 5.0  CL 97*   < > 94* 96* 99  CO2 24   < > 23 24 26   GLUCOSE 210*   < > 258* 39* 217*  BUN 71*   < > 69* 91* 48*  CREATININE 12.50*   < > 13.18* 17.37* 11.09*  CALCIUM 9.1   < > 9.2 9.1 7.9*  PHOS 5.0*  --   --   --   --    < > = values in this interval not displayed.   Liver Function Tests: Recent Labs  Lab 03/22/18 0627  ALBUMIN 3.1*   CBC: Recent Labs  Lab 03/22/18 0627 03/27/18 0740  WBC 2.3* 4.7  HGB 14.0 10.8*  HCT 42.7 32.3*  MCV 85.7 86.4  PLT 238 309   Blood Culture    Component Value Date/Time   SDES TRACHEAL ASPIRATE 03/15/2018 0738   SPECREQUEST NONE  03/15/2018 0738   CULT MODERATE Consistent with normal respiratory flora. 03/15/2018 0738   REPTSTATUS 03/17/2018 FINAL 03/15/2018 0738    Cardiac Enzymes: No results for input(s): CKTOTAL, CKMB, CKMBINDEX, TROPONINI in the last 168 hours. CBG: Recent Labs  Lab 03/27/18 1702 03/27/18 1836 03/27/18 2014 03/28/18 0008 03/28/18 0734  GLUCAP 433* 360* 99 149* 157*   Iron Studies: No results for input(s): IRON, TIBC, TRANSFERRIN, FERRITIN in the last 72 hours. Lab Results  Component Value Date   INR 1.00 03/14/2018   INR 1.13 03/06/2017   INR 1.15 03/05/2017   Studies/Results: No results found. Medications:  . calcium acetate  2,001 mg Oral TID WC  . Chlorhexidine Gluconate Cloth  6 each Topical Q0600  . cinacalcet  90 mg Oral Q T,Th,Sa-HD  . feeding supplement (NEPRO CARB STEADY)  237 mL Oral BID BM  . FLUoxetine  20 mg  Oral Daily  . heparin  5,000 Units Subcutaneous Q8H  . insulin aspart  0-9 Units Subcutaneous TID WC  . insulin aspart  5 Units Subcutaneous TID WC  . insulin glargine  14 Units Subcutaneous Daily  . insulin glargine  8 Units Subcutaneous QHS  . isosorbide-hydrALAZINE  1 tablet Oral TID  . levETIRAcetam  250 mg Oral Q T,Th,Sat-1800  . levETIRAcetam  500 mg Oral Q0600  . mouth rinse  15 mL Mouth Rinse BID  . multivitamin  1 tablet Oral QHS  . NIFEdipine  30 mg Oral BID

## 2018-03-28 NOTE — Progress Notes (Signed)
Nutrition Follow-up  DOCUMENTATION CODES:   Not applicable  INTERVENTION:   Change Nepro Shake po daily, each supplement provides 425 kcal and 19 grams protein   Add Ensure Max daily, each supplement provides 150 kcals, 30 g of protein, 470 mg of potassium and 280 mg phosphorus   Add 30 ml Prostat BID, each supplement provides 100 kcals and 15 grams protein.   Continue Rena-Vit  NUTRITION DIAGNOSIS:   Inadequate oral intake related to acute illness as evidenced by NPO status.  Being addressed via supplements  GOAL:   Patient will meet greater than or equal to 90% of their needs  Progressing  MONITOR:   PO intake, Supplement acceptance, Labs, Weight trends  REASON FOR ASSESSMENT:   Ventilator    ASSESSMENT:   38 y.o male with PMH: Type 1 diabetes mellitus, ESRD on HD, diabetic peripheral neuropathy and history of hypoglycemic seizures. Admitted with hypoglycemic seizure and acute respiratory failure requiring intubatation and sedation.   Pt awaiting bed for transfer to Inpatient Psych; pt with MDD, now with 1:1 sitter.  Pt sleeping on visit but arouseable. Flat affect, does not open eyes.   Lunch tray untouched at bedside; pt does not appear to want to eat lunch. Pt took sips of Nepro, does not really like. Pt reports she drinks supplements like Muscle Milk as outpatient. When RD asked pt if he had preference in flavor, pt just shrugged his shoulders.  Patient does report that he ate a good breakfast however. Recorded po intake 100% at breakfast. Recent recorded po intake has been 100%.   Current wt 87.3 kg; last HD yesterday with net UF 2.5 L. EDW 88.5 kg. Currently under dry wt  CBGs remain labile, diabetes coordinator following closely  Labs: CBGs 99-433, phosphorus 5.0 on 1/16 (acceptable for HD) Meds: prozac, ss novolog, 5 units novolog with meals, 14 units lantus at 1430 and 8 units of lantus at 2200, Rena-Vit, Sensipar, Phos-Lo  Diet Order:   Diet Order            Diet - low sodium heart healthy        Diet renal/carb modified with fluid restriction Diet-HS Snack? Nothing; Fluid restriction: 1200 mL Fluid; Room service appropriate? Yes; Fluid consistency: Thin  Diet effective now              EDUCATION NEEDS:   Not appropriate for education at this time  Skin:  Skin Assessment: Reviewed RN Assessment  Last BM:  1/9- Type 6  Height:   Ht Readings from Last 1 Encounters:  03/15/18 6' (1.829 m)    Weight:   Wt Readings from Last 1 Encounters:  03/27/18 87.3 kg    Ideal Body Weight:  72.7 kg  BMI:  Body mass index is 26.1 kg/m.  Estimated Nutritional Needs:   Kcal:  2300-2500 kcals   Protein:  115-130 g   Fluid:  1,000 mL +UOP   Kerman Passey MS, RD, LDN, CNSC [phone] Pager  [phone] Weekend/On-Call Pager

## 2018-03-28 NOTE — BH Assessment (Addendum)
[facility] TTS attempted to call pt's CSW - Lorriane Shire [phone]) to inquire about pt's ADLs. No answer. Left HIPAA compliant voicemail - provided callback number.   Addendum: TTS was able to contact pt's CSW to inquire about ADLs and other medical assistance orders. She reports she will consult with pt's doctor to inquire about pt's medical needs.

## 2018-03-28 NOTE — Clinical Social Work Note (Addendum)
Patient will discharge this evening to [facility], transported by [employer] transportation.He will be picked up at 8 pm. Mr. Chason is going voluntary and the voluntary form has been signed, faxed to [facility] and placed in packet that will go with patient. When asked, Mr. Valli declined for CSW to call any family regarding his discharge. The intake person at [facility] will put in a progress note regarding patient's room number and phone number for nurse to call report. CSW signing off as no other SW intervention services needed.  5:45 pm - [facility] intake staff person Thornell Mule called and informed CSW that they need to cancel his admission for this evening as they must assure they have staff in place to accompany him to dialysis on Thursday. CSW advised that if anything changes this evening she will call [facility] unit secretary and advise. Kerry Dory will be intake staff per tomorrow. Provided phone number for CSW to call [phone].  [facility] transportation contacted [phone]) and transport cancelled. CSW will f/u with Kerry Dory at [facility] on Thursday.   Rudell Ortman Givens, MSW, LCSW Licensed Clinical Social Worker [place] [phone]

## 2018-03-28 NOTE — Progress Notes (Signed)
Received report from  [facility] that they are ready to accept the patient. [facility] transport called and will be in to transport patient.

## 2018-03-29 ENCOUNTER — Other Ambulatory Visit: Payer: Self-pay

## 2018-03-29 DIAGNOSIS — F332 Major depressive disorder, recurrent severe without psychotic features: Principal | ICD-10-CM

## 2018-03-29 LAB — RENAL FUNCTION PANEL
Albumin: 3.5 g/dL (ref 3.5–5.0)
Anion gap: 13 (ref 5–15)
BUN: 71 mg/dL — ABNORMAL HIGH (ref 6–20)
CALCIUM: 8.3 mg/dL — AB (ref 8.9–10.3)
CO2: 25 mmol/L (ref 22–32)
Chloride: 95 mmol/L — ABNORMAL LOW (ref 98–111)
Creatinine, Ser: 13.65 mg/dL — ABNORMAL HIGH (ref 0.61–1.24)
GFR calc Af Amer: 5 mL/min — ABNORMAL LOW (ref >60)
GFR calc non Af Amer: 4 mL/min — ABNORMAL LOW (ref >60)
Glucose, Bld: 247 mg/dL — ABNORMAL HIGH (ref 70–99)
Phosphorus: 4.5 mg/dL (ref 2.5–4.6)
Potassium: 5.2 mmol/L — ABNORMAL HIGH (ref 3.5–5.1)
SODIUM: 133 mmol/L — AB (ref 135–145)

## 2018-03-29 LAB — CBC
HCT: 32.3 % — ABNORMAL LOW (ref 39.0–52.0)
Hemoglobin: 10.6 g/dL — ABNORMAL LOW (ref 13.0–17.0)
MCH: 28.3 pg (ref 26.0–34.0)
MCHC: 32.8 g/dL (ref 30.0–36.0)
MCV: 86.4 fL (ref 80.0–100.0)
Platelets: 329 10*3/uL (ref 150–400)
RBC: 3.74 MIL/uL — AB (ref 4.22–5.81)
RDW: 12.9 % (ref 11.5–15.5)
WBC: 4.2 10*3/uL (ref 4.0–10.5)
nRBC: 0 % (ref 0.0–0.2)

## 2018-03-29 LAB — GLUCOSE, CAPILLARY
GLUCOSE-CAPILLARY: 177 mg/dL — AB (ref 70–99)
Glucose-Capillary: 56 mg/dL — ABNORMAL LOW (ref 70–99)
Glucose-Capillary: 67 mg/dL — ABNORMAL LOW (ref 70–99)
Glucose-Capillary: 185 mg/dL — ABNORMAL HIGH (ref 70–99)
Glucose-Capillary: 247 mg/dL — ABNORMAL HIGH (ref 70–99)
Glucose-Capillary: 54 mg/dL — ABNORMAL LOW (ref 70–99)
Glucose-Capillary: 146 mg/dL — ABNORMAL HIGH (ref 70–99)

## 2018-03-29 MED ORDER — MINOXIDIL 2.5 MG PO TABS
2.5000 mg | ORAL_TABLET | Freq: Every day | ORAL | Status: DC
  Administered 2018-03-29 – 2018-04-04 (×7): 2.5 mg via ORAL
  Filled 2018-03-29 (×8): qty 1

## 2018-03-29 NOTE — [facility] Group Notes (Signed)
LCSW Group Therapy Note  03/29/2018 1:00 PM  Type of Therapy/Topic:  Group Therapy:  Balance in Life  Participation Level:  Did Not Attend  Description of Group:    This group will address the concept of balance and how it feels and looks when one is unbalanced. Patients will be encouraged to process areas in their lives that are out of balance and identify reasons for remaining unbalanced. Facilitators will guide patients in utilizing problem-solving interventions to address and correct the stressor making their life unbalanced. Understanding and applying boundaries will be explored and addressed for obtaining and maintaining a balanced life. Patients will be encouraged to explore ways to assertively make their unbalanced needs known to significant others in their lives, using other group members and facilitator for support and feedback.  Therapeutic Goals: 1. Patient will identify two or more emotions or situations they have that consume much of in their lives. 2. Patient will identify signs/triggers that life has become out of balance:  3. Patient will identify two ways to set boundaries in order to achieve balance in their lives:  4. Patient will demonstrate ability to communicate their needs through discussion and/or role plays  Summary of Patient Progress:      Therapeutic Modalities:   Cognitive Behavioral Therapy Solution-Focused Therapy Assertiveness Training  Assunta Curtis MSW, LCSW 03/29/2018 2:19 PM

## 2018-03-29 NOTE — Progress Notes (Signed)
No report called on patient according to night shift. Patient just showed up around 9pm.

## 2018-03-29 NOTE — Progress Notes (Signed)
HD initiated via L AVF using 16g needles x2 without issue. No heparin treatment. Patient currently without complaints. Wt 90kg standing

## 2018-03-29 NOTE — Progress Notes (Signed)
D - Patient arrived on the unit at 2130 and was transferred here from [facility] in [place], [place]. Patient presented with a flat and sad affect. Patient was pleasant but guarded during assessment. Skin check completed with Cristie Hem, RN no abnormalities found. No contraband found during assessment of patient and belongings. Patient was guarded during assessment and would only answer a few questions. Patient shrugged when asked if he had thoughts of hurting himself, but did contract for safety with this Probation officer. Patient denied HI/AVH, pain and anxiety. Patient stated he has depression but didn't have a number to assign to it. Patient was quite and wanted to go to sleep. Patient oriented to the unit and his room, given snack and a drink. Patient is on dialysis.   A - Patient compliant with medication administration per MD orders and procedures on the unit. Patient given education. Patient given support and encouragement to be active in his treatment plan. Patient informed to let staff know if there are any issues or problems on the unit.   R - Patient being monitored Q 15 minutes for safety per unit protocol. Patient remains safe at this time.

## 2018-03-29 NOTE — Consult Note (Signed)
[facility]  CONSULT NOTE    Date: 03/29/2018                  Patient Name:  Edward Colon  MRN: [id number]  DOB: June 13, 1980  Age / Sex: 38 y.o., male         PCP: Nolene Ebbs, MD                 Service Requesting Consult: Dr. Weber Cooks                 Reason for Consult: End Stage Renal Disease            History of Present Illness: Mr. Edward Colon is a 38 y.o. black male with end stage renal disease on hemodialysis, depression, diabetes mellitus type I insulin dependent, and hypertension , who was transferred to [facility] behavioral health on 03/28/2018   Admitted with seizures and altered mental status on 03/14/18. Found toh ave hypoglycemia. Intubated and self extubated.   Now transferred for psychiatric care.   Seen and examined on hemodialysis treatment. Tolerating treatment well. UF goal of 4 liters.     HEMODIALYSIS FLOWSHEET:  Blood Flow Rate (mL/min): 450 mL/min Arterial Pressure (mmHg): -160 mmHg Venous Pressure (mmHg): 270 mmHg Transmembrane Pressure (mmHg): 60 mmHg Ultrafiltration Rate (mL/min): 1100 mL/min Dialysate Flow Rate (mL/min): 800 ml/min Conductivity: Machine : 13.9 Conductivity: Machine : 13.9 Dialysis Fluid Bolus: Normal Saline Bolus Amount (mL): 250 mL Dialysate Change: 2K     Medications: Outpatient medications: Medications Prior to Admission  Medication Sig Dispense Refill Last Dose  . calcium acetate (PHOSLO) 667 MG capsule Take 1 capsule (667 mg total) by mouth 3 (three) times daily with meals. (Patient taking differently: Take [phone] mg by mouth See admin instructions. Take 4 capsules (2668 mg) by mouth with meals and 2 capsules (1334 mg) with snacks) 90 capsule 0 03/11/2018 at Unknown time  . FLUoxetine (PROZAC) 20 MG capsule Take 1 capsule (20 mg total) by mouth daily.  3   . insulin aspart (NOVOLOG) 100 UNIT/ML injection Inject 0-9 Units into the skin 3 (three) times daily with meals for 30 days. For  glucose 121 to 150 use one unit, for 151 to 200 use two units, for 201 to 250 use three units, for 251 to 300 use five units, for 301 to 350 use seven units, for 351 or greater use 9 units. 8.1 mL 0   . insulin glargine (LANTUS) 100 UNIT/ML injection Inject 0.14 mLs (14 Units total) into the skin every morning. 10 mL 11   . insulin glargine (LANTUS) 100 UNIT/ML injection Inject 0.08 mLs (8 Units total) into the skin at bedtime. 10 mL 11   . isosorbide-hydrALAZINE (BIDIL) 20-37.5 MG tablet Take 1 tablet by mouth 3 (three) times daily. 90 tablet 0   . levETIRAcetam (KEPPRA) 250 MG tablet Take 1 tablet (250 mg total) by mouth every Tuesday, Thursday, and Saturday at 6 PM for 30 days. 12 tablet 0   . levETIRAcetam (KEPPRA) 500 MG tablet Take 1 tablet (500 mg total) by mouth daily at 6 (six) AM for 30 days. 30 tablet 0   . minoxidil (LONITEN) 2.5 MG tablet Take 2.5 mg by mouth 2 (two) times daily.     . multivitamin (RENA-VIT) TABS tablet Take 1 tablet by mouth at bedtime. (Patient taking differently: Take 1 tablet by mouth daily. ) 30 tablet 0 02/08/2018 at Unknown time  . NIFEdipine (ADALAT CC) 30 MG  24 hr tablet Take 1 tablet (30 mg total) by mouth 2 (two) times daily. 60 tablet 0     Current medications: Current Facility-Administered Medications  Medication Dose Route Frequency Provider Last Rate Last Dose  . acetaminophen (TYLENOL) tablet 650 mg  650 mg Oral Q4H PRN [name], MD      . alum & mag hydroxide-simeth (MAALOX/MYLANTA) 200-200-20 MG/5ML suspension 30 mL  30 mL Oral Q4H PRN [name], MD      . calcium acetate (PHOSLO) capsule 2,001 mg  2,001 mg Oral TID WC [name], MD   2,001 mg at 03/29/18 0850  . Chlorhexidine Gluconate Cloth 2 % PADS 6 each  6 each Topical Q0600 [name], MD      . cinacalcet [facility]) tablet 90 mg  90 mg Oral Q T,Th,Sa-HD [name], MD      . feeding supplement (NEPRO CARB STEADY) liquid 237 mL  237 mL Oral q morning - 10a  [name], MD      . feeding supplement (PRO-STAT SUGAR FREE 64) liquid 30 mL  30 mL Oral BID PC [name], MD      . FLUoxetine (PROZAC) capsule 20 mg  20 mg Oral Daily [name], MD      . heparin injection 5,000 Units  5,000 Units Subcutaneous Q8H [name], MD   5,000 Units at 03/29/18 0630  . insulin aspart (novoLOG) injection 0-9 Units  0-9 Units Subcutaneous TID WC [name], MD      . insulin aspart (novoLOG) injection 5 Units  5 Units Subcutaneous TID WC [name], MD      . insulin glargine (LANTUS) injection 14 Units  14 Units Subcutaneous Daily [name], MD   14 Units at 03/29/18 [phone]  . insulin glargine (LANTUS) injection 8 Units  8 Units Subcutaneous QHS [name], MD      . isosorbide-hydrALAZINE (BIDIL) 20-37.5 MG per tablet 1 tablet  1 tablet Oral TID [name], MD   Stopped at 03/29/18 [phone]  . levETIRAcetam (KEPPRA) tablet 250 mg  250 mg Oral Q T,Th,Sat-1800 [name], MD      . levETIRAcetam [facility]) tablet 500 mg  500 mg Oral Q0600 [name], MD   500 mg at 03/29/18 2694  . magnesium hydroxide (MILK OF MAGNESIA) suspension 30 mL  30 mL Oral Daily PRN [name], MD      . MEDLINE mouth rinse  15 mL Mouth Rinse BID [name], MD   Stopped at 03/29/18 [phone]  . multivitamin (RENA-VIT) tablet 1 tablet  1 tablet Oral QHS [name], MD      . NIFEdipine (PROCARDIA-XL/NIFEDICAL-XL) 24 hr tablet 30 mg  30 mg Oral BID [name], MD   Stopped at 03/29/18 [phone]  . ondansetron (ZOFRAN) injection 4 mg  4 mg Intravenous Q6H PRN [name], MD      . protein supplement (ENSURE MAX) liquid  11 oz Oral Daily [name], MD          Allergies: No Known Allergies    Past Medical History: Past Medical History:  Diagnosis Date  . Anemia   . Blind left eye since ~ 2010  . Depression   . Diabetic peripheral neuropathy ([place])   . ESRD (end stage renal disease) on dialysis [facility])     "TTS; [place]; [facility]" (02/01/2016)  .  Heart murmur    denies any problems with it  . Hypertension   . Seizures ([place])    "last one was end of 2016; they are related to my diabetes" (02/01/2016)  . Type 1 diabetes ([place]) dx'd 1990     Past Surgical History: Past Surgical History:  Procedure Laterality Date  . AV FISTULA PLACEMENT Right 03/03/2015   Procedure: RIGHT RADIO-CEPHALIC ARTERIOVENOUS (AV) FISTULA CREATION;  Surgeon: Serafina Mitchell, MD;  Location: [facility] OR;  Service: Vascular;  Laterality: Right;  . AV FISTULA PLACEMENT Left 06/15/2015   Procedure: INSERTION OF LEFT UPPER ARM  ARTERIOVENOUS (AV) 38mm x 50cm GORE-TEX GRAFT;  Surgeon: Elam Dutch, MD;  Location: [place];  Service: Vascular;  Laterality: Left;  . BASCILIC VEIN TRANSPOSITION Left 04/01/2015   Procedure: BASCILIC VEIN TRANSPOSITION-LEFT ARM- FIRST STAGE;  Surgeon: Elam Dutch, MD;  Location: [place];  Service: Vascular;  Laterality: Left;  . EYE SURGERY Right ~ 2010   for diabetic retinopathy  . INSERTION OF DIALYSIS CATHETER Right 03/03/2015   Procedure: INSERTION OF DIALYSIS CATHETER;  Surgeon: Serafina Mitchell, MD;  Location: [facility] OR;  Service: Vascular;  Laterality: Right;     Family History: Family History  Problem Relation Age of Onset  . Diabetes Neg Hx      Social History: Social History   Socioeconomic History  . Marital status: Single    Spouse name: Not on file  . Number of children: Not on file  . Years of education: Not on file  . Highest education level: Not on file  Occupational History  . Not on file  Social Needs  . Financial resource strain: Not on file  . Food insecurity:    Worry: Not on file    Inability: Not on file  . Transportation needs:    Medical: Not on file    Non-medical: Not on file  Tobacco Use  . Smoking status: Never Smoker  . Smokeless tobacco: Never Used  Substance and Sexual Activity  . Alcohol use: No    Frequency: Never    Comment: 02/01/2016  "might have 1 drink/year"  . Drug use: No  . Sexual activity: Yes  Lifestyle  . Physical activity:    Days per week: Not on file    Minutes per session: Not on file  . Stress: Not on file  Relationships  . Social connections:    Talks on phone: Not on file    Gets together: Not on file    Attends religious service: Not on file    Active member of club or organization: Not on file    Attends meetings of clubs or organizations: Not on file    Relationship status: Not on file  . Intimate partner violence:    Fear of current or ex partner: Not on file    Emotionally abused: Not on file    Physically abused: Not on file    Forced sexual activity: Not on file  Other Topics Concern  . Not on file  Social History Narrative  . Not on file     Review of Systems: Review of Systems  Unable to perform ROS: Psychiatric disorder    Vital Signs: Blood pressure (!) 175/94, pulse 76, temperature 98 F (36.7 C), temperature source Oral, resp. rate 12, height 6' (1.829 m), weight 90 kg, SpO2 99 %.  Weight trends: Filed Weights   03/28/18 2149 03/29/18 0500 03/29/18 0900  Weight: 87.3 kg 87.3 kg 90 kg  Physical Exam: General: NAD, sitting in chair   Head: Normocephalic, atraumatic. Moist oral mucosal membranes  Eyes: Anicteric, PERRL  Neck: Supple, trachea midline  Lungs:  Clear to auscultation  Heart: Regular rate and rhythm  Abdomen:  Soft, nontender,   Extremities:  no peripheral edema.  Neurologic: Nonfocal, moving all four extremities  Skin: No lesions  Access: Left AVF     Lab results: Basic Metabolic Panel: Recent Labs  Lab 03/27/18 0434 03/28/18 0439 03/29/18 0900  NA 137 135 133*  K 4.3 5.0 5.2*  CL 96* 99 95*  CO2 24 26 25   GLUCOSE 39* 217* 247*  BUN 91* 48* 71*  CREATININE 17.37* 11.09* 13.65*  CALCIUM 9.1 7.9* 8.3*  PHOS  --   --  4.5    Liver Function Tests: Recent Labs  Lab 03/29/18 0900  ALBUMIN 3.5   No results for input(s): LIPASE,  AMYLASE in the last 168 hours. No results for input(s): AMMONIA in the last 168 hours.  CBC: Recent Labs  Lab 03/27/18 0740 03/29/18 0900  WBC 4.7 4.2  HGB 10.8* 10.6*  HCT 32.3* 32.3*  MCV 86.4 86.4  PLT 309 329    Cardiac Enzymes: No results for input(s): CKTOTAL, CKMB, CKMBINDEX, TROPONINI in the last 168 hours.  BNP: Invalid input(s): POCBNP  CBG: Recent Labs  Lab 03/28/18 2003 03/28/18 2153 03/29/18 0718 03/29/18 0736 03/29/18 0850  GLUCAP 80 42* 56* 18* 185*    Microbiology: Results for orders placed or performed during the hospital encounter of 03/14/18  Blood culture (routine x 2)     Status: Abnormal   Collection Time: 03/14/18  8:27 AM  Result Value Ref Range Status   Specimen Description BLOOD SITE NOT SPECIFIED  Final   Special Requests   Final    BOTTLES DRAWN AEROBIC AND ANAEROBIC Blood Culture adequate volume   Culture  Setup Time   Final    GRAM POSITIVE COCCI IN BOTH AEROBIC AND ANAEROBIC BOTTLES CRITICAL RESULT CALLED TO, READ BACK BY AND VERIFIED WITH: Denton Brick PHARMD [id number] 03/15/18 A BROWNING    Culture (A)  Final    STAPHYLOCOCCUS SPECIES (COAGULASE NEGATIVE) THE SIGNIFICANCE OF ISOLATING THIS ORGANISM FROM A SINGLE SET OF BLOOD CULTURES WHEN MULTIPLE SETS ARE DRAWN IS UNCERTAIN. PLEASE NOTIFY THE MICROBIOLOGY DEPARTMENT WITHIN ONE WEEK IF SPECIATION AND SENSITIVITIES ARE REQUIRED. Performed at [facility], [place] [address], [place], [place] [address]    Report Status 03/17/2018 FINAL  Final  Blood Culture ID Panel (Reflexed)     Status: Abnormal   Collection Time: 03/14/18  8:27 AM  Result Value Ref Range Status   Enterococcus species NOT DETECTED NOT DETECTED Final   Listeria monocytogenes NOT DETECTED NOT DETECTED Final   Staphylococcus species DETECTED (A) NOT DETECTED Final    Comment: Methicillin (oxacillin) susceptible coagulase negative staphylococcus. Possible blood culture contaminant (unless isolated from more than one blood  culture draw or clinical case suggests pathogenicity). No antibiotic treatment is indicated for blood  culture contaminants. CRITICAL RESULT CALLED TO, READ BACK BY AND VERIFIED WITH: Denton Brick PHARMD [id number] 03/15/18 A BROWNING    Staphylococcus aureus (BCID) NOT DETECTED NOT DETECTED Final   Methicillin resistance NOT DETECTED NOT DETECTED Final   Streptococcus species NOT DETECTED NOT DETECTED Final   Streptococcus agalactiae NOT DETECTED NOT DETECTED Final   Streptococcus pneumoniae NOT DETECTED NOT DETECTED Final   Streptococcus pyogenes NOT DETECTED NOT DETECTED Final   Acinetobacter baumannii NOT DETECTED NOT DETECTED Final  Enterobacteriaceae species NOT DETECTED NOT DETECTED Final   Enterobacter cloacae complex NOT DETECTED NOT DETECTED Final   Escherichia coli NOT DETECTED NOT DETECTED Final   Klebsiella oxytoca NOT DETECTED NOT DETECTED Final   Klebsiella pneumoniae NOT DETECTED NOT DETECTED Final   Proteus species NOT DETECTED NOT DETECTED Final   Serratia marcescens NOT DETECTED NOT DETECTED Final   Haemophilus influenzae NOT DETECTED NOT DETECTED Final   Neisseria meningitidis NOT DETECTED NOT DETECTED Final   Pseudomonas aeruginosa NOT DETECTED NOT DETECTED Final   Candida albicans NOT DETECTED NOT DETECTED Final   Candida glabrata NOT DETECTED NOT DETECTED Final   Candida krusei NOT DETECTED NOT DETECTED Final   Candida parapsilosis NOT DETECTED NOT DETECTED Final   Candida tropicalis NOT DETECTED NOT DETECTED Final    Comment: Performed at [facility], [place] [address] [place], [place] [address]  MRSA PCR Screening     Status: None   Collection Time: 03/14/18  2:39 PM  Result Value Ref Range Status   MRSA by PCR NEGATIVE NEGATIVE Final    Comment:        The GeneXpert MRSA Assay (FDA approved for NASAL specimens only), is one component of a comprehensive MRSA colonization surveillance program. It is not intended to diagnose MRSA infection nor to guide  or monitor treatment for MRSA infections. Performed at [facility], [place] [address] [place], [place] [address]   Blood culture (routine x 2)     Status: None   Collection Time: 03/14/18  2:55 PM  Result Value Ref Range Status   Specimen Description BLOOD RIGHT ANTECUBITAL  Final   Special Requests AEROBIC BOTTLE ONLY Blood Culture adequate volume  Final   Culture   Final    NO GROWTH 5 DAYS Performed at [facility], [place] [address], [place], [place] [address]    Report Status 03/19/2018 FINAL  Final  Culture, respiratory (non-expectorated)     Status: None   Collection Time: 03/15/18  7:38 AM  Result Value Ref Range Status   Specimen Description TRACHEAL ASPIRATE  Final   Special Requests NONE  Final   Gram Stain   Final    ABUNDANT WBC PRESENT, PREDOMINANTLY PMN ABUNDANT GRAM POSITIVE COCCI Performed at [facility], [place] [address], [place], [place] [address]    Culture MODERATE Consistent with normal respiratory flora.  Final   Report Status 03/17/2018 FINAL  Final    Coagulation Studies: No results for input(s): LABPROT, INR in the last 72 hours.  Urinalysis: No results for input(s): COLORURINE, LABSPEC, PHURINE, GLUCOSEU, HGBUR, BILIRUBINUR, KETONESUR, PROTEINUR, UROBILINOGEN, NITRITE, LEUKOCYTESUR in the last 72 hours.  Invalid input(s): APPERANCEUR    Imaging:  No results found.   Assessment & Plan: Mr. KARION CUDD is a 38 y.o. black male with end stage renal disease on hemodialysis, depression, diabetes mellitus type I insulin dependent, and hypertension , who was transferred to [facility] behavioral health on 03/28/2018   TTS [facility]) [place] Left AVF  1. End Stage Renal Disease: seen and examined on hemodialysis. Tolerating treatment well.  UF goal of 4 liters  2. Hypertension: elevated. Home regimen of nifedipine, minoxidil, and isosorbide mononitrate.   3. Anemia of chronic kidney disease:  hemoglobin 10.6 No indication for ESA  4. Secondary Hyperparathyroidism: phosphorus at goal, 4.5 - Continue calcium acetate with meals.  - cinacalcet on dialysis days.   LOS: 1 Lutisha Knoche 1/23/202010:48 AM

## 2018-03-29 NOTE — Progress Notes (Signed)
Pt had low blood sugar this morning. Patient given 15 oz of carbohydrates. Blood sugar came up to 67 on recheck. Patient stated he felt fine and that he has low blood sugar and would contact staff if he was feeling dizzy or sick. Patient is going to eat breakfast at this time. Report given to day time RN by this Probation officer. Call to Dr at [place].

## 2018-03-29 NOTE — Plan of Care (Signed)
38 year old male, with a sullen affect. Patient really forwards very little, shrugs shoulders when asked questions. Extremely depressed  like state. Patient depressed due to being on dialysis and medical issues. Patient has been up ambulating in the hall to the dining room which is estimated 30 ft one way. Patient's medications were pushed back a little due to dialysis treatment today. Patient cooperative with medications. No self injurious behaviors.

## 2018-03-29 NOTE — BHH Counselor (Signed)
CSW attempted to complete assessment with pt, however pt was asleep and would not wake up beyond speaking in mumbles.  CSW will try again later.   Assunta Curtis, MSW, LCSW 03/29/2018 2:19 PM

## 2018-03-29 NOTE — Progress Notes (Signed)
Inpatient Diabetes Program Recommendations  AACE/ADA: New Consensus Statement on Inpatient Glycemic Control (2015)  Target Ranges:  Prepandial:   less than 140 mg/dL      Peak postprandial:   less than 180 mg/dL (1-2 hours)      Critically ill patients:  140 - 180 mg/dL   Lab Results  Component Value Date   GLUCAP 185 (H) 03/29/2018   HGBA1C 9.1 (H) 02/13/2018    Review of Glycemic ControlResults for Edward Colon, Edward Colon (MRN [id number]) as of 03/29/2018 09:20  Ref. Range 03/28/2018 20:03 03/28/2018 21:53 03/29/2018 07:18 03/29/2018 07:36 03/29/2018 08:50  Glucose-Capillary Latest Ref Range: 70 - 99 mg/dL 80 42 (LL) 56 (L) 67 (L) 185 (H)    Diabetes history: DM 1 Outpatient Diabetes medications:Per patient report: Lantus 25 units QHS, Novolog "approximately 3 units with meals" Current orders for Inpatient glycemic control:  Novolog sensitive tid with meals Lantus 14 units daily and Lantus 8 units q HS Novolog 5 units tid with meals  Inpatient Diabetes Program Recommendations:    -Please consider reducing Novolog correction to custom scale (151-200 mg/dL- 1 unit, 201-250 mg/dL-2 units, 251-300 mg/dL- 3 units, 301-350 mg/dL- 4 units, 351-400 mg/dL- 5 units, >401- Give 6 units and Notify MD) -Reduce Novolog meal coverage to 4 units tid with meals -Reduce AM Lantus to 12 units q AM  Thanks,  Adah Perl, RN, BC-ADM Inpatient Diabetes Coordinator Pager [phone] (8a-5p)

## 2018-03-29 NOTE — Progress Notes (Signed)
Recreation Therapy Notes   Date: 03/29/2018  Time: 9:30 am   Location: Craft room   Behavioral response: N/A   Intervention Topic: Time Management  Discussion/Intervention: Patient did not attend group.   Clinical Observations/Feedback:  Patient did not attend group.   Praveen Coia LRT/CTRS         Imir Brumbach 03/29/2018 11:38 AM

## 2018-03-29 NOTE — H&P (Signed)
Psychiatric Admission Assessment Adult  Patient Identification: Edward Colon MRN:  [id number] Date of Evaluation:  03/29/2018 Chief Complaint:  Major Depression Principal Diagnosis: MDD (major depressive disorder), recurrent severe, without psychosis ([place]) Diagnosis:  Principal Problem:   MDD (major depressive disorder), recurrent severe, without psychosis ([place]) Active Problems:   Seizures ([place])   Hypertension   Type 1 diabetes ([place])   Acute on chronic renal failure ([place])  History of Present Illness: Patient with multiple medical problems and a history of depression transferred from [facility] because of concerns about depression and possible suicidality.  Admitted to the hospital in [place] a few days ago with hypoglycemia.  Indicated symptoms of depression on evaluation.  Seems to be still unclear whether he had attempted suicide this time or not.  Today the patient would not cooperate with my attempt to interview him.  Would not make eye contact would not speak coherently to me.  There is a known history of past depression.  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 Yes.    Has the patient been a risk to self in the past 6 months? Yes.    Has the patient been a risk to self within the distant past? Yes.    Is the patient a risk to others? No.  Has the patient been a risk to others in the past 6 months? No.  Has the patient  been a risk to others within the distant past? No.   Prior Inpatient Therapy:   Prior Outpatient Therapy:    Alcohol Screening: 1. How often do you have a drink containing alcohol?: Never 2. How many drinks containing alcohol do you have on a typical day when you are drinking?: 1 or 2 3. How often do you have six or more drinks on one occasion?: Never AUDIT-C Score: 0 4. How often during the last year have you found that you were not able to stop drinking once you had started?: Never 5. How often during the last year have you failed to do what was normally expected from you becasue of drinking?: Never 6. How often during the last year have you needed a first drink in the morning to get yourself going after a heavy drinking session?: Never 7. How often during the last year have you had a feeling of guilt of remorse after drinking?: Never 8. How often during the last year have you been unable to remember what happened the night before because you had been drinking?: Never 9. Have you or someone else been injured as a result of your drinking?: No 10. Has a relative or friend or a doctor or another health worker been concerned about your drinking or suggested you cut down?: No Alcohol Use Disorder Identification Test Final Score (AUDIT): 0 Alcohol Brief Interventions/Follow-up: AUDIT Score <7 follow-up not indicated Substance Abuse History in the last 12 months:  No. Consequences of Substance Abuse: Negative Previous Psychotropic Medications: Yes  Psychological Evaluations: Yes  Past Medical History:  Past Medical History:  Diagnosis  Date  . Anemia   . Blind left eye since ~ 2010  . Depression   . Diabetic peripheral neuropathy ([place])   . ESRD (end stage renal disease) on dialysis [facility])    "TTS; [place] [facility]; [facility]" (02/01/2016)  . Heart murmur    denies any problems with it  . Hypertension   . Seizures ([place])    "last one was end of 2016; they are related to my diabetes" (02/01/2016)   . Type 1 diabetes ([place]) dx'd 1990    Past Surgical History:  Procedure Laterality Date  . AV FISTULA PLACEMENT Right 03/03/2015   Procedure: RIGHT RADIO-CEPHALIC ARTERIOVENOUS (AV) FISTULA CREATION;  Surgeon: Serafina Mitchell, MD;  Location: [facility] OR;  Service: Vascular;  Laterality: Right;  . AV FISTULA PLACEMENT Left 06/15/2015   Procedure: INSERTION OF LEFT UPPER ARM  ARTERIOVENOUS (AV) 58mm x 50cm GORE-TEX GRAFT;  Surgeon: Elam Dutch, MD;  Location: [place];  Service: Vascular;  Laterality: Left;  . BASCILIC VEIN TRANSPOSITION Left 04/01/2015   Procedure: BASCILIC VEIN TRANSPOSITION-LEFT ARM- FIRST STAGE;  Surgeon: Elam Dutch, MD;  Location: [place];  Service: Vascular;  Laterality: Left;  . EYE SURGERY Right ~ 2010   for diabetic retinopathy  . INSERTION OF DIALYSIS CATHETER Right 03/03/2015   Procedure: INSERTION OF DIALYSIS CATHETER;  Surgeon: Serafina Mitchell, MD;  Location: [facility] OR;  Service: Vascular;  Laterality: Right;   Family History:  Family History  Problem Relation Age of Onset  . Diabetes Neg Hx    Family Psychiatric  History: Unknown Tobacco Screening: Have you used any form of tobacco in the last 30 days? (Cigarettes, Smokeless Tobacco, Cigars, and/or Pipes): No Social History:  Social History   Substance and Sexual Activity  Alcohol Use No  . Frequency: Never   Comment: 02/01/2016 "might have 1 drink/year"     Social History   Substance and Sexual Activity  Drug Use No    Additional Social History:                           Allergies:  No Known Allergies Lab Results:  Results for orders placed or performed during the hospital encounter of 03/28/18 (from the past 48 hour(s))  Glucose, capillary     Status: Abnormal   Collection Time: 03/28/18  9:53 PM  Result Value Ref Range   Glucose-Capillary 42 (LL) 70 - 99 mg/dL   Comment 1 Notify RN   Glucose, capillary     Status: Abnormal   Collection Time: 03/29/18  7:18 AM  Result Value Ref Range    Glucose-Capillary 56 (L) 70 - 99 mg/dL  Glucose, capillary     Status: Abnormal   Collection Time: 03/29/18  7:36 AM  Result Value Ref Range   Glucose-Capillary 67 (L) 70 - 99 mg/dL  Glucose, capillary     Status: Abnormal   Collection Time: 03/29/18  8:50 AM  Result Value Ref Range   Glucose-Capillary 185 (H) 70 - 99 mg/dL  Renal function panel     Status: Abnormal   Collection Time: 03/29/18  9:00 AM  Result Value Ref Range   Sodium 133 (L) 135 - 145 mmol/L   Potassium 5.2 (H) 3.5 - 5.1 mmol/L   Chloride 95 (L) 98 - 111 mmol/L   CO2 25 22 - 32 mmol/L   Glucose, Bld 247 (H) 70 - 99 mg/dL   BUN 71 (H) 6 - 20 mg/dL  Creatinine, Ser 13.65 (H) 0.61 - 1.24 mg/dL   Calcium 8.3 (L) 8.9 - 10.3 mg/dL   Phosphorus 4.5 2.5 - 4.6 mg/dL   Albumin 3.5 3.5 - 5.0 g/dL   GFR calc non Af Amer 4 (L) >60 mL/min   GFR calc Af Amer 5 (L) >60 mL/min   Anion gap 13 5 - 15    Comment: Performed at [facility], [address] [place], [place] [address]  CBC     Status: Abnormal   Collection Time: 03/29/18  9:00 AM  Result Value Ref Range   WBC 4.2 4.0 - 10.5 K/uL   RBC 3.74 (L) 4.22 - 5.81 MIL/uL   Hemoglobin 10.6 (L) 13.0 - 17.0 g/dL   HCT 32.3 (L) 39.0 - 52.0 %   MCV 86.4 80.0 - 100.0 fL   MCH 28.3 26.0 - 34.0 pg   MCHC 32.8 30.0 - 36.0 g/dL   RDW 12.9 11.5 - 15.5 %   Platelets 329 150 - 400 K/uL   nRBC 0.0 0.0 - 0.2 %    Comment: Performed at [facility], [address] [place], [place] [address]  Glucose, capillary     Status: Abnormal   Collection Time: 03/29/18  3:07 PM  Result Value Ref Range   Glucose-Capillary 247 (H) 70 - 99 mg/dL  Glucose, capillary     Status: Abnormal   Collection Time: 03/29/18  5:26 PM  Result Value Ref Range   Glucose-Capillary 177 (H) 70 - 99 mg/dL    Blood Alcohol level:  Lab Results  Component Value Date   ETH <10 03/12/2018   ETH <10 83/41/9622    Metabolic Disorder Labs:  Lab Results  Component Value Date   HGBA1C  9.1 (H) 02/13/2018   MPG 214.47 02/13/2018   MPG 249 08/31/2014   No results found for: PROLACTIN Lab Results  Component Value Date   TRIG 221 (H) 02/10/2018    Current Medications: Current Facility-Administered Medications  Medication Dose Route Frequency Provider Last Rate Last Dose  . acetaminophen (TYLENOL) tablet 650 mg  650 mg Oral Q4H PRN [name], MD      . alum & mag hydroxide-simeth (MAALOX/MYLANTA) 200-200-20 MG/5ML suspension 30 mL  30 mL Oral Q4H PRN [name], MD      . calcium acetate (PHOSLO) capsule 2,001 mg  2,001 mg Oral TID WC [name], MD   2,001 mg at 03/29/18 1734  . Chlorhexidine Gluconate Cloth 2 % PADS 6 each  6 each Topical Q0600 [name], MD      . cinacalcet [facility]) tablet 90 mg  90 mg Oral Q T,Th,Sa-HD [name], MD   90 mg at 03/29/18 1733  . feeding supplement (NEPRO CARB STEADY) liquid 237 mL  237 mL Oral q morning - 10a [name], MD      . feeding supplement (PRO-STAT SUGAR FREE 64) liquid 30 mL  30 mL Oral BID PC [name], MD      . FLUoxetine (PROZAC) capsule 20 mg  20 mg Oral Daily [name], MD   20 mg at 03/29/18 1734  . heparin injection 5,000 Units  5,000 Units Subcutaneous Q8H [name], MD   5,000 Units at 03/29/18 0630  . insulin aspart (novoLOG) injection 0-9 Units  0-9 Units Subcutaneous TID WC [name], MD   2 Units at 03/29/18 1747  . insulin aspart (novoLOG) injection 5 Units  5  Units Subcutaneous TID WC [name], MD   5 Units at 03/29/18 1746  . insulin glargine (LANTUS) injection 14 Units  14 Units Subcutaneous Daily [name], MD   14 Units at 03/29/18 [phone]  . insulin glargine (LANTUS) injection 8 Units  8 Units Subcutaneous QHS [name], MD      . isosorbide-hydrALAZINE (BIDIL) 20-37.5 MG per tablet 1 tablet  1 tablet Oral TID [name], MD   1 tablet at 03/29/18 1733  . levETIRAcetam (KEPPRA) tablet 250 mg  250 mg Oral Q  T,Th,Sat-1800 [name], MD   250 mg at 03/29/18 1734  . levETIRAcetam (KEPPRA) tablet 500 mg  500 mg Oral Q0600 [name], MD   500 mg at 03/29/18 2542  . magnesium hydroxide (MILK OF MAGNESIA) suspension 30 mL  30 mL Oral Daily PRN [name], MD      . MEDLINE mouth rinse  15 mL Mouth Rinse BID [name], MD   Stopped at 03/29/18 [phone]  . minoxidil (LONITEN) tablet 2.5 mg  2.5 mg Oral q1800 Kolluru, Sarath, MD   2.5 mg at 03/29/18 1733  . multivitamin (RENA-VIT) tablet 1 tablet  1 tablet Oral QHS [name], MD      . NIFEdipine (PROCARDIA-XL/NIFEDICAL-XL) 24 hr tablet 30 mg  30 mg Oral BID [name], MD   30 mg at 03/29/18 1733  . ondansetron (ZOFRAN) injection 4 mg  4 mg Intravenous Q6H PRN [name], MD      . protein supplement (ENSURE MAX) liquid  11 oz Oral Daily [name], MD   11 oz at 03/29/18 1737   PTA Medications: Medications Prior to Admission  Medication Sig Dispense Refill Last Dose  . calcium acetate (PHOSLO) 667 MG capsule Take 1 capsule (667 mg total) by mouth 3 (three) times daily with meals. (Patient taking differently: Take [phone] mg by mouth See admin instructions. Take 4 capsules (2668 mg) by mouth with meals and 2 capsules (1334 mg) with snacks) 90 capsule 0 03/11/2018 at Unknown time  . FLUoxetine (PROZAC) 20 MG capsule Take 1 capsule (20 mg total) by mouth daily.  3   . insulin aspart (NOVOLOG) 100 UNIT/ML injection Inject 0-9 Units into the skin 3 (three) times daily with meals for 30 days. For glucose 121 to 150 use one unit, for 151 to 200 use two units, for 201 to 250 use three units, for 251 to 300 use five units, for 301 to 350 use seven units, for 351 or greater use 9 units. 8.1 mL 0   . insulin glargine (LANTUS) 100 UNIT/ML injection Inject 0.14 mLs (14 Units total) into the skin every morning. 10 mL 11   . insulin glargine (LANTUS) 100 UNIT/ML injection Inject 0.08 mLs (8 Units total) into the skin at  bedtime. 10 mL 11   . isosorbide-hydrALAZINE (BIDIL) 20-37.5 MG tablet Take 1 tablet by mouth 3 (three) times daily. 90 tablet 0   . levETIRAcetam (KEPPRA) 250 MG tablet Take 1 tablet (250 mg total) by mouth every Tuesday, Thursday, and Saturday at 6 PM for 30 days. 12 tablet 0   . levETIRAcetam (KEPPRA) 500 MG tablet Take 1 tablet (500 mg total) by mouth daily at 6 (six) AM for 30 days. 30 tablet 0   . minoxidil (LONITEN) 2.5 MG tablet Take 2.5 mg by mouth 2 (two) times daily.     . multivitamin (RENA-VIT) TABS tablet Take  1 tablet by mouth at bedtime. (Patient taking differently: Take 1 tablet by mouth daily. ) 30 tablet 0 02/08/2018 at Unknown time  . NIFEdipine (ADALAT CC) 30 MG 24 hr tablet Take 1 tablet (30 mg total) by mouth 2 (two) times daily. 60 tablet 0     Musculoskeletal: Strength & Muscle Tone: decreased Gait & Station: unable to stand Patient leans: N/A  Psychiatric Specialty Exam: Physical Exam  Nursing note and vitals reviewed. Constitutional: He appears well-developed and well-nourished.  HENT:  Head: Normocephalic and atraumatic.  Eyes: Pupils are equal, round, and reactive to light. Conjunctivae are normal.  Neck: Normal range of motion.  Cardiovascular: Regular rhythm and normal heart sounds.  Respiratory: Effort normal. No respiratory distress.  GI: Soft.  Musculoskeletal: Normal range of motion.  Neurological: He is alert.  Skin: Skin is warm and dry.  Psychiatric: His affect is blunt. He is withdrawn. He is noncommunicative.    Review of Systems  Unable to perform ROS: Patient unresponsive    Blood pressure 138/76, pulse (!) 102, temperature 98.2 F (36.8 C), temperature source Oral, resp. rate 15, height 6' (1.829 m), weight 86.3 kg, SpO2 99 %.Body mass index is 25.8 kg/m.  General Appearance: Disheveled  Eye Contact:  None  Speech:  Negative  Volume:  Decreased  Mood:  Negative  Affect:  Negative  Thought Process:  NA  Orientation:  Negative   Thought Content:  Negative  Suicidal Thoughts:  No  Homicidal Thoughts:  No  Memory:  Negative  Judgement:  Negative  Insight:  Negative  Psychomotor Activity:  Negative  Concentration:  Concentration: Negative  Recall:  Negative  Fund of Knowledge:  Negative  Language:  Negative  Akathisia:  Negative  Handed:  Right  AIMS (if indicated):     Assets:  Housing  ADL's:  Impaired  Cognition:  Impaired,  Moderate  Sleep:  Number of Hours: 6.25    Treatment Plan Summary: Plan Continue medical treatment.  Continue attempts to get a full psychiatric evaluation.  Continue 15-minute checks.  Observation Level/Precautions:  15 minute checks  Laboratory:  HbAIC  Psychotherapy:    Medications:    Consultations:    Discharge Concerns:    Estimated LOS:  Other:     Physician Treatment Plan for Primary Diagnosis: MDD (major depressive disorder), recurrent severe, without psychosis ([place]) Long Term Goal(s): Improvement in symptoms so as ready for discharge  Short Term Goals: Ability to disclose and discuss suicidal ideas  Physician Treatment Plan for Secondary Diagnosis: Principal Problem:   MDD (major depressive disorder), recurrent severe, without psychosis ([place]) Active Problems:   Seizures ([place])   Hypertension   Type 1 diabetes ([place])   Acute on chronic renal failure ([place])  Long Term Goal(s): Improvement in symptoms so as ready for discharge  Short Term Goals: Ability to identify and develop effective coping behaviors will improve  I certify that inpatient services furnished can reasonably be expected to improve the patient's condition.    Alethia Berthold, MD 1/23/20207:10 PM

## 2018-03-29 NOTE — Progress Notes (Signed)
Patient ID: Edward Colon, male   DOB: 1981-01-01, 38 y.o.   MRN: [id number] Per State regulations 482.30 this chart was reviewed for medical necessity with respect to the patient's admission/duration of stay.    Next review date: 04/01/2018  Debarah Crape, BSN, RN-BC  Case Manager

## 2018-03-29 NOTE — Progress Notes (Signed)
Tolerated dialysis well. UF 3.5L today. BP remained stable throughout. System clotted x1 (venous chamber filter only)all blood able to be returned and treatment restarted. Patient currently without complaints. Flat affect. Lungs clear. No notable edema. Returned to [employer] with Retail banker.

## 2018-03-29 NOTE — BHH Suicide Risk Assessment (Signed)
[place] Admission Suicide Risk Assessment   Nursing information obtained from:  Patient Demographic factors:  Living alone, Male, Unemployed Current Mental Status:  Suicidal ideation indicated by others Loss Factors:  Decline in physical health Historical Factors:  NA Risk Reduction Factors:  NA  Total Time spent with patient: 1 hour Principal Problem: MDD (major depressive disorder), recurrent severe, without psychosis ([place]) Diagnosis:  Principal Problem:   MDD (major depressive disorder), recurrent severe, without psychosis ([place]) Active Problems:   Seizures ([place])   Hypertension   Type 1 diabetes ([place])   Acute on chronic renal failure ([place])  Subjective Data: This is a 38 year old man with multiple severe medical problems who was transferred to [place] from [facility] because of concern about depression in the context of his multiple medical problems.  Attempted to interview patient today.  Patient was not able to participate in the interview.  He kept his face firmly in a pillow and muttered answers which I could not understand and then began to ignore me.  Patient did receive hemodialysis today.  Blood sugars are moderately elevated.  Blood pressure had been a little high.  Labs reviewed.  No aggression or violence currently.  Social history: Limited information.  Possibly living by himself.  Major medical problems  Medical history: End-stage renal disease on dialysis.  Hypertension diabetes history of seizure disorder  Substance abuse history: Limited information available.  Continued Clinical Symptoms:  Alcohol Use Disorder Identification Test Final Score (AUDIT): 0 The "Alcohol Use Disorders Identification Test", Guidelines for Use in Primary Care, Second Edition.  World Pharmacologist [facility]). Score between 0-7:  no or low risk or alcohol related problems. Score between 8-15:  moderate risk of alcohol related problems. Score between 16-19:  high risk of alcohol related  problems. Score 20 or above:  warrants further diagnostic evaluation for alcohol dependence and treatment.   CLINICAL FACTORS:   Depression:   Anhedonia Hopelessness   Musculoskeletal: Strength & Muscle Tone: decreased Gait & Station: ataxic Patient leans: N/A  Psychiatric Specialty Exam: Physical Exam  Nursing note and vitals reviewed. Constitutional: He appears well-developed and well-nourished.  HENT:  Head: Normocephalic and atraumatic.  Eyes: Pupils are equal, round, and reactive to light. Conjunctivae are normal.  Neck: Normal range of motion.  Cardiovascular: Regular rhythm and normal heart sounds.  Respiratory: Effort normal. No respiratory distress.  GI: Soft.  Musculoskeletal: Normal range of motion.  Neurological: He is alert.  Skin: Skin is warm and dry.  Psychiatric: His affect is blunt. He is withdrawn. He is noncommunicative.    Review of Systems  Unable to perform ROS: Psychiatric disorder    Blood pressure 138/76, pulse (!) 102, temperature 98.2 F (36.8 C), temperature source Oral, resp. rate 15, height 6' (1.829 m), weight 86.3 kg, SpO2 99 %.Body mass index is 25.8 kg/m.  General Appearance: Disheveled  Eye Contact:  None  Speech:  Negative  Volume:  Decreased  Mood:  Negative  Affect:  Negative  Thought Process:  Disorganized  Orientation:  Negative  Thought Content:  Negative  Suicidal Thoughts:  Unknown.  I think this is still unclear  Homicidal Thoughts:  No  Memory:  Negative  Judgement:  Negative  Insight:  Negative  Psychomotor Activity:  Decreased  Concentration:  Concentration: Negative  Recall:  Negative  Fund of Knowledge:  Negative  Language:  Negative  Akathisia:  Negative  Handed:  Right  AIMS (if indicated):     Assets:  Resilience  ADL's:  Impaired  Cognition:  Impaired,  Mild  Sleep:  Number of Hours: 6.25      COGNITIVE FEATURES THAT CONTRIBUTE TO RISK:  Closed-mindedness    SUICIDE RISK:   Mild:  Suicidal  ideation of limited frequency, intensity, duration, and specificity.  There are no identifiable plans, no associated intent, mild dysphoria and related symptoms, good self-control (both objective and subjective assessment), few other risk factors, and identifiable protective factors, including available and accessible social support.  PLAN OF CARE: Unclear what the level of risk is but the patient does have a past history of suicide attempts.  Continue attempting full evaluation and treatment plan.  I certify that inpatient services furnished can reasonably be expected to improve the patient's condition.   Alethia Berthold, MD 03/29/2018, 7:06 PM

## 2018-03-29 NOTE — Plan of Care (Signed)
Patient newly admitted, hasn't had time to progress.   Problem: Safety: Goal: Periods of time without injury will increase Outcome: Not Progressing   Problem: Safety: Goal: Periods of time without injury will increase 03/29/2018 0256 by Mallie Darting, RN Outcome: Not Progressing 03/29/2018 0256 by Mallie Darting, RN Outcome: Not Progressing   Problem: Self-Concept: Goal: Ability to disclose and discuss suicidal ideas will improve 03/29/2018 0256 by Mallie Darting, RN Outcome: Not Progressing 03/29/2018 0256 by Mallie Darting, RN Outcome: Not Progressing Goal: Will verbalize positive feelings about self 03/29/2018 0256 by Mallie Darting, RN Outcome: Not Progressing 03/29/2018 0256 by Mallie Darting, RN Outcome: Not Progressing

## 2018-03-29 NOTE — Progress Notes (Signed)
Blood sugar at 2044 was 54. Patient stated he was not dizzy and that he felt fine. Patient is aware of his low blood sugar and can tell when its "really" bad to him. Patient given 15 oz of carbohydrates and is getting night time snack. Patient rechecked at 2130 was 146. Dr. Weber Cooks called at 2148.

## 2018-03-29 NOTE — Tx Team (Signed)
Initial Treatment Plan 03/29/2018 3:06 AM Edward Colon WHK:[id number]    PATIENT STRESSORS: Health problems Medication change or noncompliance   PATIENT STRENGTHS: Ability for insight Motivation for treatment/growth   PATIENT IDENTIFIED PROBLEMS: Suicidal ideation  Depression                   DISCHARGE CRITERIA:  Improved stabilization in mood, thinking, and/or behavior Motivation to continue treatment in a less acute level of care  PRELIMINARY DISCHARGE PLAN: Outpatient therapy Return to previous living arrangement  PATIENT/FAMILY INVOLVEMENT: This treatment plan has been presented to and reviewed with the patient, Edward Colon. The patient has been given the opportunity to ask questions and make suggestions.  Mallie Darting, RN 03/29/2018, 3:06 AM

## 2018-03-29 NOTE — Progress Notes (Signed)
Pre hd 

## 2018-03-30 LAB — GLUCOSE, CAPILLARY
Glucose-Capillary: 37 mg/dL — CL (ref 70–99)
Glucose-Capillary: 62 mg/dL — ABNORMAL LOW (ref 70–99)
Glucose-Capillary: 109 mg/dL — ABNORMAL HIGH (ref 70–99)
Glucose-Capillary: 334 mg/dL — ABNORMAL HIGH (ref 70–99)
Glucose-Capillary: 144 mg/dL — ABNORMAL HIGH (ref 70–99)
Glucose-Capillary: 280 mg/dL — ABNORMAL HIGH (ref 70–99)

## 2018-03-30 MED ORDER — INSULIN ASPART 100 UNIT/ML ~~LOC~~ SOLN
0.0000 [IU] | Freq: Three times a day (TID) | SUBCUTANEOUS | Status: DC
  Administered 2018-03-30: 4 [IU] via SUBCUTANEOUS
  Administered 2018-03-31: 3 [IU] via SUBCUTANEOUS
  Administered 2018-03-31: 4 [IU] via SUBCUTANEOUS
  Administered 2018-04-03: 5 [IU] via SUBCUTANEOUS
  Administered 2018-04-03: 1 [IU] via SUBCUTANEOUS
  Administered 2018-04-05: 3 [IU] via SUBCUTANEOUS
  Administered 2018-04-06: 2 [IU] via SUBCUTANEOUS
  Administered 2018-04-06: 5 [IU] via SUBCUTANEOUS
  Administered 2018-04-06: 2 [IU] via SUBCUTANEOUS
  Administered 2018-04-07: 3 [IU] via SUBCUTANEOUS
  Administered 2018-04-07 – 2018-04-08 (×3): 2 [IU] via SUBCUTANEOUS
  Administered 2018-04-10: 1 [IU] via SUBCUTANEOUS
  Administered 2018-04-10: 5 [IU] via SUBCUTANEOUS
  Administered 2018-04-11: 3 [IU] via SUBCUTANEOUS
  Administered 2018-04-11: 5 [IU] via SUBCUTANEOUS
  Filled 2018-03-30 (×3): qty 1

## 2018-03-30 MED ORDER — INSULIN GLARGINE 100 UNIT/ML ~~LOC~~ SOLN
12.0000 [IU] | Freq: Every day | SUBCUTANEOUS | Status: DC
  Administered 2018-03-31: 12 [IU] via SUBCUTANEOUS
  Filled 2018-03-30 (×2): qty 0.12

## 2018-03-30 MED ORDER — INSULIN ASPART 100 UNIT/ML ~~LOC~~ SOLN
4.0000 [IU] | Freq: Three times a day (TID) | SUBCUTANEOUS | Status: DC
  Administered 2018-03-30 – 2018-03-31 (×3): 4 [IU] via SUBCUTANEOUS

## 2018-03-30 NOTE — Progress Notes (Signed)
[facility] MD Progress Note  03/30/2018 4:23 PM ANES RIGEL  MRN:  [id number] Subjective: Follow-up for this patient with severe depression.  Patient did get up and participated in an interview with me today giving me much more information.  This gentleman was transferred to [place] from [facility] after presenting with hypoglycemia.  He tells me that he did not try to kill himself and had not tried to overdose on insulin but that his mood is extremely depressed.  He is feeling hopeless and negative and has been feeling bad for at least a couple months.  Energy level very low.  Not doing much to take care of himself at home.  Denies any hallucinations or psychotic symptoms.  Denies any substance abuse.  Had not been getting any mental health treatment outside the hospital.  Major life stresses are the medical problems particularly feeling overwhelmed by how difficult his diabetes is.  Medical history: Diabetes since childhood dialysis for about 3 years.  Seizures since sometime in childhood.  Seizures are currently under good control.  Blood sugars very poorly controlled. Principal Problem: MDD (major depressive disorder), recurrent severe, without psychosis ([place]) Diagnosis: Principal Problem:   MDD (major depressive disorder), recurrent severe, without psychosis ([place]) Active Problems:   Seizures ([place])   Hypertension   Type 1 diabetes ([place])   Acute on chronic renal failure ([place])  Total Time spent with patient: 30 minutes  Past Psychiatric History: Patient apparently has never had inpatient treatment.  There was a previous psychiatric evaluation in 2017 after an overdose but at that time he was not admitted to the hospital.  He says he never followed up with outpatient mental health treatment at that time even though that was a suicide attempt.  Past Medical History:  Past Medical History:  Diagnosis Date  . Anemia   . Blind left eye since ~ 2010  . Depression   . Diabetic peripheral neuropathy  ([place])   . ESRD (end stage renal disease) on dialysis [facility])    "TTS; [place] [facility]; [facility]" (02/01/2016)  . Heart murmur    denies any problems with it  . Hypertension   . Seizures ([place])    "last one was end of 2016; they are related to my diabetes" (02/01/2016)  . Type 1 diabetes ([place]) dx'd 1990    Past Surgical History:  Procedure Laterality Date  . AV FISTULA PLACEMENT Right 03/03/2015   Procedure: RIGHT RADIO-CEPHALIC ARTERIOVENOUS (AV) FISTULA CREATION;  Surgeon: Serafina Mitchell, MD;  Location: [facility] OR;  Service: Vascular;  Laterality: Right;  . AV FISTULA PLACEMENT Left 06/15/2015   Procedure: INSERTION OF LEFT UPPER ARM  ARTERIOVENOUS (AV) 29mm x 50cm GORE-TEX GRAFT;  Surgeon: Elam Dutch, MD;  Location: [place];  Service: Vascular;  Laterality: Left;  . BASCILIC VEIN TRANSPOSITION Left 04/01/2015   Procedure: BASCILIC VEIN TRANSPOSITION-LEFT ARM- FIRST STAGE;  Surgeon: Elam Dutch, MD;  Location: [place];  Service: Vascular;  Laterality: Left;  . EYE SURGERY Right ~ 2010   for diabetic retinopathy  . INSERTION OF DIALYSIS CATHETER Right 03/03/2015   Procedure: INSERTION OF DIALYSIS CATHETER;  Surgeon: Serafina Mitchell, MD;  Location: [facility] OR;  Service: Vascular;  Laterality: Right;   Family History:  Family History  Problem Relation Age of Onset  . Diabetes Neg Hx    Family Psychiatric  History: Does not know of any Social History:  Social History   Substance and Sexual Activity  Alcohol Use No  . Frequency:  Never   Comment: 02/01/2016 "might have 1 drink/year"     Social History   Substance and Sexual Activity  Drug Use No    Social History   Socioeconomic History  . Marital status: Single    Spouse name: Not on file  . Number of children: Not on file  . Years of education: Not on file  . Highest education level: Not on file  Occupational History  . Not on file  Social Needs  . Financial resource strain: Not on file  . Food insecurity:    Worry: Not on  file    Inability: Not on file  . Transportation needs:    Medical: Not on file    Non-medical: Not on file  Tobacco Use  . Smoking status: Never Smoker  . Smokeless tobacco: Never Used  Substance and Sexual Activity  . Alcohol use: No    Frequency: Never    Comment: 02/01/2016 "might have 1 drink/year"  . Drug use: No  . Sexual activity: Yes  Lifestyle  . Physical activity:    Days per week: Not on file    Minutes per session: Not on file  . Stress: Not on file  Relationships  . Social connections:    Talks on phone: Not on file    Gets together: Not on file    Attends religious service: Not on file    Active member of club or organization: Not on file    Attends meetings of clubs or organizations: Not on file    Relationship status: Not on file  Other Topics Concern  . Not on file  Social History Narrative  . Not on file   Additional Social History:                         Sleep: Fair  Appetite:  Fair  Current Medications: Current Facility-Administered Medications  Medication Dose Route Frequency Provider Last Rate Last Dose  . acetaminophen (TYLENOL) tablet 650 mg  650 mg Oral Q4H PRN [name], MD      . alum & mag hydroxide-simeth (MAALOX/MYLANTA) 200-200-20 MG/5ML suspension 30 mL  30 mL Oral Q4H PRN [name], MD      . calcium acetate (PHOSLO) capsule 2,001 mg  2,001 mg Oral TID WC [name], MD   2,001 mg at 03/30/18 1133  . Chlorhexidine Gluconate Cloth 2 % PADS 6 each  6 each Topical Q0600 [name], MD      . cinacalcet [facility]) tablet 90 mg  90 mg Oral Q T,Th,Sa-HD [name], MD   90 mg at 03/29/18 1733  . feeding supplement (NEPRO CARB STEADY) liquid 237 mL  237 mL Oral q morning - 10a [name], MD   237 mL at 03/30/18 1000  . feeding supplement (PRO-STAT SUGAR FREE 64) liquid 30 mL  30 mL Oral BID PC [name], MD      . FLUoxetine (PROZAC) capsule 20 mg  20 mg Oral Daily [name], MD    20 mg at 03/30/18 7169  . insulin aspart (novoLOG) injection 0-5 Units  0-5 Units Subcutaneous TID WC [name], [name], MD   4 Units at 03/30/18 1134  . insulin aspart (novoLOG) injection 4 Units  4 Units Subcutaneous TID WC [name], [name], MD   4 Units at 03/30/18 1138  . [START ON 03/31/2018] insulin glargine (LANTUS) injection 12 Units  12 Units  Subcutaneous Daily [name], [name], MD      . insulin glargine (LANTUS) injection 8 Units  8 Units Subcutaneous QHS [name], MD   8 Units at 03/29/18 2132  . isosorbide-hydrALAZINE (BIDIL) 20-37.5 MG per tablet 1 tablet  1 tablet Oral TID [name], MD   1 tablet at 03/30/18 1133  . levETIRAcetam (KEPPRA) tablet 250 mg  250 mg Oral Q T,Th,Sat-1800 [name], MD   250 mg at 03/29/18 1734  . levETIRAcetam (KEPPRA) tablet 500 mg  500 mg Oral Q0600 [name], MD   500 mg at 03/30/18 0636  . minoxidil (LONITEN) tablet 2.5 mg  2.5 mg Oral q1800 Kolluru, Sarath, MD   2.5 mg at 03/29/18 1733  . multivitamin (RENA-VIT) tablet 1 tablet  1 tablet Oral QHS [name], MD   1 tablet at 03/29/18 2132  . NIFEdipine (PROCARDIA-XL/NIFEDICAL-XL) 24 hr tablet 30 mg  30 mg Oral BID [name], MD   30 mg at 03/30/18 7412  . protein supplement (ENSURE MAX) liquid  11 oz Oral Daily [name], MD   11 oz at 03/30/18 1359    Lab Results:  Results for orders placed or performed during the hospital encounter of 03/28/18 (from the past 48 hour(s))  Glucose, capillary     Status: Abnormal   Collection Time: 03/28/18  9:53 PM  Result Value Ref Range   Glucose-Capillary 42 (LL) 70 - 99 mg/dL   Comment 1 Notify RN   Glucose, capillary     Status: Abnormal   Collection Time: 03/29/18  7:18 AM  Result Value Ref Range   Glucose-Capillary 56 (L) 70 - 99 mg/dL  Glucose, capillary     Status: Abnormal   Collection Time: 03/29/18  7:36 AM  Result Value Ref Range   Glucose-Capillary 67 (L) 70 - 99 mg/dL  Glucose, capillary      Status: Abnormal   Collection Time: 03/29/18  8:50 AM  Result Value Ref Range   Glucose-Capillary 185 (H) 70 - 99 mg/dL  Renal function panel     Status: Abnormal   Collection Time: 03/29/18  9:00 AM  Result Value Ref Range   Sodium 133 (L) 135 - 145 mmol/L   Potassium 5.2 (H) 3.5 - 5.1 mmol/L   Chloride 95 (L) 98 - 111 mmol/L   CO2 25 22 - 32 mmol/L   Glucose, Bld 247 (H) 70 - 99 mg/dL   BUN 71 (H) 6 - 20 mg/dL   Creatinine, Ser 13.65 (H) 0.61 - 1.24 mg/dL   Calcium 8.3 (L) 8.9 - 10.3 mg/dL   Phosphorus 4.5 2.5 - 4.6 mg/dL   Albumin 3.5 3.5 - 5.0 g/dL   GFR calc non Af Amer 4 (L) >60 mL/min   GFR calc Af Amer 5 (L) >60 mL/min   Anion gap 13 5 - 15    Comment: Performed at [facility], [address] [place], [place] [address]  CBC     Status: Abnormal   Collection Time: 03/29/18  9:00 AM  Result Value Ref Range   WBC 4.2 4.0 - 10.5 K/uL   RBC 3.74 (L) 4.22 - 5.81 MIL/uL   Hemoglobin 10.6 (L) 13.0 - 17.0 g/dL   HCT 32.3 (L) 39.0 - 52.0 %   MCV 86.4 80.0 - 100.0 fL   MCH 28.3 26.0 - 34.0 pg   MCHC 32.8 30.0 - 36.0 g/dL   RDW 12.9 11.5 - 15.5 %  Platelets 329 150 - 400 K/uL   nRBC 0.0 0.0 - 0.2 %    Comment: Performed at [facility], [address] [place],  [address]  Glucose, capillary     Status: Abnormal   Collection Time: 03/29/18  3:07 PM  Result Value Ref Range   Glucose-Capillary 247 (H) 70 - 99 mg/dL  Glucose, capillary     Status: Abnormal   Collection Time: 03/29/18  5:26 PM  Result Value Ref Range   Glucose-Capillary 177 (H) 70 - 99 mg/dL  Glucose, capillary     Status: Abnormal   Collection Time: 03/29/18  8:44 PM  Result Value Ref Range   Glucose-Capillary 54 (L) 70 - 99 mg/dL   Comment 1 Notify RN   Glucose, capillary     Status: Abnormal   Collection Time: 03/29/18  9:38 PM  Result Value Ref Range   Glucose-Capillary 146 (H) 70 - 99 mg/dL   Comment 1 Notify RN   Glucose, capillary     Status: Abnormal   Collection  Time: 03/30/18  6:37 AM  Result Value Ref Range   Glucose-Capillary 37 (LL) 70 - 99 mg/dL   Comment 1 Notify RN   Glucose, capillary     Status: Abnormal   Collection Time: 03/30/18  6:57 AM  Result Value Ref Range   Glucose-Capillary 62 (L) 70 - 99 mg/dL   Comment 1 Notify RN   Glucose, capillary     Status: Abnormal   Collection Time: 03/30/18  7:36 AM  Result Value Ref Range   Glucose-Capillary 109 (H) 70 - 99 mg/dL  Glucose, capillary     Status: Abnormal   Collection Time: 03/30/18 11:28 AM  Result Value Ref Range   Glucose-Capillary 334 (H) 70 - 99 mg/dL  Glucose, capillary     Status: Abnormal   Collection Time: 03/30/18  4:11 PM  Result Value Ref Range   Glucose-Capillary 144 (H) 70 - 99 mg/dL   Comment 1 Notify RN    Comment 2 Document in Chart     Blood Alcohol level:  Lab Results  Component Value Date   ETH <10 03/12/2018   ETH <10 11/27/3005    Metabolic Disorder Labs: Lab Results  Component Value Date   HGBA1C 9.1 (H) 02/13/2018   MPG 214.47 02/13/2018   MPG 249 08/31/2014   No results found for: PROLACTIN Lab Results  Component Value Date   TRIG 221 (H) 02/10/2018    Physical Findings: AIMS:  , ,  ,  ,    CIWA:    COWS:     Musculoskeletal: Strength & Muscle Tone: decreased Gait & Station: normal Patient leans: N/A  Psychiatric Specialty Exam: Physical Exam  Nursing note and vitals reviewed. Constitutional: He appears well-developed and well-nourished.  HENT:  Head: Normocephalic and atraumatic.  Eyes: Pupils are equal, round, and reactive to light. Conjunctivae are normal.  Neck: Normal range of motion.  Cardiovascular: Regular rhythm and normal heart sounds.  Respiratory: Effort normal. No respiratory distress.  GI: Soft.  Musculoskeletal: Normal range of motion.  Neurological: He is alert.  Skin: Skin is warm and dry.  Psychiatric: Judgment normal. His affect is blunt. His speech is delayed. He is slowed and withdrawn. Thought  content is not paranoid. Cognition and memory are impaired. He exhibits a depressed mood. He expresses no homicidal and no suicidal ideation.    Review of Systems  Constitutional: Positive for malaise/fatigue.  HENT: Negative.   Eyes: Negative.  Respiratory: Negative.   Cardiovascular: Negative.   Gastrointestinal: Negative.   Musculoskeletal: Negative.   Skin: Negative.   Neurological: Negative.   Psychiatric/Behavioral: Positive for depression and memory loss. Negative for hallucinations, substance abuse and suicidal ideas. The patient is nervous/anxious. The patient does not have insomnia.     Blood pressure (!) 153/89, pulse 84, temperature 98.2 F (36.8 C), temperature source Oral, resp. rate 17, height 6' (1.829 m), weight 86.3 kg, SpO2 100 %.Body mass index is 25.8 kg/m.  General Appearance: Casual  Eye Contact:  Fair  Speech:  Slow  Volume:  Decreased  Mood:  Depressed  Affect:  Constricted and Depressed  Thought Process:  Coherent  Orientation:  Other:  Patient understands the basic situation.  He seems to be cognitively somewhat impaired or just apathetic.  Thought the month was March.  Thought Content:  Slow and fairly concrete  Suicidal Thoughts:  Yes.  without intent/plan  Homicidal Thoughts:  No  Memory:  Immediate;   Fair Recent;   Fair Remote;   Fair  Judgement:  Fair  Insight:  Fair  Psychomotor Activity:  Decreased  Concentration:  Concentration: Fair  Recall:  [employer] of Knowledge:  Fair  Language:  Fair  Akathisia:  No  Handed:  Right  AIMS (if indicated):     Assets:  Desire for Improvement Housing  ADL's:  Impaired  Cognition:  Impaired,  Mild  Sleep:  Number of Hours: 7     Treatment Plan Summary: Daily contact with patient to assess and evaluate symptoms and progress in treatment, Medication management and Plan After speaking with the patient it seems clear that he is extremely depressed.  Does not appear to be psychotic and denies acute  suicidal ideation and has been basically cooperative with treatment but is extremely withdrawn and blunted.  Talked with the patient about options for treatment.  For now we will continue the fluoxetine as started.  I have put in a dietary consult as he is requesting some alterations to his diet.  Encourage him to attend groups as much as possible.  Appreciate input from the diabetes coordinator about insulin doses.  Alethia Berthold, MD 03/30/2018, 4:23 PM

## 2018-03-30 NOTE — Progress Notes (Addendum)
Call placed several times to dining services for patient's supplemental drinks ordered to be given by MD orders. Dietary has not brought to unit yet or prepared for pick up. Will give once prepared or brought to unit.

## 2018-03-30 NOTE — Plan of Care (Signed)
Pt. Denies si/hi/avh, can contract for safety. Pt. Reports he can remain safe while on the unit.    Problem: Safety: Goal: Periods of time without injury will increase Outcome: Progressing   Problem: Self-Concept: Goal: Ability to disclose and discuss suicidal ideas will improve Outcome: Progressing   Problem: Safety: Goal: Ability to disclose and discuss suicidal ideas will improve Outcome: Progressing

## 2018-03-30 NOTE — Progress Notes (Signed)
Patient received by this RN at 07:20am this morning. Upon receiving this assignment this RN immediately continued the hypoglycemia protocol initiated during previous shift. This RN attempted to give the patient carbohydrates to follow hypoglycemia protocol, but patient states, "I'm brushing my fuckin' teeth I don't give a shit how low my blood sugar is!". Pt. Strongly encouraged to take carbohydrates being offered to increase blood sugar per protocol, but refusing at this time. Will continue to attempt to try to follow hypoglycemia protocol and educate the patient on the importance of raising his blood sugar to WDL as soon as possible.
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
## 2018-03-30 NOTE — Plan of Care (Signed)
Stayed in the milieu until bed time. Calm and cooperative. Denying thoughts of self harm

## 2018-03-30 NOTE — BHH Group Notes (Signed)
LCSW Group Therapy Note  03/30/2018 1:00 PM  Type of Therapy and Topic:  Group Therapy:  Feelings around Relapse and Recovery  Participation Level:  Did Not Attend   Description of Group:    Patients in this group will discuss emotions they experience before and after a relapse. They will process how experiencing these feelings, or avoidance of experiencing them, relates to having a relapse. Facilitator will guide patients to explore emotions they have related to recovery. Patients will be encouraged to process which emotions are more powerful. They will be guided to discuss the emotional reaction significant others in their lives may have to their relapse or recovery. Patients will be assisted in exploring ways to respond to the emotions of others without this contributing to a relapse.  Therapeutic Goals: 1. Patient will identify two or more emotions that lead to a relapse for them 2. Patient will identify two emotions that result when they relapse 3. Patient will identify two emotions related to recovery 4. Patient will demonstrate ability to communicate their needs through discussion and/or role plays   Summary of Patient Progress:     Therapeutic Modalities:   Cognitive Behavioral Therapy Solution-Focused Therapy Assertiveness Training Relapse Prevention Therapy   Assunta Curtis, MSW, LCSW 03/30/2018 2:37 PM

## 2018-03-30 NOTE — Progress Notes (Signed)
Recreation Therapy Notes    Date: 03/30/2018  Time: 9:30 am  Location: Craft [facility]  Behavioral response: Appropriate  Intervention Topic: Teamwork  Discussion/Intervention:  Group content on today was focused on teamwork. The group identified what teamwork is. Individuals described who is a part of their team. Patients expressed why they thought teamwork is important. The group stated reasons why they thought it was easier to work with a Dance movement psychotherapist team. Individuals discussed some positives and negatives of working with a team. Patients gave examples of past experiences they had while working with a team. The group participated in the intervention "What is That", where patients were given a chance to point out qualities they look for in a team mate and were able to work in teams with each other.  Clinical Observations/Feedback:  Patient came to group and identified his family as aprt of his team. He explained that he had patience when working in a team. Individual was social with peers and staff while participating in the intervention. Edward Colon LRT/CTRS      Edward Colon 03/30/2018 10:16 AM

## 2018-03-30 NOTE — Progress Notes (Signed)
D - Patient was in his room upon arrival to the unit. Patient was pleasant during assessment and medication administration. Patient presented sad and depressed, but was more talkative with this writer this evening. Patient denies HI, pain, anxiety today but endorses passive SI and states he is depressed. Patient verbally contracts for safety with this Probation officer.   A - Patient compliant with medication administration per MD orders. Patient given education. Patient given support and encouragement to be active with treatment plan. Patient informed to let staff know if there are any issues or problems on the unit.   R - Patient being monitored Q 15 minutes for safety per unit protocol. Patient remains safe on the unit at this time.

## 2018-03-30 NOTE — Progress Notes (Signed)
D: Pt denies SI/HI/AVH, can contract for safety. Pt is pleasant and cooperative, besides the incident this morning, but the patient was quick to understand the importance of the hypoglycemia protocols to be followed after some patient education was given. No behavioral concerns or incidents to report, besides during the morning with patient's low blood sugar. Pt. Endorses a mostly normal mood, but expresses some depression when asked, "5/10". Pt. Affect mostly flat, but appropriate. Pt.out in the milieu often.   A: Q x 15 minute observation checks were completed for safety. Patient was provided with education, but needs reinforcement. Patient was given/offered medications per orders. Patient  was encourage to attend groups, participate in unit activities and continue with plan of care. Pt. Chart and plans of care reviewed. Pt. Given support and encouragement.   R: Patient is complaint with medication and unit procedures, besides morning hypoglycemia incident. Pt. Attends all meals, eating good. Pt. Fluid intake monitored per MD orders. Pt. Unable to be weighed this shift, but will notify treatment team of this required need. Pt. Dialysis fistual WDL, no complications observed. Pt. Denies pain. Pt. Blood sugars monitored per MD and consult orders/recommendations. Pt. Up walking multiple times throughout the day.              Precautionary checks every 15 minutes for safety maintained, room free of safety hazards, patient sustains no injury or falls during this shift. Will endorse care to next shift.

## 2018-03-30 NOTE — Progress Notes (Signed)
Recreation Therapy Notes  INPATIENT RECREATION THERAPY ASSESSMENT  Patient Details Name: Edward Colon MRN: [id number] DOB: [birth year] Today's Date: 03/30/2018       Information Obtained From: Patient(Patient refused)  Able to Participate in Assessment/Interview:    Patient Presentation:    Reason for Admission (Per Patient):    Patient Stressors:    Coping Skills:      Leisure Interests (2+):     Frequency of Recreation/Participation:    Awareness of Community Resources:     [employer]:     Current Use:    If no, Barriers?:    Expressed Interest in [place] of Residence:     Patient Main Form of Transportation:    Patient Strengths:     Patient Identified Areas of Improvement:     Patient Goal for Hospitalization:     Current SI (including self-harm):     Current HI:     Current AVH:    Staff Intervention Plan:    Consent to Intern Participation:    Jaidan Prevette 03/30/2018, 2:47 PM

## 2018-03-30 NOTE — Progress Notes (Signed)
Hypoglycemic Event  CBG: 62 Upon this RN's arrival to the unit received in report  Treatment: 4 oz juice/soda  Symptoms: None  Follow-up CBG: Time:0736 CBG Result:109  Possible Reasons for Event: Other: Dialysis patient   Comments/MD notified: Dr. Weber Cooks notified at 07:43am    Patient received from report with low blood sugar and poor compliance to follow hypoglycemia protocol. Patient eventually complaint and protocol followed to WDL blood sugar levels.     Edward Colon

## 2018-03-30 NOTE — Plan of Care (Signed)
Patient is understanding of his medical problems and is compliant with procedures on the unit and medication administration per MD orders. Patient has a little bit better affect today compared to last night. Patient was more willing to speak with this writer but is still sad and depressed.   Problem: Education: Goal: Ability to make informed decisions regarding treatment will improve Outcome: Not Progressing   Problem: Education: Goal: Knowledge of the prescribed therapeutic regimen will improve Outcome: Progressing

## 2018-03-30 NOTE — Tx Team (Addendum)
Interdisciplinary Treatment and Diagnostic Plan Update  03/30/2018 Time of Session:  3:00pm Edward Colon MRN: [id number]  Principal Diagnosis: MDD (major depressive disorder), recurrent severe, without psychosis ([name])  Secondary Diagnoses: Principal Problem:   MDD (major depressive disorder), recurrent severe, without psychosis ([name]) Active Problems:   Seizures ([name])   Hypertension   Type 1 diabetes ([name])   Acute on chronic renal failure ([name])   Current Medications:  Current Facility-Administered Medications  Medication Dose Route Frequency Provider Last Rate Last Dose  . acetaminophen (TYLENOL) tablet 650 mg  650 mg Oral Q4H PRN [name], MD      . alum & mag hydroxide-simeth (MAALOX/MYLANTA) 200-200-20 MG/5ML suspension 30 mL  30 mL Oral Q4H PRN [name], MD      . calcium acetate (PHOSLO) capsule 2,001 mg  2,001 mg Oral TID WC [name], MD   2,001 mg at 03/30/18 1133  . Chlorhexidine Gluconate Cloth 2 % PADS 6 each  6 each Topical Q0600 [name], MD      . cinacalcet Edward Colon) tablet 90 mg  90 mg Oral Q Colon,Th,Sa-HD [name], MD   90 mg at 03/29/18 1733  . feeding supplement (NEPRO CARB STEADY) liquid 237 mL  237 mL Oral q morning - 10a [name], MD   237 mL at 03/30/18 1000  . feeding supplement (PRO-STAT SUGAR FREE 64) liquid 30 mL  30 mL Oral BID PC [name], MD      . FLUoxetine (PROZAC) capsule 20 mg  20 mg Oral Daily [name], MD   20 mg at 03/30/18 6144  . insulin aspart (novoLOG) injection 0-5 Units  0-5 Units Subcutaneous TID WC [name], [name], MD   4 Units at 03/30/18 1134  . insulin aspart (novoLOG) injection 4 Units  4 Units Subcutaneous TID WC [name], [name], MD   4 Units at 03/30/18 1138  . [START ON 03/31/2018] insulin glargine (LANTUS) injection 12 Units  12 Units Subcutaneous Daily [name], [name], MD      . insulin glargine (LANTUS) injection 8 Units  8 Units Subcutaneous QHS [name], MD    8 Units at 03/29/18 2132  . isosorbide-hydrALAZINE (BIDIL) 20-37.5 MG per tablet 1 tablet  1 tablet Oral TID [name], MD   1 tablet at 03/30/18 1133  . levETIRAcetam (KEPPRA) tablet 250 mg  250 mg Oral Q Colon,Th,Sat-1800 [name], MD   250 mg at 03/29/18 1734  . levETIRAcetam (KEPPRA) tablet 500 mg  500 mg Oral Q0600 [name], MD   500 mg at 03/30/18 0636  . minoxidil (LONITEN) tablet 2.5 mg  2.5 mg Oral q1800 Kolluru, Sarath, MD   2.5 mg at 03/29/18 1733  . multivitamin (RENA-VIT) tablet 1 tablet  1 tablet Oral QHS [name], MD   1 tablet at 03/29/18 2132  . NIFEdipine (PROCARDIA-XL/NIFEDICAL-XL) 24 hr tablet 30 mg  30 mg Oral BID [name], MD   30 mg at 03/30/18 3154  . protein supplement (ENSURE MAX) liquid  11 oz Oral Daily [name], MD   11 oz at 03/30/18 1359   PTA Medications: Medications Prior to Admission  Medication Sig Dispense Refill Last Dose  . calcium acetate (PHOSLO) 667 MG capsule Take 1 capsule (667 mg total) by mouth 3 (three) times daily with meals. (Patient taking differently: Take [phone] mg by mouth See admin instructions. Take 4 capsules (2668  mg) by mouth with meals and 2 capsules (1334 mg) with snacks) 90 capsule 0 03/11/2018 at Unknown time  . FLUoxetine (PROZAC) 20 MG capsule Take 1 capsule (20 mg total) by mouth daily.  3   . insulin aspart (NOVOLOG) 100 UNIT/ML injection Inject 0-9 Units into the skin 3 (three) times daily with meals for 30 days. For glucose 121 to 150 use one unit, for 151 to 200 use two units, for 201 to 250 use three units, for 251 to 300 use five units, for 301 to 350 use seven units, for 351 or greater use 9 units. 8.1 mL 0   . insulin glargine (LANTUS) 100 UNIT/ML injection Inject 0.14 mLs (14 Units total) into the skin every morning. 10 mL 11   . insulin glargine (LANTUS) 100 UNIT/ML injection Inject 0.08 mLs (8 Units total) into the skin at bedtime. 10 mL 11   . isosorbide-hydrALAZINE (BIDIL)  20-37.5 MG tablet Take 1 tablet by mouth 3 (three) times daily. 90 tablet 0   . levETIRAcetam (KEPPRA) 250 MG tablet Take 1 tablet (250 mg total) by mouth every Tuesday, Thursday, and Saturday at 6 PM for 30 days. 12 tablet 0   . levETIRAcetam (KEPPRA) 500 MG tablet Take 1 tablet (500 mg total) by mouth daily at 6 (six) AM for 30 days. 30 tablet 0   . minoxidil (LONITEN) 2.5 MG tablet Take 2.5 mg by mouth 2 (two) times daily.     . multivitamin (RENA-VIT) TABS tablet Take 1 tablet by mouth at bedtime. (Patient taking differently: Take 1 tablet by mouth daily. ) 30 tablet 0 02/08/2018 at Unknown time  . NIFEdipine (ADALAT CC) 30 MG 24 hr tablet Take 1 tablet (30 mg total) by mouth 2 (two) times daily. 60 tablet 0     Patient Stressors: Health problems Medication change or noncompliance  Patient Strengths: Ability for insight Motivation for treatment/growth  Treatment Modalities: Medication Management, Group therapy, Case management,  1 to 1 session with clinician, Psychoeducation, Recreational therapy.   Physician Treatment Plan for Primary Diagnosis: MDD (major depressive disorder), recurrent severe, without psychosis ([place]) Long Term Goal(s): Improvement in symptoms so as ready for discharge Improvement in symptoms so as ready for discharge   Short Term Goals: Ability to disclose and discuss suicidal ideas Ability to identify and develop effective coping behaviors will improve  Medication Management: Evaluate patient's response, side effects, and tolerance of medication regimen.  Therapeutic Interventions: 1 to 1 sessions, Unit Group sessions and Medication administration.  Evaluation of Outcomes: Not Progressing  Physician Treatment Plan for Secondary Diagnosis: Principal Problem:   MDD (major depressive disorder), recurrent severe, without psychosis ([place]) Active Problems:   Seizures ([place])   Hypertension   Type 1 diabetes ([facility])   Acute on chronic renal failure ([place])  Long Term  Goal(s): Improvement in symptoms so as ready for discharge Improvement in symptoms so as ready for discharge   Short Term Goals: Ability to disclose and discuss suicidal ideas Ability to identify and develop effective coping behaviors will improve     Medication Management: Evaluate patient's response, side effects, and tolerance of medication regimen.  Therapeutic Interventions: 1 to 1 sessions, Unit Group sessions and Medication administration.  Evaluation of Outcomes: Not Progressing   RN Treatment Plan for Primary Diagnosis: MDD (major depressive disorder), recurrent severe, without psychosis ([place]) Long Term Goal(s): Knowledge of disease and therapeutic regimen to maintain health will improve  Short Term Goals: Ability to demonstrate self-control, Ability to verbalize feelings  will improve, Ability to identify and develop effective coping behaviors will improve and Compliance with prescribed medications will improve  Medication Management: RN will administer medications as ordered by provider, will assess and evaluate patient's response and provide education to patient for prescribed medication. RN will report any adverse and/or side effects to prescribing provider.  Therapeutic Interventions: 1 on 1 counseling sessions, Psychoeducation, Medication administration, Evaluate responses to treatment, Monitor vital signs and CBGs as ordered, Perform/monitor CIWA, COWS, AIMS and Fall Risk screenings as ordered, Perform wound care treatments as ordered.  Evaluation of Outcomes: Not Progressing   LCSW Treatment Plan for Primary Diagnosis: MDD (major depressive disorder), recurrent severe, without psychosis ([place]) Long Term Goal(s): Safe transition to appropriate next level of care at discharge, Engage patient in therapeutic group addressing interpersonal concerns.  Short Term Goals: Engage patient in aftercare planning with referrals and resources, Increase social support, Increase ability to  appropriately verbalize feelings, Increase emotional regulation and Increase skills for wellness and recovery  Therapeutic Interventions: Assess for all discharge needs, 1 to 1 time with Social worker, Explore available resources and support systems, Assess for adequacy in community support network, Educate family and significant other(s) on suicide prevention, Complete Psychosocial Assessment, Interpersonal group therapy.  Evaluation of Outcomes: Not Progressing   Progress in Treatment: Attending groups: No. Participating in groups: No. Taking medication as prescribed: Yes. Toleration medication: Yes. Family/Significant other contact made: Yes, individual(s) contacted:  SPE completed with pt. He declined family contact. Patient understands diagnosis: Yes. Discussing patient identified problems/goals with staff: Yes. Medical problems stabilized or resolved: Yes. Denies suicidal/homicidal ideation: Yes. Issues/concerns per patient self-inventory: No. Other: none  New problem(s) identified: No, Describe:  none  New Short Term/Long Term Goal(s): medication management for mood stabilization; development of comprehensive mental wellness/sobriety plan.  Patient Goals:  "I really don'Colon know".  Discharge Plan or Barriers: Pt reports no barriers.  Patient has declined referrals at this time.  CSW will check again prior to the patient's discharge.   Reason for Continuation of Hospitalization: Depression Medical Issues Medication stabilization  Estimated Length of Stay:  Recreational Therapy: Patient Stressors: N/A  Patient Goal: Patient will engage in groups without prompting or encouragement from LRT x3 group sessions within 5 recreation therapy group sessions  Attendees: Patient: Edward Colon 03/30/2018 3:47 PM  Physician: Dr. Weber Cooks, MD 03/30/2018 3:47 PM  Nursing:  03/30/2018 3:47 PM  RN Care Manager: 03/30/2018 3:47 PM  Social Worker: Assunta Curtis, Blanca 03/30/2018 3:47  PM  Recreational Therapist: Devin Going, LRT 03/30/2018 3:47 PM  Other:  03/30/2018 3:47 PM  Other:  03/30/2018 3:47 PM  Other: 03/30/2018 3:47 PM    Scribe for Treatment Team: Rozann Lesches, LCSW 03/30/2018 3:47 PM

## 2018-03-30 NOTE — [facility] Suicide Risk Assessment (Signed)
[facility] INPATIENT:  Family/Significant Other Suicide Prevention Education  Suicide Prevention Education:  Patient Refusal for Family/Significant Other Suicide Prevention Education: The patient Edward Colon has refused to provide written consent for family/significant other to be provided Family/Significant Other Suicide Prevention Education during admission and/or prior to discharge.  Physician notified.\  .SPE completed with pt, as pt refused to consent to family contact. SPI pamphlet provided to pt and pt was encouraged to share information with support network, ask questions, and talk about any concerns relating to SPE. Pt denies access to guns/firearms and verbalized understanding of information provided. Mobile Crisis information also provided to pt.   Rozann Lesches 03/30/2018, 2:43 PM

## 2018-03-30 NOTE — Progress Notes (Signed)
Blood sugar this morning on the patient was 37. Patient given 15 oz of carbohydrates. Patient was not symptomatic. Blood sugar rechecked 15 minutes later and it was 62. Patient given 15 oz of carbohydrates. Patient is going to eat breakfast at 0730.

## 2018-03-30 NOTE — B[facility] Counselor (Signed)
CSW met with patient to complete his assessment. Patient declined to sit up during the assessment and CSW often had to have patient repeat his answers as they were mumbled or muffled by his cover.  CSW notes that patient appeared to be agitated.   CSW asked if patient would like referral to outside agency for outpatient therapy and/or medication management.  Patient declined referrals as well as declined to sign for SPE to be completed.   Assunta Curtis, MSW, LCSW 03/30/2018 2:39 PM

## 2018-03-31 LAB — RENAL FUNCTION PANEL
ALBUMIN: 3.6 g/dL (ref 3.5–5.0)
Anion gap: 14 (ref 5–15)
BUN: 79 mg/dL — ABNORMAL HIGH (ref 6–20)
CALCIUM: 8.6 mg/dL — AB (ref 8.9–10.3)
CO2: 25 mmol/L (ref 22–32)
Chloride: 94 mmol/L — ABNORMAL LOW (ref 98–111)
Creatinine, Ser: 12.81 mg/dL — ABNORMAL HIGH (ref 0.61–1.24)
GFR calc Af Amer: 5 mL/min — ABNORMAL LOW (ref >60)
GFR calc non Af Amer: 4 mL/min — ABNORMAL LOW (ref >60)
GLUCOSE: 170 mg/dL — AB (ref 70–99)
Phosphorus: 3.7 mg/dL (ref 2.5–4.6)
Potassium: 4.9 mmol/L (ref 3.5–5.1)
Sodium: 133 mmol/L — ABNORMAL LOW (ref 135–145)

## 2018-03-31 LAB — CBC
HCT: 29.8 % — ABNORMAL LOW (ref 39.0–52.0)
Hemoglobin: 10.1 g/dL — ABNORMAL LOW (ref 13.0–17.0)
MCH: 28.4 pg (ref 26.0–34.0)
MCHC: 33.9 g/dL (ref 30.0–36.0)
MCV: 83.7 fL (ref 80.0–100.0)
Platelets: 364 10*3/uL (ref 150–400)
RBC: 3.56 MIL/uL — ABNORMAL LOW (ref 4.22–5.81)
RDW: 12.4 % (ref 11.5–15.5)
WBC: 4 10*3/uL (ref 4.0–10.5)
nRBC: 0 % (ref 0.0–0.2)

## 2018-03-31 LAB — GLUCOSE, CAPILLARY
Glucose-Capillary: 301 mg/dL — ABNORMAL HIGH (ref 70–99)
Glucose-Capillary: 255 mg/dL — ABNORMAL HIGH (ref 70–99)
Glucose-Capillary: 135 mg/dL — ABNORMAL HIGH (ref 70–99)

## 2018-03-31 MED ORDER — INSULIN ASPART 100 UNIT/ML ~~LOC~~ SOLN
8.0000 [IU] | Freq: Three times a day (TID) | SUBCUTANEOUS | Status: DC
  Administered 2018-03-31 – 2018-04-01 (×3): 8 [IU] via SUBCUTANEOUS
  Filled 2018-03-31: qty 1

## 2018-03-31 MED ORDER — HEPARIN SODIUM (PORCINE) 1000 UNIT/ML DIALYSIS
50.0000 [IU]/kg | INTRAMUSCULAR | Status: DC | PRN
  Filled 2018-03-31: qty 5

## 2018-03-31 MED ORDER — INSULIN GLARGINE 100 UNIT/ML ~~LOC~~ SOLN
12.0000 [IU] | Freq: Two times a day (BID) | SUBCUTANEOUS | Status: DC
  Administered 2018-03-31 – 2018-04-03 (×6): 12 [IU] via SUBCUTANEOUS
  Filled 2018-03-31 (×9): qty 0.12

## 2018-03-31 NOTE — Plan of Care (Signed)
Pt. Monitored for safety. Pt. Denies si/hi/avh, can contract for safety.    Problem: Safety: Goal: Periods of time without injury will increase Outcome: Progressing   Problem: Self-Concept: Goal: Ability to disclose and discuss suicidal ideas will improve Outcome: Progressing   Problem: Safety: Goal: Ability to disclose and discuss suicidal ideas will improve Outcome: Progressing

## 2018-03-31 NOTE — Progress Notes (Signed)
D: Pt denies SI/HI/AVH, can contract for safety. Pt is pleasant and cooperative today, engages appropriate, but can be minimal at times. Pt. Endorses depression today and presents frequently with a flat affect. Pt. Went to dialysis today with no reported complications. Pt. Denies pain. Pt. Mostly isolative and withdrawn besides meals.   A: Q x 15 minute observation checks were completed for safety. Patient was provided with education. Patient was given/offered medications per orders. Patient  was encourage to attend groups, participate in unit activities and continue with plan of care. Pt. Chart and plans of care reviewed. Pt. Given support and encouragement.   R: Patient is complaint with medication and unit procedures. Pt. Attends all meals, eating good. Pt. Fluid intake monitored per MD orders. Pt. Weighed today. Pt. Dialysis fistual WDL, no complications observed. Pt. Blood sugars monitored per MD and consult orders/recommendations. Pt. Up walking multiple times throughout the day.             Precautionary checks every 15 minutes for safety maintained, room free of safety hazards, patient sustains no injury or falls during this shift. Will endorse care to next shift.

## 2018-03-31 NOTE — Progress Notes (Signed)
Patient back on the unit with escort present.

## 2018-03-31 NOTE — Progress Notes (Signed)
Flat and depressed but cooperative and compliant with treatment. Stayed in the milieu until bedtime. Currently out of bed and pleasant upon approach. Current weight: 203 lbs. Pt is encouraged to express thoughts and feelings. Safety precautions reinforced.

## 2018-03-31 NOTE — Consult Note (Signed)
[name] Consultation  Edward Colon QPY:[id number] DOB: 1980-08-28 DOA: 03/28/2018 PCP: Nolene Ebbs, MD   Requesting physician: Weber Cooks MD Date of consultation: 03/31/2018 Reason for consultation: Uncontrolled blood sugars  CHIEF COMPLAINT: Poorly controlled blood sugar  HISTORY OF PRESENT ILLNESS: Edward Colon  is a 38 y.o. male with a known history of diabetes mellitus, hypertension, seizure disorder, diabetic neuropathy, end-stage renal disease on dialysis currently in the behavioral unit for major depression.  Hospitalist service consulted for poorly controlled blood sugars.  Patient seen in the dialysis unit.  Has polyphagia.  Still makes some urine.  Currently is on sliding scale coverage with insulin along with Lantus insulin and mealtime insulin.  Blood sugars ranging around 300 mg/dL.  PAST MEDICAL HISTORY:   Past Medical History:  Diagnosis Date  . Anemia   . Blind left eye since ~ 2010  . Depression   . Diabetic peripheral neuropathy ([place])   . ESRD (end stage renal disease) on dialysis [facility])    "TTS; [facility]; [facility]" (02/01/2016)  . Heart murmur    denies any problems with it  . Hypertension   . Seizures ([place])    "last one was end of 2016; they are related to my diabetes" (02/01/2016)  . Type 1 diabetes ([place]) dx'd 1990    PAST SURGICAL HISTORY:  Past Surgical History:  Procedure Laterality Date  . AV FISTULA PLACEMENT Right 03/03/2015   Procedure: RIGHT RADIO-CEPHALIC ARTERIOVENOUS (AV) FISTULA CREATION;  Surgeon: Serafina Mitchell, MD;  [facility];  Service: Vascular;  Laterality: Right;  . AV FISTULA PLACEMENT Left 06/15/2015   Procedure: INSERTION OF LEFT UPPER ARM  ARTERIOVENOUS (AV) 52mm x 50cm GORE-TEX GRAFT;  Surgeon: Elam Dutch, MD;  Location: [place];  Service: Vascular;  Laterality: Left;  . BASCILIC VEIN TRANSPOSITION Left 04/01/2015   Procedure: BASCILIC VEIN TRANSPOSITION-LEFT ARM- FIRST STAGE;  Surgeon: Elam Dutch, MD;  Location: [place];  Service: Vascular;  Laterality: Left;  . EYE SURGERY Right ~ 2010   for diabetic retinopathy  . INSERTION OF DIALYSIS CATHETER Right 03/03/2015   Procedure: INSERTION OF DIALYSIS CATHETER;  Surgeon: Serafina Mitchell, MD;  Location: [place];  Service: Vascular;  Laterality: Right;    SOCIAL HISTORY:  Social History   Tobacco Use  . Smoking status: Never Smoker  . Smokeless tobacco: Never Used  Substance Use Topics  . Alcohol use: No    Frequency: Never    Comment: 02/01/2016 "might have 1 drink/year"    FAMILY HISTORY:  Family History  Problem Relation Age of Onset  . Diabetes Neg Hx     DRUG ALLERGIES: No Known Allergies  REVIEW OF SYSTEMS:   CONSTITUTIONAL: No fever, fatigue or weakness.  EYES: No blurred or double vision.  EARS, NOSE, AND THROAT: No tinnitus or ear pain.  RESPIRATORY: No cough, shortness of breath, wheezing or hemoptysis.  CARDIOVASCULAR: No chest pain, orthopnea, edema.  GASTROINTESTINAL: No nausea, vomiting, diarrhea or abdominal pain.  GENITOURINARY: No dysuria, hematuria.  ENDOCRINE: No polyuria, nocturia,  HEMATOLOGY: No anemia, easy bruising or bleeding SKIN: No rash or lesion. MUSCULOSKELETAL: No joint pain or arthritis.   NEUROLOGIC: No tingling, numbness, weakness.  PSYCHIATRY: No anxiety or depression.   MEDICATIONS AT HOME:  Prior to Admission medications   Medication Sig Start Date End Date Taking? Authorizing Provider  calcium acetate (PHOSLO) 667 MG capsule Take 1 capsule (667 mg total) by mouth 3 (three) times daily with meals. Patient taking differently:  Take [phone] mg by mouth See admin instructions. Take 4 capsules (2668 mg) by mouth with meals and 2 capsules (1334 mg) with snacks 03/07/15   [name], MD  FLUoxetine (PROZAC) 20 MG capsule Take 1 capsule (20 mg total) by mouth daily. 03/29/18   Oswald Hillock, MD  insulin aspart (NOVOLOG) 100 UNIT/ML injection Inject 0-9 Units into the skin 3  (three) times daily with meals for 30 days. For glucose 121 to 150 use one unit, for 151 to 200 use two units, for 201 to 250 use three units, for 251 to 300 use five units, for 301 to 350 use seven units, for 351 or greater use 9 units. 03/24/18 04/23/18  Arrien, Jimmy Picket, MD  insulin glargine (LANTUS) 100 UNIT/ML injection Inject 0.14 mLs (14 Units total) into the skin every morning. 03/28/18   Oswald Hillock, MD  insulin glargine (LANTUS) 100 UNIT/ML injection Inject 0.08 mLs (8 Units total) into the skin at bedtime. 03/29/18   Oswald Hillock, MD  isosorbide-hydrALAZINE (BIDIL) 20-37.5 MG tablet Take 1 tablet by mouth 3 (three) times daily. 03/13/18   Ward, Delice Bison, DO  levETIRAcetam (KEPPRA) 250 MG tablet Take 1 tablet (250 mg total) by mouth every Tuesday, Thursday, and Saturday at 6 PM for 30 days. 03/24/18 04/23/18  Arrien, Jimmy Picket, MD  levETIRAcetam (KEPPRA) 500 MG tablet Take 1 tablet (500 mg total) by mouth daily at 6 (six) AM for 30 days. 03/25/18 04/24/18  Arrien, Jimmy Picket, MD  minoxidil (LONITEN) 2.5 MG tablet Take 2.5 mg by mouth 2 (two) times daily.    [provider]  multivitamin (RENA-VIT) TABS tablet Take 1 tablet by mouth at bedtime. Patient taking differently: Take 1 tablet by mouth daily.  03/07/15   [name], MD  NIFEdipine (ADALAT CC) 30 MG 24 hr tablet Take 1 tablet (30 mg total) by mouth 2 (two) times daily. 03/13/18   Ward, Delice Bison, DO      PHYSICAL EXAMINATION:   VITAL SIGNS: Blood pressure 137/82, pulse 85, temperature 98 F (36.7 C), temperature source Oral, resp. rate 16, height 6' (1.829 m), weight 86.3 kg, SpO2 100 %.  GENERAL:  38 y.o.-year-old patient lying in the bed with no acute distress.  EYES: Pupils equal, round, reactive to light and accommodation. No scleral icterus. Extraocular muscles intact.  HEENT: Head atraumatic, normocephalic. Oropharynx and nasopharynx clear.  NECK:  Supple, no jugular venous distention. No thyroid  enlargement, no tenderness.  LUNGS: Normal breath sounds bilaterally, no wheezing, rales,rhonchi or crepitation. No use of accessory muscles of respiration.  CARDIOVASCULAR: S1, S2 normal. No murmurs, rubs, or gallops.  ABDOMEN: Soft, nontender, nondistended. Bowel sounds present. No organomegaly or mass.  EXTREMITIES: No pedal edema, cyanosis, or clubbing.  NEUROLOGIC: Cranial nerves II through XII are intact. Muscle strength 5/5 in all extremities. Sensation intact. Gait not checked.  PSYCHIATRIC: The patient is alert and oriented x 3.  SKIN: No obvious rash, lesion, or ulcer.   LABORATORY PANEL:   CBC Recent Labs  Lab 03/27/18 0740 03/29/18 0900 03/31/18 0930  WBC 4.7 4.2 4.0  HGB 10.8* 10.6* 10.1*  HCT 32.3* 32.3* 29.8*  PLT 309 329 364  MCV 86.4 86.4 83.7  MCH 28.9 28.3 28.4  MCHC 33.4 32.8 33.9  RDW 12.9 12.9 12.4   ------------------------------------------------------------------------------------------------------------------  Chemistries  Recent Labs  Lab 03/27/18 0434 03/28/18 0439 03/29/18 0900 03/31/18 0930  NA 137 135 133* 133*  K 4.3 5.0 5.2* 4.9  CL  96* 99 95* 94*  CO2 24 26 25 25   GLUCOSE 39* 217* 247* 170*  BUN 91* 48* 71* 79*  CREATININE 17.37* 11.09* 13.65* 12.81*  CALCIUM 9.1 7.9* 8.3* 8.6*   ------------------------------------------------------------------------------------------------------------------ estimated creatinine clearance is 8.8 mL/min (A) (by C-G formula based on SCr of 12.81 mg/dL (H)). ------------------------------------------------------------------------------------------------------------------ No results for input(s): TSH, T4TOTAL, T3FREE, THYROIDAB in the last 72 hours.  Invalid input(s): FREET3   Coagulation profile No results for input(s): INR, PROTIME in the last 168 hours. ------------------------------------------------------------------------------------------------------------------- No results for input(s):  DDIMER in the last 72 hours. -------------------------------------------------------------------------------------------------------------------  Cardiac Enzymes No results for input(s): CKMB, TROPONINI, MYOGLOBIN in the last 168 hours.  Invalid input(s): CK ------------------------------------------------------------------------------------------------------------------ Invalid input(s): POCBNP  ---------------------------------------------------------------------------------------------------------------  Urinalysis    Component Value Date/Time   COLORURINE YELLOW 03/14/2018 1135   APPEARANCEUR CLEAR 03/14/2018 1135   LABSPEC 1.014 03/14/2018 1135   PHURINE 8.0 03/14/2018 1135   GLUCOSEU >=500 (A) 03/14/2018 1135   HGBUR NEGATIVE 03/14/2018 1135   BILIRUBINUR NEGATIVE 03/14/2018 1135   KETONESUR NEGATIVE 03/14/2018 1135   PROTEINUR >=300 (A) 03/14/2018 1135   UROBILINOGEN 0.2 10/26/2013 2324   NITRITE NEGATIVE 03/14/2018 1135   LEUKOCYTESUR NEGATIVE 03/14/2018 1135     RADIOLOGY: No results found.  EKG: Orders placed or performed during the hospital encounter of 03/14/18  . EKG 12-Lead  . EKG 12-Lead  . ED EKG  . ED EKG  . EKG 12-Lead  . EKG 12-Lead  . EKG 12-Lead  . EKG 12-Lead  . EKG    IMPRESSION AND PLAN: 38 year old male patient with a known history of diabetes mellitus, hypertension, seizure disorder, diabetic neuropathy, end-stage renal disease on dialysis currently in the behavioral unit for major depression.  -Uncontrolled diabetes mellitus Increase Lantus insulin to 12 units twice daily Increase mealtime insulin to 8 units 3 times daily Continue sliding scale coverage with insulin Monitor blood sugars closely Diabetic diet  -End-stage renal disease Appreciate nephrology follow-up Continue dialysis  -Major depression Continue psych management  -Seizure disorder Continue oral Keppra  All the records are reviewed and case discussed with ED  provider. Management plans discussed with the patient, family and they are in agreement.  CODE STATUS:Full code    Code Status Orders  (From admission, onward)         Start     Ordered   03/28/18 2138  Full code  Continuous     03/28/18 2137        Code Status History    Date Active Date Inactive Code Status Order ID Comments User Context   03/28/2018 2137 03/28/2018 2137 Full Code [id number]  [name], MD Inpatient   03/14/2018 1115 03/28/2018 2134 Full Code [id number]  Lorella Nimrod, MD ED   02/08/2018 1505 02/15/2018 2030 Full Code [id number]  Marijean Heath, NP ED   03/05/2017 0952 03/06/2017 0833 Full Code [id number]  Etheleen Nicks, MD ED   01/28/2016 2318 02/03/2016 2248 Full Code [id number]  Norval Morton, MD ED   02/27/2015 0655 03/07/2015 1850 Full Code [id number]  Edwin Dada, MD ED   08/30/2014 2133 09/02/2014 1449 Full Code [id number]  Theressa Millard, MD Inpatient   05/31/2013 1413 06/04/2013 1834 Full Code [id number]  Thurnell Lose, MD Inpatient       TOTAL TIME TAKING CARE OF THIS PATIENT: 52 minutes.    Saundra Shelling M.D on 03/31/2018 at 1:13 PM  Between 7am to 6pm - Pager - [phone]  After 6pm go  to [email or web address] - password [email or web address]  [facility] Office  [phone]  CC: Primary care physician; Nolene Ebbs, MD

## 2018-03-31 NOTE — Progress Notes (Signed)
[facility] MD Progress Note  03/31/2018 11:21 AM Edward Colon  MRN:  [id number] Subjective: Follow-up for this patient with a history of depression and multiple medical problems.  Patient says he is feeling bad today.  When I came to see him he was once again in bed and once again did not even have the energy to look up and speak to me.  Passive suicidal thoughts without any intent or plan.  Chronic complaints about feeling exhausted and hopeless about his medical condition.  Blood sugars are still out of control with high levels today. Principal Problem: MDD (major depressive disorder), recurrent severe, without psychosis ([place]) Diagnosis: Principal Problem:   MDD (major depressive disorder), recurrent severe, without psychosis ([place]) Active Problems:   Seizures ([place])   Hypertension   Type 1 diabetes ([place])   Acute on chronic renal failure ([place])  Total Time spent with patient: 30 minutes  Past Psychiatric History: Patient has a history of depression but with poor compliance with outpatient treatment.  Past history of suicidality.  Past Medical History:  Past Medical History:  Diagnosis Date  . Anemia   . Blind left eye since ~ 2010  . Depression   . Diabetic peripheral neuropathy ([place])   . ESRD (end stage renal disease) on dialysis [facility])    "TTS; [facility]; [facility]" (02/01/2016)  . Heart murmur    denies any problems with it  . Hypertension   . Seizures ([place])    "last one was end of 2016; they are related to my diabetes" (02/01/2016)  . Type 1 diabetes ([place]) dx'd 1990    Past Surgical History:  Procedure Laterality Date  . AV FISTULA PLACEMENT Right 03/03/2015   Procedure: RIGHT RADIO-CEPHALIC ARTERIOVENOUS (AV) FISTULA CREATION;  Surgeon: Serafina Mitchell, MD;  [facility] OR;  Service: Vascular;  Laterality: Right;  . AV FISTULA PLACEMENT Left 06/15/2015   Procedure: INSERTION OF LEFT UPPER ARM  ARTERIOVENOUS (AV) 24mm x 50cm GORE-TEX GRAFT;  Surgeon: Elam Dutch, MD;   Location: [place];  Service: Vascular;  Laterality: Left;  . BASCILIC VEIN TRANSPOSITION Left 04/01/2015   Procedure: BASCILIC VEIN TRANSPOSITION-LEFT ARM- FIRST STAGE;  Surgeon: Elam Dutch, MD;  Location: [place];  Service: Vascular;  Laterality: Left;  . EYE SURGERY Right ~ 2010   for diabetic retinopathy  . INSERTION OF DIALYSIS CATHETER Right 03/03/2015   Procedure: INSERTION OF DIALYSIS CATHETER;  Surgeon: Serafina Mitchell, MD;  Location: [facility] OR;  Service: Vascular;  Laterality: Right;   Family History:  Family History  Problem Relation Age of Onset  . Diabetes Neg Hx    Family Psychiatric  History: See previous note Social History:  Social History   Substance and Sexual Activity  Alcohol Use No  . Frequency: Never   Comment: 02/01/2016 "might have 1 drink/year"     Social History   Substance and Sexual Activity  Drug Use No    Social History   Socioeconomic History  . Marital status: Single    Spouse name: Not on file  . Number of children: Not on file  . Years of education: Not on file  . Highest education level: Not on file  Occupational History  . Not on file  Social Needs  . Financial resource strain: Not on file  . Food insecurity:    Worry: Not on file    Inability: Not on file  . Transportation needs:    Medical: Not on file    Non-medical: Not  on file  Tobacco Use  . Smoking status: Never Smoker  . Smokeless tobacco: Never Used  Substance and Sexual Activity  . Alcohol use: No    Frequency: Never    Comment: 02/01/2016 "might have 1 drink/year"  . Drug use: No  . Sexual activity: Yes  Lifestyle  . Physical activity:    Days per week: Not on file    Minutes per session: Not on file  . Stress: Not on file  Relationships  . Social connections:    Talks on phone: Not on file    Gets together: Not on file    Attends religious service: Not on file    Active member of club or organization: Not on file    Attends meetings of clubs or  organizations: Not on file    Relationship status: Not on file  Other Topics Concern  . Not on file  Social History Narrative  . Not on file   Additional Social History:                         Sleep: Fair  Appetite:  Fair  Current Medications: Current Facility-Administered Medications  Medication Dose Route Frequency Provider Last Rate Last Dose  . acetaminophen (TYLENOL) tablet 650 mg  650 mg Oral Q4H PRN [name], MD      . alum & mag hydroxide-simeth (MAALOX/MYLANTA) 200-200-20 MG/5ML suspension 30 mL  30 mL Oral Q4H PRN [name], MD      . calcium acetate (PHOSLO) capsule 2,001 mg  2,001 mg Oral TID WC [name], MD   2,001 mg at 03/31/18 0751  . Chlorhexidine Gluconate Cloth 2 % PADS 6 each  6 each Topical Q0600 [name], MD   6 each at 03/31/18 0900  . cinacalcet (SENSIPAR) tablet 90 mg  90 mg Oral Q T,Th,Sa-HD [name], MD   90 mg at 03/29/18 1733  . feeding supplement (NEPRO CARB STEADY) liquid 237 mL  237 mL Oral q morning - 10a [name], MD   237 mL at 03/30/18 1000  . feeding supplement (PRO-STAT SUGAR FREE 64) liquid 30 mL  30 mL Oral BID PC [name], MD   30 mL at 03/30/18 1730  . FLUoxetine (PROZAC) capsule 20 mg  20 mg Oral Daily [name], MD   20 mg at 03/31/18 0750  . heparin injection 4,300 Units  50 Units/kg Dialysis PRN Kolluru, Sarath, MD      . insulin aspart (novoLOG) injection 0-5 Units  0-5 Units Subcutaneous TID WC , [name], MD   4 Units at 03/31/18 0751  . insulin aspart (novoLOG) injection 8 Units  8 Units Subcutaneous TID WC Pyreddy, Pavan, MD      . insulin glargine (LANTUS) injection 12 Units  12 Units Subcutaneous BID Pyreddy, Pavan, MD      . isosorbide-hydrALAZINE (BIDIL) 20-37.5 MG per tablet 1 tablet  1 tablet Oral TID [name], MD   1 tablet at 03/31/18 0751  . levETIRAcetam (KEPPRA) tablet 250 mg  250 mg Oral Q T,Th,Sat-1800 [name], MD   250 mg at  03/29/18 1734  . levETIRAcetam (KEPPRA) tablet 500 mg  500 mg Oral Q0600 [name], MD   500 mg at 03/31/18 0750  . minoxidil (LONITEN) tablet 2.5 mg  2.5 mg Oral q1800 Kolluru, Sarath, MD   2.5 mg at 03/30/18 1730  . multivitamin (  RENA-VIT) tablet 1 tablet  1 tablet Oral QHS [name], MD   1 tablet at 03/30/18 2111  . NIFEdipine (PROCARDIA-XL/NIFEDICAL-XL) 24 hr tablet 30 mg  30 mg Oral BID [name], MD   30 mg at 03/31/18 0751  . protein supplement (ENSURE MAX) liquid  11 oz Oral Daily [name], MD   11 oz at 03/30/18 1359    Lab Results:  Results for orders placed or performed during the hospital encounter of 03/28/18 (from the past 48 hour(s))  Glucose, capillary     Status: Abnormal   Collection Time: 03/29/18  3:07 PM  Result Value Ref Range   Glucose-Capillary 247 (H) 70 - 99 mg/dL  Glucose, capillary     Status: Abnormal   Collection Time: 03/29/18  5:26 PM  Result Value Ref Range   Glucose-Capillary 177 (H) 70 - 99 mg/dL  Glucose, capillary     Status: Abnormal   Collection Time: 03/29/18  8:44 PM  Result Value Ref Range   Glucose-Capillary 54 (L) 70 - 99 mg/dL   Comment 1 Notify RN   Glucose, capillary     Status: Abnormal   Collection Time: 03/29/18  9:38 PM  Result Value Ref Range   Glucose-Capillary 146 (H) 70 - 99 mg/dL   Comment 1 Notify RN   Glucose, capillary     Status: Abnormal   Collection Time: 03/30/18  6:37 AM  Result Value Ref Range   Glucose-Capillary 37 (LL) 70 - 99 mg/dL   Comment 1 Notify RN   Glucose, capillary     Status: Abnormal   Collection Time: 03/30/18  6:57 AM  Result Value Ref Range   Glucose-Capillary 62 (L) 70 - 99 mg/dL   Comment 1 Notify RN   Glucose, capillary     Status: Abnormal   Collection Time: 03/30/18  7:36 AM  Result Value Ref Range   Glucose-Capillary 109 (H) 70 - 99 mg/dL  Glucose, capillary     Status: Abnormal   Collection Time: 03/30/18 11:28 AM  Result Value Ref Range    Glucose-Capillary 334 (H) 70 - 99 mg/dL  Glucose, capillary     Status: Abnormal   Collection Time: 03/30/18  4:11 PM  Result Value Ref Range   Glucose-Capillary 144 (H) 70 - 99 mg/dL   Comment 1 Notify RN    Comment 2 Document in Chart   Glucose, capillary     Status: Abnormal   Collection Time: 03/30/18  8:30 PM  Result Value Ref Range   Glucose-Capillary 280 (H) 70 - 99 mg/dL   Comment 1 Notify RN   Glucose, capillary     Status: Abnormal   Collection Time: 03/31/18  7:11 AM  Result Value Ref Range   Glucose-Capillary 301 (H) 70 - 99 mg/dL   Comment 1 Notify RN   Renal function panel     Status: Abnormal   Collection Time: 03/31/18  9:30 AM  Result Value Ref Range   Sodium 133 (L) 135 - 145 mmol/L   Potassium 4.9 3.5 - 5.1 mmol/L   Chloride 94 (L) 98 - 111 mmol/L   CO2 25 22 - 32 mmol/L   Glucose, Bld 170 (H) 70 - 99 mg/dL   BUN 79 (H) 6 - 20 mg/dL   Creatinine, Ser 12.81 (H) 0.61 - 1.24 mg/dL   Calcium 8.6 (L) 8.9 - 10.3 mg/dL   Phosphorus 3.7 2.5 - 4.6 mg/dL   Albumin 3.6 3.5 - 5.0 g/dL  GFR calc non Af Amer 4 (L) >60 mL/min   GFR calc Af Amer 5 (L) >60 mL/min   Anion gap 14 5 - 15    Comment: Performed at [facility], [address] [place], [place] [address]  CBC     Status: Abnormal   Collection Time: 03/31/18  9:30 AM  Result Value Ref Range   WBC 4.0 4.0 - 10.5 K/uL   RBC 3.56 (L) 4.22 - 5.81 MIL/uL   Hemoglobin 10.1 (L) 13.0 - 17.0 g/dL   HCT 29.8 (L) 39.0 - 52.0 %   MCV 83.7 80.0 - 100.0 fL   MCH 28.4 26.0 - 34.0 pg   MCHC 33.9 30.0 - 36.0 g/dL   RDW 12.4 11.5 - 15.5 %   Platelets 364 150 - 400 K/uL   nRBC 0.0 0.0 - 0.2 %    Comment: Performed at [facility], [address] [place], [place] [address]    Blood Alcohol level:  Lab Results  Component Value Date   [facility] <10 03/12/2018   ETH <10 19/50/9326    Metabolic Disorder Labs: Lab Results  Component Value Date   HGBA1C 9.1 (H) 02/13/2018   MPG 214.47 02/13/2018   MPG  249 08/31/2014   No results found for: PROLACTIN Lab Results  Component Value Date   TRIG 221 (H) 02/10/2018    Physical Findings: AIMS:  , ,  ,  ,    CIWA:    COWS:     Musculoskeletal: Strength & Muscle Tone: within normal limits Gait & Station: normal Patient leans: N/A  Psychiatric Specialty Exam: Physical Exam  Nursing note and vitals reviewed. Constitutional: He appears well-developed and well-nourished.  HENT:  Head: Normocephalic and atraumatic.  Eyes: Pupils are equal, round, and reactive to light. Conjunctivae are normal.  Neck: Normal range of motion.  Cardiovascular: Regular rhythm and normal heart sounds.  Respiratory: Effort normal. No respiratory distress.  GI: Soft.  Musculoskeletal: Normal range of motion.  Neurological: He is alert.  Skin: Skin is warm and dry.  Psychiatric: His affect is blunt. His speech is delayed. He is slowed and withdrawn. Cognition and memory are impaired. He expresses impulsivity. He exhibits a depressed mood. He expresses suicidal ideation. He expresses no suicidal plans.    Review of Systems  Constitutional: Positive for malaise/fatigue.  HENT: Negative.   Eyes: Negative.   Respiratory: Negative.   Cardiovascular: Negative.   Gastrointestinal: Negative.   Musculoskeletal: Negative.   Skin: Negative.   Neurological: Negative.   Psychiatric/Behavioral: Positive for depression and suicidal ideas. Negative for hallucinations, memory loss and substance abuse. The patient has insomnia. The patient is not nervous/anxious.     Blood pressure (!) 151/86, pulse 82, temperature 98 F (36.7 C), temperature source Oral, resp. rate 16, height 6' (1.829 m), weight 86.3 kg, SpO2 100 %.Body mass index is 25.8 kg/m.  General Appearance: Disheveled  Eye Contact:  Minimal  Speech:  Slow  Volume:  Decreased  Mood:  Depressed  Affect:  Depressed  Thought Process:  Coherent  Orientation:  Full (Time, Place, and Person)  Thought Content:   Rumination and Tangential  Suicidal Thoughts:  Yes.  without intent/plan  Homicidal Thoughts:  No  Memory:  Immediate;   Fair Recent;   Fair Remote;   Fair  Judgement:  Impaired  Insight:  Shallow  Psychomotor Activity:  Decreased  Concentration:  Concentration: Poor  Recall:  Poor  Fund of Knowledge:  Fair  Language:  Fair  Akathisia:  No  Handed:  Right  AIMS (if indicated):     Assets:  Desire for Improvement Housing Resilience  ADL's:  Intact  Cognition:  Impaired,  Mild  Sleep:  Number of Hours: 6.45     Treatment Plan Summary: Daily contact with patient to assess and evaluate symptoms and progress in treatment, Medication management and Plan Patient continues to appear to be extremely depressed.  Although he gets up out of bed briefly he is mostly still staying to himself with little communication.  Much of this also could be related to his medical condition with ongoing out-of-control blood sugar.  I have put in a consult and spoken to the hospitalist to see if they could be of any help with his blood sugar.  Patient does have dialysis today.  No complaints of any side effects from medicine.  Patient as I mentioned still seems very depressed and withdrawn.  Could even be an ECT candidate but I did not bring that up with him today.  No change to psychiatric medicine.  Supportive encouragement to get out of bed.  Alethia Berthold, MD 03/31/2018, 11:21 AM

## 2018-03-31 NOTE — Plan of Care (Signed)
Calm and cooperative. Currently attending group.

## 2018-03-31 NOTE — Progress Notes (Signed)
[facility]  ROUNDING NOTE   Subjective:   Seen and examined on hemodialysis treatment. Tolerating treatment well. UF of 4 liters.     HEMODIALYSIS FLOWSHEET:  Blood Flow Rate (mL/min): 300 mL/min Arterial Pressure (mmHg): -180 mmHg Venous Pressure (mmHg): 140 mmHg Transmembrane Pressure (mmHg): 90 mmHg Ultrafiltration Rate (mL/min): 1.15 mL/min Dialysate Flow Rate (mL/min): 800 ml/min Conductivity: Machine : 14 Conductivity: Machine : 14 Dialysis Fluid Bolus: Normal Saline Bolus Amount (mL): 250 mL Dialysate Change: 2K    Objective:  Vital signs in last 24 hours:  Temp:  [98 F (36.7 C)-98.3 F (36.8 C)] 98 F (36.7 C) (01/25 0924) Pulse Rate:  [80-93] 86 (01/25 1130) Resp:  [16-17] 16 (01/25 1130) BP: (139-162)/(74-90) 155/83 (01/25 1130) SpO2:  [100 %] 100 % (01/25 1130)  Weight change:  Filed Weights   03/29/18 0500 03/29/18 0900 03/29/18 1228  Weight: 87.3 kg 90 kg 86.3 kg    Intake/Output: I/O last 3 completed shifts: In: 1287 [P.O.:1437] Out: -    Intake/Output this shift:  Total I/O In: 240 [P.O.:240] Out: -   Physical Exam: General: NAD,   Head: Normocephalic, atraumatic. Moist oral mucosal membranes  Eyes: Anicteric, PERRL  Neck: Supple, trachea midline  Lungs:  Clear to auscultation  Heart: Regular rate and rhythm  Abdomen:  Soft, nontender,   Extremities: No peripheral edema.  Neurologic: Nonfocal, moving all four extremities  Skin: No lesions  Access: Left AVF    Basic Metabolic Panel: Recent Labs  Lab 03/27/18 0434 03/28/18 0439 03/29/18 0900 03/31/18 0930  NA 137 135 133* 133*  K 4.3 5.0 5.2* 4.9  CL 96* 99 95* 94*  CO2 24 26 25 25   GLUCOSE 39* 217* 247* 170*  BUN 91* 48* 71* 79*  CREATININE 17.37* 11.09* 13.65* 12.81*  CALCIUM 9.1 7.9* 8.3* 8.6*  PHOS  --   --  4.5 3.7    Liver Function Tests: Recent Labs  Lab 03/29/18 0900 03/31/18 0930  ALBUMIN 3.5 3.6   No results for input(s): LIPASE, AMYLASE in  the last 168 hours. No results for input(s): AMMONIA in the last 168 hours.  CBC: Recent Labs  Lab 03/27/18 0740 03/29/18 0900 03/31/18 0930  WBC 4.7 4.2 4.0  HGB 10.8* 10.6* 10.1*  HCT 32.3* 32.3* 29.8*  MCV 86.4 86.4 83.7  PLT 309 329 364    Cardiac Enzymes: No results for input(s): CKTOTAL, CKMB, CKMBINDEX, TROPONINI in the last 168 hours.  BNP: Invalid input(s): POCBNP  CBG: Recent Labs  Lab 03/30/18 0736 03/30/18 1128 03/30/18 1611 03/30/18 2030 03/31/18 0711  GLUCAP 109* 334* 144* 280* 301*    Microbiology: Results for orders placed or performed during the hospital encounter of 03/14/18  Blood culture (routine x 2)     Status: Abnormal   Collection Time: 03/14/18  8:27 AM  Result Value Ref Range Status   Specimen Description BLOOD SITE NOT SPECIFIED  Final   Special Requests   Final    BOTTLES DRAWN AEROBIC AND ANAEROBIC Blood Culture adequate volume   Culture  Setup Time   Final    GRAM POSITIVE COCCI IN BOTH AEROBIC AND ANAEROBIC BOTTLES CRITICAL RESULT CALLED TO, READ BACK BY AND VERIFIED WITH: Denton Brick PHARMD [id number] 03/15/18 A BROWNING    Culture (A)  Final    STAPHYLOCOCCUS SPECIES (COAGULASE NEGATIVE) THE SIGNIFICANCE OF ISOLATING THIS ORGANISM FROM A SINGLE SET OF BLOOD CULTURES WHEN MULTIPLE SETS ARE DRAWN IS UNCERTAIN. PLEASE NOTIFY THE MICROBIOLOGY DEPARTMENT WITHIN ONE  WEEK IF SPECIATION AND SENSITIVITIES ARE REQUIRED. Performed at [facility], [place] [address] [place], [place] [address]    Report Status 03/17/2018 FINAL  Final  Blood Culture ID Panel (Reflexed)     Status: Abnormal   Collection Time: 03/14/18  8:27 AM  Result Value Ref Range Status   Enterococcus species NOT DETECTED NOT DETECTED Final   Listeria monocytogenes NOT DETECTED NOT DETECTED Final   Staphylococcus species DETECTED (A) NOT DETECTED Final    Comment: Methicillin (oxacillin) susceptible coagulase negative staphylococcus. Possible blood culture contaminant (unless  isolated from more than one blood culture draw or clinical case suggests pathogenicity). No antibiotic treatment is indicated for blood  culture contaminants. CRITICAL RESULT CALLED TO, READ BACK BY AND VERIFIED WITH: Denton Brick PHARMD [id number] 03/15/18 A BROWNING    Staphylococcus aureus (BCID) NOT DETECTED NOT DETECTED Final   Methicillin resistance NOT DETECTED NOT DETECTED Final   Streptococcus species NOT DETECTED NOT DETECTED Final   Streptococcus agalactiae NOT DETECTED NOT DETECTED Final   Streptococcus pneumoniae NOT DETECTED NOT DETECTED Final   Streptococcus pyogenes NOT DETECTED NOT DETECTED Final   Acinetobacter baumannii NOT DETECTED NOT DETECTED Final   Enterobacteriaceae species NOT DETECTED NOT DETECTED Final   Enterobacter cloacae complex NOT DETECTED NOT DETECTED Final   Escherichia coli NOT DETECTED NOT DETECTED Final   Klebsiella oxytoca NOT DETECTED NOT DETECTED Final   Klebsiella pneumoniae NOT DETECTED NOT DETECTED Final   Proteus species NOT DETECTED NOT DETECTED Final   Serratia marcescens NOT DETECTED NOT DETECTED Final   Haemophilus influenzae NOT DETECTED NOT DETECTED Final   Neisseria meningitidis NOT DETECTED NOT DETECTED Final   Pseudomonas aeruginosa NOT DETECTED NOT DETECTED Final   Candida albicans NOT DETECTED NOT DETECTED Final   Candida glabrata NOT DETECTED NOT DETECTED Final   Candida krusei NOT DETECTED NOT DETECTED Final   Candida parapsilosis NOT DETECTED NOT DETECTED Final   Candida tropicalis NOT DETECTED NOT DETECTED Final    Comment: Performed at [facility], [place]. [address] [place], [place] [address]  MRSA PCR Screening     Status: None   Collection Time: 03/14/18  2:39 PM  Result Value Ref Range Status   MRSA by PCR NEGATIVE NEGATIVE Final    Comment:        The GeneXpert MRSA Assay (FDA approved for NASAL specimens only), is one component of a comprehensive MRSA colonization surveillance program. It is not intended to diagnose  MRSA infection nor to guide or monitor treatment for MRSA infections. Performed at [facility], [place] [address], [place], [place] [address]   Blood culture (routine x 2)     Status: None   Collection Time: 03/14/18  2:55 PM  Result Value Ref Range Status   Specimen Description BLOOD RIGHT ANTECUBITAL  Final   Special Requests AEROBIC BOTTLE ONLY Blood Culture adequate volume  Final   Culture   Final    NO GROWTH 5 DAYS Performed at [facility], [place] [address], [place], [place] [address]    Report Status 03/19/2018 FINAL  Final  Culture, respiratory (non-expectorated)     Status: None   Collection Time: 03/15/18  7:38 AM  Result Value Ref Range Status   Specimen Description TRACHEAL ASPIRATE  Final   Special Requests NONE  Final   Gram Stain   Final    ABUNDANT WBC PRESENT, PREDOMINANTLY PMN ABUNDANT GRAM POSITIVE COCCI Performed at [facility], [place]  [address], [place], [place] [address]    Culture MODERATE Consistent with normal respiratory flora.  Final   Report Status 03/17/2018 FINAL  Final    Coagulation Studies: No results for input(s): LABPROT, INR in the last 72 hours.  Urinalysis: No results for input(s): COLORURINE, LABSPEC, PHURINE, GLUCOSEU, HGBUR, BILIRUBINUR, KETONESUR, PROTEINUR, UROBILINOGEN, NITRITE, LEUKOCYTESUR in the last 72 hours.  Invalid input(s): APPERANCEUR    Imaging: No results found.   Medications:    . calcium acetate  2,001 mg Oral TID WC  . Chlorhexidine Gluconate Cloth  6 each Topical Q0600  . cinacalcet  90 mg Oral Q T,Th,Sa-HD  . feeding supplement (NEPRO CARB STEADY)  237 mL Oral q morning - 10a  . feeding supplement (PRO-STAT SUGAR FREE 64)  30 mL Oral BID PC  . FLUoxetine  20 mg Oral Daily  . insulin aspart  0-5 Units Subcutaneous TID WC  . insulin aspart  8 Units Subcutaneous TID WC  . insulin glargine  12 Units Subcutaneous BID  . isosorbide-hydrALAZINE  1 tablet Oral TID  . levETIRAcetam  250 mg Oral Q  T,Th,Sat-1800  . levETIRAcetam  500 mg Oral Q0600  . minoxidil  2.5 mg Oral q1800  . multivitamin  1 tablet Oral QHS  . NIFEdipine  30 mg Oral BID  . ENSURE MAX PROTEIN  11 oz Oral Daily   acetaminophen, alum & mag hydroxide-simeth, heparin  Assessment/ Plan:  Mr. Edward Colon is a 38 y.o. black male with end stage renal disease on hemodialysis, depression, diabetes mellitus type I insulin dependent, and hypertension, who was transferred to [facility] behavioral health on 03/28/2018   TTS [facility]) [place] Left AVF  1. End Stage Renal Disease: seen and examined on hemodialysis. Tolerating treatment well.  UF goal of 4 liters  2. Hypertension: elevated. Home regimen of nifedipine, minoxidil, and isosorbide mononitrate.   3. Anemia of chronic kidney disease: hemoglobin 10.1 No indication for ESA  4. Secondary Hyperparathyroidism: phosphorus at goal, 4.5 - Continue calcium acetate with meals.  - cinacalcet on dialysis days.    LOS: 3 Ahley Bulls 1/25/202011:45 AM

## 2018-03-31 NOTE — BHH Group Notes (Signed)
LCSW Group Therapy Note  03/31/2018 1:15pm  Type of Therapy and Topic:  Group Therapy:  Cognitive Distortions  Participation Level:  Did Not Attend   Description of Group:    Patients in this group will be introduced to the topic of cognitive distortions.  Patients will identify and describe cognitive distortions, describe the feelings these distortions create for them.  Patients will identify one or more situations in their personal life where they have cognitively distorted thinking and will verbalize challenging this cognitive distortion through positive thinking skills.  Patients will practice the skill of using positive affirmations to challenge cognitive distortions using affirmation cards.    Therapeutic Goals:  1. Patient will identify two or more cognitive distortions they have used 2. Patient will identify one or more emotions that stem from use of a cognitive distortion 3. Patient will demonstrate use of a positive affirmation to counter a cognitive distortion through discussion and/or role play. 4. Patient will describe one way cognitive distortions can be detrimental to wellness   Summary of Patient Progress:Pt was invited to attend group but chose not to attend. CSW will continue to encourage pt to attend group throughout their admission.      Therapeutic Modalities:   Cognitive Behavioral Therapy Motivational Interviewing   Cheree Ditto, LCSW 03/31/2018 12:33 PM

## 2018-03-31 NOTE — Progress Notes (Signed)
Patient escorted off the unit at this time with staff to dialysis.

## 2018-04-01 LAB — GLUCOSE, CAPILLARY
Glucose-Capillary: 113 mg/dL — ABNORMAL HIGH (ref 70–99)
Glucose-Capillary: 83 mg/dL (ref 70–99)
Glucose-Capillary: 34 mg/dL — CL (ref 70–99)
Glucose-Capillary: 46 mg/dL — ABNORMAL LOW (ref 70–99)
Glucose-Capillary: 96 mg/dL (ref 70–99)
Glucose-Capillary: 224 mg/dL — ABNORMAL HIGH (ref 70–99)

## 2018-04-01 MED ORDER — BUPROPION HCL ER (XL) 150 MG PO TB24
150.0000 mg | ORAL_TABLET | Freq: Every day | ORAL | Status: DC
  Administered 2018-04-01: 150 mg via ORAL
  Filled 2018-04-01 (×2): qty 1

## 2018-04-01 NOTE — Progress Notes (Signed)
[facility] NAME: Edward Colon    MR#:  [id number]  DATE OF BIRTH:  26-Oct-1980  SUBJECTIVE:  Patient seen and evaluated today Has good appetite Blood sugars have been better controlled after adjustment of insulin dose No dizziness No blurry vision  REVIEW OF SYSTEMS:    ROS  CONSTITUTIONAL: No documented fever. No fatigue, weakness. No weight gain, no weight loss.  EYES: No blurry or double vision.  ENT: No tinnitus. No postnasal drip. No redness of the oropharynx.  RESPIRATORY: No cough, no wheeze, no hemoptysis. No dyspnea.  CARDIOVASCULAR: No chest pain. No orthopnea. No palpitations. No syncope.  GASTROINTESTINAL: No nausea, no vomiting or diarrhea. No abdominal pain. No melena or hematochezia.  GENITOURINARY: No dysuria or hematuria.  ENDOCRINE: No polyuria or nocturia. No heat or cold intolerance.  HEMATOLOGY: No anemia. No bruising. No bleeding.  INTEGUMENTARY: No rashes. No lesions.  MUSCULOSKELETAL: No arthritis. No swelling. No gout.  NEUROLOGIC: No numbness, tingling, or ataxia. No seizure-type activity.  PSYCHIATRIC: No anxiety. No insomnia. No ADD.   DRUG ALLERGIES:  No Known Allergies  VITALS:  Blood pressure 128/80, pulse 100, temperature 98.6 F (37 C), temperature source Oral, resp. rate 17, height 6' (1.829 m), weight 87.2 kg, SpO2 98 %.  PHYSICAL EXAMINATION:   Physical Exam  GENERAL:  38 y.o.-year-old patient lying in the bed with no acute distress.  EYES: Pupils equal, round, reactive to light and accommodation. No scleral icterus. Extraocular muscles intact.  HEENT: Head atraumatic, normocephalic. Oropharynx and nasopharynx clear.  NECK:  Supple, no jugular venous distention. No thyroid enlargement, no tenderness.  LUNGS: Normal breath sounds bilaterally, no wheezing, rales, rhonchi. No use of accessory muscles of respiration.  CARDIOVASCULAR: S1, S2 normal. No murmurs, rubs, or gallops.   ABDOMEN: Soft, nontender, nondistended. Bowel sounds present. No organomegaly or mass.  EXTREMITIES: No cyanosis, clubbing or edema b/l.    NEUROLOGIC: Cranial nerves II through XII are intact. No focal Motor or sensory deficits b/l.   PSYCHIATRIC: The patient is alert and oriented x 3.  SKIN: No obvious rash, lesion, or ulcer.   LABORATORY PANEL:   CBC Recent Labs  Lab 03/31/18 0930  WBC 4.0  HGB 10.1*  HCT 29.8*  PLT 364   ------------------------------------------------------------------------------------------------------------------ Chemistries  Recent Labs  Lab 03/31/18 0930  NA 133*  K 4.9  CL 94*  CO2 25  GLUCOSE 170*  BUN 79*  CREATININE 12.81*  CALCIUM 8.6*   ------------------------------------------------------------------------------------------------------------------  Cardiac Enzymes No results for input(s): TROPONINI in the last 168 hours. ------------------------------------------------------------------------------------------------------------------  RADIOLOGY:  No results found.   ASSESSMENT AND PLAN:   38 year old male patient with a known history of diabetes mellitus, hypertension, seizure disorder, diabetic neuropathy, end-stage renal disease on dialysis currently in the behavioral unit for major depression.  -Uncontrolled diabetes mellitus Blood sugars are better controlled today Continue Lantus insulin to 12 units twice daily Continue mealtime insulin to 8 units 3 times daily Continue sliding scale coverage with insulin Monitor blood sugars closely Diabetic diet to continue  -End-stage renal disease Appreciate nephrology follow-up Continue dialysis as per the schedule  -Major depression Continue psych management  -Seizure disorder Continue oral Keppra  -We will sign off and follow-up as needed  All the records are reviewed and case discussed with Care Management/Social Worker. Management plans discussed with the patient,  family and they are in agreement.  CODE STATUS: Full code  DVT Prophylaxis: SCDs  TOTAL TIME TAKING  CARE OF THIS PATIENT: 35 minutes.   POSSIBLE D/C IN 2 to 3 DAYS, DEPENDING ON CLINICAL CONDITION.  Saundra Shelling M.D on 04/01/2018 at 11:17 AM  Between 7am to 6pm - Pager - [phone]  After 6pm go to [email or web address] - password [email or web address] [facility]  Office  [phone]  CC: Primary care physician; Nolene Ebbs, MD  Note: This dictation was prepared with Dragon dictation along with smaller phrase technology. Any transcriptional errors that result from this process are unintentional.

## 2018-04-01 NOTE — BHH Group Notes (Signed)
LCSW Group Therapy Note 04/01/2018 1:15pm  Type of Therapy and Topic: Group Therapy: Feelings Around Returning Home & Establishing a Supportive Framework and Supporting Oneself When Supports Not Available  Participation Level: Did Not Attend  Description of Group:  Patients first processed thoughts and feelings about upcoming discharge. These included fears of upcoming changes, lack of change, new living environments, judgements and expectations from others and overall stigma of mental health issues. The group then discussed the definition of a supportive framework, what that looks and feels like, and how do to discern it from an unhealthy non-supportive network. The group identified different types of supports as well as what to do when your family/friends are less than helpful or unavailable  Therapeutic Goals  1. Patient will identify one healthy supportive network that they can use at discharge. 2. Patient will identify one factor of a supportive framework and how to tell it from an unhealthy network. 3. Patient able to identify one coping skill to use when they do not have positive supports from others. 4. Patient will demonstrate ability to communicate their needs through discussion and/or role plays.  Summary of Patient Progress:  Pt was invited to attend group but chose not to attend. CSW will continue to encourage pt to attend group throughout their admission.   Therapeutic Modalities Cognitive Behavioral Therapy Motivational Interviewing   Ludie Hudon  CUEBAS-COLON, LCSW 04/01/2018 10:03 AM

## 2018-04-01 NOTE — Progress Notes (Signed)
[facility] MD Progress Note  04/01/2018 10:24 AM Edward Colon  MRN:  [id number] Subjective: Follow-up for patient with depression as well as multiple serious medical problems.  Once again the patient stays in bed almost constantly and when I went in to see him was not willing to talk much.  Despite being awake he hardly lifts his head.  Says that he does not feel like talking.  Affect flat and withdrawn.  Patient does still eat probably more than he needs to and has been cooperative with medical treatment.  Does not report active suicidal ideation.  Does not go to group very much although he has been to some groups.  Not reporting active suicidal thoughts but he seems very passive and withdrawn. Principal Problem: MDD (major depressive disorder), recurrent severe, without psychosis ([place]) Diagnosis: Principal Problem:   MDD (major depressive disorder), recurrent severe, without psychosis ([place]) Active Problems:   Seizures ([place])   Hypertension   Type 1 diabetes ([place])   Acute on chronic renal failure ([place])  Total Time spent with patient: 20 minutes  Past Psychiatric History: Patient has a history of suicide attempts recurrent depression poor compliance  Past Medical History:  Past Medical History:  Diagnosis Date  . Anemia   . Blind left eye since ~ 2010  . Depression   . Diabetic peripheral neuropathy ([place])   . ESRD (end stage renal disease) on dialysis [facility])    "TTS; [place] [facility]; [facility]" (02/01/2016)  . Heart murmur    denies any problems with it  . Hypertension   . Seizures ([place])    "last one was end of 2016; they are related to my diabetes" (02/01/2016)  . Type 1 diabetes ([place]) dx'd 1990    Past Surgical History:  Procedure Laterality Date  . AV FISTULA PLACEMENT Right 03/03/2015   Procedure: RIGHT RADIO-CEPHALIC ARTERIOVENOUS (AV) FISTULA CREATION;  Surgeon: Serafina Mitchell, MD;  Location: [facility] OR;  Service: Vascular;  Laterality: Right;  . AV FISTULA PLACEMENT Left 06/15/2015    Procedure: INSERTION OF LEFT UPPER ARM  ARTERIOVENOUS (AV) 48mm x 50cm GORE-TEX GRAFT;  Surgeon: Elam Dutch, MD;  Location: [place];  Service: Vascular;  Laterality: Left;  . BASCILIC VEIN TRANSPOSITION Left 04/01/2015   Procedure: BASCILIC VEIN TRANSPOSITION-LEFT ARM- FIRST STAGE;  Surgeon: Elam Dutch, MD;  Location: [place];  Service: Vascular;  Laterality: Left;  . EYE SURGERY Right ~ 2010   for diabetic retinopathy  . INSERTION OF DIALYSIS CATHETER Right 03/03/2015   Procedure: INSERTION OF DIALYSIS CATHETER;  Surgeon: Serafina Mitchell, MD;  Location: [facility] OR;  Service: Vascular;  Laterality: Right;   Family History:  Family History  Problem Relation Age of Onset  . Diabetes Neg Hx    Family Psychiatric  History: None known Social History:  Social History   Substance and Sexual Activity  Alcohol Use No  . Frequency: Never   Comment: 02/01/2016 "might have 1 drink/year"     Social History   Substance and Sexual Activity  Drug Use No    Social History   Socioeconomic History  . Marital status: Single    Spouse name: Not on file  . Number of children: Not on file  . Years of education: Not on file  . Highest education level: Not on file  Occupational History  . Not on file  Social Needs  . Financial resource strain: Not on file  . Food insecurity:    Worry: Not on file  Inability: Not on file  . Transportation needs:    Medical: Not on file    Non-medical: Not on file  Tobacco Use  . Smoking status: Never Smoker  . Smokeless tobacco: Never Used  Substance and Sexual Activity  . Alcohol use: No    Frequency: Never    Comment: 02/01/2016 "might have 1 drink/year"  . Drug use: No  . Sexual activity: Yes  Lifestyle  . Physical activity:    Days per week: Not on file    Minutes per session: Not on file  . Stress: Not on file  Relationships  . Social connections:    Talks on phone: Not on file    Gets together: Not on file    Attends religious service:  Not on file    Active member of club or organization: Not on file    Attends meetings of clubs or organizations: Not on file    Relationship status: Not on file  Other Topics Concern  . Not on file  Social History Narrative  . Not on file   Additional Social History:                         Sleep: Fair  Appetite:  Fair  Current Medications: Current Facility-Administered Medications  Medication Dose Route Frequency Provider Last Rate Last Dose  . acetaminophen (TYLENOL) tablet 650 mg  650 mg Oral Q4H PRN [name], MD      . alum & mag hydroxide-simeth (MAALOX/MYLANTA) 200-200-20 MG/5ML suspension 30 mL  30 mL Oral Q4H PRN [name], MD      . buPROPion (WELLBUTRIN XL) 24 hr tablet 150 mg  150 mg Oral Daily ,  T, MD      . calcium acetate (PHOSLO) capsule 2,001 mg  2,001 mg Oral TID WC [name], MD   2,001 mg at 04/01/18 1610  . Chlorhexidine Gluconate Cloth 2 % PADS 6 each  6 each Topical Q0600 [name], MD   6 each at 03/31/18 0900  . cinacalcet (SENSIPAR) tablet 90 mg  90 mg Oral Q T,Th,Sa-HD [name], MD   90 mg at 03/29/18 1733  . feeding supplement (NEPRO CARB STEADY) liquid 237 mL  237 mL Oral q morning - 10a [name], MD   237 mL at 04/01/18 0951  . feeding supplement (PRO-STAT SUGAR FREE 64) liquid 30 mL  30 mL Oral BID PC [name], MD   30 mL at 04/01/18 0951  . FLUoxetine (PROZAC) capsule 20 mg  20 mg Oral Daily [name], MD   20 mg at 04/01/18 0834  . heparin injection 4,300 Units  50 Units/kg Dialysis PRN Kolluru, Sarath, MD      . insulin aspart (novoLOG) injection 0-5 Units  0-5 Units Subcutaneous TID WC , [name], MD   3 Units at 03/31/18 1625  . insulin aspart (novoLOG) injection 8 Units  8 Units Subcutaneous TID WC Saundra Shelling, MD   8 Units at 04/01/18 0834  . insulin glargine (LANTUS) injection 12 Units  12 Units Subcutaneous BID Saundra Shelling, MD   12 Units at 04/01/18 0834   . isosorbide-hydrALAZINE (BIDIL) 20-37.5 MG per tablet 1 tablet  1 tablet Oral TID [name], MD   1 tablet at 04/01/18 [phone]  . levETIRAcetam (KEPPRA) tablet 250 mg  250 mg Oral Q T,Th,Sat-1800 [name], MD   250 mg at 03/31/18  1732  . levETIRAcetam (KEPPRA) tablet 500 mg  500 mg Oral Q0600 [name], MD   500 mg at 04/01/18 6433  . minoxidil (LONITEN) tablet 2.5 mg  2.5 mg Oral q1800 Kolluru, Sarath, MD   2.5 mg at 03/31/18 1732  . multivitamin (RENA-VIT) tablet 1 tablet  1 tablet Oral QHS [name], MD   1 tablet at 03/31/18 2129  . NIFEdipine (PROCARDIA-XL/NIFEDICAL-XL) 24 hr tablet 30 mg  30 mg Oral BID [name], MD   30 mg at 04/01/18 0834  . protein supplement (ENSURE MAX) liquid  11 oz Oral Daily [name], MD   11 oz at 03/31/18 1344    Lab Results:  Results for orders placed or performed during the hospital encounter of 03/28/18 (from the past 48 hour(s))  Glucose, capillary     Status: Abnormal   Collection Time: 03/30/18 11:28 AM  Result Value Ref Range   Glucose-Capillary 334 (H) 70 - 99 mg/dL  Glucose, capillary     Status: Abnormal   Collection Time: 03/30/18  4:11 PM  Result Value Ref Range   Glucose-Capillary 144 (H) 70 - 99 mg/dL   Comment 1 Notify RN    Comment 2 Document in Chart   Glucose, capillary     Status: Abnormal   Collection Time: 03/30/18  8:30 PM  Result Value Ref Range   Glucose-Capillary 280 (H) 70 - 99 mg/dL   Comment 1 Notify RN   Glucose, capillary     Status: Abnormal   Collection Time: 03/31/18  7:11 AM  Result Value Ref Range   Glucose-Capillary 301 (H) 70 - 99 mg/dL   Comment 1 Notify RN   Renal function panel     Status: Abnormal   Collection Time: 03/31/18  9:30 AM  Result Value Ref Range   Sodium 133 (L) 135 - 145 mmol/L   Potassium 4.9 3.5 - 5.1 mmol/L   Chloride 94 (L) 98 - 111 mmol/L   CO2 25 22 - 32 mmol/L   Glucose, Bld 170 (H) 70 - 99 mg/dL   BUN 79 (H) 6 - 20 mg/dL   Creatinine, Ser  12.81 (H) 0.61 - 1.24 mg/dL   Calcium 8.6 (L) 8.9 - 10.3 mg/dL   Phosphorus 3.7 2.5 - 4.6 mg/dL   Albumin 3.6 3.5 - 5.0 g/dL   GFR calc non Af Amer 4 (L) >60 mL/min   GFR calc Af Amer 5 (L) >60 mL/min   Anion gap 14 5 - 15    Comment: Performed at [facility], [address] [place], [place] [address]  CBC     Status: Abnormal   Collection Time: 03/31/18  9:30 AM  Result Value Ref Range   WBC 4.0 4.0 - 10.5 K/uL   RBC 3.56 (L) 4.22 - 5.81 MIL/uL   Hemoglobin 10.1 (L) 13.0 - 17.0 g/dL   HCT 29.8 (L) 39.0 - 52.0 %   MCV 83.7 80.0 - 100.0 fL   MCH 28.4 26.0 - 34.0 pg   MCHC 33.9 30.0 - 36.0 g/dL   RDW 12.4 11.5 - 15.5 %   Platelets 364 150 - 400 K/uL   nRBC 0.0 0.0 - 0.2 %    Comment: Performed at [facility], [address] [place], [place] [address]  Glucose, capillary     Status: Abnormal   Collection Time: 03/31/18  4:17 PM  Result Value Ref Range   Glucose-Capillary 255 (H) 70 - 99 mg/dL  Glucose, capillary     Status: Abnormal   Collection Time: 03/31/18  8:28 PM  Result Value Ref Range   Glucose-Capillary 135 (H) 70 - 99 mg/dL   Comment 1 Notify RN   Glucose, capillary     Status: Abnormal   Collection Time: 04/01/18  7:19 AM  Result Value Ref Range   Glucose-Capillary 113 (H) 70 - 99 mg/dL   Comment 1 Notify RN     Blood Alcohol level:  Lab Results  Component Value Date   ETH <10 03/12/2018   ETH <10 58/11/9831    Metabolic Disorder Labs: Lab Results  Component Value Date   HGBA1C 9.1 (H) 02/13/2018   MPG 214.47 02/13/2018   MPG 249 08/31/2014   No results found for: PROLACTIN Lab Results  Component Value Date   TRIG 221 (H) 02/10/2018    Physical Findings: AIMS:  , ,  ,  ,    CIWA:    COWS:     Musculoskeletal: Strength & Muscle Tone: within normal limits Gait & Station: normal Patient leans: N/A  Psychiatric Specialty Exam: Physical Exam  Nursing note and vitals reviewed. Constitutional: He appears well-developed  and well-nourished.  HENT:  Head: Normocephalic and atraumatic.  Eyes: Pupils are equal, round, and reactive to light. Conjunctivae are normal.  Neck: Normal range of motion.  Cardiovascular: Regular rhythm and normal heart sounds.  Respiratory: Effort normal. No respiratory distress.  GI: Soft.  Musculoskeletal: Normal range of motion.  Neurological: He is alert.  Skin: Skin is warm and dry.  Psychiatric: His affect is blunt. His speech is delayed. He is slowed and withdrawn. Thought content is not paranoid. Cognition and memory are impaired. He expresses inappropriate judgment. He expresses no suicidal ideation.    Review of Systems  Constitutional: Positive for malaise/fatigue.  HENT: Negative.   Eyes: Negative.   Respiratory: Negative.   Cardiovascular: Negative.   Gastrointestinal: Negative.   Musculoskeletal: Negative.   Skin: Negative.   Neurological: Negative.   Psychiatric/Behavioral: Positive for depression. Negative for hallucinations, memory loss, substance abuse and suicidal ideas. The patient does not have insomnia.     Blood pressure 128/80, pulse 100, temperature 98.6 F (37 C), temperature source Oral, resp. rate 17, height 6' (1.829 m), weight 87.2 kg, SpO2 98 %.Body mass index is 26.07 kg/m.  General Appearance: Casual  Eye Contact:  Minimal  Speech:  Slow  Volume:  Decreased  Mood:  Depressed  Affect:  Congruent and Depressed  Thought Process:  Coherent  Orientation:  Full (Time, Place, and Person)  Thought Content:  Negative  Suicidal Thoughts:  No  Homicidal Thoughts:  No  Memory:  Immediate;   Fair Recent;   Poor Remote;   Poor  Judgement:  Impaired  Insight:  Shallow  Psychomotor Activity:  Decreased  Concentration:  Concentration: Poor  Recall:  Poor  Fund of Knowledge:  Fair  Language:  Fair  Akathisia:  No  Handed:  Right  AIMS (if indicated):     Assets:  Desire for Improvement Housing  ADL's:  Intact  Cognition:  Impaired,  Mild   Sleep:  Number of Hours: 7.15     Treatment Plan Summary: Daily contact with patient to assess and evaluate symptoms and progress in treatment, Medication management and Plan Patient remains difficult to assess.  Stays very withdrawn and hardly speaks to treatment team.  If this is a reflection of profound depression I am starting to think that I should be pushing ECT  for him.  Today he was not willing to engage enough to have a conversation.  I am adding a low-dose of Wellbutrin.  Risks should be low given that he remains on his antiseizure medicine.  Very much appreciate assistance from medicine and nephrology.  His blood sugars are a little better controlled over the last day.  Patient still too withdrawn flat and uncooperative to even consider discharge planning.  Alethia Berthold, MD 04/01/2018, 10:24 AM

## 2018-04-01 NOTE — Progress Notes (Signed)
Patient attended group but was quiet, not willing to discuss. Had a snack then received bed time medication. Patient went to bed and fell asleep. Woke up in the middle of the night and started walking around, pacing. Went back to his room and slammed the door. Told this Probation officer that he wanted to be left alone. Patient went back to sleep. Currently awake. No additional concerns.

## 2018-04-01 NOTE — Progress Notes (Signed)
Hypoglycemic Event  CBG: 34 @ 1659  Treatment: 2 tubes glucose gel, Catheryn Bacon, Dinner Tray  Symptoms: None  Follow-up CBG: Time:1721 CBG Result:46 Follow-up CBG: Time:1736 CBG Result:96   Possible Reasons for Event: Unknown  Comments/MD notified:Dr. Emelda Brothers @ [place]

## 2018-04-01 NOTE — Plan of Care (Signed)
Data: Patient is appropriate and cooperative to assessment. Patient denies SI/HI/AVH. Patient has completed daily self inventory worksheet. Patient had no complaints. Patient reports good sleep quality, appetite is good for the last 24 hours. Patient rates depression "0/10" , feelings of hopelessness "0/10" and anxiety "5/10" Patients goal for today is left blank. Patient was knowledgeable of scheduled mediations.   Action:  Q x 15 minute observation checks were completed for safety. Patient was provided with education on medications. Patient was offered support and encouragement. Patient was given scheduled medications. Patient  was encourage to attend groups, participate in unit activities and continue with plan of care.    Response: Patient adheres to scheduled medications. Patient has no complaints at this time. Patient is receptive to treatment and safety maintained on unit.    Problem: Safety: Goal: Periods of time without injury will increase Outcome: Progressing   Problem: Education: Goal: Ability to make informed decisions regarding treatment will improve Outcome: Progressing   Problem: Self-Concept: Goal: Ability to disclose and discuss suicidal ideas will improve Outcome: Progressing   Problem: Education: Goal: Knowledge of the prescribed therapeutic regimen will improve Outcome: Progressing

## 2018-04-01 NOTE — Plan of Care (Signed)
Sad and depressed but compliant with treatment. Stayed in room, did not want to say much.

## 2018-04-01 NOTE — Progress Notes (Signed)
Patient ID: Edward Colon, male   DOB: [birth year], 38 y.o.   MRN: [id number] Per State regulations 482.30 this chart was reviewed for medical necessity with respect to the patient's admission/duration of stay.    Next review date: 04/05/2018  Debarah Crape, BSN, RN-BC  Case Manager

## 2018-04-01 NOTE — Progress Notes (Signed)
NUTRITION ASSESSMENT  RD received consult for assessment of nutrition status/requirements.  INTERVENTION: Will discontinue order for Ensure Max, Nepro, and Pro-Stat per patient request.  Provide double protein portions at meals as patient has increased protein requirements on HD.  Provide snacks BID between meals. Snacks will be brought to unit at 1000 and 1400.  Continue Rena-vite QHS.  NUTRITION DIAGNOSIS: Increased nutrient needs related to catabolic illness (ESRD on HD) as evidenced by estimated needs.  Goal: Pt to meet >/= 90% of their estimated nutrition needs.  Monitor:  PO intake  Assessment:  38 y.o. male with PMHx of DM type 1 (dx 1990), HTN, hx seizures, ESRD on HD, diabetic peripheral neuropathy admitted with MDD.  Met with patient in his room. He had just finished eating lunch and was up walking around room. Patient was recently admitted at [facility] where he was followed by RDs following extubation for a decreased appetite/intake. Patient reports his appetite is better now and he is very hungry. He feels he is not getting enough to eat here. Patient has very high protein needs with HD, so will order double protein portions at meals. He also requests snacks between meals. He does not like the oral nutrition supplements and wants these discontinued. He has been on HD for approximately 3 years. No questions about diet. His phosphorus and potassium are WNL. Takes phosphate binders at meals. CBGs coming down today. Patient reports he is weight-stable.  Meal Completion: 100%  Medications reviewed and include: Wellbutrin, Phoslo 2001 mg TID with meals, Nepro once daily, Pro-stat 30 mL BID, Novolog 0-5 units TID, Novolog 8 units TID, Lantus 12 units BID, Keppra, Rena-vite QHS, Ensure Max daily.  Labs reviewed: CBG 83-255. On 1/25 Potassium and Phosphorus WNL. Last HgbA1c 9.1 on 02/13/2018.  Height: Ht Readings from Last 1 Encounters:  03/28/18 6' (1.829 m)    Weight: Wt  Readings from Last 1 Encounters:  03/31/18 87.2 kg    Weight Hx: Wt Readings from Last 10 Encounters:  03/31/18 87.2 kg  03/27/18 87.3 kg  02/15/18 90.4 kg  05/08/17 90.7 kg  03/08/17 89.9 kg  01/15/17 90.7 kg  12/14/16 91.2 kg  11/17/16 88.5 kg  04/29/16 95.3 kg  04/11/16 95.3 kg   BMI:  Body mass index is 26.07 kg/m. Pt meets criteria for overweight based on current BMI.  Estimated Nutritional Needs: Kcal: 30-35 kcal/kg Protein: 1.2-1.5 grams protein/kg Fluid: UOP + 1 L  Diet Order:  Diet Order            Diet Carb Modified Fluid consistency: Thin; Room service appropriate? Yes  Diet effective now             Pt is also offered choice of unit snacks mid-morning and mid-afternoon.  Willey Blade, MS, [place], LDN Office: [phone] Pager: [phone] After Hours/Weekend Pager: [phone]

## 2018-04-02 LAB — CBC
HCT: 30.3 % — ABNORMAL LOW (ref 39.0–52.0)
Hemoglobin: 10.2 g/dL — ABNORMAL LOW (ref 13.0–17.0)
MCH: 28.7 pg (ref 26.0–34.0)
MCHC: 33.7 g/dL (ref 30.0–36.0)
MCV: 85.1 fL (ref 80.0–100.0)
Platelets: 380 10*3/uL (ref 150–400)
RBC: 3.56 MIL/uL — ABNORMAL LOW (ref 4.22–5.81)
RDW: 12.7 % (ref 11.5–15.5)
WBC: 4.5 10*3/uL (ref 4.0–10.5)
nRBC: 0 % (ref 0.0–0.2)

## 2018-04-02 LAB — GLUCOSE, CAPILLARY
Glucose-Capillary: 167 mg/dL — ABNORMAL HIGH (ref 70–99)
Glucose-Capillary: 167 mg/dL — ABNORMAL HIGH (ref 70–99)
Glucose-Capillary: 105 mg/dL — ABNORMAL HIGH (ref 70–99)
Glucose-Capillary: 139 mg/dL — ABNORMAL HIGH (ref 70–99)

## 2018-04-02 LAB — RENAL FUNCTION PANEL
Albumin: 3.6 g/dL (ref 3.5–5.0)
Anion gap: 15 (ref 5–15)
BUN: 89 mg/dL — ABNORMAL HIGH (ref 6–20)
CO2: 25 mmol/L (ref 22–32)
Calcium: 9.1 mg/dL (ref 8.9–10.3)
Chloride: 94 mmol/L — ABNORMAL LOW (ref 98–111)
Creatinine, Ser: 13.81 mg/dL — ABNORMAL HIGH (ref 0.61–1.24)
GFR calc Af Amer: 5 mL/min — ABNORMAL LOW (ref >60)
GFR calc non Af Amer: 4 mL/min — ABNORMAL LOW (ref >60)
Glucose, Bld: 186 mg/dL — ABNORMAL HIGH (ref 70–99)
Phosphorus: 4.6 mg/dL (ref 2.5–4.6)
Potassium: 6 mmol/L — ABNORMAL HIGH (ref 3.5–5.1)
Sodium: 134 mmol/L — ABNORMAL LOW (ref 135–145)

## 2018-04-02 MED ORDER — RISPERIDONE 1 MG PO TBDP
0.5000 mg | ORAL_TABLET | Freq: Two times a day (BID) | ORAL | Status: DC
  Administered 2018-04-02 – 2018-04-05 (×6): 0.5 mg via ORAL
  Filled 2018-04-02 (×7): qty 0.5

## 2018-04-02 MED ORDER — HEPARIN SODIUM (PORCINE) 1000 UNIT/ML DIALYSIS: 1000.0000 [IU] | INTRAMUSCULAR | Status: DC | PRN

## 2018-04-02 MED ORDER — HEPARIN SODIUM (PORCINE) 1000 UNIT/ML DIALYSIS: 20.0000 [IU]/kg | INTRAMUSCULAR | Status: DC | PRN

## 2018-04-02 MED ORDER — INSULIN ASPART 100 UNIT/ML ~~LOC~~ SOLN
6.0000 [IU] | Freq: Three times a day (TID) | SUBCUTANEOUS | Status: DC
  Administered 2018-04-03 – 2018-04-10 (×17): 6 [IU] via SUBCUTANEOUS
  Filled 2018-04-02 (×7): qty 1

## 2018-04-02 MED ORDER — PENTAFLUOROPROP-TETRAFLUOROETH EX AERO
1.0000 "application " | INHALATION_SPRAY | CUTANEOUS | Status: DC | PRN
  Filled 2018-04-02: qty 30

## 2018-04-02 MED ORDER — LIDOCAINE-PRILOCAINE 2.5-2.5 % EX CREA
1.0000 "application " | TOPICAL_CREAM | CUTANEOUS | Status: DC | PRN
  Filled 2018-04-02: qty 5

## 2018-04-02 MED ORDER — SODIUM CHLORIDE 0.9 % IV SOLN: 100.0000 mL | INTRAVENOUS | Status: DC | PRN

## 2018-04-02 MED ORDER — ALTEPLASE 2 MG IJ SOLR: 2.0000 mg | Freq: Once | INTRAMUSCULAR | Status: DC | PRN

## 2018-04-02 MED ORDER — LIDOCAINE HCL (PF) 1 % IJ SOLN: 5.0000 mL | INTRAMUSCULAR | Status: DC | PRN

## 2018-04-02 NOTE — Progress Notes (Signed)
Post HD Assessment    04/02/18 1235  Neurological  Orientation Level Oriented X4  Respiratory  Respiratory Pattern Regular;Unlabored  Cough None  Cardiac  Pulse Regular  Heart Sounds S1, S2  ECG Monitor Yes  Cardiac Rhythm NSR  Vascular  R Radial Pulse +2  L Radial Pulse +2  Edema Generalized  Generalized Edema None  RUE Edema None  Psychosocial  Psychosocial (WDL) WDL

## 2018-04-02 NOTE — Progress Notes (Signed)
Pre HD TX, pt having extra tx for elevated calcium, MD reduced TX time to 3hr. Pt is at [facility], reg TX days TTS    04/02/18 0926  Vital Signs  Temp 98.4 F (36.9 C)  Temp Source Oral  Pulse Rate 86  Pulse Rate Source Monitor  Resp 17  BP (!) 178/93  BP Location Right Arm  BP Method Automatic  Patient Position (if appropriate) Sitting  Oxygen Therapy  SpO2 100 %  O2 Device Room Air  Pulse Oximetry Type Continuous  Pain Assessment  Pain Scale 0-10  Pain Score 0  Dialysis Weight  Weight 88.5 kg  Type of Weight Pre-Dialysis  Time-Out for Hemodialysis  What Procedure? HD  Pt Identifiers(min of two) First/Last Name;MRN/Account#  Correct Site? Yes  Correct Side? Yes  Correct Procedure? Yes  Consents Verified? Yes  Rad Studies Available? N/A  Safety Precautions Reviewed? Yes  Engineer, civil (consulting) Number 5  Station Number 3  UF/Alarm Test Passed  Conductivity: Meter 14  Conductivity: Machine  13.9  pH 7.4  Reverse Osmosis Main  Normal Saline Lot Number [id number]  Dialyzer Lot Number [id number]  Disposable Set Lot Number [id number]  Machine Temperature 98.6 F (37 C)  Musician and Audible Yes  Blood Lines Intact and Secured Yes  Pre Treatment Patient Checks  Vascular access used during treatment Graft  Patient is receiving dialysis in a chair Yes  Hepatitis B Surface Antigen Results Negative  Date Hepatitis B Surface Antigen Drawn 02/22/18  Hepatitis B Surface Antibody 156  Date Hepatitis B Surface Antibody Drawn 12/09/17  Hemodialysis Consent Verified Yes  Hemodialysis Standing Orders Initiated Yes  ECG (Telemetry) Monitor On Yes  Prime Ordered Normal Saline  Length of  DialysisTreatment -hour(s) 3 Hour(s)  Dialysis Treatment Comments Na 140  Dialyzer Elisio 17H NR  Dialysate 2K, 2.5 Ca  Dialysis Anticoagulant None  Dialysate Flow Ordered 800  Blood Flow Rate Ordered 400 mL/min  Ultrafiltration Goal 0 Liters (pt is at ED[facility] )  Dialysis Blood Pressure Support  Ordered Normal Saline  Education / Care Plan  Dialysis Education Provided Yes  Documented Education in Care Plan Yes  Fistula / Graft Left Upper arm Arteriovenous vein graft  Placement Date/Time: 06/15/15 1318   Placed prior to admission: No  Orientation: Left  Access Location: Upper arm  Access Type: Arteriovenous vein graft  Expiration Date: 12/31/17  Site Condition No complications  Fistula / Graft Assessment Present;Thrill;Bruit

## 2018-04-02 NOTE — Progress Notes (Signed)
HD Tx completed , tolerated well, no UF as ordered    04/02/18 1230  Vital Signs  Pulse Rate 99  Pulse Rate Source Monitor  Resp 19  BP (!) 171/86  BP Location Right Arm  BP Method Automatic  Patient Position (if appropriate) Sitting  During Hemodialysis Assessment  Blood Flow Rate (mL/min) 400 mL/min  Arterial Pressure (mmHg) -80 mmHg  Venous Pressure (mmHg) 250 mmHg  Transmembrane Pressure (mmHg) 60 mmHg  Ultrafiltration Rate (mL/min) 0 mL/min  Dialysate Flow Rate (mL/min) 800 ml/min  Conductivity: Machine  14.4  HD Safety Checks Performed Yes  KECN 61 KECN  Dialysis Fluid Bolus Normal Saline  Bolus Amount (mL) 250 mL  Intra-Hemodialysis Comments Tx completed;Tolerated well

## 2018-04-02 NOTE — Progress Notes (Signed)
Pre HD Assessment    04/02/18 0920  Neurological  Level of Consciousness Alert  Orientation Level Oriented X4  Respiratory  Respiratory Pattern Regular;Unlabored  Chest Assessment Chest expansion symmetrical  Cough None  Cardiac  Pulse Regular  Heart Sounds S1, S2  ECG Monitor Yes  Cardiac Rhythm NSR  Vascular  R Radial Pulse +2  L Radial Pulse +2  Psychosocial  Psychosocial (WDL) WDL

## 2018-04-02 NOTE — Progress Notes (Signed)
Post HD Assessment    04/02/18 1235  Neurological  Orientation Level Oriented X4  Cardiac  Pulse Regular  Heart Sounds S1, S2  ECG Monitor Yes  Cardiac Rhythm NSR  Vascular  R Radial Pulse +2  L Radial Pulse +2  Psychosocial  Psychosocial (WDL) WDL

## 2018-04-02 NOTE — Plan of Care (Signed)
  Problem: Safety: Goal: Periods of time without injury will increase Outcome: Progressing   

## 2018-04-02 NOTE — Progress Notes (Addendum)
Inpatient Diabetes Program Recommendations  AACE/ADA: New Consensus Statement on Inpatient Glycemic Control (2015)  Target Ranges:  Prepandial:   less than 140 mg/dL      Peak postprandial:   less than 180 mg/dL (1-2 hours)      Critically ill patients:  140 - 180 mg/dL   Results for LESTAT, GOLOB (MRN [id number]) as of 04/02/2018 09:00  Ref. Range 04/01/2018 07:19 04/01/2018 11:39 04/01/2018 16:59 04/01/2018 17:21 04/01/2018 17:37 04/01/2018 20:48  Glucose-Capillary Latest Ref Range: 70 - 99 mg/dL 113 (H)  8 units NOVOLOG +  12 units LANTUS  83  8 units NOVOLOG  34 (LL) 46 (L) 96 224 (H)   Results for CHAYSE, ZATARAIN (MRN [id number]) as of 04/02/2018 09:00  Ref. Range 04/02/2018 07:12  Glucose-Capillary Latest Ref Range: 70 - 99 mg/dL 167 (H)    Home DM Meds: Lantus 14 units Daily/ 8 units QHS       Novolog 0-9 units TID with meals  Current Orders: Lantus 12 units BID      Novolog 0-5 units TID with meals      Novolog 8 units TID with meals    Scheduled for Dialysis today.  Hypoglycemic yesterday at 5pm after receiving 8 units Novolog Meal Coverage at 11:30am.   MD- Please note that Lantus was HELD last PM by the RN.  Lantus NOT given this AM b/c pt REFUSED dose.  Concern that patient will be at high risk for DKA given he is listed as having Type 1 Diabetes.  Without basal insulin on board, patient could develop DKA.  Spoke with Chrys Racer, RN this AM and she plans to try to give Mr. Allemand the Lantus 12 units (that was due this AM) when pt gets back from Dialysis.  Suspect Hypoglycemia yesterday afternoon was caused by too much Novolog at 11:30am.  Please consider the following:  Reduce Novolog Meal Coverage to: Novolog 6 units TID with meals  (Please add the following Hold Parameters: Hold if pt eats <50% of meal, Hold if pt NPO)     --Will follow patient during hospitalization--  Wyn Quaker RN, MSN, CDE Diabetes  Coordinator Inpatient Glycemic Control Team Team Pager: [phone] (8a-5p)

## 2018-04-02 NOTE — Progress Notes (Signed)
Patient is paranoid about staff "f---ing with his food."  He stated to MD that he thinks staff is pooping and spitting in his food.  Ordered sealed tray for patient, and he requested to eat in his room.  Due to his multiple medical issues and paranoia, he was allowed to do so.  Opened his medications in front of him, and he agreed to take them.  He was pleasant; thanked staff and ate his entire tray.  Also had a call from Edward Colon, his wife.  She informed nurse that she lives in [place] with their children.  Her number is [phone].

## 2018-04-02 NOTE — Progress Notes (Signed)
HD Tx started w/ reduced BFR for elevated venous pressures.    04/02/18 0930  Vital Signs  Pulse Rate 74  Pulse Rate Source Monitor  Resp 17  BP (!) 170/91  BP Location Right Arm  BP Method Automatic  Patient Position (if appropriate) Sitting  Oxygen Therapy  SpO2 100 %  O2 Device Room Air  Pulse Oximetry Type Continuous  During Hemodialysis Assessment  Blood Flow Rate (mL/min) 400 mL/min (unable to maintain BFR of 450, MD aware )  Arterial Pressure (mmHg) -80 mmHg  Venous Pressure (mmHg) 220 mmHg  Transmembrane Pressure (mmHg) 70 mmHg  Ultrafiltration Rate (mL/min) 0 mL/min  Dialysate Flow Rate (mL/min) 800 ml/min  Conductivity: Machine  14  HD Safety Checks Performed Yes  Dialysis Fluid Bolus Normal Saline  Bolus Amount (mL) 250 mL  Intra-Hemodialysis Comments Tx initiated  Fistula / Graft Left Upper arm Arteriovenous vein graft  Placement Date/Time: 06/15/15 1318   Placed prior to admission: No  Orientation: Left  Access Location: Upper arm  Access Type: Arteriovenous vein graft  Expiration Date: 12/31/17  Status Accessed;Flushed;Patent  Needle Size 15 g  Drainage Description None

## 2018-04-02 NOTE — Progress Notes (Signed)
Post HD Tx    04/02/18 1235  Vital Signs  Temp 98.4 F (36.9 C)  Temp Source Oral  Pulse Rate 86  Pulse Rate Source Monitor  Resp 19  BP (!) 163/89  BP Location Right Arm  BP Method Automatic  Patient Position (if appropriate) Lying  Pain Assessment  Pain Scale 0-10  Pain Score 0  Dialysis Weight  Weight 88 kg  Type of Weight Post-Dialysis  Post-Hemodialysis Assessment  Rinseback Volume (mL) 250 mL  KECN 61 V  Dialyzer Clearance Lightly streaked  Duration of HD Treatment -hour(s) 3 hour(s)  Hemodialysis Intake (mL) 500 mL  UF Total -Machine (mL) 530 mL  Net UF (mL) 30 mL  Tolerated HD Treatment Yes  AVG/AVF Arterial Site Held (minutes) 10 minutes  AVG/AVF Venous Site Held (minutes) 10 minutes  Fistula / Graft Left Upper arm Arteriovenous vein graft  Placement Date/Time: 06/15/15 1318   Placed prior to admission: No  Orientation: Left  Access Location: Upper arm  Access Type: Arteriovenous vein graft  Expiration Date: 12/31/17  Site Condition No complications  Fistula / Graft Assessment Present;Thrill;Bruit  Status Deaccessed  Drainage Description None

## 2018-04-02 NOTE — Progress Notes (Signed)
Edward Colon was isolative in room, sad and depressed. Refusing to talk to staff. Refused to come to the medication room. Refused snack. Medication taken to him in room. Did not participate in evening activities. Was encouraged to express his feelings and thoughts as needed. Patient slept  throughout the night and currently awake and approachable. Emotional support provided. Safety monitored.

## 2018-04-02 NOTE — Progress Notes (Signed)
D: Patient refusing all medications this a.m.  Patient also refused to eat breakfast.  Spoke to [facility] in Dialysis Dept and patient is scheduled to receive treatment this morning.  Medication was taken to his room, however, he refuses.  Spoke with dialysis nurse regarding which medications he can receive.  Patient is basically mute, refusing to acknowledge that nurse is present.  He rates his depression and hopelessness as a 9; anxiety as a 10.  He denies any thoughts of self harm.  His goal today is to "keep calm after nurse pissed me off last night.  I was having anxiety, but she added to it."  Patient is currently on his way for dialysis treatment.  He will have a sitter remain with him.  A: Continue to monitor medication management and MD orders.  Safety checks completed every 15 minutes per protocol.  Offer support and encouragement as needed.  R: Patient isolative to room.  Minimal interaction with staff.

## 2018-04-02 NOTE — Progress Notes (Signed)
Patient returned from dialysis.  Offered his lunch tray and he stated, "ya'll need to quit fucking around with my food."  I asked him again and he stated, "you heard me."  Attempted to talk with him about why he felt that way, and patient refused to talk to me.  I administered 12 U of Lantus, and told him the Lantus was non-negotiable, because he did not get it this morning.  He allowed me to administer the lantus.  He refused all of his other medication.  Informed Dr. Weber Cooks of patient's status.

## 2018-04-02 NOTE — Progress Notes (Signed)
Recreation Therapy Notes  Date: 04/02/2018  Time: 9:30 am   Location: Craft room   Behavioral response: N/A   Intervention Topic: Problem Solving  Discussion/Intervention: Patient did not attend group.   Clinical Observations/Feedback:  Patient did not attend group.   Taunia Frasco LRT/CTRS        Roneka Gilpin 04/02/2018 10:20 AM

## 2018-04-02 NOTE — Progress Notes (Signed)
[place], [place] 04/02/18  Subjective:   Patient seen prior to dialysis.  His potassium level is elevated today at 6.0 Records obtained from outpatient dialysis.  He is at his dry weight today Denies any acute shortness of breath or leg edema  Objective:  Vital signs in last 24 hours:  Temp:  [98.4 F (36.9 C)-98.6 F (37 C)] 98.4 F (36.9 C) (01/27 0926) Pulse Rate:  [74-97] 89 (01/27 1015) Resp:  [17-18] 17 (01/27 1015) BP: (148-178)/(83-93) 169/83 (01/27 1015) SpO2:  [100 %] 100 % (01/27 0930) Weight:  [88.5 kg] 88.5 kg (01/27 0926)  Weight change:  Filed Weights   03/29/18 1228 03/31/18 1333 04/02/18 0926  Weight: 86.3 kg 87.2 kg 88.5 kg    Intake/Output:   No intake or output data in the 24 hours ending 04/02/18 1023   Physical Exam: General:  No acute distress, sitting in the chair  HEENT  anicteric, moist oral mucous membranes  Neck  supple, no masses  Pulm/lungs  normal breathing effort, clear to auscultation  CVS/Heart  regular rhythm, 2/6 systolic murmur  Abdomen:   Soft, nontender  Extremities:  No peripheral edema  Neurologic:  Alert, oriented  Skin:  No acute rashes  Access:  Left arm AV graft       Basic Metabolic Panel:  Recent Labs  Lab 03/27/18 0434 03/28/18 0439 03/29/18 0900 03/31/18 0930 04/02/18 0749  NA 137 135 133* 133* 134*  K 4.3 5.0 5.2* 4.9 6.0*  CL 96* 99 95* 94* 94*  CO2 24 26 25 25 25   GLUCOSE 39* 217* 247* 170* 186*  BUN 91* 48* 71* 79* 89*  CREATININE 17.37* 11.09* 13.65* 12.81* 13.81*  CALCIUM 9.1 7.9* 8.3* 8.6* 9.1  PHOS  --   --  4.5 3.7 4.6     CBC: Recent Labs  Lab 03/27/18 0740 03/29/18 0900 03/31/18 0930 04/02/18 0749  WBC 4.7 4.2 4.0 4.5  HGB 10.8* 10.6* 10.1* 10.2*  HCT 32.3* 32.3* 29.8* 30.3*  MCV 86.4 86.4 83.7 85.1  PLT 309 329 364 380      Lab Results  Component Value Date   HEPBSAG Negative 03/04/2015   HEPBSAB Reactive 03/04/2015       Microbiology:  No results found for this or any previous visit (from the past 240 hour(s)).  Coagulation Studies: No results for input(s): LABPROT, INR in the last 72 hours.  Urinalysis: No results for input(s): COLORURINE, LABSPEC, PHURINE, GLUCOSEU, HGBUR, BILIRUBINUR, KETONESUR, PROTEINUR, UROBILINOGEN, NITRITE, LEUKOCYTESUR in the last 72 hours.  Invalid input(s): APPERANCEUR    Imaging: No results found.   Medications:   . sodium chloride    . sodium chloride     . buPROPion  150 mg Oral Daily  . calcium acetate  2,001 mg Oral TID WC  . Chlorhexidine Gluconate Cloth  6 each Topical Q0600  . cinacalcet  90 mg Oral Q T,Th,Sa-HD  . FLUoxetine  20 mg Oral Daily  . insulin aspart  0-5 Units Subcutaneous TID WC  . insulin aspart  8 Units Subcutaneous TID WC  . insulin glargine  12 Units Subcutaneous BID  . isosorbide-hydrALAZINE  1 tablet Oral TID  . levETIRAcetam  250 mg Oral Q T,Th,Sat-1800  . levETIRAcetam  500 mg Oral Q0600  . minoxidil  2.5 mg Oral q1800  . multivitamin  1 tablet Oral QHS  . NIFEdipine  30 mg Oral BID   sodium chloride, sodium chloride, acetaminophen, alteplase, alum & mag hydroxide-simeth,  heparin, heparin, heparin, lidocaine (PF), lidocaine-prilocaine, pentafluoroprop-tetrafluoroeth  Assessment/ Plan:  38 y.o. African-American male with end stage renal disease on hemodialysis, depression, diabetes mellitus type I insulin dependent, and hypertension, who [facility]  TTS [facility] /Left AVG/88.5 kg  1.  End-stage renal disease 2.  Hyperkalemia 3.  Hypertension with chronic kidney disease 4.  Anemia of chronic kidney disease 5.  Secondary hyperparathyroidism  Urgent hemodialysis today due to hyperkalemia.  Patient is at his dry weight therefore ultrafiltration goal is 0 Continue calcium acetate with meals and cinacalcet Minoxidil and  nifedipine for hypertension We will consider adding losartan for hypertension once potassium is better controlled Repeat potassium in the morning.  Next hemodialysis on Thursday unless potassium is elevated.    LOS: [place] 1/27/202010:23 AM  [place], [place]  Note: This note was prepared with Dragon dictation. Any transcription errors are unintentional

## 2018-04-02 NOTE — Progress Notes (Signed)
D: Pt denies SI/HI/AV hallucinations. Pt has been in bed the beginning of shift. Patient is pleasant and cooperative.  A: Pt was offered support and encouragement. Pt was given scheduled medications. Pt was encourage to attend groups. Q 15 minute checks were done for safety.  R: Pt is taking medication. Pt has no complaints.Pt receptive to treatment and safety maintained on unit.

## 2018-04-02 NOTE — Progress Notes (Signed)
[facility] MD Progress Note  04/02/2018 3:36 PM Edward Colon  MRN:  [id number] Subjective: Follow-up for this patient with multiple medical problems and depression.  Patient seen chart reviewed.  Spoke with nursing today.  Patient has been refusing to eat today.  When I spoke to him this afternoon he told me that somebody was peeing or pooping in his food which is why he will not eat it.  Patient insisted that he would show me photographs on his cell phone which he insisted were right next to his bed.  Seems confused and withdrawn.  Hard to tell if he is delirious or psychotic.  Not attending groups much.  Passively participates with medical care. Principal Problem: MDD (major depressive disorder), recurrent severe, without psychosis ([place]) Diagnosis: Principal Problem:   MDD (major depressive disorder), recurrent severe, without psychosis ([place]) Active Problems:   Seizures ([place])   Hypertension   Type 1 diabetes ([place])   Acute on chronic renal failure ([place])  Total Time spent with patient: 30 minutes  Past Psychiatric History: Patient has a history of prior evaluations for depression but has not followed up with outpatient treatment largely because of his past 64  Past Medical History:  Past Medical History:  Diagnosis Date  . Anemia   . Blind left eye since ~ 2010  . Depression   . Diabetic peripheral neuropathy ([place])   . ESRD (end stage renal disease) on dialysis [facility])    "TTS; [place] [facility]; [facility]" (02/01/2016)  . Heart murmur    denies any problems with it  . Hypertension   . Seizures ([place])    "last one was end of 2016; they are related to my diabetes" (02/01/2016)  . Type 1 diabetes ([place]) dx'd 1990    Past Surgical History:  Procedure Laterality Date  . AV FISTULA PLACEMENT Right 03/03/2015   Procedure: RIGHT RADIO-CEPHALIC ARTERIOVENOUS (AV) FISTULA CREATION;  Surgeon: Serafina Mitchell, MD;  Location: [facility] OR;  Service: Vascular;  Laterality: Right;  . AV FISTULA PLACEMENT  Left 06/15/2015   Procedure: INSERTION OF LEFT UPPER ARM  ARTERIOVENOUS (AV) 70mm x 50cm GORE-TEX GRAFT;  Surgeon: Elam Dutch, MD;  Location: [place];  Service: Vascular;  Laterality: Left;  . BASCILIC VEIN TRANSPOSITION Left 04/01/2015   Procedure: BASCILIC VEIN TRANSPOSITION-LEFT ARM- FIRST STAGE;  Surgeon: Elam Dutch, MD;  Location: [place];  Service: Vascular;  Laterality: Left;  . EYE SURGERY Right ~ 2010   for diabetic retinopathy  . INSERTION OF DIALYSIS CATHETER Right 03/03/2015   Procedure: INSERTION OF DIALYSIS CATHETER;  Surgeon: Serafina Mitchell, MD;  Location: [facility] OR;  Service: Vascular;  Laterality: Right;   Family History:  Family History  Problem Relation Age of Onset  . Diabetes Neg Hx    Family Psychiatric  History: Unknown Social History:  Social History   Substance and Sexual Activity  Alcohol Use No  . Frequency: Never   Comment: 02/01/2016 "might have 1 drink/year"     Social History   Substance and Sexual Activity  Drug Use No    Social History   Socioeconomic History  . Marital status: Single    Spouse name: Not on file  . Number of children: Not on file  . Years of education: Not on file  . Highest education level: Not on file  Occupational History  . Not on file  Social Needs  . Financial resource strain: Not on file  . Food insecurity:    Worry: Not on  file    Inability: Not on file  . Transportation needs:    Medical: Not on file    Non-medical: Not on file  Tobacco Use  . Smoking status: Never Smoker  . Smokeless tobacco: Never Used  Substance and Sexual Activity  . Alcohol use: No    Frequency: Never    Comment: 02/01/2016 "might have 1 drink/year"  . Drug use: No  . Sexual activity: Yes  Lifestyle  . Physical activity:    Days per week: Not on file    Minutes per session: Not on file  . Stress: Not on file  Relationships  . Social connections:    Talks on phone: Not on file    Gets together: Not on file    Attends  religious service: Not on file    Active member of club or organization: Not on file    Attends meetings of clubs or organizations: Not on file    Relationship status: Not on file  Other Topics Concern  . Not on file  Social History Narrative  . Not on file   Additional Social History:                         Sleep: Fair  Appetite:  Poor  Current Medications: Current Facility-Administered Medications  Medication Dose Route Frequency Provider Last Rate Last Dose  . 0.9 %  sodium chloride infusion  100 mL Intravenous PRN Alric Seton, PA-C      . 0.9 %  sodium chloride infusion  100 mL Intravenous PRN Alric Seton, PA-C      . acetaminophen (TYLENOL) tablet 650 mg  650 mg Oral Q4H PRN [name], MD      . alteplase (CATHFLO ACTIVASE) injection 2 mg  2 mg Intracatheter Once PRN Alric Seton, PA-C      . alum & mag hydroxide-simeth (MAALOX/MYLANTA) 200-200-20 MG/5ML suspension 30 mL  30 mL Oral Q4H PRN [name], MD      . calcium acetate (PHOSLO) capsule 2,001 mg  2,001 mg Oral TID WC [name], MD   2,001 mg at 04/01/18 1716  . Chlorhexidine Gluconate Cloth 2 % PADS 6 each  6 each Topical Q0600 [name], MD   6 each at 03/31/18 0900  . cinacalcet (SENSIPAR) tablet 90 mg  90 mg Oral Q T,Th,Sa-HD [name], MD   90 mg at 03/29/18 1733  . FLUoxetine (PROZAC) capsule 20 mg  20 mg Oral Daily [name], MD   20 mg at 04/01/18 0947  . heparin injection 1,000 Units  1,000 Units Dialysis PRN Alric Seton, PA-C      . heparin injection 1,700 Units  20 Units/kg Dialysis PRN Alric Seton, PA-C      . heparin injection 4,300 Units  50 Units/kg Dialysis PRN Kolluru, Sarath, MD      . insulin aspart (novoLOG) injection 0-5 Units  0-5 Units Subcutaneous TID WC , [name], MD   3 Units at 03/31/18 1625  . insulin aspart (novoLOG) injection 6 Units  6 Units Subcutaneous TID WC ,  T, MD      . insulin glargine (LANTUS)  injection 12 Units  12 Units Subcutaneous BID Saundra Shelling, MD   12 Units at 04/02/18 1242  . isosorbide-hydrALAZINE (BIDIL) 20-37.5 MG per tablet 1 tablet  1 tablet Oral TID [name], MD   1 tablet at 04/01/18 1716  . levETIRAcetam (KEPPRA)  tablet 250 mg  250 mg Oral Q T,Th,Sat-1800 [name], MD   250 mg at 03/31/18 1732  . levETIRAcetam (KEPPRA) tablet 500 mg  500 mg Oral Q0600 [name], MD   500 mg at 04/02/18 1950  . lidocaine (PF) (XYLOCAINE) 1 % injection 5 mL  5 mL Intradermal PRN Alric Seton, PA-C      . lidocaine-prilocaine (EMLA) cream 1 application  1 application Topical PRN Alric Seton, PA-C      . minoxidil (LONITEN) tablet 2.5 mg  2.5 mg Oral q1800 Kolluru, Sarath, MD   2.5 mg at 04/01/18 1716  . multivitamin (RENA-VIT) tablet 1 tablet  1 tablet Oral QHS [name], MD   1 tablet at 04/01/18 2116  . NIFEdipine (PROCARDIA-XL/NIFEDICAL-XL) 24 hr tablet 30 mg  30 mg Oral BID [name], MD   30 mg at 04/01/18 1716  . pentafluoroprop-tetrafluoroeth (GEBAUERS) aerosol 1 application  1 application Topical PRN Alric Seton, PA-C      . risperiDONE (RISPERDAL M-TABS) disintegrating tablet 0.5 mg  0.5 mg Oral BID , [name], MD        Lab Results:  Results for orders placed or performed during the hospital encounter of 03/28/18 (from the past 48 hour(s))  Glucose, capillary     Status: Abnormal   Collection Time: 03/31/18  4:17 PM  Result Value Ref Range   Glucose-Capillary 255 (H) 70 - 99 mg/dL  Glucose, capillary     Status: Abnormal   Collection Time: 03/31/18  8:28 PM  Result Value Ref Range   Glucose-Capillary 135 (H) 70 - 99 mg/dL   Comment 1 Notify RN   Glucose, capillary     Status: Abnormal   Collection Time: 04/01/18  7:19 AM  Result Value Ref Range   Glucose-Capillary 113 (H) 70 - 99 mg/dL   Comment 1 Notify RN   Glucose, capillary     Status: None   Collection Time: 04/01/18 11:39 AM  Result Value Ref Range    Glucose-Capillary 83 70 - 99 mg/dL  Glucose, capillary     Status: Abnormal   Collection Time: 04/01/18  4:59 PM  Result Value Ref Range   Glucose-Capillary 34 (LL) 70 - 99 mg/dL   Comment 1 Notify RN   Glucose, capillary     Status: Abnormal   Collection Time: 04/01/18  5:21 PM  Result Value Ref Range   Glucose-Capillary 46 (L) 70 - 99 mg/dL  Glucose, capillary     Status: None   Collection Time: 04/01/18  5:37 PM  Result Value Ref Range   Glucose-Capillary 96 70 - 99 mg/dL  Glucose, capillary     Status: Abnormal   Collection Time: 04/01/18  8:48 PM  Result Value Ref Range   Glucose-Capillary 224 (H) 70 - 99 mg/dL   Comment 1 Notify RN   Glucose, capillary     Status: Abnormal   Collection Time: 04/02/18  7:12 AM  Result Value Ref Range   Glucose-Capillary 167 (H) 70 - 99 mg/dL   Comment 1 Notify RN   CBC     Status: Abnormal   Collection Time: 04/02/18  7:49 AM  Result Value Ref Range   WBC 4.5 4.0 - 10.5 K/uL   RBC 3.56 (L) 4.22 - 5.81 MIL/uL   Hemoglobin 10.2 (L) 13.0 - 17.0 g/dL   HCT 30.3 (L) 39.0 - 52.0 %   MCV 85.1 80.0 - 100.0 fL   MCH 28.7 26.0 -  34.0 pg   MCHC 33.7 30.0 - 36.0 g/dL   RDW 12.7 11.5 - 15.5 %   Platelets 380 150 - 400 K/uL   nRBC 0.0 0.0 - 0.2 %    Comment: Performed at [facility], [address] [place], [place] [address]  Renal function panel     Status: Abnormal   Collection Time: 04/02/18  7:49 AM  Result Value Ref Range   Sodium 134 (L) 135 - 145 mmol/L   Potassium 6.0 (H) 3.5 - 5.1 mmol/L   Chloride 94 (L) 98 - 111 mmol/L   CO2 25 22 - 32 mmol/L   Glucose, Bld 186 (H) 70 - 99 mg/dL   BUN 89 (H) 6 - 20 mg/dL   Creatinine, Ser 13.81 (H) 0.61 - 1.24 mg/dL   Calcium 9.1 8.9 - 10.3 mg/dL   Phosphorus 4.6 2.5 - 4.6 mg/dL   Albumin 3.6 3.5 - 5.0 g/dL   GFR calc non Af Amer 4 (L) >60 mL/min   GFR calc Af Amer 5 (L) >60 mL/min   Anion gap 15 5 - 15    Comment: Performed at [facility], [address]  [place], [place] [address]  Glucose, capillary     Status: Abnormal   Collection Time: 04/02/18 12:57 PM  Result Value Ref Range   Glucose-Capillary 167 (H) 70 - 99 mg/dL   Comment 1 Notify RN     Blood Alcohol level:  Lab Results  Component Value Date   ETH <10 03/12/2018   ETH <10 14/43/1540    Metabolic Disorder Labs: Lab Results  Component Value Date   HGBA1C 9.1 (H) 02/13/2018   MPG 214.47 02/13/2018   MPG 249 08/31/2014   No results found for: PROLACTIN Lab Results  Component Value Date   TRIG 221 (H) 02/10/2018    Physical Findings: AIMS:  , ,  ,  ,    CIWA:    COWS:     Musculoskeletal: Strength & Muscle Tone: within normal limits Gait & Station: shuffle Patient leans: N/A  Psychiatric Specialty Exam: Physical Exam  Nursing note and vitals reviewed. Constitutional: He appears well-developed and well-nourished.  HENT:  Head: Normocephalic and atraumatic.  Eyes: Pupils are equal, round, and reactive to light. Conjunctivae are normal.  Neck: Normal range of motion.  Cardiovascular: Regular rhythm and normal heart sounds.  Respiratory: Effort normal. No respiratory distress.  GI: Soft.  Musculoskeletal: Normal range of motion.  Neurological: He is alert.  Skin: Skin is warm and dry.  Psychiatric: His affect is blunt and inappropriate. He is withdrawn. Thought content is paranoid and delusional. Cognition and memory are impaired. He expresses inappropriate judgment. He is noncommunicative.    Review of Systems  Constitutional: Negative.   HENT: Negative.   Eyes: Negative.   Respiratory: Negative.   Cardiovascular: Negative.   Gastrointestinal: Negative.   Musculoskeletal: Negative.   Skin: Negative.   Neurological: Negative.   Psychiatric/Behavioral: Positive for depression.    Blood pressure (!) 163/89, pulse 86, temperature 98.4 F (36.9 C), temperature source Oral, resp. rate 19, height 6' (1.829 m), weight 88 kg, SpO2 100 %.Body mass index is 26.31  kg/m.  General Appearance: Disheveled  Eye Contact:  None  Speech:  Slow  Volume:  Decreased  Mood:  Dysphoric  Affect:  Congruent  Thought Process:  Disorganized  Orientation:  Full (Time, Place, and Person)  Thought Content:  Illogical, Delusions and Paranoid Ideation  Suicidal Thoughts:  No  Homicidal  Thoughts:  No  Memory:  Immediate;   Fair Recent;   Poor Remote;   Poor  Judgement:  Impaired  Insight:  Shallow  Psychomotor Activity:  Decreased  Concentration:  Concentration: Poor  Recall:  Poor  Fund of Knowledge:  Poor  Language:  Fair  Akathisia:  No  Handed:  Right  AIMS (if indicated):     Assets:  Desire for Improvement  ADL's:  Impaired  Cognition:  Impaired,  Mild and Moderate  Sleep:  Number of Hours: 8     Treatment Plan Summary: Daily contact with patient to assess and evaluate symptoms and progress in treatment, Medication management and Plan I am getting more worried about this patient as he is not showing any signs of getting better or interacting and now we have reason to think he may be psychotic.  I am switching his medicine by discontinuing the newly added Wellbutrin and replacing it with low-dose Risperdal.  I am starting to think that this would be a good ECT patient.  There is no known family involved that could give consent for any kind of treatment.  I talked with the patient about it but he did not even give any sign that he was paying attention to me.  Spoke with nursing today.  Continue dialysis and monitoring of blood sugars.  Alethia Berthold, MD 04/02/2018, 3:36 PM

## 2018-04-02 NOTE — [facility] Group Notes (Signed)

## 2018-04-03 LAB — GLUCOSE, CAPILLARY
Glucose-Capillary: 32 mg/dL — CL (ref 70–99)
Glucose-Capillary: 144 mg/dL — ABNORMAL HIGH (ref 70–99)
Glucose-Capillary: 390 mg/dL — ABNORMAL HIGH (ref 70–99)
Glucose-Capillary: 156 mg/dL — ABNORMAL HIGH (ref 70–99)
Glucose-Capillary: 267 mg/dL — ABNORMAL HIGH (ref 70–99)

## 2018-04-03 LAB — RENAL FUNCTION PANEL
Albumin: 3.4 g/dL — ABNORMAL LOW (ref 3.5–5.0)
Anion gap: 12 (ref 5–15)
BUN: 67 mg/dL — ABNORMAL HIGH (ref 6–20)
CO2: 29 mmol/L (ref 22–32)
Calcium: 8.5 mg/dL — ABNORMAL LOW (ref 8.9–10.3)
Chloride: 92 mmol/L — ABNORMAL LOW (ref 98–111)
Creatinine, Ser: 12.91 mg/dL — ABNORMAL HIGH (ref 0.61–1.24)
GFR calc Af Amer: 5 mL/min — ABNORMAL LOW (ref >60)
GFR, EST NON AFRICAN AMERICAN: 4 mL/min — AB (ref >60)
Glucose, Bld: 260 mg/dL — ABNORMAL HIGH (ref 70–99)
Phosphorus: 4.5 mg/dL (ref 2.5–4.6)
Potassium: 5.6 mmol/L — ABNORMAL HIGH (ref 3.5–5.1)
Sodium: 133 mmol/L — ABNORMAL LOW (ref 135–145)

## 2018-04-03 LAB — POTASSIUM: Potassium: 6.9 mmol/L (ref 3.5–5.1)

## 2018-04-03 NOTE — Progress Notes (Signed)
Dialysis completed without issue. No uf as ordered. Patient did have prolonged bleeding post dialysis from arterial needle site. Pressure dressing applied. Hemostasis achieved in approx 25 minutes. Discussed with staff to call if bleeding reoccurs. Dr. Candiss Norse in unit and aware.

## 2018-04-03 NOTE — Progress Notes (Signed)
[facility] [place], [place] 04/03/18  Subjective:   Urgent reevaluation requested this morning for potassium of 6.9.  Patient was brought in for dialysis suite for an extra dialysis session.  Weight is about the same.  No shortness of breath.  No leg edema.  Patient still appears depressed.  States he might have ate too much fruit.  Objective:  Vital signs in last 24 hours:  Temp:  [98.2 F (36.8 C)] 98.2 F (36.8 C) (01/28 1340) Pulse Rate:  [93-105] 105 (01/28 1430) Resp:  [19-20] 19 (01/28 1430) BP: (139-161)/(69-83) 155/81 (01/28 1430) SpO2:  [99 %] 99 % (01/28 1340) Weight:  [87.5 kg-88.9 kg] 87.5 kg (01/28 1340)  Weight change:  Filed Weights   04/02/18 1235 04/03/18 0728 04/03/18 1340  Weight: 88 kg 88.9 kg 87.5 kg    Intake/Output:   No intake or output data in the 24 hours ending 04/03/18 1456   Physical Exam: General:  No acute distress, sitting in the chair  HEENT  anicteric, moist oral mucous membranes  Neck  supple, no masses  Pulm/lungs  normal breathing effort, clear to auscultation  CVS/Heart  regular rhythm, 2/6 systolic murmur  Abdomen:   Soft, nontender  Extremities:  No peripheral edema  Neurologic:  Alert, oriented  Skin:  No acute rashes  Access:  Left arm AV graft       Basic Metabolic Panel:  Recent Labs  Lab 03/28/18 0439 03/29/18 0900 03/31/18 0930 04/02/18 0749 04/03/18 1016 04/03/18 1353  NA 135 133* 133* 134*  --  133*  K 5.0 5.2* 4.9 6.0* 6.9* 5.6*  CL 99 95* 94* 94*  --  92*  CO2 26 25 25 25   --  29  GLUCOSE 217* 247* 170* 186*  --  260*  BUN 48* 71* 79* 89*  --  67*  CREATININE 11.09* 13.65* 12.81* 13.81*  --  12.91*  CALCIUM 7.9* 8.3* 8.6* 9.1  --  8.5*  PHOS  --  4.5 3.7 4.6  --  4.5     CBC: Recent Labs  Lab 03/29/18 0900 03/31/18 0930 04/02/18 0749  WBC 4.2 4.0 4.5  HGB 10.6* 10.1* 10.2*  HCT 32.3* 29.8* 30.3*  MCV 86.4 83.7 85.1  PLT 329 364 380      Lab Results  Component Value  Date   HEPBSAG Negative 03/04/2015   HEPBSAB Reactive 03/04/2015      Microbiology:  No results found for this or any previous visit (from the past 240 hour(s)).  Coagulation Studies: No results for input(s): LABPROT, INR in the last 72 hours.  Urinalysis: No results for input(s): COLORURINE, LABSPEC, PHURINE, GLUCOSEU, HGBUR, BILIRUBINUR, KETONESUR, PROTEINUR, UROBILINOGEN, NITRITE, LEUKOCYTESUR in the last 72 hours.  Invalid input(s): APPERANCEUR    Imaging: No results found.   Medications:   . sodium chloride    . sodium chloride     . calcium acetate  2,001 mg Oral TID WC  . Chlorhexidine Gluconate Cloth  6 each Topical Q0600  . cinacalcet  90 mg Oral Q T,Th,Sa-HD  . FLUoxetine  20 mg Oral Daily  . insulin aspart  0-5 Units Subcutaneous TID WC  . insulin aspart  6 Units Subcutaneous TID WC  . insulin glargine  12 Units Subcutaneous BID  . isosorbide-hydrALAZINE  1 tablet Oral TID  . levETIRAcetam  250 mg Oral Q T,Th,Sat-1800  . levETIRAcetam  500 mg Oral Q0600  . minoxidil  2.5 mg Oral q1800  . multivitamin  1  tablet Oral QHS  . NIFEdipine  30 mg Oral BID  . risperiDONE  0.5 mg Oral BID   sodium chloride, sodium chloride, acetaminophen, alteplase, alum & mag hydroxide-simeth, heparin, heparin, heparin, lidocaine (PF), lidocaine-prilocaine, pentafluoroprop-tetrafluoroeth  Assessment/ Plan:  38 y.o. African-American male with end stage renal disease on hemodialysis, depression, diabetes mellitus type I insulin dependent, and hypertension, who [facility]  TTS [facility]  /Left AVG/88.5 kg  1.  End-stage renal disease 2.  Hyperkalemia 3.  Hypertension with chronic kidney disease 4.  Anemia of chronic kidney disease 5.  Secondary hyperparathyroidism  Urgent hemodialysis today due to hyperkalemia.  Patient is at his dry weight therefore ultrafiltration goal is  0 Repeat renal panel for confirmation of hyperkalemia. Continue calcium acetate with meals and cinacalcet Minoxidil and nifedipine for hypertension We will consider adding losartan for hypertension once potassium is better controlled  PS: Repeat potassium is 5.6.  We will change to 2K bath.    LOS: [place] 1/28/20202:56 PM  [place], [place]  Note: This note was prepared with Dragon dictation. Any transcription errors are unintentional

## 2018-04-03 NOTE — Progress Notes (Signed)
[facility] MD Progress Note  04/03/2018 4:40 PM Edward Colon  MRN:  [id number] Subjective: Follow-up for this 38 year old man with depression.  Patient seen chart reviewed.  Also spoke to his father by telephone.  Patient continues to present as very flat down and withdrawn and describing self as depressed.  Very low energy and low interest.  He is cooperative however with basic medical care including dialysis.  Today his potassium was found to be 6.9 so he had to receive an extra dialysis session today.  Father gives a lot of useful information that the patient's mood has been getting worse over the past couple years this is about the third time he is wound up in the hospital with depression and he has at this point lost a lot of support and financial resources. Principal Problem: MDD (major depressive disorder), recurrent severe, without psychosis ([place]) Diagnosis: Principal Problem:   MDD (major depressive disorder), recurrent severe, without psychosis ([place]) Active Problems:   Seizures ([place])   Hypertension   Type 1 diabetes ([place])   Acute on chronic renal failure ([place])  Total Time spent with patient: 30 minutes  Past Psychiatric History: Patient has a history of depression as far as I can tell however no actual psychiatric hospitalization.  Possible suicide attempt  Past Medical History:  Past Medical History:  Diagnosis Date  . Anemia   . Blind left eye since ~ 2010  . Depression   . Diabetic peripheral neuropathy ([place])   . ESRD (end stage renal disease) on dialysis [facility])    "TTS; [place]; [facility]" (02/01/2016)  . Heart murmur    denies any problems with it  . Hypertension   . Seizures ([place])    "last one was end of 2016; they are related to my diabetes" (02/01/2016)  . Type 1 diabetes ([place]) dx'd 1990    Past Surgical History:  Procedure Laterality Date  . AV FISTULA PLACEMENT Right 03/03/2015   Procedure: RIGHT RADIO-CEPHALIC ARTERIOVENOUS (AV) FISTULA CREATION;   Surgeon: Serafina Mitchell, MD;  Location: [facility] OR;  Service: Vascular;  Laterality: Right;  . AV FISTULA PLACEMENT Left 06/15/2015   Procedure: INSERTION OF LEFT UPPER ARM  ARTERIOVENOUS (AV) 61mm x 50cm GORE-TEX GRAFT;  Surgeon: Elam Dutch, MD;  Location: [place];  Service: Vascular;  Laterality: Left;  . BASCILIC VEIN TRANSPOSITION Left 04/01/2015   Procedure: BASCILIC VEIN TRANSPOSITION-LEFT ARM- FIRST STAGE;  Surgeon: Elam Dutch, MD;  Location: [place];  Service: Vascular;  Laterality: Left;  . EYE SURGERY Right ~ 2010   for diabetic retinopathy  . INSERTION OF DIALYSIS CATHETER Right 03/03/2015   Procedure: INSERTION OF DIALYSIS CATHETER;  Surgeon: Serafina Mitchell, MD;  Location: [facility] OR;  Service: Vascular;  Laterality: Right;   Family History:  Family History  Problem Relation Age of Onset  . Diabetes Neg Hx    Family Psychiatric  History: None known Social History:  Social History   Substance and Sexual Activity  Alcohol Use No  . Frequency: Never   Comment: 02/01/2016 "might have 1 drink/year"     Social History   Substance and Sexual Activity  Drug Use No    Social History   Socioeconomic History  . Marital status: Single    Spouse name: Not on file  . Number of children: Not on file  . Years of education: Not on file  . Highest education level: Not on file  Occupational History  . Not on file  Social Needs  .  Financial resource strain: Not on file  . Food insecurity:    Worry: Not on file    Inability: Not on file  . Transportation needs:    Medical: Not on file    Non-medical: Not on file  Tobacco Use  . Smoking status: Never Smoker  . Smokeless tobacco: Never Used  Substance and Sexual Activity  . Alcohol use: No    Frequency: Never    Comment: 02/01/2016 "might have 1 drink/year"  . Drug use: No  . Sexual activity: Yes  Lifestyle  . Physical activity:    Days per week: Not on file    Minutes per session: Not on file  . Stress: Not on file   Relationships  . Social connections:    Talks on phone: Not on file    Gets together: Not on file    Attends religious service: Not on file    Active member of club or organization: Not on file    Attends meetings of clubs or organizations: Not on file    Relationship status: Not on file  Other Topics Concern  . Not on file  Social History Narrative  . Not on file   Additional Social History:                         Sleep: Fair  Appetite:  Fair  Current Medications: Current Facility-Administered Medications  Medication Dose Route Frequency Provider Last Rate Last Dose  . 0.9 %  sodium chloride infusion  100 mL Intravenous PRN Alric Seton, PA-C      . 0.9 %  sodium chloride infusion  100 mL Intravenous PRN Alric Seton, PA-C      . acetaminophen (TYLENOL) tablet 650 mg  650 mg Oral Q4H PRN [name], MD      . alteplase (CATHFLO ACTIVASE) injection 2 mg  2 mg Intracatheter Once PRN Alric Seton, PA-C      . alum & mag hydroxide-simeth (MAALOX/MYLANTA) 200-200-20 MG/5ML suspension 30 mL  30 mL Oral Q4H PRN [name], MD      . calcium acetate (PHOSLO) capsule 2,001 mg  2,001 mg Oral TID WC [name], MD   2,001 mg at 04/03/18 1151  . Chlorhexidine Gluconate Cloth 2 % PADS 6 each  6 each Topical Q0600 [name], MD   6 each at 04/03/18 [phone]  . cinacalcet (SENSIPAR) tablet 90 mg  90 mg Oral Q T,Th,Sa-HD [name], MD   90 mg at 04/03/18 1151  . FLUoxetine (PROZAC) capsule 20 mg  20 mg Oral Daily [name], MD   20 mg at 04/03/18 0758  . heparin injection 1,000 Units  1,000 Units Dialysis PRN Alric Seton, PA-C      . heparin injection 1,700 Units  20 Units/kg Dialysis PRN Alric Seton, PA-C      . heparin injection 4,300 Units  50 Units/kg Dialysis PRN Kolluru, Sarath, MD      . insulin aspart (novoLOG) injection 0-5 Units  0-5 Units Subcutaneous TID WC Beaumont Austad, [name], MD   5 Units at 04/03/18 1200  . insulin  aspart (novoLOG) injection 6 Units  6 Units Subcutaneous TID WC Sahand Gosch, [name], MD   6 Units at 04/03/18 1200  . insulin glargine (LANTUS) injection 12 Units  12 Units Subcutaneous BID Saundra Shelling, MD   12 Units at 04/03/18 0800  . isosorbide-hydrALAZINE (BIDIL) 20-37.5 MG per tablet 1 tablet  1 tablet Oral TID [name], MD   1 tablet at 04/03/18 1151  . levETIRAcetam (KEPPRA) tablet 250 mg  250 mg Oral Q T,Th,Sat-1800 [name], MD   250 mg at 03/31/18 1732  . levETIRAcetam (KEPPRA) tablet 500 mg  500 mg Oral Q0600 [name], MD   500 mg at 04/03/18 [phone]  . lidocaine (PF) (XYLOCAINE) 1 % injection 5 mL  5 mL Intradermal PRN Alric Seton, PA-C      . lidocaine-prilocaine (EMLA) cream 1 application  1 application Topical PRN Alric Seton, PA-C      . minoxidil (LONITEN) tablet 2.5 mg  2.5 mg Oral q1800 Kolluru, Sarath, MD   2.5 mg at 04/02/18 1636  . multivitamin (RENA-VIT) tablet 1 tablet  1 tablet Oral QHS [name], MD   1 tablet at 04/02/18 2143  . NIFEdipine (PROCARDIA-XL/NIFEDICAL-XL) 24 hr tablet 30 mg  30 mg Oral BID [name], MD   30 mg at 04/03/18 0758  . pentafluoroprop-tetrafluoroeth (GEBAUERS) aerosol 1 application  1 application Topical PRN Alric Seton, PA-C      . risperiDONE (RISPERDAL M-TABS) disintegrating tablet 0.5 mg  0.5 mg Oral BID Sava Proby, [name], MD   0.5 mg at 04/03/18 0630    Lab Results:  Results for orders placed or performed during the hospital encounter of 03/28/18 (from the past 48 hour(s))  Glucose, capillary     Status: Abnormal   Collection Time: 04/01/18  4:59 PM  Result Value Ref Range   Glucose-Capillary 34 (LL) 70 - 99 mg/dL   Comment 1 Notify RN   Glucose, capillary     Status: Abnormal   Collection Time: 04/01/18  5:21 PM  Result Value Ref Range   Glucose-Capillary 46 (L) 70 - 99 mg/dL  Glucose, capillary     Status: None   Collection Time: 04/01/18  5:37 PM  Result Value Ref Range    Glucose-Capillary 96 70 - 99 mg/dL  Glucose, capillary     Status: Abnormal   Collection Time: 04/01/18  8:48 PM  Result Value Ref Range   Glucose-Capillary 224 (H) 70 - 99 mg/dL   Comment 1 Notify RN   Glucose, capillary     Status: Abnormal   Collection Time: 04/02/18  7:12 AM  Result Value Ref Range   Glucose-Capillary 167 (H) 70 - 99 mg/dL   Comment 1 Notify RN   CBC     Status: Abnormal   Collection Time: 04/02/18  7:49 AM  Result Value Ref Range   WBC 4.5 4.0 - 10.5 K/uL   RBC 3.56 (L) 4.22 - 5.81 MIL/uL   Hemoglobin 10.2 (L) 13.0 - 17.0 g/dL   HCT 30.3 (L) 39.0 - 52.0 %   MCV 85.1 80.0 - 100.0 fL   MCH 28.7 26.0 - 34.0 pg   MCHC 33.7 30.0 - 36.0 g/dL   RDW 12.7 11.5 - 15.5 %   Platelets 380 150 - 400 K/uL   nRBC 0.0 0.0 - 0.2 %    Comment: Performed at [facility], [address] [place], [place] [address]  Renal function panel     Status: Abnormal   Collection Time: 04/02/18  7:49 AM  Result Value Ref Range   Sodium 134 (L) 135 - 145 mmol/L   Potassium 6.0 (H) 3.5 - 5.1 mmol/L   Chloride 94 (L) 98 - 111 mmol/L   CO2 25 22 - 32 mmol/L   Glucose, Bld 186 (H)  70 - 99 mg/dL   BUN 89 (H) 6 - 20 mg/dL   Creatinine, Ser 13.81 (H) 0.61 - 1.24 mg/dL   Calcium 9.1 8.9 - 10.3 mg/dL   Phosphorus 4.6 2.5 - 4.6 mg/dL   Albumin 3.6 3.5 - 5.0 g/dL   GFR calc non Af Amer 4 (L) >60 mL/min   GFR calc Af Amer 5 (L) >60 mL/min   Anion gap 15 5 - 15    Comment: Performed at [facility], [address] [place], [place] [address]  Glucose, capillary     Status: Abnormal   Collection Time: 04/02/18 12:57 PM  Result Value Ref Range   Glucose-Capillary 167 (H) 70 - 99 mg/dL   Comment 1 Notify RN   Glucose, capillary     Status: Abnormal   Collection Time: 04/02/18  4:06 PM  Result Value Ref Range   Glucose-Capillary 105 (H) 70 - 99 mg/dL  Glucose, capillary     Status: Abnormal   Collection Time: 04/02/18 11:16 PM  Result Value Ref Range   Glucose-Capillary  139 (H) 70 - 99 mg/dL   Comment 1 Notify RN   Glucose, capillary     Status: Abnormal   Collection Time: 04/03/18  7:13 AM  Result Value Ref Range   Glucose-Capillary 32 (LL) 70 - 99 mg/dL   Comment 1 Notify RN   Glucose, capillary     Status: Abnormal   Collection Time: 04/03/18  7:58 AM  Result Value Ref Range   Glucose-Capillary 144 (H) 70 - 99 mg/dL   Comment 1 Notify RN   Potassium     Status: Abnormal   Collection Time: 04/03/18 10:16 AM  Result Value Ref Range   Potassium 6.9 (HH) 3.5 - 5.1 mmol/L    Comment: CRITICAL RESULT CALLED TO, READ BACK BY AND VERIFIED WITH SHAY POWELL AT 1050 04/03/2018/[facility] Performed at [facility], [address] [place], [place] [address]   Glucose, capillary     Status: Abnormal   Collection Time: 04/03/18 11:38 AM  Result Value Ref Range   Glucose-Capillary 390 (H) 70 - 99 mg/dL   Comment 1 Notify RN   Renal function panel     Status: Abnormal   Collection Time: 04/03/18  1:53 PM  Result Value Ref Range   Sodium 133 (L) 135 - 145 mmol/L   Potassium 5.6 (H) 3.5 - 5.1 mmol/L   Chloride 92 (L) 98 - 111 mmol/L   CO2 29 22 - 32 mmol/L   Glucose, Bld 260 (H) 70 - 99 mg/dL   BUN 67 (H) 6 - 20 mg/dL   Creatinine, Ser 12.91 (H) 0.61 - 1.24 mg/dL   Calcium 8.5 (L) 8.9 - 10.3 mg/dL   Phosphorus 4.5 2.5 - 4.6 mg/dL   Albumin 3.4 (L) 3.5 - 5.0 g/dL   GFR calc non Af Amer 4 (L) >60 mL/min   GFR calc Af Amer 5 (L) >60 mL/min   Anion gap 12 5 - 15    Comment: Performed at [facility], [address] [place], [place] [address]    Blood Alcohol level:  Lab Results  Component Value Date   [facility] <10 03/12/2018   ETH <10 37/62/8315    Metabolic Disorder Labs: Lab Results  Component Value Date   HGBA1C 9.1 (H) 02/13/2018   MPG 214.47 02/13/2018   MPG 249 08/31/2014   No results found for: PROLACTIN Lab Results  Component Value Date   TRIG 221 (H) 02/10/2018  Physical Findings: AIMS:  , ,  ,  ,    CIWA:     COWS:     Musculoskeletal: Strength & Muscle Tone: within normal limits Gait & Station: normal Patient leans: N/A  Psychiatric Specialty Exam: Physical Exam  Nursing note and vitals reviewed. Constitutional: He appears well-developed and well-nourished.  HENT:  Head: Normocephalic and atraumatic.  Eyes: Pupils are equal, round, and reactive to light. Conjunctivae are normal.  Neck: Normal range of motion.  Cardiovascular: Regular rhythm and normal heart sounds.  Respiratory: Effort normal.  GI: Soft.  Musculoskeletal: Normal range of motion.  Neurological: He is alert.  Skin: Skin is warm and dry.  Psychiatric: His speech is delayed. He is slowed and withdrawn. Thought content is paranoid and delusional. Cognition and memory are impaired. He expresses impulsivity. He exhibits a depressed mood. He expresses no suicidal ideation. He is noncommunicative. He exhibits abnormal recent memory and abnormal remote memory.    Review of Systems  Constitutional: Positive for malaise/fatigue and weight loss.  HENT: Negative.   Eyes: Negative.   Respiratory: Negative.   Cardiovascular: Negative.   Gastrointestinal: Negative.   Musculoskeletal: Negative.   Skin: Negative.   Neurological: Negative.   Psychiatric/Behavioral: Positive for depression and memory loss. Negative for hallucinations, substance abuse and suicidal ideas. The patient is nervous/anxious. The patient does not have insomnia.     Blood pressure (!) 159/78, pulse (!) 107, temperature 98.2 F (36.8 C), temperature source Oral, resp. rate 16, height 6' (1.829 m), weight 87.5 kg, SpO2 99 %.Body mass index is 26.16 kg/m.  General Appearance: Casual  Eye Contact:  Fair  Speech:  Slow  Volume:  Decreased  Mood:  Depressed  Affect:  Congruent  Thought Process:  Coherent  Orientation:  Full (Time, Place, and Person)  Thought Content:  Illogical, Delusions and Paranoid Ideation  Suicidal Thoughts:  No  Homicidal Thoughts:   No  Memory:  Immediate;   Fair Recent;   Poor Remote;   Poor  Judgement:  Impaired  Insight:  Shallow  Psychomotor Activity:  Decreased  Concentration:  Concentration: Fair  Recall:  [employer] of Knowledge:  Fair  Language:  Fair  Akathisia:  No  Handed:  Right  AIMS (if indicated):     Assets:  Desire for Improvement Resilience Social Support  ADL's:  Impaired  Cognition:  Impaired,  Mild  Sleep:  Number of Hours: 7.75     Treatment Plan Summary: Daily contact with patient to assess and evaluate symptoms and progress in treatment, Medication management and Plan It was very good news to contact his father today and find out that he actually has support.  Patient gave consent for his father to have the password and for [place] to communicate with him.  Patient seems a little less agitated than he was when talking about his food yesterday but still very depressed and withdrawn.  Reviewed medicine.  Still on antidepressant and now low-dose antipsychotic.  We will continue to follow and might consider recommending ECT sooner rather than later if he continues to be as withdrawn as he is.  Alethia Berthold, MD 04/03/2018, 4:40 PM

## 2018-04-03 NOTE — B[facility] Group Notes (Signed)
  LCSW Group Therapy Note  04/03/2018 2:24 PM   Type of Therapy/Topic:  Group Therapy:  Feelings about Diagnosis  Participation Level:  Did Not Attend. Pt invited, chose not to attend.   Description of Group:   This group will allow patients to explore their thoughts and feelings about diagnoses they have received. Patients will be guided to explore their level of understanding and acceptance of these diagnoses. Facilitator will encourage patients to process their thoughts and feelings about the reactions of others to their diagnosis and will guide patients in identifying ways to discuss their diagnosis with significant others in their lives. This group will be process-oriented, with patients participating in exploration of their own experiences, giving and receiving support, and processing challenge from other group members.   Therapeutic Goals: 1. Patient will demonstrate understanding of diagnosis as evidenced by identifying two or more symptoms of the disorder 2. Patient will be able to express two feelings regarding the diagnosis 3. Patient will demonstrate their ability to communicate their needs through discussion and/or role play  Summary of Patient Progress: x   Therapeutic Modalities:   Cognitive Behavioral Therapy Brief Therapy Feelings Identification    Evalina Field, MSW, LCSW Clinical Social Work 04/03/2018 2:24 PM

## 2018-04-03 NOTE — Progress Notes (Signed)
Pre hd 

## 2018-04-03 NOTE — Progress Notes (Signed)
HD initiated via L AVG using 15g needles x2. Renal panel sent to confirm results. 1k bath as ordered. No uf. Will monitor.

## 2018-04-03 NOTE — Plan of Care (Signed)
Patient is alert and oriented today, remembered RN from last week. Patient remains very depressed, sullen and flat.  Patient blood glucose was low this morning 32, but went up to 144 after eating breakfast. Patient received long acting Lantus this morning. Patient initially refused lab draw but with RN encouragement patient allowed blood to be drawn. Patient denies SI, HI and AVH. Awaiting lab results. Safety rounds Q 15 minutes. Problem: Safety: Goal: Periods of time without injury will increase Outcome: Not Progressing   Problem: Education: Goal: Ability to make informed decisions regarding treatment will improve Outcome: Not Progressing   Problem: Self-Concept: Goal: Ability to disclose and discuss suicidal ideas will improve Outcome: Not Progressing Goal: Will verbalize positive feelings about self Outcome: Not Progressing   Problem: Education: Goal: Utilization of techniques to improve thought processes will improve Outcome: Not Progressing Goal: Knowledge of the prescribed therapeutic regimen will improve Outcome: Not Progressing

## 2018-04-03 NOTE — Progress Notes (Signed)
Inpatient Diabetes Program Recommendations  AACE/ADA: New Consensus Statement on Inpatient Glycemic Control (2015)  Target Ranges:  Prepandial:   less than 140 mg/dL      Peak postprandial:   less than 180 mg/dL (1-2 hours)      Critically ill patients:  140 - 180 mg/dL   Results for HARVIN, KONICEK (MRN [id number]) as of 04/03/2018 09:03  Ref. Range 04/01/2018 07:19 04/01/2018 11:39 04/01/2018 16:59 04/01/2018 17:21 04/01/2018 17:37 04/01/2018 20:48  Glucose-Capillary Latest Ref Range: 70 - 99 mg/dL 113 (H)  8 units NOVOLOG +  12 units LANTUS  83  8 units NOVOLOG  34 (LL) 46 (L) 96 224 (H)   Results for KEMET, NIJJAR (MRN [id number]) as of 04/03/2018 09:03  Ref. Range 04/02/2018 07:12 04/02/2018 12:57 04/02/2018 16:06 04/02/2018 23:16  Glucose-Capillary Latest Ref Range: 70 - 99 mg/dL 167 (H) 167 (H)    12 units LANTUS 105 (H)    12 units LANTUS 139 (H)   Results for CORTLANDT, CAPUANO (MRN [id number]) as of 04/03/2018 09:03  Ref. Range 04/03/2018 07:13  Glucose-Capillary Latest Ref Range: 70 - 99 mg/dL 32 (LL)    Home DM Meds: Lantus 14 units Daily/ 8 units QHS                             Novolog 0-9 units TID with meals  Current Orders: Lantus 12 units BID                            Novolog 0-5 units TID with meals                            Novolog 6 units TID with meals    Hypoglycemic 01/26 at 5pm after receiving 8 units Novolog Meal Coverage at 11:30am.  Novolog Meal Coverage reduced to 6 units TID with meals yesterday afternoon.  Note that patient refusing to eat at times b/c he thinks someone is pooping and spitting in his food.  Ate his dinner tray last night b/c the RN got a sealed tray for him.  Hypoglycemic again this AM (CBG down to 32 mg/dl).    MD- Please reduce Lantus to 9 units BID (20% reduction of dose)    --Will follow patient during hospitalization--  Wyn Quaker RN, MSN, CDE Diabetes Coordinator Inpatient  Glycemic Control Team Team Pager: [phone] (8a-5p)

## 2018-04-03 NOTE — Progress Notes (Signed)
Patient is alert and oriented and talking. Patient fsbs is 32 this morning. Patient given a snack to eat and drink. Will recheck in 15 minutes. Also will report to oncoming nurse.

## 2018-04-03 NOTE — Progress Notes (Signed)
Received critical lab value of Potassium 6.9. RN informed Psychiatrists.

## 2018-04-03 NOTE — Plan of Care (Signed)
Pt Denies any suicide ideation and contracts for safety. Pt Compliance with all medications.  Pt still withdrawn with flat affect.   Problem: Safety: Goal: Periods of time without injury will increase Outcome: Progressing   Problem: Education: Goal: Ability to make informed decisions regarding treatment will improve Outcome: Progressing   Problem: Self-Concept: Goal: Ability to disclose and discuss suicidal ideas will improve Outcome: Progressing   Problem: Education: Goal: Utilization of techniques to improve thought processes will improve Outcome: Not Progressing Goal: Knowledge of the prescribed therapeutic regimen will improve Outcome: Not Progressing   Problem: Safety: Goal: Ability to disclose and discuss suicidal ideas will improve Outcome: Not Progressing Goal: Ability to identify and utilize support systems that promote safety will improve Outcome: Not Progressing

## 2018-04-03 NOTE — Progress Notes (Signed)
Recreation Therapy Notes   Date: 04/03/2018  Time: 9:30 am   Location: Craft room   Behavioral response: N/A   Intervention Topic: Communication  Discussion/Intervention: Patient did not attend group.   Clinical Observations/Feedback:  Patient did not attend group.   Farren Nelles LRT/CTRS        Haeley Fordham 04/03/2018 11:05 AM

## 2018-04-04 LAB — GLUCOSE, CAPILLARY
GLUCOSE-CAPILLARY: 105 mg/dL — AB (ref 70–99)
Glucose-Capillary: 58 mg/dL — ABNORMAL LOW (ref 70–99)
Glucose-Capillary: 60 mg/dL — ABNORMAL LOW (ref 70–99)
Glucose-Capillary: 63 mg/dL — ABNORMAL LOW (ref 70–99)
Glucose-Capillary: 111 mg/dL — ABNORMAL HIGH (ref 70–99)
Glucose-Capillary: 147 mg/dL — ABNORMAL HIGH (ref 70–99)

## 2018-04-04 MED ORDER — INSULIN GLARGINE 100 UNIT/ML ~~LOC~~ SOLN
10.0000 [IU] | Freq: Two times a day (BID) | SUBCUTANEOUS | Status: DC
  Filled 2018-04-04 (×2): qty 0.1

## 2018-04-04 MED ORDER — INSULIN GLARGINE 100 UNIT/ML ~~LOC~~ SOLN
10.0000 [IU] | Freq: Two times a day (BID) | SUBCUTANEOUS | Status: DC
  Administered 2018-04-04 – 2018-04-10 (×12): 10 [IU] via SUBCUTANEOUS
  Filled 2018-04-04 (×15): qty 0.1

## 2018-04-04 NOTE — Tx Team (Signed)
Interdisciplinary Treatment and Diagnostic Plan Update  04/04/2018 Time of Session:  8:30am Edward Colon MRN: [id number]  Principal Diagnosis: MDD (major depressive disorder), recurrent severe, without psychosis ([place])  Secondary Diagnoses: Principal Problem:   MDD (major depressive disorder), recurrent severe, without psychosis ([place]) Active Problems:   Seizures ([place])   Hypertension   Type 1 diabetes ([place])   Acute on chronic renal failure ([place])   Current Medications:  Current Facility-Administered Medications  Medication Dose Route Frequency Provider Last Rate Last Dose  . 0.9 %  sodium chloride infusion  100 mL Intravenous PRN Alric Seton, PA-C      . 0.9 %  sodium chloride infusion  100 mL Intravenous PRN Alric Seton, PA-C      . acetaminophen (TYLENOL) tablet 650 mg  650 mg Oral Q4H PRN [name], MD      . alteplase (CATHFLO ACTIVASE) injection 2 mg  2 mg Intracatheter Once PRN Alric Seton, PA-C      . alum & mag hydroxide-simeth (MAALOX/MYLANTA) 200-200-20 MG/5ML suspension 30 mL  30 mL Oral Q4H PRN [name], MD      . calcium acetate (PHOSLO) capsule 2,001 mg  2,001 mg Oral TID WC [name], MD   2,001 mg at 04/04/18 0900  . Chlorhexidine Gluconate Cloth 2 % PADS 6 each  6 each Topical Q0600 [name], MD   6 each at 04/03/18 [phone]  . cinacalcet (SENSIPAR) tablet 90 mg  90 mg Oral Q T,Th,Sa-HD [name], MD   90 mg at 04/03/18 1151  . FLUoxetine (PROZAC) capsule 20 mg  20 mg Oral Daily [name], MD   20 mg at 04/04/18 0900  . heparin injection 1,000 Units  1,000 Units Dialysis PRN Alric Seton, PA-C      . heparin injection 1,700 Units  20 Units/kg Dialysis PRN Alric Seton, PA-C      . heparin injection 4,300 Units  50 Units/kg Dialysis PRN Kolluru, Sarath, MD      . insulin aspart (novoLOG) injection 0-5 Units  0-5 Units Subcutaneous TID WC [name], [name], MD   1 Units at 04/03/18 1754  . insulin aspart  (novoLOG) injection 6 Units  6 Units Subcutaneous TID WC [name], [name], MD   6 Units at 04/03/18 1755  . insulin glargine (LANTUS) injection 10 Units  10 Units Subcutaneous BID [name], [name], MD      . isosorbide-hydrALAZINE (BIDIL) 20-37.5 MG per tablet 1 tablet  1 tablet Oral TID [name], MD   1 tablet at 04/04/18 0900  . levETIRAcetam (KEPPRA) tablet 250 mg  250 mg Oral Q T,Th,Sat-1800 [name], MD   250 mg at 04/03/18 1752  . levETIRAcetam (KEPPRA) tablet 500 mg  500 mg Oral Q0600 [name], MD   500 mg at 04/04/18 1017  . lidocaine (PF) (XYLOCAINE) 1 % injection 5 mL  5 mL Intradermal PRN Alric Seton, PA-C      . lidocaine-prilocaine (EMLA) cream 1 application  1 application Topical PRN Alric Seton, PA-C      . minoxidil (LONITEN) tablet 2.5 mg  2.5 mg Oral q1800 Kolluru, Sarath, MD   2.5 mg at 04/03/18 1752  . multivitamin (RENA-VIT) tablet 1 tablet  1 tablet Oral QHS [name], MD   1 tablet at 04/03/18 2050  . NIFEdipine (PROCARDIA-XL/NIFEDICAL-XL) 24 hr tablet 30 mg  30 mg Oral BID [name], MD   30 mg  at 04/04/18 0900  . pentafluoroprop-tetrafluoroeth (GEBAUERS) aerosol 1 application  1 application Topical PRN Alric Seton, PA-C      . risperiDONE (RISPERDAL M-TABS) disintegrating tablet 0.5 mg  0.5 mg Oral BID [name], [name], MD   0.5 mg at 04/04/18 0900   PTA Medications: Medications Prior to Admission  Medication Sig Dispense Refill Last Dose  . calcium acetate (PHOSLO) 667 MG capsule Take 1 capsule (667 mg total) by mouth 3 (three) times daily with meals. (Patient taking differently: Take [phone] mg by mouth See admin instructions. Take 4 capsules (2668 mg) by mouth with meals and 2 capsules (1334 mg) with snacks) 90 capsule 0 03/11/2018 at Unknown time  . FLUoxetine (PROZAC) 20 MG capsule Take 1 capsule (20 mg total) by mouth daily.  3   . insulin aspart (NOVOLOG) 100 UNIT/ML injection Inject 0-9 Units into the skin 3 (three)  times daily with meals for 30 days. For glucose 121 to 150 use one unit, for 151 to 200 use two units, for 201 to 250 use three units, for 251 to 300 use five units, for 301 to 350 use seven units, for 351 or greater use 9 units. 8.1 mL 0   . insulin glargine (LANTUS) 100 UNIT/ML injection Inject 0.14 mLs (14 Units total) into the skin every morning. 10 mL 11   . insulin glargine (LANTUS) 100 UNIT/ML injection Inject 0.08 mLs (8 Units total) into the skin at bedtime. 10 mL 11   . isosorbide-hydrALAZINE (BIDIL) 20-37.5 MG tablet Take 1 tablet by mouth 3 (three) times daily. 90 tablet 0   . levETIRAcetam (KEPPRA) 250 MG tablet Take 1 tablet (250 mg total) by mouth every Tuesday, Thursday, and Saturday at 6 PM for 30 days. 12 tablet 0   . levETIRAcetam (KEPPRA) 500 MG tablet Take 1 tablet (500 mg total) by mouth daily at 6 (six) AM for 30 days. 30 tablet 0   . minoxidil (LONITEN) 2.5 MG tablet Take 2.5 mg by mouth 2 (two) times daily.     . multivitamin (RENA-VIT) TABS tablet Take 1 tablet by mouth at bedtime. (Patient taking differently: Take 1 tablet by mouth daily. ) 30 tablet 0 02/08/2018 at Unknown time  . NIFEdipine (ADALAT CC) 30 MG 24 hr tablet Take 1 tablet (30 mg total) by mouth 2 (two) times daily. 60 tablet 0     Patient Stressors: Health problems Medication change or noncompliance  Patient Strengths: Ability for insight Motivation for treatment/growth  Treatment Modalities: Medication Management, Group therapy, Case management,  1 to 1 session with clinician, Psychoeducation, Recreational therapy.   Physician Treatment Plan for Primary Diagnosis: MDD (major depressive disorder), recurrent severe, without psychosis ([place]) Long Term Goal(s): Improvement in symptoms so as ready for discharge Improvement in symptoms so as ready for discharge   Short Term Goals: Ability to disclose and discuss suicidal ideas Ability to identify and develop effective coping behaviors will  improve  Medication Management: Evaluate patient's response, side effects, and tolerance of medication regimen.  Therapeutic Interventions: 1 to 1 sessions, Unit Group sessions and Medication administration.  Evaluation of Outcomes: Not Progressing  Physician Treatment Plan for Secondary Diagnosis: Principal Problem:   MDD (major depressive disorder), recurrent severe, without psychosis ([place]) Active Problems:   Seizures ([place])   Hypertension   Type 1 diabetes ([facility])   Acute on chronic renal failure ([facility])  Long Term Goal(s): Improvement in symptoms so as ready for discharge Improvement in symptoms so as ready for discharge  Short Term Goals: Ability to disclose and discuss suicidal ideas Ability to identify and develop effective coping behaviors will improve     Medication Management: Evaluate patient's response, side effects, and tolerance of medication regimen.  Therapeutic Interventions: 1 to 1 sessions, Unit Group sessions and Medication administration.  Evaluation of Outcomes: Not Progressing   RN Treatment Plan for Primary Diagnosis: MDD (major depressive disorder), recurrent severe, without psychosis ([place]) Long Term Goal(s): Knowledge of disease and therapeutic regimen to maintain health will improve  Short Term Goals: Ability to demonstrate self-control, Ability to verbalize feelings will improve, Ability to identify and develop effective coping behaviors will improve and Compliance with prescribed medications will improve  Medication Management: RN will administer medications as ordered by provider, will assess and evaluate patient's response and provide education to patient for prescribed medication. RN will report any adverse and/or side effects to prescribing provider.  Therapeutic Interventions: 1 on 1 counseling sessions, Psychoeducation, Medication administration, Evaluate responses to treatment, Monitor vital signs and CBGs as ordered, Perform/monitor CIWA, COWS,  AIMS and Fall Risk screenings as ordered, Perform wound care treatments as ordered.  Evaluation of Outcomes: Not Progressing   LCSW Treatment Plan for Primary Diagnosis: MDD (major depressive disorder), recurrent severe, without psychosis ([place]) Long Term Goal(s): Safe transition to appropriate next level of care at discharge, Engage patient in therapeutic group addressing interpersonal concerns.  Short Term Goals: Engage patient in aftercare planning with referrals and resources, Increase social support, Increase ability to appropriately verbalize feelings, Increase emotional regulation and Increase skills for wellness and recovery  Therapeutic Interventions: Assess for all discharge needs, 1 to 1 time with Social worker, Explore available resources and support systems, Assess for adequacy in community support network, Educate family and significant other(s) on suicide prevention, Complete Psychosocial Assessment, Interpersonal group therapy.  Evaluation of Outcomes: Not Progressing   Progress in Treatment: Attending groups: No. Participating in groups: No. Taking medication as prescribed: Yes. Toleration medication: Yes. Family/Significant other contact made: Yes, individual(s) contacted:  SPE completed with pt. He declined family contact.  Physician notes that he has spoken with patient's father, however, CSW has not. Patient understands diagnosis: Yes. Discussing patient identified problems/goals with staff: Yes. Medical problems stabilized or resolved: Yes. Denies suicidal/homicidal ideation: Yes. Issues/concerns per patient self-inventory: No. Other: none  New problem(s) identified: No, Describe:  none  New Short Term/Long Term Goal(s): medication management for mood stabilization; development of comprehensive mental wellness/sobriety plan.  Patient Goals:  "I really don't know".  Discharge Plan or Barriers: Pt reports no barriers.  Patient has declined referrals at this time.   CSW will check again prior to the patient's discharge.   Reason for Continuation of Hospitalization: Depression Medical Issues Medication stabilization  Estimated Length of Stay: 3-5 days  Recreational Therapy: Patient Stressors: N/A  Patient Goal: Patient will engage in groups without prompting or encouragement from LRT x3 group sessions within 5 recreation therapy group sessions  Attendees: Patient: Edward Colon 04/04/2018 9:12 AM  Physician: Dr. Weber Cooks, MD 04/04/2018 9:12 AM  Nursing:  04/04/2018 9:12 AM  RN Care Manager: 04/04/2018 9:12 AM  Social Worker: Assunta Curtis, LCSW 04/04/2018 9:12 AM  Recreational Therapist:  04/04/2018 9:12 AM  Other:  04/04/2018 9:12 AM  Other:  04/04/2018 9:12 AM  Other: 04/04/2018 9:12 AM    Scribe for Treatment Team: Rozann Lesches, LCSW 04/04/2018 9:12 AM

## 2018-04-04 NOTE — Plan of Care (Signed)
D: Pt denies SI/HI/AV hallucinations. Pt has a flat affect and is in a depressed mood. Patient will answer questions when asked. Patient engages minimal with this Probation officer. Patient is isolative to his room. Patient is also withdrawn. A: Pt was offered support and encouragement. Pt was given scheduled medications. Pt was encourage to attend groups. Q 15 minute checks were done for safety.  R:Pt does not attend groups and interacts with staff. Pt is taking medication. Pt has no complaints.Pt receptive to treatment and safety maintained on unit.    Problem: Safety: Goal: Periods of time without injury will increase Outcome: Progressing   Problem: Self-Concept: Goal: Ability to disclose and discuss suicidal ideas will improve Outcome: Progressing

## 2018-04-04 NOTE — [facility] Group Notes (Signed)
LCSW Group Therapy Note  04/04/2018 1:50 PM  Type of Therapy/Topic:  Group Therapy:  Emotion Regulation  Participation Level:  Did Not Attend. Pt invited, chose not to attend.   Description of Group:   The purpose of this group is to assist patients in learning to regulate negative emotions and experience positive emotions. Patients will be guided to discuss ways in which they have been vulnerable to their negative emotions. These vulnerabilities will be juxtaposed with experiences of positive emotions or situations, and patients will be challenged to use positive emotions to combat negative ones. Special emphasis will be placed on coping with negative emotions in conflict situations, and patients will process healthy conflict resolution skills.  Therapeutic Goals: 1. Patient will identify two positive emotions or experiences to reflect on in order to balance out negative emotions 2. Patient will label two or more emotions that they find the most difficult to experience 3. Patient will demonstrate positive conflict resolution skills through discussion and/or role plays  Summary of Patient Progress: x   Therapeutic Modalities:   Cognitive Behavioral Therapy Feelings Identification Dialectical Behavioral Therapy   Evalina Field, MSW, LCSW Clinical Social Work 04/04/2018 1:50 PM

## 2018-04-04 NOTE — Progress Notes (Signed)
[facility] MD Progress Note  04/04/2018 4:31 PM Edward Colon  MRN:  [id number] Subjective: Follow-up for this patient with major depression.  Patient is slow down at times almost to the point of catatonia.  He took a shower today and flooded his room by standing on the drain in the shower and did not seem to even notice it or move until the entire room was flooded.  On interview he is flat withdrawn.  Makes eye contact.  Nods and seems to show some understanding of conversation.  Blood sugars still a little out of control.  Appreciate assistance from diabetes coordinator.  Patient looks to me like a good candidate for ECT. Principal Problem: MDD (major depressive disorder), recurrent severe, without psychosis ([place]) Diagnosis: Principal Problem:   MDD (major depressive disorder), recurrent severe, without psychosis ([place]) Active Problems:   Seizures ([place])   Hypertension   Type 1 diabetes ([place])   Acute on chronic renal failure ([employer])  Total Time spent with patient: 30 minutes  Past Psychiatric History: Depression which apparently has been gradually getting worse over the past year or 2  Past Medical History:  Past Medical History:  Diagnosis Date  . Anemia   . Blind left eye since ~ 2010  . Depression   . Diabetic peripheral neuropathy ([place])   . ESRD (end stage renal disease) on dialysis [facility])    "TTS; [place] [facility]; [facility]" (02/01/2016)  . Heart murmur    denies any problems with it  . Hypertension   . Seizures ([place])    "last one was end of 2016; they are related to my diabetes" (02/01/2016)  . Type 1 diabetes ([place]) dx'd 1990    Past Surgical History:  Procedure Laterality Date  . AV FISTULA PLACEMENT Right 03/03/2015   Procedure: RIGHT RADIO-CEPHALIC ARTERIOVENOUS (AV) FISTULA CREATION;  Surgeon: Serafina Mitchell, MD;  Location: [facility] OR;  Service: Vascular;  Laterality: Right;  . AV FISTULA PLACEMENT Left 06/15/2015   Procedure: INSERTION OF LEFT UPPER ARM  ARTERIOVENOUS (AV) 57mm  x 50cm GORE-TEX GRAFT;  Surgeon: Elam Dutch, MD;  Location: [place];  Service: Vascular;  Laterality: Left;  . BASCILIC VEIN TRANSPOSITION Left 04/01/2015   Procedure: BASCILIC VEIN TRANSPOSITION-LEFT ARM- FIRST STAGE;  Surgeon: Elam Dutch, MD;  Location: [place];  Service: Vascular;  Laterality: Left;  . EYE SURGERY Right ~ 2010   for diabetic retinopathy  . INSERTION OF DIALYSIS CATHETER Right 03/03/2015   Procedure: INSERTION OF DIALYSIS CATHETER;  Surgeon: Serafina Mitchell, MD;  Location: [facility] OR;  Service: Vascular;  Laterality: Right;   Family History:  Family History  Problem Relation Age of Onset  . Diabetes Neg Hx    Family Psychiatric  History: None known Social History:  Social History   Substance and Sexual Activity  Alcohol Use No  . Frequency: Never   Comment: 02/01/2016 "might have 1 drink/year"     Social History   Substance and Sexual Activity  Drug Use No    Social History   Socioeconomic History  . Marital status: Single    Spouse name: Not on file  . Number of children: Not on file  . Years of education: Not on file  . Highest education level: Not on file  Occupational History  . Not on file  Social Needs  . Financial resource strain: Not on file  . Food insecurity:    Worry: Not on file    Inability: Not on file  . Transportation  needs:    Medical: Not on file    Non-medical: Not on file  Tobacco Use  . Smoking status: Never Smoker  . Smokeless tobacco: Never Used  Substance and Sexual Activity  . Alcohol use: No    Frequency: Never    Comment: 02/01/2016 "might have 1 drink/year"  . Drug use: No  . Sexual activity: Yes  Lifestyle  . Physical activity:    Days per week: Not on file    Minutes per session: Not on file  . Stress: Not on file  Relationships  . Social connections:    Talks on phone: Not on file    Gets together: Not on file    Attends religious service: Not on file    Active member of club or organization: Not on  file    Attends meetings of clubs or organizations: Not on file    Relationship status: Not on file  Other Topics Concern  . Not on file  Social History Narrative  . Not on file   Additional Social History:                         Sleep: Fair  Appetite:  Poor  Current Medications: Current Facility-Administered Medications  Medication Dose Route Frequency Provider Last Rate Last Dose  . 0.9 %  sodium chloride infusion  100 mL Intravenous PRN Alric Seton, PA-C      . 0.9 %  sodium chloride infusion  100 mL Intravenous PRN Alric Seton, PA-C      . acetaminophen (TYLENOL) tablet 650 mg  650 mg Oral Q4H PRN [name], MD      . alteplase (CATHFLO ACTIVASE) injection 2 mg  2 mg Intracatheter Once PRN Alric Seton, PA-C      . alum & mag hydroxide-simeth (MAALOX/MYLANTA) 200-200-20 MG/5ML suspension 30 mL  30 mL Oral Q4H PRN [name], MD      . calcium acetate (PHOSLO) capsule 2,001 mg  2,001 mg Oral TID WC [name], MD   2,001 mg at 04/04/18 1629  . Chlorhexidine Gluconate Cloth 2 % PADS 6 each  6 each Topical Q0600 [name], MD   6 each at 04/03/18 [phone]  . cinacalcet (SENSIPAR) tablet 90 mg  90 mg Oral Q T,Th,Sa-HD [name], MD   90 mg at 04/03/18 1151  . FLUoxetine (PROZAC) capsule 20 mg  20 mg Oral Daily [name], MD   20 mg at 04/04/18 0900  . heparin injection 1,000 Units  1,000 Units Dialysis PRN Alric Seton, PA-C      . heparin injection 1,700 Units  20 Units/kg Dialysis PRN Alric Seton, PA-C      . heparin injection 4,300 Units  50 Units/kg Dialysis PRN Kolluru, Sarath, MD      . insulin aspart (novoLOG) injection 0-5 Units  0-5 Units Subcutaneous TID WC , [name], MD   1 Units at 04/03/18 1754  . insulin aspart (novoLOG) injection 6 Units  6 Units Subcutaneous TID WC , [name], MD   6 Units at 04/04/18 1229  . insulin glargine (LANTUS) injection 10 Units  10 Units Subcutaneous BID , [name], MD   10 Units at 04/04/18 1034  . isosorbide-hydrALAZINE (BIDIL) 20-37.5 MG per tablet 1 tablet  1 tablet Oral TID [name], MD   1 tablet at 04/04/18 1630  . levETIRAcetam (KEPPRA) tablet 250 mg  250 mg Oral  Q Corrie Dandy, MD   250 mg at 04/03/18 1752  . levETIRAcetam (KEPPRA) tablet 500 mg  500 mg Oral Q0600 [name], MD   500 mg at 04/04/18 2671  . lidocaine (PF) (XYLOCAINE) 1 % injection 5 mL  5 mL Intradermal PRN Alric Seton, PA-C      . lidocaine-prilocaine (EMLA) cream 1 application  1 application Topical PRN Alric Seton, PA-C      . minoxidil (LONITEN) tablet 2.5 mg  2.5 mg Oral q1800 Kolluru, Sarath, MD   2.5 mg at 04/03/18 1752  . multivitamin (RENA-VIT) tablet 1 tablet  1 tablet Oral QHS [name], MD   1 tablet at 04/03/18 2050  . NIFEdipine (PROCARDIA-XL/NIFEDICAL-XL) 24 hr tablet 30 mg  30 mg Oral BID [name], MD   30 mg at 04/04/18 0900  . pentafluoroprop-tetrafluoroeth (GEBAUERS) aerosol 1 application  1 application Topical PRN Alric Seton, PA-C      . risperiDONE (RISPERDAL M-TABS) disintegrating tablet 0.5 mg  0.5 mg Oral BID ,  T, MD   0.5 mg at 04/04/18 0900    Lab Results:  Results for orders placed or performed during the hospital encounter of 03/28/18 (from the past 48 hour(s))  Glucose, capillary     Status: Abnormal   Collection Time: 04/02/18 11:16 PM  Result Value Ref Range   Glucose-Capillary 139 (H) 70 - 99 mg/dL   Comment 1 Notify RN   Glucose, capillary     Status: Abnormal   Collection Time: 04/03/18  7:13 AM  Result Value Ref Range   Glucose-Capillary 32 (LL) 70 - 99 mg/dL   Comment 1 Notify RN   Glucose, capillary     Status: Abnormal   Collection Time: 04/03/18  7:58 AM  Result Value Ref Range   Glucose-Capillary 144 (H) 70 - 99 mg/dL   Comment 1 Notify RN   Potassium     Status: Abnormal   Collection Time: 04/03/18 10:16 AM  Result Value Ref Range   Potassium 6.9 (HH) 3.5  - 5.1 mmol/L    Comment: CRITICAL RESULT CALLED TO, READ BACK BY AND VERIFIED WITH SHAY POWELL AT 1050 04/03/2018/[facility] Performed at [facility], [address] [place], [place] [address]   Glucose, capillary     Status: Abnormal   Collection Time: 04/03/18 11:38 AM  Result Value Ref Range   Glucose-Capillary 390 (H) 70 - 99 mg/dL   Comment 1 Notify RN   Renal function panel     Status: Abnormal   Collection Time: 04/03/18  1:53 PM  Result Value Ref Range   Sodium 133 (L) 135 - 145 mmol/L   Potassium 5.6 (H) 3.5 - 5.1 mmol/L   Chloride 92 (L) 98 - 111 mmol/L   CO2 29 22 - 32 mmol/L   Glucose, Bld 260 (H) 70 - 99 mg/dL   BUN 67 (H) 6 - 20 mg/dL   Creatinine, Ser 12.91 (H) 0.61 - 1.24 mg/dL   Calcium 8.5 (L) 8.9 - 10.3 mg/dL   Phosphorus 4.5 2.5 - 4.6 mg/dL   Albumin 3.4 (L) 3.5 - 5.0 g/dL   GFR calc non Af Amer 4 (L) >60 mL/min   GFR calc Af Amer 5 (L) >60 mL/min   Anion gap 12 5 - 15    Comment: Performed at [facility], [address] [place], [place] [address]  Glucose, capillary     Status: Abnormal   Collection Time: 04/03/18  5:47  PM  Result Value Ref Range   Glucose-Capillary 156 (H) 70 - 99 mg/dL  Glucose, capillary     Status: Abnormal   Collection Time: 04/03/18  8:45 PM  Result Value Ref Range   Glucose-Capillary 267 (H) 70 - 99 mg/dL   Comment 1 Notify RN   Glucose, capillary     Status: Abnormal   Collection Time: 04/04/18  6:55 AM  Result Value Ref Range   Glucose-Capillary 60 (L) 70 - 99 mg/dL   Comment 1 Notify RN   Glucose, capillary     Status: Abnormal   Collection Time: 04/04/18  7:20 AM  Result Value Ref Range   Glucose-Capillary 58 (L) 70 - 99 mg/dL  Glucose, capillary     Status: Abnormal   Collection Time: 04/04/18  7:22 AM  Result Value Ref Range   Glucose-Capillary 63 (L) 70 - 99 mg/dL  Glucose, capillary     Status: Abnormal   Collection Time: 04/04/18 12:01 PM  Result Value Ref Range   Glucose-Capillary 105 (H)  70 - 99 mg/dL    Blood Alcohol level:  Lab Results  Component Value Date   ETH <10 03/12/2018   ETH <10 58/85/0277    Metabolic Disorder Labs: Lab Results  Component Value Date   HGBA1C 9.1 (H) 02/13/2018   MPG 214.47 02/13/2018   MPG 249 08/31/2014   No results found for: PROLACTIN Lab Results  Component Value Date   TRIG 221 (H) 02/10/2018    Physical Findings: AIMS:  , ,  ,  ,    CIWA:    COWS:     Musculoskeletal: Strength & Muscle Tone: decreased Gait & Station: normal Patient leans: N/A  Psychiatric Specialty Exam: Physical Exam  Nursing note and vitals reviewed. Constitutional: He appears well-developed and well-nourished.  HENT:  Head: Normocephalic and atraumatic.  Eyes: Pupils are equal, round, and reactive to light. Conjunctivae are normal.  Neck: Normal range of motion.  Cardiovascular: Regular rhythm and normal heart sounds.  Respiratory: Effort normal. No respiratory distress.  GI: Soft.  Musculoskeletal: Normal range of motion.  Neurological: He is alert.  Skin: Skin is warm and dry.  Psychiatric: His affect is blunt. His speech is delayed. He is slowed and withdrawn. Thought content is paranoid. Cognition and memory are impaired.    Review of Systems  Constitutional: Positive for malaise/fatigue.  HENT: Negative.   Eyes: Negative.   Respiratory: Negative.   Cardiovascular: Negative.   Gastrointestinal: Negative.   Musculoskeletal: Negative.   Skin: Negative.   Neurological: Negative.   Psychiatric/Behavioral: Positive for depression and memory loss. Negative for hallucinations, substance abuse and suicidal ideas. The patient is nervous/anxious. The patient does not have insomnia.     Blood pressure (!) 157/88, pulse 96, temperature 98 F (36.7 C), temperature source Oral, resp. rate 16, height 6' (1.829 m), weight 87.5 kg, SpO2 100 %.Body mass index is 26.16 kg/m.  General Appearance: Guarded  Eye Contact:  Minimal  Speech:  Slow   Volume:  Decreased  Mood:  Depressed  Affect:  Flat  Thought Process:  Coherent  Orientation:  Full (Time, Place, and Person)  Thought Content:  Rumination  Suicidal Thoughts:  No  Homicidal Thoughts:  No  Memory:  Immediate;   Fair Recent;   Fair Remote;   Fair  Judgement:  Fair  Insight:  Shallow  Psychomotor Activity:  Decreased  Concentration:  Concentration: Poor  Recall:  Poor  Fund of Knowledge:  Fair  Language:  Fair  Akathisia:  No  Handed:  Right  AIMS (if indicated):     Assets:  Social Support  ADL's:  Impaired  Cognition:  Impaired,  Mild and Moderate  Sleep:  Number of Hours: 6.75     Treatment Plan Summary: Daily contact with patient to assess and evaluate symptoms and progress in treatment, Medication management and Plan Continue current antidepressant regimen but I am also going to see if we can possibly pursue ECT.  Patient gave tentative verbal consent today.  I will also however try to speak with his father about it.  Spoke with nephrology and we can probably coordinate around his dialysis schedule.  Made some adjustments to insulin today at the recommendation of diabetic coordinator.  Alethia Berthold, MD 04/04/2018, 4:31 PM

## 2018-04-04 NOTE — Progress Notes (Signed)
Pt isolative in his room, with flat affect, withdrawn. When ask question, his answers were guarded and short and did not want to go any detail. Denies any suicide ideation and contracts for safety.  Pt compliance with his medications but will not come out to medication room for his meds. Medication was taken to patient in his room. Pt also noted to had his dinner in his room. Staff will continue to encourage patient to participate in his treatment while here at [facility]. Currently resting in bed with eyes close and respiration noted. Will continue q 15-minute safety checks.

## 2018-04-04 NOTE — Progress Notes (Signed)
Inpatient Diabetes Program Recommendations  AACE/ADA: New Consensus Statement on Inpatient Glycemic Control   Target Ranges:  Prepandial:   less than 140 mg/dL      Peak postprandial:   less than 180 mg/dL (1-2 hours)      Critically ill patients:  140 - 180 mg/dL   Results for Edward Colon, Edward Colon (MRN [id number]) as of 04/04/2018 08:17  Ref. Range 04/03/2018 07:13 04/03/2018 07:58 04/03/2018 11:38 04/03/2018 17:47 04/03/2018 20:45 04/04/2018 06:55 04/04/2018 07:20 04/04/2018 07:22  Glucose-Capillary Latest Ref Range: 70 - 99 mg/dL 32 (LL) 144 (H)    Lantus 12 units 390 (H)  Novolog 11 units 156 (H)  Novolog 7 units 267 (H)    Lantus 12 units  60 (L) 58 (L) 63 (L)   Review of Glycemic Control  Diabetes history:DM1 (makes NO insulin; will require basal, correction, and carbohydrate coverage insulin) Outpatient Diabetes medications: Lantus 14 units QAM, Lantus 8 units QHS, Novolog 0-9 units TID with meals Current orders for Inpatient glycemic control: Lantus 12 units BID, Novolog 6 units TID with meals, Novolog 0-5 units TID with meals  Inpatient Diabetes Program Recommendations:  Insulin - Basal: Please consider decreasing Lantus to 10 units BID.  Thanks, Barnie Alderman, RN, MSN, CDE Diabetes Coordinator Inpatient Diabetes Program [phone] (Team Pager from 8am to 5pm)

## 2018-04-05 LAB — GLUCOSE, CAPILLARY
Glucose-Capillary: 125 mg/dL — ABNORMAL HIGH (ref 70–99)
Glucose-Capillary: 71 mg/dL (ref 70–99)
Glucose-Capillary: 358 mg/dL — ABNORMAL HIGH (ref 70–99)
Glucose-Capillary: 288 mg/dL — ABNORMAL HIGH (ref 70–99)

## 2018-04-05 MED ORDER — RISPERIDONE 1 MG PO TBDP
1.0000 mg | ORAL_TABLET | Freq: Two times a day (BID) | ORAL | Status: DC
  Administered 2018-04-05 – 2018-04-10 (×10): 1 mg via ORAL
  Filled 2018-04-05 (×11): qty 1

## 2018-04-05 MED ORDER — MINOXIDIL 2.5 MG PO TABS
5.0000 mg | ORAL_TABLET | Freq: Every day | ORAL | Status: DC
  Administered 2018-04-05 – 2018-04-11 (×7): 5 mg via ORAL
  Filled 2018-04-05 (×7): qty 2

## 2018-04-05 MED ORDER — CLONIDINE HCL 0.1 MG PO TABS
0.1000 mg | ORAL_TABLET | Freq: Once | ORAL | Status: AC
  Administered 2018-04-05: 0.1 mg via ORAL

## 2018-04-05 NOTE — Progress Notes (Signed)
Spoke with the patient's attending this evening at this time regarding patient's medications for controlling blood sugars. After the attending consultation and review with this writer over the patient's medications, orders received to give all insulins scheduled. Will continue to monitor for safety.

## 2018-04-05 NOTE — [facility] Group Notes (Signed)
LCSW Group Therapy Note  04/05/2018 1:00 PM  Type of Therapy/Topic:  Group Therapy:  Balance in Life  Participation Level:  Minimal  Description of Group:    This group will address the concept of balance and how it feels and looks when one is unbalanced. Patients will be encouraged to process areas in their lives that are out of balance and identify reasons for remaining unbalanced. Facilitators will guide patients in utilizing problem-solving interventions to address and correct the stressor making their life unbalanced. Understanding and applying boundaries will be explored and addressed for obtaining and maintaining a balanced life. Patients will be encouraged to explore ways to assertively make their unbalanced needs known to significant others in their lives, using other group members and facilitator for support and feedback.  Therapeutic Goals: 1. Patient will identify two or more emotions or situations they have that consume much of in their lives. 2. Patient will identify signs/triggers that life has become out of balance:  3. Patient will identify two ways to set boundaries in order to achieve balance in their lives:  4. Patient will demonstrate ability to communicate their needs through discussion and/or role plays  Summary of Patient Progress: Patient was in attendance, however did not participate in group discussions.    Therapeutic Modalities:   Cognitive Behavioral Therapy Solution-Focused Therapy Assertiveness Training  Assunta Curtis MSW, LCSW 04/05/2018 2:08 PM

## 2018-04-05 NOTE — Progress Notes (Signed)
D: Pt denies SI/HI/AVH, can contract for safety. Pt is pleasant and cooperative, engages appropriate, but a little minimal. Pt. has no Complaints, denies pain. Patient Interactions appropriate. No behavioral concerns to report.   A: Q x 15 minute observation checks were completed for safety. Patient was provided with education, but needs reinforcement. Patient was given/offered medications per orders. Patient  was encourage to attend groups, participate in unit activities and continue with plan of care. Pt. Chart and plans of care reviewed. Pt. Given support and encouragement.   R: Patient is complaint with medication and unit procedures. Pt. Weighed this evening after dialysis. Pt. Blood sugars monitored per MD orders. Pt. Attends snack time, eating good. Pt. Unable to go to group due to being at dialysis this evening.             Precautionary checks every 15 minutes for safety maintained, room free of safety hazards, patient sustains no injury or falls during this shift. Will endorse care to next shift.

## 2018-04-05 NOTE — Plan of Care (Signed)
Pt. Denies si/hi/avh, can contract for safety. Pt. Reports he can remain safe while on the unit and off.    Problem: Safety: Goal: Periods of time without injury will increase Outcome: Progressing   Problem: Self-Concept: Goal: Ability to disclose and discuss suicidal ideas will improve Outcome: Progressing   Problem: Safety: Goal: Ability to disclose and discuss suicidal ideas will improve Outcome: Progressing

## 2018-04-05 NOTE — [facility] Group Notes (Signed)
[place] Group Notes:  (Nursing/MHT/Case Management/Adjunct)  Date:  04/05/2018  Time:  9:17 PM  Type of Therapy:  Group Therapy  Participation Level:  Did Not Attend  Participation Quality:  Pt off unit   [name] 04/05/2018, 9:17 PM

## 2018-04-05 NOTE — Progress Notes (Signed)
[facility] MD Progress Note  04/05/2018 4:45 PM Edward Colon  MRN:  [id number] Subjective: Follow-up a patient with major depression.  Patient seen chart reviewed.  Also spoke with the patient's father on the telephone.  Patient remains very withdrawn most of the time.  He attends groups occasionally but is only passively involved.  Otherwise stays in bed almost constantly and when I go speak with him he is awake but responds with whispers of only a couple words.  Eating is intermittent although he did request increased food earlier today.  This condition has been addressed by dietary already.  Blood sugars still up and down.  I suggested to the patient again today that we proceed with ECT.  He told me that he was okay with it.  However, because he is so withdrawn and I did not feel comfortable proceeding without talking to the closest family members.  I spoke with his father by telephone and the father has some concerns and does not feel entirely comfortable with starting tomorrow.  For that reason we are going to put off treatment and continuing with medication Principal Problem: MDD (major depressive disorder), recurrent severe, without psychosis ([place]) Diagnosis: Principal Problem:   MDD (major depressive disorder), recurrent severe, without psychosis ([place]) Active Problems:   Seizures ([place])   Hypertension   Type 1 diabetes ([place])   Acute on chronic renal failure ([place])  Total Time spent with patient: 30 minutes  Past Psychiatric History: Depression which has been gradually getting worse  Past Medical History:  Past Medical History:  Diagnosis Date  . Anemia   . Blind left eye since ~ 2010  . Depression   . Diabetic peripheral neuropathy ([place])   . ESRD (end stage renal disease) on dialysis [facility])    "TTS; [place]; [facility]" (02/01/2016)  . Heart murmur    denies any problems with it  . Hypertension   . Seizures ([place])    "last one was end of 2016; they are related to my diabetes"  (02/01/2016)  . Type 1 diabetes ([place]) dx'd 1990    Past Surgical History:  Procedure Laterality Date  . AV FISTULA PLACEMENT Right 03/03/2015   Procedure: RIGHT RADIO-CEPHALIC ARTERIOVENOUS (AV) FISTULA CREATION;  Surgeon: Serafina Mitchell, MD;  Location: [facility] OR;  Service: Vascular;  Laterality: Right;  . AV FISTULA PLACEMENT Left 06/15/2015   Procedure: INSERTION OF LEFT UPPER ARM  ARTERIOVENOUS (AV) 76mm x 50cm GORE-TEX GRAFT;  Surgeon: Elam Dutch, MD;  Location: [place];  Service: Vascular;  Laterality: Left;  . BASCILIC VEIN TRANSPOSITION Left 04/01/2015   Procedure: BASCILIC VEIN TRANSPOSITION-LEFT ARM- FIRST STAGE;  Surgeon: Elam Dutch, MD;  Location: [place];  Service: Vascular;  Laterality: Left;  . EYE SURGERY Right ~ 2010   for diabetic retinopathy  . INSERTION OF DIALYSIS CATHETER Right 03/03/2015   Procedure: INSERTION OF DIALYSIS CATHETER;  Surgeon: Serafina Mitchell, MD;  Location: [facility] OR;  Service: Vascular;  Laterality: Right;   Family History:  Family History  Problem Relation Age of Onset  . Diabetes Neg Hx    Family Psychiatric  History: None known Social History:  Social History   Substance and Sexual Activity  Alcohol Use No  . Frequency: Never   Comment: 02/01/2016 "might have 1 drink/year"     Social History   Substance and Sexual Activity  Drug Use No    Social History   Socioeconomic History  . Marital status: Single    Spouse  name: Not on file  . Number of children: Not on file  . Years of education: Not on file  . Highest education level: Not on file  Occupational History  . Not on file  Social Needs  . Financial resource strain: Not on file  . Food insecurity:    Worry: Not on file    Inability: Not on file  . Transportation needs:    Medical: Not on file    Non-medical: Not on file  Tobacco Use  . Smoking status: Never Smoker  . Smokeless tobacco: Never Used  Substance and Sexual Activity  . Alcohol use: No    Frequency: Never     Comment: 02/01/2016 "might have 1 drink/year"  . Drug use: No  . Sexual activity: Yes  Lifestyle  . Physical activity:    Days per week: Not on file    Minutes per session: Not on file  . Stress: Not on file  Relationships  . Social connections:    Talks on phone: Not on file    Gets together: Not on file    Attends religious service: Not on file    Active member of club or organization: Not on file    Attends meetings of clubs or organizations: Not on file    Relationship status: Not on file  Other Topics Concern  . Not on file  Social History Narrative  . Not on file   Additional Social History:                         Sleep: Fair  Appetite:  Fair  Current Medications: Current Facility-Administered Medications  Medication Dose Route Frequency Provider Last Rate Last Dose  . 0.9 %  sodium chloride infusion  100 mL Intravenous PRN Alric Seton, PA-C      . 0.9 %  sodium chloride infusion  100 mL Intravenous PRN Alric Seton, PA-C      . acetaminophen (TYLENOL) tablet 650 mg  650 mg Oral Q4H PRN [name], MD      . alteplase (CATHFLO ACTIVASE) injection 2 mg  2 mg Intracatheter Once PRN Alric Seton, PA-C      . alum & mag hydroxide-simeth (MAALOX/MYLANTA) 200-200-20 MG/5ML suspension 30 mL  30 mL Oral Q4H PRN [name], MD      . calcium acetate (PHOSLO) capsule 2,001 mg  2,001 mg Oral TID WC [name], MD   2,001 mg at 04/05/18 1235  . Chlorhexidine Gluconate Cloth 2 % PADS 6 each  6 each Topical Q0600 [name], MD   6 each at 04/05/18 [id number]  . cinacalcet (SENSIPAR) tablet 90 mg  90 mg Oral Q T,Th,Sa-HD [name], MD   90 mg at 04/05/18 1235  . FLUoxetine (PROZAC) capsule 20 mg  20 mg Oral Daily [name], MD   20 mg at 04/05/18 0840  . heparin injection 1,000 Units  1,000 Units Dialysis PRN Alric Seton, PA-C      . heparin injection 1,700 Units  20 Units/kg Dialysis PRN Alric Seton, PA-C      . heparin  injection 4,300 Units  50 Units/kg Dialysis PRN Kolluru, Sarath, MD      . insulin aspart (novoLOG) injection 0-5 Units  0-5 Units Subcutaneous TID WC Monice Lundy, [name], MD   1 Units at 04/03/18 1754  . insulin aspart (novoLOG) injection 6 Units  6 Units Subcutaneous TID WC Xayden Linsey, [name], MD  6 Units at 04/05/18 0829  . insulin glargine (LANTUS) injection 10 Units  10 Units Subcutaneous BID Kahliyah Dick, [name], MD   10 Units at 04/05/18 0825  . isosorbide-hydrALAZINE (BIDIL) 20-37.5 MG per tablet 1 tablet  1 tablet Oral TID [name], MD   1 tablet at 04/05/18 1236  . levETIRAcetam (KEPPRA) tablet 250 mg  250 mg Oral Q T,Th,Sat-1800 [name], MD   250 mg at 04/03/18 1752  . levETIRAcetam (KEPPRA) tablet 500 mg  500 mg Oral Q0600 [name], MD   500 mg at 04/05/18 0548  . lidocaine (PF) (XYLOCAINE) 1 % injection 5 mL  5 mL Intradermal PRN Alric Seton, PA-C      . lidocaine-prilocaine (EMLA) cream 1 application  1 application Topical PRN Alric Seton, PA-C      . minoxidil (LONITEN) tablet 5 mg  5 mg Oral q1800 Cyanne Delmar T, MD      . multivitamin (RENA-VIT) tablet 1 tablet  1 tablet Oral QHS [name], MD   1 tablet at 04/04/18 2106  . NIFEdipine (PROCARDIA-XL/NIFEDICAL-XL) 24 hr tablet 30 mg  30 mg Oral BID [name], MD   30 mg at 04/05/18 0825  . pentafluoroprop-tetrafluoroeth (GEBAUERS) aerosol 1 application  1 application Topical PRN Alric Seton, PA-C      . risperiDONE (RISPERDAL M-TABS) disintegrating tablet 1 mg  1 mg Oral BID Medard Decuir, [name], MD        Lab Results:  Results for orders placed or performed during the hospital encounter of 03/28/18 (from the past 48 hour(s))  Glucose, capillary     Status: Abnormal   Collection Time: 04/03/18  5:47 PM  Result Value Ref Range   Glucose-Capillary 156 (H) 70 - 99 mg/dL  Glucose, capillary     Status: Abnormal   Collection Time: 04/03/18  8:45 PM  Result Value Ref Range   Glucose-Capillary 267  (H) 70 - 99 mg/dL   Comment 1 Notify RN   Glucose, capillary     Status: Abnormal   Collection Time: 04/04/18  6:55 AM  Result Value Ref Range   Glucose-Capillary 60 (L) 70 - 99 mg/dL   Comment 1 Notify RN   Glucose, capillary     Status: Abnormal   Collection Time: 04/04/18  7:20 AM  Result Value Ref Range   Glucose-Capillary 58 (L) 70 - 99 mg/dL  Glucose, capillary     Status: Abnormal   Collection Time: 04/04/18  7:22 AM  Result Value Ref Range   Glucose-Capillary 63 (L) 70 - 99 mg/dL  Glucose, capillary     Status: Abnormal   Collection Time: 04/04/18 12:01 PM  Result Value Ref Range   Glucose-Capillary 105 (H) 70 - 99 mg/dL  Glucose, capillary     Status: Abnormal   Collection Time: 04/04/18  4:32 PM  Result Value Ref Range   Glucose-Capillary 111 (H) 70 - 99 mg/dL  Glucose, capillary     Status: Abnormal   Collection Time: 04/04/18  9:03 PM  Result Value Ref Range   Glucose-Capillary 147 (H) 70 - 99 mg/dL  Glucose, capillary     Status: Abnormal   Collection Time: 04/05/18  7:18 AM  Result Value Ref Range   Glucose-Capillary 125 (H) 70 - 99 mg/dL   Comment 1 Notify RN   Glucose, capillary     Status: None   Collection Time: 04/05/18 11:28 AM  Result Value Ref Range   Glucose-Capillary 71  70 - 99 mg/dL  Glucose, capillary     Status: Abnormal   Collection Time: 04/05/18  4:30 PM  Result Value Ref Range   Glucose-Capillary 358 (H) 70 - 99 mg/dL   Comment 1 Notify RN     Blood Alcohol level:  Lab Results  Component Value Date   ETH <10 03/12/2018   ETH <10 10/93/2355    Metabolic Disorder Labs: Lab Results  Component Value Date   HGBA1C 9.1 (H) 02/13/2018   MPG 214.47 02/13/2018   MPG 249 08/31/2014   No results found for: PROLACTIN Lab Results  Component Value Date   TRIG 221 (H) 02/10/2018    Physical Findings: AIMS:  , ,  ,  ,    CIWA:    COWS:     Musculoskeletal: Strength & Muscle Tone: within normal limits Gait & Station: normal Patient  leans: N/A  Psychiatric Specialty Exam: Physical Exam  Nursing note and vitals reviewed. Constitutional: He appears well-developed and well-nourished.  HENT:  Head: Normocephalic and atraumatic.  Eyes: Pupils are equal, round, and reactive to light. Conjunctivae are normal.  Neck: Normal range of motion.  Cardiovascular: Regular rhythm and normal heart sounds.  Respiratory: Effort normal. No respiratory distress.  GI: Soft.  Musculoskeletal: Normal range of motion.  Neurological: He is alert.  Skin: Skin is warm and dry.  Psychiatric: His affect is blunt. His speech is delayed. He is slowed and withdrawn. Cognition and memory are impaired.    Review of Systems  Constitutional: Positive for malaise/fatigue.  HENT: Negative.   Eyes: Negative.   Respiratory: Negative.   Cardiovascular: Negative.   Gastrointestinal: Negative.   Musculoskeletal: Negative.   Skin: Negative.   Neurological: Negative.   Psychiatric/Behavioral: Positive for depression. Negative for hallucinations, memory loss, substance abuse and suicidal ideas. The patient is nervous/anxious. The patient does not have insomnia.     Blood pressure (!) 180/89, pulse 99, temperature 98.6 F (37 C), temperature source Oral, resp. rate 18, height 6' (1.829 m), weight 91.6 kg, SpO2 100 %.Body mass index is 27.4 kg/m.  General Appearance: Casual  Eye Contact:  Minimal  Speech:  Garbled and Slow  Volume:  Decreased  Mood:  Dysphoric  Affect:  Constricted  Thought Process:  Disorganized  Orientation:  Full (Time, Place, and Person)  Thought Content:  Rumination  Suicidal Thoughts:  No  Homicidal Thoughts:  No  Memory:  Immediate;   Fair Recent;   Poor Remote;   Fair  Judgement:  Impaired  Insight:  Shallow  Psychomotor Activity:  Decreased  Concentration:  Concentration: Poor  Recall:  Poor  Fund of Knowledge:  Poor  Language:  Fair  Akathisia:  No  Handed:  Right  AIMS (if indicated):     Assets:  Social  Support  ADL's:  Impaired  Cognition:  Impaired,  Mild  Sleep:  Number of Hours: 7.45     Treatment Plan Summary: Daily contact with patient to assess and evaluate symptoms and progress in treatment, Medication management and Plan I have recommended ECT but because the family is not fully agreeable to it I am going to put off starting it.  Patient is stable although with blood sugars that are still up and down and requires a lot of constant attention.  He is on Prozac and Risperdal for his depression with psychotic features.  Continue to encourage him to attend groups and to do individual assessment and therapy.  Alethia Berthold, MD 04/05/2018, 4:45 PM

## 2018-04-05 NOTE — Progress Notes (Signed)
Patient just arrived on the unit from dialysis at this time.

## 2018-04-05 NOTE — Progress Notes (Addendum)
Recreation Therapy Notes  Date: 04/05/2018  Time: 9:30 am  Location: Craft Room  Behavioral response: Appropriate  Intervention Topic: Strengths  Discussion/Intervention:  Group content today was focused on strengths. The group identified some of the strengths they have. Individuals stated reason why they do not use their strengths. Patients expressed what strengths others see in them. The group identified important reason to use their strengths. The group participated in the intervention "Picking strengths", where they had a chance to identify some of their strengths. Clinical Observations/Feedback:  Patient came to group and was focused on what peers and staff had to say about strengths. He stated he could work on his patience to improve his strengths. Individual was social with peers and staff while participating in the intervention. Rasheeda Mulvehill LRT/CTRS         Jamesha Ellsworth 04/05/2018 10:35 AM

## 2018-04-05 NOTE — Plan of Care (Signed)
Pt. Denies si/hi/avh, can contract for safety. Pt. Monitored for safety.    Problem: Safety: Goal: Periods of time without injury will increase Outcome: Progressing   Problem: Self-Concept: Goal: Ability to disclose and discuss suicidal ideas will improve Outcome: Progressing   Problem: Safety: Goal: Ability to disclose and discuss suicidal ideas will improve Outcome: Progressing

## 2018-04-05 NOTE — Progress Notes (Signed)
[place], [place] 04/05/18  Subjective:   Doing fair. No acute c/o States he is able to eat without nausea or vomiting  Objective:  Vital signs in last 24 hours:  Temp:  [98.6 F (37 C)] 98.6 F (37 C) (01/30 0629) Pulse Rate:  [70-100] 99 (01/30 1144) Resp:  [18] 18 (01/30 0629) BP: (148-180)/(70-89) 180/89 (01/30 1144) SpO2:  [100 %] 100 % (01/30 1144) Weight:  [91.6 kg] 91.6 kg (01/30 0747)  Weight change: 2.722 kg Filed Weights   04/03/18 1715 04/04/18 2016 04/05/18 0747  Weight: 87.5 kg 91.6 kg 91.6 kg    Intake/Output:    Intake/Output Summary (Last 24 hours) at 04/05/2018 1537 Last data filed at 04/04/2018 2100 Gross per 24 hour  Intake 250 ml  Output -  Net 250 ml     Physical Exam: General:  No acute distress, laying in bed  HEENT  anicteric, moist oral mucous membranes  Neck  supple, no masses  Pulm/lungs  normal breathing effort, clear to auscultation  CVS/Heart  regular rhythm, 2/6 systolic murmur  Abdomen:   Soft, nontender  Extremities:  No peripheral edema  Neurologic:  Alert, oriented  Skin:  No acute rashes  Access:  Left arm AV graft       Basic Metabolic Panel:  Recent Labs  Lab 03/31/18 0930 04/02/18 0749 04/03/18 1016 04/03/18 1353  NA 133* 134*  --  133*  K 4.9 6.0* 6.9* 5.6*  CL 94* 94*  --  92*  CO2 25 25  --  29  GLUCOSE 170* 186*  --  260*  BUN 79* 89*  --  67*  CREATININE 12.81* 13.81*  --  12.91*  CALCIUM 8.6* 9.1  --  8.5*  PHOS 3.7 4.6  --  4.5     CBC: Recent Labs  Lab 03/31/18 0930 04/02/18 0749  WBC 4.0 4.5  HGB 10.1* 10.2*  HCT 29.8* 30.3*  MCV 83.7 85.1  PLT 364 380      Lab Results  Component Value Date   HEPBSAG Negative 03/04/2015   HEPBSAB Reactive 03/04/2015      Microbiology:  No results found for this or any previous visit (from the past 240 hour(s)).  Coagulation Studies: No results for input(s): LABPROT, INR in the last 72 hours.  Urinalysis: No  results for input(s): COLORURINE, LABSPEC, PHURINE, GLUCOSEU, HGBUR, BILIRUBINUR, KETONESUR, PROTEINUR, UROBILINOGEN, NITRITE, LEUKOCYTESUR in the last 72 hours.  Invalid input(s): APPERANCEUR    Imaging: No results found.   Medications:   . sodium chloride    . sodium chloride     . calcium acetate  2,001 mg Oral TID WC  . Chlorhexidine Gluconate Cloth  6 each Topical Q0600  . cinacalcet  90 mg Oral Q T,Th,Sa-HD  . FLUoxetine  20 mg Oral Daily  . insulin aspart  0-5 Units Subcutaneous TID WC  . insulin aspart  6 Units Subcutaneous TID WC  . insulin glargine  10 Units Subcutaneous BID  . isosorbide-hydrALAZINE  1 tablet Oral TID  . levETIRAcetam  250 mg Oral Q T,Th,Sat-1800  . levETIRAcetam  500 mg Oral Q0600  . minoxidil  2.5 mg Oral q1800  . multivitamin  1 tablet Oral QHS  . NIFEdipine  30 mg Oral BID  . risperiDONE  0.5 mg Oral BID   sodium chloride, sodium chloride, acetaminophen, alteplase, alum & mag hydroxide-simeth, heparin, heparin, heparin, lidocaine (PF), lidocaine-prilocaine, pentafluoroprop-tetrafluoroeth  Assessment/ Plan:  38 y.o. African-American male with end  stage renal disease on hemodialysis, depression, diabetes mellitus type I insulin dependent, and hypertension, who [facility]  TTS [facility] ([place] /Left AVG/88.5 kg  1.  End-stage renal disease 2.  Hyperkalemia 3.  Hypertension with chronic kidney disease 4.  Anemia of chronic kidney disease 5.  Secondary hyperparathyroidism  Dialysis today. [facility] team considering ECT on MWF  Continue calcium acetate with meals and cinacalcet Minoxidil and nifedipine for hypertension We will consider adding losartan for hypertension once potassium is better controlled     LOS: [place] 1/30/20203:37 PM  [place], [place]  Note: This note was prepared with Dragon  dictation. Any transcription errors are unintentional

## 2018-04-05 NOTE — Plan of Care (Signed)
Patient is alert and oriented X 4, denies SI, HI and AVH. Today patient was more talkative to RN, patient agreed to attend group and was active in group. Patient still does not interact with peers on the unit. Patient glucose reading this morning 125; patient received 6 units of Novolog and 10 units of the Lantus. Patient still has a sad effect and isolates most of the time. Problem: Safety: Goal: Periods of time without injury will increase Outcome: Progressing   Problem: Education: Goal: Ability to make informed decisions regarding treatment will improve Outcome: Progressing   Problem: Self-Concept: Goal: Ability to disclose and discuss suicidal ideas will improve Outcome: Progressing Goal: Will verbalize positive feelings about self Outcome: Progressing   Problem: Education: Goal: Utilization of techniques to improve thought processes will improve Outcome: Progressing Goal: Knowledge of the prescribed therapeutic regimen will improve Outcome: Progressing

## 2018-04-05 NOTE — Progress Notes (Signed)
D: Pt denies SI/HI/AVH, can contract for safety. Pt is pleasant and cooperative this evening, engages appropriately during assessments. Pt. Reports he is doing good. Pt. Also endorses a normal mood. Pt. Energy this evening seems better. Pt. Doesn't present to this Probation officer as paranoid. Pt. Is out occasionally this evening, but goes to bed early. No behavioral concerns to report this evening. Pt. Affect flat.   A: Q x 15 minute observation checks were completed for safety. Patient was provided with education. Patient was given/offered medications per orders. Patient  was encourage to attend groups, participate in unit activities and continue with plan of care. Pt. Chart and plans of care reviewed. Pt. Given support and encouragement.   R: Patient is complaint with medications when they are brought to his room. Pt. Blood sugars monitored per MD orders. Pt. Eating good, eats good snack. Pt. Weighed this evening per orders. Pt. Intake and output monitored.             Precautionary checks every 15 minutes for safety maintained, room free of safety hazards, patient sustains no injury or falls during this shift. Will endorse care to next shift.

## 2018-04-05 NOTE — Progress Notes (Signed)
Patient ID: Edward Colon, male   DOB: [birth year], 38 y.o.   MRN: [id number] PER STATE REGULATIONS 482.30  THIS CHART WAS REVIEWED FOR MEDICAL NECESSITY WITH RESPECT TO THE PATIENT'S ADMISSION/ DURATION OF STAY.  NEXT REVIEW DATE:  04/09/2018 Chauncy Lean, RN, BSN CASE MANAGER

## 2018-04-06 LAB — GLUCOSE, CAPILLARY
GLUCOSE-CAPILLARY: 243 mg/dL — AB (ref 70–99)
Glucose-Capillary: 226 mg/dL — ABNORMAL HIGH (ref 70–99)
Glucose-Capillary: 399 mg/dL — ABNORMAL HIGH (ref 70–99)
Glucose-Capillary: 197 mg/dL — ABNORMAL HIGH (ref 70–99)

## 2018-04-06 NOTE — [facility] Group Notes (Signed)
[facility] Group Notes:  (Nursing/MHT/Case Management/Adjunct)  Date:  04/06/2018  Time:  2:44 PM  Type of Therapy:  Psychoeducational Skills  Participation Level:  Active  Participation Quality:  Appropriate and Attentive  Affect:  Appropriate  Cognitive:  Oriented  Insight:  Good  Engagement in Group:  Engaged  Modes of Intervention:  Discussion, Education and Exploration  Summary of Progress/Problems:  Kathi Ludwig 04/06/2018, 2:44 PM

## 2018-04-06 NOTE — Progress Notes (Signed)
[facility] MD Progress Note  04/06/2018 5:32 PM Edward Colon  MRN:  [id number] Subjective: Follow-up patient with major depression as well as dialysis and diabetes.  Patient's family was not comfortable with ECT so we did not go forward with it.  This afternoon the patient was spontaneously getting up out of bed walking around the ward.  He spoke with me briefly.  Speech is very quiet but he does make some sense.  He says he is feeling a little bit better.  Cannot give any more detail than that.  Denies any specific suicidal intent.  Says he is feeling less paranoid.  Affect looked a little more calm and appropriate.  Blood sugars still labile but responding to close monitoring. Principal Problem: MDD (major depressive disorder), recurrent severe, without psychosis ([place]) Diagnosis: Principal Problem:   MDD (major depressive disorder), recurrent severe, without psychosis ([place]) Active Problems:   Seizures ([place])   Hypertension   Type 1 diabetes ([place])   Acute on chronic renal failure ([place])  Total Time spent with patient: 20 minutes  Past Psychiatric History: History of worsening depression over the past few years  Past Medical History:  Past Medical History:  Diagnosis Date  . Anemia   . Blind left eye since ~ 2010  . Depression   . Diabetic peripheral neuropathy ([place])   . ESRD (end stage renal disease) on dialysis [facility])    "TTS; [place] [facility]; [facility]" (02/01/2016)  . Heart murmur    denies any problems with it  . Hypertension   . Seizures ([place])    "last one was end of 2016; they are related to my diabetes" (02/01/2016)  . Type 1 diabetes ([place]) dx'd 1990    Past Surgical History:  Procedure Laterality Date  . AV FISTULA PLACEMENT Right 03/03/2015   Procedure: RIGHT RADIO-CEPHALIC ARTERIOVENOUS (AV) FISTULA CREATION;  Surgeon: Serafina Mitchell, MD;  Location: [facility] OR;  Service: Vascular;  Laterality: Right;  . AV FISTULA PLACEMENT Left 06/15/2015   Procedure: INSERTION OF LEFT UPPER  ARM  ARTERIOVENOUS (AV) 80mm x 50cm GORE-TEX GRAFT;  Surgeon: Elam Dutch, MD;  Location: [place];  Service: Vascular;  Laterality: Left;  . BASCILIC VEIN TRANSPOSITION Left 04/01/2015   Procedure: BASCILIC VEIN TRANSPOSITION-LEFT ARM- FIRST STAGE;  Surgeon: Elam Dutch, MD;  Location: [place];  Service: Vascular;  Laterality: Left;  . EYE SURGERY Right ~ 2010   for diabetic retinopathy  . INSERTION OF DIALYSIS CATHETER Right 03/03/2015   Procedure: INSERTION OF DIALYSIS CATHETER;  Surgeon: Serafina Mitchell, MD;  Location: [facility] OR;  Service: Vascular;  Laterality: Right;   Family History:  Family History  Problem Relation Age of Onset  . Diabetes Neg Hx    Family Psychiatric  History: See previous Social History:  Social History   Substance and Sexual Activity  Alcohol Use No  . Frequency: Never   Comment: 02/01/2016 "might have 1 drink/year"     Social History   Substance and Sexual Activity  Drug Use No    Social History   Socioeconomic History  . Marital status: Single    Spouse name: Not on file  . Number of children: Not on file  . Years of education: Not on file  . Highest education level: Not on file  Occupational History  . Not on file  Social Needs  . Financial resource strain: Not on file  . Food insecurity:    Worry: Not on file    Inability: Not on  file  . Transportation needs:    Medical: Not on file    Non-medical: Not on file  Tobacco Use  . Smoking status: Never Smoker  . Smokeless tobacco: Never Used  Substance and Sexual Activity  . Alcohol use: No    Frequency: Never    Comment: 02/01/2016 "might have 1 drink/year"  . Drug use: No  . Sexual activity: Yes  Lifestyle  . Physical activity:    Days per week: Not on file    Minutes per session: Not on file  . Stress: Not on file  Relationships  . Social connections:    Talks on phone: Not on file    Gets together: Not on file    Attends religious service: Not on file    Active member of  club or organization: Not on file    Attends meetings of clubs or organizations: Not on file    Relationship status: Not on file  Other Topics Concern  . Not on file  Social History Narrative  . Not on file   Additional Social History:                         Sleep: Fair  Appetite:  Fair  Current Medications: Current Facility-Administered Medications  Medication Dose Route Frequency Provider Last Rate Last Dose  . 0.9 %  sodium chloride infusion  100 mL Intravenous PRN Alric Seton, PA-C      . 0.9 %  sodium chloride infusion  100 mL Intravenous PRN Alric Seton, PA-C      . acetaminophen (TYLENOL) tablet 650 mg  650 mg Oral Q4H PRN [name], MD      . alteplase (CATHFLO ACTIVASE) injection 2 mg  2 mg Intracatheter Once PRN Alric Seton, PA-C      . alum & mag hydroxide-simeth (MAALOX/MYLANTA) 200-200-20 MG/5ML suspension 30 mL  30 mL Oral Q4H PRN [name], MD      . calcium acetate (PHOSLO) capsule 2,001 mg  2,001 mg Oral TID WC [name], MD   2,001 mg at 04/06/18 1658  . Chlorhexidine Gluconate Cloth 2 % PADS 6 each  6 each Topical Q0600 [name], MD   6 each at 04/06/18 [phone]  . cinacalcet (SENSIPAR) tablet 90 mg  90 mg Oral Q T,Th,Sa-HD [name], MD   90 mg at 04/05/18 1235  . FLUoxetine (PROZAC) capsule 20 mg  20 mg Oral Daily [name], MD   20 mg at 04/06/18 0801  . heparin injection 1,000 Units  1,000 Units Dialysis PRN Alric Seton, PA-C      . heparin injection 1,700 Units  20 Units/kg Dialysis PRN Alric Seton, PA-C      . heparin injection 4,300 Units  50 Units/kg Dialysis PRN Kolluru, Sarath, MD      . insulin aspart (novoLOG) injection 0-5 Units  0-5 Units Subcutaneous TID WC Adeyemi Hamad, [name], MD   2 Units at 04/06/18 1658  . insulin aspart (novoLOG) injection 6 Units  6 Units Subcutaneous TID WC Nicolet Griffy, [name], MD   6 Units at 04/06/18 1659  . insulin glargine (LANTUS) injection 10 Units  10 Units  Subcutaneous BID Keira Bohlin, [name], MD   10 Units at 04/06/18 1705  . isosorbide-hydrALAZINE (BIDIL) 20-37.5 MG per tablet 1 tablet  1 tablet Oral TID [name], MD   1 tablet at 04/06/18 1706  . levETIRAcetam (KEPPRA) tablet 250 mg  250 mg Oral Q T,Th,Sat-1800 [name], MD   250 mg at 04/05/18 2059  . levETIRAcetam (KEPPRA) tablet 500 mg  500 mg Oral Q0600 [name], MD   500 mg at 04/06/18 2979  . lidocaine (PF) (XYLOCAINE) 1 % injection 5 mL  5 mL Intradermal PRN Alric Seton, PA-C      . lidocaine-prilocaine (EMLA) cream 1 application  1 application Topical PRN Alric Seton, PA-C      . minoxidil (LONITEN) tablet 5 mg  5 mg Oral q1800 Lovell Roe, [name], MD   5 mg at 04/06/18 1706  . multivitamin (RENA-VIT) tablet 1 tablet  1 tablet Oral QHS [name], MD   1 tablet at 04/05/18 2057  . NIFEdipine (PROCARDIA-XL/NIFEDICAL-XL) 24 hr tablet 30 mg  30 mg Oral BID [name], MD   30 mg at 04/06/18 1711  . pentafluoroprop-tetrafluoroeth (GEBAUERS) aerosol 1 application  1 application Topical PRN Alric Seton, PA-C      . risperiDONE (RISPERDAL M-TABS) disintegrating tablet 1 mg  1 mg Oral BID Sten Dematteo T, MD   1 mg at 04/06/18 1658    Lab Results:  Results for orders placed or performed during the hospital encounter of 03/28/18 (from the past 48 hour(s))  Glucose, capillary     Status: Abnormal   Collection Time: 04/04/18  9:03 PM  Result Value Ref Range   Glucose-Capillary 147 (H) 70 - 99 mg/dL  Glucose, capillary     Status: Abnormal   Collection Time: 04/05/18  7:18 AM  Result Value Ref Range   Glucose-Capillary 125 (H) 70 - 99 mg/dL   Comment 1 Notify RN   Glucose, capillary     Status: None   Collection Time: 04/05/18 11:28 AM  Result Value Ref Range   Glucose-Capillary 71 70 - 99 mg/dL  Glucose, capillary     Status: Abnormal   Collection Time: 04/05/18  4:30 PM  Result Value Ref Range   Glucose-Capillary 358 (H) 70 - 99 mg/dL   Comment 1  Notify RN   Glucose, capillary     Status: Abnormal   Collection Time: 04/05/18  9:02 PM  Result Value Ref Range   Glucose-Capillary 288 (H) 70 - 99 mg/dL  Glucose, capillary     Status: Abnormal   Collection Time: 04/06/18  7:07 AM  Result Value Ref Range   Glucose-Capillary 226 (H) 70 - 99 mg/dL  Glucose, capillary     Status: Abnormal   Collection Time: 04/06/18 11:17 AM  Result Value Ref Range   Glucose-Capillary 399 (H) 70 - 99 mg/dL  Glucose, capillary     Status: Abnormal   Collection Time: 04/06/18  4:13 PM  Result Value Ref Range   Glucose-Capillary 243 (H) 70 - 99 mg/dL   Comment 1 Notify RN     Blood Alcohol level:  Lab Results  Component Value Date   ETH <10 03/12/2018   ETH <10 89/[birth year]/1941    Metabolic Disorder Labs: Lab Results  Component Value Date   HGBA1C 9.1 (H) 02/13/2018   MPG 214.47 02/13/2018   MPG 249 08/31/2014   No results found for: PROLACTIN Lab Results  Component Value Date   TRIG 221 (H) 02/10/2018    Physical Findings: AIMS:  , ,  ,  ,    CIWA:    COWS:     Musculoskeletal: Strength & Muscle Tone: within normal limits Gait & Station: normal Patient leans: N/A  Psychiatric Specialty Exam: Physical  Exam  Nursing note and vitals reviewed. Constitutional: He appears well-developed and well-nourished.  HENT:  Head: Normocephalic and atraumatic.  Eyes: Pupils are equal, round, and reactive to light. Conjunctivae are normal.  Neck: Normal range of motion.  Cardiovascular: Regular rhythm and normal heart sounds.  Respiratory: Effort normal. No respiratory distress.  GI: Soft.  Musculoskeletal: Normal range of motion.  Neurological: He is alert.  Skin: Skin is warm and dry.  Psychiatric: Judgment normal. His affect is blunt. His speech is delayed. He is slowed. Cognition and memory are impaired. He expresses no suicidal ideation.    Review of Systems  Constitutional: Negative.   HENT: Negative.   Eyes: Negative.   Respiratory:  Negative.   Cardiovascular: Negative.   Gastrointestinal: Negative.   Musculoskeletal: Negative.   Skin: Negative.   Neurological: Negative.   Psychiatric/Behavioral: Positive for depression.    Blood pressure (!) 156/90, pulse 90, temperature 98.5 F (36.9 C), temperature source Oral, resp. rate 16, height 6' (1.829 m), weight 89.8 kg, SpO2 95 %.Body mass index is 26.85 kg/m.  General Appearance: Casual  Eye Contact:  Fair  Speech:  Slow  Volume:  Decreased  Mood:  Dysphoric  Affect:  Constricted  Thought Process:  Coherent  Orientation:  Full (Time, Place, and Person)  Thought Content:  Rumination  Suicidal Thoughts:  No  Homicidal Thoughts:  No  Memory:  Immediate;   Fair Recent;   Fair Remote;   Fair  Judgement:  Fair  Insight:  Fair  Psychomotor Activity:  Decreased  Concentration:  Concentration: Fair  Recall:  [employer] of Knowledge:  Fair  Language:  Fair  Akathisia:  No  Handed:  Right  AIMS (if indicated):     Assets:  Desire for Improvement Resilience Social Support  ADL's:  Impaired  Cognition:  Impaired,  Mild  Sleep:  Number of Hours: 8     Treatment Plan Summary: Daily contact with patient to assess and evaluate symptoms and progress in treatment, Medication management and Plan Patient is on antidepressant and antipsychotic medicine and is not showing any side effects.  We talked about how ECT could still be an option but at this point we will be giving it at least a few days to see if he is continuing to improve.  Patient is agreeable to the plan.  Encourage group attendance and for him to be up out of bed as much as possible.  Continue current medical follow-up with nephrology and diabetes coordinator  Alethia Berthold, MD 04/06/2018, 5:32 PM

## 2018-04-06 NOTE — Plan of Care (Signed)
Patient is pleasant and cooperative on approach.Receptive with staff.Stated that he could not make a decision about ECT.Denies SI,HI and AVH.Attended groups.No somatic issues verbalized.Compliant with medications.Support and encouragement given.

## 2018-04-06 NOTE — Progress Notes (Signed)
Recreation Therapy Notes   Date: 04/06/2018  Time: 9:30 am  Location: Craft Room  Behavioral response: Appropriate  Intervention Topic: Teamwork  Discussion/Intervention:  Group content on today was focused on teamwork. The group identified what teamwork is. Individuals described who is a part of their team. Patients expressed why they thought teamwork is important. The group stated reasons why they thought it was easier to work with a Dance movement psychotherapist team. Individuals discussed some positives and negatives of working with a team. Patients gave examples of past experiences they had while working with a team. The group participated in the intervention "What is That", where patients were given a chance to point out qualities they look for in a team mate and were able to work in teams with each other. Clinical Observations/Feedback:  Patient came to group and stated team work helps solve things. He identified his family as apart of  his team. Individual was social with peers and staff while participating in the intervention. Edward Colon LRT/CTRS         Garvis Downum 04/06/2018 2:25 PM

## 2018-04-06 NOTE — [facility] Group Notes (Signed)
LCSW Group Therapy Note  04/06/2018 2:17 PM  Type of Therapy and Topic:  Group Therapy:  Feelings around Relapse and Recovery  Participation Level:  Active   Description of Group:    Patients in this group will discuss emotions they experience before and after a relapse. They will process how experiencing these feelings, or avoidance of experiencing them, relates to having a relapse. Facilitator will guide patients to explore emotions they have related to recovery. Patients will be encouraged to process which emotions are more powerful. They will be guided to discuss the emotional reaction significant others in their lives may have to their relapse or recovery. Patients will be assisted in exploring ways to respond to the emotions of others without this contributing to a relapse.  Therapeutic Goals: 1. Patient will identify two or more emotions that lead to a relapse for them 2. Patient will identify two emotions that result when they relapse 3. Patient will identify two emotions related to recovery 4. Patient will demonstrate ability to communicate their needs through discussion and/or role plays   Summary of Patient Progress:  Pt was appropriate in group. Pt reported that "general healthy conversation" is a way to keep him from relapsing in his mental health. Pt was respectful to responses provided by other group members.   Therapeutic Modalities:   Cognitive Behavioral Therapy Solution-Focused Therapy Assertiveness Training Relapse Prevention Therapy   Evalina Field, MSW, LCSW Clinical Social Work 04/06/2018 2:17 PM

## 2018-04-06 NOTE — Plan of Care (Signed)
Was visible in the milieu with peers. Played cards, laughing, talking...no sign of distress

## 2018-04-07 DIAGNOSIS — N186 End stage renal disease: Secondary | ICD-10-CM | POA: Diagnosis not present

## 2018-04-07 DIAGNOSIS — Z992 Dependence on renal dialysis: Secondary | ICD-10-CM | POA: Diagnosis not present

## 2018-04-07 DIAGNOSIS — E1022 Type 1 diabetes mellitus with diabetic chronic kidney disease: Secondary | ICD-10-CM | POA: Diagnosis not present

## 2018-04-07 LAB — GLUCOSE, CAPILLARY
GLUCOSE-CAPILLARY: 268 mg/dL — AB (ref 70–99)
Glucose-Capillary: 244 mg/dL — ABNORMAL HIGH (ref 70–99)
Glucose-Capillary: 33 mg/dL — CL (ref 70–99)
Glucose-Capillary: 35 mg/dL — CL (ref 70–99)
Glucose-Capillary: 87 mg/dL (ref 70–99)
Glucose-Capillary: 145 mg/dL — ABNORMAL HIGH (ref 70–99)

## 2018-04-07 NOTE — Progress Notes (Addendum)
Hypoglycemic Event  CBG: 33  Treatment: 8 oz juice/soda plus the patient given his lunch. Patient ate 100% of his meal.   Symptoms: Hungry  Follow-up CBG: Time:1439 CBG Result:35 Follow-up CBG: Time:1454 CBG Result:87  Possible Reasons for Event: Other: dialysis  Comments/MD notified: Dr. Minette Brine notified at 1430. Agricultural consultant notified. Hypoglycemia protocol followed. Pt. Denies pain. Pt. Alert and oriented x 4. Pt. Given additional snack and juice after blood sugar returned to [facility].    Edward Colon

## 2018-04-07 NOTE — Progress Notes (Signed)
[facility] MD Progress Note  04/07/2018 9:05 AM Edward Colon  MRN:  [id number] Subjective:  States it's been a little tough with depression, spent countless days in bed no food at wit's end. Wanted ECT felt so bad but parents don't want, I've changed my mind about ECT, try medicaiton, I'm getting up today. Never treated for depression before. He believes his health problems epilepsy DM are significant contributors. No SI today, no hallucinations. Slept some. Ate this am. Denies side effects. Doesn't present as paranoid as previously described.   Principal Problem: MDD (major depressive disorder), recurrent severe, without psychosis ([place]) Diagnosis: Principal Problem:   MDD (major depressive disorder), recurrent severe, without psychosis ([place]) Active Problems:   Seizures ([place])   Hypertension   Type 1 diabetes ([place])   Acute on chronic renal failure ([place])  Total Time spent with patient: 20 minutes  Past Psychiatric History: Depression for several years but no treatment  Past Medical History:  Past Medical History:  Diagnosis Date  . Anemia   . Blind left eye since ~ 2010  . Depression   . Diabetic peripheral neuropathy ([place])   . ESRD (end stage renal disease) on dialysis [facility])    "TTS; [place]; [facility]" (02/01/2016)  . Heart murmur    denies any problems with it  . Hypertension   . Seizures ([place])    "last one was end of 2016; they are related to my diabetes" (02/01/2016)  . Type 1 diabetes ([place]) dx'd 1990    Past Surgical History:  Procedure Laterality Date  . AV FISTULA PLACEMENT Right 03/03/2015   Procedure: RIGHT RADIO-CEPHALIC ARTERIOVENOUS (AV) FISTULA CREATION;  Surgeon: Serafina Mitchell, MD;  Location: [facility] OR;  Service: Vascular;  Laterality: Right;  . AV FISTULA PLACEMENT Left 06/15/2015   Procedure: INSERTION OF LEFT UPPER ARM  ARTERIOVENOUS (AV) 3mm x 50cm GORE-TEX GRAFT;  Surgeon: Elam Dutch, MD;  Location: [place];  Service: Vascular;  Laterality: Left;  .  BASCILIC VEIN TRANSPOSITION Left 04/01/2015   Procedure: BASCILIC VEIN TRANSPOSITION-LEFT ARM- FIRST STAGE;  Surgeon: Elam Dutch, MD;  Location: [place];  Service: Vascular;  Laterality: Left;  . EYE SURGERY Right ~ 2010   for diabetic retinopathy  . INSERTION OF DIALYSIS CATHETER Right 03/03/2015   Procedure: INSERTION OF DIALYSIS CATHETER;  Surgeon: Serafina Mitchell, MD;  Location: [facility] OR;  Service: Vascular;  Laterality: Right;   Family History:  Family History  Problem Relation Age of Onset  . Diabetes Neg Hx    Family Psychiatric  History: see prior Social History:  Social History   Substance and Sexual Activity  Alcohol Use No  . Frequency: Never   Comment: 02/01/2016 "might have 1 drink/year"     Social History   Substance and Sexual Activity  Drug Use No    Social History   Socioeconomic History  . Marital status: Single    Spouse name: Not on file  . Number of children: Not on file  . Years of education: Not on file  . Highest education level: Not on file  Occupational History  . Not on file  Social Needs  . Financial resource strain: Not on file  . Food insecurity:    Worry: Not on file    Inability: Not on file  . Transportation needs:    Medical: Not on file    Non-medical: Not on file  Tobacco Use  . Smoking status: Never Smoker  . Smokeless tobacco: Never Used  Substance and Sexual Activity  . Alcohol use: No    Frequency: Never    Comment: 02/01/2016 "might have 1 drink/year"  . Drug use: No  . Sexual activity: Yes  Lifestyle  . Physical activity:    Days per week: Not on file    Minutes per session: Not on file  . Stress: Not on file  Relationships  . Social connections:    Talks on phone: Not on file    Gets together: Not on file    Attends religious service: Not on file    Active member of club or organization: Not on file    Attends meetings of clubs or organizations: Not on file    Relationship status: Not on file  Other Topics  Concern  . Not on file  Social History Narrative  . Not on file   Additional Social History:                         Sleep: Fair  Appetite:  Fair  Current Medications: Current Facility-Administered Medications  Medication Dose Route Frequency Provider Last Rate Last Dose  . 0.9 %  sodium chloride infusion  100 mL Intravenous PRN Alric Seton, PA-C      . 0.9 %  sodium chloride infusion  100 mL Intravenous PRN Alric Seton, PA-C      . acetaminophen (TYLENOL) tablet 650 mg  650 mg Oral Q4H PRN [name], MD      . alteplase (CATHFLO ACTIVASE) injection 2 mg  2 mg Intracatheter Once PRN Alric Seton, PA-C      . alum & mag hydroxide-simeth (MAALOX/MYLANTA) 200-200-20 MG/5ML suspension 30 mL  30 mL Oral Q4H PRN [name], MD      . calcium acetate (PHOSLO) capsule 2,001 mg  2,001 mg Oral TID WC [name], MD   2,001 mg at 04/07/18 0757  . Chlorhexidine Gluconate Cloth 2 % PADS 6 each  6 each Topical Q0600 [name], MD   6 each at 04/06/18 [phone]  . cinacalcet (SENSIPAR) tablet 90 mg  90 mg Oral Q T,Th,Sa-HD [name], MD   90 mg at 04/05/18 1235  . FLUoxetine (PROZAC) capsule 20 mg  20 mg Oral Daily [name], MD   20 mg at 04/07/18 0757  . heparin injection 1,000 Units  1,000 Units Dialysis PRN Alric Seton, PA-C      . heparin injection 1,700 Units  20 Units/kg Dialysis PRN Alric Seton, PA-C      . heparin injection 4,300 Units  50 Units/kg Dialysis PRN Kolluru, Sarath, MD      . insulin aspart (novoLOG) injection 0-5 Units  0-5 Units Subcutaneous TID WC [name], [name], MD   2 Units at 04/07/18 0802  . insulin aspart (novoLOG) injection 6 Units  6 Units Subcutaneous TID WC [name], [name], MD   6 Units at 04/07/18 0801  . insulin glargine (LANTUS) injection 10 Units  10 Units Subcutaneous BID [name], [name], MD   10 Units at 04/07/18 0818  . isosorbide-hydrALAZINE (BIDIL) 20-37.5 MG per tablet 1 tablet  1 tablet Oral  TID [name], MD   1 tablet at 04/07/18 0757  . levETIRAcetam (KEPPRA) tablet 250 mg  250 mg Oral Q T,Th,Sat-1800 [name], MD   250 mg at 04/05/18 2059  . levETIRAcetam (KEPPRA) tablet 500 mg  500 mg Oral Q0600 [name], MD  500 mg at 04/07/18 0700  . lidocaine (PF) (XYLOCAINE) 1 % injection 5 mL  5 mL Intradermal PRN Alric Seton, PA-C      . lidocaine-prilocaine (EMLA) cream 1 application  1 application Topical PRN Alric Seton, PA-C      . minoxidil (LONITEN) tablet 5 mg  5 mg Oral q1800 [name], [name], MD   5 mg at 04/06/18 1706  . multivitamin (RENA-VIT) tablet 1 tablet  1 tablet Oral QHS [name], MD   1 tablet at 04/06/18 2216  . NIFEdipine (PROCARDIA-XL/NIFEDICAL-XL) 24 hr tablet 30 mg  30 mg Oral BID [name], MD   30 mg at 04/07/18 0757  . pentafluoroprop-tetrafluoroeth (GEBAUERS) aerosol 1 application  1 application Topical PRN Alric Seton, PA-C      . risperiDONE (RISPERDAL M-TABS) disintegrating tablet 1 mg  1 mg Oral BID [name], [name], MD   1 mg at 04/07/18 0757    Lab Results:  Results for orders placed or performed during the hospital encounter of 03/28/18 (from the past 48 hour(s))  Glucose, capillary     Status: None   Collection Time: 04/05/18 11:28 AM  Result Value Ref Range   Glucose-Capillary 71 70 - 99 mg/dL  Glucose, capillary     Status: Abnormal   Collection Time: 04/05/18  4:30 PM  Result Value Ref Range   Glucose-Capillary 358 (H) 70 - 99 mg/dL   Comment 1 Notify RN   Glucose, capillary     Status: Abnormal   Collection Time: 04/05/18  9:02 PM  Result Value Ref Range   Glucose-Capillary 288 (H) 70 - 99 mg/dL  Glucose, capillary     Status: Abnormal   Collection Time: 04/06/18  7:07 AM  Result Value Ref Range   Glucose-Capillary 226 (H) 70 - 99 mg/dL  Glucose, capillary     Status: Abnormal   Collection Time: 04/06/18 11:17 AM  Result Value Ref Range   Glucose-Capillary 399 (H) 70 - 99 mg/dL  Glucose,  capillary     Status: Abnormal   Collection Time: 04/06/18  4:13 PM  Result Value Ref Range   Glucose-Capillary 243 (H) 70 - 99 mg/dL   Comment 1 Notify RN   Glucose, capillary     Status: Abnormal   Collection Time: 04/06/18  8:55 PM  Result Value Ref Range   Glucose-Capillary 197 (H) 70 - 99 mg/dL   Comment 1 Notify RN   Glucose, capillary     Status: Abnormal   Collection Time: 04/07/18  7:06 AM  Result Value Ref Range   Glucose-Capillary 244 (H) 70 - 99 mg/dL   Comment 1 Notify RN     Blood Alcohol level:  Lab Results  Component Value Date   ETH <10 03/12/2018   ETH <10 90/24/0973    Metabolic Disorder Labs: Lab Results  Component Value Date   HGBA1C 9.1 (H) 02/13/2018   MPG 214.47 02/13/2018   MPG 249 08/31/2014   No results found for: PROLACTIN Lab Results  Component Value Date   TRIG 221 (H) 02/10/2018    Physical Findings: AIMS:  , ,  ,  ,    CIWA:    COWS:     Musculoskeletal: Strength & Muscle Tone: within normal limits Gait & Station: normal Patient leans: N/A  Psychiatric Specialty Exam: Physical Exam  ROS  Blood pressure 137/81, pulse 92, temperature 98.5 F (36.9 C), temperature source Oral, resp. rate 17, height 6' (1.829 m), weight 93.4 kg,  SpO2 100 %.Body mass index is 27.94 kg/m.  General Appearance: Fairly Groomed  Eye Contact:  Fair  Speech:  Slow  Volume:  Decreased  Mood:  Depressed  Affect:  Congruent  Thought Process:  Coherent  Orientation:  Full (Time, Place, and Person)  Thought Content:  focused on health and prolonged depression in bed prior to admission  Suicidal Thoughts:  No  Homicidal Thoughts:  No  Memory:  Immediate;   Fair  Judgement:  Fair  Insight:  Shallow  Psychomotor Activity:  Psychomotor Retardation  Concentration:  Concentration: Fair and Attention Span: Fair  Recall:  Fair can't recall blood sugar just taken by nurse  Fund of Knowledge:  Fair  Language:  Good  Akathisia:  No  Handed:  Right  AIMS  (if indicated):     Assets:  Communication Skills Desire for Improvement Social Support  ADL's:  Impaired  Cognition:  Impaired,  Mild  Sleep:  Number of Hours: 7.75     Treatment Plan Summary: Daily contact with patient to assess and evaluate symptoms and progress in treatment, Medication management and Plan I reinforced ECT as good back up  option to consider given the severity of the depression and lack of self management, he states he wants to give the medications a trial and states he will come out to groups more. Medical consults appreciated.   Patience Musca, MD 04/07/2018, 9:05 AM

## 2018-04-07 NOTE — Plan of Care (Signed)
D: Patient has been isolative to room. Denies SI, HI and AV hallucinations.  Affect is flat. Mood is sad. Did not come up to get snack at hs. HS CBG 145.  A: Continue to monitor and offer support. R: Safety maintained

## 2018-04-07 NOTE — Progress Notes (Signed)
Patient escorted to dialysis at this time by this Probation officer. Pt. With BHT for safety.

## 2018-04-07 NOTE — Progress Notes (Signed)
[place], [place] 04/07/18  Subjective:   Doing fair. No acute c/o   HEMODIALYSIS FLOWSHEET:  Blood Flow Rate (mL/min): 400 mL/min Arterial Pressure (mmHg): -200 mmHg Venous Pressure (mmHg): 190 mmHg Transmembrane Pressure (mmHg): 80 mmHg Ultrafiltration Rate (mL/min): 1150 mL/min Dialysate Flow Rate (mL/min): 800 ml/min Conductivity: Machine : 14 Conductivity: Machine : 14 Dialysis Fluid Bolus: Normal Saline Bolus Amount (mL): 0 mL Dialysate Change: 2K    Objective:  Vital signs in last 24 hours:  Temp:  [96 F (35.6 C)] 96 F (35.6 C) (02/01 1010) Pulse Rate:  [91-105] 96 (02/01 1245) Resp:  [12-18] 12 (02/01 1245) BP: (137-156)/(58-90) 146/72 (02/01 1245) SpO2:  [100 %] 100 % (02/01 1010) Weight:  [93.4 kg] 93.4 kg (02/01 1010)  Weight change:  Filed Weights   04/05/18 2050 04/07/18 0755 04/07/18 1010  Weight: 89.8 kg 93.4 kg 93.4 kg    Intake/Output:    Intake/Output Summary (Last 24 hours) at 04/07/2018 1318 Last data filed at 04/07/2018 0800 Gross per 24 hour  Intake 118 ml  Output -  Net 118 ml     Physical Exam: General:  No acute distress, laying in bed  HEENT  anicteric, moist oral mucous membranes  Neck  supple, no masses  Pulm/lungs  normal breathing effort,  Room air  CVS/Heart  regular rhythm, 2/6 systolic murmur  Abdomen:   Soft, nontender  Extremities:  No peripheral edema  Neurologic:  Alert, oriented  Skin:  No acute rashes  Access:  Left arm AV graft       Basic Metabolic Panel:  Recent Labs  Lab 04/02/18 0749 04/03/18 1016 04/03/18 1353  NA 134*  --  133*  K 6.0* 6.9* 5.6*  CL 94*  --  92*  CO2 25  --  29  GLUCOSE 186*  --  260*  BUN 89*  --  67*  CREATININE 13.81*  --  12.91*  CALCIUM 9.1  --  8.5*  PHOS 4.6  --  4.5     CBC: Recent Labs  Lab 04/02/18 0749  WBC 4.5  HGB 10.2*  HCT 30.3*  MCV 85.1  PLT 380      Lab Results  Component Value Date   HEPBSAG Negative  03/04/2015   HEPBSAB Reactive 03/04/2015      Microbiology:  No results found for this or any previous visit (from the past 240 hour(s)).  Coagulation Studies: No results for input(s): LABPROT, INR in the last 72 hours.  Urinalysis: No results for input(s): COLORURINE, LABSPEC, PHURINE, GLUCOSEU, HGBUR, BILIRUBINUR, KETONESUR, PROTEINUR, UROBILINOGEN, NITRITE, LEUKOCYTESUR in the last 72 hours.  Invalid input(s): APPERANCEUR    Imaging: No results found.   Medications:   . sodium chloride    . sodium chloride     . calcium acetate  2,001 mg Oral TID WC  . Chlorhexidine Gluconate Cloth  6 each Topical Q0600  . cinacalcet  90 mg Oral Q T,Th,Sa-HD  . FLUoxetine  20 mg Oral Daily  . insulin aspart  0-5 Units Subcutaneous TID WC  . insulin aspart  6 Units Subcutaneous TID WC  . insulin glargine  10 Units Subcutaneous BID  . isosorbide-hydrALAZINE  1 tablet Oral TID  . levETIRAcetam  250 mg Oral Q T,Th,Sat-1800  . levETIRAcetam  500 mg Oral Q0600  . minoxidil  5 mg Oral q1800  . multivitamin  1 tablet Oral QHS  . NIFEdipine  30 mg Oral BID  . risperiDONE  1 mg  Oral BID   sodium chloride, sodium chloride, acetaminophen, alteplase, alum & mag hydroxide-simeth, heparin, heparin, heparin, lidocaine (PF), lidocaine-prilocaine, pentafluoroprop-tetrafluoroeth  Assessment/ Plan:  38 y.o. African-American male with end stage renal disease on hemodialysis, depression, diabetes mellitus type I insulin dependent, and hypertension, who [facility]  TTS [facility] /Left AVG/88.5 kg  1.  End-stage renal disease 2.  Hyperkalemia 3.  Hypertension with chronic kidney disease 4.  Anemia of chronic kidney disease 5.  Secondary hyperparathyroidism  Dialysis today. [place] team considering ECT on MWF  Continue calcium acetate with meals and cinacalcet Minoxidil and nifedipine for hypertension We  will consider adding losartan for hypertension once potassium is better controlled     LOS: [place] 2/1/20201:18 PM  [place], [place]  Note: This note was prepared with Dragon dictation. Any transcription errors are unintentional

## 2018-04-07 NOTE — Plan of Care (Signed)
Pt. Denies si/hi/avh, can contract for safety. Pt. Monitored for safety on the unit.    Problem: Safety: Goal: Periods of time without injury will increase Outcome: Progressing   Problem: Self-Concept: Goal: Ability to disclose and discuss suicidal ideas will improve Outcome: Progressing   Problem: Safety: Goal: Ability to disclose and discuss suicidal ideas will improve Outcome: Progressing

## 2018-04-07 NOTE — BHH Group Notes (Signed)
LCSW Group Therapy Note   04/07/2018 1:15pm   Type of Therapy and Topic:  Group Therapy:  Trust and Honesty  Participation Level:  Did Not Attend  Description of Group:    In this group patients will be asked to explore the value of being honest.  Patients will be guided to discuss their thoughts, feelings, and behaviors related to honesty and trusting in others. Patients will process together how trust and honesty relate to forming relationships with peers, family members, and self. Each patient will be challenged to identify and express feelings of being vulnerable. Patients will discuss reasons why people are dishonest and identify alternative outcomes if one was truthful (to self or others). This group will be process-oriented, with patients participating in exploration of their own experiences, giving and receiving support, and processing challenge from other group members.   Therapeutic Goals: 1. Patient will identify why honesty is important to relationships and how honesty overall affects relationships.  2. Patient will identify a situation where they lied or were lied too and the  feelings, thought process, and behaviors surrounding the situation 3. Patient will identify the meaning of being vulnerable, how that feels, and how that correlates to being honest with self and others. 4. Patient will identify situations where they could have told the truth, but instead lied and explain reasons of dishonesty.   Summary of Patient Progress: Pt was invited to attend group but chose not to attend. CSW will continue to encourage pt to attend group throughout their admission.     Therapeutic Modalities:   Cognitive Behavioral Therapy Solution Focused Therapy Motivational Interviewing Brief Therapy  Samarie Pinder  CUEBAS-COLON, LCSW 04/07/2018 3:27 PM

## 2018-04-07 NOTE — Progress Notes (Signed)
Refused to get up for weight and vitals encouraged to get up x3 with no response from patient.

## 2018-04-07 NOTE — Progress Notes (Signed)
D: Pt denies SI/HI/AVH, can contract for safety. Pt is pleasant and cooperative, but endorses a depression mood during assessments. Pt. Affect is mostly flat and sad. Pt. Denies anxiety. Pt. When not at dialysis is mostly isolative and withdrawn to room. No behavioral concerns to report. Pt. Visited with family during the evening and his affect brightened up.   A: Q x 15 minute observation checks were completed for safety. Patient was provided with education. Patient was given/offered medications per orders. Patient  was encourage to attend groups, participate in unit activities and continue with plan of care. Pt. Chart and plans of care reviewed. Pt. Given support and encouragement.   R: Patient is complaint with medication and unit procedures. Pt. Had dialysis today. Pt. Weighed per MD orders. Pt. Blood sugars monitored per MD orders. Pt. Intake and output monitored. Pt. Eating good, attends all meals. Pt. Had a hypoglycemic event today. Hypoglycemia protocol followed and pt. Sugars brought back to WDL.             Precautionary checks every 15 minutes for safety maintained, room free of safety hazards, patient sustains no injury or falls during this shift. Will endorse care to next shift.

## 2018-04-07 NOTE — Progress Notes (Signed)
HD Pre assessment   04/07/18 1000  Neurological  Level of Consciousness Alert  Orientation Level Oriented X4  Respiratory  Respiratory Pattern Regular;Unlabored  Chest Assessment Chest expansion symmetrical  Bilateral Breath Sounds Clear  Cough None  Cardiac  Pulse Regular  Heart Sounds S1, S2  Vascular  Edema Generalized  Additional Vascular Comments +B/T  Integumentary  Integumentary (WDL) WDL  Gastrointestinal  Bowel Sounds Assessment Active  Psychosocial  Patient Behaviors Cooperative;Calm  Needs Expressed Denies

## 2018-04-07 NOTE — BHH Group Notes (Signed)
[place] Group Notes:  (Nursing/MHT/Case Management/Adjunct)  Date:  04/07/2018  Time:  9:01 PM  Type of Therapy:  Group Therapy  Participation Level:  Did Not Attend   Barnie Mort 04/07/2018, 9:01 PM

## 2018-04-07 NOTE — Progress Notes (Signed)
D: Patient has been isolative to room. Denies SI, HI and AV hallucinations.  Affect is flat. Mood is sad. Did not come up to get snack at hs. HS CBG 145.  A: Continue to monitor and offer support. R: Safety maintained

## 2018-04-07 NOTE — Progress Notes (Signed)
Patient arrived back on the unit from dialysis at this time.

## 2018-04-08 LAB — GLUCOSE, CAPILLARY
Glucose-Capillary: 55 mg/dL — ABNORMAL LOW (ref 70–99)
Glucose-Capillary: 70 mg/dL (ref 70–99)
Glucose-Capillary: 219 mg/dL — ABNORMAL HIGH (ref 70–99)
Glucose-Capillary: 237 mg/dL — ABNORMAL HIGH (ref 70–99)

## 2018-04-08 NOTE — Plan of Care (Signed)
D: Patient awake and sitting up in room. Affect and mood are sad. Patient eating hs snack. Denies SI, HI and AV hallucinations. Voices no complaints. Denies pain. No signs or symptoms of hypoglycemia A: Continue to monitor for safety and offer support. R: Safety maintained.

## 2018-04-08 NOTE — Progress Notes (Signed)
Hypoglycemic Event  CBG: 55  Treatment: 8 oz juice/soda  Symptoms: None  Follow-up CBG: [id number]: CBG Result:70  Possible Reasons for Event: Inadequate meal intake  Comments/MD notified:Dr. Alfredo Bach, Laurence Spates

## 2018-04-08 NOTE — Progress Notes (Signed)
D: Patient awake and sitting up in room. Affect and mood are sad. Patient eating hs snack. Denies SI, HI and AV hallucinations. Voices no complaints. Denies pain. No signs or symptoms of hypoglycemia A: Continue to monitor for safety and offer support. R: Safety maintained.

## 2018-04-08 NOTE — BHH Group Notes (Signed)
LCSW Group Therapy Note 04/08/2018 1:15pm  Type of Therapy and Topic: Group Therapy: Feelings Around Returning Home & Establishing a Supportive Framework and Supporting Oneself When Supports Not Available  Participation Level: Active  Description of Group:  Patients first processed thoughts and feelings about upcoming discharge. These included fears of upcoming changes, lack of change, new living environments, judgements and expectations from others and overall stigma of mental health issues. The group then discussed the definition of a supportive framework, what that looks and feels like, and how do to discern it from an unhealthy non-supportive network. The group identified different types of supports as well as what to do when your family/friends are less than helpful or unavailable  Therapeutic Goals  1. Patient will identify one healthy supportive network that they can use at discharge. 2. Patient will identify one factor of a supportive framework and how to tell it from an unhealthy network. 3. Patient able to identify one coping skill to use when they do not have positive supports from others. 4. Patient will demonstrate ability to communicate their needs through discussion and/or role plays.  Summary of Patient Progress:  The patient reported he feel "really good." Pt engaged during group session. As patients processed their anxiety about discharge and described healthy supports patient shared he is looking forward to be discharge. Patients identified at least one self-care tool they were willing to use after discharge.   Therapeutic Modalities Cognitive Behavioral Therapy Motivational Interviewing   Lucresia Simic  CUEBAS-COLON, LCSW 04/08/2018 9:44 AM

## 2018-04-08 NOTE — Plan of Care (Signed)
Pt. Denies si/hi/avh, can contract for safety. Pt. Affect brighter today overall. Pt. Monitored for safety per MD orders.    Problem: Safety: Goal: Periods of time without injury will increase Outcome: Progressing   Problem: Self-Concept: Goal: Ability to disclose and discuss suicidal ideas will improve Outcome: Progressing   Problem: Safety: Goal: Ability to disclose and discuss suicidal ideas will improve Outcome: Progressing

## 2018-04-08 NOTE — Progress Notes (Signed)
[facility] MD Progress Note  04/08/2018 8:55 AM Edward Colon  MRN:  [id number] Subjective: Today he states that he is thinking about getting back out to living his usual life.  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  He states he is starting to feel more like his usual self and has no suicide thoughts today.  He is coming out of his room more participating group.  He ate breakfast.  Blood sugar low again this morning, 55,.  He states this happens frequently and he does not feel the low blood sugar has been done even into the teens at times.  He states he cannot predict when his blood sugar will be low he does check it frequently and respond accordingly.  He has been on dialysis for 3 years now sometimes his blood sugar will drop low during dialysis.  He denies any side effect of medications and slept fairly well last night.  If demonstrates continued improvement consider tapering risperidone.  Principal Problem: MDD (major depressive disorder), recurrent severe, without psychosis ([place]) Diagnosis: Principal Problem:   MDD (major depressive disorder), recurrent severe, without psychosis ([place]) Active Problems:   Seizures ([place])   Hypertension   Type 1 diabetes ([place])   Acute on chronic renal failure ([place])  Total Time spent with patient: 20 minutes  Past Psychiatric History: Depression for several years but no treatment  Past Medical History:  Past Medical History:  Diagnosis Date  . Anemia   . Blind left eye since ~ 2010  . Depression   . Diabetic peripheral neuropathy ([place])   . ESRD (end stage renal disease) on dialysis [facility])    "TTS; [place] [facility]; [facility]" (02/01/2016)  . Heart murmur    denies any problems with it  . Hypertension   . Seizures ([place])    "last one was end of 2016; they are related to my diabetes" (02/01/2016)  . Type 1 diabetes ([place]) dx'd 1990    Past Surgical History:  Procedure Laterality Date  . AV FISTULA PLACEMENT  Right 03/03/2015   Procedure: RIGHT RADIO-CEPHALIC ARTERIOVENOUS (AV) FISTULA CREATION;  Surgeon: Serafina Mitchell, MD;  Location: [facility] OR;  Service: Vascular;  Laterality: Right;  . AV FISTULA PLACEMENT Left 06/15/2015   Procedure: INSERTION OF LEFT UPPER ARM  ARTERIOVENOUS (AV) 57mm x 50cm GORE-TEX GRAFT;  Surgeon: Elam Dutch, MD;  Location: [place];  Service: Vascular;  Laterality: Left;  . BASCILIC VEIN TRANSPOSITION Left 04/01/2015   Procedure: BASCILIC VEIN TRANSPOSITION-LEFT ARM- FIRST STAGE;  Surgeon: Elam Dutch, MD;  Location: [place];  Service: Vascular;  Laterality: Left;  . EYE SURGERY Right ~ 2010   for diabetic retinopathy  . INSERTION OF DIALYSIS CATHETER Right 03/03/2015   Procedure: INSERTION OF DIALYSIS CATHETER;  Surgeon: Serafina Mitchell, MD;  Location: [facility] OR;  Service: Vascular;  Laterality: Right;   Family History:  Family History  Problem Relation Age of Onset  . Diabetes Neg Hx    Family Psychiatric  History: see prior Social History:  Social History   Substance and Sexual Activity  Alcohol Use No  . Frequency: Never   Comment: 02/01/2016 "might have 1 drink/year"     Social History   Substance and Sexual Activity  Drug Use No    Social History   Socioeconomic History  . Marital status: Single    Spouse name: Not on file  . Number of children: Not on file  .  Years of education: Not on file  . Highest education level: Not on file  Occupational History  . Not on file  Social Needs  . Financial resource strain: Not on file  . Food insecurity:    Worry: Not on file    Inability: Not on file  . Transportation needs:    Medical: Not on file    Non-medical: Not on file  Tobacco Use  . Smoking status: Never Smoker  . Smokeless tobacco: Never Used  Substance and Sexual Activity  . Alcohol use: No    Frequency: Never    Comment: 02/01/2016 "might have 1 drink/year"  . Drug use: No  . Sexual activity: Yes  Lifestyle  . Physical activity:     Days per week: Not on file    Minutes per session: Not on file  . Stress: Not on file  Relationships  . Social connections:    Talks on phone: Not on file    Gets together: Not on file    Attends religious service: Not on file    Active member of club or organization: Not on file    Attends meetings of clubs or organizations: Not on file    Relationship status: Not on file  Other Topics Concern  . Not on file  Social History Narrative  . Not on file   Additional Social History:                         Sleep: Fair  Appetite:  Fair  Current Medications: Current Facility-Administered Medications  Medication Dose Route Frequency Provider Last Rate Last Dose  . 0.9 %  sodium chloride infusion  100 mL Intravenous PRN Alric Seton, PA-C      . 0.9 %  sodium chloride infusion  100 mL Intravenous PRN Alric Seton, PA-C      . acetaminophen (TYLENOL) tablet 650 mg  650 mg Oral Q4H PRN [name], MD      . alteplase (CATHFLO ACTIVASE) injection 2 mg  2 mg Intracatheter Once PRN Alric Seton, PA-C      . alum & mag hydroxide-simeth (MAALOX/MYLANTA) 200-200-20 MG/5ML suspension 30 mL  30 mL Oral Q4H PRN [name], MD      . calcium acetate (PHOSLO) capsule 2,001 mg  2,001 mg Oral TID WC [name], MD   2,001 mg at 04/08/18 0753  . Chlorhexidine Gluconate Cloth 2 % PADS 6 each  6 each Topical Q0600 [name], MD   6 each at 04/08/18 0602  . cinacalcet (SENSIPAR) tablet 90 mg  90 mg Oral Q T,Th,Sa-HD [name], MD   90 mg at 04/07/18 1444  . FLUoxetine (PROZAC) capsule 20 mg  20 mg Oral Daily [name], MD   20 mg at 04/08/18 4709  . heparin injection 1,000 Units  1,000 Units Dialysis PRN Alric Seton, PA-C      . heparin injection 1,700 Units  20 Units/kg Dialysis PRN Alric Seton, PA-C      . heparin injection 4,300 Units  50 Units/kg Dialysis PRN Kolluru, Sarath, MD      . insulin aspart (novoLOG) injection 0-5 Units  0-5  Units Subcutaneous TID WC [name], [name], MD   3 Units at 04/07/18 1731  . insulin aspart (novoLOG) injection 6 Units  6 Units Subcutaneous TID WC [name], [name], MD   6 Units at 04/08/18 [phone]  . insulin glargine (LANTUS) injection 10  Units  10 Units Subcutaneous BID [name], [name], MD   10 Units at 04/08/18 [phone]  . isosorbide-hydrALAZINE (BIDIL) 20-37.5 MG per tablet 1 tablet  1 tablet Oral TID [name], MD   1 tablet at 04/08/18 [phone]  . levETIRAcetam (KEPPRA) tablet 250 mg  250 mg Oral Q T,Th,Sat-1800 [name], MD   250 mg at 04/07/18 1732  . levETIRAcetam (KEPPRA) tablet 500 mg  500 mg Oral Q0600 [name], MD   500 mg at 04/08/18 0559  . lidocaine (PF) (XYLOCAINE) 1 % injection 5 mL  5 mL Intradermal PRN Alric Seton, PA-C      . lidocaine-prilocaine (EMLA) cream 1 application  1 application Topical PRN Alric Seton, PA-C      . minoxidil (LONITEN) tablet 5 mg  5 mg Oral q1800 [name], [name], MD   5 mg at 04/07/18 1732  . multivitamin (RENA-VIT) tablet 1 tablet  1 tablet Oral QHS [name], MD   1 tablet at 04/07/18 2124  . NIFEdipine (PROCARDIA-XL/NIFEDICAL-XL) 24 hr tablet 30 mg  30 mg Oral BID [name], MD   30 mg at 04/08/18 0753  . pentafluoroprop-tetrafluoroeth (GEBAUERS) aerosol 1 application  1 application Topical PRN Alric Seton, PA-C      . risperiDONE (RISPERDAL M-TABS) disintegrating tablet 1 mg  1 mg Oral BID [name], [name], MD   1 mg at 04/08/18 8841    Lab Results:  Results for orders placed or performed during the hospital encounter of 03/28/18 (from the past 48 hour(s))  Glucose, capillary     Status: Abnormal   Collection Time: 04/06/18 11:17 AM  Result Value Ref Range   Glucose-Capillary 399 (H) 70 - 99 mg/dL  Glucose, capillary     Status: Abnormal   Collection Time: 04/06/18  4:13 PM  Result Value Ref Range   Glucose-Capillary 243 (H) 70 - 99 mg/dL   Comment 1 Notify RN   Glucose, capillary     Status: Abnormal    Collection Time: 04/06/18  8:55 PM  Result Value Ref Range   Glucose-Capillary 197 (H) 70 - 99 mg/dL   Comment 1 Notify RN   Glucose, capillary     Status: Abnormal   Collection Time: 04/07/18  7:06 AM  Result Value Ref Range   Glucose-Capillary 244 (H) 70 - 99 mg/dL   Comment 1 Notify RN   Glucose, capillary     Status: Abnormal   Collection Time: 04/07/18  2:23 PM  Result Value Ref Range   Glucose-Capillary 33 (LL) 70 - 99 mg/dL   Comment 1 Call MD NNP PA CNM   Glucose, capillary     Status: Abnormal   Collection Time: 04/07/18  2:39 PM  Result Value Ref Range   Glucose-Capillary 35 (LL) 70 - 99 mg/dL   Comment 1 Notify RN   Glucose, capillary     Status: None   Collection Time: 04/07/18  2:54 PM  Result Value Ref Range   Glucose-Capillary 87 70 - 99 mg/dL  Glucose, capillary     Status: Abnormal   Collection Time: 04/07/18  4:22 PM  Result Value Ref Range   Glucose-Capillary 268 (H) 70 - 99 mg/dL  Glucose, capillary     Status: Abnormal   Collection Time: 04/07/18  8:09 PM  Result Value Ref Range   Glucose-Capillary 145 (H) 70 - 99 mg/dL   Comment 1 Notify RN   Glucose, capillary     Status:  Abnormal   Collection Time: 04/08/18  7:02 AM  Result Value Ref Range   Glucose-Capillary 55 (L) 70 - 99 mg/dL   Comment 1 Notify RN   Glucose, capillary     Status: None   Collection Time: 04/08/18  7:18 AM  Result Value Ref Range   Glucose-Capillary 70 70 - 99 mg/dL   Comment 1 Notify RN     Blood Alcohol level:  Lab Results  Component Value Date   ETH <10 03/12/2018   ETH <10 01/65/5374    Metabolic Disorder Labs: Lab Results  Component Value Date   HGBA1C 9.1 (H) 02/13/2018   MPG 214.47 02/13/2018   MPG 249 08/31/2014   No results found for: PROLACTIN Lab Results  Component Value Date   TRIG 221 (H) 02/10/2018    Physical Findings: AIMS:  , ,  ,  ,    CIWA:    COWS:     Musculoskeletal: Strength & Muscle Tone: within normal limits Gait & Station:  normal Patient leans: N/A  Psychiatric Specialty Exam: Physical Exam  ROS  Blood pressure 129/79, pulse 99, temperature 98.8 F (37.1 C), temperature source Oral, resp. rate 18, height 6' (1.829 m), weight 91.2 kg, SpO2 100 %.Body mass index is 27.26 kg/m.  General Appearance: Fairly Groomed  Eye Contact:  Fair  Speech:  Slow but improving some  Volume:  Decreased  Mood:  Depressed  Affect:  Congruent  Thought Process:  Coherent  Orientation:  Full (Time, Place, and Person)  Thought Content:  focused on health and prolonged depression in bed prior to admission  Suicidal Thoughts:  No  Homicidal Thoughts:  No  Memory:  Immediate;   Fair  Judgement:  Fair  Insight:  Shallow  Psychomotor Activity:  moving a bit less slowly, more interactive  Concentration:  Concentration: Fair and Attention Span: Fair  Recall:  Fair can't recall blood sugar just taken by nurse  [employer] of Knowledge:  Fair  Language:  Good  Akathisia:  No  Handed:  Right  AIMS (if indicated):     Assets:  Communication Skills Desire for Improvement Social Support  ADL's:  Intact  Cognition:  Impaired,  Mild  Sleep:  Number of Hours: 7.45     Treatment Plan Summary: Daily contact with patient to assess and evaluate symptoms and progress in treatment, Medication management and Plan He is becoming more interactive beginning to respond to the milieu more effectively.  Continue Prozac consider tapering Risperdal either while inpatient or as an outpatient  Patience Musca, MD 04/08/2018, 8:55 AM

## 2018-04-08 NOTE — Progress Notes (Signed)
D:Pt denies SI/HI/AVH, can contract for safety. Pt. Endorses a mostly normal mood. Pt. Affect brighter overall today. Pt. Resting a lot in his room isolative and withdrawn, but periodically out in the milieu observed. Pt. Did go to a couple groups, which is an improvement. Pt. Interactions with staff and peers is appropriate.   A: Q x 15 minute observation checks were completed for safety. Patient was provided with education, but needs reinforcement.Patient was given/offered medications per orders. Patient was encourage to attend groups, participate in unit activities and continue with plan of care. Pt. Chart and plans of care reviewed. Pt. Given support and encouragement.  R:Patient is complaint with medication and unit procedures. Pt. Weighed per MD orders. Pt. Blood sugars monitored per MD orders. Pt. Intake and output monitored. Pt. Eating good, attends all meals. Pt. Had a hypoglycemic event today early in the morning. Hypoglycemia protocol followed and pt. Sugars brought back to WDL. MD notified of low sugar event. No behavioral concerns to report, but mild spells of bizarre expressions observed at times, but patient seems to recognize this bizarreness in his presentation and points it out.             Precautionary checks every 15 minutes for safety maintained, room free of safety hazards, patient sustains no injury or falls during this shift. Will endorse care to next shift.

## 2018-04-09 LAB — GLUCOSE, CAPILLARY
GLUCOSE-CAPILLARY: 140 mg/dL — AB (ref 70–99)
GLUCOSE-CAPILLARY: 64 mg/dL — AB (ref 70–99)
Glucose-Capillary: 221 mg/dL — ABNORMAL HIGH (ref 70–99)
Glucose-Capillary: 23 mg/dL — CL (ref 70–99)
Glucose-Capillary: 98 mg/dL (ref 70–99)
Glucose-Capillary: 163 mg/dL — ABNORMAL HIGH (ref 70–99)
Glucose-Capillary: 262 mg/dL — ABNORMAL HIGH (ref 70–99)

## 2018-04-09 NOTE — Plan of Care (Signed)
Pt. Denies si/hi/avh, can contract for safety. Pt. Reports he can remain safe while on the unit and off. Pt. Monitored for safety.    Problem: Safety: Goal: Periods of time without injury will increase Outcome: Progressing   Problem: Self-Concept: Goal: Ability to disclose and discuss suicidal ideas will improve Outcome: Progressing

## 2018-04-09 NOTE — Plan of Care (Signed)
Patient slept till 12N.Patient was irritable and states "leave me alone" when called for this morning.But patient was pleasant and cooperative after lunch.Denies SI,HI and AVH.Blood sugar was 23 before dinner.Patient was alert and oriented.Patient had 8 OZ juice.Patient stated that he feels tired.Ate 80% of dinner.Blood sugar rechecked twice and came upto 98. Patient is positive for bilateral periorbital edema.No SOB  or other symptoms verbalized.

## 2018-04-09 NOTE — Tx Team (Signed)
Interdisciplinary Treatment and Diagnostic Plan Update  04/09/2018 Time of Session:  8:30am Edward Colon MRN: [id number]  Principal Diagnosis: MDD (major depressive disorder), recurrent severe, without psychosis ([place])  Secondary Diagnoses: Principal Problem:   MDD (major depressive disorder), recurrent severe, without psychosis ([place]) Active Problems:   Seizures ([place])   Hypertension   Type 1 diabetes ([place])   Acute on chronic renal failure ([place])   Current Medications:  Current Facility-Administered Medications  Medication Dose Route Frequency Provider Last Rate Last Dose  . 0.9 %  sodium chloride infusion  100 mL Intravenous PRN Alric Seton, PA-C      . 0.9 %  sodium chloride infusion  100 mL Intravenous PRN Alric Seton, PA-C      . acetaminophen (TYLENOL) tablet 650 mg  650 mg Oral Q4H PRN [name], MD      . alteplase (CATHFLO ACTIVASE) injection 2 mg  2 mg Intracatheter Once PRN Alric Seton, PA-C      . alum & mag hydroxide-simeth (MAALOX/MYLANTA) 200-200-20 MG/5ML suspension 30 mL  30 mL Oral Q4H PRN [name], MD      . calcium acetate (PHOSLO) capsule 2,001 mg  2,001 mg Oral TID WC [name], MD   2,001 mg at 04/08/18 1635  . Chlorhexidine Gluconate Cloth 2 % PADS 6 each  6 each Topical Q0600 [name], MD   6 each at 04/08/18 0602  . cinacalcet (SENSIPAR) tablet 90 mg  90 mg Oral Q T,Th,Sa-HD [name], MD   90 mg at 04/07/18 1444  . FLUoxetine (PROZAC) capsule 20 mg  20 mg Oral Daily [name], MD   20 mg at 04/08/18 6568  . heparin injection 1,000 Units  1,000 Units Dialysis PRN Alric Seton, PA-C      . heparin injection 1,700 Units  20 Units/kg Dialysis PRN Alric Seton, PA-C      . heparin injection 4,300 Units  50 Units/kg Dialysis PRN Kolluru, Sarath, MD      . insulin aspart (novoLOG) injection 0-5 Units  0-5 Units Subcutaneous TID WC [name], [name], MD   2 Units at 04/08/18 1641  . insulin aspart  (novoLOG) injection 6 Units  6 Units Subcutaneous TID WC [name], [name], MD   6 Units at 04/08/18 1653  . insulin glargine (LANTUS) injection 10 Units  10 Units Subcutaneous BID [name], [name], MD   10 Units at 04/08/18 1649  . isosorbide-hydrALAZINE (BIDIL) 20-37.5 MG per tablet 1 tablet  1 tablet Oral TID [name], MD   1 tablet at 04/08/18 1635  . levETIRAcetam (KEPPRA) tablet 250 mg  250 mg Oral Q T,Th,Sat-1800 [name], MD   250 mg at 04/07/18 1732  . levETIRAcetam (KEPPRA) tablet 500 mg  500 mg Oral Q0600 [name], MD   500 mg at 04/09/18 0559  . lidocaine (PF) (XYLOCAINE) 1 % injection 5 mL  5 mL Intradermal PRN Alric Seton, PA-C      . lidocaine-prilocaine (EMLA) cream 1 application  1 application Topical PRN Alric Seton, PA-C      . minoxidil (LONITEN) tablet 5 mg  5 mg Oral q1800 [name], [name], MD   5 mg at 04/08/18 1720  . multivitamin (RENA-VIT) tablet 1 tablet  1 tablet Oral QHS [name], MD   1 tablet at 04/08/18 2122  . NIFEdipine (PROCARDIA-XL/NIFEDICAL-XL) 24 hr tablet 30 mg  30 mg Oral BID [name], MD  30 mg at 04/08/18 1635  . pentafluoroprop-tetrafluoroeth (GEBAUERS) aerosol 1 application  1 application Topical PRN Alric Seton, PA-C      . risperiDONE (RISPERDAL M-TABS) disintegrating tablet 1 mg  1 mg Oral BID [name], [name], MD   1 mg at 04/08/18 1635   PTA Medications: Medications Prior to Admission  Medication Sig Dispense Refill Last Dose  . calcium acetate (PHOSLO) 667 MG capsule Take 1 capsule (667 mg total) by mouth 3 (three) times daily with meals. (Patient taking differently: Take [phone] mg by mouth See admin instructions. Take 4 capsules (2668 mg) by mouth with meals and 2 capsules (1334 mg) with snacks) 90 capsule 0 03/11/2018 at Unknown time  . FLUoxetine (PROZAC) 20 MG capsule Take 1 capsule (20 mg total) by mouth daily.  3   . insulin aspart (NOVOLOG) 100 UNIT/ML injection Inject 0-9 Units into the skin  3 (three) times daily with meals for 30 days. For glucose 121 to 150 use one unit, for 151 to 200 use two units, for 201 to 250 use three units, for 251 to 300 use five units, for 301 to 350 use seven units, for 351 or greater use 9 units. 8.1 mL 0   . insulin glargine (LANTUS) 100 UNIT/ML injection Inject 0.14 mLs (14 Units total) into the skin every morning. 10 mL 11   . insulin glargine (LANTUS) 100 UNIT/ML injection Inject 0.08 mLs (8 Units total) into the skin at bedtime. 10 mL 11   . isosorbide-hydrALAZINE (BIDIL) 20-37.5 MG tablet Take 1 tablet by mouth 3 (three) times daily. 90 tablet 0   . levETIRAcetam (KEPPRA) 250 MG tablet Take 1 tablet (250 mg total) by mouth every Tuesday, Thursday, and Saturday at 6 PM for 30 days. 12 tablet 0   . levETIRAcetam (KEPPRA) 500 MG tablet Take 1 tablet (500 mg total) by mouth daily at 6 (six) AM for 30 days. 30 tablet 0   . minoxidil (LONITEN) 2.5 MG tablet Take 2.5 mg by mouth 2 (two) times daily.     . multivitamin (RENA-VIT) TABS tablet Take 1 tablet by mouth at bedtime. (Patient taking differently: Take 1 tablet by mouth daily. ) 30 tablet 0 02/08/2018 at Unknown time  . NIFEdipine (ADALAT CC) 30 MG 24 hr tablet Take 1 tablet (30 mg total) by mouth 2 (two) times daily. 60 tablet 0     Patient Stressors: Health problems Medication change or noncompliance  Patient Strengths: Ability for insight Motivation for treatment/growth  Treatment Modalities: Medication Management, Group therapy, Case management,  1 to 1 session with clinician, Psychoeducation, Recreational therapy.   Physician Treatment Plan for Primary Diagnosis: MDD (major depressive disorder), recurrent severe, without psychosis ([place]) Long Term Goal(s): Improvement in symptoms so as ready for discharge Improvement in symptoms so as ready for discharge   Short Term Goals: Ability to disclose and discuss suicidal ideas Ability to identify and develop effective coping behaviors will  improve  Medication Management: Evaluate patient's response, side effects, and tolerance of medication regimen.  Therapeutic Interventions: 1 to 1 sessions, Unit Group sessions and Medication administration.  Evaluation of Outcomes: Progressing  Physician Treatment Plan for Secondary Diagnosis: Principal Problem:   MDD (major depressive disorder), recurrent severe, without psychosis ([place]) Active Problems:   Seizures ([place])   Hypertension   Type 1 diabetes ([facility])   Acute on chronic renal failure ([facility])  Long Term Goal(s): Improvement in symptoms so as ready for discharge Improvement in symptoms so as ready for discharge  Short Term Goals: Ability to disclose and discuss suicidal ideas Ability to identify and develop effective coping behaviors will improve     Medication Management: Evaluate patient's response, side effects, and tolerance of medication regimen.  Therapeutic Interventions: 1 to 1 sessions, Unit Group sessions and Medication administration.  Evaluation of Outcomes: Progressing   RN Treatment Plan for Primary Diagnosis: MDD (major depressive disorder), recurrent severe, without psychosis ([place]) Long Term Goal(s): Knowledge of disease and therapeutic regimen to maintain health will improve  Short Term Goals: Ability to demonstrate self-control, Ability to verbalize feelings will improve, Ability to identify and develop effective coping behaviors will improve and Compliance with prescribed medications will improve  Medication Management: RN will administer medications as ordered by provider, will assess and evaluate patient's response and provide education to patient for prescribed medication. RN will report any adverse and/or side effects to prescribing provider.  Therapeutic Interventions: 1 on 1 counseling sessions, Psychoeducation, Medication administration, Evaluate responses to treatment, Monitor vital signs and CBGs as ordered, Perform/monitor CIWA, COWS, AIMS and  Fall Risk screenings as ordered, Perform wound care treatments as ordered.  Evaluation of Outcomes: Progressing   LCSW Treatment Plan for Primary Diagnosis: MDD (major depressive disorder), recurrent severe, without psychosis ([place]) Long Term Goal(s): Safe transition to appropriate next level of care at discharge, Engage patient in therapeutic group addressing interpersonal concerns.  Short Term Goals: Engage patient in aftercare planning with referrals and resources, Increase social support, Increase ability to appropriately verbalize feelings, Increase emotional regulation and Increase skills for wellness and recovery  Therapeutic Interventions: Assess for all discharge needs, 1 to 1 time with Social worker, Explore available resources and support systems, Assess for adequacy in community support network, Educate family and significant other(s) on suicide prevention, Complete Psychosocial Assessment, Interpersonal group therapy.  Evaluation of Outcomes: Progressing   Progress in Treatment: Attending groups: No. Participating in groups: No. Taking medication as prescribed: Yes. Toleration medication: Yes. Family/Significant other contact made: Yes, individual(s) contacted:  SPE completed with pt. He declined family contact.  Physician notes that he has spoken with patient's father, however, CSW has not. Patient understands diagnosis: Yes. Discussing patient identified problems/goals with staff: Yes. Medical problems stabilized or resolved: Yes. Denies suicidal/homicidal ideation: Yes. Issues/concerns per patient self-inventory: No. Other: none  New problem(s) identified: No, Describe:  none  New Short Term/Long Term Goal(s): medication management for mood stabilization; development of comprehensive mental wellness/sobriety plan.  Patient Goals:  "I really don't know".  Discharge Plan or Barriers: Pt reports no barriers.  Patient has declined referrals at this time.  CSW will check  again prior to the patient's discharge.   Reason for Continuation of Hospitalization: Depression Medical Issues Medication stabilization  Estimated Length of Stay: 3-5 days  Recreational Therapy: Patient Stressors: N/A  Patient Goal: Patient will engage in groups without prompting or encouragement from LRT x3 group sessions within 5 recreation therapy group sessions  Attendees: Patient: Edward Colon 04/09/2018 11:17 AM  Physician: Dr. Weber Cooks, MD 04/09/2018 11:17 AM  Nursing:  04/09/2018 11:17 AM  RN Care Manager: 04/09/2018 11:17 AM  Social Worker: Assunta Curtis, MSW, LCSW 04/09/2018 11:17 AM  Recreational Therapist:  04/09/2018 11:17 AM  Other:  04/09/2018 11:17 AM  Other:  04/09/2018 11:17 AM  Other: 04/09/2018 11:17 AM    Scribe for Treatment Team: Rozann Lesches, LCSW 04/09/2018 11:17 AM

## 2018-04-09 NOTE — [facility] Group Notes (Signed)
[place] Group Notes:  (Nursing/MHT/Case Management/Adjunct)  Date:  04/09/2018  Time:  4:29 PM  Type of Therapy:  Psychoeducational Skills  Participation Level:  Active  Participation Quality:  Appropriate  Affect:  Appropriate  Cognitive:  Appropriate  Insight:  Appropriate  Engagement in Group:  Engaged  Modes of Intervention:  Discussion, Education and Support  Summary of Progress/Problems:  [facility] 04/09/2018, 4:29 PM

## 2018-04-09 NOTE — Progress Notes (Signed)
Patient ID: Edward Colon, male   DOB: Jul 20, 1980, 38 y.o.   MRN: [id number] PER STATE REGULATIONS 482.30  THIS CHART WAS REVIEWED FOR MEDICAL NECESSITY WITH RESPECT TO THE PATIENT'S ADMISSION/ DURATION OF STAY.  NEXT REVIEW DATE: 04/13/2018  Chauncy Lean, RN, BSN CASE MANAGER

## 2018-04-09 NOTE — [facility] Group Notes (Signed)
Overcoming Obstacles  04/09/2018 1PM  Type of Therapy and Topic:  Group Therapy:  Overcoming Obstacles  Participation Level:  Active    Description of Group:    In this group patients will be encouraged to explore what they see as obstacles to their own wellness and recovery. They will be guided to discuss their thoughts, feelings, and behaviors related to these obstacles. The group will process together ways to cope with barriers, with attention given to specific choices patients can make. Each patient will be challenged to identify changes they are motivated to make in order to overcome their obstacles. This group will be process-oriented, with patients participating in exploration of their own experiences as well as giving and receiving support and challenge from other group members.   Therapeutic Goals: 1. Patient will identify personal and current obstacles as they relate to admission. 2. Patient will identify barriers that currently interfere with their wellness or overcoming obstacles.  3. Patient will identify feelings, thought process and behaviors related to these barriers. 4. Patient will identify two changes they are willing to make to overcome these obstacles:      Summary of Patient Progress Actively and appropriately engaged in the group. Patient was able to provide support and validation to other group members.Patient practiced active listening when interacting with the facilitator and other group members. After meeting, pt copied notes discussed onto his notepad and said he plans to use interventions discussed to assist him with overcoming obstacles. Patient demonstrated good insight and provided personal examples of his struggle with overcoming obstacles.     Therapeutic Modalities:   Cognitive Behavioral Therapy Solution Focused Therapy Motivational Interviewing Relapse Prevention Therapy    Sanjuana Kava, MSW, [place] 04/09/2018 2:43 PM

## 2018-04-09 NOTE — Progress Notes (Signed)
Precautionary checks every 15 minutes for safety maintained, room free of safety hazards, patient sustains no injury or falls during this shift. Will endorse care to next shift.   

## 2018-04-09 NOTE — Progress Notes (Signed)
Recreation Therapy Notes  Date: 04/09/2018  Time: 9:30 am   Location: Craft room   Behavioral response: N/A   Intervention Topic: Relaxation  Discussion/Intervention: Patient did not attend group.   Clinical Observations/Feedback:  Patient did not attend group.   Breylon Sherrow LRT/CTRS        Shanaiya Bene 04/09/2018 11:14 AM

## 2018-04-09 NOTE — Progress Notes (Signed)
[facility] MD Progress Note  04/09/2018 3:48 PM Edward Colon  MRN:  [id number] Subjective: Follow-up patient with depression.  Patient appears much better today.  He is attending groups up out of his bed interacting with others.  Denies any current suicidal thoughts.  Denies psychotic symptoms. Principal Problem: MDD (major depressive disorder), recurrent severe, without psychosis ([place]) Diagnosis: Principal Problem:   MDD (major depressive disorder), recurrent severe, without psychosis ([place]) Active Problems:   Seizures ([place])   Hypertension   Type 1 diabetes ([place])   Acute on chronic renal failure ([place])  Total Time spent with patient: 20 minutes  Past Psychiatric History: Past history of depression that has been worsening for a couple years  Past Medical History:  Past Medical History:  Diagnosis Date  . Anemia   . Blind left eye since ~ 2010  . Depression   . Diabetic peripheral neuropathy ([place])   . ESRD (end stage renal disease) on dialysis [facility])    "TTS; [place]; [facility]" (02/01/2016)  . Heart murmur    denies any problems with it  . Hypertension   . Seizures ([place])    "last one was end of 2016; they are related to my diabetes" (02/01/2016)  . Type 1 diabetes ([place]) dx'd 1990    Past Surgical History:  Procedure Laterality Date  . AV FISTULA PLACEMENT Right 03/03/2015   Procedure: RIGHT RADIO-CEPHALIC ARTERIOVENOUS (AV) FISTULA CREATION;  Surgeon: Serafina Mitchell, MD;  Location: [facility] OR;  Service: Vascular;  Laterality: Right;  . AV FISTULA PLACEMENT Left 06/15/2015   Procedure: INSERTION OF LEFT UPPER ARM  ARTERIOVENOUS (AV) 71mm x 50cm GORE-TEX GRAFT;  Surgeon: Elam Dutch, MD;  Location: [place];  Service: Vascular;  Laterality: Left;  . BASCILIC VEIN TRANSPOSITION Left 04/01/2015   Procedure: BASCILIC VEIN TRANSPOSITION-LEFT ARM- FIRST STAGE;  Surgeon: Elam Dutch, MD;  Location: [place];  Service: Vascular;  Laterality: Left;  . EYE SURGERY Right ~ 2010   for  diabetic retinopathy  . INSERTION OF DIALYSIS CATHETER Right 03/03/2015   Procedure: INSERTION OF DIALYSIS CATHETER;  Surgeon: Serafina Mitchell, MD;  Location: [facility] OR;  Service: Vascular;  Laterality: Right;   Family History:  Family History  Problem Relation Age of Onset  . Diabetes Neg Hx    Family Psychiatric  History: Family history of depression Social History:  Social History   Substance and Sexual Activity  Alcohol Use No  . Frequency: Never   Comment: 02/01/2016 "might have 1 drink/year"     Social History   Substance and Sexual Activity  Drug Use No    Social History   Socioeconomic History  . Marital status: Single    Spouse name: Not on file  . Number of children: Not on file  . Years of education: Not on file  . Highest education level: Not on file  Occupational History  . Not on file  Social Needs  . Financial resource strain: Not on file  . Food insecurity:    Worry: Not on file    Inability: Not on file  . Transportation needs:    Medical: Not on file    Non-medical: Not on file  Tobacco Use  . Smoking status: Never Smoker  . Smokeless tobacco: Never Used  Substance and Sexual Activity  . Alcohol use: No    Frequency: Never    Comment: 02/01/2016 "might have 1 drink/year"  . Drug use: No  . Sexual activity: Yes  Lifestyle  .  Physical activity:    Days per week: Not on file    Minutes per session: Not on file  . Stress: Not on file  Relationships  . Social connections:    Talks on phone: Not on file    Gets together: Not on file    Attends religious service: Not on file    Active member of club or organization: Not on file    Attends meetings of clubs or organizations: Not on file    Relationship status: Not on file  Other Topics Concern  . Not on file  Social History Narrative  . Not on file   Additional Social History:                         Sleep: Fair  Appetite:  Fair  Current Medications: Current  Facility-Administered Medications  Medication Dose Route Frequency Provider Last Rate Last Dose  . 0.9 %  sodium chloride infusion  100 mL Intravenous PRN Alric Seton, PA-C      . 0.9 %  sodium chloride infusion  100 mL Intravenous PRN Alric Seton, PA-C      . acetaminophen (TYLENOL) tablet 650 mg  650 mg Oral Q4H PRN [name], MD      . alteplase (CATHFLO ACTIVASE) injection 2 mg  2 mg Intracatheter Once PRN Alric Seton, PA-C      . alum & mag hydroxide-simeth (MAALOX/MYLANTA) 200-200-20 MG/5ML suspension 30 mL  30 mL Oral Q4H PRN [name], MD      . calcium acetate (PHOSLO) capsule 2,001 mg  2,001 mg Oral TID WC [name], MD   2,001 mg at 04/09/18 1210  . Chlorhexidine Gluconate Cloth 2 % PADS 6 each  6 each Topical Q0600 [name], MD   6 each at 04/08/18 0602  . cinacalcet (SENSIPAR) tablet 90 mg  90 mg Oral Q T,Th,Sa-HD [name], MD   90 mg at 04/07/18 1444  . FLUoxetine (PROZAC) capsule 20 mg  20 mg Oral Daily [name], MD   20 mg at 04/09/18 1200  . heparin injection 1,000 Units  1,000 Units Dialysis PRN Alric Seton, PA-C      . heparin injection 1,700 Units  20 Units/kg Dialysis PRN Alric Seton, PA-C      . heparin injection 4,300 Units  50 Units/kg Dialysis PRN Kolluru, Sarath, MD      . insulin aspart (novoLOG) injection 0-5 Units  0-5 Units Subcutaneous TID WC Daevon Holdren, [name], MD   2 Units at 04/08/18 1641  . insulin aspart (novoLOG) injection 6 Units  6 Units Subcutaneous TID WC Ranada Vigorito, [name], MD   6 Units at 04/09/18 1200  . insulin glargine (LANTUS) injection 10 Units  10 Units Subcutaneous BID Lyriq Finerty, [name], MD   10 Units at 04/08/18 1649  . isosorbide-hydrALAZINE (BIDIL) 20-37.5 MG per tablet 1 tablet  1 tablet Oral TID [name], MD   1 tablet at 04/09/18 1250  . levETIRAcetam (KEPPRA) tablet 250 mg  250 mg Oral Q T,Th,Sat-1800 [name], MD   250 mg at 04/07/18 1732  . levETIRAcetam (KEPPRA)  tablet 500 mg  500 mg Oral Q0600 [name], MD   500 mg at 04/09/18 0559  . lidocaine (PF) (XYLOCAINE) 1 % injection 5 mL  5 mL Intradermal PRN Alric Seton, PA-C      . lidocaine-prilocaine (EMLA) cream 1 application  1 application  Topical PRN Alric Seton, PA-C      . minoxidil (LONITEN) tablet 5 mg  5 mg Oral q1800 Kyuss Hale, [name], MD   5 mg at 04/08/18 1720  . multivitamin (RENA-VIT) tablet 1 tablet  1 tablet Oral QHS [name], MD   1 tablet at 04/08/18 2122  . NIFEdipine (PROCARDIA-XL/NIFEDICAL-XL) 24 hr tablet 30 mg  30 mg Oral BID [name], MD   30 mg at 04/08/18 1635  . pentafluoroprop-tetrafluoroeth (GEBAUERS) aerosol 1 application  1 application Topical PRN Alric Seton, PA-C      . risperiDONE (RISPERDAL M-TABS) disintegrating tablet 1 mg  1 mg Oral BID Cu[place]utemoc Attar T, MD   1 mg at 04/08/18 1635    Lab Results:  Results for orders placed or performed during the hospital encounter of 03/28/18 (from the past 48 hour(s))  Glucose, capillary     Status: Abnormal   Collection Time: 04/07/18  4:22 PM  Result Value Ref Range   Glucose-Capillary 268 (H) 70 - 99 mg/dL  Glucose, capillary     Status: Abnormal   Collection Time: 04/07/18  8:09 PM  Result Value Ref Range   Glucose-Capillary 145 (H) 70 - 99 mg/dL   Comment 1 Notify RN   Glucose, capillary     Status: Abnormal   Collection Time: 04/08/18  7:02 AM  Result Value Ref Range   Glucose-Capillary 55 (L) 70 - 99 mg/dL   Comment 1 Notify RN   Glucose, capillary     Status: None   Collection Time: 04/08/18  7:18 AM  Result Value Ref Range   Glucose-Capillary 70 70 - 99 mg/dL   Comment 1 Notify RN   Glucose, capillary     Status: Abnormal   Collection Time: 04/08/18 11:42 AM  Result Value Ref Range   Glucose-Capillary 219 (H) 70 - 99 mg/dL  Glucose, capillary     Status: Abnormal   Collection Time: 04/08/18  4:31 PM  Result Value Ref Range   Glucose-Capillary 237 (H) 70 - 99 mg/dL  Glucose,  capillary     Status: Abnormal   Collection Time: 04/09/18  6:59 AM  Result Value Ref Range   Glucose-Capillary 221 (H) 70 - 99 mg/dL  Glucose, capillary     Status: Abnormal   Collection Time: 04/09/18 11:26 AM  Result Value Ref Range   Glucose-Capillary 140 (H) 70 - 99 mg/dL   Comment 1 Notify RN     Blood Alcohol level:  Lab Results  Component Value Date   ETH <10 03/12/2018   ETH <10 61/44/3154    Metabolic Disorder Labs: Lab Results  Component Value Date   HGBA1C 9.1 (H) 02/13/2018   MPG 214.47 02/13/2018   MPG 249 08/31/2014   No results found for: PROLACTIN Lab Results  Component Value Date   TRIG 221 (H) 02/10/2018    Physical Findings: AIMS:  , ,  ,  ,    CIWA:    COWS:     Musculoskeletal: Strength & Muscle Tone: within normal limits Gait & Station: normal Patient leans: N/A  Psychiatric Specialty Exam: Physical Exam  Nursing note and vitals reviewed. Constitutional: He appears well-developed and well-nourished.  HENT:  Head: Normocephalic and atraumatic.  Eyes: Pupils are equal, round, and reactive to light. Conjunctivae are normal.  Neck: Normal range of motion.  Cardiovascular: Regular rhythm and normal heart sounds.  Respiratory: Effort normal. No respiratory distress.  GI: Soft.  Musculoskeletal: Normal range of motion.  Neurological: He is alert.  Skin: Skin is warm and dry.  Psychiatric: He has a normal mood and affect. His behavior is normal. Judgment and thought content normal.    Review of Systems  Constitutional: Negative.   HENT: Negative.   Eyes: Negative.   Respiratory: Negative.   Cardiovascular: Negative.   Gastrointestinal: Negative.   Musculoskeletal: Negative.   Skin: Negative.   Neurological: Negative.   Psychiatric/Behavioral: Negative.     Blood pressure (!) 153/88, pulse 99, temperature 98.8 F (37.1 C), temperature source Oral, resp. rate 18, height 6' (1.829 m), weight 93.4 kg, SpO2 100 %.Body mass index is 27.94  kg/m.  General Appearance: Casual  Eye Contact:  Good  Speech:  Clear and Coherent  Volume:  Normal  Mood:  Euthymic  Affect:  Constricted  Thought Process:  Coherent  Orientation:  Full (Time, Place, and Person)  Thought Content:  Logical  Suicidal Thoughts:  No  Homicidal Thoughts:  No  Memory:  Immediate;   Fair Recent;   Fair Remote;   Fair  Judgement:  Fair  Insight:  Fair  Psychomotor Activity:  Decreased  Concentration:  Concentration: Fair  Recall:  [employer] of Knowledge:  Fair  Language:  Fair  Akathisia:  No  Handed:  Right  AIMS (if indicated):     Assets:  Desire for Improvement  ADL's:  Intact  Cognition:  WNL  Sleep:  Number of Hours: 7     Treatment Plan Summary: Daily contact with patient to assess and evaluate symptoms and progress in treatment, Medication management and Plan Continue current medicine.  Plan for likely discharge within 1 to 2 days.  Continue dialysis as scheduled.  Continue patient's diabetes management.  Alethia Berthold, MD 04/09/2018, 3:48 PM

## 2018-04-09 NOTE — Progress Notes (Signed)
D: Pt during assessments is in his room, isolative and withdrawn resting. Pt. denies SI/HI/AVH, contracts for safety. Pt is pleasant and cooperative, but minimal. Pt. has no Complaints. Patient Interactions minimal this evening, but appropriate. Pt. Endorses a normal mood. Pt. Affect is flat.   A: Q x 15 minute observation checks were continued for safety. Patient was provided with education, but needs reinforcement. Patient was given/offered medications per orders. Patient  was encourage to attend groups, participate in unit activities and continue with plan of care. Pt. Chart and plans of care reviewed. Pt. Given support and encouragement.   R: Patient is complaint with medication and unit procedures. Pt. Attends snack time, eating good. Pt. Intake and output monitored. Pt. To be weighed in the morning per MD orders. Pt. Blood sugars monitored per orders.

## 2018-04-09 NOTE — Progress Notes (Signed)
Hypoglycemic Event  CBG:23  Treatment: 15 gram carbohydrate and dinner. Symptoms Patient verbalized feels tired Follow-up CBG: Time:16:40 CBG Result:65 06;99                                                         96 Possible Reasons for Event: Patient did not eat breakfast.Ate 80% lunch.6 units Regular insulin with meals given. Comments/MD notified:Dr.[name]  Rica Records Brelyn Woehl RN

## 2018-04-10 LAB — RENAL FUNCTION PANEL
Albumin: 3.8 g/dL (ref 3.5–5.0)
Anion gap: 13 (ref 5–15)
BUN: 70 mg/dL — ABNORMAL HIGH (ref 6–20)
CO2: 26 mmol/L (ref 22–32)
Calcium: 8.9 mg/dL (ref 8.9–10.3)
Chloride: 97 mmol/L — ABNORMAL LOW (ref 98–111)
Creatinine, Ser: 14.87 mg/dL — ABNORMAL HIGH (ref 0.61–1.24)
GFR calc non Af Amer: 4 mL/min — ABNORMAL LOW (ref >60)
GFR, EST AFRICAN AMERICAN: 4 mL/min — AB (ref >60)
Glucose, Bld: 33 mg/dL — CL (ref 70–99)
Phosphorus: 3.7 mg/dL (ref 2.5–4.6)
Potassium: 6.1 mmol/L — ABNORMAL HIGH (ref 3.5–5.1)
Sodium: 136 mmol/L (ref 135–145)

## 2018-04-10 LAB — GLUCOSE, CAPILLARY
GLUCOSE-CAPILLARY: 181 mg/dL — AB (ref 70–99)
Glucose-Capillary: 395 mg/dL — ABNORMAL HIGH (ref 70–99)
Glucose-Capillary: 54 mg/dL — ABNORMAL LOW (ref 70–99)
Glucose-Capillary: 94 mg/dL (ref 70–99)
Glucose-Capillary: 74 mg/dL (ref 70–99)
Glucose-Capillary: 113 mg/dL — ABNORMAL HIGH (ref 70–99)

## 2018-04-10 LAB — PHOSPHORUS: Phosphorus: 2.3 mg/dL — ABNORMAL LOW (ref 2.5–4.6)

## 2018-04-10 MED ORDER — INSULIN ASPART 100 UNIT/ML ~~LOC~~ SOLN
5.0000 [IU] | Freq: Three times a day (TID) | SUBCUTANEOUS | Status: DC
  Administered 2018-04-10 – 2018-04-11 (×3): 5 [IU] via SUBCUTANEOUS
  Filled 2018-04-10: qty 1

## 2018-04-10 MED ORDER — CINACALCET HCL 30 MG PO TABS: 90.0000 mg | ORAL_TABLET | ORAL | 0 refills | Status: AC

## 2018-04-10 MED ORDER — FLUOXETINE HCL 20 MG PO CAPS: 20.0000 mg | ORAL_CAPSULE | Freq: Every day | ORAL | 1 refills | Status: AC

## 2018-04-10 MED ORDER — RENA-VITE PO TABS: 1.0000 | ORAL_TABLET | Freq: Every day | ORAL | 0 refills | Status: AC

## 2018-04-10 MED ORDER — INSULIN GLARGINE 100 UNIT/ML ~~LOC~~ SOLN: 8.0000 [IU] | Freq: Two times a day (BID) | SUBCUTANEOUS | 11 refills | Status: DC

## 2018-04-10 MED ORDER — RISPERIDONE 1 MG PO TABS: 1.0000 mg | ORAL_TABLET | Freq: Two times a day (BID) | ORAL | 1 refills | Status: AC

## 2018-04-10 MED ORDER — INSULIN ASPART 100 UNIT/ML ~~LOC~~ SOLN: 5.0000 [IU] | Freq: Three times a day (TID) | SUBCUTANEOUS | 11 refills | Status: DC

## 2018-04-10 MED ORDER — NIFEDIPINE ER 30 MG PO TB24: 30.0000 mg | ORAL_TABLET | Freq: Two times a day (BID) | ORAL | 0 refills | Status: AC

## 2018-04-10 MED ORDER — RISPERIDONE 1 MG PO TABS
1.0000 mg | ORAL_TABLET | Freq: Two times a day (BID) | ORAL | Status: DC
  Administered 2018-04-10 – 2018-04-11 (×3): 1 mg via ORAL
  Filled 2018-04-10 (×2): qty 1

## 2018-04-10 MED ORDER — MINOXIDIL 2.5 MG PO TABS: 5.0000 mg | ORAL_TABLET | Freq: Every day | ORAL | 0 refills | Status: AC

## 2018-04-10 MED ORDER — INSULIN GLARGINE 100 UNIT/ML ~~LOC~~ SOLN
8.0000 [IU] | Freq: Two times a day (BID) | SUBCUTANEOUS | Status: DC
  Administered 2018-04-10 – 2018-04-11 (×2): 8 [IU] via SUBCUTANEOUS
  Filled 2018-04-10 (×5): qty 0.08

## 2018-04-10 MED ORDER — LEVETIRACETAM 500 MG PO TABS: 500.0000 mg | ORAL_TABLET | Freq: Every day | ORAL | 0 refills | Status: AC

## 2018-04-10 MED ORDER — LEVETIRACETAM 250 MG PO TABS: 250.0000 mg | ORAL_TABLET | ORAL | 0 refills | Status: AC

## 2018-04-10 MED ORDER — ISOSORB DINITRATE-HYDRALAZINE 20-37.5 MG PO TABS: 1.0000 | ORAL_TABLET | Freq: Three times a day (TID) | ORAL | 0 refills | Status: AC

## 2018-04-10 MED ORDER — CALCIUM ACETATE (PHOS BINDER) 667 MG PO CAPS: 2001.0000 mg | ORAL_CAPSULE | Freq: Three times a day (TID) | ORAL | 0 refills | Status: AC

## 2018-04-10 NOTE — Progress Notes (Signed)
Pre HD assessment    04/10/18 0947  Neurological  Level of Consciousness Alert  Orientation Level Oriented X4  Respiratory  Respiratory Pattern Regular;Unlabored  Chest Assessment Chest expansion symmetrical  Cardiac  Pulse Regular  ECG Monitor Yes  Cardiac Rhythm ST  Vascular  R Radial Pulse +2  L Radial Pulse +2  Edema Generalized;Facial;Periorbital  Integumentary  Integumentary (WDL) X  Skin Color Appropriate for ethnicity  Musculoskeletal  Musculoskeletal (WDL) WDL  GU Assessment  Genitourinary (WDL) X  Genitourinary Symptoms  (HD)  Psychosocial  Psychosocial (WDL) WDL  Patient Behaviors Calm;Cooperative;Appropriate for situation

## 2018-04-10 NOTE — [facility] Group Notes (Signed)
LCSW Group Therapy Note  04/10/2018 1:00 PM  Type of Therapy/Topic:  Group Therapy:  Feelings about Diagnosis  Participation Level:  Did Not Attend   Description of Group:   This group will allow patients to explore their thoughts and feelings about diagnoses they have received. Patients will be guided to explore their level of understanding and acceptance of these diagnoses. Facilitator will encourage patients to process their thoughts and feelings about the reactions of others to their diagnosis and will guide patients in identifying ways to discuss their diagnosis with significant others in their lives. This group will be process-oriented, with patients participating in exploration of their own experiences, giving and receiving support, and processing challenge from other group members.   Therapeutic Goals: 1. Patient will demonstrate understanding of diagnosis as evidenced by identifying two or more symptoms of the disorder 2. Patient will be able to express two feelings regarding the diagnosis 3. Patient will demonstrate their ability to communicate their needs through discussion and/or role play  Summary of Patient Progress: Patient was not in attendance.  Patient was in dialysis.  Therapeutic Modalities:   Cognitive Behavioral Therapy Brief Therapy Feelings Identification   Assunta Curtis, MSW, LCSW 04/10/2018 2:07 PM

## 2018-04-10 NOTE — Progress Notes (Signed)
D: Pt during assessments in his room. Pt. denies SI/HI/AVH, contracts for safety. Pt is pleasant and cooperative, engages appropriate. Pt. has no Complaints, denies pain. Patient interaction appropriate. Pt. Endorses a normal mood.   A: Q x 15 minute observation checks continued for safety per orders. Patient was provided with education, but needs reinforcement. Patient was given/offered medications per orders. Patient  was encourage to attend groups, participate in unit activities and continue with plan of care. Pt. Chart and plans of care reviewed. Pt. Given support and encouragement.   R: Patient is complaint with medications and unit procedures this evening. Pt. Attended snack time and group. Pt. Out visible in the milieu and sociable with staff and peers. Pt. Blood sugars monitored. Pt. Weight and intake output monitored.

## 2018-04-10 NOTE — Progress Notes (Signed)
Pre HD assessment    04/10/18 0946  Vital Signs  Temp 98.6 F (37 C)  Temp Source Oral  Pulse Rate (!) 110  Pulse Rate Source Monitor  Resp 14  BP (!) 160/79  BP Location Right Arm  BP Method Automatic  Patient Position (if appropriate) Sitting  Oxygen Therapy  SpO2 98 %  O2 Device Room Air  Pain Assessment  Pain Scale 0-10  Pain Score 0  Dialysis Weight  Weight 95.4 kg  Type of Weight Pre-Dialysis  Time-Out for Hemodialysis  What Procedure? HD  Pt Identifiers(min of two) First/Last Name;MRN/Account#  Correct Site? Yes  Correct Side? Yes  Correct Procedure? Yes  Consents Verified? Yes  Rad Studies Available? N/A  Safety Precautions Reviewed? Yes  Engineer, civil (consulting) Number  ([id number])  Station Number 1  UF/Alarm Test Passed  Conductivity: Meter 13.8  Conductivity: Machine  14  pH 7.4  Reverse Osmosis main  Normal Saline Lot Number [id number]  Dialyzer Lot Number [id number]  Disposable Set Lot Number [id number]  Machine Temperature 98.6 F (37 C)  Musician and Audible Yes  Blood Lines Intact and Secured Yes  Pre Treatment Patient Checks  Vascular access used during treatment Graft  Hepatitis B Surface Antigen Results Negative  Date Hepatitis B Surface Antigen Drawn 02/22/18  Hepatitis B Surface Antibody 156 (>10)  Date Hepatitis B Surface Antibody Drawn 12/09/17  Hemodialysis Consent Verified Yes  Hemodialysis Standing Orders Initiated Yes  ECG (Telemetry) Monitor On Yes  Prime Ordered Normal Saline  Length of  DialysisTreatment -hour(s) 3.5 Hour(s)  Dialyzer Elisio 17H NR  Dialysate 2K, 2.5 Ca  Dialysis Anticoagulant None  Dialysate Flow Ordered 800  Blood Flow Rate Ordered 400 mL/min  Ultrafiltration Goal 3 Liters  Pre Treatment Labs Renal panel;Phosphorus  Dialysis Blood Pressure Support Ordered Normal Saline  Education / Care Plan  Dialysis Education Provided Yes  Documented Education in Care Plan Yes  Fistula / Graft Left Upper arm Arteriovenous  vein graft  Placement Date/Time: 06/15/15 1318   Placed prior to admission: No  Orientation: Left  Access Location: Upper arm  Access Type: Arteriovenous vein graft  Expiration Date: 12/31/17  Site Condition No complications  Fistula / Graft Assessment Present;Thrill;Bruit  Drainage Description None

## 2018-04-10 NOTE — Plan of Care (Signed)
Patient tolerated dialysis.Pleasant and cooperative on approach.Denies SI,HI and AVH.Visible in the milieu.Appetite and energy level good.Support and encouragement given.

## 2018-04-10 NOTE — Progress Notes (Signed)
Post HD assessment    04/10/18 1343  Neurological  Level of Consciousness Alert  Orientation Level Oriented X4  Respiratory  Respiratory Pattern Regular;Unlabored  Chest Assessment Chest expansion symmetrical  Cardiac  Pulse Regular  ECG Monitor Yes  Cardiac Rhythm ST  Vascular  R Radial Pulse +2  L Radial Pulse +2  Edema Generalized;Facial;Periorbital  Integumentary  Integumentary (WDL) X  Skin Color Appropriate for ethnicity  Musculoskeletal  Musculoskeletal (WDL) WDL  GU Assessment  Genitourinary (WDL) X  Genitourinary Symptoms  (HD)  Psychosocial  Psychosocial (WDL) WDL

## 2018-04-10 NOTE — Progress Notes (Signed)
[facility] MD Progress Note  04/10/2018 12:29 PM Edward Colon  MRN:  [id number] Subjective: Follow-up for this gentleman with major depression.  Complications of his diabetes make everything harder to judge with this gentleman.  Today he had a blood sugar of only 33 just at the beginning of dialysis treatment.  He was eating during treatment and did not have any specific complaints but he still has periods of the day of being fatigued and withdrawn which may correlate more with his blood sugars and his metabolism than anything else.  Patient was awake when I saw him today and denies feeling suicidal denies psychotic symptoms says his mood is feeling better. Principal Problem: MDD (major depressive disorder), recurrent severe, without psychosis ([place]) Diagnosis: Principal Problem:   MDD (major depressive disorder), recurrent severe, without psychosis ([place]) Active Problems:   Seizures ([place])   Hypertension   Type 1 diabetes ([place])   Acute on chronic renal failure ([place])  Total Time spent with patient: 30 minutes  Past Psychiatric History: Patient has a history of recurrent depression more recently with psychotic symptoms all complicated by and complicating his medical issues  Past Medical History:  Past Medical History:  Diagnosis Date  . Anemia   . Blind left eye since ~ 2010  . Depression   . Diabetic peripheral neuropathy ([place])   . ESRD (end stage renal disease) on dialysis [facility])    "TTS; [place] [facility]; [facility]" (02/01/2016)  . Heart murmur    denies any problems with it  . Hypertension   . Seizures ([place])    "last one was end of 2016; they are related to my diabetes" (02/01/2016)  . Type 1 diabetes ([place]) dx'd 1990    Past Surgical History:  Procedure Laterality Date  . AV FISTULA PLACEMENT Right 03/03/2015   Procedure: RIGHT RADIO-CEPHALIC ARTERIOVENOUS (AV) FISTULA CREATION;  Surgeon: Serafina Mitchell, MD;  Location: [facility] OR;  Service: Vascular;  Laterality: Right;  . AV FISTULA  PLACEMENT Left 06/15/2015   Procedure: INSERTION OF LEFT UPPER ARM  ARTERIOVENOUS (AV) 44mm x 50cm GORE-TEX GRAFT;  Surgeon: Elam Dutch, MD;  Location: [place];  Service: Vascular;  Laterality: Left;  . BASCILIC VEIN TRANSPOSITION Left 04/01/2015   Procedure: BASCILIC VEIN TRANSPOSITION-LEFT ARM- FIRST STAGE;  Surgeon: Elam Dutch, MD;  Location: [place];  Service: Vascular;  Laterality: Left;  . EYE SURGERY Right ~ 2010   for diabetic retinopathy  . INSERTION OF DIALYSIS CATHETER Right 03/03/2015   Procedure: INSERTION OF DIALYSIS CATHETER;  Surgeon: Serafina Mitchell, MD;  Location[facility] OR;  Service: Vascular;  Laterality: Right;   Family History:  Family History  Problem Relation Age of Onset  . Diabetes Neg Hx    Family Psychiatric  History: None reported Social History:  Social History   Substance and Sexual Activity  Alcohol Use No  . Frequency: Never   Comment: 02/01/2016 "might have 1 drink/year"     Social History   Substance and Sexual Activity  Drug Use No    Social History   Socioeconomic History  . Marital status: Single    Spouse name: Not on file  . Number of children: Not on file  . Years of education: Not on file  . Highest education level: Not on file  Occupational History  . Not on file  Social Needs  . Financial resource strain: Not on file  . Food insecurity:    Worry: Not on file    Inability: Not on  file  . Transportation needs:    Medical: Not on file    Non-medical: Not on file  Tobacco Use  . Smoking status: Never Smoker  . Smokeless tobacco: Never Used  Substance and Sexual Activity  . Alcohol use: No    Frequency: Never    Comment: 02/01/2016 "might have 1 drink/year"  . Drug use: No  . Sexual activity: Yes  Lifestyle  . Physical activity:    Days per week: Not on file    Minutes per session: Not on file  . Stress: Not on file  Relationships  . Social connections:    Talks on phone: Not on file    Gets together: Not on file     Attends religious service: Not on file    Active member of club or organization: Not on file    Attends meetings of clubs or organizations: Not on file    Relationship status: Not on file  Other Topics Concern  . Not on file  Social History Narrative  . Not on file   Additional Social History:                         Sleep: Fair  Appetite:  Fair  Current Medications: Current Facility-Administered Medications  Medication Dose Route Frequency Provider Last Rate Last Dose  . 0.9 %  sodium chloride infusion  100 mL Intravenous PRN Alric Seton, PA-C      . 0.9 %  sodium chloride infusion  100 mL Intravenous PRN Alric Seton, PA-C      . acetaminophen (TYLENOL) tablet 650 mg  650 mg Oral Q4H PRN [name], MD      . alteplase (CATHFLO ACTIVASE) injection 2 mg  2 mg Intracatheter Once PRN Alric Seton, PA-C      . alum & mag hydroxide-simeth (MAALOX/MYLANTA) 200-200-20 MG/5ML suspension 30 mL  30 mL Oral Q4H PRN [name], MD      . calcium acetate (PHOSLO) capsule 2,001 mg  2,001 mg Oral TID WC [name], MD   2,001 mg at 04/10/18 0751  . Chlorhexidine Gluconate Cloth 2 % PADS 6 each  6 each Topical Q0600 [name], MD   6 each at 04/10/18 [phone]  . cinacalcet (SENSIPAR) tablet 90 mg  90 mg Oral Q T,Th,Sa-HD [name], MD   90 mg at 04/07/18 1444  . FLUoxetine (PROZAC) capsule 20 mg  20 mg Oral Daily [name], MD   20 mg at 04/10/18 0746  . heparin injection 1,000 Units  1,000 Units Dialysis PRN Alric Seton, PA-C      . heparin injection 1,700 Units  20 Units/kg Dialysis PRN Alric Seton, PA-C      . heparin injection 4,300 Units  50 Units/kg Dialysis PRN Kolluru, Sarath, MD      . insulin aspart (novoLOG) injection 0-5 Units  0-5 Units Subcutaneous TID WC , [name], MD   5 Units at 04/10/18 0740  . insulin aspart (novoLOG) injection 5 Units  5 Units Subcutaneous TID WC ,  T, MD      . insulin glargine  (LANTUS) injection 8 Units  8 Units Subcutaneous BID ,  T, MD      . isosorbide-hydrALAZINE (BIDIL) 20-37.5 MG per tablet 1 tablet  1 tablet Oral TID [name], MD   1 tablet at 04/10/18 [phone]  . levETIRAcetam (KEPPRA) tablet 250 mg  250 mg Oral Q T,Th,Sat-1800  [name], MD   250 mg at 04/07/18 1732  . levETIRAcetam (KEPPRA) tablet 500 mg  500 mg Oral Q0600 [name], MD   500 mg at 04/10/18 5956  . lidocaine (PF) (XYLOCAINE) 1 % injection 5 mL  5 mL Intradermal PRN Alric Seton, PA-C      . lidocaine-prilocaine (EMLA) cream 1 application  1 application Topical PRN Alric Seton, PA-C      . minoxidil (LONITEN) tablet 5 mg  5 mg Oral q1800 , [name], MD   5 mg at 04/09/18 1627  . multivitamin (RENA-VIT) tablet 1 tablet  1 tablet Oral QHS [name], MD   1 tablet at 04/09/18 2115  . NIFEdipine (PROCARDIA-XL/NIFEDICAL-XL) 24 hr tablet 30 mg  30 mg Oral BID [name], MD   30 mg at 04/10/18 3875  . pentafluoroprop-tetrafluoroeth (GEBAUERS) aerosol 1 application  1 application Topical PRN Alric Seton, PA-C      . risperiDONE (RISPERDAL M-TABS) disintegrating tablet 1 mg  1 mg Oral BID , [name], MD   1 mg at 04/10/18 0751    Lab Results:  Results for orders placed or performed during the hospital encounter of 03/28/18 (from the past 48 hour(s))  Glucose, capillary     Status: Abnormal   Collection Time: 04/08/18  4:31 PM  Result Value Ref Range   Glucose-Capillary 237 (H) 70 - 99 mg/dL  Glucose, capillary     Status: Abnormal   Collection Time: 04/09/18  6:59 AM  Result Value Ref Range   Glucose-Capillary 221 (H) 70 - 99 mg/dL  Glucose, capillary     Status: Abnormal   Collection Time: 04/09/18 11:26 AM  Result Value Ref Range   Glucose-Capillary 140 (H) 70 - 99 mg/dL   Comment 1 Notify RN   Glucose, capillary     Status: Abnormal   Collection Time: 04/09/18  4:13 PM  Result Value Ref Range   Glucose-Capillary 23 (LL) 70 -  99 mg/dL   Comment 1 Notify RN   Glucose, capillary     Status: Abnormal   Collection Time: 04/09/18  4:40 PM  Result Value Ref Range   Glucose-Capillary 64 (L) 70 - 99 mg/dL   Comment 1 Notify RN   Glucose, capillary     Status: None   Collection Time: 04/09/18  5:07 PM  Result Value Ref Range   Glucose-Capillary 98 70 - 99 mg/dL  Glucose, capillary     Status: Abnormal   Collection Time: 04/09/18  6:24 PM  Result Value Ref Range   Glucose-Capillary 163 (H) 70 - 99 mg/dL  Glucose, capillary     Status: Abnormal   Collection Time: 04/09/18  8:14 PM  Result Value Ref Range   Glucose-Capillary 262 (H) 70 - 99 mg/dL   Comment 1 Notify RN   Glucose, capillary     Status: Abnormal   Collection Time: 04/10/18  6:56 AM  Result Value Ref Range   Glucose-Capillary 395 (H) 70 - 99 mg/dL   Comment 1 Notify RN   Renal function panel     Status: Abnormal   Collection Time: 04/10/18 11:00 AM  Result Value Ref Range   Sodium 136 135 - 145 mmol/L   Potassium 6.1 (H) 3.5 - 5.1 mmol/L   Chloride 97 (L) 98 - 111 mmol/L   CO2 26 22 - 32 mmol/L   Glucose, Bld 33 (LL) 70 - 99 mg/dL    Comment: CRITICAL RESULT CALLED TO,  READ BACK BY AND VERIFIED WITH  @1150  ON 04/10/18 BY HKP    BUN 70 (H) 6 - 20 mg/dL    Comment: RESULT CONFIRMED BY MANUAL DILUTION/HKP   Creatinine, Ser 14.87 (H) 0.61 - 1.24 mg/dL   Calcium 8.9 8.9 - 10.3 mg/dL   Phosphorus 3.7 2.5 - 4.6 mg/dL   Albumin 3.8 3.5 - 5.0 g/dL   GFR calc non Af Amer 4 (L) >60 mL/min   GFR calc Af Amer 4 (L) >60 mL/min   Anion gap 13 5 - 15    Comment: Performed at [facility], [address] [place], [place] [address]  Phosphorus     Status: Abnormal   Collection Time: 04/10/18 11:00 AM  Result Value Ref Range   Phosphorus 2.3 (L) 2.5 - 4.6 mg/dL    Comment: Performed at [facility], [address] [place], [place] [address]  Glucose, capillary     Status: Abnormal   Collection Time: 04/10/18 12:02 PM   Result Value Ref Range   Glucose-Capillary 54 (L) 70 - 99 mg/dL   Comment 1 Notify RN     Blood Alcohol level:  Lab Results  Component Value Date   ETH <10 03/12/2018   ETH <10 18/29/9371    Metabolic Disorder Labs: Lab Results  Component Value Date   HGBA1C 9.1 (H) 02/13/2018   MPG 214.47 02/13/2018   MPG 249 08/31/2014   No results found for: PROLACTIN Lab Results  Component Value Date   TRIG 221 (H) 02/10/2018    Physical Findings: AIMS:  , ,  ,  ,    CIWA:    COWS:     Musculoskeletal: Strength & Muscle Tone: within normal limits Gait & Station: normal Patient leans: N/A  Psychiatric Specialty Exam: Physical Exam  Nursing note and vitals reviewed. Constitutional: He appears well-developed and well-nourished.  HENT:  Head: Normocephalic and atraumatic.  Eyes: Pupils are equal, round, and reactive to light. Conjunctivae are normal.  Neck: Normal range of motion.  Cardiovascular: Regular rhythm and normal heart sounds.  Respiratory: Effort normal. No respiratory distress.  GI: Soft.  Musculoskeletal: Normal range of motion.  Neurological: He is alert.  Skin: Skin is warm and dry.  Psychiatric: He has a normal mood and affect. His behavior is normal. Judgment and thought content normal. His speech is delayed. Cognition and memory are normal.    Review of Systems  Constitutional: Negative.   HENT: Negative.   Eyes: Negative.   Respiratory: Negative.   Cardiovascular: Negative.   Gastrointestinal: Negative.   Musculoskeletal: Negative.   Skin: Negative.   Neurological: Negative.   Psychiatric/Behavioral: Negative.     Blood pressure (!) 164/98, pulse (!) 113, temperature 98.6 F (37 C), temperature source Oral, resp. rate 17, height 6' (1.829 m), weight 95.4 kg, SpO2 100 %.Body mass index is 28.52 kg/m.  General Appearance: Fairly Groomed  Eye Contact:  Fair  Speech:  Slow  Volume:  Decreased  Mood:  Euthymic  Affect:  Constricted  Thought  Process:  Goal Directed  Orientation:  Full (Time, Place, and Person)  Thought Content:  Logical  Suicidal Thoughts:  No  Homicidal Thoughts:  No  Memory:  Immediate;   Fair Recent;   Fair Remote;   Fair  Judgement:  Fair  Insight:  Fair  Psychomotor Activity:  Decreased  Concentration:  Concentration: Fair  Recall:  [employer] of Knowledge:  Fair  Language:  Fair  Akathisia:  No  Handed:  Right  AIMS (if indicated):     Assets:  Desire for Improvement Housing Social Support  ADL's:  Intact  Cognition:  WNL  Sleep:  Number of Hours: 7.15     Treatment Plan Summary: Daily contact with patient to assess and evaluate symptoms and progress in treatment, Medication management and Plan Diabetes coordinator also picked up on his sugars and recommended some cutbacks in his insulin which I have done.  Patient's blood sugar is being monitored now.  They got another one checked while he is in dialysis and he is eating over there.  Patient mentally seems to be getting better and more stable.  Patient not actively suicidal no obvious or overt psychosis.  Tolerating medicine.  Case reviewed with treatment team today and we are likely to recommend discharge by the day after tomorrow or possibly even tomorrow.  Alethia Berthold, MD 04/10/2018, 12:29 PM

## 2018-04-10 NOTE — Progress Notes (Deleted)
Pre HD assessment    04/10/18 0946  Vital Signs  Temp 98.6 F (37 C)  Temp Source Oral  Pulse Rate (!) 110  Pulse Rate Source Monitor  Resp 14  BP (!) 160/79  BP Location Right Arm  BP Method Automatic  Patient Position (if appropriate) Sitting  Oxygen Therapy  SpO2 98 %  O2 Device Room Air  Pain Assessment  Pain Scale 0-10  Pain Score 0  Dialysis Weight  Weight 95.4 kg  Type of Weight Pre-Dialysis  Time-Out for Hemodialysis  What Procedure? HD  Pt Identifiers(min of two) First/Last Name;MRN/Account#  Correct Site? Yes  Correct Side? Yes  Correct Procedure? Yes  Consents Verified? Yes  Rad Studies Available? N/A  Safety Precautions Reviewed? Yes  Engineer, civil (consulting) Number  (7A)  Station Number 1  UF/Alarm Test Passed  Conductivity: Meter 13.8  Conductivity: Machine  14  pH 7.4  Reverse Osmosis main  Normal Saline Lot Number [id number]  Dialyzer Lot Number [id number]  Disposable Set Lot Number [id number]  Machine Temperature 98.6 F (37 C)  Musician and Audible Yes  Blood Lines Intact and Secured Yes  Pre Treatment Patient Checks  Vascular access used during treatment Catheter  Hepatitis B Surface Antigen Results Negative  Date Hepatitis B Surface Antigen Drawn 02/22/18  Hepatitis B Surface Antibody 156 (>10)  Date Hepatitis B Surface Antibody Drawn 12/09/17  Hemodialysis Consent Verified Yes  Hemodialysis Standing Orders Initiated Yes  ECG (Telemetry) Monitor On Yes  Prime Ordered Normal Saline  Length of  DialysisTreatment -hour(s) 3.5 Hour(s)  Dialyzer Elisio 17H NR  Dialysate 2K, 2.5 Ca  Dialysis Anticoagulant None  Dialysate Flow Ordered 800  Blood Flow Rate Ordered 400 mL/min  Ultrafiltration Goal 3 Liters  Pre Treatment Labs Renal panel;Phosphorus  Dialysis Blood Pressure Support Ordered Normal Saline  Education / Care Plan  Dialysis Education Provided Yes  Documented Education in Care Plan Yes  Fistula / Graft Left Upper arm  Arteriovenous vein graft  Placement Date/Time: 06/15/15 1318   Placed prior to admission: No  Orientation: Left  Access Location: Upper arm  Access Type: Arteriovenous vein graft  Expiration Date: 12/31/17  Site Condition No complications  Fistula / Graft Assessment Present;Thrill;Bruit  Drainage Description None

## 2018-04-10 NOTE — Progress Notes (Signed)
HD tx start    04/10/18 1001  Vital Signs  Pulse Rate (!) 109  Pulse Rate Source Monitor  Resp 12  BP (!) 158/76  BP Location Right Arm  BP Method Automatic  Patient Position (if appropriate) Sitting  Oxygen Therapy  SpO2 99 %  O2 Device Room Air  During Hemodialysis Assessment  Blood Flow Rate (mL/min) 400 mL/min  Arterial Pressure (mmHg) -160 mmHg  Venous Pressure (mmHg) 210 mmHg  Transmembrane Pressure (mmHg) 80 mmHg  Ultrafiltration Rate (mL/min) 1000 mL/min  Dialysate Flow Rate (mL/min) 800 ml/min  Conductivity: Machine  14.1  HD Safety Checks Performed Yes  Dialysis Fluid Bolus Normal Saline  Bolus Amount (mL) 250 mL  Intra-Hemodialysis Comments Tx initiated  Fistula / Graft Left Upper arm Arteriovenous vein graft  Placement Date/Time: 06/15/15 1318   Placed prior to admission: No  Orientation: Left  Access Location: Upper arm  Access Type: Arteriovenous vein graft  Expiration Date: 12/31/17  Status Accessed  Needle Size 15

## 2018-04-10 NOTE — Progress Notes (Signed)
Post HD assessment. Pt tolerated tx well without c/o or complication. Net UF 4018, goal met.    04/10/18 1345  Vital Signs  Temp 98.5 F (36.9 C)  Temp Source Oral  Pulse Rate (!) 110  Pulse Rate Source Monitor  Resp 15  BP (!) 162/83  BP Location Right Arm  BP Method Automatic  Patient Position (if appropriate) Lying  Oxygen Therapy  SpO2 100 %  O2 Device Room Air  Dialysis Weight  Weight 90.8 kg  Type of Weight Post-Dialysis  Post-Hemodialysis Assessment  Rinseback Volume (mL) 250 mL  KECN 78.8 V  Dialyzer Clearance Lightly streaked  Duration of HD Treatment -hour(s) 3.5 hour(s)  Hemodialysis Intake (mL) 500 mL  UF Total -Machine (mL) 4518 mL  Net UF (mL) 4018 mL  Tolerated HD Treatment Yes  AVG/AVF Arterial Site Held (minutes) 10 minutes  AVG/AVF Venous Site Held (minutes) 10 minutes  Education / Care Plan  Dialysis Education Provided Yes  Documented Education in Care Plan Yes  Fistula / Graft Left Upper arm  Placement Date/Time: 02/08/18 (c) 2118   Placed prior to admission: Yes  Orientation: Left  Access Location: Upper arm  Site Condition No complications  Fistula / Graft Assessment Present;Thrill;Bruit  Status Deaccessed  Drainage Description None

## 2018-04-10 NOTE — BHH Counselor (Signed)
CSW attempted several times throughout the day to speak with the patient regarding future discharge and any changed to his earlier recommendation to not be referred to outpatient/medication management.    Pt was asleep at first attempt, subsequent attempts the pt was in dialysis.  Last attempt the patient was asleep again.  Assunta Curtis, MSW, LCSW 04/10/2018 3:49 PM

## 2018-04-10 NOTE — Progress Notes (Signed)
Recreation Therapy Notes  Date: 04/10/2018  Time: 9:30 am   Location: Craft room   Behavioral response: N/A   Intervention Topic: Goals  Discussion/Intervention: Patient did not attend group.   Clinical Observations/Feedback:  Patient did not attend group.   Weiland Tomich LRT/CTRS        Sheneka Schrom 04/10/2018 10:54 AM

## 2018-04-10 NOTE — Progress Notes (Signed)
HD tx end    04/10/18 1339  Vital Signs  Pulse Rate (!) 109  Pulse Rate Source Monitor  Resp 14  BP (!) 165/80  BP Location Right Arm  BP Method Automatic  Patient Position (if appropriate) Lying  Oxygen Therapy  SpO2 98 %  O2 Device Room Air  During Hemodialysis Assessment  Dialysis Fluid Bolus Normal Saline  Bolus Amount (mL) 250 mL  Intra-Hemodialysis Comments Tx completed

## 2018-04-10 NOTE — Progress Notes (Signed)
Inpatient Diabetes Program Recommendations  AACE/ADA: New Consensus Statement on Inpatient Glycemic Control (2015)  Target Ranges:  Prepandial:   less than 140 mg/dL      Peak postprandial:   less than 180 mg/dL (1-2 hours)      Critically ill patients:  140 - 180 mg/dL   Lab Results  Component Value Date   GLUCAP 395 (H) 04/10/2018   HGBA1C 9.1 (H) 02/13/2018    Review of Glycemic ControlResults for RENEE, BEALE (MRN [id number]) as of 04/10/2018 10:55  Ref. Range 04/09/2018 06:59 04/09/2018 11:26 04/09/2018 16:13 04/09/2018 16:40 04/09/2018 17:07 04/09/2018 18:24 04/09/2018 20:14 04/10/2018 06:56  Glucose-Capillary Latest Ref Range: 70 - 99 mg/dL 221 (H) 140 (H) 23 (LL) 64 (L) 98 163 (H) 262 (H) 395 (H)   Diabetes history: DM 1 Outpatient Diabetes medications:Per patient report:Lantus 25 units QHS, Novolog "approximately 3 units with meals" Current orders for Inpatient glycemic control:  Novolog 0-5 units tid with meals, Novolog 6 units tid with meals, Lantus 10 units bid Inpatient Diabetes Program Recommendations:   Note low blood sugar on on 04/09/18 after receiving Novolog 6 units with lunch.  Patient's blood sugars have been very labile.    Patient only received 10 units of Lantus on 2/3 due to patient's refusal of Lantus on 2/3.   -Consider reducing Novolog meal coverage to 5 units tid with meals. -Also consider reducing Lantus to 8 units bid  Thanks,  Adah Perl, RN, BC-ADM Inpatient Diabetes Coordinator Pager [phone] (8a-5p)

## 2018-04-10 NOTE — BHH Suicide Risk Assessment (Signed)
[facility] Discharge Suicide Risk Assessment   Principal Problem: MDD (major depressive disorder), recurrent severe, without psychosis ([place]) Discharge Diagnoses: Principal Problem:   MDD (major depressive disorder), recurrent severe, without psychosis ([place]) Active Problems:   Seizures ([place])   Hypertension   Type 1 diabetes ([place])   Acute on chronic renal failure ([place])   Total Time spent with patient: 45 minutes  Musculoskeletal: Strength & Muscle Tone: within normal limits Gait & Station: normal Patient leans: N/A  Psychiatric Specialty Exam: Review of Systems  Constitutional: Negative.   HENT: Negative.   Eyes: Negative.   Respiratory: Negative.   Cardiovascular: Negative.   Gastrointestinal: Negative.   Musculoskeletal: Negative.   Skin: Negative.   Neurological: Negative.   Psychiatric/Behavioral: Positive for memory loss. Negative for depression, hallucinations, substance abuse and suicidal ideas. The patient is not nervous/anxious and does not have insomnia.     Blood pressure (!) 180/81, pulse (!) 113, temperature 98.6 F (37 C), temperature source Oral, resp. rate (!) 22, height 6' (1.829 m), weight 95.4 kg, SpO2 100 %.Body mass index is 28.52 kg/m.  General Appearance: Casual  Eye Contact::  Fair  Speech:  Clear and [id number]  Volume:  Decreased  Mood:  Euthymic  Affect:  Constricted  Thought Process:  Goal Directed  Orientation:  Full (Time, Place, and Person)  Thought Content:  Logical  Suicidal Thoughts:  No  Homicidal Thoughts:  No  Memory:  Immediate;   Fair Recent;   Fair Remote;   Fair  Judgement:  Fair  Insight:  Fair  Psychomotor Activity:  Decreased  Concentration:  Fair  Recall:  [employer] of Knowledge:Fair  Language: Fair  Akathisia:  No  Handed:  Right  AIMS (if indicated):     Assets:  Desire for Improvement Resilience  Sleep:  Number of Hours: 7.15  Cognition: WNL  ADL's:  Intact   Mental Status Per Nursing Assessment::   On  Admission:  Suicidal ideation indicated by others  Demographic Factors:  Male and Low socioeconomic status  Loss Factors: Decline in physical health and Financial problems/change in socioeconomic status  Historical Factors: Prior suicide attempts  Risk Reduction Factors:   Living with another person, especially a relative, Positive social support and Positive therapeutic relationship  Continued Clinical Symptoms:  Depression:   Anhedonia  Cognitive Features That Contribute To Risk:  Loss of executive function    Suicide Risk:  Minimal: No identifiable suicidal ideation.  Patients presenting with no risk factors but with morbid ruminations; may be classified as minimal risk based on the severity of the depressive symptoms    Plan Of Care/Follow-up recommendations:  Activity:  Activity as tolerated Diet:  Strict diabetic control Other:  Patient will need both medical and psychiatric follow-up.  Reviewed the overall treatment plan with him.  Return to hospital or notify someone immediately if suicidal ideation recurs.  Alethia Berthold, MD 04/10/2018, 12:43 PM

## 2018-04-10 NOTE — Plan of Care (Signed)
Pt. Endorses a normal mood. Pt. Denies si/hi/avh, can contract for safety. Pt. Monitored per orders for safety.    Problem: Safety: Goal: Periods of time without injury will increase Outcome: Progressing   Problem: Self-Concept: Goal: Ability to disclose and discuss suicidal ideas will improve Outcome: Progressing Goal: Will verbalize positive feelings about self Outcome: Progressing   Problem: Safety: Goal: Ability to disclose and discuss suicidal ideas will improve Outcome: Progressing

## 2018-04-11 LAB — GLUCOSE, CAPILLARY
Glucose-Capillary: 73 mg/dL (ref 70–99)
Glucose-Capillary: 357 mg/dL — ABNORMAL HIGH (ref 70–99)
Glucose-Capillary: 274 mg/dL — ABNORMAL HIGH (ref 70–99)

## 2018-04-11 NOTE — Progress Notes (Signed)
Precautionary checks every 15 minutes for safety maintained, room free of safety hazards, patient sustains no injury or falls during this shift. Will endorse care to next shift.   

## 2018-04-11 NOTE — BHH Group Notes (Signed)
[facility] Group Notes:  (Nursing/MHT/Case Management/Adjunct)  Date:  04/11/2018  Time:  2:59 PM  Type of Therapy:  Psychoeducational Skills  Participation Level:  Did Not Attend    Edward Colon 04/11/2018, 2:59 PM

## 2018-04-11 NOTE — Progress Notes (Addendum)
  [facility] Adult Case Management Discharge Plan :  Will you be returning to the same living situation after discharge:  Yes,  pt is returning home At discharge, do you have transportation home?: Yes,  pt reports a friend is picking him up Do you have the ability to pay for your medications: Yes,  Medicare  Release of information consent forms completed and in the chart;  Patient's signature needed at discharge.  Patient to Follow up at: Follow-up Information    [facility] up.   Specialty:  [facility] information: [place] [id number] [phone]  Please attend your appointment on 04/18/2018 at [place]. Please bring photo ID and insurance card.           Next level of care provider has access to Industry and Suicide Prevention discussed: Yes,  SPE completed with pt he declined family contact.  Have you used any form of tobacco in the last 30 days? (Cigarettes, Smokeless Tobacco, Cigars, and/or Pipes): No  Has patient been referred to the Quitline?: N/A patient is not a smoker  Patient has been referred for addiction treatment: [place], LCSW 04/11/2018, 10:25 AM

## 2018-04-11 NOTE — Progress Notes (Signed)
Recreation Therapy Notes  Date: 04/11/2018  Time: 9:30 am   Location: Craft room   Behavioral response: N/A   Intervention Topic: Self- care  Discussion/Intervention: Patient did not attend group.   Clinical Observations/Feedback:  Patient did not attend group.   Joannah Gitlin LRT/CTRS        Baneen Wieseler 04/11/2018 11:44 AM

## 2018-04-11 NOTE — Discharge Summary (Signed)
Physician Discharge Summary Note  Patient:  Edward Colon is an 38 y.o., male MRN:  [id number] DOB:  12/02/1980 Patient phone:  [phone] (home)  Patient address:   [place] [address],  Total Time spent with patient: 45 minutes  Date of Admission:  03/28/2018 Date of Discharge: April 11, 2018  Reason for Admission: Patient was admitted in transfer from [facility] because of the need for dialysis.  Patient with severe depression near catatonia at times recent suicidal ideation.  He had been admitted to [facility] for very low blood sugars.  Principal Problem: MDD (major depressive disorder), recurrent severe, without psychosis ([place]) Discharge Diagnoses: Principal Problem:   MDD (major depressive disorder), recurrent severe, without psychosis ([place]) Active Problems:   Seizures ([place])   Hypertension   Type 1 diabetes ([place])   Acute on chronic renal failure (HCC)   Past Psychiatric History: Patient has a past history of recurrent depression complicated by his severe medical problems  Past Medical History:  Past Medical History:  Diagnosis Date  . Anemia   . Blind left eye since ~ 2010  . Depression   . Diabetic peripheral neuropathy ([place])   . ESRD (end stage renal disease) on dialysis [facility])    "TTS; [place]; [facility]" (02/01/2016)  . Heart murmur    denies any problems with it  . Hypertension   . Seizures ([place])    "last one was end of 2016; they are related to my diabetes" (02/01/2016)  . Type 1 diabetes ([place]) dx'd 1990    Past Surgical History:  Procedure Laterality Date  . AV FISTULA PLACEMENT Right 03/03/2015   Procedure: RIGHT RADIO-CEPHALIC ARTERIOVENOUS (AV) FISTULA CREATION;  Surgeon: Serafina Mitchell, MD;  Location: [facility] OR;  Service: Vascular;  Laterality: Right;  . AV FISTULA PLACEMENT Left 06/15/2015   Procedure: INSERTION OF LEFT UPPER ARM  ARTERIOVENOUS (AV) 46mm x 50cm GORE-TEX GRAFT;  Surgeon: Elam Dutch, MD;   Location: [place];  Service: Vascular;  Laterality: Left;  . BASCILIC VEIN TRANSPOSITION Left 04/01/2015   Procedure: BASCILIC VEIN TRANSPOSITION-LEFT ARM- FIRST STAGE;  Surgeon: Elam Dutch, MD;  Location: [place];  Service: Vascular;  Laterality: Left;  . EYE SURGERY Right ~ 2010   for diabetic retinopathy  . INSERTION OF DIALYSIS CATHETER Right 03/03/2015   Procedure: INSERTION OF DIALYSIS CATHETER;  Surgeon: Serafina Mitchell, MD;  Location: [facility] OR;  Service: Vascular;  Laterality: Right;   Family History:  Family History  Problem Relation Age of Onset  . Diabetes Neg Hx    Family Psychiatric  History: None Social History:  Social History   Substance and Sexual Activity  Alcohol Use No  . Frequency: Never   Comment: 02/01/2016 "might have 1 drink/year"     Social History   Substance and Sexual Activity  Drug Use No    Social History   Socioeconomic History  . Marital status: Single    Spouse name: Not on file  . Number of children: Not on file  . Years of education: Not on file  . Highest education level: Not on file  Occupational History  . Not on file  Social Needs  . Financial resource strain: Not on file  . Food insecurity:    Worry: Not on file    Inability: Not on file  . Transportation needs:    Medical: Not on file    Non-medical: Not on file  Tobacco Use  . Smoking status:  Never Smoker  . Smokeless tobacco: Never Used  Substance and Sexual Activity  . Alcohol use: No    Frequency: Never    Comment: 02/01/2016 "might have 1 drink/year"  . Drug use: No  . Sexual activity: Yes  Lifestyle  . Physical activity:    Days per week: Not on file    Minutes per session: Not on file  . Stress: Not on file  Relationships  . Social connections:    Talks on phone: Not on file    Gets together: Not on file    Attends religious service: Not on file    Active member of club or organization: Not on file    Attends meetings of clubs or organizations: Not on file     Relationship status: Not on file  Other Topics Concern  . Not on file  Social History Narrative  . Not on file    Hospital Course: Patient was admitted to our service.  Medical problems were delicate.  Medicine consult was obtained.  Nephrology consult obtained.  Patient continued to get his hemodialysis regularly with close follow-up from nephrology.  Blood sugars remain difficult to control.  Very grateful for assistance from diabetes coordinator.  Required several changes to his insulin doses.  Continued to have many episodes of low blood sugars despite gradually cutting down on his insulin.  Mood initially was very withdrawn and appeared to be showing some signs of psychosis.  Medicines were adjusted with antidepressants augmented by low-dose antipsychotics.  ECT was considered but the family was uncomfortable with this plan.  Soon afterwards patient began to show significant improvement in symptoms.  For the last couple days his mood has been much better.  He is awake alert and oriented.  Denies suicidal ideation.  Denies psychotic symptoms.  Patient expresses insight and understanding of his illness and agrees to continue with outpatient treatment with antidepressant and antipsychotic medicine and follow-up for treatment of depression.  Patient has been educated about the potentially life-threatening nature of his illness.  Physical Findings: AIMS:  , ,  ,  ,    CIWA:    COWS:     Musculoskeletal: Strength & Muscle Tone: within normal limits Gait & Station: normal Patient leans: N/A  Psychiatric Specialty Exam: Physical Exam  Nursing note and vitals reviewed. Constitutional: He appears well-developed and well-nourished.  HENT:  Head: Normocephalic and atraumatic.  Eyes: Pupils are equal, round, and reactive to light. Conjunctivae are normal.  Neck: Normal range of motion.  Cardiovascular: Regular rhythm and normal heart sounds.  Respiratory: Effort normal. No respiratory  distress.  GI: Soft.  Musculoskeletal: Normal range of motion.  Neurological: He is alert.  Skin: Skin is warm and dry.  Psychiatric: Judgment normal. His affect is blunt. His speech is delayed. He is slowed. Thought content is not paranoid. Cognition and memory are normal. He expresses no homicidal and no suicidal ideation.    Review of Systems  Constitutional: Positive for malaise/fatigue.  HENT: Negative.   Eyes: Negative.   Respiratory: Negative.   Cardiovascular: Negative.   Gastrointestinal: Negative.   Musculoskeletal: Negative.   Skin: Negative.   Neurological: Negative.   Psychiatric/Behavioral: Negative.     Blood pressure (!) 156/74, pulse (!) 111, temperature 98.4 F (36.9 C), temperature source Oral, resp. rate 17, height 6' (1.829 m), weight 91.2 kg, SpO2 98 %.Body mass index is 27.26 kg/m.  General Appearance: Casual  Eye Contact:  Good  Speech:  Slow  Volume:  Decreased  Mood:  Euthymic  Affect:  Constricted  Thought Process:  Goal Directed  Orientation:  Full (Time, Place, and Person)  Thought Content:  Logical  Suicidal Thoughts:  No  Homicidal Thoughts:  No  Memory:  Immediate;   Fair Recent;   Fair Remote;   Fair  Judgement:  Fair  Insight:  Fair  Psychomotor Activity:  Decreased  Concentration:  Concentration: Fair  Recall:  [place] of Knowledge:  Fair  Language:  Fair  Akathisia:  No  Handed:  Right  AIMS (if indicated):     Assets:  Desire for Improvement Housing Social Support  ADL's:  Intact  Cognition:  WNL  Sleep:  Number of Hours: 7.3     Have you used any form of tobacco in the last 30 days? (Cigarettes, Smokeless Tobacco, Cigars, and/or Pipes): No  Has this patient used any form of tobacco in the last 30 days? (Cigarettes, Smokeless Tobacco, Cigars, and/or Pipes) Yes, No  Blood Alcohol level:  Lab Results  Component Value Date   ETH <10 03/12/2018   ETH <10 90/24/0973    Metabolic Disorder Labs:  Lab Results   Component Value Date   HGBA1C 9.1 (H) 02/13/2018   MPG 214.47 02/13/2018   MPG 249 08/31/2014   No results found for: PROLACTIN Lab Results  Component Value Date   TRIG 221 (H) 02/10/2018    See Psychiatric Specialty Exam and Suicide Risk Assessment completed by Attending Physician prior to discharge.  Discharge destination:  Home  Is patient on multiple antipsychotic therapies at discharge:  No   Has Patient had three or more failed trials of antipsychotic monotherapy by history:  No  Recommended Plan for Multiple Antipsychotic Therapies: NA  Discharge Instructions    Diet - low sodium heart healthy   Complete by:  As directed    Increase activity slowly   Complete by:  As directed      Allergies as of 04/11/2018   No Known Allergies     Medication List    TAKE these medications     Indication  calcium acetate 667 MG capsule Commonly known as:  PHOSLO Take 3 capsules (2,001 mg total) by mouth 3 (three) times daily with meals. What changed:  how much to take  Indication:  Hyperphosphatemia D/T Renal Insufficiency   cinacalcet 30 MG tablet Commonly known as:  SENSIPAR Take 3 tablets (90 mg total) by mouth Every Tuesday,Thursday,and Saturday with dialysis.  Indication:  High Amount of Calcium in the Blood   FLUoxetine 20 MG capsule Commonly known as:  PROZAC Take 1 capsule (20 mg total) by mouth daily.  Indication:  Depression, Major Depressive Disorder   insulin aspart 100 UNIT/ML injection Commonly known as:  novoLOG Inject 0-9 Units into the skin 3 (three) times daily with meals for 30 days. For glucose 121 to 150 use one unit, for 151 to 200 use two units, for 201 to 250 use three units, for 251 to 300 use five units, for 301 to 350 use seven units, for 351 or greater use 9 units. What changed:  Another medication with the same name was added. Make sure you understand how and when to take each.  Indication:  Insulin-Dependent Diabetes   insulin aspart 100  UNIT/ML injection Commonly known as:  novoLOG Inject 5 Units into the skin 3 (three) times daily with meals. What changed:  You were already taking a medication with the same name, and this prescription was added. Make sure  you understand how and when to take each.  Indication:  Insulin-Dependent Diabetes   insulin glargine 100 UNIT/ML injection Commonly known as:  LANTUS Inject 0.08 mLs (8 Units total) into the skin 2 (two) times daily. What changed:    how much to take  when to take this  Another medication with the same name was removed. Continue taking this medication, and follow the directions you see here.  Indication:  Insulin-Dependent Diabetes   isosorbide-hydrALAZINE 20-37.5 MG tablet Commonly known as:  BIDIL Take 1 tablet by mouth 3 (three) times daily.  Indication:  Heart Failure   levETIRAcetam 250 MG tablet Commonly known as:  KEPPRA Take 1 tablet (250 mg total) by mouth every Tuesday, Thursday, and Saturday at 6 PM.  Indication:  Seizure   levETIRAcetam 500 MG tablet Commonly known as:  KEPPRA Take 1 tablet (500 mg total) by mouth daily at 6 (six) AM.  Indication:  Seizure   minoxidil 2.5 MG tablet Commonly known as:  LONITEN Take 2 tablets (5 mg total) by mouth daily at 6 PM. What changed:    how much to take  when to take this  Indication:  High Blood Pressure Disorder   multivitamin Tabs tablet Take 1 tablet by mouth at bedtime. What changed:  when to take this  Indication:  Vitamin Deficiency   NIFEdipine 30 MG 24 hr tablet Commonly known as:  ADALAT CC Take 1 tablet (30 mg total) by mouth 2 (two) times daily.  Indication:  High Blood Pressure Disorder   risperiDONE 1 MG tablet Commonly known as:  RISPERDAL Take 1 tablet (1 mg total) by mouth 2 (two) times daily.  Indication:  Major Depressive Disorder        Follow-up recommendations:  Activity:  Activity as tolerated but gentle and do not over exert oneself Diet:  Limited diet that  needs to be carefully monitored because of diabetes. Other:  Patient needs to make sure he is following up with outpatient care maintains medication including antiseizure medicine antipsychotics and antidepressants as well as medicines for his high blood pressure and diabetes  Comments: Patient needs to make sure he is getting very careful follow-up for his severe medical problems as well as for his mental health issues  Signed: Alethia Berthold, MD 04/11/2018, 9:05 AM

## 2018-04-11 NOTE — Progress Notes (Signed)
Recreation Therapy Notes  INPATIENT RECREATION TR PLAN  Patient Details Name: Edward Colon MRN: [id number] DOB: 01-12-1981 Today's Date: 04/11/2018  Rec Therapy Plan Is patient appropriate for Therapeutic Recreation?: Yes Treatment times per week: At least 3 Estimated Length of Stay: 5-7 days TR Treatment/Interventions: Group participation (Comment)  Discharge Criteria Pt will be discharged from therapy if:: Discharged Treatment plan/goals/alternatives discussed and agreed upon by:: Patient/family  Discharge Summary Short term goals set: Patient will engage in groups without prompting or encouragement from LRT x3 group sessions within 5 recreation therapy group sessions Short term goals met: Not met Progress toward goals comments: Groups attended Which groups?: Other (Comment)(Team work , Engineer, water) Reason goals not met: Patient spent most of his time in his room  Therapeutic equipment acquired: N/A Reason patient discharged from therapy: Discharge from hospital Pt/family agrees with progress & goals achieved: Yes Date patient discharged from therapy: 04/11/18   Gracia Saggese 04/11/2018, 12:14 PM

## 2018-04-11 NOTE — [facility] Group Notes (Signed)
LCSW Group Therapy Note  04/11/2018 1:52 PM  Type of Therapy/Topic:  Group Therapy:  Emotion Regulation  Participation Level:  Did Not Attend. Pt invited, chose not to attend.   Description of Group:   The purpose of this group is to assist patients in learning to regulate negative emotions and experience positive emotions. Patients will be guided to discuss ways in which they have been vulnerable to their negative emotions. These vulnerabilities will be juxtaposed with experiences of positive emotions or situations, and patients will be challenged to use positive emotions to combat negative ones. Special emphasis will be placed on coping with negative emotions in conflict situations, and patients will process healthy conflict resolution skills.  Therapeutic Goals: 1. Patient will identify two positive emotions or experiences to reflect on in order to balance out negative emotions 2. Patient will label two or more emotions that they find the most difficult to experience 3. Patient will demonstrate positive conflict resolution skills through discussion and/or role plays  Summary of Patient Progress: x   Therapeutic Modalities:   Cognitive Behavioral Therapy Feelings Identification Dialectical Behavioral Therapy   Evalina Field, MSW, LCSW Clinical Social Work 04/11/2018 1:52 PM

## 2018-04-12 ENCOUNTER — Encounter (HOSPITAL_COMMUNITY): Payer: Self-pay | Admitting: *Deleted

## 2018-04-12 ENCOUNTER — Emergency Department (HOSPITAL_COMMUNITY)
Admission: EM | Admit: 2018-04-12 | Discharge: 2018-04-13 | Disposition: A | Discharge status: Home / Self Care | Payer: Medicare Other | Attending: Emergency Medicine | Admitting: Emergency Medicine

## 2018-04-12 ENCOUNTER — Emergency Department (HOSPITAL_COMMUNITY): Payer: Medicare Other

## 2018-04-12 DIAGNOSIS — E1065 Type 1 diabetes mellitus with hyperglycemia: Secondary | ICD-10-CM | POA: Diagnosis not present

## 2018-04-12 DIAGNOSIS — N186 End stage renal disease: Secondary | ICD-10-CM | POA: Insufficient documentation

## 2018-04-12 DIAGNOSIS — Z794 Long term (current) use of insulin: Secondary | ICD-10-CM | POA: Diagnosis not present

## 2018-04-12 DIAGNOSIS — R0789 Other chest pain: Secondary | ICD-10-CM | POA: Diagnosis not present

## 2018-04-12 DIAGNOSIS — I120 Hypertensive chronic kidney disease with stage 5 chronic kidney disease or end stage renal disease: Secondary | ICD-10-CM | POA: Insufficient documentation

## 2018-04-12 DIAGNOSIS — D509 Iron deficiency anemia, unspecified: Secondary | ICD-10-CM | POA: Diagnosis not present

## 2018-04-12 DIAGNOSIS — E8779 Other fluid overload: Secondary | ICD-10-CM | POA: Diagnosis not present

## 2018-04-12 DIAGNOSIS — R079 Chest pain, unspecified: Secondary | ICD-10-CM | POA: Diagnosis not present

## 2018-04-12 DIAGNOSIS — E1129 Type 2 diabetes mellitus with other diabetic kidney complication: Secondary | ICD-10-CM | POA: Diagnosis not present

## 2018-04-12 DIAGNOSIS — R739 Hyperglycemia, unspecified: Secondary | ICD-10-CM

## 2018-04-12 DIAGNOSIS — D631 Anemia in chronic kidney disease: Secondary | ICD-10-CM | POA: Diagnosis not present

## 2018-04-12 DIAGNOSIS — E1165 Type 2 diabetes mellitus with hyperglycemia: Secondary | ICD-10-CM | POA: Diagnosis not present

## 2018-04-12 DIAGNOSIS — E876 Hypokalemia: Secondary | ICD-10-CM | POA: Diagnosis not present

## 2018-04-12 DIAGNOSIS — Z5321 Procedure and treatment not carried out due to patient leaving prior to being seen by health care provider: Secondary | ICD-10-CM

## 2018-04-12 DIAGNOSIS — R0902 Hypoxemia: Secondary | ICD-10-CM | POA: Diagnosis not present

## 2018-04-12 DIAGNOSIS — R0689 Other abnormalities of breathing: Secondary | ICD-10-CM | POA: Diagnosis not present

## 2018-04-12 DIAGNOSIS — N2581 Secondary hyperparathyroidism of renal origin: Secondary | ICD-10-CM | POA: Diagnosis not present

## 2018-04-12 DIAGNOSIS — E162 Hypoglycemia, unspecified: Secondary | ICD-10-CM | POA: Diagnosis not present

## 2018-04-12 DIAGNOSIS — E875 Hyperkalemia: Secondary | ICD-10-CM

## 2018-04-12 HISTORY — DX: Cardiac arrest, cause unspecified: I46.9

## 2018-04-12 LAB — I-STAT TROPONIN, ED: TROPONIN I, POC: 0.02 ng/mL (ref 0.00–0.08)

## 2018-04-12 MED ORDER — SODIUM CHLORIDE 0.9% FLUSH: 3.0000 mL | Freq: Once | INTRAVENOUS | Status: DC

## 2018-04-12 NOTE — ED Triage Notes (Signed)
Pt was at the pharmacy waiting to get his insulin filled, started having cp. EMS gave nitro x1 and 324mg  asa. Pt denies chest pain at present. Has hx of cardiac arrest. Had full treatment of dialysis today. EKG showing peaked t waves. Also noted to be diaphoretic in triage

## 2018-04-12 NOTE — ED Provider Notes (Addendum)
[facility] EMERGENCY DEPARTMENT Provider Note   CSN: [id number] Arrival date & time: 04/12/18  2316     History   Chief Complaint Chief Complaint  Patient presents with  . Chest Pain    HPI Edward Colon is a 38 y.o. male.  HPI   38 yo  With PMHx ESRD, T1D, CAD here with neck pressure and gen weakness. Pt reports that he was waiting at the pharmacy today when he developed gen weakness, neck pressure. He states the pressure was a dull, aching, pressure like sensation in his neck. No SOB. No diaphoresis, n/v. He called EMS and was noted to have HIGH blood glucose, as well as chest pressure w/ distress. He was given nitro w/ improvement in pressure. He now feels better. He did give himself insulin prior to EMS arrival. No recent fever or chills. No other sx. Pt did sit for a full session of HD today. No other complaints other than mild thirst.  Past Medical History:  Diagnosis Date  . Anemia   . Blind left eye since ~ 2010  . Cardiac arrest ([place])   . Depression   . Diabetic peripheral neuropathy ([place])   . ESRD (end stage renal disease) on dialysis [facility])    "TTS; [place] [facility]; [facility]" (02/01/2016)  . Heart murmur    denies any problems with it  . Hypertension   . Seizures ([place])    "last one was end of 2016; they are related to my diabetes" (02/01/2016)  . Type 1 diabetes ([place]) dx'd 1990    Patient Active Problem List   Diagnosis Date Noted  . MDD (major depressive disorder), recurrent severe, without psychosis ([place]) 03/28/2018  . Major depressive disorder, single episode, severe ([place]) 03/25/2018  . Diabetic neuropathy ([place])   . Blindness of left eye with normal vision in contralateral eye   . Agitation   . Encephalopathy   . Hypertensive emergency 02/15/2018  . Encephalopathy, hypertensive 02/15/2018  . Redness of left eye 02/15/2018  . Altered mental status 02/08/2018  . Cardiac arrest ([place])   . DKA (diabetic ketoacidoses) ([place])  03/05/2017  . Ventricular tachycardia ([place]) 03/05/2017  . Acute respiratory failure ([place])   . Involuntary commitment 01/29/2016  . Suicide attempt by substance overdose ([place]) 01/28/2016  . ESRD on dialysis ([place]) 03/07/2015  . Hyperglycemia 03/06/2015  . Acute on chronic renal failure ([place]) 02/27/2015  . URI (upper respiratory infection) 02/27/2015  . Acute-on-chronic kidney injury ([place]) 02/27/2015  . Rhabdomyolysis 08/31/2014  . Hypoglycemia 08/30/2014  . Type 1 diabetes ([place]) 08/30/2014  . CKD stage 2 due to type 2 diabetes mellitus ([place]) 08/30/2014  . Anemia 08/30/2014  . Swelling of arm 05/31/2013  . ARF (acute renal failure) ([place]) 05/31/2013  . Hyperkalemia 05/31/2013  . Seizure ([place]) 05/31/2013  . Seizures ([place])   . Hypertension     Past Surgical History:  Procedure Laterality Date  . AV FISTULA PLACEMENT Right 03/03/2015   Procedure: RIGHT RADIO-CEPHALIC ARTERIOVENOUS (AV) FISTULA CREATION;  Surgeon: Serafina Mitchell, MD;  Location: [facility] OR;  Service: Vascular;  Laterality: Right;  . AV FISTULA PLACEMENT Left 06/15/2015   Procedure: INSERTION OF LEFT UPPER ARM  ARTERIOVENOUS (AV) 55mm x 50cm GORE-TEX GRAFT;  Surgeon: Elam Dutch, MD;  Location: [facility];  Service: Vascular;  Laterality: Left;  . BASCILIC VEIN TRANSPOSITION Left 04/01/2015   Procedure: BASCILIC VEIN TRANSPOSITION-LEFT ARM- FIRST STAGE;  Surgeon: Elam Dutch, MD;  Location: [facility];  Service: Vascular;  Laterality: Left;  . EYE SURGERY Right ~ 2010   for diabetic retinopathy  . INSERTION OF DIALYSIS CATHETER Right 03/03/2015   Procedure: INSERTION OF DIALYSIS CATHETER;  Surgeon: Serafina Mitchell, MD;  Location: [facility] OR;  Service: Vascular;  Laterality: Right;        Home Medications    Prior to Admission medications   Medication Sig Start Date End Date Taking? Authorizing Provider  calcium acetate (PHOSLO) 667 MG capsule Take 3 capsules (2,001 mg total) by mouth 3 (three) times daily with meals. 04/10/18    [name], [name], MD  cinacalcet (SENSIPAR) 30 MG tablet Take 3 tablets (90 mg total) by mouth Every Tuesday,Thursday,and Saturday with dialysis. 04/10/18   [name], [name], MD  FLUoxetine (PROZAC) 20 MG capsule Take 1 capsule (20 mg total) by mouth daily. 04/10/18   [name], [name], MD  insulin aspart (NOVOLOG) 100 UNIT/ML injection Inject 0-9 Units into the skin 3 (three) times daily with meals for 30 days. For glucose 121 to 150 use one unit, for 151 to 200 use two units, for 201 to 250 use three units, for 251 to 300 use five units, for 301 to 350 use seven units, for 351 or greater use 9 units. 03/24/18 04/23/18  Arrien, Jimmy Picket, MD  insulin aspart (NOVOLOG) 100 UNIT/ML injection Inject 5 Units into the skin 3 (three) times daily with meals. 04/10/18   [name], [name], MD  insulin glargine (LANTUS) 100 UNIT/ML injection Inject 0.08 mLs (8 Units total) into the skin 2 (two) times daily. 04/10/18   [name], [name], MD  isosorbide-hydrALAZINE (BIDIL) 20-37.5 MG tablet Take 1 tablet by mouth 3 (three) times daily. 04/10/18   [name], [name], MD  levETIRAcetam (KEPPRA) 250 MG tablet Take 1 tablet (250 mg total) by mouth every Tuesday, Thursday, and Saturday at 6 PM. 04/10/18   [name], [name], MD  levETIRAcetam (KEPPRA) 500 MG tablet Take 1 tablet (500 mg total) by mouth daily at 6 (six) AM. 04/11/18   [name], [name], MD  minoxidil (LONITEN) 2.5 MG tablet Take 2 tablets (5 mg total) by mouth daily at 6 PM. 04/10/18   [name], [name], MD  multivitamin (RENA-VIT) TABS tablet Take 1 tablet by mouth at bedtime. 04/10/18   [name], [name], MD  NIFEdipine (ADALAT CC) 30 MG 24 hr tablet Take 1 tablet (30 mg total) by mouth 2 (two) times daily. 04/10/18   [name], [name], MD  risperiDONE (RISPERDAL) 1 MG tablet Take 1 tablet (1 mg total) by mouth 2 (two) times daily. 04/10/18   [name], [name], MD    Family History Family History  Problem Relation Age of Onset  . Diabetes Neg Hx     Social History Social History    Tobacco Use  . Smoking status: Never Smoker  . Smokeless tobacco: Never Used  Substance Use Topics  . Alcohol use: No    Frequency: Never    Comment: 02/01/2016 "might have 1 drink/year"  . Drug use: No     Allergies   Patient has no known allergies.   Review of Systems Review of Systems  Constitutional: Positive for fatigue. Negative for chills and fever.  HENT: Negative for congestion and rhinorrhea.   Eyes: Negative for visual disturbance.  Respiratory: Positive for chest tightness and shortness of breath. Negative for cough and wheezing.   Cardiovascular: Negative for chest pain and leg swelling.  Gastrointestinal: Negative for abdominal pain, diarrhea, nausea and vomiting.  Genitourinary:  Negative for dysuria and flank pain.  Musculoskeletal: Positive for neck pain. Negative for neck stiffness.  Skin: Negative for rash and wound.  Allergic/Immunologic: Negative for immunocompromised state.  Neurological: Positive for weakness. Negative for syncope and headaches.  All other systems reviewed and are negative.    Physical Exam Updated Vital Signs BP (!) 185/90 (BP Location: Right Arm)   Pulse 96   Temp 98.1 F (36.7 C) (Oral)   Resp 19   SpO2 98%   Physical Exam Vitals signs and nursing note reviewed.  Constitutional:      General: He is not in acute distress.    Appearance: He is well-developed.  HENT:     Head: Normocephalic and atraumatic.     Comments: Mildly dry MM Eyes:     Conjunctiva/sclera: Conjunctivae normal.  Neck:     Musculoskeletal: Neck supple.  Cardiovascular:     Rate and Rhythm: Normal rate and regular rhythm.     Heart sounds: Normal heart sounds. No murmur. No friction rub.  Pulmonary:     Effort: Pulmonary effort is normal. No respiratory distress.     Breath sounds: Normal breath sounds. No wheezing or rales.  Abdominal:     General: There is no distension.     Palpations: Abdomen is soft.     Tenderness: There is no abdominal  tenderness.  Skin:    General: Skin is warm.     Capillary Refill: Capillary refill takes less than 2 seconds.  Neurological:     General: No focal deficit present.     Mental Status: He is alert and oriented to person, place, and time.     Motor: No abnormal muscle tone.     Comments: Awake, alert, in NAD.      ED Treatments / Results  Labs (all labs ordered are listed, but only abnormal results are displayed) Labs Reviewed  BASIC METABOLIC PANEL - Abnormal; Notable for the following components:      Result Value   Sodium 119 (*)    Potassium 7.0 (*)    Chloride 80 (*)    Glucose, Bld 1,189 (*)    BUN 49 (*)    Creatinine, Ser 8.55 (*)    Calcium 7.4 (*)    GFR calc non Af Amer 7 (*)    GFR calc Af Amer 8 (*)    Anion gap 16 (*)    All other components within normal limits  CBC - Abnormal; Notable for the following components:   WBC 3.7 (*)    RBC 3.43 (*)    Hemoglobin 9.4 (*)    HCT 32.4 (*)    MCHC 29.0 (*)    All other components within normal limits  TROPONIN I  BLOOD GAS, VENOUS  I-STAT TROPONIN, ED  CBG MONITORING, ED    EKG EKG Interpretation  Date/Time:  Thursday April 12 2018 23:20:33 EST Ventricular Rate:  99 PR Interval:  168 QRS Duration: 116 QT Interval:  388 QTC Calculation: 497 R Axis:   45 Text Interpretation:  Sinus rhythm with Premature atrial complexes Prolonged QT Abnormal ECG Since last EKG, T waves remain peaked Otherwise no significant change Confirmed by Duffy Bruce [phone]) on 04/12/2018 11:27:24 PM   Radiology Dg Chest Portable 1 View  Result Date: 04/12/2018 CLINICAL DATA:  Episode of chest pain. Previous history of cardiac arrest. Dialysis treatment today. EXAM: PORTABLE CHEST 1 VIEW COMPARISON:  03/16/2018 FINDINGS: Cardiac enlargement. No vascular congestion, edema, or consolidation. No blunting of  costophrenic angles. No pneumothorax. Mediastinal contours appear intact. IMPRESSION: Cardiac enlargement. No evidence of  active pulmonary disease. Electronically Signed   By: Lucienne Capers M.D.   On: 04/12/2018 23:48    Procedures .Critical Care Performed by: Duffy Bruce, MD Authorized by: Duffy Bruce, MD   Critical care provider statement:    Critical care time (minutes):  35   Critical care time was exclusive of:  Separately billable procedures and treating other patients and teaching time   Critical care was necessary to treat or prevent imminent or life-threatening deterioration of the following conditions:  Circulatory failure, cardiac failure and metabolic crisis   Critical care was time spent personally by me on the following activities:  Development of treatment plan with patient or surrogate, discussions with consultants, evaluation of patient's response to treatment, examination of patient, obtaining history from patient or surrogate, ordering and performing treatments and interventions, ordering and review of laboratory studies, ordering and review of radiographic studies, pulse oximetry, re-evaluation of patient's condition and review of old charts   I assumed direction of critical care for this patient from another provider in my specialty: no     (including critical care time)  Medications Ordered in ED Medications  sodium chloride flush (NS) 0.9 % injection 3 mL (has no administration in time range)  calcium gluconate 1 g in sodium chloride 0.9 % 100 mL IVPB (has no administration in time range)  insulin aspart (novoLOG) injection 5 Units (has no administration in time range)  dextrose 5 %-0.45 % sodium chloride infusion (has no administration in time range)  insulin regular, human (MYXREDLIN) 100 units/ 100 mL infusion (has no administration in time range)  albuterol (PROVENTIL) (2.5 MG/3ML) 0.083% nebulizer solution 5 mg (has no administration in time range)     Initial Impression / Assessment and Plan / ED Course  I have reviewed the triage vital signs and the nursing  notes.  Pertinent labs & imaging results that were available during my care of the patient were reviewed by me and considered in my medical decision making (see chart for details).  Clinical Course as of Apr 14 19  Fri Apr 13, 2018  0007 Potassium(!!): 7.0 [CI]  0013 38 yo M here w/ generalized weakness, transient neck pressure while awaiting insulin at Pharmacy. Pt HDS on arrival - EKG w/ peaked T waves but otherwise no ischemic. Initial BMP c/f marked hyperkalemia, hyperglycemia. Pt adamant he sat for his full session today.   [CI]  0017 Discussed results with pt, girlfriend. I discussed that his potassium, blood sugar are emergently, critically elevated and that he is at HIGH risk of death if not treated emergently, as in right now. Pt refuses any and all treatment. He states he has "things to do" and "feels better." He states he now has his own insulin at home. I discussed that regardless of whether he has it, he needs to stay for IV treatment and is at high risk of death and decompensation. He again refuses any and all treatment. He is awake, alert, and expresses full understanding of his HIGH risk of decompensation, death, and disability. He understands he will need emergent dialysis, insulin, and close monitoring. Pt eloped prior to signing [place] paperwork but I was able to reach him on the phone, refused coming back despite addressing his concerns and explaining that he is at extremely high risk of immediate death.    [CI]    Clinical Course User Index [CI] Duffy Bruce, MD  ADDENDUM: Called pt again, who again refuses. I offered alternative treatments including temporary doses of calcium/temporizing tx in ED with insulin, admission, observation, and he again refuses any and all tx despite understanding the emergency of his condition. Significant other aware as well.   Final Clinical Impressions(s) / ED Diagnoses   Final diagnoses:  Eloped from emergency department  Hyperglycemia   Hyperkalemia    ED Discharge Orders    None       Duffy Bruce, MD 04/13/18 Justice Rocher, MD 04/13/18 [phone]

## 2018-04-13 LAB — BASIC METABOLIC PANEL
Anion gap: 16 — ABNORMAL HIGH (ref 5–15)
BUN: 49 mg/dL — ABNORMAL HIGH (ref 6–20)
CO2: 23 mmol/L (ref 22–32)
Calcium: 7.4 mg/dL — ABNORMAL LOW (ref 8.9–10.3)
Chloride: 80 mmol/L — ABNORMAL LOW (ref 98–111)
Creatinine, Ser: 8.55 mg/dL — ABNORMAL HIGH (ref 0.61–1.24)
GFR calc Af Amer: 8 mL/min — ABNORMAL LOW (ref >60)
GFR calc non Af Amer: 7 mL/min — ABNORMAL LOW (ref >60)
GLUCOSE: 1189 mg/dL — AB (ref 70–99)
Potassium: 7 mmol/L (ref 3.5–5.1)
Sodium: 119 mmol/L — CL (ref 135–145)

## 2018-04-13 LAB — CBC
HCT: 32.4 % — ABNORMAL LOW (ref 39.0–52.0)
Hemoglobin: 9.4 g/dL — ABNORMAL LOW (ref 13.0–17.0)
MCH: 27.4 pg (ref 26.0–34.0)
MCHC: 29 g/dL — ABNORMAL LOW (ref 30.0–36.0)
MCV: 94.5 fL (ref 80.0–100.0)
Platelets: 289 10*3/uL (ref 150–400)
RBC: 3.43 MIL/uL — AB (ref 4.22–5.81)
RDW: 12.4 % (ref 11.5–15.5)
WBC: 3.7 10*3/uL — ABNORMAL LOW (ref 4.0–10.5)
nRBC: 0 % (ref 0.0–0.2)

## 2018-04-13 LAB — TROPONIN I: Troponin I: <0.03 ng/mL (ref <0.03)

## 2018-04-13 MED ORDER — INSULIN ASPART 100 UNIT/ML ~~LOC~~ SOLN: 5.0000 [IU] | Freq: Once | SUBCUTANEOUS | Status: DC

## 2018-04-13 MED ORDER — SODIUM CHLORIDE 0.9 % IV SOLN
1.0000 g | Freq: Once | INTRAVENOUS | Status: DC
  Filled 2018-04-13: qty 10

## 2018-04-13 MED ORDER — ALBUTEROL SULFATE (2.5 MG/3ML) 0.083% IN NEBU: 5.0000 mg | INHALATION_SOLUTION | Freq: Once | RESPIRATORY_TRACT | Status: DC

## 2018-04-13 MED ORDER — DEXTROSE-NACL 5-0.45 % IV SOLN: INTRAVENOUS | Status: DC

## 2018-04-13 MED ORDER — INSULIN REGULAR(HUMAN) IN NACL 100-0.9 UT/100ML-% IV SOLN: INTRAVENOUS | Status: DC

## 2018-04-13 NOTE — ED Notes (Signed)
Pt eloped. Dr Ellender Hose called pt and encouraged him to return and pt declined. He discussed with pt in length about the risks of not returning and pt still stating he will not be coming back.

## 2018-04-14 DIAGNOSIS — E162 Hypoglycemia, unspecified: Secondary | ICD-10-CM | POA: Diagnosis not present

## 2018-04-14 DIAGNOSIS — N186 End stage renal disease: Secondary | ICD-10-CM | POA: Diagnosis not present

## 2018-04-14 DIAGNOSIS — N2581 Secondary hyperparathyroidism of renal origin: Secondary | ICD-10-CM | POA: Diagnosis not present

## 2018-04-14 DIAGNOSIS — E8779 Other fluid overload: Secondary | ICD-10-CM | POA: Diagnosis not present

## 2018-04-14 DIAGNOSIS — D509 Iron deficiency anemia, unspecified: Secondary | ICD-10-CM | POA: Diagnosis not present

## 2018-04-14 DIAGNOSIS — D631 Anemia in chronic kidney disease: Secondary | ICD-10-CM | POA: Diagnosis not present

## 2018-04-17 DIAGNOSIS — N2581 Secondary hyperparathyroidism of renal origin: Secondary | ICD-10-CM | POA: Diagnosis not present

## 2018-04-17 DIAGNOSIS — E162 Hypoglycemia, unspecified: Secondary | ICD-10-CM | POA: Diagnosis not present

## 2018-04-17 DIAGNOSIS — D631 Anemia in chronic kidney disease: Secondary | ICD-10-CM | POA: Diagnosis not present

## 2018-04-17 DIAGNOSIS — E8779 Other fluid overload: Secondary | ICD-10-CM | POA: Diagnosis not present

## 2018-04-17 DIAGNOSIS — N186 End stage renal disease: Secondary | ICD-10-CM | POA: Diagnosis not present

## 2018-04-17 DIAGNOSIS — D509 Iron deficiency anemia, unspecified: Secondary | ICD-10-CM | POA: Diagnosis not present

## 2018-04-19 DIAGNOSIS — N2581 Secondary hyperparathyroidism of renal origin: Secondary | ICD-10-CM | POA: Diagnosis not present

## 2018-04-19 DIAGNOSIS — E8779 Other fluid overload: Secondary | ICD-10-CM | POA: Diagnosis not present

## 2018-04-19 DIAGNOSIS — D631 Anemia in chronic kidney disease: Secondary | ICD-10-CM | POA: Diagnosis not present

## 2018-04-19 DIAGNOSIS — E162 Hypoglycemia, unspecified: Secondary | ICD-10-CM | POA: Diagnosis not present

## 2018-04-19 DIAGNOSIS — D509 Iron deficiency anemia, unspecified: Secondary | ICD-10-CM | POA: Diagnosis not present

## 2018-04-19 DIAGNOSIS — N186 End stage renal disease: Secondary | ICD-10-CM | POA: Diagnosis not present

## 2018-04-21 DIAGNOSIS — N186 End stage renal disease: Secondary | ICD-10-CM | POA: Diagnosis not present

## 2018-04-21 DIAGNOSIS — E162 Hypoglycemia, unspecified: Secondary | ICD-10-CM | POA: Diagnosis not present

## 2018-04-21 DIAGNOSIS — D631 Anemia in chronic kidney disease: Secondary | ICD-10-CM | POA: Diagnosis not present

## 2018-04-21 DIAGNOSIS — D509 Iron deficiency anemia, unspecified: Secondary | ICD-10-CM | POA: Diagnosis not present

## 2018-04-21 DIAGNOSIS — N2581 Secondary hyperparathyroidism of renal origin: Secondary | ICD-10-CM | POA: Diagnosis not present

## 2018-04-21 DIAGNOSIS — E8779 Other fluid overload: Secondary | ICD-10-CM | POA: Diagnosis not present

## 2018-04-22 ENCOUNTER — Other Ambulatory Visit: Payer: Self-pay

## 2018-04-22 ENCOUNTER — Inpatient Hospital Stay (HOSPITAL_COMMUNITY)
Admission: EM | Admit: 2018-04-22 | Discharge: 2018-05-03 | DRG: 637 | Disposition: A | Discharge status: Home / Self Care | Payer: Medicare Other | Attending: Internal Medicine | Admitting: Internal Medicine

## 2018-04-22 DIAGNOSIS — E10649 Type 1 diabetes mellitus with hypoglycemia without coma: Secondary | ICD-10-CM | POA: Diagnosis present

## 2018-04-22 DIAGNOSIS — E1069 Type 1 diabetes mellitus with other specified complication: Secondary | ICD-10-CM | POA: Diagnosis not present

## 2018-04-22 DIAGNOSIS — F329 Major depressive disorder, single episode, unspecified: Secondary | ICD-10-CM

## 2018-04-22 DIAGNOSIS — Z915 Personal history of self-harm: Secondary | ICD-10-CM

## 2018-04-22 DIAGNOSIS — D631 Anemia in chronic kidney disease: Secondary | ICD-10-CM

## 2018-04-22 DIAGNOSIS — F332 Major depressive disorder, recurrent severe without psychotic features: Secondary | ICD-10-CM | POA: Diagnosis present

## 2018-04-22 DIAGNOSIS — I120 Hypertensive chronic kidney disease with stage 5 chronic kidney disease or end stage renal disease: Secondary | ICD-10-CM | POA: Diagnosis present

## 2018-04-22 DIAGNOSIS — Z8674 Personal history of sudden cardiac arrest: Secondary | ICD-10-CM | POA: Diagnosis not present

## 2018-04-22 DIAGNOSIS — E871 Hypo-osmolality and hyponatremia: Secondary | ICD-10-CM

## 2018-04-22 DIAGNOSIS — N186 End stage renal disease: Secondary | ICD-10-CM

## 2018-04-22 DIAGNOSIS — G9341 Metabolic encephalopathy: Secondary | ICD-10-CM | POA: Diagnosis not present

## 2018-04-22 DIAGNOSIS — R4182 Altered mental status, unspecified: Secondary | ICD-10-CM | POA: Diagnosis not present

## 2018-04-22 DIAGNOSIS — F32A Depression, unspecified: Secondary | ICD-10-CM

## 2018-04-22 DIAGNOSIS — E1010 Type 1 diabetes mellitus with ketoacidosis without coma: Secondary | ICD-10-CM

## 2018-04-22 DIAGNOSIS — Z91128 Patient's intentional underdosing of medication regimen for other reason: Secondary | ICD-10-CM

## 2018-04-22 DIAGNOSIS — I161 Hypertensive emergency: Secondary | ICD-10-CM | POA: Diagnosis not present

## 2018-04-22 DIAGNOSIS — G9340 Encephalopathy, unspecified: Secondary | ICD-10-CM

## 2018-04-22 DIAGNOSIS — I10 Essential (primary) hypertension: Secondary | ICD-10-CM | POA: Diagnosis not present

## 2018-04-22 DIAGNOSIS — R531 Weakness: Secondary | ICD-10-CM | POA: Diagnosis not present

## 2018-04-22 DIAGNOSIS — E1165 Type 2 diabetes mellitus with hyperglycemia: Secondary | ICD-10-CM | POA: Diagnosis not present

## 2018-04-22 DIAGNOSIS — T383X6A Underdosing of insulin and oral hypoglycemic [antidiabetic] drugs, initial encounter: Secondary | ICD-10-CM | POA: Diagnosis present

## 2018-04-22 DIAGNOSIS — Z794 Long term (current) use of insulin: Secondary | ICD-10-CM

## 2018-04-22 DIAGNOSIS — Z9119 Patient's noncompliance with other medical treatment and regimen: Secondary | ICD-10-CM

## 2018-04-22 DIAGNOSIS — E875 Hyperkalemia: Secondary | ICD-10-CM | POA: Diagnosis not present

## 2018-04-22 DIAGNOSIS — Z992 Dependence on renal dialysis: Secondary | ICD-10-CM | POA: Diagnosis not present

## 2018-04-22 DIAGNOSIS — Z9189 Other specified personal risk factors, not elsewhere classified: Secondary | ICD-10-CM | POA: Diagnosis not present

## 2018-04-22 DIAGNOSIS — G40909 Epilepsy, unspecified, not intractable, without status epilepticus: Secondary | ICD-10-CM | POA: Diagnosis present

## 2018-04-22 DIAGNOSIS — E1011 Type 1 diabetes mellitus with ketoacidosis with coma: Principal | ICD-10-CM | POA: Diagnosis present

## 2018-04-22 DIAGNOSIS — E109 Type 1 diabetes mellitus without complications: Secondary | ICD-10-CM

## 2018-04-22 DIAGNOSIS — G92 Toxic encephalopathy: Secondary | ICD-10-CM | POA: Diagnosis present

## 2018-04-22 DIAGNOSIS — N2581 Secondary hyperparathyroidism of renal origin: Secondary | ICD-10-CM | POA: Diagnosis present

## 2018-04-22 DIAGNOSIS — F3289 Other specified depressive episodes: Secondary | ICD-10-CM | POA: Diagnosis not present

## 2018-04-22 DIAGNOSIS — Z79899 Other long term (current) drug therapy: Secondary | ICD-10-CM | POA: Diagnosis not present

## 2018-04-22 DIAGNOSIS — R739 Hyperglycemia, unspecified: Secondary | ICD-10-CM | POA: Diagnosis not present

## 2018-04-22 DIAGNOSIS — Z23 Encounter for immunization: Secondary | ICD-10-CM

## 2018-04-22 DIAGNOSIS — R000 Tachycardia, unspecified: Secondary | ICD-10-CM | POA: Diagnosis not present

## 2018-04-22 DIAGNOSIS — E1022 Type 1 diabetes mellitus with diabetic chronic kidney disease: Secondary | ICD-10-CM | POA: Diagnosis not present

## 2018-04-22 DIAGNOSIS — E1310 Other specified diabetes mellitus with ketoacidosis without coma: Secondary | ICD-10-CM

## 2018-04-22 DIAGNOSIS — R569 Unspecified convulsions: Secondary | ICD-10-CM

## 2018-04-22 DIAGNOSIS — E0810 Diabetes mellitus due to underlying condition with ketoacidosis without coma: Secondary | ICD-10-CM | POA: Diagnosis not present

## 2018-04-22 DIAGNOSIS — E1042 Type 1 diabetes mellitus with diabetic polyneuropathy: Secondary | ICD-10-CM | POA: Diagnosis present

## 2018-04-22 DIAGNOSIS — R0902 Hypoxemia: Secondary | ICD-10-CM | POA: Diagnosis not present

## 2018-04-22 LAB — CBC
HCT: 28.2 % — ABNORMAL LOW (ref 39.0–52.0)
Hemoglobin: 8.7 g/dL — ABNORMAL LOW (ref 13.0–17.0)
MCH: 27.4 pg (ref 26.0–34.0)
MCHC: 30.9 g/dL (ref 30.0–36.0)
MCV: 89 fL (ref 80.0–100.0)
Platelets: 269 10*3/uL (ref 150–400)
RBC: 3.17 MIL/uL — ABNORMAL LOW (ref 4.22–5.81)
RDW: 13.2 % (ref 11.5–15.5)
WBC: 10.2 10*3/uL (ref 4.0–10.5)
nRBC: 0 % (ref 0.0–0.2)

## 2018-04-22 LAB — BLOOD GAS, VENOUS
ACID-BASE DEFICIT: 11.7 mmol/L — AB (ref 0.0–2.0)
BICARBONATE: 14.3 mmol/L — AB (ref 20.0–28.0)
FIO2: 21
O2 Saturation: 76.5 %
Patient temperature: 37
pCO2, Ven: 50.3 mmHg (ref 44.0–60.0)
pH, Ven: 7.127 — CL (ref 7.250–7.430)
pO2, Ven: 52.3 mmHg — ABNORMAL HIGH (ref 32.0–45.0)

## 2018-04-22 LAB — BASIC METABOLIC PANEL
Anion gap: 28 — ABNORMAL HIGH (ref 5–15)
BUN: 78 mg/dL — ABNORMAL HIGH (ref 6–20)
CO2: 15 mmol/L — ABNORMAL LOW (ref 22–32)
Calcium: 7.9 mg/dL — ABNORMAL LOW (ref 8.9–10.3)
Chloride: 70 mmol/L — ABNORMAL LOW (ref 98–111)
Creatinine, Ser: 11.29 mg/dL — ABNORMAL HIGH (ref 0.61–1.24)
GFR calc Af Amer: 6 mL/min — ABNORMAL LOW (ref >60)
GFR calc non Af Amer: 5 mL/min — ABNORMAL LOW (ref >60)
Glucose, Bld: 1649 mg/dL (ref 70–99)
Potassium: >7.5 mmol/L (ref 3.5–5.1)
Sodium: 113 mmol/L — CL (ref 135–145)

## 2018-04-22 LAB — LACTIC ACID, PLASMA: Lactic Acid, Venous: 10 mmol/L (ref 0.5–1.9)

## 2018-04-22 LAB — CBC WITH DIFFERENTIAL/PLATELET
Abs Immature Granulocytes: 0.06 10*3/uL (ref 0.00–0.07)
Basophils Absolute: 0 10*3/uL (ref 0.0–0.1)
Basophils Relative: 0 %
Eosinophils Absolute: 0 10*3/uL (ref 0.0–0.5)
Eosinophils Relative: 0 %
HCT: 35.6 % — ABNORMAL LOW (ref 39.0–52.0)
Hemoglobin: 10.3 g/dL — ABNORMAL LOW (ref 13.0–17.0)
Immature Granulocytes: 1 %
Lymphocytes Relative: 6 %
Lymphs Abs: 0.8 10*3/uL (ref 0.7–4.0)
MCH: 27.9 pg (ref 26.0–34.0)
MCHC: 28.9 g/dL — ABNORMAL LOW (ref 30.0–36.0)
MCV: 96.5 fL (ref 80.0–100.0)
Monocytes Absolute: 0.9 10*3/uL (ref 0.1–1.0)
Monocytes Relative: 7 %
Neutro Abs: 10.7 10*3/uL — ABNORMAL HIGH (ref 1.7–7.7)
Neutrophils Relative %: 86 %
Platelets: 296 10*3/uL (ref 150–400)
RBC: 3.69 MIL/uL — ABNORMAL LOW (ref 4.22–5.81)
RDW: 13.3 % (ref 11.5–15.5)
WBC: 12.4 10*3/uL — ABNORMAL HIGH (ref 4.0–10.5)
nRBC: 0 % (ref 0.0–0.2)

## 2018-04-22 LAB — CBG MONITORING, ED
Glucose-Capillary: >600 mg/dL (ref 70–99)
Glucose-Capillary: >600 mg/dL (ref 70–99)
Glucose-Capillary: >600 mg/dL (ref 70–99)

## 2018-04-22 LAB — GLUCOSE, CAPILLARY: Glucose-Capillary: >600 mg/dL (ref 70–99)

## 2018-04-22 LAB — PROTIME-INR
INR: 1.21
PROTHROMBIN TIME: 15.2 s (ref 11.4–15.2)

## 2018-04-22 LAB — APTT: aPTT: 29 seconds (ref 24–36)

## 2018-04-22 MED ORDER — SODIUM CHLORIDE 0.9 % IV BOLUS
1000.0000 mL | Freq: Once | INTRAVENOUS | Status: AC
  Administered 2018-04-22: 1000 mL via INTRAVENOUS

## 2018-04-22 MED ORDER — LEVETIRACETAM 500 MG PO TABS
500.0000 mg | ORAL_TABLET | Freq: Every day | ORAL | Status: DC
  Administered 2018-04-23 – 2018-04-27 (×5): 500 mg via ORAL
  Filled 2018-04-22 (×5): qty 1

## 2018-04-22 MED ORDER — HEPARIN SODIUM (PORCINE) 1000 UNIT/ML DIALYSIS
5000.0000 [IU] | INTRAMUSCULAR | Status: AC | PRN
  Administered 2018-04-23 – 2018-04-24 (×2): 5000 [IU] via INTRAVENOUS_CENTRAL
  Filled 2018-04-22 (×3): qty 5

## 2018-04-22 MED ORDER — HEPARIN SODIUM (PORCINE) 5000 UNIT/ML IJ SOLN
5000.0000 [IU] | Freq: Three times a day (TID) | INTRAMUSCULAR | Status: DC
  Administered 2018-04-23 – 2018-05-02 (×20): 5000 [IU] via SUBCUTANEOUS
  Filled 2018-04-22 (×23): qty 1

## 2018-04-22 MED ORDER — INSULIN ASPART 100 UNIT/ML ~~LOC~~ SOLN
SUBCUTANEOUS | Status: AC
  Administered 2018-04-22: 10 [IU]
  Filled 2018-04-22: qty 1

## 2018-04-22 MED ORDER — SODIUM CHLORIDE 0.9 % IV SOLN: INTRAVENOUS | Status: AC

## 2018-04-22 MED ORDER — SODIUM CHLORIDE 0.9 % IV SOLN: INTRAVENOUS | Status: DC

## 2018-04-22 MED ORDER — ONDANSETRON HCL 4 MG/2ML IJ SOLN
4.0000 mg | Freq: Once | INTRAMUSCULAR | Status: AC
  Administered 2018-04-22: 4 mg via INTRAVENOUS
  Filled 2018-04-22: qty 2

## 2018-04-22 MED ORDER — DEXTROSE-NACL 5-0.45 % IV SOLN: INTRAVENOUS | Status: DC

## 2018-04-22 MED ORDER — INSULIN REGULAR(HUMAN) IN NACL 100-0.9 UT/100ML-% IV SOLN
INTRAVENOUS | Status: DC
  Administered 2018-04-22: 5.4 [IU]/h via INTRAVENOUS
  Administered 2018-04-22: 10.8 [IU]/h via INTRAVENOUS
  Filled 2018-04-22: qty 100

## 2018-04-22 MED ORDER — SODIUM BICARBONATE 8.4 % IV SOLN
INTRAVENOUS | Status: AC
  Filled 2018-04-22: qty 50

## 2018-04-22 MED ORDER — CHLORHEXIDINE GLUCONATE CLOTH 2 % EX PADS: 6.0000 | MEDICATED_PAD | Freq: Every day | CUTANEOUS | Status: DC

## 2018-04-22 MED ORDER — ALBUTEROL (5 MG/ML) CONTINUOUS INHALATION SOLN
10.0000 mg/h | INHALATION_SOLUTION | RESPIRATORY_TRACT | Status: DC
  Administered 2018-04-22: 10 mg/h via RESPIRATORY_TRACT
  Filled 2018-04-22: qty 20

## 2018-04-22 MED ORDER — RISPERIDONE 1 MG PO TABS
1.0000 mg | ORAL_TABLET | Freq: Two times a day (BID) | ORAL | Status: DC
  Administered 2018-04-23 – 2018-04-27 (×10): 1 mg via ORAL
  Filled 2018-04-22 (×3): qty 2
  Filled 2018-04-22 (×2): qty 1
  Filled 2018-04-22: qty 2
  Filled 2018-04-22: qty 1
  Filled 2018-04-22 (×3): qty 2
  Filled 2018-04-22: qty 1

## 2018-04-22 MED ORDER — ISOSORB DINITRATE-HYDRALAZINE 20-37.5 MG PO TABS
1.0000 | ORAL_TABLET | Freq: Three times a day (TID) | ORAL | Status: DC
  Administered 2018-04-23 – 2018-04-27 (×14): 1 via ORAL
  Filled 2018-04-22 (×18): qty 1

## 2018-04-22 MED ORDER — HEPARIN SODIUM (PORCINE) 1000 UNIT/ML IJ SOLN
INTRAMUSCULAR | Status: AC
  Administered 2018-04-23: 5000 [IU] via INTRAVENOUS_CENTRAL
  Filled 2018-04-22: qty 5

## 2018-04-22 MED ORDER — DEXTROSE-NACL 5-0.45 % IV SOLN
INTRAVENOUS | Status: DC
  Administered 2018-04-23: 04:00:00 via INTRAVENOUS

## 2018-04-22 MED ORDER — INSULIN ASPART 100 UNIT/ML IV SOLN
10.0000 [IU] | Freq: Once | INTRAVENOUS | Status: AC
  Administered 2018-04-22: 10 [IU] via INTRAVENOUS

## 2018-04-22 MED ORDER — CINACALCET HCL 30 MG PO TABS
90.0000 mg | ORAL_TABLET | ORAL | Status: DC
  Administered 2018-04-24 – 2018-04-26 (×2): 90 mg via ORAL
  Filled 2018-04-22 (×3): qty 3

## 2018-04-22 MED ORDER — SODIUM CHLORIDE 0.9 % IV SOLN: 1.0000 g | Freq: Once | INTRAVENOUS | Status: DC

## 2018-04-22 MED ORDER — HEPARIN SODIUM (PORCINE) 5000 UNIT/ML IJ SOLN: 5000.0000 [IU] | Freq: Three times a day (TID) | INTRAMUSCULAR | Status: DC

## 2018-04-22 MED ORDER — NIFEDIPINE ER OSMOTIC RELEASE 30 MG PO TB24
30.0000 mg | ORAL_TABLET | Freq: Two times a day (BID) | ORAL | Status: DC
  Administered 2018-04-23 – 2018-04-27 (×10): 30 mg via ORAL
  Filled 2018-04-22 (×12): qty 1

## 2018-04-22 MED ORDER — CALCIUM GLUCONATE-NACL 1-0.675 GM/50ML-% IV SOLN
1.0000 g | Freq: Once | INTRAVENOUS | Status: AC
  Administered 2018-04-22: 1000 mg via INTRAVENOUS
  Filled 2018-04-22: qty 50

## 2018-04-22 MED ORDER — SODIUM CHLORIDE 0.9 % IV SOLN
INTRAVENOUS | Status: DC
  Administered 2018-04-22: 23:00:00 via INTRAVENOUS

## 2018-04-22 MED ORDER — FLUOXETINE HCL 20 MG PO CAPS
20.0000 mg | ORAL_CAPSULE | Freq: Every day | ORAL | Status: DC
  Administered 2018-04-23 – 2018-04-27 (×5): 20 mg via ORAL
  Filled 2018-04-22 (×5): qty 1

## 2018-04-22 MED ORDER — RENA-VITE PO TABS
1.0000 | ORAL_TABLET | Freq: Every day | ORAL | Status: DC
  Administered 2018-04-23 – 2018-04-27 (×5): 1 via ORAL
  Filled 2018-04-22 (×5): qty 1

## 2018-04-22 MED ORDER — SODIUM POLYSTYRENE SULFONATE 15 GM/60ML PO SUSP
30.0000 g | Freq: Once | ORAL | Status: AC
  Administered 2018-04-22: 30 g via ORAL
  Filled 2018-04-22: qty 120

## 2018-04-22 MED ORDER — INSULIN REGULAR(HUMAN) IN NACL 100-0.9 UT/100ML-% IV SOLN
INTRAVENOUS | Status: DC
  Administered 2018-04-22: 10.8 [IU]/h via INTRAVENOUS
  Administered 2018-04-23: 26 [IU]/h via INTRAVENOUS
  Filled 2018-04-22: qty 100

## 2018-04-22 MED ORDER — SODIUM BICARBONATE 8.4 % IV SOLN
100.0000 meq | Freq: Once | INTRAVENOUS | Status: AC
  Administered 2018-04-22: 100 meq via INTRAVENOUS
  Filled 2018-04-22: qty 50

## 2018-04-22 MED ORDER — LEVETIRACETAM 250 MG PO TABS
250.0000 mg | ORAL_TABLET | ORAL | Status: DC
  Administered 2018-04-24 – 2018-04-26 (×2): 250 mg via ORAL
  Filled 2018-04-22 (×4): qty 1

## 2018-04-22 MED ORDER — PANTOPRAZOLE SODIUM 40 MG IV SOLR: 40.0000 mg | Freq: Every day | INTRAVENOUS | Status: DC

## 2018-04-22 NOTE — H&P (Signed)
NAME:  Edward Colon, MRN:  [id number], DOB:  [birth year], LOS: 0 ADMISSION DATE:  04/22/2018,  REFERRING MD: Urgency department physician, CHIEF COMPLAINT: Hypercalcemia, hyperkalemia, hypertension in the setting of end-stage renal disease  Brief History   38 year old insulin-dependent diabetic noncompliant who presents with hyperglycemia, hyperkalemia and hypertension  History of present illness   38 year old male with type 1 diabetes has had multiple admissions due to noncompliance with hyperglycemia.  This [facility] 04/22/2018 with glucose greater than 1600 potassium greater than 7.5.  Does have a history of cardiac arrest in January 2020 for similar situations.  He is being transferred to [facility] to the medical intensive care unit for treatment of his hyperosmolar  nonketotic acidosis.  Glucose is greater than 1600 and pH of 7.12 venous blood. He has been started on insulin drip at [facility], is noted to be hypertensive with systolic pressures as great as 195.  He be transferred to [facility] continued on his insulin drip he will not be fluid resuscitation due to his end-stage renal disease.  Nephrology has been notified prior to his admission for urgent dialysis due to his hyperkalemia and his hyperglycemia. His psychotropic medications will be held at this time until his electrolytes have been normalized.  He should be able to undergo intermittent hemodialysis since his systolic pressure is elevated and he is not in shock.  He will be admitted to the pulmonary critical care service at this time until he stabilizes and moves out of the intensive care unit.  Past Medical History  End-stage renal disease Cardiac arrest 03/2018 Type 1 diabetes with noncompliance Depression Suicidal ideation Noncompliance Seizure disorder  Significant Hospital Events   Transferred to [facility] 04/22/2018 for glucose greater than 1600 and potassium  greater than 7.5  Consults:  Nephrology 04/22/2010  Procedures:    Significant Diagnostic Tests:    Micro Data:    Antimicrobials:    Interim history/subjective:  38 year old male who is end-stage renal disease insulin-dependent diabetic noncompliant with his insulin being transferred to [facility] with potassium greater than 7.5 glucose greater than 1600.  Objective   Blood pressure (!) 173/78, pulse (!) 102, temperature 98.3 F (36.8 C), temperature source Oral, resp. rate (!) 23, height 5\' 9"  (1.753 m), weight 95.3 kg, SpO2 96 %.       No intake or output data in the 24 hours ending 04/22/18 2053 Filed Weights   04/22/18 1830  Weight: 95.3 kg    Examination: General: Well-nourished well-developed male is awake and alert HENT: Pharynx with dry oral mucosa Lungs: Decreased breath sounds in the bases Cardiovascular: Heart sounds are regular regular rate and rhythm blood pressure is 189/95 Abdomen: Abdomen soft nontender Extremities: Warm dry 1+ edema Neuro: Answers questions moves all extremities does have the hiccups currently GU: Does not make urine  Resolved Hospital Problem list     Assessment & Plan:  Recurrent hyperosmolar nonketotic acidosis in the setting of noncompliance with medication.  Has not taken his insulin in over 7 days.  Glucose noted to be greater than 1600.  Initially admitted to [facility] 04/22/2018 he is end-stage renal disease being transferred to [facility] for dialysis with potassium greater than 7.  Note he had cardiac arrest January 2020 Insulin drip No fluid resuscitation due to end-stage renal disease He will need hemodialysis to correct his potassium Admit to the intensive care unit due to history of cardiac arrest in January  2020  End-stage renal disease Hyperkalemia Urgent hemodialysis on arrival to intensive care unit Nephrology has been notified of impending admission  Hypertension History of  ventricular tachycardia Suspect this will improve with urgent hemodialysis Telemetry monitoring in ICU setting. PRN Apresoline  History of depression History of seizures History of suicide attempt Rona Ravens voluntary commitment  Hold antipsychotics of Risperdal Prozac at this time Continue Keppra   Best practice:  Diet: N.p.o. Pain/Anxiety/Delirium protocol (if indicated): Not indicated VAP protocol (if indicated): None indicated DVT prophylaxis: Subcu heparin GI prophylaxis: P.o. Glucose control: Glucomander Mobility: Bedrest Code Status: Full Family Communication: No family at bedside Disposition: Insert care unit to 3M10-16 2020  Labs   CBC: Recent Labs  Lab 04/22/18 1815  WBC 12.4*  NEUTROABS 10.7*  HGB 10.3*  HCT 35.6*  MCV 96.5  PLT 229    Basic Metabolic Panel: Recent Labs  Lab 04/22/18 1815  NA 113*  K >7.5*  CL 70*  CO2 15*  GLUCOSE 1,649*  BUN 78*  CREATININE 11.29*  CALCIUM 7.9*   GFR: Estimated Creatinine Clearance: 10.4 mL/min (A) (by C-G formula based on SCr of 11.29 mg/dL (H)). Recent Labs  Lab 04/22/18 1815  WBC 12.4*    Liver Function Tests: No results for input(s): AST, ALT, ALKPHOS, BILITOT, PROT, ALBUMIN in the last 168 hours. No results for input(s): LIPASE, AMYLASE in the last 168 hours. No results for input(s): AMMONIA in the last 168 hours.  ABG    Component Value Date/Time   PHART 7.417 03/14/2018 1044   PCO2ART 50.8 (H) 03/14/2018 1044   PO2ART 348.0 (H) 03/14/2018 1044   HCO3 14.3 (L) 04/22/2018 1815   TCO2 28 03/14/2018 1100   ACIDBASEDEF 11.7 (H) 04/22/2018 1815   O2SAT 76.5 04/22/2018 1815     Coagulation Profile: No results for input(s): INR, PROTIME in the last 168 hours.  Cardiac Enzymes: No results for input(s): CKTOTAL, CKMB, CKMBINDEX, TROPONINI in the last 168 hours.  HbA1C: Hemoglobin A1C  Date/Time Value Ref Range Status  12/14/2016 10:37 AM 9.7  Final  04/11/2016 10:43 AM 9.7  Final   Hgb A1c  MFr Bld  Date/Time Value Ref Range Status  02/13/2018 03:24 AM 9.1 (H) 4.8 - 5.6 % Final    Comment:    (NOTE) Pre diabetes:          5.7%-6.4% Diabetes:              >6.4% Glycemic control for   <7.0% adults with diabetes   08/31/2014 04:42 AM 10.3 (H) 4.8 - 5.6 % Final    Comment:    (NOTE)         Pre-diabetes: 5.7 - 6.4         Diabetes: >6.4         Glycemic control for adults with diabetes: <7.0     CBG: Recent Labs  Lab 04/22/18 1751  GLUCAP >600*    Review of Systems:   10 point review of system taken, please see HPI for positives and negatives.   Past Medical History  He,  has a past medical history of Anemia, Blind left eye (since ~ 2010), Cardiac arrest ([place]), Depression, Diabetic peripheral neuropathy ([place]), ESRD (end stage renal disease) on dialysis ([place]), Heart murmur, Hypertension, Seizures ([place]), and Type 1 diabetes ([place]) (dx'd 1990).   Surgical History    Past Surgical History:  Procedure Laterality Date  . AV FISTULA PLACEMENT Right 03/03/2015   Procedure: RIGHT RADIO-CEPHALIC ARTERIOVENOUS (AV) FISTULA CREATION;  Surgeon: Serafina Mitchell, MD;  Location: [facility] OR;  Service: Vascular;  Laterality: Right;  . AV FISTULA PLACEMENT Left 06/15/2015   Procedure: INSERTION OF LEFT UPPER ARM  ARTERIOVENOUS (AV) 27mm x 50cm GORE-TEX GRAFT;  Surgeon: Elam Dutch, MD;  Location: [place];  Service: Vascular;  Laterality: Left;  . BASCILIC VEIN TRANSPOSITION Left 04/01/2015   Procedure: BASCILIC VEIN TRANSPOSITION-LEFT ARM- FIRST STAGE;  Surgeon: Elam Dutch, MD;  Location: [place];  Service: Vascular;  Laterality: Left;  . EYE SURGERY Right ~ 2010   for diabetic retinopathy  . INSERTION OF DIALYSIS CATHETER Right 03/03/2015   Procedure: INSERTION OF DIALYSIS CATHETER;  Surgeon: Serafina Mitchell, MD;  Location: [facility] OR;  Service: Vascular;  Laterality: Right;     Social History   reports that he has never smoked. He has never used smokeless tobacco. He reports that  he does not drink alcohol or use drugs.   Family History   His family history is negative for Diabetes.   Allergies No Known Allergies   Home Medications  Prior to Admission medications   Medication Sig Start Date End Date Taking? Authorizing Provider  calcium acetate (PHOSLO) 667 MG capsule Take 3 capsules (2,001 mg total) by mouth 3 (three) times daily with meals. 04/10/18  Yes [name], [name], MD  cinacalcet (SENSIPAR) 30 MG tablet Take 3 tablets (90 mg total) by mouth Every Tuesday,Thursday,and Saturday with dialysis. 04/10/18  Yes [name], [name], MD  FLUoxetine (PROZAC) 20 MG capsule Take 1 capsule (20 mg total) by mouth daily. 04/10/18  Yes [name], [name], MD  insulin glargine (LANTUS) 100 UNIT/ML injection Inject 0.08 mLs (8 Units total) into the skin 2 (two) times daily. Patient taking differently: Inject 14 Units into the skin every morning.  04/10/18  Yes [name], [name], MD  isosorbide-hydrALAZINE (BIDIL) 20-37.5 MG tablet Take 1 tablet by mouth 3 (three) times daily. 04/10/18  Yes [name], [name], MD  levETIRAcetam (KEPPRA) 250 MG tablet Take 1 tablet (250 mg total) by mouth every Tuesday, Thursday, and Saturday at 6 PM. 04/10/18  Yes [name], [name], MD  levETIRAcetam (KEPPRA) 500 MG tablet Take 1 tablet (500 mg total) by mouth daily at 6 (six) AM. 04/11/18  Yes [name], [name], MD  minoxidil (LONITEN) 2.5 MG tablet Take 2 tablets (5 mg total) by mouth daily at 6 PM. 04/10/18  Yes [name], [name], MD  multivitamin (RENA-VIT) TABS tablet Take 1 tablet by mouth at bedtime. 04/10/18  Yes [name], [name], MD  NIFEdipine (ADALAT CC) 30 MG 24 hr tablet Take 1 tablet (30 mg total) by mouth 2 (two) times daily. 04/10/18  Yes [name], [name], MD  risperiDONE (RISPERDAL) 1 MG tablet Take 1 tablet (1 mg total) by mouth 2 (two) times daily. 04/10/18  Yes [name], [name], MD  insulin aspart (NOVOLOG) 100 UNIT/ML injection Inject 0-9 Units into the skin 3 (three) times daily with meals for 30 days. For glucose 121  to 150 use one unit, for 151 to 200 use two units, for 201 to 250 use three units, for 251 to 300 use five units, for 301 to 350 use seven units, for 351 or greater use 9 units. Patient not taking: Reported on 04/22/2018 03/24/18 04/23/18  Arrien, Jimmy Picket, MD  insulin aspart (NOVOLOG) 100 UNIT/ML injection Inject 5 Units into the skin 3 (three) times daily with meals. Patient not taking: Reported on 04/22/2018 04/10/18   [name], [name], MD  Critical care time: 45 min    Richardson Landry Jemarcus Dougal ACNP Maryanna Shape PCCM Pager [phone] till 1 pm If no answer page [phone] 04/22/2018, 8:54 PM

## 2018-04-22 NOTE — Consult Note (Signed)
Renal Service Consult Note [facility]  Edward Colon 04/22/2018 [name] Requesting Physician:  Dr Dayna Barker, [facility] ED  Reason for Consult:  ESRD pt w/ DKA and severe hyperkalemia HPI: The patient is a 38 y.o. year-old with hx of DM1 age of onset was 9 yrs, ESRD on HD since 2016 gets HD on a TTS basis.  Also HTN, neuropathy, hx cardiac arrest, blind L eye, anemia , and depression.   Was here in Jan 2020 for hypoglycemia severe was in ICU on vent w generalized seizures treated w/ Keppra to f/u w/ neuro in OP setting and can prob dc Keppra if stable.  Started on prozac for depression and sent to IP psych. Was at IP psych from 1/22 to 04/11/18.   Pt presented to [facility] ED today , father had called EMS due to high blood sugar > 600. Last HD was Sat 04/21/18.  Reportedly pt has not had enough long-acting insulin for over a week and has been using just short-acting.  In ED the BS was 1600 and K+ 8.9. he was treated w/ IV insulin bolus then drip, IV bicarb/ Ca gluc, inhaled B agonist nebs. EKG initially showed marked QRS widening w/ NSR and peaked T waves. Pt has been transferred to [facility].  Telemetry here at [facility] looks much better with narrow QRS and slight tachycardia.  Repeat labs have not been drawn here yet.    ROS  denies CP  no joint pain   no HA  no blurry vision  no rash  no diarrhea  no nausea/ vomiting   Past Medical History  Past Medical History:  Diagnosis Date  . Anemia   . Blind left eye since ~ 2010  . Cardiac arrest ([place])   . Depression   . Diabetic peripheral neuropathy ([place])   . ESRD (end stage renal disease) on dialysis [facility])    "TTS; [place]; [facility]" (02/01/2016)  . Heart murmur    denies any problems with it  . Hypertension   . Seizures ([place])    "last one was end of 2016; they are related to my diabetes" (02/01/2016)  . Type 1 diabetes ([place]) dx'd 1990   Past Surgical History  Past Surgical History:  Procedure Laterality Date  . AV  FISTULA PLACEMENT Right 03/03/2015   Procedure: RIGHT RADIO-CEPHALIC ARTERIOVENOUS (AV) FISTULA CREATION;  Surgeon: Serafina Mitchell, MD;  Location: [facility] OR;  Service: Vascular;  Laterality: Right;  . AV FISTULA PLACEMENT Left 06/15/2015   Procedure: INSERTION OF LEFT UPPER ARM  ARTERIOVENOUS (AV) 35mm x 50cm GORE-TEX GRAFT;  Surgeon: Elam Dutch, MD;  Location: [place];  Service: Vascular;  Laterality: Left;  . BASCILIC VEIN TRANSPOSITION Left 04/01/2015   Procedure: BASCILIC VEIN TRANSPOSITION-LEFT ARM- FIRST STAGE;  Surgeon: Elam Dutch, MD;  Location: [place];  Service: Vascular;  Laterality: Left;  . EYE SURGERY Right ~ 2010   for diabetic retinopathy  . INSERTION OF DIALYSIS CATHETER Right 03/03/2015   Procedure: INSERTION OF DIALYSIS CATHETER;  Surgeon: Serafina Mitchell, MD;  Location: [facility] OR;  Service: Vascular;  Laterality: Right;   Family History  Family History  Problem Relation Age of Onset  . Diabetes Neg Hx    Social History  reports that he has never smoked. He has never used smokeless tobacco. He reports that he does not drink alcohol or use drugs. Allergies No Known Allergies Home medications Prior to Admission medications   Medication Sig Start Date End Date  Taking? Authorizing Provider  calcium acetate (PHOSLO) 667 MG capsule Take 3 capsules (2,001 mg total) by mouth 3 (three) times daily with meals. 04/10/18  Yes [name], [name], MD  cinacalcet (SENSIPAR) 30 MG tablet Take 3 tablets (90 mg total) by mouth Every Tuesday,Thursday,and Saturday with dialysis. 04/10/18  Yes [name], [name], MD  FLUoxetine (PROZAC) 20 MG capsule Take 1 capsule (20 mg total) by mouth daily. 04/10/18  Yes [name], [name], MD  insulin glargine (LANTUS) 100 UNIT/ML injection Inject 0.08 mLs (8 Units total) into the skin 2 (two) times daily. Patient taking differently: Inject 14 Units into the skin every morning.  04/10/18  Yes [name], [name], MD  isosorbide-hydrALAZINE (BIDIL) 20-37.5 MG tablet Take 1  tablet by mouth 3 (three) times daily. 04/10/18  Yes [name], [name], MD  levETIRAcetam (KEPPRA) 250 MG tablet Take 1 tablet (250 mg total) by mouth every Tuesday, Thursday, and Saturday at 6 PM. 04/10/18  Yes [name], [name], MD  levETIRAcetam (KEPPRA) 500 MG tablet Take 1 tablet (500 mg total) by mouth daily at 6 (six) AM. 04/11/18  Yes [name], [name], MD  minoxidil (LONITEN) 2.5 MG tablet Take 2 tablets (5 mg total) by mouth daily at 6 PM. 04/10/18  Yes [name], [name], MD  multivitamin (RENA-VIT) TABS tablet Take 1 tablet by mouth at bedtime. 04/10/18  Yes [name], [name], MD  NIFEdipine (ADALAT CC) 30 MG 24 hr tablet Take 1 tablet (30 mg total) by mouth 2 (two) times daily. 04/10/18  Yes [name], [name], MD  risperiDONE (RISPERDAL) 1 MG tablet Take 1 tablet (1 mg total) by mouth 2 (two) times daily. 04/10/18  Yes [name], [name], MD  insulin aspart (NOVOLOG) 100 UNIT/ML injection Inject 0-9 Units into the skin 3 (three) times daily with meals for 30 days. For glucose 121 to 150 use one unit, for 151 to 200 use two units, for 201 to 250 use three units, for 251 to 300 use five units, for 301 to 350 use seven units, for 351 or greater use 9 units. Patient not taking: Reported on 04/22/2018 03/24/18 04/23/18  Arrien, Jimmy Picket, MD  insulin aspart (NOVOLOG) 100 UNIT/ML injection Inject 5 Units into the skin 3 (three) times daily with meals. Patient not taking: Reported on 04/22/2018 04/10/18   [name], [name], MD   Liver Function Tests No results for input(s): AST, ALT, ALKPHOS, BILITOT, PROT, ALBUMIN in the last 168 hours. No results for input(s): LIPASE, AMYLASE in the last 168 hours. CBC Recent Labs  Lab 04/22/18 1815  WBC 12.4*  NEUTROABS 10.7*  HGB 10.3*  HCT 35.6*  MCV 96.5  PLT 408   Basic Metabolic Panel Recent Labs  Lab 04/22/18 1815  NA 113*  K >7.5*  CL 70*  CO2 15*  GLUCOSE 1,649*  BUN 78*  CREATININE 11.29*  CALCIUM 7.9*   Iron/TIBC/Ferritin/ %Sat    Component Value  Date/Time   IRON 23 (L) 03/04/2015 0411   TIBC 192 (L) 03/04/2015 0411   FERRITIN 117 03/04/2015 0411   IRONPCTSAT 12 (L) 03/04/2015 0411    Vitals:   04/22/18 1830 04/22/18 1831 04/22/18 2030 04/22/18 2146  BP:   (!) 173/78 (!) 185/66  Pulse:   (!) 102 (!) 106  Resp:   (!) 23 20  Temp:   98.3 F (36.8 C)   TempSrc:   Oral   SpO2:  97% 96% 95%  Weight: 95.3 kg     Height: 5'  9" (1.753 m)      Exam Gen alert and in no distress, calm and pleasant, Ox 3 No rash, cyanosis or gangrene Sclera anicteric, throat clear No jvd or bruits Chest clear bilat to bases RRR no MRG Abd soft ntnd no mass or ascites +bs GU normal male MS no joint effusions or deformity Ext trace LE edema, no wounds or ulcers Neuro is alert, Ox 3 , nf LUA AVG +bruit, no signs of infection    Home meds:  - nifedipine 30 bid/isosorbide-hydralazine 20-37.5 tid  - risperidone 1mg  bid/ fluoxetine 20 qd  - levetiracetam 500 qd and 250 mg after each HD  - cinacalcet 90 TTS w hd   Dialysis: TTS SW  4h 69min  88.5kg  2/2.25 bath  500/800  LUA AVG  Hep 5000  - mircera 60 every 2 wks  - venofer 50 /wk  - cinacalcet 90 mg po tiw   Assessment: 1. Severe hyperkalemia - EKG at [facility] ED looked very bad, but now after IV insulin and all other ^K+ meds the QRS looks normal.  Will plan HD in ICU asap tonight and will repeat K+ getting started.   2. DKA/ T1DM - ran out of long acting insulin 3. ESRD on HD: has not missed any HD, last HD 2/15 yesterday 4. Vol excess - up 4-6 kg by wts, UF half tonight 5. HTN - resume usual BP meds  6. Seizures - on last admit, taking Keppra po    P: 1. As above.  Will follow.        [facility] 04/22/2018, 10:02 PM

## 2018-04-22 NOTE — ED Notes (Signed)
Spoke with Jenny Reichmann on [place] who advised pts CBG was greater than 600. This nurse entered value into glucose stabilizer and advised Edward Colon to change rate to 16.2 units/hr.

## 2018-04-22 NOTE — ED Provider Notes (Signed)
Patient seen and assessed in any pain emergency department.  He is admitted to the ICU. Heart rate 108 blood pressure 185/66 patient is awake and alert Dr. Jonnie Finner is at bedside patient is to be transferred to ICU   Pattricia Boss, MD 04/22/18 2158

## 2018-04-22 NOTE — ED Notes (Signed)
Report given to [facility]  

## 2018-04-22 NOTE — H&P (Addendum)
History and Physical    Vinayak Bobier ZJQ:[id number] DOB: [birth year] DOA: 04/22/2018  I have briefly reviewed the patient's prior medical records in [place]  PCP: Nolene Ebbs, MD  Patient coming from: home  Chief Complaint: Abdominal pain, nausea and vomiting  HPI: Edward Colon is a 38 y.o. male with medical history significant of insulin-dependent diabetes mellitus, end-stage renal disease on hemodialysis, history of nonadherence with prior episodes of DKA as well as severe hyperkalemia in January 2019 resulting in cardiac arrest, hypertension, seizure disorder, presents to the hospital with chief complaint of running out of his insulin for the past 7 days, as well as epigastric pain nausea and vomiting.  He also states that he has had a URI type illness about couple of days ago which is now improved.  He denies any fever or chills, he denies any chest pain, he denies any shortness of breath.  He reports compliance with his home medications except insulin that he ran out of, and also reports compliance with dialysis, last HD was yesterday on Saturday as per his normal schedule.  ED Course: In the ED patient is afebrile, was found to be tachycardic with a heart rate up into the 130s, hypertensive with blood pressure in the 190s, satting well on room air.  His blood work is remarkable for a venous pH of 7.12, BMP showed a sodium of 113, potassium was undetectably high greater than 7.5, glucose was 1649.  BUN is 78 and creatinine is 11.29.  He has an anion gap of 28.  His CBC shows slight leukocytosis with a white count of 12.4.  He was placed on IV insulin, given calcium, given Kayexalate.  Nephrology was consulted and recommend transfer from [facility] ED to [place] for dialysis.  We are asked to admit  Review of Systems: As per HPI otherwise 10 point review of systems negative.   Past Medical History:  Diagnosis Date  . Anemia   . Blind left eye since ~  2010  . Cardiac arrest ([place])   . Depression   . Diabetic peripheral neuropathy ([place])   . ESRD (end stage renal disease) on dialysis [facility])    "TTS; [place] [facility]; [facility]" (02/01/2016)  . Heart murmur    denies any problems with it  . Hypertension   . Seizures ([place])    "last one was end of 2016; they are related to my diabetes" (02/01/2016)  . Type 1 diabetes ([place]) dx'd 1990    Past Surgical History:  Procedure Laterality Date  . AV FISTULA PLACEMENT Right 03/03/2015   Procedure: RIGHT RADIO-CEPHALIC ARTERIOVENOUS (AV) FISTULA CREATION;  Surgeon: Serafina Mitchell, MD;  [facility];  Service: Vascular;  Laterality: Right;  . AV FISTULA PLACEMENT Left 06/15/2015   Procedure: INSERTION OF LEFT UPPER ARM  ARTERIOVENOUS (AV) 76mm x 50cm GORE-TEX GRAFT;  Surgeon: Elam Dutch, MD;  Location: [place];  Service: Vascular;  Laterality: Left;  . BASCILIC VEIN TRANSPOSITION Left 04/01/2015   Procedure: BASCILIC VEIN TRANSPOSITION-LEFT ARM- FIRST STAGE;  Surgeon: Elam Dutch, MD;  Location: [place];  Service: Vascular;  Laterality: Left;  . EYE SURGERY Right ~ 2010   for diabetic retinopathy  . INSERTION OF DIALYSIS CATHETER Right 03/03/2015   Procedure: INSERTION OF DIALYSIS CATHETER;  Surgeon: Serafina Mitchell, MD;  Location: [place];  Service: Vascular;  Laterality: Right;     reports that he has never smoked. He has never used smokeless tobacco. He reports  that he does not drink alcohol or use drugs.  No Known Allergies  Family History  Problem Relation Age of Onset  . Diabetes Neg Hx     Prior to Admission medications   Medication Sig Start Date End Date Taking? Authorizing Provider  calcium acetate (PHOSLO) 667 MG capsule Take 3 capsules (2,001 mg total) by mouth 3 (three) times daily with meals. 04/10/18   [name], [name], MD  cinacalcet (SENSIPAR) 30 MG tablet Take 3 tablets (90 mg total) by mouth Every Tuesday,Thursday,and Saturday with dialysis. 04/10/18   [name], [name], MD    FLUoxetine (PROZAC) 20 MG capsule Take 1 capsule (20 mg total) by mouth daily. 04/10/18   [name], [name], MD  insulin aspart (NOVOLOG) 100 UNIT/ML injection Inject 0-9 Units into the skin 3 (three) times daily with meals for 30 days. For glucose 121 to 150 use one unit, for 151 to 200 use two units, for 201 to 250 use three units, for 251 to 300 use five units, for 301 to 350 use seven units, for 351 or greater use 9 units. 03/24/18 04/23/18  Arrien, Jimmy Picket, MD  insulin aspart (NOVOLOG) 100 UNIT/ML injection Inject 5 Units into the skin 3 (three) times daily with meals. 04/10/18   [name], [name], MD  insulin glargine (LANTUS) 100 UNIT/ML injection Inject 0.08 mLs (8 Units total) into the skin 2 (two) times daily. 04/10/18   [name], [name], MD  isosorbide-hydrALAZINE (BIDIL) 20-37.5 MG tablet Take 1 tablet by mouth 3 (three) times daily. 04/10/18   [name], [name], MD  levETIRAcetam (KEPPRA) 250 MG tablet Take 1 tablet (250 mg total) by mouth every Tuesday, Thursday, and Saturday at 6 PM. 04/10/18   [name], [name], MD  levETIRAcetam (KEPPRA) 500 MG tablet Take 1 tablet (500 mg total) by mouth daily at 6 (six) AM. 04/11/18   [name], [name], MD  minoxidil (LONITEN) 2.5 MG tablet Take 2 tablets (5 mg total) by mouth daily at 6 PM. 04/10/18   [name], [name], MD  multivitamin (RENA-VIT) TABS tablet Take 1 tablet by mouth at bedtime. 04/10/18   [name], [name], MD  NIFEdipine (ADALAT CC) 30 MG 24 hr tablet Take 1 tablet (30 mg total) by mouth 2 (two) times daily. 04/10/18   [name], [name], MD  risperiDONE (RISPERDAL) 1 MG tablet Take 1 tablet (1 mg total) by mouth 2 (two) times daily. 04/10/18   [name], [name], MD    Physical Exam: Vitals:   04/22/18 1826 04/22/18 1827 04/22/18 1830 04/22/18 1831  BP:  (!) 195/80    Pulse:  68    Resp:  16    Temp:      TempSrc:      SpO2: 97%   97%  Weight:   95.3 kg   Height:   5\' 9"  (1.753 m)     Constitutional: Slightly uncomfortable but no significant  distress Eyes: PERRL, lids and conjunctivae normal ENMT: Mucous membranes are moist.  Neck: normal, supple Respiratory: clear to auscultation bilaterally, no wheezing, no crackles. Normal respiratory effort. No accessory muscle use.  Cardiovascular: Regular rate and rhythm, 3/6 SEM, tachycardic, no peripheral edema, good pulses  Abdomen: no tenderness, no masses palpated. Bowel sounds positive.  Musculoskeletal: no clubbing / cyanosis. Normal muscle tone.  Skin: no rashes, lesions, ulcers. No induration Neurologic: CN 2-12 grossly intact. Strength 5/5 in all 4.  Psychiatric: Normal judgment and insight. Alert and oriented x 3. Normal mood.  Labs on Admission: I have personally reviewed following labs and imaging studies  CBC: Recent Labs  Lab 04/22/18 1815  WBC 12.4*  NEUTROABS 10.7*  HGB 10.3*  HCT 35.6*  MCV 96.5  PLT 161   Basic Metabolic Panel: Recent Labs  Lab 04/22/18 1815  NA 113*  K >7.5*  CL 70*  CO2 15*  GLUCOSE 1,649*  BUN 78*  CREATININE 11.29*  CALCIUM 7.9*   GFR: Estimated Creatinine Clearance: 10.4 mL/min (A) (by C-G formula based on SCr of 11.29 mg/dL (H)). Liver Function Tests: No results for input(s): AST, ALT, ALKPHOS, BILITOT, PROT, ALBUMIN in the last 168 hours. No results for input(s): LIPASE, AMYLASE in the last 168 hours. No results for input(s): AMMONIA in the last 168 hours. Coagulation Profile: No results for input(s): INR, PROTIME in the last 168 hours. Cardiac Enzymes: No results for input(s): CKTOTAL, CKMB, CKMBINDEX, TROPONINI in the last 168 hours. BNP (last 3 results) No results for input(s): PROBNP in the last 8760 hours. HbA1C: No results for input(s): HGBA1C in the last 72 hours. CBG: Recent Labs  Lab 04/22/18 1751  GLUCAP >600*   Lipid Profile: No results for input(s): CHOL, HDL, LDLCALC, TRIG, CHOLHDL, LDLDIRECT in the last 72 hours. Thyroid Function Tests: No results for input(s): TSH, T4TOTAL, FREET4, T3FREE,  THYROIDAB in the last 72 hours. Anemia Panel: No results for input(s): VITAMINB12, FOLATE, FERRITIN, TIBC, IRON, RETICCTPCT in the last 72 hours. Urine analysis:    Component Value Date/Time   COLORURINE YELLOW 03/14/2018 1135   APPEARANCEUR CLEAR 03/14/2018 1135   LABSPEC 1.014 03/14/2018 1135   PHURINE 8.0 03/14/2018 1135   GLUCOSEU >=500 (A) 03/14/2018 1135   HGBUR NEGATIVE 03/14/2018 1135   BILIRUBINUR NEGATIVE 03/14/2018 1135   KETONESUR NEGATIVE 03/14/2018 1135   PROTEINUR >=300 (A) 03/14/2018 1135   UROBILINOGEN 0.2 10/26/2013 2324   NITRITE NEGATIVE 03/14/2018 1135   LEUKOCYTESUR NEGATIVE 03/14/2018 1135     Radiological Exams on Admission: No results found.  EKG: Independently reviewed. Sinus rhythm, wide QRS, peaked elevated T waves  Assessment/Plan Active Problems:   Hyperkalemia   Principal Problem DKA, type I diabetes, hyperglycemia -Admit patient to stepdown, he was placed on insulin infusion, IV fluids.  Check BMPs every 4 hours -Continue n.p.o., allow ice chips  Active Problems Severe hyperkalemia with EKG changes -Transfer patient to [facility] for hemod[facility], EDP discussed with Dr. Jonnie Finner with nephrology -Was given albuterol, calcium, Kayexalate in the ED.  Monitor on telemetry -Repeat BMP is as above -reports compliance with dialysis  Hypertension -Resume home medications, blood pressure elevated in the 190s, improved into the 170s on my evaluation  History of seizure disorder -Continue Keppra  Pseudohyponatremia -Sodium normalizes once to correct for glucose   DVT prophylaxis: heparin  Code Status: Full code  Family Communication: parents present at bedside  Disposition Plan: admit to [facility] Consults called: nephrology, Dr. Jonnie Finner   Addendum: Per EDP Dr. Dolly Rias, SDU will not accept patient at [facility] due to CBG of 1600. He discussed with carelink, will go to ICU.  Marzetta Board, MD, PhD [facility]  Contact  via [email or web address]  [facility] Office Info P: [phone]  F: [phone]   04/22/2018, 7:14 PM

## 2018-04-22 NOTE — ED Notes (Signed)
Care link transport patient

## 2018-04-22 NOTE — ED Provider Notes (Signed)
Emergency Department Provider Note   I have reviewed the triage vital signs and the nursing notes.   HISTORY  Chief Complaint Hyperglycemia   HPI Edward Colon is a 38 y.o. male with multiple medical problems documented below the presents the emergency department today with hyperglycemia.  Patient is lethargic and is unable to offer any clinical information.  His father states that he left the hospital proximate 10 days's ago and has not taken any long-acting insulin since that time but has been taking short acting insulin.  They cannot get any insulin today and the patient was acting altered so they brought him here for further evaluation.  Level 5 caveat secondary to altered mental status.   Past Medical History:  Diagnosis Date  . Anemia   . Blind left eye since ~ 2010  . Cardiac arrest ([place])   . Depression   . Diabetic peripheral neuropathy ([place])   . ESRD (end stage renal disease) on dialysis [facility])    "TTS; [facility]; [facility]" (02/01/2016)  . Heart murmur    denies any problems with it  . Hypertension   . Seizures ([place])    "last one was end of 2016; they are related to my diabetes" (02/01/2016)  . Type 1 diabetes ([place]) dx'd 1990    Patient Active Problem List   Diagnosis Date Noted  . MDD (major depressive disorder), recurrent severe, without psychosis ([place]) 03/28/2018  . Major depressive disorder, single episode, severe ([place]) 03/25/2018  . Diabetic neuropathy ([place])   . Blindness of left eye with normal vision in contralateral eye   . Agitation   . Encephalopathy   . Hypertensive emergency 02/15/2018  . Encephalopathy, hypertensive 02/15/2018  . Redness of left eye 02/15/2018  . Altered mental status 02/08/2018  . Cardiac arrest ([place])   . DKA (diabetic ketoacidoses) ([place]) 03/05/2017  . Ventricular tachycardia ([place]) 03/05/2017  . Acute respiratory failure ([place])   . Involuntary commitment 01/29/2016  . Suicide attempt by substance overdose ([place])  01/28/2016  . ESRD on dialysis ([place]) 03/07/2015  . Hyperglycemia 03/06/2015  . Acute on chronic renal failure ([place]) 02/27/2015  . URI (upper respiratory infection) 02/27/2015  . Acute-on-chronic kidney injury ([place]) 02/27/2015  . Rhabdomyolysis 08/31/2014  . Hypoglycemia 08/30/2014  . Type 1 diabetes ([place]) 08/30/2014  . CKD stage 2 due to type 2 diabetes mellitus ([place]) 08/30/2014  . Anemia 08/30/2014  . Swelling of arm 05/31/2013  . ARF (acute renal failure) ([place]) 05/31/2013  . Hyperkalemia 05/31/2013  . Seizure ([place]) 05/31/2013  . Seizures ([place])   . Hypertension     Past Surgical History:  Procedure Laterality Date  . AV FISTULA PLACEMENT Right 03/03/2015   Procedure: RIGHT RADIO-CEPHALIC ARTERIOVENOUS (AV) FISTULA CREATION;  Surgeon: Serafina Mitchell, MD;  [facility];  Service: Vascular;  Laterality: Right;  . AV FISTULA PLACEMENT Left 06/15/2015   Procedure: INSERTION OF LEFT UPPER ARM  ARTERIOVENOUS (AV) 32mm x 50cm GORE-TEX GRAFT;  Surgeon: Elam Dutch, MD;  Location: [place];  Service: Vascular;  Laterality: Left;  . BASCILIC VEIN TRANSPOSITION Left 04/01/2015   Procedure: BASCILIC VEIN TRANSPOSITION-LEFT ARM- FIRST STAGE;  Surgeon: Elam Dutch, MD;  Location: [place];  Service: Vascular;  Laterality: Left;  . EYE SURGERY Right ~ 2010   for diabetic retinopathy  . INSERTION OF DIALYSIS CATHETER Right 03/03/2015   Procedure: INSERTION OF DIALYSIS CATHETER;  Surgeon: Serafina Mitchell, MD;  Location: [place];  Service: Vascular;  Laterality: Right;  Current Outpatient Rx  . Order #: [id number] Class: Print  . Order #: [id number] Class: Print  . Order #: [id number] Class: Print  . Order #: [id number] Class: Normal  . Order #: [id number] Class: Print  . Order #: [id number] Class: Print  . Order #: [id number] Class: Print  . Order #: [id number] Class: Print  . Order #: [id number] Class: Print  . Order #: [id number] Class: Print  . Order #: [id number] Class: Print  . Order #:  [id number] Class: Print  . Order #: [id number] Class: Print    Allergies Patient has no known allergies.  Family History  Problem Relation Age of Onset  . Diabetes Neg Hx     Social History Social History   Tobacco Use  . Smoking status: Never Smoker  . Smokeless tobacco: Never Used  Substance Use Topics  . Alcohol use: No    Frequency: Never    Comment: 02/01/2016 "might have 1 drink/year"  . Drug use: No    Review of Systems  Level 5 caveat secondary to altered mental status. ____________________________________________   PHYSICAL EXAM:  VITAL SIGNS: ED Triage Vitals  Enc Vitals Group     BP 04/22/18 1815 (!) 176/59     Pulse Rate 04/22/18 1815 88     Resp 04/22/18 1815 16     Temp 04/22/18 1815 98.3 F (36.8 C)     Temp Source 04/22/18 1815 Oral     SpO2 04/22/18 1815 97 %     Weight 04/22/18 1830 210 lb (95.3 kg)     Height 04/22/18 1830 5\' 9"  (1.753 m)    Constitutional: answers questions slowly and not fully. Lethargic.  Eyes: Conjunctivae are normal.  Head: Atraumatic. Nose: No congestion/rhinnorhea. Mouth/Throat: Mucous membranes are dry.  Oropharynx non-erythematous. Neck: No stridor.  No meningeal signs.   Cardiovascular: tachycardic rate, irregular rhythm. Good peripheral circulation. Grossly normal heart sounds.   Respiratory: tachypneic respiratory effort.  No retractions. Lungs CTAB. Gastrointestinal: Soft and nontender. No distention.  Musculoskeletal: No lower extremity tenderness nor edema. No gross deformities of extremities. Neurologic:  Lethargic. Not able to fully assess.  Skin:  Skin is warm, dry and intact. No rash noted.   ____________________________________________   LABS (all labs ordered are listed, but only abnormal results are displayed)  Labs Reviewed  BLOOD GAS, VENOUS - Abnormal; Notable for the following components:      Result Value   pH, Ven 7.127 (*)    pO2, Ven 52.3 (*)    Bicarbonate 14.3 (*)    Acid-base  deficit 11.7 (*)    All other components within normal limits  CBC WITH DIFFERENTIAL/PLATELET - Abnormal; Notable for the following components:   WBC 12.4 (*)    RBC 3.69 (*)    Hemoglobin 10.3 (*)    HCT 35.6 (*)    MCHC 28.9 (*)    Neutro Abs 10.7 (*)    All other components within normal limits  CBG MONITORING, ED - Abnormal; Notable for the following components:   Glucose-Capillary >600 (*)    All other components within normal limits  BASIC METABOLIC PANEL  URINALYSIS, ROUTINE W REFLEX MICROSCOPIC   ____________________________________________  EKG   EKG Interpretation  Date/Time:  Sunday April 22 2018 17:57:03 EST Ventricular Rate:  79 PR Interval:    QRS Duration: 204 QT Interval:  408 QTC Calculation: 468 R Axis:   155 Text Interpretation:  Sinus rhythm Atrial premature complex Right atrial enlargement RBBB and LPFB Baseline wander in lead(s) V6 hyperacute T waves - peaked QRS widened concern for  hyperkalemia / ischemia findings new since 10 days ago last EKG Confirmed by Noemi Chapel [phone]) on 04/22/2018 6:22:46 PM       ____________________________________________  RADIOLOGY  No results found.  ____________________________________________   PROCEDURES  Procedure(s) performed:   Procedures  Angiocath insertion Performed by: Merrily Pew  Consent: Verbal consent obtained. Risks and benefits: risks, benefits and alternatives were discussed Time out: Immediately prior to procedure a "time out" was called to verify the correct patient, procedure, equipment, support staff and site/side marked as required.  Preparation: Patient was prepped and draped in the usual sterile fashion.  Vein Location: right basiclic  Ultrasound Guided  Gauge: 20  Normal blood return and flush without difficulty Patient tolerance: Patient tolerated the procedure well with no immediate complications.   CRITICAL CARE Performed by: Merrily Pew Total critical care  time: 65 minutes Critical care time was exclusive of separately billable procedures and treating other patients. Critical care was necessary to treat or prevent imminent or life-threatening deterioration. Critical care was time spent personally by me on the following activities: development of treatment plan with patient and/or surrogate as well as nursing, discussions with consultants, evaluation of patient's response to treatment, examination of patient, obtaining history from patient or surrogate, ordering and performing treatments and interventions, ordering and review of laboratory studies, ordering and review of radiographic studies, pulse oximetry and re-evaluation of patient's condition.  ____________________________________________   INITIAL IMPRESSION / ASSESSMENT AND PLAN / ED COURSE  Suspect patient is likely hyperkalemic secondary to the significantly peaked T waves, absent of T waves and wide QRS.  Also with his hyperglycemia suspect he is probably in DKA since his mental status is diminished and he has tachypnea.  Will start calcium gluconate, bicarb, albuterol, insulin drip and discuss with nephrology.   Clinical Course as of Apr 24 19  Sun Apr 22, 2018  1831 Discussed clinical situation and ECG with nephrology, Dr. Jonnie Finner, Pending K. Will treat for hyperkalemia, if above 7, will need transfer to [facility] for [facility], if less than can probably stay here. Asked to be repaged when K returns.    [JM]  1847 Talked with lab directly. K is 8.9, Na 113, glucose not calculated, needs diluted.  Paged Dr. Jonnie Finner again. Will page hospitalist.    [JM]  2018 Was told by [facility] at [facility] that [facility] at [facility] refused to allow patient to be admitted to stepdown because his blood sugar was too high after myself and hospitalist both evaluated patient and thought stepdown was appropriate.  Called ICU physician around [phone], Dr. Dana Allan, who accepted to [facility] ICU. Bed request placed shortly after.   Significant delay in bed assignment for unknown reason.  I called bed placement/careling at 2017 and discussed with [facility] who stated that even though a bed had been assigned >20 minutes ago it had not been approved? At this time, mental status is improving. ECG is improving. Suspect K is temporized at this point and thus won't pursue ED to ED transfer. Will continue to monitor.    [JM]    Clinical Course User Index [JM] Djuna Frechette, Corene Cornea, MD    Pertinent labs & imaging results that were available during my care of the patient were reviewed by me and considered in my medical decision making (see chart for details).  ____________________________________________  FINAL CLINICAL IMPRESSION(S) / ED DIAGNOSES  Final diagnoses:  Hyperkalemia  Diabetic ketoacidosis without coma associated with other specified diabetes mellitus ([place])     MEDICATIONS GIVEN  DURING THIS VISIT:  Medications  sodium chloride 0.9 % bolus 1,000 mL (1,000 mLs Intravenous New Bag/Given 04/22/18 1810)    And  0.9 %  sodium chloride infusion (has no administration in time range)  sodium polystyrene (KAYEXALATE) 15 GM/60ML suspension 30 g (has no administration in time range)  albuterol (PROVENTIL,VENTOLIN) solution continuous neb (10 mg/hr Nebulization New Bag/Given 04/22/18 1831)  calcium gluconate 1 g/ 50 mL sodium chloride IVPB (1,000 mg Intravenous New Bag/Given 04/22/18 1816)  dextrose 5 %-0.45 % sodium chloride infusion (has no administration in time range)  insulin regular, human (MYXREDLIN) 100 units/ 100 mL infusion (has no administration in time range)  ondansetron (ZOFRAN) injection 4 mg (4 mg Intravenous Given 04/22/18 1819)  insulin aspart (novoLOG) injection 10 Units (10 Units Intravenous Given 04/22/18 1822)  sodium bicarbonate injection 100 mEq (100 mEq Intravenous Given 04/22/18 1818)  insulin aspart (novoLOG) 100 UNIT/ML injection (10 Units  Given 04/22/18 1825)     NEW OUTPATIENT MEDICATIONS STARTED  DURING THIS VISIT:  New Prescriptions   No medications on file    Note:  This note was prepared with assistance of Dragon voice recognition software. Occasional wrong-word or sound-a-like substitutions may have occurred due to the inherent limitations of voice recognition software.   Cricket Goodlin, Corene Cornea, MD 04/23/18 Laureen Abrahams

## 2018-04-22 NOTE — Plan of Care (Signed)
ATTENDING NOTE: I have personally reviewed patient's available data, including medical history, events of note, physical examination and test results as part of my evaluation. I have discussed with resident/NP and other careteam providers such as pharmacist, RN and RRT & co-ordinated with consultants. In addition, I personally evaluated patient and elicited key history/exam/labs/imaging showing:  38 year old male with type 1 diabetes severe noncompliance, diabetic retinopathy, end-stage renal disease on hemodialysis, prior cariac arrest due to hyperkalemia who presented to [facility] with complaints of generalized weakness, has been out of long-acting insulin for the last week.  There is noted to be severely acidotic and started on insulin drip and bolused normal saline.  He was transferred to [facility] for dialysis.  Here he is unable to give me much pertinent history, he just says that he was having "decreased functionality" and repeats his answers multiple times.  He states that there are "problems with affordability "with obtaining his insulin however he cannot elaborate more than this.  He denies any other associated symptoms besides some weakness.  He is oriented to place though not time.  Exam otherwise nonfocal.  Awake, alert, and although nontoxic-appearing.  Dry mucous membranes.  Labs notable for combined hypercapnic and anion gap metabolic acidosis with severe hyperkalemia and associated EKG changes which have improved after temporizing measures.  Hyperglycemic however urine ketones negative so we will check lactic acid and serum beta hydroxybutyrate to differentiate because of acidosis as well as DKA versus hyperosmolar nonketotic state.  Will treat with insulin drip though hold on IV fluids as he is received resuscitation, is hemodynamically stable, and requires dialysis.    The rest of the note per NP/medical resident whose note is outlined above and that I agree with and  edited in full.   Mali Cederic Mozley, MD [facility] Pulmonology/Critical Care Pager [phone]

## 2018-04-23 DIAGNOSIS — I161 Hypertensive emergency: Secondary | ICD-10-CM

## 2018-04-23 DIAGNOSIS — E0810 Diabetes mellitus due to underlying condition with ketoacidosis without coma: Secondary | ICD-10-CM

## 2018-04-23 LAB — BLOOD GAS, ARTERIAL
Acid-Base Excess: 5.3 mmol/L — ABNORMAL HIGH (ref 0.0–2.0)
Acid-base deficit: 6.5 mmol/L — ABNORMAL HIGH (ref 0.0–2.0)
BICARBONATE: 19.2 mmol/L — AB (ref 20.0–28.0)
Bicarbonate: 29.5 mmol/L — ABNORMAL HIGH (ref 20.0–28.0)
DRAWN BY: 414221
Drawn by: 517021
FIO2: 21
FIO2: 21
O2 Saturation: 93.7 %
O2 Saturation: 96.3 %
Patient temperature: 98.1
Patient temperature: 98.6
pCO2 arterial: 42.9 mmHg (ref 32.0–48.0)
pCO2 arterial: 45.5 mmHg (ref 32.0–48.0)
pH, Arterial: 7.271 — ABNORMAL LOW (ref 7.350–7.450)
pH, Arterial: 7.428 (ref 7.350–7.450)
pO2, Arterial: 74.8 mmHg — ABNORMAL LOW (ref 83.0–108.0)
pO2, Arterial: 77.4 mmHg — ABNORMAL LOW (ref 83.0–108.0)

## 2018-04-23 LAB — BASIC METABOLIC PANEL
Anion gap: 21 — ABNORMAL HIGH (ref 5–15)
Anion gap: 16 — ABNORMAL HIGH (ref 5–15)
Anion gap: 18 — ABNORMAL HIGH (ref 5–15)
BUN: 81 mg/dL — ABNORMAL HIGH (ref 6–20)
BUN: 28 mg/dL — ABNORMAL HIGH (ref 6–20)
BUN: 29 mg/dL — ABNORMAL HIGH (ref 6–20)
CHLORIDE: 79 mmol/L — AB (ref 98–111)
CO2: 20 mmol/L — AB (ref 22–32)
CO2: 24 mmol/L (ref 22–32)
CO2: 25 mmol/L (ref 22–32)
Calcium: 8.1 mg/dL — ABNORMAL LOW (ref 8.9–10.3)
Calcium: 8.3 mg/dL — ABNORMAL LOW (ref 8.9–10.3)
Calcium: 8.1 mg/dL — ABNORMAL LOW (ref 8.9–10.3)
Chloride: 100 mmol/L (ref 98–111)
Chloride: 97 mmol/L — ABNORMAL LOW (ref 98–111)
Creatinine, Ser: 12.02 mg/dL — ABNORMAL HIGH (ref 0.61–1.24)
Creatinine, Ser: 6.21 mg/dL — ABNORMAL HIGH (ref 0.61–1.24)
Creatinine, Ser: 6.95 mg/dL — ABNORMAL HIGH (ref 0.61–1.24)
GFR calc Af Amer: 6 mL/min — ABNORMAL LOW (ref >60)
GFR calc Af Amer: 12 mL/min — ABNORMAL LOW (ref >60)
GFR calc Af Amer: 11 mL/min — ABNORMAL LOW (ref >60)
GFR calc non Af Amer: 5 mL/min — ABNORMAL LOW (ref >60)
GFR calc non Af Amer: 11 mL/min — ABNORMAL LOW (ref >60)
GFR calc non Af Amer: 9 mL/min — ABNORMAL LOW (ref >60)
Glucose, Bld: 1419 mg/dL (ref 70–99)
Glucose, Bld: 70 mg/dL (ref 70–99)
Glucose, Bld: 108 mg/dL — ABNORMAL HIGH (ref 70–99)
POTASSIUM: 3.7 mmol/L (ref 3.5–5.1)
Potassium: 4 mmol/L (ref 3.5–5.1)
Potassium: 3.3 mmol/L — ABNORMAL LOW (ref 3.5–5.1)
SODIUM: 140 mmol/L (ref 135–145)
Sodium: 120 mmol/L — ABNORMAL LOW (ref 135–145)
Sodium: 140 mmol/L (ref 135–145)

## 2018-04-23 LAB — LIPASE, BLOOD: Lipase: 124 U/L — ABNORMAL HIGH (ref 11–51)

## 2018-04-23 LAB — COMPREHENSIVE METABOLIC PANEL
ALT: 28 U/L (ref 0–44)
AST: 34 U/L (ref 15–41)
Albumin: 3.6 g/dL (ref 3.5–5.0)
Alkaline Phosphatase: 67 U/L (ref 38–126)
Anion gap: 29 — ABNORMAL HIGH (ref 5–15)
BUN: 80 mg/dL — ABNORMAL HIGH (ref 6–20)
CHLORIDE: 72 mmol/L — AB (ref 98–111)
CO2: 17 mmol/L — ABNORMAL LOW (ref 22–32)
Calcium: 8 mg/dL — ABNORMAL LOW (ref 8.9–10.3)
Creatinine, Ser: 11.8 mg/dL — ABNORMAL HIGH (ref 0.61–1.24)
GFR calc Af Amer: 6 mL/min — ABNORMAL LOW (ref >60)
GFR, EST NON AFRICAN AMERICAN: 5 mL/min — AB (ref >60)
Glucose, Bld: 1577 mg/dL (ref 70–99)
Potassium: 4.8 mmol/L (ref 3.5–5.1)
Sodium: 118 mmol/L — CL (ref 135–145)
Total Bilirubin: 1 mg/dL (ref 0.3–1.2)
Total Protein: 7.2 g/dL (ref 6.5–8.1)

## 2018-04-23 LAB — CBC
HCT: 25.2 % — ABNORMAL LOW (ref 39.0–52.0)
Hemoglobin: 8.8 g/dL — ABNORMAL LOW (ref 13.0–17.0)
MCH: 28.1 pg (ref 26.0–34.0)
MCHC: 34.9 g/dL (ref 30.0–36.0)
MCV: 80.5 fL (ref 80.0–100.0)
Platelets: 248 10*3/uL (ref 150–400)
RBC: 3.13 MIL/uL — ABNORMAL LOW (ref 4.22–5.81)
RDW: 13.3 % (ref 11.5–15.5)
WBC: 10.4 10*3/uL (ref 4.0–10.5)
nRBC: 0 % (ref 0.0–0.2)

## 2018-04-23 LAB — GLUCOSE, CAPILLARY
GLUCOSE-CAPILLARY: 296 mg/dL — AB (ref 70–99)
GLUCOSE-CAPILLARY: 180 mg/dL — AB (ref 70–99)
GLUCOSE-CAPILLARY: 83 mg/dL (ref 70–99)
GLUCOSE-CAPILLARY: 157 mg/dL — AB (ref 70–99)
Glucose-Capillary: 103 mg/dL — ABNORMAL HIGH (ref 70–99)
Glucose-Capillary: 111 mg/dL — ABNORMAL HIGH (ref 70–99)
Glucose-Capillary: 120 mg/dL — ABNORMAL HIGH (ref 70–99)
Glucose-Capillary: 122 mg/dL — ABNORMAL HIGH (ref 70–99)
Glucose-Capillary: 195 mg/dL — ABNORMAL HIGH (ref 70–99)
Glucose-Capillary: 199 mg/dL — ABNORMAL HIGH (ref 70–99)
Glucose-Capillary: 213 mg/dL — ABNORMAL HIGH (ref 70–99)
Glucose-Capillary: 214 mg/dL — ABNORMAL HIGH (ref 70–99)
Glucose-Capillary: 335 mg/dL — ABNORMAL HIGH (ref 70–99)
Glucose-Capillary: 34 mg/dL — CL (ref 70–99)
Glucose-Capillary: 35 mg/dL — CL (ref 70–99)
Glucose-Capillary: 53 mg/dL — ABNORMAL LOW (ref 70–99)
Glucose-Capillary: 580 mg/dL (ref 70–99)
Glucose-Capillary: 67 mg/dL — ABNORMAL LOW (ref 70–99)
Glucose-Capillary: 72 mg/dL (ref 70–99)
Glucose-Capillary: 78 mg/dL (ref 70–99)
Glucose-Capillary: 97 mg/dL (ref 70–99)
Glucose-Capillary: 99 mg/dL (ref 70–99)
Glucose-Capillary: >600 mg/dL (ref 70–99)

## 2018-04-23 LAB — CORTISOL: Cortisol, Plasma: 18.4 ug/dL

## 2018-04-23 LAB — MRSA PCR SCREENING: MRSA by PCR: NEGATIVE

## 2018-04-23 LAB — MAGNESIUM: MAGNESIUM: 2.3 mg/dL (ref 1.7–2.4)

## 2018-04-23 LAB — BETA-HYDROXYBUTYRIC ACID: Beta-Hydroxybutyric Acid: 1.15 mmol/L — ABNORMAL HIGH (ref 0.05–0.27)

## 2018-04-23 LAB — LACTIC ACID, PLASMA
LACTIC ACID, VENOUS: 1.8 mmol/L (ref 0.5–1.9)
Lactic Acid, Venous: 1 mmol/L (ref 0.5–1.9)

## 2018-04-23 LAB — PHOSPHORUS: Phosphorus: 7.3 mg/dL — ABNORMAL HIGH (ref 2.5–4.6)

## 2018-04-23 LAB — PROCALCITONIN: Procalcitonin: 5.35 ng/mL

## 2018-04-23 MED ORDER — LIVING WELL WITH DIABETES BOOK
Freq: Once | Status: AC
  Administered 2018-04-23: 18:00:00
  Filled 2018-04-23: qty 1

## 2018-04-23 MED ORDER — INSULIN DETEMIR 100 UNIT/ML ~~LOC~~ SOLN
5.0000 [IU] | Freq: Two times a day (BID) | SUBCUTANEOUS | Status: DC
  Administered 2018-04-23 (×2): 5 [IU] via SUBCUTANEOUS
  Filled 2018-04-23 (×4): qty 0.05

## 2018-04-23 MED ORDER — DEXTROSE 50 % IV SOLN: 25.0000 mL | Freq: Once | INTRAVENOUS | Status: DC

## 2018-04-23 MED ORDER — LORAZEPAM 2 MG/ML IJ SOLN
1.0000 mg | Freq: Once | INTRAMUSCULAR | Status: AC
  Administered 2018-04-23: 1 mg via INTRAVENOUS
  Filled 2018-04-23: qty 1

## 2018-04-23 MED ORDER — DEXTROSE 5 % IV SOLN
INTRAVENOUS | Status: DC
  Administered 2018-04-23: 11:00:00 via INTRAVENOUS

## 2018-04-23 MED ORDER — INSULIN ASPART 100 UNIT/ML ~~LOC~~ SOLN
1.0000 [IU] | SUBCUTANEOUS | Status: DC
  Administered 2018-04-23 (×2): 2 [IU] via SUBCUTANEOUS

## 2018-04-23 MED ORDER — SODIUM CHLORIDE 0.9 % IV SOLN
100.0000 mL | INTRAVENOUS | Status: DC | PRN
  Administered 2018-04-23 (×2): 200 mL via INTRAVENOUS

## 2018-04-23 MED ORDER — CHLORHEXIDINE GLUCONATE CLOTH 2 % EX PADS: 6.0000 | MEDICATED_PAD | Freq: Every day | CUTANEOUS | Status: DC

## 2018-04-23 MED ORDER — DEXTROSE 50 % IV SOLN
INTRAVENOUS | Status: AC
  Administered 2018-04-23: 26 mL
  Filled 2018-04-23: qty 50

## 2018-04-23 MED ORDER — DEXTROSE 50 % IV SOLN
25.0000 mL | Freq: Once | INTRAVENOUS | Status: AC
  Administered 2018-04-23: 25 mL via INTRAVENOUS

## 2018-04-23 MED ORDER — DEXTROSE 50 % IV SOLN
INTRAVENOUS | Status: AC
  Administered 2018-04-23: 25 mL
  Filled 2018-04-23: qty 50

## 2018-04-23 NOTE — Progress Notes (Signed)
HD tx initiated via 15Gx2 w/o problem Pull/push/flush well w/o problem VSS Will continue to monitor while on HD tx 

## 2018-04-23 NOTE — Progress Notes (Signed)
HD tx completed _0  w/bp issues throughout most of tx, UF was off 2 hrs of the tx, pt became more confused/restless/agitated and unable to f/c as tx went on. Pt kept trying to get OOB, kept flailing side to side causing machine to continually alarm, pt also had increased bleeding time of both stick sites.  UF goal not met Blood rinsed back VSS at end of tx Report given to Woodroe Chen, RN

## 2018-04-23 NOTE — Progress Notes (Signed)
Hypoglycemic Event  CBG: 34  Treatment: D50 25 mL (12.5 gm)  Symptoms: Shaky  Follow-up CBG: [id number] CBG Result:78  Possible Reasons for Event: Unknown  Instructed to restart insulin drip at .4 per protocol on glucostabilizer, did not restart will continue to monitor    [employer]

## 2018-04-23 NOTE — Progress Notes (Signed)
Pt became very restless during HD trying to get out of the bed, pt remains oriented, Elink notified of change, medication was ordered pt resting comfortably. RN continue to monitor

## 2018-04-23 NOTE — Progress Notes (Signed)
[facility] Progress Note  Subjective: no c/o  Vitals:   04/23/18 0500 04/23/18 0600 04/23/18 0804 04/23/18 1126  BP: 105/84 132/71  (!) 161/85  Pulse: 96 94    Resp: 17 17    Temp:   98.4 F (36.9 C)   TempSrc:   Oral   SpO2: 98% 98%    Weight:      Height:        Inpatient medications: . Chlorhexidine Gluconate Cloth  6 each Topical Q0600  . [START ON 04/24/2018] cinacalcet  90 mg Oral Q T,Th,Sa-HD  . dextrose  25 mL Intravenous Once  . FLUoxetine  20 mg Oral Daily  . heparin  5,000 Units Subcutaneous Q8H  . isosorbide-hydrALAZINE  1 tablet Oral TID  . [START ON 04/24/2018] levETIRAcetam  250 mg Oral Q T,Th,Sat-1800  . levETIRAcetam  500 mg Oral Q0600  . multivitamin  1 tablet Oral QHS  . NIFEdipine  30 mg Oral BID  . pantoprazole (PROTONIX) IV  40 mg Intravenous QHS  . risperiDONE  1 mg Oral BID   . albuterol Stopped (04/22/18 2134)  . dextrose 50 mL/hr at 04/23/18 1125  . insulin Stopped (04/23/18 0552)   heparin  Iron/TIBC/Ferritin/ %Sat    Component Value Date/Time   IRON 23 (L) 03/04/2015 0411   TIBC 192 (L) 03/04/2015 0411   FERRITIN 117 03/04/2015 0411   IRONPCTSAT 12 (L) 03/04/2015 0411    Exam: Gen alert and in no distress, withdrawn today No jvd or bruits Chest clear bilat to bases RRR no MRG Abd soft ntnd no mass or ascites +bs Ext trace LE edema Neuro is alert, Ox 3 , nf LUA AVG +bruit    Home meds:  - nifedipine 30 bid/isosorbide-hydralazine 20-37.5 tid  - risperidone 1mg  bid/ fluoxetine 20 qd  - levetiracetam 500 qd and 250 mg after each HD  - cinacalcet 90 TTS w hd   Dialysis: TTS SW  4h 51min  88.5kg  2/2.25 bath  500/800  LUA AVG  Hep 5000  - mircera 60 every 2 wks  - venofer 50 /wk  - cinacalcet 90 mg po tiw   Assessment: 1. Severe hyperkalemia - resolved w/ IV insulin and HD.  2. DKA/ T1DM - ran out of long acting insulin. Per primary, AG 16 this am 3. ESRD on HD: had HD last night. HD tomorrow.   4. Vol excess - resolved, at dry wt today 5. HTN - resume usual BP meds  6. Seizures - on last admit, taking Keppra po        [facility] 04/23/2018, 1:09 PM  Recent Labs  Lab 04/22/18 2306 04/23/18 0033 04/23/18 0736  NA 118* 120* 140  K 4.8 4.0 3.7  CL 72* 79* 100  CO2 17* 20* 24  GLUCOSE 1,577* 1,419* 70  BUN 80* 81* 28*  CREATININE 11.80* 12.02* 6.21*  CALCIUM 8.0* 8.1* 8.3*  PHOS 7.3*  --   --   ALBUMIN 3.6  --   --   INR 1.21  --   --    Recent Labs  Lab 04/22/18 2306  AST 34  ALT 28  ALKPHOS 67  BILITOT 1.0  PROT 7.2   Recent Labs  Lab 04/22/18 1815 04/22/18 2306  WBC 12.4* 10.2  NEUTROABS 10.7*  --   HGB 10.3* 8.7*  HCT 35.6* 28.2*  MCV 96.5 89.0  PLT 296 269

## 2018-04-23 NOTE — Progress Notes (Addendum)
Inpatient Diabetes Program Recommendations  AACE/ADA: New Consensus Statement on Inpatient Glycemic Control (2015)  Target Ranges:  Prepandial:   less than 140 mg/dL      Peak postprandial:   less than 180 mg/dL (1-2 hours)      Critically ill patients:  140 - 180 mg/dL   Lab Results  Component Value Date   GLUCAP 103 (H) 04/23/2018   HGBA1C 9.1 (H) 02/13/2018    Review of Glycemic Control Results for Edward Colon, Edward Colon (MRN [id number]) as of 04/23/2018 14:48  Ref. Range 04/23/2018 09:36 04/23/2018 10:42 04/23/2018 11:08 04/23/2018 12:35 04/23/2018 13:55  Glucose-Capillary Latest Ref Range: 70 - 99 mg/dL 83 67 (L) 122 (H) 97 103 (H)   Diabetes history: DM1 Outpatient Diabetes medications: Lantus 14 units daily + Novolog 5 units tid meal coverage + Novolog 0-9 units tid correction Current orders for Inpatient glycemic control: Levemir 5 units bid + Novolog 1-3 units q 4 hrs.  Inpatient Diabetes Program Recommendations:   When patient's eating improves,  -Novolog 3 units tid meal coverage -Change Novolog correction to tid with meals and hs Patient reviewed by DM Coordinator on 04/10/18.  Spoke with patient @ bedside. Patient shared that he was not able to take Novolog due to not covered with his Medicare or Medicaid. On Discharge: -Needs prescriptions for Lantus & Humalog -Needs prescription for glucometer & strips (order #[id number])  Thank you, Nani Gasser. Hiran Leard, RN, MSN, CDE  Diabetes Coordinator Inpatient Glycemic Control Team Team Pager [phone] (8am-5pm) 04/23/2018 2:50 PM

## 2018-04-23 NOTE — Significant Event (Signed)
Pts CBG 67 @1042 . 48ml D50 given, recheck 122.  Relayed to both CCM MD and Dr. Jonnie Finner.  Further relayed that pts CBGs have been low all night despite insulin gtt being off and pt has received a total of 2 amps of D50 since 5AM. Pts anion gap not closed. Received verbal order from Dr. Jonnie Finner to change D5 1/2NS to D5W at same rate of 80ml and continue to monitor closely.

## 2018-04-23 NOTE — Progress Notes (Signed)
[facility] Progress Note Patient Name: Edward Colon DOB: 1980/10/25 MRN: [id number]   Date of Service  04/23/2018  HPI/Events of Note  Patient off of insulin IV infusion and eating PO diet.   eICU Interventions  Will order: 1. D/C Q 1 hour POC blood glucose.  2. D/C D5W IV infusion.      Intervention Category Major Interventions: Hyperglycemia - active titration of insulin therapy  Lysle Dingwall 04/23/2018, 10:34 PM

## 2018-04-23 NOTE — Significant Event (Signed)
Pts CBG this AM 50 61ml D50 given Recheck 15 min later: CBG: 99  Insulin gtt remains off. Will continue to monitor closely and update CCM

## 2018-04-23 NOTE — Progress Notes (Signed)
pts K:3.3, CCM NP notified

## 2018-04-23 NOTE — Progress Notes (Signed)
Hypoglycemic Event  CBG: 35  Treatment: D50 25 mL (12.5 gm)  Symptoms: None  Follow-up CBG: Time:0721 CBG Result:72  Possible Reasons for Event: Unknown    [place]

## 2018-04-23 NOTE — Progress Notes (Addendum)
NAME:  Edward Colon, MRN:  [id number], DOB:  1980/04/07, LOS: 1 ADMISSION DATE:  04/22/2018,  REFERRING MD: Urgency department physician, CHIEF COMPLAINT: Hypercalcemia, hyperkalemia, hypertension in the setting of end-stage renal disease  Brief History   38 year old ESRD, insulin-dependent brittle diabetic noncompliant who presents with hyperglycemia > 1600, hyperkalemia and hypertension, s/p iHD 2/17 am   History of present illness   37 year old male with type 1 diabetes has had multiple admissions due to noncompliance with hyperglycemia.  This [facility] 04/22/2018 with glucose greater than 1600 potassium greater than 7.5.  Does have a history of cardiac arrest in January 2020 for similar situations.  He is being transferred to [facility] to the medical intensive care unit for treatment of his hyperosmolar  nonketotic acidosis.  Glucose is greater than 1600 and pH of 7.12 venous blood. He has been started on insulin drip at [facility], is noted to be hypertensive with systolic pressures as great as 195.  He be transferred to [facility] continued on his insulin drip he will not be fluid resuscitation due to his end-stage renal disease.  Nephrology has been notified prior to his admission for urgent dialysis due to his hyperkalemia and his hyperglycemia. His psychotropic medications will be held at this time until his electrolytes have been normalized.  He should be able to undergo intermittent hemodialysis since his systolic pressure is elevated and he is not in shock.  He will be admitted to the pulmonary critical care service at this time until he stabilizes and moves out of the intensive care unit.  Past Medical History  End-stage renal disease Cardiac arrest 03/2018 Type 1 diabetes with noncompliance Depression Suicidal ideation Noncompliance Seizure disorder  Significant Hospital Events   Transferred to [facility] 04/22/2018 for  glucose greater than 1600 and potassium greater than 7.5  Consults:  Nephrology 04/22/2010  Procedures:  2/17 iHD  Significant Diagnostic Tests:    Micro Data:  2/16 MRSA PCR >> neg  Antimicrobials:    Interim history/subjective:  iHD this am.  Since has been more hypoglycemia and lethargic.  Off insulin gtt, now on D5 gtt at 50 ml/ hr.  Post HD labs show  glucose 1419- 70, AG not closed therefore remains NPO Only c/o of being hungry  Objective   Blood pressure (!) 161/85, pulse 94, temperature 98.4 F (36.9 C), temperature source Oral, resp. rate 17, height 5\' 9"  (1.753 m), weight 88.3 kg, SpO2 98 %.        Intake/Output Summary (Last 24 hours) at 04/23/2018 1219 Last data filed at 04/23/2018 0600 Gross per 24 hour  Intake 318.54 ml  Output 923 ml  Net -604.46 ml   Filed Weights   04/22/18 1830 04/23/18 0015 04/23/18 0443  Weight: 95.3 kg 89.1 kg 88.3 kg    Examination: General:  Adult male lying in bed in NAD HEENT: MM dry, anicteric  Neuro: soft spoken, flat affect, wakes up to verbal, oriented, MAE CV:  rrr, no m/r/g PULM: even/non-labored,  lungs bilaterally clear KG:SUPJ, non-tender, bs active  Extremities: warm/dry, no edema  Skin: no rashes    Resolved Hospital Problem list     Assessment & Plan:  Recurrent hyperosmolar nonketotic acidosis in the setting of noncompliance with medication.  Has not taken his insulin in over 7 days.  Glucose noted to be greater than 1600.  Initially admitted to [facility] 04/22/2018 he is end-stage renal disease being transferred to [facility] for dialysis with potassium greater than 7.  Note he had cardiac arrest January 2020 secondary to hyperkalemia  Continue D5 at 50 ml/hr - off insulin gtt since ~3am due to ongoing hypoglycemia  Closely monitoring CBG given brittle diabetes/ hypoglycemia episodes Start renal/ DM diet  Diabetes coordinator referral  Will start SSI  End-stage renal  disease Hyperkalemia Hyponatremia -pseudo, corrects to 141 S/p iHD 2/17 am, pending repeat BMP No neurological changes thus far, continue to monitor given severe shifts in glucose  Hypertension History of ventricular tachycardia Tele monitoring  PRN Apresoline IV Restarting home HTN  minoxidil and isosorbide/ hydralazine   History of depression History of seizures History of suicide attempt Prior voluntary commitment  Continue Keppra, prozac, risperdal  Psych consult, recent admit for depression.  Several recent admissions for life threatening issues and medical non-compliance.   Best practice:  Diet: renal / DM diet  Pain/Anxiety/Delirium protocol (if indicated): Not indicated VAP protocol (if indicated): None indicated DVT prophylaxis: Subcu heparin GI prophylaxis: po Glucose control: Glucomander Mobility: Bedrest Code Status: Full Family Communication: No family at bedside 2/17 Disposition: ICU till blood sugar stabilizes   Labs   CBC: Recent Labs  Lab 04/22/18 1815 04/22/18 2306  WBC 12.4* 10.2  NEUTROABS 10.7*  --   HGB 10.3* 8.7*  HCT 35.6* 28.2*  MCV 96.5 89.0  PLT 296 476    Basic Metabolic Panel: Recent Labs  Lab 04/22/18 1815 04/22/18 2306 04/23/18 0033 04/23/18 0736  NA 113* 118* 120* 140  K >7.5* 4.8 4.0 3.7  CL 70* 72* 79* 100  CO2 15* 17* 20* 24  GLUCOSE 1,649* 1,577* 1,419* 70  BUN 78* 80* 81* 28*  CREATININE 11.29* 11.80* 12.02* 6.21*  CALCIUM 7.9* 8.0* 8.1* 8.3*  MG  --  2.3  --   --   PHOS  --  7.3*  --   --    GFR: Estimated Creatinine Clearance: 18.2 mL/min (A) (by C-G formula based on SCr of 6.21 mg/dL (H)). Recent Labs  Lab 04/22/18 1815 04/22/18 2306 04/23/18 0736  PROCALCITON  --  5.35  --   WBC 12.4* 10.2  --   LATICACIDVEN  --  10.0* 1.8    Liver Function Tests: Recent Labs  Lab 04/22/18 2306  AST 34  ALT 28  ALKPHOS 67  BILITOT 1.0  PROT 7.2  ALBUMIN 3.6   Recent Labs  Lab 04/22/18 2306  LIPASE 124*    No results for input(s): AMMONIA in the last 168 hours.  ABG    Component Value Date/Time   PHART 7.428 04/23/2018 0500   PCO2ART 45.5 04/23/2018 0500   PO2ART 77.4 (L) 04/23/2018 0500   HCO3 29.5 (H) 04/23/2018 0500   TCO2 28 03/14/2018 1100   ACIDBASEDEF 6.5 (H) 04/23/2018 0010   O2SAT 96.3 04/23/2018 0500     Coagulation Profile: Recent Labs  Lab 04/22/18 2306  INR 1.21    Cardiac Enzymes: No results for input(s): CKTOTAL, CKMB, CKMBINDEX, TROPONINI in the last 168 hours.  HbA1C: Hemoglobin A1C  Date/Time Value Ref Range Status  12/14/2016 10:37 AM 9.7  Final  04/11/2016 10:43 AM 9.7  Final   Hgb A1c MFr Bld  Date/Time Value Ref Range Status  02/13/2018 03:24 AM 9.1 (H) 4.8 - 5.6 % Final    Comment:    (NOTE) Pre diabetes:          5.7%-6.4% Diabetes:              >6.4%  Glycemic control for   <7.0% adults with diabetes   08/31/2014 04:42 AM 10.3 (H) 4.8 - 5.6 % Final    Comment:    (NOTE)         Pre-diabetes: 5.7 - 6.4         Diabetes: >6.4         Glycemic control for adults with diabetes: <7.0     CBG: Recent Labs  Lab 04/23/18 0803 04/23/18 0825 04/23/18 0936 04/23/18 1042 04/23/18 1108  GLUCAP 53* 99 83 67* 122*    Critical care time: 92 min    Kennieth Rad, MSN, AGACNP-BC [facility] Pgr: [phone] or if no answer [phone] 04/23/2018, 1:06 PM

## 2018-04-23 NOTE — Progress Notes (Signed)
eLink Physician-Brief Progress Note Patient Name: Edward Colon DOB: 08-30-1980 MRN: [id number]   Date of Service  04/23/2018  HPI/Events of Note  A 38 year old male with a history of end-stage renal disease on hemodialysis was admitted to the ICU because of requirement of emergency dialysis and has a diabetic ketoacidosis.   eICU Interventions  Potassium on admission was 8.9 with EKG changes.  Arranged for emergency dialysis.  Diabetic ketoacidosis secondary to noncompliance with insulin.  Started on insulin drip and then recommended to initiate the DKA protocol.  Notified nurse practitioner Richardson Landry.     Intervention Category Major Interventions: Acid-Base disturbance - evaluation and management;Electrolyte abnormality - evaluation and management Evaluation Type: New Patient Evaluation  Mady Gemma 04/23/2018, 12:05 AM

## 2018-04-24 DIAGNOSIS — F329 Major depressive disorder, single episode, unspecified: Secondary | ICD-10-CM

## 2018-04-24 DIAGNOSIS — F32A Depression, unspecified: Secondary | ICD-10-CM

## 2018-04-24 LAB — GLUCOSE, CAPILLARY
GLUCOSE-CAPILLARY: 113 mg/dL — AB (ref 70–99)
GLUCOSE-CAPILLARY: 383 mg/dL — AB (ref 70–99)
GLUCOSE-CAPILLARY: 416 mg/dL — AB (ref 70–99)
Glucose-Capillary: 17 mg/dL — CL (ref 70–99)
Glucose-Capillary: 179 mg/dL — ABNORMAL HIGH (ref 70–99)
Glucose-Capillary: 194 mg/dL — ABNORMAL HIGH (ref 70–99)
Glucose-Capillary: 20 mg/dL — CL (ref 70–99)
Glucose-Capillary: 217 mg/dL — ABNORMAL HIGH (ref 70–99)
Glucose-Capillary: 235 mg/dL — ABNORMAL HIGH (ref 70–99)
Glucose-Capillary: 311 mg/dL — ABNORMAL HIGH (ref 70–99)
Glucose-Capillary: 371 mg/dL — ABNORMAL HIGH (ref 70–99)
Glucose-Capillary: 404 mg/dL — ABNORMAL HIGH (ref 70–99)

## 2018-04-24 LAB — CBC
HCT: 27.2 % — ABNORMAL LOW (ref 39.0–52.0)
Hemoglobin: 9 g/dL — ABNORMAL LOW (ref 13.0–17.0)
MCH: 27.8 pg (ref 26.0–34.0)
MCHC: 33.1 g/dL (ref 30.0–36.0)
MCV: 84 fL (ref 80.0–100.0)
Platelets: 244 10*3/uL (ref 150–400)
RBC: 3.24 MIL/uL — ABNORMAL LOW (ref 4.22–5.81)
RDW: 13.8 % (ref 11.5–15.5)
WBC: 5.9 10*3/uL (ref 4.0–10.5)
nRBC: 0 % (ref 0.0–0.2)

## 2018-04-24 LAB — BASIC METABOLIC PANEL
Anion gap: 15 (ref 5–15)
BUN: 32 mg/dL — ABNORMAL HIGH (ref 6–20)
CHLORIDE: 91 mmol/L — AB (ref 98–111)
CO2: 27 mmol/L (ref 22–32)
Calcium: 8.1 mg/dL — ABNORMAL LOW (ref 8.9–10.3)
Creatinine, Ser: 8.29 mg/dL — ABNORMAL HIGH (ref 0.61–1.24)
GFR calc Af Amer: 9 mL/min — ABNORMAL LOW (ref >60)
GFR calc non Af Amer: 8 mL/min — ABNORMAL LOW (ref >60)
Glucose, Bld: 332 mg/dL — ABNORMAL HIGH (ref 70–99)
POTASSIUM: 4 mmol/L (ref 3.5–5.1)
Sodium: 133 mmol/L — ABNORMAL LOW (ref 135–145)

## 2018-04-24 LAB — RENAL FUNCTION PANEL
ANION GAP: 17 — AB (ref 5–15)
Albumin: 3.2 g/dL — ABNORMAL LOW (ref 3.5–5.0)
BUN: 37 mg/dL — ABNORMAL HIGH (ref 6–20)
CALCIUM: 8.2 mg/dL — AB (ref 8.9–10.3)
CO2: 24 mmol/L (ref 22–32)
Chloride: 94 mmol/L — ABNORMAL LOW (ref 98–111)
Creatinine, Ser: 9.57 mg/dL — ABNORMAL HIGH (ref 0.61–1.24)
GFR calc Af Amer: 7 mL/min — ABNORMAL LOW (ref >60)
GFR calc non Af Amer: 6 mL/min — ABNORMAL LOW (ref >60)
Glucose, Bld: 320 mg/dL — ABNORMAL HIGH (ref 70–99)
Phosphorus: 7 mg/dL — ABNORMAL HIGH (ref 2.5–4.6)
Potassium: 4.5 mmol/L (ref 3.5–5.1)
Sodium: 135 mmol/L (ref 135–145)

## 2018-04-24 MED ORDER — INSULIN ASPART 100 UNIT/ML ~~LOC~~ SOLN
0.0000 [IU] | Freq: Every day | SUBCUTANEOUS | Status: DC
  Administered 2018-04-25 – 2018-04-26 (×2): 4 [IU] via SUBCUTANEOUS

## 2018-04-24 MED ORDER — DEXTROSE 50 % IV SOLN
1.0000 | INTRAVENOUS | Status: DC | PRN
  Filled 2018-04-24: qty 50

## 2018-04-24 MED ORDER — HEPARIN SODIUM (PORCINE) 1000 UNIT/ML DIALYSIS: 5000.0000 [IU] | Freq: Once | INTRAMUSCULAR | Status: DC

## 2018-04-24 MED ORDER — POLYETHYLENE GLYCOL 3350 17 G PO PACK
17.0000 g | PACK | Freq: Every day | ORAL | Status: DC
  Administered 2018-04-24 – 2018-04-26 (×3): 17 g via ORAL
  Filled 2018-04-24 (×4): qty 1

## 2018-04-24 MED ORDER — DEXTROSE 50 % IV SOLN
INTRAVENOUS | Status: AC
  Administered 2018-04-24: 50 mL
  Filled 2018-04-24: qty 50

## 2018-04-24 MED ORDER — INSULIN ASPART 100 UNIT/ML ~~LOC~~ SOLN
7.0000 [IU] | Freq: Once | SUBCUTANEOUS | Status: AC
  Administered 2018-04-24: 7 [IU] via SUBCUTANEOUS

## 2018-04-24 MED ORDER — INSULIN ASPART 100 UNIT/ML ~~LOC~~ SOLN
0.0000 [IU] | SUBCUTANEOUS | Status: DC
  Administered 2018-04-24 (×2): 11 [IU] via SUBCUTANEOUS

## 2018-04-24 MED ORDER — SENNOSIDES-DOCUSATE SODIUM 8.6-50 MG PO TABS
1.0000 | ORAL_TABLET | Freq: Two times a day (BID) | ORAL | Status: DC
  Administered 2018-04-24 – 2018-04-27 (×7): 1 via ORAL
  Filled 2018-04-24 (×8): qty 1

## 2018-04-24 MED ORDER — CALCIUM ACETATE (PHOS BINDER) 667 MG PO CAPS
2001.0000 mg | ORAL_CAPSULE | Freq: Three times a day (TID) | ORAL | Status: DC
  Administered 2018-04-24 – 2018-04-27 (×9): 2001 mg via ORAL
  Filled 2018-04-24 (×8): qty 3

## 2018-04-24 MED ORDER — DOCUSATE SODIUM 283 MG RE ENEM
1.0000 | ENEMA | Freq: Every day | RECTAL | Status: DC | PRN
  Filled 2018-04-24: qty 1

## 2018-04-24 MED ORDER — INSULIN DETEMIR 100 UNIT/ML ~~LOC~~ SOLN
5.0000 [IU] | Freq: Two times a day (BID) | SUBCUTANEOUS | Status: DC
  Administered 2018-04-24 – 2018-04-25 (×3): 5 [IU] via SUBCUTANEOUS
  Filled 2018-04-24 (×4): qty 0.05

## 2018-04-24 MED ORDER — INSULIN ASPART 100 UNIT/ML ~~LOC~~ SOLN
0.0000 [IU] | Freq: Three times a day (TID) | SUBCUTANEOUS | Status: DC
  Administered 2018-04-24: 3 [IU] via SUBCUTANEOUS
  Administered 2018-04-24: 9 [IU] via SUBCUTANEOUS
  Administered 2018-04-25: 1 [IU] via SUBCUTANEOUS
  Administered 2018-04-25 – 2018-04-26 (×2): 9 [IU] via SUBCUTANEOUS
  Administered 2018-04-26: 5 [IU] via SUBCUTANEOUS
  Administered 2018-04-27: 9 [IU] via SUBCUTANEOUS
  Administered 2018-04-27: 7 [IU] via SUBCUTANEOUS

## 2018-04-24 MED ORDER — HEPARIN SODIUM (PORCINE) 1000 UNIT/ML IJ SOLN
INTRAMUSCULAR | Status: AC
  Administered 2018-04-24: 5000 [IU] via INTRAVENOUS_CENTRAL
  Filled 2018-04-24: qty 5

## 2018-04-24 MED ORDER — MINOXIDIL 2.5 MG PO TABS
5.0000 mg | ORAL_TABLET | Freq: Every day | ORAL | Status: DC
  Administered 2018-04-24 – 2018-04-25 (×2): 5 mg via ORAL
  Filled 2018-04-24 (×3): qty 2

## 2018-04-24 NOTE — Care Management Note (Signed)
Case Management Note  Patient Details  Name: Edward Colon MRN: [id number] Date of Birth: November 09, 1980  Subjective/Objective:      Hyperkalemia              Action/Plan: Received case management consult that Medicaid not showing on account. Verified that patient does have Medicare part A and B and Medicaid.   Noted in system that [facility] uses [facility] on [address]. Noted prescriptions filled with start date 04/10/2018. Pharmacist on 44M made call yesterday to [facility] to verify that lantus was picked up 04/15/2018 with patient out of pocket cost less than $4 for the lantus.  Expected Discharge Date:                  Expected Discharge Plan:     In-House Referral:     Discharge planning Services     Post Acute Care Choice:    Choice offered to:     DME Arranged:    DME Agency:     HH Arranged:    HH Agency:     Status of Service:     If discussed at [employer], dates discussed:    Additional Comments:  Bartholomew Crews, RN 04/24/2018, 8:00 AM

## 2018-04-24 NOTE — Progress Notes (Signed)
[facility] Progress Note  Subjective: no c/o, is interacting more today  Vitals:   04/24/18 0500 04/24/18 0600 04/24/18 0700 04/24/18 0800  BP: (!) 152/92 (!) 146/85 (!) 149/68 (!) 153/112  Pulse: 87 88 86 82  Resp: 15 14 17    Temp:    97.6 F (36.4 C)  TempSrc:    Oral  SpO2: 97% 93% 92% 97%  Weight:      Height:        Inpatient medications: . Chlorhexidine Gluconate Cloth  6 each Topical Q0600  . cinacalcet  90 mg Oral Q T,Th,Sa-HD  . FLUoxetine  20 mg Oral Daily  . heparin  5,000 Units Subcutaneous Q8H  . insulin aspart  0-5 Units Subcutaneous QHS  . insulin aspart  0-9 Units Subcutaneous TID WC  . isosorbide-hydrALAZINE  1 tablet Oral TID  . levETIRAcetam  250 mg Oral Q T,Th,Sat-1800  . levETIRAcetam  500 mg Oral Q0600  . minoxidil  5 mg Oral q1800  . multivitamin  1 tablet Oral QHS  . NIFEdipine  30 mg Oral BID  . polyethylene glycol  17 g Oral Daily  . risperiDONE  1 mg Oral BID  . senna-docusate  1 tablet Oral BID    dextrose, docusate sodium, heparin  Iron/TIBC/Ferritin/ %Sat    Component Value Date/Time   IRON 23 (L) 03/04/2015 0411   TIBC 192 (L) 03/04/2015 0411   FERRITIN 117 03/04/2015 0411   IRONPCTSAT 12 (L) 03/04/2015 0411    Exam: Gen alert and in no distress, withdrawn today No jvd or bruits Chest clear bilat to bases RRR no MRG Abd soft ntnd no mass or ascites +bs Ext trace LE edema Neuro is alert, Ox 3 , nf LUA AVG +bruit    Home meds:  - nifedipine 30 bid/isosorbide-hydralazine 20-37.5 tid/ minoxidil 5  - risperidone 1mg  bid/ fluoxetine 20 qd  - levetiracetam 500 qd and 250 mg after each HD  - cinacalcet 90 TTS w hd   Dialysis: TTS SW  4h 83min  88.5kg  2/2.25 bath  500/800  LUA AVG  Hep 5000  - mircera 60 every 2 wks  - venofer 50 /wk  - cinacalcet 90 mg po tiw   Assessment: 1. Severe hyperkalemia - resolved w/ IV insulin and HD.  2. ESRD: on HD TTS. HD upstairs today.  3. DKA/ T1DM - ran out of  long acting insulin. Resolved.  4. ESRD on HD: had HD last night. HD tomorrow.  5. Vol excess - at dry wt , BP's up, pull 2 L as tol today 6. HTN - on usual BP meds  7. Seizures - on last admit, taking Keppra po  8. Psych - long hx depression        [facility] 04/24/2018, 9:35 AM  Recent Labs  Lab 04/22/18 2306  04/23/18 1311 04/23/18 2334  NA 118*   < > 140 133*  K 4.8   < > 3.3* 4.0  CL 72*   < > 97* 91*  CO2 17*   < > 25 27  GLUCOSE 1,577*   < > 108* 332*  BUN 80*   < > 29* 32*  CREATININE 11.80*   < > 6.95* 8.29*  CALCIUM 8.0*   < > 8.1* 8.1*  PHOS 7.3*  --   --   --   ALBUMIN 3.6  --   --   --   INR 1.21  --   --   --    < > =  values in this interval not displayed.   Recent Labs  Lab 04/22/18 2306  AST 34  ALT 28  ALKPHOS 67  BILITOT 1.0  PROT 7.2   Recent Labs  Lab 04/22/18 1815 04/22/18 2306 04/23/18 1311  WBC 12.4* 10.2 10.4  NEUTROABS 10.7*  --   --   HGB 10.3* 8.7* 8.8*  HCT 35.6* 28.2* 25.2*  MCV 96.5 89.0 80.5  PLT 296 269 248

## 2018-04-24 NOTE — Progress Notes (Signed)
[facility] Progress Note Patient Name: Edward Colon DOB: [birth year] MRN: [id number]   Date of Service  04/24/2018  HPI/Events of Note  Hyperglycemia - Blood glucose = 335. Currently on Q 4 hour standard Novolog SSI.  eICU Interventions  Will order: 1. Increase to Q 4 hour moderate Novolog SSI.      Intervention Category Major Interventions: Hyperglycemia - active titration of insulin therapy  Latecia Miler [place] 04/24/2018, 12:15 AM

## 2018-04-24 NOTE — Progress Notes (Signed)
[facility] TEAM 1 - Stepdown/ICU TEAM  Kadir Azucena  OXB:[id number] DOB: 01-17-1981 DOA: 04/22/2018 PCP: Nolene Ebbs, MD    Brief Narrative:  38yo w/ DM1 and multiple admissions for hyperglycemia due to noncompliance who presented to [facility] 04/22/2018 with glucose greater than 1600 and K+ greater than 7.5. He suffered a cardiac arrest in January 2020 under similar circumstances. He was transferred to the [facility] ICU for treatment of his hyperosmolar nonketotic acidosis.   Significant Events: 2/16 admit at [facility] - transfer to [facility] ICU  Subjective: The pt does not open his eyes for my exam. He is in no apparent distress, he simply appears to be withdrawn. There is no evidence of uncontrolled pain   Assessment & Plan:  Recurrent hyperosmolar nonketotic acidosis - Labile DM1 due to noncompliance with medication (had not taken his insulin in over 7 days) - presenting glucose > 1600 - rapidly corrected on HD, then developed hypoglycemia - now facing recurring hypoglycemia - hopefully HD today will help stabilize things - follow   End-stage renal disease Nephrology following - for HD today   Pseudo-Hyponatremia  Corrected w/ normalization of glucose   Hypertension Continue usual home meds   History of ventricular tachycardia Monitoring on tele   Depression w/ hx of Suicide attempt Psychiatry consulted to help determine if his noncompliance is a passive suicidal gesture   History of Seizures  Normocytic anemia   DVT prophylaxis: SQ heparin  Code Status: FULL CODE Family Communication: no family present at time of exam  Disposition Plan: keep in current bed due to extreme lability in CBGs  Consultants:  PCCM Nephrology   Antimicrobials:  none   Objective: Blood pressure (!) 153/112, pulse 82, temperature 97.6 F (36.4 C), temperature source Oral, resp. rate 17, height 5\' 9"  (1.753 m), weight 88.3 kg, SpO2 97 %.  Intake/Output Summary (Last 24 hours)  at 04/24/2018 0910 Last data filed at 04/24/2018 0600 Gross per 24 hour  Intake 1201.73 ml  Output -  Net 1201.73 ml   Filed Weights   04/22/18 1830 04/23/18 0015 04/23/18 0443  Weight: 95.3 kg 89.1 kg 88.3 kg    Examination: General: No acute respiratory distress Lungs: Clear to auscultation bilaterally without wheezes or crackles Cardiovascular: Regular rate and rhythm without murmur gallop or rub Abdomen: Nondistended, soft, bowel sounds positive, no rebound, no ascites, no appreciable mass Extremities: No significant cyanosis, clubbing, or edema bilateral lower extremities  CBC: Recent Labs  Lab 04/22/18 1815 04/22/18 2306 04/23/18 1311  WBC 12.4* 10.2 10.4  NEUTROABS 10.7*  --   --   HGB 10.3* 8.7* 8.8*  HCT 35.6* 28.2* 25.2*  MCV 96.5 89.0 80.5  PLT 296 269 683   Basic Metabolic Panel: Recent Labs  Lab 04/22/18 2306  04/23/18 0736 04/23/18 1311 04/23/18 2334  NA 118*   < > 140 140 133*  K 4.8   < > 3.7 3.3* 4.0  CL 72*   < > 100 97* 91*  CO2 17*   < > 24 25 27   GLUCOSE 1,577*   < > 70 108* 332*  BUN 80*   < > 28* 29* 32*  CREATININE 11.80*   < > 6.21* 6.95* 8.29*  CALCIUM 8.0*   < > 8.3* 8.1* 8.1*  MG 2.3  --   --   --   --   PHOS 7.3*  --   --   --   --    < > = values  in this interval not displayed.   GFR: Estimated Creatinine Clearance: 13.7 mL/min (A) (by C-G formula based on SCr of 8.29 mg/dL (H)).  Liver Function Tests: Recent Labs  Lab 04/22/18 2306  AST 34  ALT 28  ALKPHOS 67  BILITOT 1.0  PROT 7.2  ALBUMIN 3.6   Recent Labs  Lab 04/22/18 2306  LIPASE 124*    Coagulation Profile: Recent Labs  Lab 04/22/18 2306  INR 1.21    HbA1C: Hemoglobin A1C  Date/Time Value Ref Range Status  12/14/2016 10:37 AM 9.7  Final  04/11/2016 10:43 AM 9.7  Final   Hgb A1c MFr Bld  Date/Time Value Ref Range Status  02/13/2018 03:24 AM 9.1 (H) 4.8 - 5.6 % Final    Comment:    (NOTE) Pre diabetes:          5.7%-6.4% Diabetes:               >6.4% Glycemic control for   <7.0% adults with diabetes   08/31/2014 04:42 AM 10.3 (H) 4.8 - 5.6 % Final    Comment:    (NOTE)         Pre-diabetes: 5.7 - 6.4         Diabetes: >6.4         Glycemic control for adults with diabetes: <7.0     CBG: Recent Labs  Lab 04/23/18 2352 04/24/18 0309 04/24/18 0743 04/24/18 0745 04/24/18 0805  GLUCAP 335* 311* 20* 17* 113*    Recent Results (from the past 240 hour(s))  MRSA PCR Screening     Status: None   Collection Time: 04/22/18 11:04 PM  Result Value Ref Range Status   MRSA by PCR NEGATIVE NEGATIVE Final    Comment:        The GeneXpert MRSA Assay (FDA approved for NASAL specimens only), is one component of a comprehensive MRSA colonization surveillance program. It is not intended to diagnose MRSA infection nor to guide or monitor treatment for MRSA infections. Performed at [facility], [place] [address] [place], [place] [address]      Scheduled Meds: . Chlorhexidine Gluconate Cloth  6 each Topical Q0600  . cinacalcet  90 mg Oral Q T,Th,Sa-HD  . dextrose  25 mL Intravenous Once  . FLUoxetine  20 mg Oral Daily  . heparin  5,000 Units Subcutaneous Q8H  . insulin aspart  0-15 Units Subcutaneous Q4H  . isosorbide-hydrALAZINE  1 tablet Oral TID  . levETIRAcetam  250 mg Oral Q T,Th,Sat-1800  . levETIRAcetam  500 mg Oral Q0600  . multivitamin  1 tablet Oral QHS  . NIFEdipine  30 mg Oral BID  . risperiDONE  1 mg Oral BID     LOS: 2 days   Cherene Altes, MD [facility] Office  [phone] Pager - Text Page per Amion  If 7PM-7AM, please contact night-coverage per Amion 04/24/2018, 9:10 AM

## 2018-04-24 NOTE — Progress Notes (Addendum)
Report given to RN HD. Spoke w/ Dr. Thereasa Solo who states pt ok to leave unit to go to HD.

## 2018-04-24 NOTE — Progress Notes (Signed)
Inpatient Diabetes Program Recommendations  AACE/ADA: New Consensus Statement on Inpatient Glycemic Control (2015)  Target Ranges:  Prepandial:   less than 140 mg/dL      Peak postprandial:   less than 180 mg/dL (1-2 hours)      Critically ill patients:  140 - 180 mg/dL   Lab Results  Component Value Date   GLUCAP 113 (H) 04/24/2018   HGBA1C 9.1 (H) 02/13/2018    Review of Glycemic Control Results for Edward Colon, Edward Colon (MRN [id number]) as of 04/24/2018 09:17  Ref. Range 04/23/2018 23:52 04/24/2018 03:09 04/24/2018 07:43 04/24/2018 07:45 04/24/2018 08:05  Glucose-Capillary Latest Ref Range: 70 - 99 mg/dL 335 (H) 311 (H) 20 (LL) 17 (LL) 113 (H)   Diabetes history: DM1 Outpatient Diabetes medications: Lantus 14 units daily + Novolog 5 units tid meal coverage + Novolog 0-9 units tid correction Current orders for Inpatient glycemic control: Levemir 5 units bid + Novolog 1-3 units q 4 hrs.  Inpatient Diabetes Program Recommendations:   Change Novolog correction scale to tid + hs Continue Levemir 5 units bid Add Novolog 3 units tid meal coverage if eats 50%  Thank you, Bethena Roys E. Jalicia Roszak, RN, MSN, CDE  Diabetes Coordinator Inpatient Glycemic Control Team Team Pager [phone] (8am-5pm) 04/24/2018 9:27 AM

## 2018-04-24 NOTE — Progress Notes (Signed)
CBG checked and covered before being transported to HD by charge RN and transport tech.  Did relay to receiving HD RN that pts CBG can drop rapidly and pt may need to be spot checked throughout tx.

## 2018-04-24 NOTE — Progress Notes (Addendum)
Pts CBG 20 this AM. 48ml D50 given and recheck 15 min later showed 113. Relayed to Dr. Thereasa Solo

## 2018-04-24 NOTE — Progress Notes (Signed)
Pt diaphoretic in bed. CBG rechecked. 404. Pt still remains oriented X4 and alert. He states he feels fine. Patients seems very agitated and upset by staff's refusal of giving him juice and a snack. Patient educated on why juice is not the best option at the moment. Patient stated he did not care. Patient given his bedtime snack and a cup of water. Will continue to monitor CBG readings.

## 2018-04-24 NOTE — Consult Note (Signed)
[place] [facility] Consult   Reason for Consult:  "depression/ questionable intential / non-compliance behaviour" Referring Physician:  Dr. Thereasa Solo Patient Identification: Edward Colon MRN:  [id number] Principal Diagnosis: Depression Diagnosis:  Principal Problem:   Depression Active Problems:   Hyperkalemia   Hyperglycemia   MDD (major depressive disorder), recurrent severe, without psychosis ([place])   Total Time spent with patient: 1 hour  Subjective:   Edward Colon is a 38 y.o. male patient admitted with recurrent hyperosmolar nonketotic acidosis.  HPI:   Per chart review, patient was admitted with recurrent hyperosmolar nonketotic acidosis secondary to medication noncompliance. His glucose was greater than 1600 on admission. He suffered a cardiac arrest in January 2020 under similar circumstances. He has ERSD requiring dialysis. He has a history of depression. There is concern for noncompliance as a form of passive SI.   Of note, patient was last admitted to [facility] (1/22-2/5) as a transfer from [facility] where he was seen by the psychiatry consult service. He was noted to have severe depression near catatonia/psychosis. He responded well to Risperdal 1 mg BID and Prozac 20 mg daily. There was initial concern that he would require ECT.   On interview, [name] reports frustration about his medical condition. He denies poor medication compliance and reports that he had a problem with the pharmacy which prevented him from obtaining his insulin and this lead to his recent hospitalization. He reports that his mood has been depressed due to multiple stressors including family strain and medical problems. He denies SI, HI or AVH. He reports poor sleep and appetite. He has lost 14 pounds over the past month. He has not followed up with a psychiatrist since discharge from [facility] in January.   Past Psychiatric History: Recurrent MDD  Risk to Self:  None.  Denies SI.  Risk to Others:  None. Denies HI.  Prior Inpatient Therapy:  He was hospitalized at [facility] in 03/2018 for depression with possible psychosis.  Prior Outpatient Therapy:  Denies    Past Medical History:  Past Medical History:  Diagnosis Date  . Anemia   . Blind left eye since ~ 2010  . Cardiac arrest ([place])   . Depression   . Diabetic peripheral neuropathy ([place])   . ESRD (end stage renal disease) on dialysis [facility])    "TTS; [facility]; [facility]" (02/01/2016)  . Heart murmur    denies any problems with it  . Hypertension   . Seizures ([place])    "last one was end of 2016; they are related to my diabetes" (02/01/2016)  . Type 1 diabetes ([place]) dx'd 1990    Past Surgical History:  Procedure Laterality Date  . AV FISTULA PLACEMENT Right 03/03/2015   Procedure: RIGHT RADIO-CEPHALIC ARTERIOVENOUS (AV) FISTULA CREATION;  Surgeon: Serafina Mitchell, MD;  Location: [facility] OR;  Service: Vascular;  Laterality: Right;  . AV FISTULA PLACEMENT Left 06/15/2015   Procedure: INSERTION OF LEFT UPPER ARM  ARTERIOVENOUS (AV) 30mm x 50cm GORE-TEX GRAFT;  Surgeon: Elam Dutch, MD;  Location: [facility] [facility];  Service: Vascular;  Laterality: Left;  . BASCILIC VEIN TRANSPOSITION Left 04/01/2015   Procedure: BASCILIC VEIN TRANSPOSITION-LEFT ARM- FIRST STAGE;  Surgeon: Elam Dutch, MD;  Location: [facility];  Service: Vascular;  Laterality: Left;  . EYE SURGERY Right ~ 2010   for diabetic retinopathy  . INSERTION OF DIALYSIS CATHETER Right 03/03/2015   Procedure: INSERTION OF DIALYSIS CATHETER;  Surgeon: Serafina Mitchell, MD;  Location: [facility];  Service: Vascular;  Laterality: Right;   Family History:  Family History  Problem Relation Age of Onset  . Diabetes Neg Hx    Family Psychiatric  History: Denies  Social History:  Social History   Substance and Sexual Activity  Alcohol Use No  . Frequency: Never   Comment: 02/01/2016 "might have 1 drink/year"     Social History   Substance and Sexual Activity   Drug Use No    Social History   Socioeconomic History  . Marital status: Single    Spouse name: Not on file  . Number of children: Not on file  . Years of education: Not on file  . Highest education level: Not on file  Occupational History  . Not on file  Social Needs  . Financial resource strain: Not on file  . Food insecurity:    Worry: Not on file    Inability: Not on file  . Transportation needs:    Medical: Not on file    Non-medical: Not on file  Tobacco Use  . Smoking status: Never Smoker  . Smokeless tobacco: Never Used  Substance and Sexual Activity  . Alcohol use: No    Frequency: Never    Comment: 02/01/2016 "might have 1 drink/year"  . Drug use: No  . Sexual activity: Yes  Lifestyle  . Physical activity:    Days per week: Not on file    Minutes per session: Not on file  . Stress: Not on file  Relationships  . Social connections:    Talks on phone: Not on file    Gets together: Not on file    Attends religious service: Not on file    Active member of club or organization: Not on file    Attends meetings of clubs or organizations: Not on file    Relationship status: Not on file  Other Topics Concern  . Not on file  Social History Narrative  . Not on file   Additional Social History: He lives at home with his family. He is unemployed and receives disability. He denies alcohol or illicit substance use.     Allergies:  No Known Allergies  Labs:  Results for orders placed or performed during the hospital encounter of 04/22/18 (from the past 48 hour(s))  CBG monitoring, ED     Status: Abnormal   Collection Time: 04/22/18  5:51 PM  Result Value Ref Range   Glucose-Capillary >600 (HH) 70 - 99 mg/dL  Basic metabolic panel     Status: Abnormal   Collection Time: 04/22/18  6:15 PM  Result Value Ref Range   Sodium 113 (LL) 135 - 145 mmol/L    Comment: CRITICAL RESULT CALLED TO, READ BACK BY AND VERIFIED WITH: MESNER,J. MD AT 1287 ON 04/22/2018 BY EVA     Potassium >7.5 (HH) 3.5 - 5.1 mmol/L    Comment: CRITICAL RESULT CALLED TO, READ BACK BY AND VERIFIED WITH: MESNER J. AT 1846 ON 04/22/2018 BY EVA    Chloride 70 (L) 98 - 111 mmol/L   CO2 15 (L) 22 - 32 mmol/L   Glucose, Bld 1,649 (HH) 70 - 99 mg/dL    Comment: RESULTS CONFIRMED BY MANUAL DILUTION CRITICAL RESULT CALLED TO, READ BACK BY AND VERIFIED WITH: MESNER,J. AT 1905 ON 04/22/2018 BY EVA    BUN 78 (H) 6 - 20 mg/dL   Creatinine, Ser 11.29 (H) 0.61 - 1.24 mg/dL   Calcium 7.9 (L) 8.9 - 10.3 mg/dL   GFR calc non  Af Amer 5 (L) >60 mL/min   GFR calc Af Amer 6 (L) >60 mL/min   Anion gap 28 (H) 5 - 15    Comment: Performed at [facility], [address] [place], [place] [address]  Blood gas, venous     Status: Abnormal   Collection Time: 04/22/18  6:15 PM  Result Value Ref Range   FIO2 21.00    pH, Ven 7.127 (LL) 7.250 - 7.430    Comment: CRITICAL RESULT CALLED TO, READ BACK BY AND VERIFIED WITH: VOGLER,T. AT 1831 ON 04/22/2018 BY EVA    pCO2, Ven 50.3 44.0 - 60.0 mmHg   pO2, Ven 52.3 (H) 32.0 - 45.0 mmHg   Bicarbonate 14.3 (L) 20.0 - 28.0 mmol/L   Acid-base deficit 11.7 (H) 0.0 - 2.0 mmol/L   O2 Saturation 76.5 %   Patient temperature 37.0     Comment: Performed at [facility], [address] [place], [place] [address]  CBC with Differential (PNL)     Status: Abnormal   Collection Time: 04/22/18  6:15 PM  Result Value Ref Range   WBC 12.4 (H) 4.0 - 10.5 K/uL   RBC 3.69 (L) 4.22 - 5.81 MIL/uL   Hemoglobin 10.3 (L) 13.0 - 17.0 g/dL   HCT 35.6 (L) 39.0 - 52.0 %   MCV 96.5 80.0 - 100.0 fL   MCH 27.9 26.0 - 34.0 pg   MCHC 28.9 (L) 30.0 - 36.0 g/dL   RDW 13.3 11.5 - 15.5 %   Platelets 296 150 - 400 K/uL   nRBC 0.0 0.0 - 0.2 %   Neutrophils Relative % 86 %   Neutro Abs 10.7 (H) 1.7 - 7.7 K/uL   Lymphocytes Relative 6 %   Lymphs Abs 0.8 0.7 - 4.0 K/uL   Monocytes Relative 7 %   Monocytes Absolute 0.9 0.1 - 1.0 K/uL   Eosinophils Relative 0 %   Eosinophils Absolute 0.0  0.0 - 0.5 K/uL   Basophils Relative 0 %   Basophils Absolute 0.0 0.0 - 0.1 K/uL   Immature Granulocytes 1 %   Abs Immature Granulocytes 0.06 0.00 - 0.07 K/uL    Comment: Performed at [facility], [address] [place], [place] [address]  CBG monitoring, ED     Status: Abnormal   Collection Time: 04/22/18  8:06 PM  Result Value Ref Range   Glucose-Capillary >600 (HH) 70 - 99 mg/dL  CBG monitoring, ED     Status: Abnormal   Collection Time: 04/22/18 10:33 PM  Result Value Ref Range   Glucose-Capillary >600 (HH) 70 - 99 mg/dL  MRSA PCR Screening     Status: None   Collection Time: 04/22/18 11:04 PM  Result Value Ref Range   MRSA by PCR NEGATIVE NEGATIVE    Comment:        The GeneXpert MRSA Assay (FDA approved for NASAL specimens only), is one component of a comprehensive MRSA colonization surveillance program. It is not intended to diagnose MRSA infection nor to guide or monitor treatment for MRSA infections. Performed at [facility], [place] [address], [place],  [address]   Comprehensive metabolic panel     Status: Abnormal   Collection Time: 04/22/18 11:06 PM  Result Value Ref Range   Sodium 118 (LL) 135 - 145 mmol/L    Comment: CRITICAL RESULT CALLED TO, READ BACK BY AND VERIFIED WITH: CUMMINGS B,RN 04/23/18 0025 WAYK    Potassium 4.8 3.5 - 5.1 mmol/L   Chloride 72 (L)  98 - 111 mmol/L   CO2 17 (L) 22 - 32 mmol/L   Glucose, Bld 1,577 (HH) 70 - 99 mg/dL    Comment: RESULTS CONFIRMED BY MANUAL DILUTION CRITICAL RESULT CALLED TO, READ BACK BY AND VERIFIED WITH: CUMMINGS B,RN 04/23/18 0025 WAYK    BUN 80 (H) 6 - 20 mg/dL   Creatinine, Ser 11.80 (H) 0.61 - 1.24 mg/dL   Calcium 8.0 (L) 8.9 - 10.3 mg/dL   Total Protein 7.2 6.5 - 8.1 g/dL   Albumin 3.6 3.5 - 5.0 g/dL   AST 34 15 - 41 U/L   ALT 28 0 - 44 U/L   Alkaline Phosphatase 67 38 - 126 U/L   Total Bilirubin 1.0 0.3 - 1.2 mg/dL   GFR calc non Af Amer 5 (L) >60 mL/min   GFR calc Af Amer 6 (L) >60 mL/min    Anion gap 29 (H) 5 - 15    Comment: Performed at [facility] [address], [place], [place] [address]  Magnesium     Status: None   Collection Time: 04/22/18 11:06 PM  Result Value Ref Range   Magnesium 2.3 1.7 - 2.4 mg/dL    Comment: Performed at [facility], [place] [address] [place], [place] [address]  Phosphorus     Status: Abnormal   Collection Time: 04/22/18 11:06 PM  Result Value Ref Range   Phosphorus 7.3 (H) 2.5 - 4.6 mg/dL    Comment: Performed at [facility] [address] [place], [place] [address]  Procalcitonin     Status: None   Collection Time: 04/22/18 11:06 PM  Result Value Ref Range   Procalcitonin 5.35 ng/mL    Comment:        Interpretation: PCT > 2 ng/mL: Systemic infection (sepsis) is likely, unless other causes are known. (NOTE)       Sepsis PCT Algorithm           Lower Respiratory Tract                                      Infection PCT Algorithm    ----------------------------     ----------------------------         PCT < 0.25 ng/mL                PCT < 0.10 ng/mL         Strongly encourage             Strongly discourage   discontinuation of antibiotics    initiation of antibiotics    ----------------------------     -----------------------------       PCT 0.25 - 0.50 ng/mL            PCT 0.10 - 0.25 ng/mL               OR       >80% decrease in PCT            Discourage initiation of                                            antibiotics      Encourage discontinuation           of antibiotics    ----------------------------     -----------------------------  PCT >= 0.50 ng/mL              PCT 0.26 - 0.50 ng/mL               AND       <80% decrease in PCT              Encourage initiation of                                             antibiotics       Encourage continuation           of antibiotics    ----------------------------     -----------------------------        PCT >= 0.50 ng/mL                  PCT >  0.50 ng/mL               AND         increase in PCT                  Strongly encourage                                      initiation of antibiotics    Strongly encourage escalation           of antibiotics                                     -----------------------------                                           PCT <= 0.25 ng/mL                                                 OR                                        > 80% decrease in PCT                                     Discontinue / Do not initiate                                             antibiotics Performed at [facility], [address] [place], [place] [address]   Cortisol     Status: None   Collection Time: 04/22/18 11:06 PM  Result Value Ref Range   Cortisol, Plasma 18.4 ug/dL    Comment: (NOTE) AM    6.7 - 22.6 ug/dL PM   <10.0       ug/dL Performed  at [facility], [place] [address], [place], [place] [address]   CBC     Status: Abnormal   Collection Time: 04/22/18 11:06 PM  Result Value Ref Range   WBC 10.2 4.0 - 10.5 K/uL   RBC 3.17 (L) 4.22 - 5.81 MIL/uL   Hemoglobin 8.7 (L) 13.0 - 17.0 g/dL   HCT 28.2 (L) 39.0 - 52.0 %   MCV 89.0 80.0 - 100.0 fL   MCH 27.4 26.0 - 34.0 pg   MCHC 30.9 30.0 - 36.0 g/dL   RDW 13.2 11.5 - 15.5 %   Platelets 269 150 - 400 K/uL   nRBC 0.0 0.0 - 0.2 %    Comment: Performed at [facility], [place] [address], [place], [place] [address]  Protime-INR     Status: None   Collection Time: 04/22/18 11:06 PM  Result Value Ref Range   Prothrombin Time 15.2 11.4 - 15.2 seconds   INR 1.21     Comment: Performed at [facility] [address], [place], [place] [address]  APTT     Status: None   Collection Time: 04/22/18 11:06 PM  Result Value Ref Range   aPTT 29 24 - 36 seconds    Comment: Performed at [facility] [address], [place], [place] [address]  Lactic acid, plasma     Status: Abnormal   Collection Time: 04/22/18 11:06 PM  Result Value  Ref Range   Lactic Acid, Venous 10.0 (HH) 0.5 - 1.9 mmol/L    Comment: CRITICAL RESULT CALLED TO, READ BACK BY AND VERIFIED WITH: CUMMINGS B,RN 04/22/18 2356 WAYK Performed at [facility], [place] [address] [place], [place] [address]   Lipase, blood     Status: Abnormal   Collection Time: 04/22/18 11:06 PM  Result Value Ref Range   Lipase 124 (H) 11 - 51 U/L    Comment: Performed at [facility], [place] [address], [place], [place] [address]  Beta-hydroxybutyric acid     Status: Abnormal   Collection Time: 04/22/18 11:06 PM  Result Value Ref Range   Beta-Hydroxybutyric Acid 1.15 (H) 0.05 - 0.27 mmol/L    Comment: Performed at [facility] [address], [place], [place] [address]  Glucose, capillary     Status: Abnormal   Collection Time: 04/22/18 11:45 PM  Result Value Ref Range   Glucose-Capillary >600 (HH) 70 - 99 mg/dL   Comment 1 Notify RN   Blood gas, arterial     Status: Abnormal   Collection Time: 04/23/18 12:10 AM  Result Value Ref Range   FIO2 21.00    pH, Arterial 7.271 (L) 7.350 - 7.450   pCO2 arterial 42.9 32.0 - 48.0 mmHg   pO2, Arterial 74.8 (L) 83.0 - 108.0 mmHg   Bicarbonate 19.2 (L) 20.0 - 28.0 mmol/L   Acid-base deficit 6.5 (H) 0.0 - 2.0 mmol/L   O2 Saturation 93.7 %   Patient temperature 98.1    Collection site RIGHT RADIAL    Drawn by [id number]    Sample type ARTERIAL DRAW    Allens test (pass/fail) PASS PASS  Basic metabolic panel     Status: Abnormal   Collection Time: 04/23/18 12:33 AM  Result Value Ref Range   Sodium 120 (L) 135 - 145 mmol/L   Potassium 4.0 3.5 - 5.1 mmol/L   Chloride 79 (L) 98 - 111 mmol/L   CO2 20 (L) 22 - 32 mmol/L  Glucose, Bld 1,419 (HH) 70 - 99 mg/dL    Comment: RESULTS CONFIRMED BY MANUAL DILUTION CRITICAL RESULT CALLED TO, READ BACK BY AND VERIFIED WITH: CUMMINGS B,RN 04/23/18 0122 WAYK    BUN 81 (H) 6 - 20 mg/dL   Creatinine, Ser 12.02 (H) 0.61 - 1.24 mg/dL   Calcium 8.1 (L) 8.9 - 10.3 mg/dL   GFR calc  non Af Amer 5 (L) >60 mL/min   GFR calc Af Amer 6 (L) >60 mL/min   Anion gap 21 (H) 5 - 15    Comment: Performed at [facility] [address], [place], [place] [address]  Glucose, capillary     Status: Abnormal   Collection Time: 04/23/18  1:05 AM  Result Value Ref Range   Glucose-Capillary >600 (HH) 70 - 99 mg/dL  Glucose, capillary     Status: Abnormal   Collection Time: 04/23/18  1:59 AM  Result Value Ref Range   Glucose-Capillary 580 (HH) 70 - 99 mg/dL   Comment 1 Notify RN   Glucose, capillary     Status: Abnormal   Collection Time: 04/23/18  3:04 AM  Result Value Ref Range   Glucose-Capillary 296 (H) 70 - 99 mg/dL  Glucose, capillary     Status: Abnormal   Collection Time: 04/23/18  3:49 AM  Result Value Ref Range   Glucose-Capillary 180 (H) 70 - 99 mg/dL  Glucose, capillary     Status: Abnormal   Collection Time: 04/23/18  4:48 AM  Result Value Ref Range   Glucose-Capillary 120 (H) 70 - 99 mg/dL  Blood gas, arterial     Status: Abnormal   Collection Time: 04/23/18  5:00 AM  Result Value Ref Range   FIO2 21.00    pH, Arterial 7.428 7.350 - 7.450   pCO2 arterial 45.5 32.0 - 48.0 mmHg   pO2, Arterial 77.4 (L) 83.0 - 108.0 mmHg   Bicarbonate 29.5 (H) 20.0 - 28.0 mmol/L   Acid-Base Excess 5.3 (H) 0.0 - 2.0 mmol/L   O2 Saturation 96.3 %   Patient temperature 98.6    Collection site RIGHT RADIAL    Drawn by [phone]    Sample type ARTERIAL DRAW    Allens test (pass/fail) PASS PASS  Glucose, capillary     Status: Abnormal   Collection Time: 04/23/18  5:52 AM  Result Value Ref Range   Glucose-Capillary 34 (LL) 70 - 99 mg/dL   Comment 1 Notify RN   Glucose, capillary     Status: None   Collection Time: 04/23/18  6:08 AM  Result Value Ref Range   Glucose-Capillary 78 70 - 99 mg/dL  Glucose, capillary     Status: Abnormal   Collection Time: 04/23/18  7:06 AM  Result Value Ref Range   Glucose-Capillary 35 (LL) 70 - 99 mg/dL   Comment 1 Notify RN   Glucose,  capillary     Status: None   Collection Time: 04/23/18  7:21 AM  Result Value Ref Range   Glucose-Capillary 72 70 - 99 mg/dL  Basic metabolic panel     Status: Abnormal   Collection Time: 04/23/18  7:36 AM  Result Value Ref Range   Sodium 140 135 - 145 mmol/L   Potassium 3.7 3.5 - 5.1 mmol/L   Chloride 100 98 - 111 mmol/L   CO2 24 22 - 32 mmol/L   Glucose, Bld 70 70 - 99 mg/dL   BUN 28 (H) 6 - 20 mg/dL   Creatinine, Ser 6.21 (H)  0.61 - 1.24 mg/dL    Comment: DIALYSIS   Calcium 8.3 (L) 8.9 - 10.3 mg/dL   GFR calc non Af Amer 11 (L) >60 mL/min   GFR calc Af Amer 12 (L) >60 mL/min   Anion gap 16 (H) 5 - 15    Comment: Performed at [facility] [address], [place], [place] [address]  Lactic acid, plasma     Status: None   Collection Time: 04/23/18  7:36 AM  Result Value Ref Range   Lactic Acid, Venous 1.8 0.5 - 1.9 mmol/L    Comment: Performed at [facility] [address] [place], [place] [address]  Glucose, capillary     Status: Abnormal   Collection Time: 04/23/18  8:03 AM  Result Value Ref Range   Glucose-Capillary 53 (L) 70 - 99 mg/dL  Glucose, capillary     Status: None   Collection Time: 04/23/18  8:25 AM  Result Value Ref Range   Glucose-Capillary 99 70 - 99 mg/dL  Glucose, capillary     Status: None   Collection Time: 04/23/18  9:36 AM  Result Value Ref Range   Glucose-Capillary 83 70 - 99 mg/dL  Glucose, capillary     Status: Abnormal   Collection Time: 04/23/18 10:42 AM  Result Value Ref Range   Glucose-Capillary 67 (L) 70 - 99 mg/dL  Glucose, capillary     Status: Abnormal   Collection Time: 04/23/18 11:08 AM  Result Value Ref Range   Glucose-Capillary 122 (H) 70 - 99 mg/dL  Glucose, capillary     Status: None   Collection Time: 04/23/18 12:35 PM  Result Value Ref Range   Glucose-Capillary 97 70 - 99 mg/dL  CBC     Status: Abnormal   Collection Time: 04/23/18  1:11 PM  Result Value Ref Range   WBC 10.4 4.0 - 10.5 K/uL   RBC 3.13 (L)  4.22 - 5.81 MIL/uL   Hemoglobin 8.8 (L) 13.0 - 17.0 g/dL   HCT 25.2 (L) 39.0 - 52.0 %   MCV 80.5 80.0 - 100.0 fL    Comment: REPEATED TO VERIFY   MCH 28.1 26.0 - 34.0 pg   MCHC 34.9 30.0 - 36.0 g/dL   RDW 13.3 11.5 - 15.5 %   Platelets 248 150 - 400 K/uL   nRBC 0.0 0.0 - 0.2 %    Comment: Performed at [facility], [place]. [address] [place], [place] [address]  Lactic acid, plasma     Status: None   Collection Time: 04/23/18  1:11 PM  Result Value Ref Range   Lactic Acid, Venous 1.0 0.5 - 1.9 mmol/L    Comment: Performed at [facility] [address], [place], [place] [address]  Basic metabolic panel     Status: Abnormal   Collection Time: 04/23/18  1:11 PM  Result Value Ref Range   Sodium 140 135 - 145 mmol/L   Potassium 3.3 (L) 3.5 - 5.1 mmol/L   Chloride 97 (L) 98 - 111 mmol/L   CO2 25 22 - 32 mmol/L   Glucose, Bld 108 (H) 70 - 99 mg/dL   BUN 29 (H) 6 - 20 mg/dL   Creatinine, Ser 6.95 (H) 0.61 - 1.24 mg/dL   Calcium 8.1 (L) 8.9 - 10.3 mg/dL   GFR calc non Af Amer 9 (L) >60 mL/min   GFR calc Af Amer 11 (L) >60 mL/min   Anion gap 18 (H) 5 - 15  Comment: Performed at [facility], [place] [address] [place], [place] [address]  Glucose, capillary     Status: Abnormal   Collection Time: 04/23/18  1:55 PM  Result Value Ref Range   Glucose-Capillary 103 (H) 70 - 99 mg/dL  Glucose, capillary     Status: Abnormal   Collection Time: 04/23/18  2:58 PM  Result Value Ref Range   Glucose-Capillary 111 (H) 70 - 99 mg/dL  Glucose, capillary     Status: Abnormal   Collection Time: 04/23/18  4:12 PM  Result Value Ref Range   Glucose-Capillary 214 (H) 70 - 99 mg/dL  Glucose, capillary     Status: Abnormal   Collection Time: 04/23/18  5:06 PM  Result Value Ref Range   Glucose-Capillary 213 (H) 70 - 99 mg/dL  Glucose, capillary     Status: Abnormal   Collection Time: 04/23/18  5:29 PM  Result Value Ref Range   Glucose-Capillary 199 (H) 70 - 99 mg/dL  Glucose,  capillary     Status: Abnormal   Collection Time: 04/23/18  6:28 PM  Result Value Ref Range   Glucose-Capillary 195 (H) 70 - 99 mg/dL  Glucose, capillary     Status: Abnormal   Collection Time: 04/23/18  7:57 PM  Result Value Ref Range   Glucose-Capillary 157 (H) 70 - 99 mg/dL  Basic metabolic panel     Status: Abnormal   Collection Time: 04/23/18 11:34 PM  Result Value Ref Range   Sodium 133 (L) 135 - 145 mmol/L   Potassium 4.0 3.5 - 5.1 mmol/L   Chloride 91 (L) 98 - 111 mmol/L   CO2 27 22 - 32 mmol/L   Glucose, Bld 332 (H) 70 - 99 mg/dL   BUN 32 (H) 6 - 20 mg/dL   Creatinine, Ser 8.29 (H) 0.61 - 1.24 mg/dL   Calcium 8.1 (L) 8.9 - 10.3 mg/dL   GFR calc non Af Amer 8 (L) >60 mL/min   GFR calc Af Amer 9 (L) >60 mL/min   Anion gap 15 5 - 15    Comment: Performed at [facility], [place] [address] [place], [place] [address]  Glucose, capillary     Status: Abnormal   Collection Time: 04/23/18 11:52 PM  Result Value Ref Range   Glucose-Capillary 335 (H) 70 - 99 mg/dL  Glucose, capillary     Status: Abnormal   Collection Time: 04/24/18  3:09 AM  Result Value Ref Range   Glucose-Capillary 311 (H) 70 - 99 mg/dL  Glucose, capillary     Status: Abnormal   Collection Time: 04/24/18  7:43 AM  Result Value Ref Range   Glucose-Capillary 20 (LL) 70 - 99 mg/dL   Comment 1 Notify RN   Glucose, capillary     Status: Abnormal   Collection Time: 04/24/18  7:45 AM  Result Value Ref Range   Glucose-Capillary 17 (LL) 70 - 99 mg/dL   Comment 1 Notify RN   Glucose, capillary     Status: Abnormal   Collection Time: 04/24/18  8:05 AM  Result Value Ref Range   Glucose-Capillary 113 (H) 70 - 99 mg/dL  Glucose, capillary     Status: Abnormal   Collection Time: 04/24/18 11:06 AM  Result Value Ref Range   Glucose-Capillary 217 (H) 70 - 99 mg/dL  Renal function panel     Status: Abnormal   Collection Time: 04/24/18 11:40 AM  Result Value Ref Range   Sodium 135 135 - 145 mmol/L  Potassium 4.5  3.5 - 5.1 mmol/L   Chloride 94 (L) 98 - 111 mmol/L   CO2 24 22 - 32 mmol/L   Glucose, Bld 320 (H) 70 - 99 mg/dL   BUN 37 (H) 6 - 20 mg/dL   Creatinine, Ser 9.57 (H) 0.61 - 1.24 mg/dL   Calcium 8.2 (L) 8.9 - 10.3 mg/dL   Phosphorus 7.0 (H) 2.5 - 4.6 mg/dL   Albumin 3.2 (L) 3.5 - 5.0 g/dL   GFR calc non Af Amer 6 (L) >60 mL/min   GFR calc Af Amer 7 (L) >60 mL/min   Anion gap 17 (H) 5 - 15    Comment: Performed at [facility], [address] [place], [place] [address]  CBC     Status: Abnormal   Collection Time: 04/24/18 11:40 AM  Result Value Ref Range   WBC 5.9 4.0 - 10.5 K/uL   RBC 3.24 (L) 4.22 - 5.81 MIL/uL   Hemoglobin 9.0 (L) 13.0 - 17.0 g/dL   HCT 27.2 (L) 39.0 - 52.0 %   MCV 84.0 80.0 - 100.0 fL   MCH 27.8 26.0 - 34.0 pg   MCHC 33.1 30.0 - 36.0 g/dL   RDW 13.8 11.5 - 15.5 %   Platelets 244 150 - 400 K/uL   nRBC 0.0 0.0 - 0.2 %    Comment: Performed at [facility], [place] [address], [place], [place] [address]    Current Facility-Administered Medications  Medication Dose Route Frequency Provider Last Rate Last Dose  . calcium acetate (PHOSLO) capsule 2,001 mg  2,001 mg Oral TID WC Roney Jaffe, MD   2,001 mg at 04/24/18 1042  . Chlorhexidine Gluconate Cloth 2 % PADS 6 each  6 each Topical Q0600 Minor, Grace Bushy, NP      . cinacalcet (SENSIPAR) tablet 90 mg  90 mg Oral Q T,Th,Sa-HD Caren Griffins, MD   90 mg at 04/24/18 1110  . dextrose 50 % solution 50 mL  1 ampule Intravenous PRN Cherene Altes, MD      . docusate sodium [facility]) enema 283 mg  1 enema Rectal Daily PRN Joette Catching T, MD      . FLUoxetine (PROZAC) capsule 20 mg  20 mg Oral Daily Marzetta Board M, MD   20 mg at 04/24/18 1019  . heparin 1000 UNIT/ML injection           . heparin injection 5,000 Units  5,000 Units Dialysis PRN Minor, Grace Bushy, NP   5,000 Units at 04/23/18 0025  . heparin injection 5,000 Units  5,000 Units Subcutaneous Q8H Minor, Grace Bushy, NP   5,000 Units at  04/24/18 0532  . [START ON 04/25/2018] heparin injection 5,000 Units  5,000 Units Dialysis Once in dialysis Roney Jaffe, MD      . insulin aspart (novoLOG) injection 0-5 Units  0-5 Units Subcutaneous QHS Joette Catching T, MD      . insulin aspart (novoLOG) injection 0-9 Units  0-9 Units Subcutaneous TID WC Cherene Altes, MD   3 Units at 04/24/18 1111  . isosorbide-hydrALAZINE (BIDIL) 20-37.5 MG per tablet 1 tablet  1 tablet Oral TID Caren Griffins, MD   1 tablet at 04/24/18 1019  . levETIRAcetam (KEPPRA) tablet 250 mg  250 mg Oral Q T,Th,Sat-1800 Gherghe, Costin M, MD      . levETIRAcetam (KEPPRA) tablet 500 mg  500 mg Oral Z1696 Caren Griffins, MD   500 mg at 04/24/18 0532  .  minoxidil (LONITEN) tablet 5 mg  5 mg Oral q1800 Cherene Altes, MD      . multivitamin (RENA-VIT) tablet 1 tablet  1 tablet Oral QHS Caren Griffins, MD   1 tablet at 04/23/18 2214  . NIFEdipine (PROCARDIA-XL/NIFEDICAL-XL) 24 hr tablet 30 mg  30 mg Oral BID Caren Griffins, MD   30 mg at 04/24/18 1019  . polyethylene glycol (MIRALAX / GLYCOLAX) packet 17 g  17 g Oral Daily Cherene Altes, MD   17 g at 04/24/18 1018  . risperiDONE (RISPERDAL) tablet 1 mg  1 mg Oral BID Caren Griffins, MD   1 mg at 04/24/18 1019  . senna-docusate (Senokot-S) tablet 1 tablet  1 tablet Oral BID Cherene Altes, MD   1 tablet at 04/24/18 1020    Musculoskeletal: Strength & Muscle Tone: within normal limits Gait & Station: UTA since lying in bed. Patient leans: N/A  Psychiatric Specialty Exam: Physical Exam  Nursing note and vitals reviewed. Constitutional: He is oriented to person, place, and time. He appears well-developed and well-nourished.  HENT:  Head: Normocephalic and atraumatic.  Neck: Normal range of motion.  Respiratory: Effort normal.  Musculoskeletal: Normal range of motion.  Neurological: He is alert and oriented to person, place, and time.  Psychiatric: His behavior is normal. Judgment  and thought content normal. Cognition and memory are normal. He exhibits a depressed mood.    Review of Systems  All other systems reviewed and are negative.   Blood pressure (!) 145/80, pulse 89, temperature 98 F (36.7 C), temperature source Oral, resp. rate 16, height 5\' 9"  (1.753 m), weight 91.4 kg, SpO2 98 %.Body mass index is 29.76 kg/m.  General Appearance: Fairly Groomed, young, African American male, wearing a hospital gown with a beanie cap who is lying in bed. NAD.   Eye Contact:  Good  Speech:  Clear and Coherent and Normal Rate  Volume:  Normal  Mood:  Depressed  Affect:  Constricted  Thought Process:  Goal Directed, Linear and Descriptions of Associations: Intact  Orientation:  Full (Time, Place, and Person)  Thought Content:  Logical  Suicidal Thoughts:  No  Homicidal Thoughts:  No  Memory:  Immediate;   Good Recent;   Good Remote;   Good  Judgement:  Fair  Insight:  Fair  Psychomotor Activity:  Normal  Concentration:  Concentration: Good and Attention Span: Good  Recall:  Good  Fund of Knowledge:  Good  Language:  Good  Akathisia:  No  Handed:  Right  AIMS (if indicated):   N/A  Assets:  Communication Skills Desire for Improvement Financial Resources/Insurance Housing Resilience Social Support  ADL's:  Intact  Cognition:  WNL  Sleep:   Poor   Assessment:  Latavius Capizzi is a 38 y.o. male who was admitted with recurrent hyperosmolar nonketotic acidosis. He endorses depressed mood in the setting of ongoing medical problems. He additionally endorses poor sleep. He denies SI, HI or AVH. He declines medication adjustments at this time. He is willing to follow up with an outpatient psychiatrist. Please have SW provide patient with outpatient mental health resources.    Treatment Plan Summary: -Continue Prozac 20 mg daily for mood. -Continue Risperdal 1 mg BID for mood stabilization/psychosis. -EKG reviewed and QTc 468 on 2/16. Please closely monitor  when starting or increasing QTc prolonging agents.  -Please have SW provide patient with resources for outpatient mental health services.    Disposition: No evidence of imminent risk  to self or others at present.   Patient does not meet criteria for psychiatric inpatient admission.  Faythe Dingwall, DO 04/24/2018 12:55 PM

## 2018-04-25 DIAGNOSIS — F332 Major depressive disorder, recurrent severe without psychotic features: Secondary | ICD-10-CM

## 2018-04-25 DIAGNOSIS — Z9119 Patient's noncompliance with other medical treatment and regimen: Secondary | ICD-10-CM

## 2018-04-25 LAB — COMPREHENSIVE METABOLIC PANEL
ALK PHOS: 60 U/L (ref 38–126)
ALT: 21 U/L (ref 0–44)
AST: 32 U/L (ref 15–41)
Albumin: 3.3 g/dL — ABNORMAL LOW (ref 3.5–5.0)
Anion gap: 11 (ref 5–15)
BUN: 21 mg/dL — ABNORMAL HIGH (ref 6–20)
CO2: 26 mmol/L (ref 22–32)
Calcium: 7.8 mg/dL — ABNORMAL LOW (ref 8.9–10.3)
Chloride: 92 mmol/L — ABNORMAL LOW (ref 98–111)
Creatinine, Ser: 7.16 mg/dL — ABNORMAL HIGH (ref 0.61–1.24)
GFR calc Af Amer: 10 mL/min — ABNORMAL LOW (ref >60)
GFR calc non Af Amer: 9 mL/min — ABNORMAL LOW (ref >60)
Glucose, Bld: 423 mg/dL — ABNORMAL HIGH (ref 70–99)
Potassium: 5.8 mmol/L — ABNORMAL HIGH (ref 3.5–5.1)
Sodium: 129 mmol/L — ABNORMAL LOW (ref 135–145)
Total Bilirubin: 1.1 mg/dL (ref 0.3–1.2)
Total Protein: 6.9 g/dL (ref 6.5–8.1)

## 2018-04-25 LAB — CBC
HCT: 29.2 % — ABNORMAL LOW (ref 39.0–52.0)
Hemoglobin: 9.7 g/dL — ABNORMAL LOW (ref 13.0–17.0)
MCH: 28.4 pg (ref 26.0–34.0)
MCHC: 33.2 g/dL (ref 30.0–36.0)
MCV: 85.4 fL (ref 80.0–100.0)
PLATELETS: 205 10*3/uL (ref 150–400)
RBC: 3.42 MIL/uL — ABNORMAL LOW (ref 4.22–5.81)
RDW: 14 % (ref 11.5–15.5)
WBC: 4.8 10*3/uL (ref 4.0–10.5)
nRBC: 0 % (ref 0.0–0.2)

## 2018-04-25 LAB — BASIC METABOLIC PANEL
Anion gap: 15 (ref 5–15)
BUN: 29 mg/dL — ABNORMAL HIGH (ref 6–20)
CO2: 24 mmol/L (ref 22–32)
Calcium: 8 mg/dL — ABNORMAL LOW (ref 8.9–10.3)
Chloride: 91 mmol/L — ABNORMAL LOW (ref 98–111)
Creatinine, Ser: 8.22 mg/dL — ABNORMAL HIGH (ref 0.61–1.24)
GFR calc Af Amer: 9 mL/min — ABNORMAL LOW (ref >60)
GFR calc non Af Amer: 8 mL/min — ABNORMAL LOW (ref >60)
GLUCOSE: 509 mg/dL — AB (ref 70–99)
Potassium: 4.3 mmol/L (ref 3.5–5.1)
Sodium: 130 mmol/L — ABNORMAL LOW (ref 135–145)

## 2018-04-25 LAB — GLUCOSE, CAPILLARY
Glucose-Capillary: 395 mg/dL — ABNORMAL HIGH (ref 70–99)
Glucose-Capillary: 560 mg/dL (ref 70–99)
Glucose-Capillary: 403 mg/dL — ABNORMAL HIGH (ref 70–99)
Glucose-Capillary: 122 mg/dL — ABNORMAL HIGH (ref 70–99)
Glucose-Capillary: 345 mg/dL — ABNORMAL HIGH (ref 70–99)

## 2018-04-25 LAB — BETA-HYDROXYBUTYRIC ACID: Beta-Hydroxybutyric Acid: 0.22 mmol/L (ref 0.05–0.27)

## 2018-04-25 LAB — POTASSIUM: Potassium: 3.9 mmol/L (ref 3.5–5.1)

## 2018-04-25 MED ORDER — INSULIN ASPART 100 UNIT/ML ~~LOC~~ SOLN
15.0000 [IU] | Freq: Once | SUBCUTANEOUS | Status: AC
  Administered 2018-04-25: 15 [IU] via SUBCUTANEOUS

## 2018-04-25 MED ORDER — CHLORHEXIDINE GLUCONATE CLOTH 2 % EX PADS: 6.0000 | MEDICATED_PAD | Freq: Every day | CUTANEOUS | Status: DC

## 2018-04-25 MED ORDER — INSULIN ASPART PROT & ASPART (70-30 MIX) 100 UNIT/ML ~~LOC~~ SUSP
25.0000 [IU] | Freq: Once | SUBCUTANEOUS | Status: DC
  Filled 2018-04-25: qty 10

## 2018-04-25 MED ORDER — SODIUM POLYSTYRENE SULFONATE 15 GM/60ML PO SUSP
30.0000 g | Freq: Once | ORAL | Status: AC
  Administered 2018-04-25: 30 g via ORAL

## 2018-04-25 NOTE — Progress Notes (Signed)
PROGRESS NOTE  Edward Colon OMB:[id number] DOB: 1980/04/16 DOA: 04/22/2018 PCP: Nolene Ebbs, MD  Brief History   37yo w/ DM1 and multiple admissions for hyperglycemia due to noncompliance who presented to [facility] 04/22/2018 with glucose greater than 1600 and K+ greater than 7.5. He suffered a cardiac arrest in January 2020 under similar circumstances. He was transferred to the [facility] ICU for treatment of his hyperosmolar nonketotic acidosis.   A & P   Recurrent hyperosmolar nonketotic acidosis - Labile DM1 due to noncompliance with medication (had not taken his insulin in over 7 days). Patient presents with glucose > 1600 which rapidly corrected on HD. Pt swings between hypoglycemia and hyperglycemia. Severe hyperglycemia this morning. Monitor.  End-stage renal disease: Nephrology following. For HD tomorrow.   Pseudo-Hyponatremia: Correcting w/ normalization of glucose. 130 this am with glucose of 509.  Hypertension: Blood pressures well controlled this am. Monitor.   History of ventricular tachycardia: Stable. Monitor on telemetry.  Depression w/ hx of Suicide attempt: Pt has been evaluated by psychiatry. They feel that the patient is not a threat to himself or others at this time.  History of Seizures  Normocytic anemia   DVT prophylaxis: SQ heparin  Code Status: FULL CODE Family Communication: no family present at time of exam  Disposition Plan: keep in current bed due to extreme lability in [place], DO [facility] Direct contact: see [email or web address]  7PM-7AM contact night coverage as above 04/25/2018, 4:31 PM  LOS: 3 days   Consultants  . Nephrology, psychiatry  Interval History/Subjective  37yo w/ DM1 and multiple admissions for hyperglycemia due to noncompliance who presented to [facility] 04/22/2018 with glucose greater than 1600 and K+ greater than 7.5. He suffered a cardiac arrest in January 2020 under similar  circumstances. He was transferred to the [facility] ICU for treatment of his hyperosmolar nonketotic acidosis.   Pt is resting comfortably. No new complaints.  Objective   Vitals:  Vitals:   04/25/18 1500 04/25/18 1540  BP: 116/65   Pulse: 99   Resp: (!) 22   Temp:  98.2 F (36.8 C)  SpO2: 99%     Exam:  Constitutional:  . Awake, alert, and oriented x 3. No acute distress. Respiratory:  . No wheezes, rales, or rhonchi.  . No tactile fremitus. Marland Kitchen Respiratory effort normal. No retractions or accessory muscle use. Cardiovascular:  . Regular rate and rhythm. No murmurs, ectopy, or gallups are appreciated. . No LE extremity edema   . Normal pedal pulses Abdomen:  . Abdomen soft, non-tender, non-distended.  . No hernias, organomegaly, or masses. . Normoactive bowel sounds. Musculoskeletal:  . No cyanosis, clubbing or edema. Skin:  . No rashes, lesions, ulcers . palpation of skin: no induration or nodules Neurologic:  . CN 2-12 intact . Moving all extremities. Psychiatric:  . Mental status o Mood, affect appropriate o Orientation to person, place, time  . judgment and insight appear intact     I have personally reviewed the following:   Today's Data  . Glucoses, Beta hydroxybutyrate, chemistry, vitals  Scheduled Meds: . calcium acetate  2,001 mg Oral TID WC  . Chlorhexidine Gluconate Cloth  6 each Topical Q0600  . [START ON 04/26/2018] Chlorhexidine Gluconate Cloth  6 each Topical Q0600  . cinacalcet  90 mg Oral Q T,Th,Sa-HD  . FLUoxetine  20 mg Oral Daily  . heparin  5,000 Units Subcutaneous Q8H  . insulin aspart  0-5 Units Subcutaneous QHS  .  insulin aspart  0-9 Units Subcutaneous TID WC  . insulin aspart protamine- aspart  25 Units Subcutaneous Once  . insulin detemir  5 Units Subcutaneous BID  . isosorbide-hydrALAZINE  1 tablet Oral TID  . levETIRAcetam  250 mg Oral Q T,Th,Sat-1800  . levETIRAcetam  500 mg Oral Q0600  . minoxidil  5 mg Oral q1800  .  multivitamin  1 tablet Oral QHS  . NIFEdipine  30 mg Oral BID  . polyethylene glycol  17 g Oral Daily  . risperiDONE  1 mg Oral BID  . senna-docusate  1 tablet Oral BID   Continuous Infusions:  Principal Problem:   Depression Active Problems:   Hyperkalemia   Hyperglycemia   MDD (major depressive disorder), recurrent severe, without psychosis ([place])   LOS: 3 days

## 2018-04-25 NOTE — Progress Notes (Signed)
K+- 5.8  1x dose Kayexalate ordered and given.  K+ recheck 1600

## 2018-04-25 NOTE — Progress Notes (Signed)
Text page to DR Karie Kirks to notify that CBG is 560 - coverage for CBG stops at 400 awaiting orders

## 2018-04-25 NOTE — Progress Notes (Signed)
Inpatient Diabetes Program Recommendations  AACE/ADA: New Consensus Statement on Inpatient Glycemic Control (2015)  Target Ranges:  Prepandial:   less than 140 mg/dL      Peak postprandial:   less than 180 mg/dL (1-2 hours)      Critically ill patients:  140 - 180 mg/dL   Lab Results  Component Value Date   GLUCAP 560 (HH) 04/25/2018   HGBA1C 9.1 (H) 02/13/2018    Review of Glycemic Control Results for Edward Colon, Edward Colon (MRN [id number]) as of 04/25/2018 09:09  Ref. Range 04/24/2018 18:34 04/24/2018 19:56 04/24/2018 21:57 04/24/2018 23:42 04/25/2018 03:36 04/25/2018 08:03  Glucose-Capillary Latest Ref Range: 70 - 99 mg/dL 371 (H) 9 units Novolog 383 (H) 416 (H) 7 units Novolog 5 units Levemir 404 (H) 395 (H) 560 (HH)  15 units Novolog   Diabetes history: DM1 Outpatient Diabetes medications: Lantus 14 units daily + Novolog 5 units tid meal coverage + Novolog 0-9 units tid correction Current orders for Inpatient glycemic control:   Inpatient Diabetes Program Recommendations:   Noted patient's CBGs elevated-reviewed CBGs and elevated post no meal coverage & correction missed yesterday afternoon. Add Novolog 3 units tid meal coverage if eats 50% D/C order for 70/30 insulin mix Continue Levemir 5 units bid Spoke with Dr.Swayse to discuss insulin regimen.  Thank you, Nani Gasser. Adianna Darwin, RN, MSN, CDE  Diabetes Coordinator Inpatient Glycemic Control Team Team Pager [phone] (8am-5pm) 04/25/2018 9:33 AM

## 2018-04-25 NOTE — Progress Notes (Signed)
[facility] Progress Note  Subjective: no c/o. Seen by psych, not imminent risk to self or others.   Vitals:   04/25/18 0700 04/25/18 0800 04/25/18 1010 04/25/18 1100  BP: (!) 107/56 (!) 156/78 (!) 109/54 (!) 113/58  Pulse: 93 96  (!) 102  Resp: 16 20  15   Temp:      TempSrc:      SpO2: 97% 97%  99%  Weight:      Height:        Inpatient medications: . calcium acetate  2,001 mg Oral TID WC  . Chlorhexidine Gluconate Cloth  6 each Topical Q0600  . cinacalcet  90 mg Oral Q T,Th,Sa-HD  . FLUoxetine  20 mg Oral Daily  . heparin  5,000 Units Subcutaneous Q8H  . insulin aspart  0-5 Units Subcutaneous QHS  . insulin aspart  0-9 Units Subcutaneous TID WC  . insulin aspart protamine- aspart  25 Units Subcutaneous Once  . insulin detemir  5 Units Subcutaneous BID  . isosorbide-hydrALAZINE  1 tablet Oral TID  . levETIRAcetam  250 mg Oral Q T,Th,Sat-1800  . levETIRAcetam  500 mg Oral Q0600  . minoxidil  5 mg Oral q1800  . multivitamin  1 tablet Oral QHS  . NIFEdipine  30 mg Oral BID  . polyethylene glycol  17 g Oral Daily  . risperiDONE  1 mg Oral BID  . senna-docusate  1 tablet Oral BID    dextrose, docusate sodium  Iron/TIBC/Ferritin/ %Sat    Component Value Date/Time   IRON 23 (L) 03/04/2015 0411   TIBC 192 (L) 03/04/2015 0411   FERRITIN 117 03/04/2015 0411   IRONPCTSAT 12 (L) 03/04/2015 0411    Exam: Gen alert and in no distress, withdrawn today No jvd or bruits Chest clear bilat to bases RRR no MRG Abd soft ntnd no mass or ascites +bs Ext trace LE edema Neuro is alert, Ox 3 , nf LUA AVG +bruit    Home meds:  - nifedipine 30 bid/isosorbide-hydralazine 20-37.5 tid/ minoxidil 5  - risperidone 1mg  bid/ fluoxetine 20 qd  - levetiracetam 500 qd and 250 mg after each HD  - cinacalcet 90 TTS w hd   Dialysis: TTS SW  4h 15min  88.5kg  2/2.25 bath  500/800  LUA AVG  Hep 5000  - mircera 60 every 2 wks  - venofer 50 /wk  - cinacalcet 90 mg po  tiw   Assessment: 1. Severe hyperkalemia - resolved w/ IV insulin and HD 2. ESRD: on HD TTS. HD tomorrow.  3. DM1 - has DKA on admission. AG closed. BS's still high.  4. ESRD on HD TTS.  HD tomorrow.  5. Vol is euvolemic, close to dry wt 6. HTN - on usual BP meds  7. Seizures - on last admit, taking Keppra po  8. Psych - long hx depression , seen by psych no harm to self/ others    [facility] 04/25/2018, 12:01 PM  Recent Labs  Lab 04/22/18 2306  04/24/18 1140 04/25/18 0348  NA 118*   < > 135 129*  K 4.8   < > 4.5 5.8*  CL 72*   < > 94* 92*  CO2 17*   < > 24 26  GLUCOSE 1,577*   < > 320* 423*  BUN 80*   < > 37* 21*  CREATININE 11.80*   < > 9.57* 7.16*  CALCIUM 8.0*   < > 8.2* 7.8*  PHOS 7.3*  --  7.0*  --   ALBUMIN 3.6  --  3.2* 3.3*  INR 1.21  --   --   --    < > = values in this interval not displayed.   Recent Labs  Lab 04/22/18 2306 04/25/18 0348  AST 34 32  ALT 28 21  ALKPHOS 67 60  BILITOT 1.0 1.1  PROT 7.2 6.9   Recent Labs  Lab 04/22/18 1815  04/24/18 1140 04/25/18 0348  WBC 12.4*   < > 5.9 4.8  NEUTROABS 10.7*  --   --   --   HGB 10.3*   < > 9.0* 9.7*  HCT 35.6*   < > 27.2* 29.2*  MCV 96.5   < > 84.0 85.4  PLT 296   < > 244 205   < > = values in this interval not displayed.

## 2018-04-25 NOTE — Progress Notes (Signed)
Text page to DR [facility] to notify of CBG of 650 - awaiting orders

## 2018-04-26 LAB — CBC
HCT: 27.8 % — ABNORMAL LOW (ref 39.0–52.0)
Hemoglobin: 9 g/dL — ABNORMAL LOW (ref 13.0–17.0)
MCH: 27.4 pg (ref 26.0–34.0)
MCHC: 32.4 g/dL (ref 30.0–36.0)
MCV: 84.5 fL (ref 80.0–100.0)
NRBC: 0 % (ref 0.0–0.2)
Platelets: 192 10*3/uL (ref 150–400)
RBC: 3.29 MIL/uL — ABNORMAL LOW (ref 4.22–5.81)
RDW: 13.2 % (ref 11.5–15.5)
WBC: 5.2 10*3/uL (ref 4.0–10.5)

## 2018-04-26 LAB — GLUCOSE, CAPILLARY
Glucose-Capillary: 410 mg/dL — ABNORMAL HIGH (ref 70–99)
Glucose-Capillary: 369 mg/dL — ABNORMAL HIGH (ref 70–99)
Glucose-Capillary: 232 mg/dL — ABNORMAL HIGH (ref 70–99)
Glucose-Capillary: 258 mg/dL — ABNORMAL HIGH (ref 70–99)
Glucose-Capillary: 332 mg/dL — ABNORMAL HIGH (ref 70–99)

## 2018-04-26 LAB — RENAL FUNCTION PANEL
Albumin: 3.4 g/dL — ABNORMAL LOW (ref 3.5–5.0)
Anion gap: 16 — ABNORMAL HIGH (ref 5–15)
BUN: 43 mg/dL — ABNORMAL HIGH (ref 6–20)
CO2: 24 mmol/L (ref 22–32)
Calcium: 8.8 mg/dL — ABNORMAL LOW (ref 8.9–10.3)
Chloride: 92 mmol/L — ABNORMAL LOW (ref 98–111)
Creatinine, Ser: 11.89 mg/dL — ABNORMAL HIGH (ref 0.61–1.24)
GFR calc Af Amer: 6 mL/min — ABNORMAL LOW (ref >60)
GFR, EST NON AFRICAN AMERICAN: 5 mL/min — AB (ref >60)
Glucose, Bld: 151 mg/dL — ABNORMAL HIGH (ref 70–99)
Phosphorus: 2.8 mg/dL (ref 2.5–4.6)
Potassium: 3.7 mmol/L (ref 3.5–5.1)
Sodium: 132 mmol/L — ABNORMAL LOW (ref 135–145)

## 2018-04-26 MED ORDER — INSULIN ASPART 100 UNIT/ML ~~LOC~~ SOLN
16.0000 [IU] | Freq: Once | SUBCUTANEOUS | Status: AC
  Administered 2018-04-26: 16 [IU] via SUBCUTANEOUS

## 2018-04-26 MED ORDER — INSULIN DETEMIR 100 UNIT/ML ~~LOC~~ SOLN
14.0000 [IU] | Freq: Every day | SUBCUTANEOUS | Status: DC
  Administered 2018-04-26 – 2018-04-27 (×2): 14 [IU] via SUBCUTANEOUS
  Filled 2018-04-26 (×2): qty 0.14

## 2018-04-26 MED ORDER — HEPARIN SODIUM (PORCINE) 1000 UNIT/ML IJ SOLN
INTRAMUSCULAR | Status: AC
  Administered 2018-04-26: 5000 [IU] via INTRAVENOUS_CENTRAL
  Filled 2018-04-26: qty 5

## 2018-04-26 MED ORDER — HEPARIN SODIUM (PORCINE) 1000 UNIT/ML DIALYSIS
5000.0000 [IU] | Freq: Once | INTRAMUSCULAR | Status: AC
  Administered 2018-04-26: 5000 [IU] via INTRAVENOUS_CENTRAL

## 2018-04-26 MED ORDER — INSULIN ASPART 100 UNIT/ML ~~LOC~~ SOLN
5.0000 [IU] | Freq: Three times a day (TID) | SUBCUTANEOUS | Status: DC
  Administered 2018-04-26 – 2018-04-27 (×4): 5 [IU] via SUBCUTANEOUS

## 2018-04-26 NOTE — Progress Notes (Signed)
Patients CBG was 410 this morning. MD notified and she has added more insulin coverage. Per MD, do not draw a stat lab glucose.

## 2018-04-26 NOTE — Progress Notes (Signed)
Inpatient Diabetes Program Recommendations  AACE/ADA: New Consensus Statement on Inpatient Glycemic Control (2015)  Target Ranges:  Prepandial:   less than 140 mg/dL      Peak postprandial:   less than 180 mg/dL (1-2 hours)      Critically ill patients:  140 - 180 mg/dL   Lab Results  Component Value Date   GLUCAP 345 (H) 04/25/2018   HGBA1C 9.1 (H) 02/13/2018    Review of Glycemic Control Results for Edward Colon, Edward Colon (MRN [id number]) as of 04/26/2018 07:50  Ref. Range 04/25/2018 08:03 04/25/2018 12:02 04/25/2018 15:42 04/25/2018 22:16  Glucose-Capillary Latest Ref Range: 70 - 99 mg/dL 560 (HH) 403 (H) 122 (H) 345 (H)   Diabetes history: DM1 Outpatient Diabetes medications: Lantus 14 units daily + Novolog 5 units tid meal coverage + Novolog 0-9 units tid correction Current orders for Inpatient glycemic control: Levemir 5 bid + Novolog sensitive correction tid + hs 0-5 units  Inpatient Diabetes Program Recommendations:   Please consider: -Increase Levemir to 7 units bid -Add Novolog 4 units tid meal coverage if eats 50%  Thank you, Bethena Roys E. Kiah Vanalstine, RN, MSN, CDE  Diabetes Coordinator Inpatient Glycemic Control Team Team Pager [phone] (8am-5pm) 04/26/2018 7:52 AM

## 2018-04-26 NOTE — Progress Notes (Signed)
PROGRESS NOTE  Perrin Gens VQM:[id number] DOB: 11-23-1980 DOA: 04/22/2018 PCP: Nolene Ebbs, MD  Brief History   38yo w/ DM1 and multiple admissions for hyperglycemia due to noncompliance who presented to [facility] 04/22/2018 with glucose greater than 1600 and K+ greater than 7.5. He suffered a cardiac arrest in January 2020 under similar circumstances. He was transferred to the [facility] ICU for treatment of his hyperosmolar nonketotic acidosis.   A & P   Recurrent hyperosmolar nonketotic acidosis - Labile DM1 due to noncompliance with medication (had not taken his insulin in over 7 days). Patient presents with glucose > 1600 which rapidly corrected on HD. Pt swings between hypoglycemia and hyperglycemia. Still will hyperglycemia this morning. Insulin has been adjusted. Monitor.  End-stage renal disease: Nephrology following. For HD today.   Pseudo-Hyponatremia: Correcting w/ normalization of glucose. 132 this am with glucose of 151.  Hypertension: Blood pressures well controlled this am. Monitor.   History of ventricular tachycardia: Stable. Monitor on telemetry.  Depression w/ hx of Suicide attempt: Pt has been evaluated by psychiatry. They feel that the patient is not a threat to himself or others at this time.  History of Seizures  Normocytic anemia   DVT prophylaxis: SQ heparin  Code Status: FULL CODE Family Communication: no family present at time of exam  Disposition Plan: keep in current bed due to extreme lability in [place], DO [facility] Direct contact: see [email or web address]  7PM-7AM contact night coverage as above 04/26/2018, 4:14 PM  LOS: 4 days   Consultants  . Nephrology, psychiatry  Interval History/Subjective  38yo w/ DM1 and multiple admissions for hyperglycemia due to noncompliance who presented to [facility] 04/22/2018 with glucose greater than 1600 and K+ greater than 7.5. He suffered a cardiac arrest in January 2020  under similar circumstances. He was transferred to the [facility] ICU for treatment of his hyperosmolar nonketotic acidosis.   Pt is resting comfortably. No new complaints.  Objective   Vitals:  Vitals:   04/26/18 1500 04/26/18 1530  BP: 115/62 (!) 113/59  Pulse: 98 98  Resp:    Temp:    SpO2:      Exam:  Constitutional:  . Awake, alert, and oriented x 3. No acute distress. Respiratory:  . No wheezes, rales, or rhonchi.  . No tactile fremitus. Marland Kitchen Respiratory effort normal. No retractions or accessory muscle use. Cardiovascular:  . Regular rate and rhythm. No murmurs, ectopy, or gallups are appreciated. . No LE extremity edema   . Normal pedal pulses Abdomen:  . Abdomen soft, non-tender, non-distended.  . No hernias, organomegaly, or masses. . Normoactive bowel sounds. Musculoskeletal:  . No cyanosis, clubbing or edema. Skin:  . No rashes, lesions, ulcers . palpation of skin: no induration or nodules Neurologic:  . CN 2-12 intact . Moving all extremities. Psychiatric:  . Mental status o Mood, affect appropriate o Orientation to person, place, time  . judgment and insight appear intact     I have personally reviewed the following:   Today's Data  . Glucoses, Beta hydroxybutyrate, chemistry, vitals  Scheduled Meds: . calcium acetate  2,001 mg Oral TID WC  . Chlorhexidine Gluconate Cloth  6 each Topical Q0600  . Chlorhexidine Gluconate Cloth  6 each Topical Q0600  . cinacalcet  90 mg Oral Q T,Th,Sa-HD  . FLUoxetine  20 mg Oral Daily  . heparin  5,000 Units Subcutaneous Q8H  . insulin aspart  0-5 Units Subcutaneous QHS  .  insulin aspart  0-9 Units Subcutaneous TID WC  . insulin aspart  5 Units Subcutaneous TID WC  . insulin detemir  14 Units Subcutaneous Daily  . isosorbide-hydrALAZINE  1 tablet Oral TID  . levETIRAcetam  250 mg Oral Q T,Th,Sat-1800  . levETIRAcetam  500 mg Oral Q0600  . multivitamin  1 tablet Oral QHS  . NIFEdipine  30 mg Oral BID  .  polyethylene glycol  17 g Oral Daily  . risperiDONE  1 mg Oral BID  . senna-docusate  1 tablet Oral BID   Continuous Infusions:  Principal Problem:   Depression Active Problems:   Hyperkalemia   Hyperglycemia   MDD (major depressive disorder), recurrent severe, without psychosis ([place])   LOS: 4 days

## 2018-04-26 NOTE — Progress Notes (Signed)
Patient came from HD but basically from [place].  Alert and oriented x 4.  Right FA saline lock.  Tele box 5M01, CCMD notified.  Oriented to the room, bed in low position, call bell and phone within reach.  Reviewed the fall safety plan, verbalized understanding.  Will continue to monitor.

## 2018-04-26 NOTE — Progress Notes (Signed)
[facility] Progress Note  Subjective: no c/o, more responsive today  Vitals:   04/26/18 1000 04/26/18 1100 04/26/18 1200 04/26/18 1205  BP: (!) 106/58 (!) 108/52 (!) 96/56   Pulse: (!) 111 (!) 110 (!) 109   Resp: 19 14 16    Temp:    98.7 F (37.1 C)  TempSrc:    Oral  SpO2: 99% 98% 97%   Weight:      Height:        Inpatient medications: . calcium acetate  2,001 mg Oral TID WC  . Chlorhexidine Gluconate Cloth  6 each Topical Q0600  . Chlorhexidine Gluconate Cloth  6 each Topical Q0600  . cinacalcet  90 mg Oral Q T,Th,Sa-HD  . FLUoxetine  20 mg Oral Daily  . heparin  5,000 Units Subcutaneous Q8H  . [START ON 04/27/2018] heparin  5,000 Units Dialysis Once in dialysis  . insulin aspart  0-5 Units Subcutaneous QHS  . insulin aspart  0-9 Units Subcutaneous TID WC  . insulin aspart  5 Units Subcutaneous TID WC  . insulin detemir  14 Units Subcutaneous Daily  . isosorbide-hydrALAZINE  1 tablet Oral TID  . levETIRAcetam  250 mg Oral Q T,Th,Sat-1800  . levETIRAcetam  500 mg Oral Q0600  . minoxidil  5 mg Oral q1800  . multivitamin  1 tablet Oral QHS  . NIFEdipine  30 mg Oral BID  . polyethylene glycol  17 g Oral Daily  . risperiDONE  1 mg Oral BID  . senna-docusate  1 tablet Oral BID    dextrose, docusate sodium  Iron/TIBC/Ferritin/ %Sat    Component Value Date/Time   IRON 23 (L) 03/04/2015 0411   TIBC 192 (L) 03/04/2015 0411   FERRITIN 117 03/04/2015 0411   IRONPCTSAT 12 (L) 03/04/2015 0411    Exam: Gen alert and in no distress, withdrawn today No jvd or bruits Chest clear bilat to bases RRR no MRG Abd soft ntnd no mass or ascites +bs Ext trace LE edema Neuro is alert, Ox 3 , nf LUA AVG +bruit    Home meds:  - nifedipine 30 bid/isosorbide-hydralazine 20-37.5 tid/ minoxidil 5  - risperidone 1mg  bid/ fluoxetine 20 qd  - levetiracetam 500 qd and 250 mg after each HD  - cinacalcet 90 TTS w hd   Dialysis: TTS SW  4h 87min  88.5kg  2/2.25  bath  500/800  LUA AVG  Hep 5000  - mircera 60 every 2 wks  - venofer 50 /wk  - cinacalcet 90 mg po tiw   Assessment: 1. Severe hyperkalemia - resolved w/ IV insulin and HD 2. ESRD: on HD TTS. HD today.  3. DM1 - has DKA on admission. AG closed. BS's still high.  4. ESRD on HD TTS.  HD tomorrow.  5. Vol is euvolemic, close to dry wt 6. HTN - BP's soft, will reduce BP meds by 50% 7. Seizures - on last admit, taking Keppra po  8. Psych - long hx depression , seen by psych no harm to self/ others    [facility] 04/26/2018, 2:20 PM  Recent Labs  Lab 04/22/18 2306  04/24/18 1140 04/25/18 0348 04/25/18 1102 04/25/18 1523  NA 118*   < > 135 129* 130*  --   K 4.8   < > 4.5 5.8* 4.3 3.9  CL 72*   < > 94* 92* 91*  --   CO2 17*   < > 24 26 24   --   GLUCOSE  1,577*   < > 320* 423* 509*  --   BUN 80*   < > 37* 21* 29*  --   CREATININE 11.80*   < > 9.57* 7.16* 8.22*  --   CALCIUM 8.0*   < > 8.2* 7.8* 8.0*  --   PHOS 7.3*  --  7.0*  --   --   --   ALBUMIN 3.6  --  3.2* 3.3*  --   --   INR 1.21  --   --   --   --   --    < > = values in this interval not displayed.   Recent Labs  Lab 04/22/18 2306 04/25/18 0348  AST 34 32  ALT 28 21  ALKPHOS 67 60  BILITOT 1.0 1.1  PROT 7.2 6.9   Recent Labs  Lab 04/22/18 1815  04/25/18 0348 04/26/18 1403  WBC 12.4*   < > 4.8 5.2  NEUTROABS 10.7*  --   --   --   HGB 10.3*   < > 9.7* 9.0*  HCT 35.6*   < > 29.2* 27.8*  MCV 96.5   < > 85.4 84.5  PLT 296   < > 205 192   < > = values in this interval not displayed.

## 2018-04-27 DIAGNOSIS — Z992 Dependence on renal dialysis: Secondary | ICD-10-CM

## 2018-04-27 LAB — BLOOD GAS, ARTERIAL
Acid-Base Excess: 1.9 mmol/L (ref 0.0–2.0)
Bicarbonate: 26 mmol/L (ref 20.0–28.0)
Drawn by: 247191
FIO2: 21
O2 Saturation: 97.3 %
Patient temperature: 98.6
pCO2 arterial: 40.8 mmHg (ref 32.0–48.0)
pH, Arterial: 7.421 (ref 7.350–7.450)
pO2, Arterial: 87.6 mmHg (ref 83.0–108.0)

## 2018-04-27 LAB — CBC WITH DIFFERENTIAL/PLATELET
Abs Immature Granulocytes: 0.03 10*3/uL (ref 0.00–0.07)
Basophils Absolute: 0 10*3/uL (ref 0.0–0.1)
Basophils Relative: 0 %
EOS PCT: 1 %
Eosinophils Absolute: 0.1 10*3/uL (ref 0.0–0.5)
HEMATOCRIT: 26.4 % — AB (ref 39.0–52.0)
Hemoglobin: 8.7 g/dL — ABNORMAL LOW (ref 13.0–17.0)
Immature Granulocytes: 1 %
LYMPHS PCT: 29 %
Lymphs Abs: 1.4 10*3/uL (ref 0.7–4.0)
MCH: 27.9 pg (ref 26.0–34.0)
MCHC: 33 g/dL (ref 30.0–36.0)
MCV: 84.6 fL (ref 80.0–100.0)
Monocytes Absolute: 0.5 10*3/uL (ref 0.1–1.0)
Monocytes Relative: 11 %
Neutro Abs: 2.8 10*3/uL (ref 1.7–7.7)
Neutrophils Relative %: 58 %
Platelets: 188 10*3/uL (ref 150–400)
RBC: 3.12 MIL/uL — ABNORMAL LOW (ref 4.22–5.81)
RDW: 13.2 % (ref 11.5–15.5)
WBC: 4.9 10*3/uL (ref 4.0–10.5)
nRBC: 0 % (ref 0.0–0.2)

## 2018-04-27 LAB — GLUCOSE, CAPILLARY
GLUCOSE-CAPILLARY: 508 mg/dL — AB (ref 70–99)
GLUCOSE-CAPILLARY: 57 mg/dL — AB (ref 70–99)
GLUCOSE-CAPILLARY: 145 mg/dL — AB (ref 70–99)
Glucose-Capillary: 122 mg/dL — ABNORMAL HIGH (ref 70–99)
Glucose-Capillary: 301 mg/dL — ABNORMAL HIGH (ref 70–99)
Glucose-Capillary: 345 mg/dL — ABNORMAL HIGH (ref 70–99)
Glucose-Capillary: 356 mg/dL — ABNORMAL HIGH (ref 70–99)
Glucose-Capillary: 502 mg/dL (ref 70–99)
Glucose-Capillary: 61 mg/dL — ABNORMAL LOW (ref 70–99)

## 2018-04-27 LAB — BASIC METABOLIC PANEL
ANION GAP: 12 (ref 5–15)
BUN: 26 mg/dL — ABNORMAL HIGH (ref 6–20)
CO2: 23 mmol/L (ref 22–32)
Calcium: 8 mg/dL — ABNORMAL LOW (ref 8.9–10.3)
Chloride: 93 mmol/L — ABNORMAL LOW (ref 98–111)
Creatinine, Ser: 8.16 mg/dL — ABNORMAL HIGH (ref 0.61–1.24)
GFR calc Af Amer: 9 mL/min — ABNORMAL LOW (ref >60)
GFR calc non Af Amer: 8 mL/min — ABNORMAL LOW (ref >60)
Glucose, Bld: 412 mg/dL — ABNORMAL HIGH (ref 70–99)
Potassium: 4.4 mmol/L (ref 3.5–5.1)
Sodium: 128 mmol/L — ABNORMAL LOW (ref 135–145)

## 2018-04-27 MED ORDER — NALOXONE HCL 0.4 MG/ML IJ SOLN: 0.4000 mg | INTRAMUSCULAR | Status: DC | PRN

## 2018-04-27 MED ORDER — NALOXONE HCL 0.4 MG/ML IJ SOLN
INTRAMUSCULAR | Status: AC
  Administered 2018-04-27: 0.4 mg
  Filled 2018-04-27: qty 1

## 2018-04-27 MED ORDER — CALCIUM ACETATE (PHOS BINDER) 667 MG PO CAPS
1334.0000 mg | ORAL_CAPSULE | Freq: Three times a day (TID) | ORAL | Status: DC
  Administered 2018-04-27 (×2): 1334 mg via ORAL
  Filled 2018-04-27 (×2): qty 2

## 2018-04-27 MED ORDER — INSULIN ASPART 100 UNIT/ML ~~LOC~~ SOLN
10.0000 [IU] | Freq: Once | SUBCUTANEOUS | Status: AC
  Administered 2018-04-27: 10 [IU] via SUBCUTANEOUS

## 2018-04-27 MED ORDER — INSULIN GLARGINE 100 UNIT/ML ~~LOC~~ SOLN
8.0000 [IU] | Freq: Two times a day (BID) | SUBCUTANEOUS | Status: DC
  Administered 2018-04-28: 8 [IU] via SUBCUTANEOUS
  Filled 2018-04-27: qty 0.08

## 2018-04-27 MED ORDER — CINACALCET HCL 30 MG PO TABS: 60.0000 mg | ORAL_TABLET | ORAL | Status: DC

## 2018-04-27 NOTE — Progress Notes (Signed)
Pt was not responsive,difficult to arouse, lethargic going in and out. CBG=508. Hospital Lamar Blinks, NP was notified and ordered STATt narcan 0.4mg IVP, STAT BMet, and ABG. K. Schorr said to call her when glucose result come back. I page text K. Schorr once and called her 3 times no response.
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
## 2018-04-27 NOTE — Progress Notes (Signed)
Inpatient Diabetes Program Recommendations  AACE/ADA: New Consensus Statement on Inpatient Glycemic Control (2015)  Target Ranges:  Prepandial:   less than 140 mg/dL      Peak postprandial:   less than 180 mg/dL (1-2 hours)      Critically ill patients:  140 - 180 mg/dL   Lab Results  Component Value Date   GLUCAP 356 (H) 04/27/2018   HGBA1C 9.1 (H) 02/13/2018    Update: Spoke to patient at length about his diabetes. Didn't get clear answers to many of my questions initally but after discussing/asking in different ways I was able to elicit the following information:  Home DM meds: Per patient his insurance (listed as Medicare and Medicaid) no longer covers Novolog and he needs Humalog ordered. I called [facility] on [address] that patient uses and they verified that Humalog is the formulary insulin for this patient. Patient states he doesn't have problem getting Lantus filled. When I asked pharmacy tech at [facility] about coverage for basal insulin she said she was unable to check without a prescription.   States at home he takes:    1. Lantus 25 units daily (though documented in the chart 14 units daily). He verified multiple times that he takes 25 units of Lantus daily.   2. Novolog 3- 4 units meal coverage tid. When asked about Novolog correction scale he stated he did add correction to his meal coverage if he were "high" which he considered over 300. He stated that he would add Novolog 5 units if he were 300-400mg /dl and 6-7 units if he were 400-500mg /dl.     States he does have a glucometer and gets refills of lancets/strips OTC at [facility]. At first he told me he checked every few days unless his blood glucose "got weird" then he would check every 30 minutes. After asking later in the conversation, he said he usually checked 4x/day.  He has lows down to 20 and high up to 400 at times. He wasn't able to give me an average CBG as he said he has lots of highs and lows. Discussed hypoglycemia and  how to treat.   Emphasized the importance of having a PCP (ideally an endocrinologist) to manage his labile diabetes. He agreed and said he has been to many different doctors and most recently the [facility] (Dr. Jeanie Cooks). He stated he was done going to him and needed to find a new MD. Otho Bellows, Case Manager, to see if she had any leads for patient with Medicare/Medicaid. She states [facility]) is a new office taking patients with Medicare/Medicaid and will follow up on this.    MD - at discharge home please order Humalog and Lantus.   Thank you.   -- Will follow during hospitalization.--  Jonna Clark RN, MSN Diabetes Coordinator Inpatient Glycemic Control Team Team Pager: [phone] (8am-5pm)

## 2018-04-27 NOTE — Plan of Care (Signed)
  Problem: Health Behavior/Discharge Planning: Goal: Ability to manage health-related needs will improve Outcome: Progressing   Problem: Clinical Measurements: Goal: Ability to maintain clinical measurements within normal limits will improve Outcome: Progressing   Problem: Clinical Measurements: Goal: Cardiovascular complication will be avoided Outcome: Progressing   

## 2018-04-27 NOTE — Care Management Note (Signed)
Case Management Note  Patient Details  Name: Edward Colon MRN: [id number] Date of Birth: [birth year]  Subjective/Objective:                    Action/Plan: Case manager spoke with patient and Diabetes Coordinator concerning getting patient established with a medical home. Patient used to go to [facility] but doesn't plan to return there. CM has secured an appointment for patient on Friday, May 18, 2018 at 2:00pm at the [facility], [address] in [place], [place]. CM has provided this information to patient and placed it on AVS.   Expected Discharge Date:   pending               Expected Discharge Plan:  Home/Self Care  In-House Referral:  NA  Discharge planning Services  CM Consult, Follow-up appt scheduled  Post Acute Care Choice:  NA Choice offered to:  Patient  DME Arranged:  N/A DME Agency:  NA  HH Arranged:  NA HH Agency:  NA  Status of Service:  Completed, signed off  If discussed at [place] of Stay Meetings, dates discussed:    Additional Comments:  Ninfa Meeker, RN 04/27/2018, 3:28 PM

## 2018-04-27 NOTE — Progress Notes (Signed)
Inpatient Diabetes Program Recommendations  AACE/ADA: New Consensus Statement on Inpatient Glycemic Control (2015)  Target Ranges:  Prepandial:   less than 140 mg/dL      Peak postprandial:   less than 180 mg/dL (1-2 hours)      Critically ill patients:  140 - 180 mg/dL   Lab Results  Component Value Date   GLUCAP 356 (H) 04/27/2018   HGBA1C 9.1 (H) 02/13/2018     Results for Edward Colon, Edward Colon (MRN [id number]) as of 04/27/2018 13:21  Ref. Range 04/26/2018 12:11 04/26/2018 18:24 04/26/2018 18:49 04/26/2018 20:31 04/27/2018 00:11 04/27/2018 02:43 04/27/2018 04:11 04/27/2018 07:17 04/27/2018 11:22  Glucose-Capillary Latest Ref Range: 70 - 99 mg/dL 369 (H) 232 (H) 258 (H) 332 (H) 345 (H) 502 (HH) 508 (HH) 301 (H) 356 (H)      Review of Glycemic Control  Diabetes history: DM Type 1  Outpatient Diabetes medications: Lantus 14 units daily + Novolog 5 units tid meal coverage + Novolog 0-9 units tid correction  Current orders for Inpatient glycemic control: Levemir 14 units daily                                                                          Novolog meal coverage 5 units tid wc                                                                          Novolog (0-9 units) sensitive correction scale tid and (0-5 units) hs     Note CBG increased to 508 during the night. Recommend increasing the Levemir a small amount and splitting the dose to BID.  As patient has received daily Levemir dose today this should start tomorrow (2/22).     MD please consider following inpatient glycemic control recommendations:  Starting tomorrow (2/22) , increase Levemir to 8 units BID.     Thank you.  -- Will follow during hospitalization.--  Jonna Clark RN, MSN Diabetes Coordinator Inpatient Glycemic Control Team Team Pager: [phone] (8am-5pm)

## 2018-04-28 ENCOUNTER — Other Ambulatory Visit: Payer: Self-pay

## 2018-04-28 ENCOUNTER — Encounter (HOSPITAL_COMMUNITY): Payer: Self-pay

## 2018-04-28 DIAGNOSIS — R739 Hyperglycemia, unspecified: Secondary | ICD-10-CM

## 2018-04-28 DIAGNOSIS — E875 Hyperkalemia: Secondary | ICD-10-CM

## 2018-04-28 DIAGNOSIS — E1310 Other specified diabetes mellitus with ketoacidosis without coma: Secondary | ICD-10-CM

## 2018-04-28 DIAGNOSIS — G9340 Encephalopathy, unspecified: Secondary | ICD-10-CM

## 2018-04-28 LAB — CBC
HCT: 27.6 % — ABNORMAL LOW (ref 39.0–52.0)
Hemoglobin: 9.1 g/dL — ABNORMAL LOW (ref 13.0–17.0)
MCH: 27.8 pg (ref 26.0–34.0)
MCHC: 33 g/dL (ref 30.0–36.0)
MCV: 84.4 fL (ref 80.0–100.0)
Platelets: 207 K/uL (ref 150–400)
RBC: 3.27 MIL/uL — ABNORMAL LOW (ref 4.22–5.81)
RDW: 13.3 % (ref 11.5–15.5)
WBC: 5 K/uL (ref 4.0–10.5)
nRBC: 0 % (ref 0.0–0.2)

## 2018-04-28 LAB — GLUCOSE, CAPILLARY
GLUCOSE-CAPILLARY: 431 mg/dL — AB (ref 70–99)
Glucose-Capillary: 103 mg/dL — ABNORMAL HIGH (ref 70–99)
Glucose-Capillary: 116 mg/dL — ABNORMAL HIGH (ref 70–99)
Glucose-Capillary: 132 mg/dL — ABNORMAL HIGH (ref 70–99)
Glucose-Capillary: 181 mg/dL — ABNORMAL HIGH (ref 70–99)
Glucose-Capillary: 228 mg/dL — ABNORMAL HIGH (ref 70–99)
Glucose-Capillary: 231 mg/dL — ABNORMAL HIGH (ref 70–99)
Glucose-Capillary: 399 mg/dL — ABNORMAL HIGH (ref 70–99)
Glucose-Capillary: 472 mg/dL — ABNORMAL HIGH (ref 70–99)
Glucose-Capillary: 554 mg/dL (ref 70–99)
Glucose-Capillary: 83 mg/dL (ref 70–99)
Glucose-Capillary: >600 mg/dL (ref 70–99)
Glucose-Capillary: >600 mg/dL (ref 70–99)

## 2018-04-28 LAB — RENAL FUNCTION PANEL
Albumin: 3.3 g/dL — ABNORMAL LOW (ref 3.5–5.0)
Anion gap: 16 — ABNORMAL HIGH (ref 5–15)
BUN: 49 mg/dL — ABNORMAL HIGH (ref 6–20)
CO2: 22 mmol/L (ref 22–32)
Calcium: 8.3 mg/dL — ABNORMAL LOW (ref 8.9–10.3)
Chloride: 92 mmol/L — ABNORMAL LOW (ref 98–111)
Creatinine, Ser: 11.69 mg/dL — ABNORMAL HIGH (ref 0.61–1.24)
GFR calc Af Amer: 6 mL/min — ABNORMAL LOW
GFR calc non Af Amer: 5 mL/min — ABNORMAL LOW
Glucose, Bld: 563 mg/dL (ref 70–99)
Phosphorus: 2.4 mg/dL — ABNORMAL LOW (ref 2.5–4.6)
Potassium: 4.3 mmol/L (ref 3.5–5.1)
Sodium: 130 mmol/L — ABNORMAL LOW (ref 135–145)

## 2018-04-28 LAB — BLOOD GAS, ARTERIAL
Acid-Base Excess: 0.4 mmol/L (ref 0.0–2.0)
Bicarbonate: 24.9 mmol/L (ref 20.0–28.0)
Drawn by: 283381
FIO2: 21
O2 Saturation: 95.1 %
PH ART: 7.381 (ref 7.350–7.450)
Patient temperature: 98
pCO2 arterial: 42.8 mmHg (ref 32.0–48.0)
pO2, Arterial: 72.5 mmHg — ABNORMAL LOW (ref 83.0–108.0)

## 2018-04-28 LAB — BASIC METABOLIC PANEL
Anion gap: 15 (ref 5–15)
BUN: 45 mg/dL — ABNORMAL HIGH (ref 6–20)
CO2: 20 mmol/L — ABNORMAL LOW (ref 22–32)
CREATININE: 11.38 mg/dL — AB (ref 0.61–1.24)
Calcium: 7.7 mg/dL — ABNORMAL LOW (ref 8.9–10.3)
Chloride: 90 mmol/L — ABNORMAL LOW (ref 98–111)
GFR calc Af Amer: 6 mL/min — ABNORMAL LOW (ref >60)
GFR calc non Af Amer: 5 mL/min — ABNORMAL LOW (ref >60)
Glucose, Bld: 821 mg/dL (ref 70–99)
Potassium: 7.3 mmol/L (ref 3.5–5.1)
Sodium: 125 mmol/L — ABNORMAL LOW (ref 135–145)

## 2018-04-28 LAB — AMMONIA: Ammonia: 26 umol/L (ref 9–35)

## 2018-04-28 MED ORDER — INSULIN ASPART 100 UNIT/ML ~~LOC~~ SOLN
0.0000 [IU] | SUBCUTANEOUS | Status: DC
  Administered 2018-04-28: 20 [IU] via SUBCUTANEOUS

## 2018-04-28 MED ORDER — PENTAFLUOROPROP-TETRAFLUOROETH EX AERO: 1.0000 "application " | INHALATION_SPRAY | CUTANEOUS | Status: DC | PRN

## 2018-04-28 MED ORDER — LIDOCAINE-PRILOCAINE 2.5-2.5 % EX CREA
1.0000 "application " | TOPICAL_CREAM | CUTANEOUS | Status: DC | PRN
  Filled 2018-04-28: qty 5

## 2018-04-28 MED ORDER — LEVETIRACETAM 500 MG PO TABS
500.0000 mg | ORAL_TABLET | Freq: Every day | ORAL | Status: DC
  Administered 2018-04-29 – 2018-05-03 (×5): 500 mg via ORAL
  Filled 2018-04-28 (×5): qty 1

## 2018-04-28 MED ORDER — DEXTROSE 50 % IV SOLN
25.0000 mL | INTRAVENOUS | Status: DC | PRN
  Administered 2018-04-29 – 2018-05-02 (×2): 25 mL via INTRAVENOUS
  Filled 2018-04-28 (×2): qty 50

## 2018-04-28 MED ORDER — INSULIN ASPART 100 UNIT/ML ~~LOC~~ SOLN: 5.0000 [IU] | Freq: Three times a day (TID) | SUBCUTANEOUS | Status: DC

## 2018-04-28 MED ORDER — INSULIN REGULAR BOLUS VIA INFUSION
0.0000 [IU] | Freq: Three times a day (TID) | INTRAVENOUS | Status: DC
  Filled 2018-04-28: qty 10

## 2018-04-28 MED ORDER — SODIUM BICARBONATE 8.4 % IV SOLN
100.0000 meq | Freq: Once | INTRAVENOUS | Status: AC
  Administered 2018-04-28: 100 meq via INTRAVENOUS
  Filled 2018-04-28: qty 100
  Filled 2018-04-28: qty 50

## 2018-04-28 MED ORDER — INSULIN ASPART 100 UNIT/ML ~~LOC~~ SOLN
15.0000 [IU] | Freq: Once | SUBCUTANEOUS | Status: AC
  Administered 2018-04-28: 15 [IU] via SUBCUTANEOUS

## 2018-04-28 MED ORDER — LEVETIRACETAM IN NACL 500 MG/100ML IV SOLN
500.0000 mg | Freq: Every day | INTRAVENOUS | Status: DC
  Administered 2018-04-28: 500 mg via INTRAVENOUS
  Filled 2018-04-28 (×2): qty 100

## 2018-04-28 MED ORDER — DEXTROSE-NACL 5-0.45 % IV SOLN
INTRAVENOUS | Status: DC
  Administered 2018-04-28: 16:00:00 via INTRAVENOUS

## 2018-04-28 MED ORDER — CALCIUM GLUCONATE 10 % IV SOLN
1.0000 g | Freq: Once | INTRAVENOUS | Status: AC
  Administered 2018-04-28: 1 g via INTRAVENOUS
  Filled 2018-04-28 (×2): qty 10

## 2018-04-28 MED ORDER — INSULIN ASPART 100 UNIT/ML ~~LOC~~ SOLN: 0.0000 [IU] | Freq: Three times a day (TID) | SUBCUTANEOUS | Status: DC

## 2018-04-28 MED ORDER — SODIUM CHLORIDE 0.9 % IV SOLN: INTRAVENOUS | Status: DC

## 2018-04-28 MED ORDER — SODIUM CHLORIDE 0.9 % IV SOLN
250.0000 mg | INTRAVENOUS | Status: DC
  Filled 2018-04-28: qty 2.5

## 2018-04-28 MED ORDER — ALBUTEROL SULFATE (2.5 MG/3ML) 0.083% IN NEBU
10.0000 mg | INHALATION_SOLUTION | Freq: Once | RESPIRATORY_TRACT | Status: AC
  Administered 2018-04-28: 10 mg via RESPIRATORY_TRACT
  Filled 2018-04-28: qty 12

## 2018-04-28 MED ORDER — HEPARIN SODIUM (PORCINE) 1000 UNIT/ML IJ SOLN
INTRAMUSCULAR | Status: AC
  Administered 2018-04-28: 5000 [IU] via INTRAVENOUS_CENTRAL
  Filled 2018-04-28: qty 5

## 2018-04-28 MED ORDER — HEPARIN SODIUM (PORCINE) 1000 UNIT/ML IJ SOLN
INTRAMUSCULAR | Status: AC
  Filled 2018-04-28: qty 4

## 2018-04-28 MED ORDER — SODIUM CHLORIDE 0.9 % IV SOLN: 100.0000 mL | INTRAVENOUS | Status: DC | PRN

## 2018-04-28 MED ORDER — LEVETIRACETAM 250 MG PO TABS
250.0000 mg | ORAL_TABLET | ORAL | Status: DC
  Administered 2018-04-28 – 2018-05-01 (×2): 250 mg via ORAL
  Filled 2018-04-28 (×2): qty 1

## 2018-04-28 MED ORDER — HEPARIN SODIUM (PORCINE) 1000 UNIT/ML DIALYSIS
5000.0000 [IU] | INTRAMUSCULAR | Status: DC | PRN
  Administered 2018-04-28: 5000 [IU] via INTRAVENOUS_CENTRAL
  Filled 2018-04-28 (×2): qty 5

## 2018-04-28 MED ORDER — INSULIN ASPART 100 UNIT/ML ~~LOC~~ SOLN
5.0000 [IU] | SUBCUTANEOUS | Status: DC
  Administered 2018-04-28 – 2018-04-29 (×2): 5 [IU] via SUBCUTANEOUS

## 2018-04-28 MED ORDER — HYDRALAZINE HCL 20 MG/ML IJ SOLN: 10.0000 mg | Freq: Four times a day (QID) | INTRAMUSCULAR | Status: DC | PRN

## 2018-04-28 MED ORDER — INSULIN REGULAR(HUMAN) IN NACL 100-0.9 UT/100ML-% IV SOLN
INTRAVENOUS | Status: DC
  Administered 2018-04-28: 4.7 [IU]/h via INTRAVENOUS
  Filled 2018-04-28: qty 100

## 2018-04-28 MED ORDER — LIDOCAINE HCL (PF) 1 % IJ SOLN: 5.0000 mL | INTRAMUSCULAR | Status: DC | PRN

## 2018-04-28 MED ORDER — INSULIN GLARGINE 100 UNIT/ML ~~LOC~~ SOLN
14.0000 [IU] | Freq: Every day | SUBCUTANEOUS | Status: DC
  Administered 2018-04-28 – 2018-04-30 (×3): 14 [IU] via SUBCUTANEOUS
  Filled 2018-04-28 (×4): qty 0.14

## 2018-04-28 NOTE — Progress Notes (Signed)
PROGRESS NOTE   Edward Colon  FEO:[id number]    DOB: 1981/03/07    DOA: 04/22/2018  PCP: Nolene Ebbs, MD   I have briefly reviewed patients previous medical records in [facility].  Brief Narrative:  38 year old male, lives with his parents, independent, PMH of type I DM since age 62 years with peripheral neuropathy, brittle DM, issues with noncompliance and prior episodes of DKA, ESRD on TTS HD, HTN, seizures, anemia, cardiac arrest January 2019, initially presented to [facility] ED on 04/22/2018 due to running out of insulins for several days prior and with epigastric pain, nausea, vomiting, URI symptoms.  In the ED, tachycardic in the 130s, BP in the 190s and lab work showed venous pH of 7.12, BMP: Sodium 113, potassium >7.5, glucose 1649, BUN 78, creatinine 11.29, anion gap 28.  He was diagnosed with DKA and hypertensive emergency, started on IV insulin, given IV calcium, Kayexalate, transferred to [facility] ICU under CCM service for urgent dialysis.  After he was stabilized, patient was transferred to [facility] on 04/24/2018.  Hyperkalemia resolved after dialysis, hyperglycemia has improved but still persistently high CBGs warranting close monitoring and adjustment of insulins in the hospital.  Nephrology consulting.  04/28/18: overnight events noted. Had hypoglycemic episodes with CBG 50-60 yesterday afternoon and CBG's now up> 600 since this am. Patient examined at bedside, extremely drowsy, barely opens eyes but currently protecting airway and stable vitals. BMP reviewed> glu>800, K 7.3 with EKG changes (peaked T waves), HCO3: 20, AG 15. Back with HONK, hyperkalemia with EKG changes, Acute TME. Discussed with Dr. Jonnie Finner, Hyperkalemia protocol initiated, HD asap> wouldn't not be able to go at least for next hour or so. NPO and hold all PO meds until consistently alert. Change Keppra to IV. IV insulin per glucose stabilizer started. Transfer back to ICU for close monitoring and  management. Discussed with Dr. Pia Mau, [place]. RN checked room and belongings and no substances found that he may have used.   Assessment & Plan:   Principal Problem:   Depression Active Problems:   Hyperkalemia   Hyperglycemia   MDD (major depressive disorder), recurrent severe, without psychosis (HCC)   Type I DM, not at goal, presented with DKA: In the context of noncompliance, had run out of insulin several days PTA.  Does not follow with PCP or endocrinology.  Although he claims compliance with diabetic diet, regular CBG monitoring at home and compliance to insulins when he has it, not sure if this history is reliable.  A1c 9.1 on 02/13/2018.  Blood sugar on admission 1649 and other labs as noted above.  Initially treated with IV insulin drip but not aggressive IV fluid resuscitation due to ESRD and concern for volume overload.  After DKA/hyperosmolar resolved, transitioned to maintenance Levemir, mealtime and sliding scale NovoLog insulin.  Diabetes coordinator input appreciated, discussed with her.  Need to verify home dose of Lantus (14 units daily documented in med list but patient states he takes 25 units daily).  Currently on Levemir 14 units daily, NovoLog 5 units 3 times daily with meals and SSI with bedtime scale.  CBGs ranging in 300s.  DM coordinator recommends changing Levemir to 8 units twice daily beginning 2/22 (this may change if home doses reported as higher).  Need to verify if Levemir is covered by his insurance.  Hesitant to make large changes in maintenance insulin due to history of CBGs dropping too low.  Extensively counseled regarding importance of compliance with all aspects of DM  care and he verbalized understanding.  CM consultation for PCP follow-up.  Deteriorated on 2/22> see above. No IVF d/t ESRD HD patient.  Severe hyperkalemia with EKG changes: Secondary to missed HD.  In the ED received albuterol, calcium, Kayexalate, IV insulin infusion.  Subsequently transferred  to [facility] and has undergone dialysis with resolution.  Back with hyperkalemia> see above.  Acute Toxic Metabolic Encephalopathy Mostly d/t HONK and possible contribution for Uremia, Meds (Risperdal and Prozac). Mx as above. As discussed with Renal, historically when patient develops MS changes it takes a couple of days for him to completely recover from same.  ESRD on TTS HD: Nephrology consultation and follow-up appreciated.  Undergoing HD as per their guidance.  Next HD on 2/22> needs to be done asap> Renal aware and on board..  Essential hypertension: As per nephrology, blood pressures were soft and hence antihypertensives titrated down.  Currently on BiDil 1 tablet 3 times daily, nifedipine 30 mg twice daily. PO meds held and PRN IV Hydralazine.  Seizure disorder: Continue Keppra, change to IV.  Pseudohyponatremia: Sodium 125 prior to correction.  Blood glucose 800+.  Corrected sodium should be normal.  Anemia in ESRD: Stable.  ESA at HD in a.m.  Secondary hyperparathyroidism: Management per nephrology.  Depression: Denies suicidal or homicidal ideations.  Seen by psychiatry on 2/18.  He declined medication adjustments at this time.  Willing to follow-up with outpatient psychiatry.  Social work to provide outpatient mental health resources.  Continue Prozac 20 mg daily for mood, continue Risperdal 1 mg twice daily for mood stabilization/psychosis.  Monitor EKG periodically for QT prolongation.  Initial EKG on 04/22/2018 showed QTC of 468 ms but had BBB morphology and subsequent EKGs had QTC in the 600s.  Repeat EKG today with QTC 444 msecs. Risperdal and Prozac held for now d/t MS changes.  Remains on monitor.  History of cardiac arrest/?  VT: Reportedly in January 2019 due to hyperkalemia.  DVT prophylaxis: Subcutaneous heparin Code Status: Full Family Communication: None at bedside Disposition: Transferred to ICU 2/22   Consultants:  CCM-signed off. Reconsulted CCM on  2/22 Nephrology  Procedures:  HD  Antimicrobials:  None   Subjective: overnight events noted. Had hypoglycemic episodes with CBG 50-60 yesterday afternoon and CBG's now up> 600 since this am. Patient examined at bedside, extremely drowsy, barely opens eyes but currently protecting airway. Unable to provide any history.  ROS: As above, otherwise negative  Objective:  Vitals:   04/27/18 1800 04/27/18 1944 04/28/18 0441 04/28/18 0700  BP: (!) 152/83 136/75 (!) 147/79   Pulse: 98 (!) 102 91   Resp: 18 20 20    Temp: 98.1 F (36.7 C) 98.2 F (36.8 C) 98.1 F (36.7 C)   TempSrc: Oral Oral Oral   SpO2: 98% 99% 99% 99%  Weight:  90.1 kg    Height:        Examination:  General exam: Pleasant young male, moderately built and nourished lying comfortably propped up in bed.  Oral mucosa borderline hydration Respiratory system: Clear to auscultation. Respiratory effort normal. Cardiovascular system: S1 & S2 heard, RRR. No JVD, murmurs, rubs, gallops or clicks. No pedal edema.  Telemetry personally reviewed: SR. Gastrointestinal system: Abdomen is nondistended, soft and nontender. No organomegaly or masses felt. Normal bowel sounds heard. Central nervous system: extremely drowsy, barely opens eyes but currently protecting airway . No focal neurological deficits. Extremities: Moves all extremities spontaneously. Skin: No rashes, lesions or ulcers Psychiatry: Judgement and insight appear normal. Mood &  affect appropriate.     Data Reviewed: I have personally reviewed following labs and imaging studies  CBC: Recent Labs  Lab 04/22/18 1815  04/23/18 1311 04/24/18 1140 04/25/18 0348 04/26/18 1403 04/27/18 0458  WBC 12.4*   < > 10.4 5.9 4.8 5.2 4.9  NEUTROABS 10.7*  --   --   --   --   --  2.8  HGB 10.3*   < > 8.8* 9.0* 9.7* 9.0* 8.7*  HCT 35.6*   < > 25.2* 27.2* 29.2* 27.8* 26.4*  MCV 96.5   < > 80.5 84.0 85.4 84.5 84.6  PLT 296   < > 248 244 205 192 188   < > = values in  this interval not displayed.   Basic Metabolic Panel: Recent Labs  Lab 04/22/18 2306  04/24/18 1140 04/25/18 0348 04/25/18 1102 04/25/18 1523 04/26/18 1404 04/27/18 0458 04/28/18 0646  NA 118*   < > 135 129* 130*  --  132* 128* 125*  K 4.8   < > 4.5 5.8* 4.3 3.9 3.7 4.4 7.3*  CL 72*   < > 94* 92* 91*  --  92* 93* 90*  CO2 17*   < > 24 26 24   --  24 23 20*  GLUCOSE 1,577*   < > 320* 423* 509*  --  151* 412* 821*  BUN 80*   < > 37* 21* 29*  --  43* 26* 45*  CREATININE 11.80*   < > 9.57* 7.16* 8.22*  --  11.89* 8.16* 11.38*  CALCIUM 8.0*   < > 8.2* 7.8* 8.0*  --  8.8* 8.0* 7.7*  MG 2.3  --   --   --   --   --   --   --   --   PHOS 7.3*  --  7.0*  --   --   --  2.8  --   --    < > = values in this interval not displayed.   Liver Function Tests: Recent Labs  Lab 04/22/18 2306 04/24/18 1140 04/25/18 0348 04/26/18 1404  AST 34  --  32  --   ALT 28  --  21  --   ALKPHOS 67  --  60  --   BILITOT 1.0  --  1.1  --   PROT 7.2  --  6.9  --   ALBUMIN 3.6 3.2* 3.3* 3.4*   Coagulation Profile: Recent Labs  Lab 04/22/18 2306  INR 1.21   CBG: Recent Labs  Lab 04/27/18 2357 04/28/18 0609 04/28/18 0612 04/28/18 0659 04/28/18 0719  GLUCAP 181* >600* >600* >600* >600*    Recent Results (from the past 240 hour(s))  MRSA PCR Screening     Status: None   Collection Time: 04/22/18 11:04 PM  Result Value Ref Range Status   MRSA by PCR NEGATIVE NEGATIVE Final    Comment:        The GeneXpert MRSA Assay (FDA approved for NASAL specimens only), is one component of a comprehensive MRSA colonization surveillance program. It is not intended to diagnose MRSA infection nor to guide or monitor treatment for MRSA infections. Performed at [facility], [place] [address] [place], [place] [address]          Radiology Studies: No results found.      Scheduled Meds: . Chlorhexidine Gluconate Cloth  6 each Topical Q0600  . Chlorhexidine Gluconate Cloth  6 each Topical  Q0600  . heparin      .  heparin  5,000 Units Subcutaneous Q8H  . insulin glargine  8 Units Subcutaneous BID  . insulin regular  0-10 Units Intravenous TID WC   Continuous Infusions: . sodium chloride    . dextrose 5 % and 0.45% NaCl    . insulin       LOS: 6 days     Vernell Leep, MD, FACP, [facility]. [facility]  To contact the attending provider between 7A-7P or the covering provider during after hours 7P-7A, please log into the web site [email or web address] and access using universal [place] password for that web site. If you do not have the password, please call the hospital operator.  04/28/2018, 9:07 AM

## 2018-04-28 NOTE — Plan of Care (Signed)
Lab resultss are getting better

## 2018-04-28 NOTE — Progress Notes (Addendum)
NAME:  Edward Colon, MRN:  [id number], DOB:  01-19-1981, LOS: 6 ADMISSION DATE:  04/22/2018,  REFERRING MD: Urgency department physician, CHIEF COMPLAINT: Hypercalcemia, hyperkalemia, hypertension in the setting of end-stage renal disease  Brief History   38 year old ESRD, insulin-dependent brittle diabetic noncompliant who presents with hyperglycemia > 1600, hyperkalemia and hypertension, s/p iHD 2/17 am   Past Medical History  End-stage renal disease,Cardiac arrest 03/2018, Type 1 diabetes with noncompliance Depression, Suicidal ideation, Noncompliance, Seizure disorder  Significant Hospital Events   Transferred to [facility] 04/22/2018 for glucose greater than 1600 and potassium greater than 7.5; admitted with hyperosmolar nonketotic acidosis and hypertensive emergency.  His blood glucose was quite labile.   2/17: Off insulin transferred back to internal medicine service 2/18: Treating hyperglycemia.  Getting dialysis, controlling blood pressure 2/21 more lethargic.  Blood glucose 508. 2/22: Glucose now greater than 600, lethargic still.  Is ago exhibiting evidence of HON K and hyperkalemia as well as worsening acute metabolic encephalopathy.  Will bring back to intensive care Consults:  Nephrology 04/22/2010  Procedures:  2/17 iHD  Significant Diagnostic Tests:    Micro Data:  2/16 MRSA PCR >> neg  Antimicrobials:    Interim history/subjective:  Lethargic, will follow commands.  Extremely hyperglycemic  Objective   Blood pressure (Abnormal) 147/79, pulse 91, temperature 98.1 F (36.7 C), temperature source Oral, resp. rate 20, height 5\' 11"  (1.803 m), weight 90.1 kg, SpO2 99 %.        Intake/Output Summary (Last 24 hours) at 04/28/2018 2774 Last data filed at 04/28/2018 0600 Gross per 24 hour  Intake 400 ml  Output 0 ml  Net 400 ml   Filed Weights   04/26/18 1830 04/26/18 2032 04/27/18 1944  Weight: 88.2 kg 88.4 kg 90.1 kg     Examination: General: This is a 38 year old African-American male he is lying in bed.  He is nonverbal but will follow commands he is lethargic HEENT: Normocephalic atraumatic no jugular venous distention mucous membranes moist Pulmonary: Clear to auscultation no accessory use Cardiac: Regular rate and rhythm tachycardic prominent systolic murmur which radiates to neck Abdomen: Soft not tender Extremities: Warm and dry Neuro: Moves all extremities, lethargic, nonverbal, follows commands GU: An uric   Resolved Hospital Problem list     Assessment & Plan:  Recurrent hyperosmolar nonketotic acidosis in the setting of noncompliance with medication.  -His glucose is now greater than 800.  He has been very labile with episodes of hypoglycemia on 2/21.  Also wondering about compliance in general Plan Agree with transfer to the intensive care Starting insulin drip Check beta hydroxybutyric acid Arterial blood gas Ck ammonia  Acute metabolic encephalopathy -May be element of acidosis contributing -Also due for dialysis Plan Holding all sedating medications Treating metabolic derangements Agree with transfer to intensive care Currently protecting airway  End-stage renal disease w/ Hyperkalemia and pseudo-hyponatremia d/t hyperglycemia Plan Nephrology called Will get emergent HD Will need serial chemistries   Hypertension History of ventricular tachycardia Plan Cont supportive care PRN hydralazine for now  Eventually add back orals   History of depression History of seizures History of suicide attempt Prior voluntary commitment  Plan Stopping risperdal Cont keppra May need to consider EEG Holding prozac    Best practice:  Diet: renal / DM diet ->.NPO Pain/Anxiety/Delirium protocol (if indicated): Not indicated VAP protocol (if indicated): None indicated DVT prophylaxis: Subcu heparin GI prophylaxis: po Glucose control: Glucomander Mobility: Bedrest Code  Status: Full Family Communication: No family at  bedside 2/17 Disposition: Critically ill with labile glucose, acute encephalopathy, hyperglycemia, mild metabolic acidosis.  Also has life-threatening hyperkalemia this is already been treated.  He needs emergent dialysis, insulin drip, and close observation in the intensive care.  He is currently protecting his airway, I do not think he needs intubation at this point.  Erick Colace ACNP-BC [place] Pager # [phone] OR # [phone] if no answer  Attending Note:  38 year old male with ESRD on HD who was admitted for DKA and hyperkalemia.  Patient was stabilized and transferred out of the ICU to come back to the ICU this AM with AMS, hyperkalemia and recurrence of DKA.  On exam, he is very lethargic but protecting his airway.  I reviewed CXR myself, no acute disease noted.  Discussed with TRH-MD.  Transfer back to the ICU.  Hold sedating medications.  Restart DKA protocol with minimal fluid given ESRD.  Insulin drip.  Will need diabetic coordinator for baseline insulin prior to transfer out of the ICU.  Contacted nephrology will dialyze in the ICU given drip need.  Monitor closely for airway protection.  PCCM will assume care.  The patient is critically ill with multiple organ systems failure and requires high complexity decision making for assessment and support, frequent evaluation and titration of therapies, application of advanced monitoring technologies and extensive interpretation of multiple databases.   Critical Care Time devoted to patient care services described in this note is  45  Minutes. This time reflects time of care of this signee Dr Jennet Maduro. This critical care time does not reflect procedure time, or teaching time or supervisory time of PA/NP/Med student/Med Resident etc but could involve care discussion time.  Rush Farmer, M.D. [facility] Pulmonary/Critical Care Medicine. Pager: [phone]. After hours  pager: [phone].

## 2018-04-28 NOTE — Care Management (Signed)
Spoke w [facility] [employer]. Levemir is NOT covered, Lantus IS covered. Patient filled 2 boxes of Lantus on Feb 9th, copay $3.90

## 2018-04-28 NOTE — Progress Notes (Signed)
Critical value glycose of 563 was reported by LAB. Pt is on continuous insulin drip at this time

## 2018-04-28 NOTE — Progress Notes (Signed)
Spoke to MD about the situation. No Labs were drawn due to dialysis. Pt refused IV infusions due to "HUNGER" See new orders.

## 2018-04-28 NOTE — Progress Notes (Signed)
Results for Edward Colon, Edward Colon (MRN [id number]) as of 04/28/2018 18:34  Ref. Range 04/28/2018 06:46  Potassium Latest Ref Range: 3.5 - 5.1 mmol/L 7.3 [facility])   Results for Edward Colon, Edward Colon (MRN [id number]) as of 04/28/2018 18:34  Ref. Range 04/28/2018 06:46  Glucose Latest Ref Range: 70 - 99 mg/dL [facility])   MD notified. New orders received.  Patient will transfer to ICU

## 2018-04-28 NOTE — Progress Notes (Addendum)
Follow-up exam.  He is now much more awake.  Glucose is rapidly improving.  Currently getting dialysis, but suspect anion gap is closed. Now fully awake, I think he can safely take his Keppra orally.  Plan Continue current Glucomander protocol Plan on following traditional glucose stabilizer transition orders with Levemir I think we can forego central access as he can safely take his oral [place] ACNP-BC [place] Pager # [phone] OR # [phone] if no answer

## 2018-04-28 NOTE — Progress Notes (Addendum)
[facility] Progress Note   Subjective: Lethargic at present. No C/Os.  BS 821. K+ 7.3. HD ASAP. Being transferred to ICU for insulin gtt/monitoring.      Objective Vitals:   04/27/18 0853 04/27/18 1800 04/27/18 1944 04/28/18 0441  BP: (!) 146/79 (!) 152/83 136/75 (!) 147/79  Pulse: (!) 107 98 (!) 102 91  Resp: 18 18 20 20   Temp: 98.5 F (36.9 C) 98.1 F (36.7 C) 98.2 F (36.8 C) 98.1 F (36.7 C)  TempSrc: Oral Oral Oral Oral  SpO2: 98% 98% 99% 99%  Weight:   90.1 kg   Height:       Physical Exam General: WN,WD lethargic Heart: J8,A4 1/6 systolic M Lungs: CTAB Respirations shallow Abdomen: Active BS Extremities: No LE edema Dialysis Access: L AVG + bruit   Additional Objective Labs: Basic Metabolic Panel: Recent Labs  Lab 04/22/18 2306  04/24/18 1140  04/26/18 1404 04/27/18 0458 04/28/18 0646  NA 118*   < > 135   < > 132* 128* 125*  K 4.8   < > 4.5   < > 3.7 4.4 7.3*  CL 72*   < > 94*   < > 92* 93* 90*  CO2 17*   < > 24   < > 24 23 20*  GLUCOSE 1,577*   < > 320*   < > 151* 412* 821*  BUN 80*   < > 37*   < > 43* 26* 45*  CREATININE 11.80*   < > 9.57*   < > 11.89* 8.16* 11.38*  CALCIUM 8.0*   < > 8.2*   < > 8.8* 8.0* 7.7*  PHOS 7.3*  --  7.0*  --  2.8  --   --    < > = values in this interval not displayed.   Liver Function Tests: Recent Labs  Lab 04/22/18 2306 04/24/18 1140 04/25/18 0348 04/26/18 1404  AST 34  --  32  --   ALT 28  --  21  --   ALKPHOS 67  --  60  --   BILITOT 1.0  --  1.1  --   PROT 7.2  --  6.9  --   ALBUMIN 3.6 3.2* 3.3* 3.4*   Recent Labs  Lab 04/22/18 2306  LIPASE 124*   CBC: Recent Labs  Lab 04/22/18 1815  04/23/18 1311 04/24/18 1140 04/25/18 0348 04/26/18 1403 04/27/18 0458  WBC 12.4*   < > 10.4 5.9 4.8 5.2 4.9  NEUTROABS 10.7*  --   --   --   --   --  2.8  HGB 10.3*   < > 8.8* 9.0* 9.7* 9.0* 8.7*  HCT 35.6*   < > 25.2* 27.2* 29.2* 27.8* 26.4*  MCV 96.5   < > 80.5 84.0 85.4 84.5 84.6  PLT 296    < > 248 244 205 192 188   < > = values in this interval not displayed.   Blood Culture    Component Value Date/Time   SDES TRACHEAL ASPIRATE 03/15/2018 0738   SPECREQUEST NONE 03/15/2018 0738   CULT MODERATE Consistent with normal respiratory flora. 03/15/2018 0738   REPTSTATUS 03/17/2018 FINAL 03/15/2018 0738    Cardiac Enzymes: No results for input(s): CKTOTAL, CKMB, CKMBINDEX, TROPONINI in the last 168 hours. CBG: Recent Labs  Lab 04/27/18 2357 04/28/18 0609 04/28/18 0612 04/28/18 0659 04/28/18 0719  GLUCAP 181* >600* >600* >600* >600*   Iron Studies: No results for input(s): IRON, TIBC, TRANSFERRIN, FERRITIN in  the last 72 hours. @lablastinr3 @ Studies/Results: No results found. Medications: . sodium chloride    . dextrose 5 % and 0.45% NaCl    . insulin     . albuterol  10 mg Nebulization Once  . calcium acetate  1,334 mg Oral TID WC  . calcium gluconate  1 g Intravenous Once  . Chlorhexidine Gluconate Cloth  6 each Topical Q0600  . Chlorhexidine Gluconate Cloth  6 each Topical Q0600  . cinacalcet  60 mg Oral Q T,Th,Sa-HD  . FLUoxetine  20 mg Oral Daily  . heparin      . heparin  5,000 Units Subcutaneous Q8H  . insulin glargine  8 Units Subcutaneous BID  . insulin regular  0-10 Units Intravenous TID WC  . isosorbide-hydrALAZINE  1 tablet Oral TID  . levETIRAcetam  250 mg Oral Q T,Th,Sat-1800  . levETIRAcetam  500 mg Oral Q0600  . multivitamin  1 tablet Oral QHS  . NIFEdipine  30 mg Oral BID  . polyethylene glycol  17 g Oral Daily  . risperiDONE  1 mg Oral BID  . senna-docusate  1 tablet Oral BID  . sodium bicarbonate  100 mEq Intravenous Once     Dialysis:TTS SW 4h 2min 88.5kg 2/2.25 bath 500/800 LUA AVG Hep 5000 - mircera 60 every 2 wks - venofer 50 /wk - cinacalcet 90 mg po tiw   Assessment: 1. Severe hyperkalemia - in setting of hyperglycemia-K+ 7.3. Giving calcium gluconate, insulin, bicarb. Monitoring labs. HD today ASAP. EKG SR  with tall T waves in V leads.  This will improve w/ insulin IV.  2. ESRD: on HD TTS. HD today.  3. DM1 - BS 821 Na 125 C Na 137. Transferring to ICU for insulin gtt/monitoring.  4. HGB 8.7.  ESA due tomorrow. Give Aranesp 150 mcg IV with HD.  5. Vol - euvolemic, close to dry wt. 0.5-1.5 liters with HD today 6. HTN -BP's soft, will reduce BP meds by 50% 7. CKD BMD-Ca 8.0 C Ca 8.5. Decrease sensipar to 60 mg PO daily, Last Phos 3.5 decrease binders to 2 caps PO TID Ac.  8. Seizures - on last admit, taking Keppra po  9. Psych - long hx depression , seen by psych no harm to self/ others 10. Pseudo-hyponatremia-in setting of hyperglycemia.   Rita H. Brown NP-C 04/28/2018, 8:38 AM  [facility] [phone]  Pt seen, examined and agree w A/P as above.  [facility] 04/28/2018, 12:59 PM

## 2018-04-28 NOTE — Progress Notes (Signed)
Patient's CBG >600,patient sleepy but alert and responsive. MD on call text paged. Patient supposed to go to hemodialysis,Hemo RN made aware.Order received to give novolog 15 units SQ,given. Will continue to monitor. Edward Colon, Wonda Cheng, Therapist, sports

## 2018-04-28 NOTE — Progress Notes (Signed)
IV team was unable to have IV excess. MD notified

## 2018-04-28 NOTE — Progress Notes (Signed)
Pt is very agitated and rude to staff. States:"I am hungry". Sugar levels in 80s at this time. Refuses IV infusion. MD called and notified. Diet orders are in. Dinner order was placed in the room.

## 2018-04-28 NOTE — Progress Notes (Signed)
Spoke with MD regarding scheduled insulin.  Will hold sliding scale insulin until MD further evaluates.  Will only give scheduled 5 units if patient consumes >50% of breakfast.  Breakfast tray has been delivered but patient is currently asleep.  Will continue to monitor and assess

## 2018-04-28 NOTE — Progress Notes (Signed)
Recheck CBG after giving 15 units of novolog >600. Text paged Attending. Awaiting for call back. Will continue to monitor. 07:28 Repaged MD regarding patient's elevated CBG. Spoke with [place],MD. Order received to give 10 am dose of lantus now. MD awaiting result of BMP. Incoming RN made aware. Jennessa Trigo, Wonda Cheng, Therapist, sports

## 2018-04-28 NOTE — Progress Notes (Signed)
Inpatient Diabetes Program Recommendations  AACE/ADA: New Consensus Statement on Inpatient Glycemic Control (2015)  Target Ranges:  Prepandial:   less than 140 mg/dL      Peak postprandial:   less than 180 mg/dL (1-2 hours)      Critically ill patients:  140 - 180 mg/dL   Lab Results  Component Value Date   GLUCAP 554 (HH) 04/28/2018   HGBA1C 9.1 (H) 02/13/2018    Review of Glycemic Control Results for LUCIANO, CINQUEMANI (MRN [id number]) as of 04/28/2018 10:05  Ref. Range 04/28/2018 06:09 04/28/2018 06:12 04/28/2018 06:59 04/28/2018 07:19 04/28/2018 10:01  Glucose-Capillary Latest Ref Range: 70 - 99 mg/dL >600 (HH) >600 (HH) >600 (HH) >600 (HH) 554 (HH)  Results for ANTOINE, VANDERMEULEN (MRN [id number]) as of 04/28/2018 10:05  Ref. Range 04/27/2018 17:56 04/27/2018 19:46 04/27/2018 23:57  Glucose-Capillary Latest Ref Range: 70 - 99 mg/dL 122 (H) 145 (H) 181 (H)  Diabetes history: DM Type 1  Outpatient Diabetes medications: Lantus 14 units daily + Novolog 5 units tid meal coverage + Novolog 0-9 units tid correction  Current orders for Inpatient glycemic control: IV insulin  Inpatient Diabetes Program Recommendations:    Noted transfer back to ICU. Glucose trends went from 181 mg/dL at 2357 to 821 mg/dL on serum at 0626. Unclear of oral intake during the hours of 0000-0600 (even though, RN documentation shows 0%) as patient did not appear to receive other medications to explain significant rise in glucose.   In agreement with current plan. Will follow.   Thanks, Bronson Curb, MSN, RNC-OB Diabetes Coordinator [phone] (8a-5p)

## 2018-04-29 DIAGNOSIS — F3289 Other specified depressive episodes: Secondary | ICD-10-CM

## 2018-04-29 LAB — BASIC METABOLIC PANEL
ANION GAP: 13 (ref 5–15)
ANION GAP: 11 (ref 5–15)
Anion gap: 10 (ref 5–15)
Anion gap: 11 (ref 5–15)
Anion gap: 11 (ref 5–15)
Anion gap: 11 (ref 5–15)
BUN: 15 mg/dL (ref 6–20)
BUN: 19 mg/dL (ref 6–20)
BUN: 21 mg/dL — ABNORMAL HIGH (ref 6–20)
BUN: 23 mg/dL — ABNORMAL HIGH (ref 6–20)
BUN: 25 mg/dL — AB (ref 6–20)
BUN: 32 mg/dL — AB (ref 6–20)
CALCIUM: 9 mg/dL (ref 8.9–10.3)
CO2: 26 mmol/L (ref 22–32)
CO2: 26 mmol/L (ref 22–32)
CO2: 26 mmol/L (ref 22–32)
CO2: 28 mmol/L (ref 22–32)
CO2: 26 mmol/L (ref 22–32)
CO2: 25 mmol/L (ref 22–32)
Calcium: 8.2 mg/dL — ABNORMAL LOW (ref 8.9–10.3)
Calcium: 8.5 mg/dL — ABNORMAL LOW (ref 8.9–10.3)
Calcium: 8.2 mg/dL — ABNORMAL LOW (ref 8.9–10.3)
Calcium: 8.6 mg/dL — ABNORMAL LOW (ref 8.9–10.3)
Calcium: 8.1 mg/dL — ABNORMAL LOW (ref 8.9–10.3)
Chloride: 95 mmol/L — ABNORMAL LOW (ref 98–111)
Chloride: 100 mmol/L (ref 98–111)
Chloride: 98 mmol/L (ref 98–111)
Chloride: 98 mmol/L (ref 98–111)
Chloride: 100 mmol/L (ref 98–111)
Chloride: 92 mmol/L — ABNORMAL LOW (ref 98–111)
Creatinine, Ser: 5.76 mg/dL — ABNORMAL HIGH (ref 0.61–1.24)
Creatinine, Ser: 6.65 mg/dL — ABNORMAL HIGH (ref 0.61–1.24)
Creatinine, Ser: 6.89 mg/dL — ABNORMAL HIGH (ref 0.61–1.24)
Creatinine, Ser: 7.64 mg/dL — ABNORMAL HIGH (ref 0.61–1.24)
Creatinine, Ser: 8.34 mg/dL — ABNORMAL HIGH (ref 0.61–1.24)
Creatinine, Ser: 8.78 mg/dL — ABNORMAL HIGH (ref 0.61–1.24)
GFR calc Af Amer: 14 mL/min — ABNORMAL LOW (ref >60)
GFR calc Af Amer: 11 mL/min — ABNORMAL LOW (ref >60)
GFR calc Af Amer: 11 mL/min — ABNORMAL LOW (ref >60)
GFR calc Af Amer: 10 mL/min — ABNORMAL LOW (ref >60)
GFR calc Af Amer: 9 mL/min — ABNORMAL LOW (ref >60)
GFR calc Af Amer: 8 mL/min — ABNORMAL LOW (ref >60)
GFR calc non Af Amer: 12 mL/min — ABNORMAL LOW (ref >60)
GFR calc non Af Amer: 10 mL/min — ABNORMAL LOW (ref >60)
GFR calc non Af Amer: 9 mL/min — ABNORMAL LOW (ref >60)
GFR calc non Af Amer: 8 mL/min — ABNORMAL LOW (ref >60)
GFR calc non Af Amer: 7 mL/min — ABNORMAL LOW (ref >60)
GFR calc non Af Amer: 7 mL/min — ABNORMAL LOW (ref >60)
GLUCOSE: 421 mg/dL — AB (ref 70–99)
GLUCOSE: 51 mg/dL — AB (ref 70–99)
GLUCOSE: 184 mg/dL — AB (ref 70–99)
Glucose, Bld: 161 mg/dL — ABNORMAL HIGH (ref 70–99)
Glucose, Bld: 30 mg/dL — CL (ref 70–99)
Glucose, Bld: 490 mg/dL — ABNORMAL HIGH (ref 70–99)
Potassium: 5.5 mmol/L — ABNORMAL HIGH (ref 3.5–5.1)
Potassium: 3.9 mmol/L (ref 3.5–5.1)
Potassium: 3.8 mmol/L (ref 3.5–5.1)
Potassium: 4.8 mmol/L (ref 3.5–5.1)
Potassium: 4.3 mmol/L (ref 3.5–5.1)
Potassium: 6 mmol/L — ABNORMAL HIGH (ref 3.5–5.1)
SODIUM: 137 mmol/L (ref 135–145)
SODIUM: 128 mmol/L — AB (ref 135–145)
Sodium: 131 mmol/L — ABNORMAL LOW (ref 135–145)
Sodium: 137 mmol/L (ref 135–145)
Sodium: 137 mmol/L (ref 135–145)
Sodium: 137 mmol/L (ref 135–145)

## 2018-04-29 LAB — GLUCOSE, CAPILLARY
GLUCOSE-CAPILLARY: 84 mg/dL (ref 70–99)
Glucose-Capillary: 60 mg/dL — ABNORMAL LOW (ref 70–99)
Glucose-Capillary: 62 mg/dL — ABNORMAL LOW (ref 70–99)
Glucose-Capillary: 36 mg/dL — CL (ref 70–99)
Glucose-Capillary: 90 mg/dL (ref 70–99)
Glucose-Capillary: 287 mg/dL — ABNORMAL HIGH (ref 70–99)
Glucose-Capillary: 48 mg/dL — ABNORMAL LOW (ref 70–99)
Glucose-Capillary: 86 mg/dL (ref 70–99)
Glucose-Capillary: 457 mg/dL — ABNORMAL HIGH (ref 70–99)

## 2018-04-29 MED ORDER — ISOSORB DINITRATE-HYDRALAZINE 20-37.5 MG PO TABS
1.0000 | ORAL_TABLET | Freq: Three times a day (TID) | ORAL | Status: DC
  Administered 2018-04-29 – 2018-05-02 (×10): 1 via ORAL
  Filled 2018-04-29 (×10): qty 1

## 2018-04-29 MED ORDER — INSULIN ASPART 100 UNIT/ML ~~LOC~~ SOLN
0.0000 [IU] | Freq: Three times a day (TID) | SUBCUTANEOUS | Status: DC
  Administered 2018-04-29: 8 [IU] via SUBCUTANEOUS
  Administered 2018-04-30: 2 [IU] via SUBCUTANEOUS
  Administered 2018-04-30: 11 [IU] via SUBCUTANEOUS
  Administered 2018-04-30: 3 [IU] via SUBCUTANEOUS

## 2018-04-29 MED ORDER — FLUOXETINE HCL 20 MG PO CAPS
20.0000 mg | ORAL_CAPSULE | Freq: Every day | ORAL | Status: DC
  Administered 2018-04-29 – 2018-05-03 (×5): 20 mg via ORAL
  Filled 2018-04-29 (×5): qty 1

## 2018-04-29 MED ORDER — INSULIN ASPART 100 UNIT/ML ~~LOC~~ SOLN
8.0000 [IU] | Freq: Once | SUBCUTANEOUS | Status: AC
  Administered 2018-04-29: 8 [IU] via SUBCUTANEOUS

## 2018-04-29 MED ORDER — MINOXIDIL 2.5 MG PO TABS
2.5000 mg | ORAL_TABLET | Freq: Every day | ORAL | Status: DC
  Administered 2018-04-29 – 2018-04-30 (×2): 2.5 mg via ORAL
  Filled 2018-04-29 (×2): qty 1

## 2018-04-29 MED ORDER — INFLUENZA VAC SPLIT QUAD 0.5 ML IM SUSY: 0.5000 mL | PREFILLED_SYRINGE | INTRAMUSCULAR | Status: DC | PRN

## 2018-04-29 MED ORDER — NIFEDIPINE ER OSMOTIC RELEASE 30 MG PO TB24
30.0000 mg | ORAL_TABLET | Freq: Every day | ORAL | Status: DC
  Administered 2018-04-29 – 2018-04-30 (×2): 30 mg via ORAL
  Filled 2018-04-29 (×2): qty 1

## 2018-04-29 MED ORDER — PNEUMOCOCCAL VAC POLYVALENT 25 MCG/0.5ML IJ INJ: 0.5000 mL | INJECTION | INTRAMUSCULAR | Status: DC | PRN

## 2018-04-29 MED ORDER — INSULIN ASPART 100 UNIT/ML ~~LOC~~ SOLN
5.0000 [IU] | Freq: Three times a day (TID) | SUBCUTANEOUS | Status: DC
  Administered 2018-04-29: 5 [IU] via SUBCUTANEOUS

## 2018-04-29 MED ORDER — RISPERIDONE 1 MG PO TABS
1.0000 mg | ORAL_TABLET | Freq: Two times a day (BID) | ORAL | Status: DC
  Administered 2018-04-29 – 2018-05-03 (×9): 1 mg via ORAL
  Filled 2018-04-29 (×9): qty 1
  Filled 2018-04-29: qty 2

## 2018-04-29 NOTE — Progress Notes (Signed)
   NAME:  Edward Colon, MRN:  [id number], DOB:  04/15/1980, LOS: 7 ADMISSION DATE:  04/22/2018,  REFERRING MD: Urgency department physician, CHIEF COMPLAINT: Hypercalcemia, hyperkalemia, hypertension in the setting of end-stage renal disease  Brief History   38 year old ESRD, insulin-dependent brittle diabetic noncompliant who presents with hyperglycemia > 1600, hyperkalemia and hypertension, s/p iHD 2/17 am   Past Medical History  End-stage renal disease,Cardiac arrest 03/2018, Type 1 diabetes with noncompliance Depression, Suicidal ideation, Noncompliance, Seizure disorder  Significant Hospital Events   Transferred to [facility] 04/22/2018 for glucose greater than 1600 and potassium greater than 7.5; admitted with hyperosmolar nonketotic acidosis and hypertensive emergency.  His blood glucose was quite labile.   2/17: Off insulin transferred back to internal medicine service 2/18: Treating hyperglycemia.  Getting dialysis, controlling blood pressure 2/21 more lethargic.  Blood glucose 508. 2/22: Glucose now greater than 600, lethargic still.  Is ago exhibiting evidence of HON K and hyperkalemia as well as worsening acute metabolic encephalopathy.  Will bring back to intensive care Consults:  Nephrology 04/22/2010  Procedures:  2/17 iHD  Significant Diagnostic Tests:    Micro Data:  2/16 MRSA PCR >> neg  Antimicrobials:    Interim history/subjective:  Brought back to ICU yesterday for lethargy received HD  Objective   Blood pressure (!) 167/93, pulse 83, temperature 98 F (36.7 C), temperature source Oral, resp. rate 16, height 5\' 11"  (1.803 m), weight 88.7 kg, SpO2 98 %.        Intake/Output Summary (Last 24 hours) at 04/29/2018 0836 Last data filed at 04/29/2018 0400 Gross per 24 hour  Intake 481.49 ml  Output 1500 ml  Net -1018.51 ml   Filed Weights   04/28/18 1220 04/28/18 1643 04/29/18 0400  Weight: 90.4 kg 88.7 kg 88.7 kg     Examination:  General:  Resting comfortably in bed HENT: NCAT OP clear PULM: CTA B, normal effort CV: RRR, no mgr GI: BS+, soft, nontender MSK: normal bulk and tone Neuro: awake, alert, no distress, MAEW   Resolved Hospital Problem list     Assessment & Plan:  Recurrent hyperosmolar nonketotic acidosis in the setting of noncompliance with medication: improved Periods of relative hypoglycemia DM type 1 Plan Continue Glargine scheduled 14 U daily Continue scheduled regular insulin Continue schedule SSI Diabetic diet  Acute metabolic encephalopathy: resolved Plan Hold sedatives  ESRD Plan HD per renal Monitor BMET  Hypertension History of ventricular tachycardia Plan Prn hydralazine Restart home minoxidil, Bidil, nifedipine  History of depression History of seizures History of suicide attempt Prior voluntary commitment  Plan Restart risperdal here Continue keppra Restart prozac    Best practice:  Diet: renal / DM diet ->advance Pain/Anxiety/Delirium protocol (if indicated): Not indicated VAP protocol (if indicated): None indicated DVT prophylaxis: Sub cutaneous heparin GI prophylaxis: po Glucose control: Glucomander Mobility: Bedrest Code Status: Full Family Communication: No family at bedside 2/17 Disposition: move to floor, TRH  Roselie Awkward, MD [facility] Pager: [phone] Cell: [phone] If no response, call [phone]

## 2018-04-29 NOTE — Progress Notes (Signed)
Patient called out to this nurse insisting on getting something else to eat. RN gave patient his snack that was present on the unit. Patient stated that it was not enough. Patient very agitated and aggressive with this RN. Stating we are trying to starve him and what we are doing, as far as controlling his CBG, is not working. RN gave patient graham crackers and saltines. Pt is now satisfied. Will recheck CBG at 0400 as ordered.   Margaret Pyle, RN

## 2018-04-29 NOTE — Progress Notes (Signed)
CBG is 457 at this time. Paged [place], awaiting response.   Eleanora Neighbor, RN

## 2018-04-29 NOTE — Progress Notes (Signed)
Hypoglycemic Event  CBG: 36  Treatment: 1/2amp D50 (pt will not allow RN to give full amp)  Symptoms: None  Follow-up CBG: CXWN:7209 CBG Result:90  Possible Reasons for Event: Brittle Diabetic     Margaret Pyle

## 2018-04-29 NOTE — Progress Notes (Signed)
[facility] Progress Note   Subjective: AG down to 11, K normal, BS's 80- 280 overnight. Off IV insulin and transferred out to floor. Up in chair, no c/o.   Objective Vitals:   04/29/18 0700 04/29/18 0800 04/29/18 0810 04/29/18 1049  BP: (!) 167/93 (!) 158/88  (!) 159/80  Pulse: 83 82  (!) 102  Resp: 16 15  18   Temp:   98 F (36.7 C) 98.2 F (36.8 C)  TempSrc:   Oral Oral  SpO2: 98% 97%  100%  Weight:      Height:       Physical Exam General: WN,WD lethargic Heart: V9,D6 1/6 systolic M Lungs: CTAB Respirations shallow Abdomen: Active BS Extremities: No LE edema Dialysis Access: L AVG + bruit  Dialysis:TTS SW 4h 109min 88.5kg 2/2.25 bath 500/800 LUA AVG Hep 5000 - mircera 60 every 2 wks - venofer 50 /wk - cinacalcet 90 mg po tiw  Assessment: 1. Hyperkalemia - resolved 2. Uncont DM1/ HONK - better now, off of IV insulin, back to floor 3. ESRD: on HD TTS. Had HD yesterday.  4. Anemia ckd: HGB 9, no esa given here, was on mircera 60ug at center 5. Vol - euvolemic, at dry wt 6. HTN -cont meds 7. CKD BMD-Ca 8.0 C Ca 8.5. Decrease sensipar to 60 mg PO daily, Last Phos 3.5 decrease binders to 2 caps PO TID Ac.  8. Seizures - on last admit, taking Keppra po  9. Psych - long hx depression , seen by psych no harm to self/ others  [facility] 04/29/2018, 1:45 PM   Additional Objective Labs: Basic Metabolic Panel: Recent Labs  Lab 04/24/18 1140  04/26/18 1404  04/28/18 1034  04/29/18 0134 04/29/18 0545 04/29/18 0945  NA 135   < > 132*   < > 130*   < > 137 137 137  K 4.5   < > 3.7   < > 4.3   < > 3.9 3.8 4.8  CL 94*   < > 92*   < > 92*   < > 100 98 98  CO2 24   < > 24   < > 22   < > 26 26 28   GLUCOSE 320*   < > 151*   < > 563*   < > 161* 51* 184*  BUN 37*   < > 43*   < > 49*   < > 19 21* 23*  CREATININE 9.57*   < > 11.89*   < > 11.69*   < > 6.65* 6.89* 7.64*  CALCIUM 8.2*   < > 8.8*   < > 8.3*   < > 8.5* 8.2* 8.6*   PHOS 7.0*  --  2.8  --  2.4*  --   --   --   --    < > = values in this interval not displayed.   Liver Function Tests: Recent Labs  Lab 04/22/18 2306  04/25/18 0348 04/26/18 1404 04/28/18 1034  AST 34  --  32  --   --   ALT 28  --  21  --   --   ALKPHOS 67  --  60  --   --   BILITOT 1.0  --  1.1  --   --   PROT 7.2  --  6.9  --   --   ALBUMIN 3.6   < > 3.3* 3.4* 3.3*   < > = values in this interval  not displayed.   Recent Labs  Lab 04/22/18 2306  LIPASE 124*   CBC: Recent Labs  Lab 04/22/18 1815  04/24/18 1140 04/25/18 0348 04/26/18 1403 04/27/18 0458 04/28/18 1034  WBC 12.4*   < > 5.9 4.8 5.2 4.9 5.0  NEUTROABS 10.7*  --   --   --   --  2.8  --   HGB 10.3*   < > 9.0* 9.7* 9.0* 8.7* 9.1*  HCT 35.6*   < > 27.2* 29.2* 27.8* 26.4* 27.6*  MCV 96.5   < > 84.0 85.4 84.5 84.6 84.4  PLT 296   < > 244 205 192 188 207   < > = values in this interval not displayed.   Blood Culture    Component Value Date/Time   SDES TRACHEAL ASPIRATE 03/15/2018 0738   SPECREQUEST NONE 03/15/2018 0738   CULT MODERATE Consistent with normal respiratory flora. 03/15/2018 0738   REPTSTATUS 03/17/2018 FINAL 03/15/2018 0738    Cardiac Enzymes: No results for input(s): CKTOTAL, CKMB, CKMBINDEX, TROPONINI in the last 168 hours. CBG: Recent Labs  Lab 04/29/18 0419 04/29/18 0618 04/29/18 0654 04/29/18 0834 04/29/18 1049  GLUCAP 62* 36* 90 84 287*   Iron Studies: No results for input(s): IRON, TIBC, TRANSFERRIN, FERRITIN in the last 72 hours. @lablastinr3 @ Studies/Results: No results found. Medications: . sodium chloride    . sodium chloride    . sodium chloride    . dextrose 5 % and 0.45% NaCl Stopped (04/28/18 1644)   . FLUoxetine  20 mg Oral Daily  . heparin  5,000 Units Subcutaneous Q8H  . insulin aspart  0-15 Units Subcutaneous TID WC  . insulin aspart  5 Units Subcutaneous TID WC  . insulin glargine  14 Units Subcutaneous Daily  . isosorbide-hydrALAZINE  1 tablet Oral  TID  . levETIRAcetam  250 mg Oral Q T,Th,Sat-1800  . levETIRAcetam  500 mg Oral Daily  . minoxidil  2.5 mg Oral Daily  . NIFEdipine  30 mg Oral Daily  . risperiDONE  1 mg Oral BID

## 2018-04-29 NOTE — Progress Notes (Signed)
Pt requesting double portion size for his meals. States that the meals he are receiving are not filling him up and he remains hungry all day and night. RN called dietary. Dietary stated they are only doing standard portions this AM but they can do double for lunch. Request will have to be put in later this morning. Will inform day shift RN

## 2018-04-29 NOTE — Progress Notes (Signed)
Hypoglycemic Event  CBG:60  Treatment: 4oz of juice  Symptoms: none  Follow-up CBG: Time:0419 CBG Result:62  Possible Reasons for Event: Brittle Diabetic  Comments/MD notified: This RN tried to give patient 1/2amp D50 after recheck CBG of 62. Patient refused and instructed RN to recheck his sugar in an hour. Pt is asymptomatic.    Margaret Pyle

## 2018-04-29 NOTE — Progress Notes (Signed)
[place] Progress Note Patient Name: Edward Colon DOB: 08-20-1980 MRN: [id number]   Date of Service  04/29/2018  HPI/Events of Note  Hyperglycemia - Blood glucose = 457. Patient is very brittle and dropped his blood glucose to 48 earlier today after getting Novolog 8 units [place] for a blood glucose = 287.  eICU Interventions  Will order: 1. Novolog insulin 8 units [place] now.      Intervention Category Major Interventions: Hyperglycemia - active titration of insulin therapy  Lysle Dingwall 04/29/2018, 10:31 PM

## 2018-04-30 DIAGNOSIS — Z9189 Other specified personal risk factors, not elsewhere classified: Secondary | ICD-10-CM

## 2018-04-30 LAB — BASIC METABOLIC PANEL
Anion gap: 12 (ref 5–15)
Anion gap: 13 (ref 5–15)
BUN: 36 mg/dL — ABNORMAL HIGH (ref 6–20)
BUN: 42 mg/dL — ABNORMAL HIGH (ref 6–20)
CALCIUM: 8.7 mg/dL — AB (ref 8.9–10.3)
CO2: 27 mmol/L (ref 22–32)
CO2: 25 mmol/L (ref 22–32)
Calcium: 8.2 mg/dL — ABNORMAL LOW (ref 8.9–10.3)
Chloride: 97 mmol/L — ABNORMAL LOW (ref 98–111)
Chloride: 97 mmol/L — ABNORMAL LOW (ref 98–111)
Creatinine, Ser: 9.85 mg/dL — ABNORMAL HIGH (ref 0.61–1.24)
Creatinine, Ser: 11.02 mg/dL — ABNORMAL HIGH (ref 0.61–1.24)
GFR calc Af Amer: 7 mL/min — ABNORMAL LOW (ref >60)
GFR calc Af Amer: 6 mL/min — ABNORMAL LOW (ref >60)
GFR calc non Af Amer: 5 mL/min — ABNORMAL LOW (ref >60)
GFR, EST NON AFRICAN AMERICAN: 6 mL/min — AB (ref >60)
GLUCOSE: 150 mg/dL — AB (ref 70–99)
Glucose, Bld: 246 mg/dL — ABNORMAL HIGH (ref 70–99)
Potassium: 4.1 mmol/L (ref 3.5–5.1)
Potassium: 4.5 mmol/L (ref 3.5–5.1)
Sodium: 136 mmol/L (ref 135–145)
Sodium: 135 mmol/L (ref 135–145)

## 2018-04-30 LAB — GLUCOSE, CAPILLARY
GLUCOSE-CAPILLARY: 264 mg/dL — AB (ref 70–99)
Glucose-Capillary: >600 mg/dL (ref 70–99)
Glucose-Capillary: 166 mg/dL — ABNORMAL HIGH (ref 70–99)
Glucose-Capillary: 343 mg/dL — ABNORMAL HIGH (ref 70–99)
Glucose-Capillary: 57 mg/dL — ABNORMAL LOW (ref 70–99)
Glucose-Capillary: 148 mg/dL — ABNORMAL HIGH (ref 70–99)
Glucose-Capillary: 257 mg/dL — ABNORMAL HIGH (ref 70–99)

## 2018-04-30 MED ORDER — CALCIUM ACETATE (PHOS BINDER) 667 MG PO CAPS
2001.0000 mg | ORAL_CAPSULE | Freq: Three times a day (TID) | ORAL | Status: DC
  Administered 2018-04-30 – 2018-05-03 (×8): 2001 mg via ORAL
  Filled 2018-04-30 (×9): qty 3

## 2018-04-30 MED ORDER — DARBEPOETIN ALFA 150 MCG/0.3ML IJ SOSY
150.0000 ug | PREFILLED_SYRINGE | INTRAMUSCULAR | Status: DC
  Administered 2018-05-01: 150 ug via INTRAVENOUS
  Filled 2018-04-30: qty 0.3

## 2018-04-30 MED ORDER — RENA-VITE PO TABS
1.0000 | ORAL_TABLET | Freq: Every day | ORAL | Status: DC
  Administered 2018-04-30 – 2018-05-02 (×3): 1 via ORAL
  Filled 2018-04-30 (×3): qty 1

## 2018-04-30 MED ORDER — NIFEDIPINE ER OSMOTIC RELEASE 30 MG PO TB24
30.0000 mg | ORAL_TABLET | Freq: Every day | ORAL | Status: DC
  Administered 2018-05-01 – 2018-05-02 (×2): 30 mg via ORAL
  Filled 2018-04-30 (×2): qty 1

## 2018-04-30 MED ORDER — CHLORHEXIDINE GLUCONATE CLOTH 2 % EX PADS
6.0000 | MEDICATED_PAD | Freq: Every day | CUTANEOUS | Status: DC
  Administered 2018-05-02: 6 via TOPICAL

## 2018-04-30 NOTE — Progress Notes (Addendum)
[facility] Progress Note   Subjective:   Seen in room, sleeping but arouses to verbal stimuli. No new concerns today. Denies CP, SOB/dyspnea, N/V, edema. Blood glucose has been elevated/variable. Wants to go home but not too early.  Objective Vitals:   04/29/18 2047 04/30/18 0455 04/30/18 0500 04/30/18 0856  BP: (!) 157/78 133/75  (!) 154/81  Pulse: (!) 107 84  97  Resp: 18 20  20   Temp: 98.3 F (36.8 C) 97.8 F (36.6 C)  97.6 F (36.4 C)  TempSrc: Oral Oral  Oral  SpO2: 99% 98%  99%  Weight:   88.7 kg   Height:       Physical Exam General: Well developed, well nourished male in NAD Heart: RRR Gr 2/6 SEM, ULSBLungs: Respirations even and unlabored, lungs CTA bilaterally.  Abdomen: Soft, non-tender, non-distended. + BSLiver down 4 cm Extremities: No edema b/l lower extremities, normal muscle tone Dialysis Access: L AVG, + bruit, no erythema  Additional Objective Labs: Basic Metabolic Panel: Recent Labs  Lab 04/24/18 1140  04/26/18 1404  04/28/18 1034  04/29/18 1553 04/29/18 2226 04/30/18 0405  NA 135   < > 132*   < > 130*   < > 137 128* 136  K 4.5   < > 3.7   < > 4.3   < > 4.3 6.0* 4.1  CL 94*   < > 92*   < > 92*   < > 100 92* 97*  CO2 24   < > 24   < > 22   < > 26 25 27   GLUCOSE 320*   < > 151*   < > 563*   < > 30* 490* 246*  BUN 37*   < > 43*   < > 49*   < > 25* 32* 36*  CREATININE 9.57*   < > 11.89*   < > 11.69*   < > 8.34* 8.78* 9.85*  CALCIUM 8.2*   < > 8.8*   < > 8.3*   < > 9.0 8.1* 8.2*  PHOS 7.0*  --  2.8  --  2.4*  --   --   --   --    < > = values in this interval not displayed.   Liver Function Tests: Recent Labs  Lab 04/25/18 0348 04/26/18 1404 04/28/18 1034  AST 32  --   --   ALT 21  --   --   ALKPHOS 60  --   --   BILITOT 1.1  --   --   PROT 6.9  --   --   ALBUMIN 3.3* 3.4* 3.3*   CBC: Recent Labs  Lab 04/24/18 1140 04/25/18 0348 04/26/18 1403 04/27/18 0458 04/28/18 1034  WBC 5.9 4.8 5.2 4.9 5.0  NEUTROABS  --   --    --  2.8  --   HGB 9.0* 9.7* 9.0* 8.7* 9.1*  HCT 27.2* 29.2* 27.8* 26.4* 27.6*  MCV 84.0 85.4 84.5 84.6 84.4  PLT 244 205 192 188 207   Blood Culture    Component Value Date/Time   SDES TRACHEAL ASPIRATE 03/15/2018 0738   SPECREQUEST NONE 03/15/2018 0738   CULT MODERATE Consistent with normal respiratory flora. 03/15/2018 0738   REPTSTATUS 03/17/2018 FINAL 03/15/2018 0738   CBG: Recent Labs  Lab 04/29/18 1614 04/29/18 1637 04/29/18 2135 04/30/18 0400 04/30/18 0722  GLUCAP 48* 86 457* 264* 166*   Medications: . sodium chloride    . sodium chloride    .  sodium chloride    . dextrose 5 % and 0.45% NaCl Stopped (04/28/18 1644)   . FLUoxetine  20 mg Oral Daily  . heparin  5,000 Units Subcutaneous Q8H  . insulin aspart  0-15 Units Subcutaneous TID WC  . insulin glargine  14 Units Subcutaneous Daily  . isosorbide-hydrALAZINE  1 tablet Oral TID  . levETIRAcetam  250 mg Oral Q T,Th,Sat-1800  . levETIRAcetam  500 mg Oral Daily  . minoxidil  2.5 mg Oral Daily  . NIFEdipine  30 mg Oral Daily  . risperiDONE  1 mg Oral BID    Dialysis:TTS SW 4h 67min 88.5kg 2/2.25 bath 500/800 LUA AVG Hep 5000 - mircera 60 every 2 wks (last dose 04/14/2018) - venofer 50 /wk - cinacalcet 90 mg po tiw  Assessment/Plan: 1. Hyperkalemia: Resolved, K 4.1 this morning. RElated to glucose shifts 2. Uncontrolled T1DM with DKA: Glucose 1649 on admission. Blood glucose was elevated last night, has also had hypoglycemic events, continue insulin management per primary team. Very brittle, ? Other factors 3. Pseudo-hyponatremia: In setting of hyperglycemia. Last Na 136.  4. ESRD: On HD TTS schedule, will plan for dialysis tomorrow. Lower vol, lower meds. 5. HTN/volume:  No excess volume on exam, meeting EDW. BP somewhat elevated this AM, meds have been tapered down previously due to soft BP with better vol control.  Will plan for dialysis tomorrow per TTS schedule with UF goal 2.5L.  6. Anemia: Hgb  9.1- improving. Last mircera dose 04/14/2018. Will give aranesp 150 mcg IV with next HD.  7. Secondary hyperparathyroidism:  Ca 8.2, corrected 8.8. Last phos 2.4. Sensipar and binders decreased on 2/22, will recheck labs with dialysis tomorrow.  8. Nutrition:  Albumin 3.3, stable.  9. Seizure disorder: On Keppra per primary team 10. Depression: On prozac. Has been seen by psych. Primary following.   Anice Paganini, PA-C 04/30/2018, 10:40 AM  [facility] Pager: [phone] I have seen and examined this patient and agree with the plan of care  Seen, eval, examined,counseled.  Jeneen Rinks Onia Shiflett 04/30/2018, 1:55 PM

## 2018-04-30 NOTE — Care Management Important Message (Signed)
Important Message  Patient Details  Name: Edward Colon MRN: [id number] Date of Birth: 07/15/1980   Medicare Important Message Given:  Yes    Orbie Pyo 04/30/2018, 4:21 PM

## 2018-04-30 NOTE — Progress Notes (Signed)
PROGRESS NOTE  Edward Colon VFI:[id number] DOB: 29-Apr-1980 DOA: 04/22/2018 PCP: Nolene Ebbs, MD   LOS: 8 days   Brief Narrative / Interim history: 38 year old male with history of type 1 diabetes, ESRD on hemodialysis, history of nonadherence with prior episodes of DKA as well as severe hyperkalemia in January 2019 resulting in cardiac arrest, hypertension, seizure disorder, presented to the hospital on 2/16 with DKA.  This was in the setting of patient running out of his home insulin for the past 7 days.  He also had nausea and vomiting.  He was found to be significantly hyperkalemic, his CBG was in the 1600s and he was in DKA, and was transferred to [facility], ICU for emergent dialysis.  He improved and was transferred to the hospitalist service on 2/21, however on 2/22 patient became significantly hyperglycemic again, was drowsy, he was hyperkalemic with a potassium of 7.3 and had EKG changes.  His anion gap was 15, and he was transferred back to the ICU for close monitoring and management.  He is being transferred back to the hospitalist service on 2/24.  Subjective: Patient has no complaints this morning, he is sitting up and eating breakfast.  He is appropriate, denies any chest pain, denies any shortness of breath.  He denies any abdominal pain, nausea or vomiting.  Assessment & Plan: Principal Problem:   Depression Active Problems:   Hyperkalemia   Hyperglycemia   MDD (major depressive disorder), recurrent severe, without psychosis ([place])   Acute encephalopathy  Principal Problem Type 1 diabetes mellitus, DKA on admission -In the setting of nonadherence, ran out of insulin several days prior.  He does not have a PCP nor endocrinology.  He was initially treated with IV insulin drip, and was transferred to ICU due to significant hyperkalemia. -He was transferred to hospitalist service on 2/21 but bounced back to the ICU on 2/22. -Transferred again to the hospitalist  service on 2/24 -Fasting CBG this morning is 166.  Currently he is on 14 units of Lantus moderate sliding scale. Continue to closely monitor CBGs  Active Problems Severe hyperkalemia with EKG changes -Potassium on admission was greater than 7.5, and he had significant EKG changes.  He was dialyzed emergently and placed in ICU at [facility]. -Potassium this morning is 4.1, stable, continue to keep patient on telemetry -I would continue to check his BMP every 12 hours for the next day or 2  Acute toxic metabolic encephalopathy -Likely in the setting of DKA and contributing uremia, alert and oriented x4 and this appears resolved this morning.  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  Continue   Scheduled Meds: . Chlorhexidine Gluconate Cloth  6 each Topical Q0600  . [START ON 05/01/2018] darbepoetin (ARANESP) injection - DIALYSIS  150 mcg Intravenous Q Tue-HD  . FLUoxetine  20 mg Oral Daily  . heparin  5,000 Units Subcutaneous Q8H  . insulin aspart  0-15 Units Subcutaneous TID WC  . insulin glargine  14 Units Subcutaneous Daily  . isosorbide-hydrALAZINE  1 tablet Oral TID  . levETIRAcetam  250 mg Oral Q T,Th,Sat-1800  .  levETIRAcetam  500 mg Oral Daily  . multivitamin  1 tablet Oral QHS  . [START ON 05/01/2018] NIFEdipine  30 mg Oral QHS  . risperiDONE  1 mg Oral BID   Continuous Infusions: . sodium chloride    . sodium chloride    . sodium chloride    . dextrose 5 % and 0.45% NaCl Stopped (04/28/18 1644)   PRN Meds:.sodium  chloride, sodium chloride, dextrose, docusate sodium, heparin, hydrALAZINE, Influenza vac split quadrivalent PF, lidocaine (PF), lidocaine-prilocaine, naLOXone (NARCAN)  injection, pentafluoroprop-tetrafluoroeth, pneumococcal 23 valent vaccine  DVT prophylaxis: Heparin Code Status: Full code Family Communication: No family present at bedside Disposition Plan: Home when ready  Consultants:   PCCM  Nephrology  Psychiatry  Procedures:  HD  Antimicrobials:  None    Objective: Vitals:   04/29/18 2047 04/30/18 0455 04/30/18 0500 04/30/18 0856  BP: (!) 157/78 133/75  (!) 154/81  Pulse: (!) 107 84  97  Resp: 18 20  20   Temp: 98.3 F (36.8 C) 97.8 F (36.6 C)  97.6 F (36.4 C)  TempSrc: Oral Oral  Oral  SpO2: 99% 98%  99%  Weight:   88.7 kg   Height:        Intake/Output Summary (Last 24 hours) at 04/30/2018 1113 Last data filed at 04/30/2018 0900 Gross per 24 hour  Intake 1000 ml  Output -  Net 1000 ml   Filed Weights   04/28/18 1643 04/29/18 0400 04/30/18 0500  Weight: 88.7 kg 88.7 kg 88.7 kg    Examination:  Constitutional: NAD Eyes: PERRL, lids and conjunctivae normal ENMT: Mucous membranes are moist.  Neck: normal, supple Respiratory: clear to auscultation bilaterally, no wheezing, no crackles. Normal respiratory effort. No accessory muscle use.  Cardiovascular: Regular rate and rhythm, no murmurs / rubs / gallops. No LE edema.  Abdomen: no tenderness. Bowel sounds positive.  Musculoskeletal: no clubbing / cyanosis.  Skin: no rashes Neurologic: CN 2-12 grossly intact. Strength 5/5 in all 4.  Psychiatric: Normal judgment and insight. Alert and oriented x 3. Normal mood.    Data Reviewed: I have independently reviewed following labs and imaging studies   CBC: Recent Labs  Lab 04/24/18 1140 04/25/18 0348 04/26/18 1403 04/27/18 0458 04/28/18 1034  WBC 5.9 4.8 5.2 4.9 5.0  NEUTROABS  --   --   --  2.8  --   HGB 9.0* 9.7* 9.0* 8.7* 9.1*  HCT 27.2*  29.2* 27.8* 26.4* 27.6*  MCV 84.0 85.4 84.5 84.6 84.4  PLT 244 205 192 188 751   Basic Metabolic Panel: Recent Labs  Lab 04/24/18 1140  04/26/18 1404  04/28/18 1034  04/29/18 0545 04/29/18 0945 04/29/18 1553 04/29/18 2226 04/30/18 0405  NA 135   < > 132*   < > 130*   < > 137 137 137 128* 136  K 4.5   < > 3.7   < > 4.3   < > 3.8 4.8 4.3 6.0* 4.1  CL 94*   < > 92*   < > 92*   < > 98 98 100 92* 97*  CO2 24   < > 24   < > 22   < > 26 28 26 25 27   GLUCOSE 320*   < > 151*   < > 563*   < > 51* 184* 30* 490* 246*  BUN 37*   < > 43*   < > 49*   < > 21* 23* 25* 32* 36*  CREATININE 9.57*   < >  11.89*   < > 11.69*   < > 6.89* 7.64* 8.34* 8.78* 9.85*  CALCIUM 8.2*   < > 8.8*   < > 8.3*   < > 8.2* 8.6* 9.0 8.1* 8.2*  PHOS 7.0*  --  2.8  --  2.4*  --   --   --   --   --   --    < > = values in this interval not displayed.   GFR: Estimated Creatinine Clearance: 11.1 mL/min (A) (by C-G formula based on SCr of 9.85 mg/dL (H)). Liver Function Tests: Recent Labs  Lab 04/24/18 1140 04/25/18 0348 04/26/18 1404 04/28/18 1034  AST  --  32  --   --   ALT  --  21  --   --   ALKPHOS  --  60  --   --   BILITOT  --  1.1  --   --   PROT  --  6.9  --   --   ALBUMIN 3.2* 3.3* 3.4* 3.3*   No results for input(s): LIPASE, AMYLASE in the last 168 hours. Recent Labs  Lab 04/28/18 1034  AMMONIA 26   Coagulation Profile: No results for input(s): INR, PROTIME in the last 168 hours. Cardiac Enzymes: No results for input(s): CKTOTAL, CKMB, CKMBINDEX, TROPONINI in the last 168 hours. BNP (last 3 results) No results for input(s): PROBNP in the last 8760 hours. HbA1C: No results for input(s): HGBA1C in the last 72 hours. CBG: Recent Labs  Lab 04/29/18 1614 04/29/18 1637 04/29/18 2135 04/30/18 0400 04/30/18 0722  GLUCAP 48* 86 457* 264* 166*   Lipid Profile: No results for input(s): CHOL, HDL, LDLCALC, TRIG, CHOLHDL, LDLDIRECT in the last 72 hours. Thyroid Function Tests: No results for  input(s): TSH, T4TOTAL, FREET4, T3FREE, THYROIDAB in the last 72 hours. Anemia Panel: No results for input(s): VITAMINB12, FOLATE, FERRITIN, TIBC, IRON, RETICCTPCT in the last 72 hours. Urine analysis:    Component Value Date/Time   COLORURINE YELLOW 03/14/2018 1135   APPEARANCEUR CLEAR 03/14/2018 1135   LABSPEC 1.014 03/14/2018 1135   PHURINE 8.0 03/14/2018 1135   GLUCOSEU >=500 (A) 03/14/2018 1135   HGBUR NEGATIVE 03/14/2018 1135   BILIRUBINUR NEGATIVE 03/14/2018 1135   KETONESUR NEGATIVE 03/14/2018 1135   PROTEINUR >=300 (A) 03/14/2018 1135   UROBILINOGEN 0.2 10/26/2013 2324   NITRITE NEGATIVE 03/14/2018 1135   LEUKOCYTESUR NEGATIVE 03/14/2018 1135   Sepsis Labs: Invalid input(s): PROCALCITONIN, LACTICIDVEN  Recent Results (from the past 240 hour(s))  MRSA PCR Screening     Status: None   Collection Time: 04/22/18 11:04 PM  Result Value Ref Range Status   MRSA by PCR NEGATIVE NEGATIVE Final    Comment:        The GeneXpert MRSA Assay (FDA approved for NASAL specimens only), is one component of a comprehensive MRSA colonization surveillance program. It is not intended to diagnose MRSA infection nor to guide or monitor treatment for MRSA infections. Performed at [facility], [place] [address] [place], [place] [address]       Radiology Studies: No results found.  Marzetta Board, MD, PhD [facility]  Contact via  [email or web address]  [place] P: [phone]  F: [phone]

## 2018-04-30 NOTE — Progress Notes (Signed)
Inpatient Diabetes Program Recommendations  AACE/ADA: New Consensus Statement on Inpatient Glycemic Control (2015)  Target Ranges:  Prepandial:   less than 140 mg/dL      Peak postprandial:   less than 180 mg/dL (1-2 hours)      Critically ill patients:  140 - 180 mg/dL   Lab Results  Component Value Date   GLUCAP 166 (H) 04/30/2018   HGBA1C 9.1 (H) 02/13/2018   Review of Glycemic Control  Diabetes history:DM1 Outpatient Diabetes medications:Lantus 14 units daily + Novolog 5 units tid meal coverage + Novolog 0-9 units tid correction Current orders for Inpatient glycemic control: Levemir 14 units Daily + Novolog Moderate Correction 0-15 units tid  Inpatient Diabetes Program Recommendations:    Patient holds onto insulin for a significantly long time. Patient has hyperglycemia, receives large doses of Novolog and drops within 6-8 hours after initiation of Novolog.  Recommend patient staying on Levemir 14 units, order a Custom Novolog scale starting at 150 mg/dl every 6 hours. and add a small amount of meal coverage. Patient seems to be very [place] when his glucose is trending down toward hypoglycemia.  Consider ordering: -Custom Novolog correction scale 0-5 units Q6 hours      151-200  1 unit      201-250  2 units      251-300  3 units      301-350  4 units      351-400  5 units  Novolog 3 units tid meal coverage  Thanks,  Tama Headings RN, MSN, BC-ADM Inpatient Diabetes Coordinator Team Pager [phone] (8a-5p)

## 2018-05-01 LAB — CBC
HCT: 24.9 % — ABNORMAL LOW (ref 39.0–52.0)
Hemoglobin: 8.2 g/dL — ABNORMAL LOW (ref 13.0–17.0)
MCH: 28.4 pg (ref 26.0–34.0)
MCHC: 32.9 g/dL (ref 30.0–36.0)
MCV: 86.2 fL (ref 80.0–100.0)
PLATELETS: 240 10*3/uL (ref 150–400)
RBC: 2.89 MIL/uL — ABNORMAL LOW (ref 4.22–5.81)
RDW: 14.2 % (ref 11.5–15.5)
WBC: 3.3 10*3/uL — ABNORMAL LOW (ref 4.0–10.5)
nRBC: 0 % (ref 0.0–0.2)

## 2018-05-01 LAB — BASIC METABOLIC PANEL
Anion gap: 15 (ref 5–15)
Anion gap: 10 (ref 5–15)
BUN: 49 mg/dL — ABNORMAL HIGH (ref 6–20)
BUN: 26 mg/dL — ABNORMAL HIGH (ref 6–20)
CO2: 21 mmol/L — ABNORMAL LOW (ref 22–32)
CO2: 25 mmol/L (ref 22–32)
Calcium: 8.6 mg/dL — ABNORMAL LOW (ref 8.9–10.3)
Calcium: 8.3 mg/dL — ABNORMAL LOW (ref 8.9–10.3)
Chloride: 96 mmol/L — ABNORMAL LOW (ref 98–111)
Chloride: 95 mmol/L — ABNORMAL LOW (ref 98–111)
Creatinine, Ser: 12.09 mg/dL — ABNORMAL HIGH (ref 0.61–1.24)
Creatinine, Ser: 6.36 mg/dL — ABNORMAL HIGH (ref 0.61–1.24)
GFR calc Af Amer: 6 mL/min — ABNORMAL LOW (ref >60)
GFR calc Af Amer: 12 mL/min — ABNORMAL LOW (ref >60)
GFR calc non Af Amer: 5 mL/min — ABNORMAL LOW (ref >60)
GFR calc non Af Amer: 10 mL/min — ABNORMAL LOW (ref >60)
Glucose, Bld: 300 mg/dL — ABNORMAL HIGH (ref 70–99)
Glucose, Bld: 425 mg/dL — ABNORMAL HIGH (ref 70–99)
Potassium: 5.4 mmol/L — ABNORMAL HIGH (ref 3.5–5.1)
Potassium: 5.3 mmol/L — ABNORMAL HIGH (ref 3.5–5.1)
SODIUM: 132 mmol/L — AB (ref 135–145)
Sodium: 130 mmol/L — ABNORMAL LOW (ref 135–145)

## 2018-05-01 LAB — GLUCOSE, CAPILLARY
GLUCOSE-CAPILLARY: 110 mg/dL — AB (ref 70–99)
Glucose-Capillary: 236 mg/dL — ABNORMAL HIGH (ref 70–99)
Glucose-Capillary: 385 mg/dL — ABNORMAL HIGH (ref 70–99)
Glucose-Capillary: 374 mg/dL — ABNORMAL HIGH (ref 70–99)

## 2018-05-01 MED ORDER — HEPARIN SODIUM (PORCINE) 1000 UNIT/ML DIALYSIS
20.0000 [IU]/kg | INTRAMUSCULAR | Status: DC | PRN
  Administered 2018-05-01: 1800 [IU] via INTRAVENOUS_CENTRAL

## 2018-05-01 MED ORDER — HEPARIN SODIUM (PORCINE) 1000 UNIT/ML IJ SOLN
INTRAMUSCULAR | Status: AC
  Administered 2018-05-01: 1800 [IU] via INTRAVENOUS_CENTRAL
  Filled 2018-05-01: qty 2

## 2018-05-01 MED ORDER — INSULIN ASPART 100 UNIT/ML ~~LOC~~ SOLN
0.0000 [IU] | Freq: Three times a day (TID) | SUBCUTANEOUS | Status: DC
  Administered 2018-05-01 – 2018-05-02 (×3): 9 [IU] via SUBCUTANEOUS
  Administered 2018-05-02 (×2): 1 [IU] via SUBCUTANEOUS

## 2018-05-01 MED ORDER — INSULIN GLARGINE 100 UNIT/ML ~~LOC~~ SOLN
18.0000 [IU] | Freq: Every day | SUBCUTANEOUS | Status: DC
  Filled 2018-05-01: qty 0.18

## 2018-05-01 MED ORDER — DARBEPOETIN ALFA 150 MCG/0.3ML IJ SOSY
PREFILLED_SYRINGE | INTRAMUSCULAR | Status: AC
  Administered 2018-05-01: 150 ug via INTRAVENOUS
  Filled 2018-05-01: qty 0.3

## 2018-05-01 MED ORDER — INSULIN ASPART 100 UNIT/ML ~~LOC~~ SOLN: 0.0000 [IU] | Freq: Every day | SUBCUTANEOUS | Status: DC

## 2018-05-01 MED ORDER — INSULIN GLARGINE 100 UNIT/ML ~~LOC~~ SOLN
16.0000 [IU] | Freq: Every day | SUBCUTANEOUS | Status: DC
  Administered 2018-05-01: 16 [IU] via SUBCUTANEOUS
  Filled 2018-05-01: qty 0.16

## 2018-05-01 NOTE — Progress Notes (Signed)
Subjective: Interval History: has no complaint .  Objective: Vital signs in last 24 hours: Temp:  [97.8 F (36.6 C)-98.6 F (37 C)] 98.3 F (36.8 C) (02/25 0502) Pulse Rate:  [86-104] 86 (02/25 0502) Resp:  [16-20] 20 (02/25 0502) BP: (129-148)/(70-80) 129/75 (02/25 0502) SpO2:  [97 %-99 %] 97 % (02/25 0502) Weight:  [93 kg] 93 kg (02/24 2115) Weight change: 4.287 kg  Intake/Output from previous day: 02/24 0701 - 02/25 0700 In: 720 [P.O.:720] Out: -  Intake/Output this shift: No intake/output data recorded.  General appearance: alert, cooperative and no distress Resp: clear to auscultation bilaterally Cardio: S1, S2 normal and systolic murmur: holosystolic 2/6, blowing at apex GI: soft, non-tender; bowel sounds normal; no masses,  no organomegaly Extremities: AVF LUA  Lab Results: Recent Labs    04/28/18 1034  WBC 5.0  HGB 9.1*  HCT 27.6*  PLT 207   BMET:  Recent Labs    04/30/18 1819 05/01/18 0539  NA 135 132*  K 4.5 5.4*  CL 97* 96*  CO2 25 21*  GLUCOSE 150* 300*  BUN 42* 49*  CREATININE 11.02* 12.09*  CALCIUM 8.7* 8.6*   No results for input(s): PTH in the last 72 hours. Iron Studies: No results for input(s): IRON, TIBC, TRANSFERRIN, FERRITIN in the last 72 hours.  Studies/Results: No results found.  I have reviewed the patient's current medications.  Assessment/Plan: 1 ESRD for HD 2 Anemia esa 3 DM per primary 4 HPTH vit D  5Depression inactive P HD, esa, glu control   LOS: 9 days   Edward Colon 05/01/2018,9:05 AM

## 2018-05-01 NOTE — Procedures (Signed)
I was present at this session.  I have reviewed the session itself and made appropriate changes. HD via LUA avf.  Access press ok. tol HD, bp to vol off.   Jeneen Rinks Deshante Cassell 2/25/20209:04 AM

## 2018-05-01 NOTE — Progress Notes (Signed)
PROGRESS NOTE  Edward Colon GEZ:[id number] DOB: November 10, 1980 DOA: 04/22/2018 PCP: Nolene Ebbs, MD   LOS: 9 days   Brief Narrative / Interim history: 38 year old male with history of type 1 diabetes, ESRD on hemodialysis, history of nonadherence with prior episodes of DKA as well as severe hyperkalemia in January 2019 resulting in cardiac arrest, hypertension, seizure disorder, presented to the hospital on 2/16 with DKA.  This was in the setting of patient running out of his home insulin for the past 7 days.  He also had nausea and vomiting.  He was found to be significantly hyperkalemic, his CBG was in the 1600s and he was in DKA, and was transferred to [facility], ICU for emergent dialysis.  He improved and was transferred to the hospitalist service on 2/21, however on 2/22 patient became significantly hyperglycemic again, was drowsy, he was hyperkalemic with a potassium of 7.3 and had EKG changes.  His anion gap was 15, and he was transferred back to the ICU for close monitoring and management.  He is being transferred back to the hospitalist service on 2/24.  Subjective: Seen in dialysis, without complaints.  No nausea or vomiting, no chest pain, no shortness of breath.  Assessment & Plan: Principal Problem:   Depression Active Problems:   Hyperkalemia   Hyperglycemia   MDD (major depressive disorder), recurrent severe, without psychosis ([place])   Acute encephalopathy  Principal Problem Type 1 diabetes mellitus, DKA on admission, brittle diabetes -In the setting of nonadherence, ran out of insulin several days prior.  He does not have a PCP nor endocrinology.  He was initially treated with IV insulin drip, and was transferred to ICU due to significant hyperkalemia. -He was transferred to hospitalist service on 2/21 but bounced back to the ICU on 2/22. -Transferred again to the hospitalist service on 2/24 -His CBGs yesterday were quite wild, lunchtime was mid 300s and after  receiving sliding scale coverage he had an episode of hypoglycemia with CBGs in the 50s few hours later. -Given elevated fasting CBG this morning, I have increased his Lantus to 18 units and reduced his moderate sliding scale to low-dose sliding scale.   -At home, he usually gets 3 units sliding scale for CBGs greater than 200, 4 units for greater than 300, 5 units for greater than 400, 10 units for greater than 500 -If no longer has hypoglycemic episodes today anticipate discharge home on Wednesday  Active Problems Severe hyperkalemia with EKG changes -Potassium on admission was greater than 7.5, and he had significant EKG changes.  He was dialyzed emergently and placed in ICU at [facility]. -Potassium this morning is slightly elevated 5.4, he is getting dialyzed -Repeat BMP tomorrow  Acute toxic metabolic encephalopathy -Likely in the setting of DKA and contributing uremia, alert and oriented x4 and this appears resolved this morning.  Given bounce back to the ICU just couple of days ago will closely monitor  ESRD on TTS HD -Nephrology following, undergoing HD today on 2/25  Hypertension -His blood pressures were soft and he is antihypertensives were titrated down, now back on BiDil and nifedipine.  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  Continue   Scheduled Meds: . calcium acetate  2,001 mg Oral TID WC  . Chlorhexidine Gluconate Cloth  6 each Topical Q0600  . darbepoetin (ARANESP)  injection - DIALYSIS  150 mcg Intravenous Q Tue-HD  . FLUoxetine  20 mg Oral Daily  . heparin      . heparin  5,000 Units Subcutaneous Q8H  . insulin aspart  0-5 Units Subcutaneous QHS  . insulin aspart  0-9 Units Subcutaneous TID WC  . insulin glargine  18 Units Subcutaneous Daily    . isosorbide-hydrALAZINE  1 tablet Oral TID  . levETIRAcetam  250 mg Oral Q T,Th,Sat-1800  . levETIRAcetam  500 mg Oral Daily  . multivitamin  1 tablet Oral QHS  . NIFEdipine  30 mg Oral QHS  . risperiDONE  1 mg Oral BID   Continuous Infusions: . sodium chloride    . sodium chloride    . sodium chloride    . dextrose 5 % and 0.45% NaCl Stopped (04/28/18 1644)   PRN Meds:.sodium chloride, sodium chloride, dextrose, docusate sodium, heparin, heparin, hydrALAZINE, Influenza vac split quadrivalent PF, lidocaine (PF), lidocaine-prilocaine, naLOXone (NARCAN)  injection, pentafluoroprop-tetrafluoroeth, pneumococcal 23 valent vaccine  DVT prophylaxis: Heparin Code Status: Full code Family Communication: No family present at bedside Disposition Plan: Home when ready  Consultants:   PCCM  Nephrology  Psychiatry  Procedures:  HD  Antimicrobials:  None    Objective: Vitals:   05/01/18 0900 05/01/18 0930 05/01/18 1000 05/01/18 1030  BP: (!) 167/87 (!) 174/91 135/65 123/62  Pulse: 91 95 100 (!) 101  Resp:      Temp:      TempSrc:      SpO2:      Weight:      Height:        Intake/Output Summary (Last 24 hours) at 05/01/2018 1040 Last data filed at 05/01/2018 0900 Gross per 24 hour  Intake 600 ml  Output -  Net 600 ml   Filed Weights   04/30/18 0500 04/30/18 2115 05/01/18 0710  Weight: 88.7 kg 93 kg 93.8 kg    Examination:  Constitutional: Is in no distress Eyes: No scleral icterus noted ENMT: Moist mucous membranes Neck: normal, supple Respiratory: Clear to auscultation bilaterally without wheezing or crackles Cardiovascular: Regular rate and rhythm, no murmurs appreciated.  No peripheral edema Abdomen: Soft, nontender, nondistended, positive bowel sounds Musculoskeletal: no clubbing / cyanosis.  Skin: No rashes appreciated Neurologic: Grossly nonfocal   Data Reviewed: I have independently reviewed following labs and imaging studies   CBC: Recent Labs   Lab Apr 28, 2018 0348 04/26/18 1403 04/27/18 0458 04/28/18 1034 05/01/18 0710  WBC 4.8 5.2 4.9 5.0 3.3*  NEUTROABS  --   --  2.8  --   --   HGB 9.7* 9.0* 8.7* 9.1* 8.2*  HCT 29.2* 27.8* 26.4* 27.6* 24.9*  MCV 85.4 84.5 84.6 84.4 86.2  PLT 205 192 188 207 161   Basic Metabolic Panel: Recent Labs  Lab 04/24/18 1140  04/26/18 1404  04/28/18 1034  04/29/18 1553 04/29/18 2226 04/30/18 0405 04/30/18 1819 05/01/18 0539  NA 135   < > 132*   < > 130*   < > 137 128* 136 135 132*  K 4.5   < > 3.7   < > 4.3   < > 4.3 6.0* 4.1 4.5 5.4*  CL 94*   < > 92*   < > 92*   < > 100 92* 97* 97* 96*  CO2 24   < > 24   < > 22   < > 26 25 27 25  21*  GLUCOSE 320*   < > 151*   < >  563*   < > 30* 490* 246* 150* 300*  BUN 37*   < > 43*   < > 49*   < > 25* 32* 36* 42* 49*  CREATININE 9.57*   < > 11.89*   < > 11.69*   < > 8.34* 8.78* 9.85* 11.02* 12.09*  CALCIUM 8.2*   < > 8.8*   < > 8.3*   < > 9.0 8.1* 8.2* 8.7* 8.6*  PHOS 7.0*  --  2.8  --  2.4*  --   --   --   --   --   --    < > = values in this interval not displayed.   GFR: Estimated Creatinine Clearance: 10 mL/min (A) (by C-G formula based on SCr of 12.09 mg/dL (H)). Liver Function Tests: Recent Labs  Lab 04/24/18 1140 04/25/18 0348 04/26/18 1404 04/28/18 1034  AST  --  32  --   --   ALT  --  21  --   --   ALKPHOS  --  60  --   --   BILITOT  --  1.1  --   --   PROT  --  6.9  --   --   ALBUMIN 3.2* 3.3* 3.4* 3.3*   No results for input(s): LIPASE, AMYLASE in the last 168 hours. Recent Labs  Lab 04/28/18 1034  AMMONIA 26   Coagulation Profile: No results for input(s): INR, PROTIME in the last 168 hours. Cardiac Enzymes: No results for input(s): CKTOTAL, CKMB, CKMBINDEX, TROPONINI in the last 168 hours. BNP (last 3 results) No results for input(s): PROBNP in the last 8760 hours. HbA1C: No results for input(s): HGBA1C in the last 72 hours. CBG: Recent Labs  Lab 04/30/18 1123 04/30/18 1635 04/30/18 1817 04/30/18 2218  05/01/18 0902  GLUCAP 343* 57* 148* 257* 236*   Lipid Profile: No results for input(s): CHOL, HDL, LDLCALC, TRIG, CHOLHDL, LDLDIRECT in the last 72 hours. Thyroid Function Tests: No results for input(s): TSH, T4TOTAL, FREET4, T3FREE, THYROIDAB in the last 72 hours. Anemia Panel: No results for input(s): VITAMINB12, FOLATE, FERRITIN, TIBC, IRON, RETICCTPCT in the last 72 hours. Urine analysis:    Component Value Date/Time   COLORURINE YELLOW 03/14/2018 1135   APPEARANCEUR CLEAR 03/14/2018 1135   LABSPEC 1.014 03/14/2018 1135   PHURINE 8.0 03/14/2018 1135   GLUCOSEU >=500 (A) 03/14/2018 1135   HGBUR NEGATIVE 03/14/2018 1135   BILIRUBINUR NEGATIVE 03/14/2018 1135   KETONESUR NEGATIVE 03/14/2018 1135   PROTEINUR >=300 (A) 03/14/2018 1135   UROBILINOGEN 0.2 10/26/2013 2324   NITRITE NEGATIVE 03/14/2018 1135   LEUKOCYTESUR NEGATIVE 03/14/2018 1135   Sepsis Labs: Invalid input(s): PROCALCITONIN, LACTICIDVEN  Recent Results (from the past 240 hour(s))  MRSA PCR Screening     Status: None   Collection Time: 04/22/18 11:04 PM  Result Value Ref Range Status   MRSA by PCR NEGATIVE NEGATIVE Final    Comment:        The GeneXpert MRSA Assay (FDA approved for NASAL specimens only), is one component of a comprehensive MRSA colonization surveillance program. It is not intended to diagnose MRSA infection nor to guide or monitor treatment for MRSA infections. Performed at [facility], [place] [address] [place], [place] [address]       Radiology Studies: No results found.  Marzetta Board, MD, PhD [facility]  Contact via  [email or web address]  [place] P: [phone]  F: [phone]

## 2018-05-01 NOTE — Progress Notes (Signed)
Inpatient Diabetes Program Recommendations  AACE/ADA: New Consensus Statement on Inpatient Glycemic Control (2015)  Target Ranges:  Prepandial:   less than 140 mg/dL      Peak postprandial:   less than 180 mg/dL (1-2 hours)      Critically ill patients:  140 - 180 mg/dL   Lab Results  Component Value Date   GLUCAP 236 (H) 05/01/2018   HGBA1C 9.1 (H) 02/13/2018    Results for Edward Colon, Edward Colon (MRN [id number]) as of 05/01/2018 10:21  Ref. Range 04/30/2018 04:00 04/30/2018 07:22 04/30/2018 11:23 04/30/2018 16:35 04/30/2018 18:17 04/30/2018 22:18 05/01/2018 09:02  Glucose-Capillary Latest Ref Range: 70 - 99 mg/dL 264 (H) 166 (H) 343 (H) 57 (L) 148 (H) 257 (H) 236 (H)     Review of Glycemic Control  Diabetes history:DM1 Outpatient Diabetes medications:Lantus 14 units daily + Novolog 5 units tid meal coverage + Novolog 0-9 units tid correction Current orders for Inpatient glycemic control:Levemir 18 units units daily (to start this am an increase from 14 units)                                                           Novolog Sensitive Correction (0-9 units) units tid (decreased today from Novolog moderate (0-15 units) tid    Patient is type 1 diabetic and as making no insulin needs basal, correction, and meal coverage.   Due to renal function, patient appears to be "stacking" insulin. Patient has hyperglycemia, receives large doses of Novolog and CBG drops within 6-8 hours after initiation of Novolog.     MD please consider the following inpatient insulin adjustments:   1. Decrease Levemir back to 14 units daily.   2. Add Novolog 3 units meal coverage tid. (Please add the following hold parameters:  Hold if NPO or if patient eats less than 50% of meal.)    3.  Decrease current sensitive (0-9 units) correction scale to a Custom Novolog scale (1-5 units) starting at 150 mg/dl every 6 hours. Per RN notes patient seems to be very hungry as blood sugar is  dropping.  Consider ordering: -Custom Novolog correction scale 0-5 units Q6 hours      151-200  1 unit      201-250  2 units      251-300  3 units      301-350  4 units      351-400  5 units   Thank you.   -- Will follow during hospitalization.--  Jonna Clark RN, MSN Diabetes Coordinator Inpatient Glycemic Control Team Team Pager: [phone] (8am-5pm)

## 2018-05-02 DIAGNOSIS — D631 Anemia in chronic kidney disease: Secondary | ICD-10-CM

## 2018-05-02 DIAGNOSIS — E1069 Type 1 diabetes mellitus with other specified complication: Secondary | ICD-10-CM

## 2018-05-02 DIAGNOSIS — E109 Type 1 diabetes mellitus without complications: Secondary | ICD-10-CM

## 2018-05-02 DIAGNOSIS — G9341 Metabolic encephalopathy: Secondary | ICD-10-CM

## 2018-05-02 DIAGNOSIS — I10 Essential (primary) hypertension: Secondary | ICD-10-CM

## 2018-05-02 DIAGNOSIS — E871 Hypo-osmolality and hyponatremia: Secondary | ICD-10-CM

## 2018-05-02 DIAGNOSIS — N186 End stage renal disease: Secondary | ICD-10-CM

## 2018-05-02 LAB — BASIC METABOLIC PANEL
Anion gap: 11 (ref 5–15)
BUN: 41 mg/dL — ABNORMAL HIGH (ref 6–20)
CO2: 25 mmol/L (ref 22–32)
Calcium: 8.3 mg/dL — ABNORMAL LOW (ref 8.9–10.3)
Chloride: 96 mmol/L — ABNORMAL LOW (ref 98–111)
Creatinine, Ser: 7.73 mg/dL — ABNORMAL HIGH (ref 0.61–1.24)
GFR calc Af Amer: 9 mL/min — ABNORMAL LOW (ref >60)
GFR calc non Af Amer: 8 mL/min — ABNORMAL LOW (ref >60)
Glucose, Bld: 186 mg/dL — ABNORMAL HIGH (ref 70–99)
Potassium: 5.3 mmol/L — ABNORMAL HIGH (ref 3.5–5.1)
Sodium: 132 mmol/L — ABNORMAL LOW (ref 135–145)

## 2018-05-02 LAB — GLUCOSE, CAPILLARY
GLUCOSE-CAPILLARY: 22 mg/dL — AB (ref 70–99)
Glucose-Capillary: 148 mg/dL — ABNORMAL HIGH (ref 70–99)
Glucose-Capillary: 357 mg/dL — ABNORMAL HIGH (ref 70–99)
Glucose-Capillary: 133 mg/dL — ABNORMAL HIGH (ref 70–99)
Glucose-Capillary: 71 mg/dL (ref 70–99)

## 2018-05-02 LAB — CBC
HCT: 23.6 % — ABNORMAL LOW (ref 39.0–52.0)
Hemoglobin: 7.8 g/dL — ABNORMAL LOW (ref 13.0–17.0)
MCH: 28.1 pg (ref 26.0–34.0)
MCHC: 33.1 g/dL (ref 30.0–36.0)
MCV: 84.9 fL (ref 80.0–100.0)
Platelets: 220 10*3/uL (ref 150–400)
RBC: 2.78 MIL/uL — ABNORMAL LOW (ref 4.22–5.81)
RDW: 14.2 % (ref 11.5–15.5)
WBC: 3.4 10*3/uL — ABNORMAL LOW (ref 4.0–10.5)
nRBC: 0 % (ref 0.0–0.2)

## 2018-05-02 MED ORDER — INSULIN GLARGINE 100 UNIT/ML ~~LOC~~ SOLN: 20.0000 [IU] | Freq: Every day | SUBCUTANEOUS | Status: DC

## 2018-05-02 MED ORDER — INSULIN GLARGINE 100 UNIT/ML ~~LOC~~ SOLN
25.0000 [IU] | Freq: Every day | SUBCUTANEOUS | Status: DC
  Administered 2018-05-02 – 2018-05-03 (×2): 25 [IU] via SUBCUTANEOUS
  Filled 2018-05-02 (×2): qty 0.25

## 2018-05-02 MED ORDER — CHLORHEXIDINE GLUCONATE CLOTH 2 % EX PADS: 6.0000 | MEDICATED_PAD | Freq: Every day | CUTANEOUS | Status: DC

## 2018-05-02 NOTE — Progress Notes (Signed)
Nutrition Brief Note  RD consulted to order snacks between meals.  Snacks order TID between meals via [place].  Wt Readings from Last 15 Encounters:  05/02/18 90.8 kg  03/27/18 87.3 kg  02/15/18 90.4 kg  05/08/17 90.7 kg  03/08/17 89.9 kg  01/15/17 90.7 kg  12/14/16 91.2 kg  11/17/16 88.5 kg  04/29/16 95.3 kg  04/11/16 95.3 kg  01/27/16 89.8 kg  01/08/16 90.7 kg  11/06/15 85.3 kg  10/22/15 85.4 kg  09/01/15 83.9 kg    Body mass index is 27.92 kg/m. Patient meets criteria for overweight based on current BMI.   Current diet order is Renal with 1200 ml fluid restriction, patient is consuming approximately 75% of meals at this time. Labs and medications reviewed.   No nutrition interventions warranted at this time. If nutrition issues arise, please consult RD.   Gaynell Face, MS, RD, LDN Inpatient Clinical Dietitian Pager: [phone] Weekend/After Hours: [phone]

## 2018-05-02 NOTE — Progress Notes (Addendum)
[facility] Progress Note   Subjective:   Patient seen in room. Feeling well today, no c/o. Denies CP, SOB/dyspnea, edema, N/V/D. Hoping to discharge today.   Objective Vitals:   05/01/18 1133 05/01/18 2128 05/02/18 0341 05/02/18 0500  BP: (!) 141/80 (!) 158/95 122/87   Pulse: (!) 101 92 82   Resp: 18 20 18    Temp: 97.8 F (36.6 C) 98.3 F (36.8 C) 97.6 F (36.4 C)   TempSrc: Oral Oral Oral   SpO2: 98% 100% 98%   Weight: 90.7 kg 90.8 kg  90.8 kg  Height:       Physical Exam General: Well developed, well nourished male in NAD Heart: RRR,Gr 2/6 M no rubs or gallops Lungs: CTA bilaterally without wheezing, rhonchi or rales Abdomen: Soft, non-tender, non-distended. No palpable masses. + BS Liver down 5 cm Extremities: No edema b/l lower extremities.  Dialysis Access: LUE AVF, + thrill and bruit  Additional Objective Labs: Basic Metabolic Panel: Recent Labs  Lab 04/26/18 1404  04/28/18 1034  05/01/18 0539 05/01/18 1650 05/02/18 0434  NA 132*   < > 130*   < > 132* 130* 132*  K 3.7   < > 4.3   < > 5.4* 5.3* 5.3*  CL 92*   < > 92*   < > 96* 95* 96*  CO2 24   < > 22   < > 21* 25 25  GLUCOSE 151*   < > 563*   < > 300* 425* 186*  BUN 43*   < > 49*   < > 49* 26* 41*  CREATININE 11.89*   < > 11.69*   < > 12.09* 6.36* 7.73*  CALCIUM 8.8*   < > 8.3*   < > 8.6* 8.3* 8.3*  PHOS 2.8  --  2.4*  --   --   --   --    < > = values in this interval not displayed.   Liver Function Tests: Recent Labs  Lab 04/26/18 1404 04/28/18 1034  ALBUMIN 3.4* 3.3*   CBC: Recent Labs  Lab 04/26/18 1403 04/27/18 0458 04/28/18 1034 05/01/18 0710 05/02/18 0434  WBC 5.2 4.9 5.0 3.3* 3.4*  NEUTROABS  --  2.8  --   --   --   HGB 9.0* 8.7* 9.1* 8.2* 7.8*  HCT 27.8* 26.4* 27.6* 24.9* 23.6*  MCV 84.5 84.6 84.4 86.2 84.9  PLT 192 188 207 240 220   Blood Culture    Component Value Date/Time   SDES TRACHEAL ASPIRATE 03/15/2018 0738   SPECREQUEST NONE 03/15/2018 0738   CULT  MODERATE Consistent with normal respiratory flora. 03/15/2018 0738   REPTSTATUS 03/17/2018 FINAL 03/15/2018 0738    CBG: Recent Labs  Lab 05/01/18 0902 05/01/18 1201 05/01/18 1645 05/01/18 2129 05/02/18 0735  GLUCAP 236* 385* 374* 110* 148*   Medications: . sodium chloride    . dextrose 5 % and 0.45% NaCl Stopped (04/28/18 1644)   . calcium acetate  2,001 mg Oral TID WC  . Chlorhexidine Gluconate Cloth  6 each Topical Q0600  . darbepoetin (ARANESP) injection - DIALYSIS  150 mcg Intravenous Q Tue-HD  . FLUoxetine  20 mg Oral Daily  . heparin  5,000 Units Subcutaneous Q8H  . insulin aspart  0-5 Units Subcutaneous QHS  . insulin aspart  0-9 Units Subcutaneous TID WC  . insulin glargine  25 Units Subcutaneous Daily  . isosorbide-hydrALAZINE  1 tablet Oral TID  . levETIRAcetam  250 mg Oral Q T,Th,Sat-1800  .  levETIRAcetam  500 mg Oral Daily  . multivitamin  1 tablet Oral QHS  . NIFEdipine  30 mg Oral QHS  . risperiDONE  1 mg Oral BID    Dialysis Orders: TTS SW 4h 44min 88.5kg 2/2.25 bath 500/800 LUA AVG Hep 5000 - mircera 60 every 2 wks - venofer 50 /wk - cinacalcet 90 mg po tiw   Assessment/Plan: 1. Severe hyperkalemia: In setting of hyperglycemia with K+ 7.3. Given calcium gluconate, insulin, bicarb. K now 5.3. Cause with Marshia Ly with cellular shift, not true ^ K 2. ESRD: on HD TTS. Will plan for HD tomorrow per regular schedule if still admitted. Lower vol 3. T1DM: Very brittle. Off of IV insulin. Continue insulin per primary team 4. Anemia: Hgb 7.8. Given aranesp 150 mcg 05/01/2018. Follow, may need increased ESA with next dose.  5. HTN/Volume: BP meds previously reduced due to soft BP. No edema on exam. Above EDW, plan for HD tomorrow per schedule with UFG 3L.Stop  bidil 6. Secondary hyperparathyroidism: Ca 8.3, corrected 8.9. Continue sensipar and binders.  7. Seizures - on last admit, continue outpatient keppra 8. Depression: On prozac, management per  psych/primary.  9.    Pseudo-hyponatremia: in setting of hyperglycemia. :  Na stable, 132. Some dilution  Anice Paganini, PA-C 05/02/2018, 10:01 AM  [facility] Pager: [phone] I have seen and examined this patient and agree with the plan of care seen, eval, examiined, counseled, discussed with PA .  Jeneen Rinks Pebbles Zeiders 05/02/2018, 11:24 AM

## 2018-05-02 NOTE — Progress Notes (Signed)
[facility] PROGRESS NOTE    Progress Note  [name]  UKG:[id number] DOB: February 06, 1981 DOA: 04/22/2018 PCP: Nolene Ebbs, MD     Brief Narrative:   Edward Colon is an 38 y.o. male  with history of type 1 diabetes, ESRD on hemodialysis, history of nonadherence with prior episodes of DKA as well as severe hyperkalemia in January 2019 resulting in cardiac arrest, hypertension, seizure disorder, presented to the hospital on 2/16 with DKA.  This was in the setting of patient running out of his home insulin for the past 7 days.  He also had nausea and vomiting.  He was found to be significantly hyperkalemic, his CBG was in the 1600s and he was in DKA, and was transferred to [facility], ICU for emergent dialysis.  He improved and was transferred to the hospitalist service on 2/21, however on 2/22 patient became significantly hyperglycemic again, was drowsy, he was hyperkalemic with a potassium of 7.3 and had EKG changes.  His anion gap was 15, and he was transferred back to the ICU for close monitoring and management.  He is being transferred back to the hospitalist service on 2/24.   Assessment/Plan:   Type 1 diabetes mellitus with other specified complication (HCC)/DKA: In the setting of nonadherence with his medication. Treated with IV insulin transfer to the ICU due to significant hyperkalemia transferred back to the hospitalist on 02/25/2019 and bounced back to the ICU on 02/26/2019. Transfer back to [facility] on 02/28/2019. very erratic blood glucose. Will make his 3 times daily's meal smaller with snacks in between. Increase his long-acting insulin to his home dose of 25 units daily.  Near hyperkalemia with EKG changes: On admission his potassium was greater than 7.5 he was dialyzed emergently potassium has improved.  Acute metabolic encephalopathy: Likely due to DKA questionable uremia.  End stage stage renal disease on  hemodialysis: Nephrology's assistance continue hemodialysis Tuesday Thursdays and Saturdays.  Essential hypertension: His BiDil was decreased as well as his nifedipine. Due to low blood pressure.  Proving remains at goal.  Seizure disorder None in house continue [place].  Pseudohyponatremia: Likely due to hyperglycemia.  Anemia of end-stage renal disease: Hemoglobin has been stable.  Depression: Seen by psychiatrist on 04/24/2018: Continue risperidone and fluoxetine.   DVT prophylaxis: lovenox Family Communication:none Disposition Plan/Barrier to D/C: home in am Code Status:     Code Status Orders  (From admission, onward)         Start     Ordered   04/22/18 2249  Full code  Continuous     04/22/18 2252        Code Status History    Date Active Date Inactive Code Status Order ID Comments User Context   04/22/2018 2248 04/22/2018 2253 Full Code [id number]  Rhodia Albright, NP Inpatient   04/22/2018 2246 04/22/2018 2248 Full Code [id number]  Caren Griffins, MD Inpatient   03/28/2018 2137 04/11/2018 2149 Full Code [id number]  [name], MD Inpatient   03/28/2018 2137 03/28/2018 2137 Full Code [id number]  [name], MD Inpatient   03/14/2018 1115 03/28/2018 2134 Full Code [id number]  Lorella Nimrod, MD ED   02/08/2018 1505 02/15/2018 2030 Full Code [id number]  Marijean Heath, NP ED   03/05/2017 0952 03/06/2017 0833 Full Code [id number]  Etheleen Nicks, MD ED   01/28/2016 2318 02/03/2016 2248 Full Code [id number]  Norval Morton, MD ED   02/27/2015 0655 03/07/2015 1850 Full Code [id number]  Loleta Books, Harrell Gave  P, MD ED   08/30/2014 2133 09/02/2014 1449 Full Code [id number]  Theressa Millard, MD Inpatient   05/31/2013 1413 06/04/2013 1834 Full Code [id number]  Thurnell Lose, MD Inpatient        IV Access:    Peripheral IV   Procedures and diagnostic studies:   No results found.   Medical Consultants:    None.  Anti-Infectives:    None  Subjective:    Edward Colon complaints willing to try smaller meals and agrees with plan to going back to his home dose insulin.  Objective:    Vitals:   05/01/18 1133 05/01/18 2128 05/02/18 0341 05/02/18 0500  BP: (!) 141/80 (!) 158/95 122/87   Pulse: (!) 101 92 82   Resp: 18 20 18    Temp: 97.8 F (36.6 C) 98.3 F (36.8 C) 97.6 F (36.4 C)   TempSrc: Oral Oral Oral   SpO2: 98% 100% 98%   Weight: 90.7 kg 90.8 kg  90.8 kg  Height:        Intake/Output Summary (Last 24 hours) at 05/02/2018 0926 Last data filed at 05/01/2018 2200 Gross per 24 hour  Intake 720 ml  Output 3000 ml  Net -2280 ml   Filed Weights   05/01/18 1133 05/01/18 2128 05/02/18 0500  Weight: 90.7 kg 90.8 kg 90.8 kg    Exam: General exam: In no acute distress. Respiratory system: Good air movement and clear to auscultation. Cardiovascular system: S1 & S2 heard, RRR.  Gastrointestinal system: Abdomen is nondistended, soft and nontender.  Central nervous system: Alert and oriented. No focal neurological deficits. Extremities: No pedal edema. Skin: No rashes, lesions or ulcers Psychiatry: Judgement and insight appear normal. Mood & affect appropriate.    Data Reviewed:    Labs: Basic Metabolic Panel: Recent Labs  Lab 04/26/18 1404  04/28/18 1034  04/30/18 0405 04/30/18 1819 05/01/18 0539 05/01/18 1650 05/02/18 0434  NA 132*   < > 130*   < > 136 135 132* 130* 132*  K 3.7   < > 4.3   < > 4.1 4.5 5.4* 5.3* 5.3*  CL 92*   < > 92*   < > 97* 97* 96* 95* 96*  CO2 24   < > 22   < > 27 25 21* 25 25  GLUCOSE 151*   < > 563*   < > 246* 150* 300* 425* 186*  BUN 43*   < > 49*   < > 36* 42* 49* 26* 41*  CREATININE 11.89*   < > 11.69*   < > 9.85* 11.02* 12.09* 6.36* 7.73*  CALCIUM 8.8*   < > 8.3*   < > 8.2* 8.7* 8.6* 8.3* 8.3*  PHOS 2.8  --  2.4*  --   --   --   --   --   --    < > = values in this interval not displayed.   GFR Estimated Creatinine Clearance: 15.4 mL/min (A)  (by C-G formula based on SCr of 7.73 mg/dL (H)). Liver Function Tests: Recent Labs  Lab 04/26/18 1404 04/28/18 1034  ALBUMIN 3.4* 3.3*   No results for input(s): LIPASE, AMYLASE in the last 168 hours. Recent Labs  Lab 04/28/18 1034  AMMONIA 26   Coagulation profile No results for input(s): INR, PROTIME in the last 168 hours.  CBC: Recent Labs  Lab 04/26/18 1403 04/27/18 0458 04/28/18 1034 05/01/18 0710 05/02/18 0434  WBC 5.2 4.9 5.0 3.3* 3.4*  NEUTROABS  --  2.8  --   --   --   HGB 9.0* 8.7* 9.1* 8.2* 7.8*  HCT 27.8* 26.4* 27.6* 24.9* 23.6*  MCV 84.5 84.6 84.4 86.2 84.9  PLT 192 188 207 240 220   Cardiac Enzymes: No results for input(s): CKTOTAL, CKMB, CKMBINDEX, TROPONINI in the last 168 hours. BNP (last 3 results) No results for input(s): PROBNP in the last 8760 hours. CBG: Recent Labs  Lab 05/01/18 0902 05/01/18 1201 05/01/18 1645 05/01/18 2129 05/02/18 0735  GLUCAP 236* 385* 374* 110* 148*   D-Dimer: No results for input(s): DDIMER in the last 72 hours. Hgb A1c: No results for input(s): HGBA1C in the last 72 hours. Lipid Profile: No results for input(s): CHOL, HDL, LDLCALC, TRIG, CHOLHDL, LDLDIRECT in the last 72 hours. Thyroid function studies: No results for input(s): TSH, T4TOTAL, T3FREE, THYROIDAB in the last 72 hours.  Invalid input(s): FREET3 Anemia work up: No results for input(s): VITAMINB12, FOLATE, FERRITIN, TIBC, IRON, RETICCTPCT in the last 72 hours. Sepsis Labs: Recent Labs  Lab 04/27/18 0458 04/28/18 1034 05/01/18 0710 05/02/18 0434  WBC 4.9 5.0 3.3* 3.4*   Microbiology Recent Results (from the past 240 hour(s))  MRSA PCR Screening     Status: None   Collection Time: 04/22/18 11:04 PM  Result Value Ref Range Status   MRSA by PCR NEGATIVE NEGATIVE Final    Comment:        The GeneXpert MRSA Assay (FDA approved for NASAL specimens only), is one component of a comprehensive MRSA colonization surveillance program. It is  not intended to diagnose MRSA infection nor to guide or monitor treatment for MRSA infections. Performed at [facility], [place] [address], [place], [place] [address]      Medications:   . calcium acetate  2,001 mg Oral TID WC  . Chlorhexidine Gluconate Cloth  6 each Topical Q0600  . darbepoetin (ARANESP) injection - DIALYSIS  150 mcg Intravenous Q Tue-HD  . FLUoxetine  20 mg Oral Daily  . heparin  5,000 Units Subcutaneous Q8H  . insulin aspart  0-5 Units Subcutaneous QHS  . insulin aspart  0-9 Units Subcutaneous TID WC  . insulin glargine  16 Units Subcutaneous Daily  . isosorbide-hydrALAZINE  1 tablet Oral TID  . levETIRAcetam  250 mg Oral Q T,Th,Sat-1800  . levETIRAcetam  500 mg Oral Daily  . multivitamin  1 tablet Oral QHS  . NIFEdipine  30 mg Oral QHS  . risperiDONE  1 mg Oral BID   Continuous Infusions: . sodium chloride    . dextrose 5 % and 0.45% NaCl Stopped (04/28/18 1644)      LOS: 10 days   Charlynne Cousins  [facility]  05/02/2018, 9:26 AM

## 2018-05-03 LAB — CBC
HCT: 27.1 % — ABNORMAL LOW (ref 39.0–52.0)
Hemoglobin: 8.9 g/dL — ABNORMAL LOW (ref 13.0–17.0)
MCH: 27.9 pg (ref 26.0–34.0)
MCHC: 32.8 g/dL (ref 30.0–36.0)
MCV: 85 fL (ref 80.0–100.0)
Platelets: 280 10*3/uL (ref 150–400)
RBC: 3.19 MIL/uL — AB (ref 4.22–5.81)
RDW: 13.6 % (ref 11.5–15.5)
WBC: 4 10*3/uL (ref 4.0–10.5)
nRBC: 0 % (ref 0.0–0.2)

## 2018-05-03 LAB — GLUCOSE, CAPILLARY
GLUCOSE-CAPILLARY: 243 mg/dL — AB (ref 70–99)
Glucose-Capillary: 186 mg/dL — ABNORMAL HIGH (ref 70–99)

## 2018-05-03 LAB — RENAL FUNCTION PANEL
Albumin: 3.3 g/dL — ABNORMAL LOW (ref 3.5–5.0)
Anion gap: 14 (ref 5–15)
BUN: 58 mg/dL — ABNORMAL HIGH (ref 6–20)
CHLORIDE: 99 mmol/L (ref 98–111)
CO2: 22 mmol/L (ref 22–32)
Calcium: 8.8 mg/dL — ABNORMAL LOW (ref 8.9–10.3)
Creatinine, Ser: 10.4 mg/dL — ABNORMAL HIGH (ref 0.61–1.24)
GFR calc Af Amer: 7 mL/min — ABNORMAL LOW (ref >60)
GFR calc non Af Amer: 6 mL/min — ABNORMAL LOW (ref >60)
Glucose, Bld: 76 mg/dL (ref 70–99)
Phosphorus: 4.9 mg/dL — ABNORMAL HIGH (ref 2.5–4.6)
Potassium: 4.7 mmol/L (ref 3.5–5.1)
Sodium: 135 mmol/L (ref 135–145)

## 2018-05-03 MED ORDER — INSULIN ASPART 100 UNIT/ML ~~LOC~~ SOLN
0.0000 [IU] | Freq: Three times a day (TID) | SUBCUTANEOUS | Status: DC
  Administered 2018-05-03: 3 [IU] via SUBCUTANEOUS

## 2018-05-03 MED ORDER — INSULIN LISPRO 100 UNIT/ML ~~LOC~~ SOLN: SUBCUTANEOUS | 3 refills | Status: AC

## 2018-05-03 MED ORDER — INSULIN GLARGINE 100 UNIT/ML ~~LOC~~ SOLN: 10.0000 [IU] | Freq: Two times a day (BID) | SUBCUTANEOUS | 11 refills | Status: AC

## 2018-05-03 MED ORDER — INSULIN LISPRO 100 UNIT/ML ~~LOC~~ SOLN: SUBCUTANEOUS | 3 refills | Status: DC

## 2018-05-03 MED ORDER — INSULIN ASPART 100 UNIT/ML ~~LOC~~ SOLN
3.0000 [IU] | Freq: Three times a day (TID) | SUBCUTANEOUS | Status: DC
  Administered 2018-05-03: 3 [IU] via SUBCUTANEOUS

## 2018-05-03 MED ORDER — HEPARIN SODIUM (PORCINE) 1000 UNIT/ML DIALYSIS: 20.0000 [IU]/kg | INTRAMUSCULAR | Status: DC | PRN

## 2018-05-03 NOTE — Care Management Note (Signed)
Case Management Note  Patient Details  Name: Edward Colon MRN: [id number] Date of Birth: 1980-07-01  Subjective/Objective:      Depression               Action/Plan: Primary RN requested that CM talk to patient about replacing Medicare and Medicaid cards. Spoke with patient and father at bedside. Patient gave permission to speak in front of father. Patient asking how to get new Medicaid and Medicare cards. Discussed going to [facility].gov to create an account if not already created in order to request new card. Advised to contact local DSS to request new Medicaid card. Patient and father expressed appreciation. No other transition of care needs identified.   Expected Discharge Date:  05/03/18               Expected Discharge Plan:  Home/Self Care  In-House Referral:  NA  Discharge planning Services  CM Consult, Follow-up appt scheduled  Post Acute Care Choice:  NA Choice offered to:  Patient  DME Arranged:  N/A DME Agency:  NA  HH Arranged:  NA HH Agency:  NA  Status of Service:  Completed, signed off  If discussed at [place] of Stay Meetings, dates discussed:    Additional Comments:  Bartholomew Crews, RN 05/03/2018, 3:22 PM

## 2018-05-03 NOTE — Procedures (Signed)
I was present at this session.  I have reviewed the session itself and made appropriate changes.HD via LUA aVF , bp ^, tol vol off, reassess meds after.  Access press ok. tol well Mauricia Area 2/27/20208:22 AM

## 2018-05-03 NOTE — Progress Notes (Addendum)
Inpatient Diabetes Program Recommendations  AACE/ADA: New Consensus Statement on Inpatient Glycemic Control (2015)  Target Ranges:  Prepandial:   less than 140 mg/dL      Peak postprandial:   less than 180 mg/dL (1-2 hours)      Critically ill patients:  140 - 180 mg/dL   Results for DANE, KOPKE (MRN [id number]) as of 05/03/2018 09:37  Ref. Range 05/02/2018 07:35 05/02/2018 11:15 05/02/2018 16:21 05/02/2018 21:41 05/02/2018 22:10 05/03/2018 02:15  Glucose-Capillary Latest Ref Range: 70 - 99 mg/dL 148 (H) 357 (H) 133 (H) 22 (LL) 71 186 (H)   Review of Glycemic Control  Diabetes history:DM1 Outpatient Diabetes medications:Lantus 14 units daily + Novolog 5 units tid meal coverage + Novolog 0-9 units tid correction Current orders for Inpatient glycemic control: Levemir 25 units Daily + Novolog Moderate Correction 0-9 units tid  Inpatient Diabetes Program Recommendations:    Patient holds onto insulin for a significantly long time. Patient has hyperglycemia, receives large doses of Novolog and drops within 6-8 hours after initiation of Novolog.   -  Recommend patient having a reduced dose of Lantus 16 units  -  Consider ordering a Custom Novolog scale starting at 150 mg/dl every 6 hours as patient drops with correction scale doses and stacks the insulin.  Consider ordering:  -Custom Novolog correction scale 0-5 units Q6 hours      151-200  1 unit      201-250  2 units      251-300  3 units      301-350  4 units      351-400  5 units  Thanks,  Tama Headings RN, MSN, BC-ADM Inpatient Diabetes Coordinator Team Pager [phone] (8a-5p)

## 2018-05-03 NOTE — Progress Notes (Signed)
Subjective: Interval History: has no complaint, sugars doing better.  Objective: Vital signs in last 24 hours: Temp:  [97.8 F (36.6 C)-98.2 F (36.8 C)] 97.8 F (36.6 C) (02/27 0650) Pulse Rate:  [77-85] 78 (02/27 0730) Resp:  [14-20] 15 (02/27 0730) BP: (153-182)/(87-99) 161/94 (02/27 0730) SpO2:  [98 %-100 %] 100 % (02/27 0650) Weight:  [93.7 kg] 93.7 kg (02/27 0650) Weight change: -0.1 kg  Intake/Output from previous day: 02/26 0701 - 02/27 0700 In: 760 [P.O.:760] Out: 0  Intake/Output this shift: No intake/output data recorded.  General appearance: alert, cooperative and no distress Resp: clear to auscultation bilaterally Cardio: S1, S2 normal and systolic murmur: systolic ejection 2/6, crescendo and decrescendo at 2nd left intercostal space GI: soft, pos bs, nontender, liver down 6 cm Extremities: AVF LUA  Lab Results: Recent Labs    05/02/18 0434 05/03/18 0704  WBC 3.4* 4.0  HGB 7.8* 8.9*  HCT 23.6* 27.1*  PLT 220 280   BMET:  Recent Labs    05/02/18 0434 05/03/18 0704  NA 132* 135  K 5.3* 4.7  CL 96* 99  CO2 25 22  GLUCOSE 186* 76  BUN 41* 58*  CREATININE 7.73* 10.40*  CALCIUM 8.3* 8.8*   No results for input(s): PTH in the last 72 hours. Iron Studies: No results for input(s): IRON, TIBC, TRANSFERRIN, FERRITIN in the last 72 hours.  Studies/Results: No results found.  I have reviewed the patient's current medications.  Assessment/Plan: 1 ESRD for HD, lower solute, vol 2 HTN lower vol 3 DM better 4 Anemia some better, cont esa 5 HPTH vit D 6 Sz P HD, esa, glu control    LOS: 11 days   Jeneen Rinks Freddy Spadafora 05/03/2018,8:05 AM

## 2018-05-03 NOTE — Discharge Summary (Addendum)
Physician Discharge Summary  Edward Colon FVC:[id number] DOB: 19-Jun-1980 DOA: 04/22/2018  PCP: Nolene Ebbs, MD  Admit date: 04/22/2018 Discharge date: 05/03/2018  Admitted From: Home Disposition:  Home  Recommendations for Outpatient Follow-up:  1. Follow up with PCP in 1-2 weeks 2. Follow-up with endocrinology in 2 to 4 weeks  Home Health:No Equipment/Devices:None  Discharge Condition:stable CODE STATUS:Full Diet recommendation: Renal diet   Brief/Interim Summary: with history of type 1 diabetes, ESRD on hemodialysis, history of nonadherence with prior episodes of DKA as well as severe hyperkalemia in January 2019 resulting in cardiac arrest, hypertension, seizure disorder, presented to the hospital on 2/16 with DKA. This was in the setting of patient running out of his home insulin for the past 7 days. He also had nausea and vomiting. 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 He was started on IV insulin and IV fluids admitted to the ICU due to significant hyperkalemia. Transfer back to the hospitalist service on 04/30/2018. His blood glucose remained very erratic. His insulin regimen was changed to long-acting insulin 10 units twice daily with meal coverage 5 units.  New hyperkalemia with EKG  changes Resolved with hemodialysis.  Acute metabolic encephalopathy likely due to DKA questionable uremia now resolved.  End-stage renal disease on hemodialysis. Nephrology was consulted and he was continued on dialysis Tuesday Thursdays and Saturdays.  Essential hypertension: No changes made to his medication blood pressure at goal.  Seizure disorder: Not in house no changes made to his medication continue Keppra.  Pseudohyponatremia: Likely due to hyperglycemia.  Anemia of end-stage renal disease:  Continue Aranesp and IV iron as an outpatient.  Depression: Psychiatry was consulted on 04/24/2018. They recommended to continue risperidone and fluoxetine. Discharge Instructions  Discharge Instructions    Diet - low sodium heart healthy   Complete by:  As directed    Increase activity slowly   Complete by:  As directed      Allergies as of 05/03/2018   No Known Allergies     Medication List    TAKE these medications   calcium acetate 667 MG capsule Commonly known as:  PHOSLO Take 3 capsules (2,001 mg total) by mouth 3 (three) times daily with meals.   cinacalcet 30 MG tablet Commonly known as:  SENSIPAR Take 3 tablets (90 mg total) by mouth Every Tuesday,Thursday,and Saturday with dialysis.   FLUoxetine 20 MG capsule Commonly known as:  PROZAC Take 1 capsule (20 mg total) by mouth daily.   insulin aspart 100 UNIT/ML injection Commonly known as:  novoLOG Inject 0-9 Units into the skin 3 (three) times daily with meals for 30 days. For glucose 121 to 150 use one unit, for 151 to 200 use two units, for 201 to 250 use three units, for 251 to 300 use five units, for 301 to 350  use seven units, for 351 or greater use 9 units.   insulin aspart 100 UNIT/ML injection Commonly known as:  novoLOG Inject 5 Units into the skin 3 (three) times daily with meals.   insulin glargine 100 UNIT/ML injection Commonly known as:  LANTUS Inject 0.1 mLs (10 Units total) into the skin 2  (two) times daily. What changed:  how much to take   isosorbide-hydrALAZINE 20-37.5 MG tablet Commonly known as:  BIDIL Take 1 tablet by mouth 3 (three) times daily.   levETIRAcetam 250 MG tablet Commonly known as:  KEPPRA Take 1 tablet (250 mg total) by mouth every Tuesday, Thursday, and Saturday at 6 PM.   levETIRAcetam 500 MG tablet Commonly known as:  KEPPRA Take 1 tablet (500 mg total) by mouth daily at 6 (six) AM.   minoxidil 2.5 MG tablet Commonly known as:  LONITEN Take 2 tablets (5 mg total) by mouth daily at 6 PM.   multivitamin Tabs tablet Take 1 tablet by mouth at bedtime.   NIFEdipine 30 MG 24 hr tablet Commonly known as:  ADALAT CC Take 1 tablet (30 mg total) by mouth 2 (two) times daily.   risperiDONE 1 MG tablet Commonly known as:  RISPERDAL Take 1 tablet (1 mg total) by mouth 2 (two) times daily.      Follow-up [place]. Go to.   Why:  Your appointment is scheduled for Friday, May 18, 2018 at 2pm to establish a medical home. Please take a photo ID, and your insurance card.  Contact information: [place], [place]         No Known Allergies  Consultations:  PCCM  Nephrology   Procedures/Studies: Dg Chest Portable 1 View  Result Date: 04/12/2018 CLINICAL DATA:  Episode of chest pain. Previous history of cardiac arrest. Dialysis treatment today. EXAM: PORTABLE CHEST 1 VIEW COMPARISON:  03/16/2018 FINDINGS: Cardiac enlargement. No vascular congestion, edema, or consolidation. No blunting of costophrenic angles. No pneumothorax. Mediastinal contours appear intact. IMPRESSION: Cardiac enlargement. No evidence of active pulmonary disease. Electronically Signed   By: Lucienne Capers M.D.   On: 04/12/2018 23:48     Subjective: No complains  Discharge Exam: Vitals:   05/03/18 1030 05/03/18 1103  BP: (!) 164/91 (!) 151/79  Pulse: 83 88  Resp: 13 12  Temp:    SpO2:       General: Pt  is alert, awake, not in acute distress Cardiovascular: RRR, S1/S2 +, no rubs, no gallops Respiratory: CTA bilaterally, no wheezing, no rhonchi Abdominal: Soft, NT, ND, bowel sounds + Extremities: no edema, no cyanosis    The results of significant diagnostics from this hospitalization (including imaging, microbiology, ancillary and laboratory) are listed below for reference.     Microbiology: No results found for this or any previous visit (from the past 240 hour(s)).   Labs: BNP (last 3 results) No results for input(s): BNP in the last 8760 hours. Basic Metabolic Panel: Recent Labs  Lab 04/26/18 1404  04/28/18 1034  04/30/18 1819 05/01/18 0539 05/01/18 1650 05/02/18 0434 05/03/18 0704  NA 132*   < > 130*   < > 135 132* 130* 132* 135  K 3.7   < > 4.3   < > 4.5 5.4* 5.3* 5.3* 4.7  CL 92*   < > 92*   < > 97* 96* 95* 96* 99  CO2 24   < > 22   < > 25 21* 25 25 22  GLUCOSE 151*   < > 563*   < > 150* 300* 425* 186* 76  BUN 43*   < > 49*   < > 42* 49* 26* 41* 58*  CREATININE 11.89*   < > 11.69*   < > 11.02* 12.09* 6.36* 7.73* 10.40*  CALCIUM 8.8*   < > 8.3*   < > 8.7* 8.6* 8.3* 8.3* 8.8*  PHOS 2.8  --  2.4*  --   --   --   --   --  4.9*   < > = values in this interval not displayed.   Liver Function Tests: Recent Labs  Lab 04/26/18 1404 04/28/18 1034 05/03/18 0704  ALBUMIN 3.4* 3.3* 3.3*   No results for input(s): LIPASE, AMYLASE in the last 168 hours. Recent Labs  Lab 04/28/18 1034  AMMONIA 26   CBC: Recent Labs  Lab 04/27/18 0458 04/28/18 1034 05/01/18 0710 05/02/18 0434 05/03/18 0704  WBC 4.9 5.0 3.3* 3.4* 4.0  NEUTROABS 2.8  --   --   --   --   HGB 8.7* 9.1* 8.2* 7.8* 8.9*  HCT 26.4* 27.6* 24.9* 23.6* 27.1*  MCV 84.6 84.4 86.2 84.9 85.0  PLT 188 207 240 220 280   Cardiac Enzymes: No results for input(s): CKTOTAL, CKMB, CKMBINDEX, TROPONINI in the last 168 hours. BNP: Invalid input(s): POCBNP CBG: Recent Labs  Lab 05/02/18 1115 05/02/18 1621  05/02/18 2141 05/02/18 2210 05/03/18 0215  GLUCAP 357* 133* 22* 71 186*   D-Dimer No results for input(s): DDIMER in the last 72 hours. Hgb A1c No results for input(s): HGBA1C in the last 72 hours. Lipid Profile No results for input(s): CHOL, HDL, LDLCALC, TRIG, CHOLHDL, LDLDIRECT in the last 72 hours. Thyroid function studies No results for input(s): TSH, T4TOTAL, T3FREE, THYROIDAB in the last 72 hours.  Invalid input(s): FREET3 Anemia work up No results for input(s): VITAMINB12, FOLATE, FERRITIN, TIBC, IRON, RETICCTPCT in the last 72 hours. Urinalysis    Component Value Date/Time   COLORURINE YELLOW 03/14/2018 1135   APPEARANCEUR CLEAR 03/14/2018 1135   LABSPEC 1.014 03/14/2018 1135   PHURINE 8.0 03/14/2018 1135   GLUCOSEU >=500 (A) 03/14/2018 1135   HGBUR NEGATIVE 03/14/2018 1135   BILIRUBINUR NEGATIVE 03/14/2018 1135   KETONESUR NEGATIVE 03/14/2018 1135   PROTEINUR >=300 (A) 03/14/2018 1135   UROBILINOGEN 0.2 10/26/2013 2324   NITRITE NEGATIVE 03/14/2018 1135   LEUKOCYTESUR NEGATIVE 03/14/2018 1135   Sepsis Labs Invalid input(s): PROCALCITONIN,  WBC,  LACTICIDVEN Microbiology No results found for this or any previous visit (from the past 240 hour(s)).   Time coordinating discharge: 40 minutes  SIGNED:   Charlynne Cousins, MD  [facility]

## 2018-05-03 NOTE — Progress Notes (Addendum)
Inpatient Diabetes Program Recommendations  AACE/ADA: New Consensus Statement on Inpatient Glycemic Control (2015)  Target Ranges:  Prepandial:   less than 140 mg/dL      Peak postprandial:   less than 180 mg/dL (1-2 hours)      Critically ill patients:  140 - 180 mg/dL   Lab Results  Component Value Date   GLUCAP 243 (H) 05/03/2018   HGBA1C 9.1 (H) 02/13/2018    Noted Novolog insulin ordered at discharge. When discussed insulin with patient last Friday he stated his insurance no longer covered Novolog and he needed a prescription for Humalog  (see progress note 2/21). Verified with [facility].  Sent message to Dr. Aileen Fass via [facility] text page and secure chat informing him of above. Also called patient's bedside RN and patient is waiting on ride home. Informed RN I am reaching out to MD and to make patient aware of above.   Update - Insulin changed to Humalog. Called RN and made aware and he will inform patient.   Thank you.  Jonna Clark RN, MSN Diabetes Coordinator Inpatient Glycemic Control Team Team Pager: [phone] (8am-5pm)

## 2018-05-03 NOTE — Progress Notes (Signed)
Edward Colon to be D/C'd Home per MD order.  Discussed prescriptions and follow up appointments with the patient. Prescriptions given to patient, medication list explained in detail. Pt verbalized understanding.  Allergies as of 05/03/2018   No Known Allergies     Medication List    STOP taking these medications   insulin aspart 100 UNIT/ML injection Commonly known as:  novoLOG     TAKE these medications   calcium acetate 667 MG capsule Commonly known as:  PHOSLO Take 3 capsules (2,001 mg total) by mouth 3 (three) times daily with meals.   cinacalcet 30 MG tablet Commonly known as:  SENSIPAR Take 3 tablets (90 mg total) by mouth Every Tuesday,Thursday,and Saturday with dialysis.   FLUoxetine 20 MG capsule Commonly known as:  PROZAC Take 1 capsule (20 mg total) by mouth daily.   insulin glargine 100 UNIT/ML injection Commonly known as:  LANTUS Inject 0.1 mLs (10 Units total) into the skin 2 (two) times daily. What changed:  how much to take   insulin lispro 100 UNIT/ML injection Commonly known as:  HUMALOG 5 units with meals 3 times a day   isosorbide-hydrALAZINE 20-37.5 MG tablet Commonly known as:  BIDIL Take 1 tablet by mouth 3 (three) times daily.   levETIRAcetam 250 MG tablet Commonly known as:  KEPPRA Take 1 tablet (250 mg total) by mouth every Tuesday, Thursday, and Saturday at 6 PM.   levETIRAcetam 500 MG tablet Commonly known as:  KEPPRA Take 1 tablet (500 mg total) by mouth daily at 6 (six) AM.   minoxidil 2.5 MG tablet Commonly known as:  LONITEN Take 2 tablets (5 mg total) by mouth daily at 6 PM.   multivitamin Tabs tablet Take 1 tablet by mouth at bedtime.   NIFEdipine 30 MG 24 hr tablet Commonly known as:  ADALAT CC Take 1 tablet (30 mg total) by mouth 2 (two) times daily.   risperiDONE 1 MG tablet Commonly known as:  RISPERDAL Take 1 tablet (1 mg total) by mouth 2 (two) times daily.       Vitals:   05/03/18 1108 05/03/18 1112   BP: (!) 151/79 (!) 158/80  Pulse: 88 89  Resp: 13 16  Temp: 97.8 F (36.6 C)   SpO2: 100%     Skin clean, dry and intact without evidence of skin break down, no evidence of skin tears noted. IV catheter discontinued intact. Site without signs and symptoms of complications. Dressing and pressure applied. Pt denies pain at this time. No complaints noted.  An After Visit Summary was printed and given to the patient. Patient escorted via [place], and D/C home via private auto.

## 2018-05-05 DIAGNOSIS — N2581 Secondary hyperparathyroidism of renal origin: Secondary | ICD-10-CM | POA: Diagnosis not present

## 2018-05-05 DIAGNOSIS — D509 Iron deficiency anemia, unspecified: Secondary | ICD-10-CM | POA: Diagnosis not present

## 2018-05-05 DIAGNOSIS — N186 End stage renal disease: Secondary | ICD-10-CM | POA: Diagnosis not present

## 2018-05-06 DIAGNOSIS — N186 End stage renal disease: Secondary | ICD-10-CM | POA: Diagnosis not present

## 2018-05-06 DIAGNOSIS — Z992 Dependence on renal dialysis: Secondary | ICD-10-CM | POA: Diagnosis not present

## 2018-05-06 DIAGNOSIS — E1122 Type 2 diabetes mellitus with diabetic chronic kidney disease: Secondary | ICD-10-CM | POA: Diagnosis not present

## 2018-05-08 DIAGNOSIS — D509 Iron deficiency anemia, unspecified: Secondary | ICD-10-CM | POA: Diagnosis not present

## 2018-05-08 DIAGNOSIS — N2581 Secondary hyperparathyroidism of renal origin: Secondary | ICD-10-CM | POA: Diagnosis not present

## 2018-05-08 DIAGNOSIS — D631 Anemia in chronic kidney disease: Secondary | ICD-10-CM | POA: Diagnosis not present

## 2018-05-08 DIAGNOSIS — N186 End stage renal disease: Secondary | ICD-10-CM | POA: Diagnosis not present

## 2018-05-10 DIAGNOSIS — N2581 Secondary hyperparathyroidism of renal origin: Secondary | ICD-10-CM | POA: Diagnosis not present

## 2018-05-10 DIAGNOSIS — D631 Anemia in chronic kidney disease: Secondary | ICD-10-CM | POA: Diagnosis not present

## 2018-05-10 DIAGNOSIS — D509 Iron deficiency anemia, unspecified: Secondary | ICD-10-CM | POA: Diagnosis not present

## 2018-05-10 DIAGNOSIS — N186 End stage renal disease: Secondary | ICD-10-CM | POA: Diagnosis not present

## 2018-05-14 DIAGNOSIS — N186 End stage renal disease: Secondary | ICD-10-CM | POA: Diagnosis not present

## 2018-05-14 DIAGNOSIS — D631 Anemia in chronic kidney disease: Secondary | ICD-10-CM | POA: Diagnosis not present

## 2018-05-14 DIAGNOSIS — D509 Iron deficiency anemia, unspecified: Secondary | ICD-10-CM | POA: Diagnosis not present

## 2018-05-14 DIAGNOSIS — N2581 Secondary hyperparathyroidism of renal origin: Secondary | ICD-10-CM | POA: Diagnosis not present

## 2018-05-15 DIAGNOSIS — N186 End stage renal disease: Secondary | ICD-10-CM | POA: Diagnosis not present

## 2018-05-15 DIAGNOSIS — D631 Anemia in chronic kidney disease: Secondary | ICD-10-CM | POA: Diagnosis not present

## 2018-05-15 DIAGNOSIS — N2581 Secondary hyperparathyroidism of renal origin: Secondary | ICD-10-CM | POA: Diagnosis not present

## 2018-05-15 DIAGNOSIS — D509 Iron deficiency anemia, unspecified: Secondary | ICD-10-CM | POA: Diagnosis not present

## 2018-05-17 DIAGNOSIS — D631 Anemia in chronic kidney disease: Secondary | ICD-10-CM | POA: Diagnosis not present

## 2018-05-17 DIAGNOSIS — N2581 Secondary hyperparathyroidism of renal origin: Secondary | ICD-10-CM | POA: Diagnosis not present

## 2018-05-17 DIAGNOSIS — D509 Iron deficiency anemia, unspecified: Secondary | ICD-10-CM | POA: Diagnosis not present

## 2018-05-17 DIAGNOSIS — N186 End stage renal disease: Secondary | ICD-10-CM | POA: Diagnosis not present

## 2018-05-19 DIAGNOSIS — N186 End stage renal disease: Secondary | ICD-10-CM | POA: Diagnosis not present

## 2018-05-19 DIAGNOSIS — N2581 Secondary hyperparathyroidism of renal origin: Secondary | ICD-10-CM | POA: Diagnosis not present

## 2018-05-22 DIAGNOSIS — N2581 Secondary hyperparathyroidism of renal origin: Secondary | ICD-10-CM | POA: Diagnosis not present

## 2018-05-22 DIAGNOSIS — D631 Anemia in chronic kidney disease: Secondary | ICD-10-CM | POA: Diagnosis not present

## 2018-05-22 DIAGNOSIS — D509 Iron deficiency anemia, unspecified: Secondary | ICD-10-CM | POA: Diagnosis not present

## 2018-05-22 DIAGNOSIS — N186 End stage renal disease: Secondary | ICD-10-CM | POA: Diagnosis not present

## 2018-05-24 DIAGNOSIS — D509 Iron deficiency anemia, unspecified: Secondary | ICD-10-CM | POA: Diagnosis not present

## 2018-05-24 DIAGNOSIS — N186 End stage renal disease: Secondary | ICD-10-CM | POA: Diagnosis not present

## 2018-05-24 DIAGNOSIS — N2581 Secondary hyperparathyroidism of renal origin: Secondary | ICD-10-CM | POA: Diagnosis not present

## 2018-05-24 DIAGNOSIS — D631 Anemia in chronic kidney disease: Secondary | ICD-10-CM | POA: Diagnosis not present

## 2018-05-26 DIAGNOSIS — D631 Anemia in chronic kidney disease: Secondary | ICD-10-CM | POA: Diagnosis not present

## 2018-05-26 DIAGNOSIS — N2581 Secondary hyperparathyroidism of renal origin: Secondary | ICD-10-CM | POA: Diagnosis not present

## 2018-05-26 DIAGNOSIS — N186 End stage renal disease: Secondary | ICD-10-CM | POA: Diagnosis not present

## 2018-05-26 DIAGNOSIS — D509 Iron deficiency anemia, unspecified: Secondary | ICD-10-CM | POA: Diagnosis not present

## 2018-05-29 DIAGNOSIS — N2581 Secondary hyperparathyroidism of renal origin: Secondary | ICD-10-CM | POA: Diagnosis not present

## 2018-05-29 DIAGNOSIS — D509 Iron deficiency anemia, unspecified: Secondary | ICD-10-CM | POA: Diagnosis not present

## 2018-05-29 DIAGNOSIS — N186 End stage renal disease: Secondary | ICD-10-CM | POA: Diagnosis not present

## 2018-05-29 DIAGNOSIS — D631 Anemia in chronic kidney disease: Secondary | ICD-10-CM | POA: Diagnosis not present

## 2018-05-30 ENCOUNTER — Other Ambulatory Visit: Payer: Self-pay | Admitting: Psychiatry

## 2018-05-31 DIAGNOSIS — D631 Anemia in chronic kidney disease: Secondary | ICD-10-CM | POA: Diagnosis not present

## 2018-05-31 DIAGNOSIS — D509 Iron deficiency anemia, unspecified: Secondary | ICD-10-CM | POA: Diagnosis not present

## 2018-05-31 DIAGNOSIS — N2581 Secondary hyperparathyroidism of renal origin: Secondary | ICD-10-CM | POA: Diagnosis not present

## 2018-05-31 DIAGNOSIS — N186 End stage renal disease: Secondary | ICD-10-CM | POA: Diagnosis not present

## 2018-06-01 DIAGNOSIS — Z992 Dependence on renal dialysis: Secondary | ICD-10-CM | POA: Diagnosis not present

## 2018-06-01 DIAGNOSIS — N186 End stage renal disease: Secondary | ICD-10-CM | POA: Diagnosis not present

## 2018-06-01 DIAGNOSIS — T82858A Stenosis of vascular prosthetic devices, implants and grafts, initial encounter: Secondary | ICD-10-CM | POA: Diagnosis not present

## 2018-06-01 DIAGNOSIS — I871 Compression of vein: Secondary | ICD-10-CM | POA: Diagnosis not present

## 2018-06-02 DIAGNOSIS — N2581 Secondary hyperparathyroidism of renal origin: Secondary | ICD-10-CM | POA: Diagnosis not present

## 2018-06-02 DIAGNOSIS — N186 End stage renal disease: Secondary | ICD-10-CM | POA: Diagnosis not present

## 2018-06-02 DIAGNOSIS — D509 Iron deficiency anemia, unspecified: Secondary | ICD-10-CM | POA: Diagnosis not present

## 2018-06-02 DIAGNOSIS — D631 Anemia in chronic kidney disease: Secondary | ICD-10-CM | POA: Diagnosis not present

## 2018-06-05 DIAGNOSIS — N186 End stage renal disease: Secondary | ICD-10-CM | POA: Diagnosis not present

## 2018-06-05 DIAGNOSIS — D509 Iron deficiency anemia, unspecified: Secondary | ICD-10-CM | POA: Diagnosis not present

## 2018-06-05 DIAGNOSIS — N2581 Secondary hyperparathyroidism of renal origin: Secondary | ICD-10-CM | POA: Diagnosis not present

## 2018-06-05 DIAGNOSIS — D631 Anemia in chronic kidney disease: Secondary | ICD-10-CM | POA: Diagnosis not present

## 2018-06-06 DIAGNOSIS — N186 End stage renal disease: Secondary | ICD-10-CM | POA: Diagnosis not present

## 2018-06-06 DIAGNOSIS — Z992 Dependence on renal dialysis: Secondary | ICD-10-CM | POA: Diagnosis not present

## 2018-06-06 DIAGNOSIS — E1122 Type 2 diabetes mellitus with diabetic chronic kidney disease: Secondary | ICD-10-CM | POA: Diagnosis not present

## 2018-06-07 DIAGNOSIS — N2581 Secondary hyperparathyroidism of renal origin: Secondary | ICD-10-CM | POA: Diagnosis not present

## 2018-06-07 DIAGNOSIS — Z23 Encounter for immunization: Secondary | ICD-10-CM | POA: Diagnosis not present

## 2018-06-07 DIAGNOSIS — N186 End stage renal disease: Secondary | ICD-10-CM | POA: Diagnosis not present

## 2018-06-09 DIAGNOSIS — N186 End stage renal disease: Secondary | ICD-10-CM | POA: Diagnosis not present

## 2018-06-09 DIAGNOSIS — N2581 Secondary hyperparathyroidism of renal origin: Secondary | ICD-10-CM | POA: Diagnosis not present

## 2018-06-09 DIAGNOSIS — Z23 Encounter for immunization: Secondary | ICD-10-CM | POA: Diagnosis not present

## 2018-06-12 DIAGNOSIS — N186 End stage renal disease: Secondary | ICD-10-CM | POA: Diagnosis not present

## 2018-06-12 DIAGNOSIS — Z23 Encounter for immunization: Secondary | ICD-10-CM | POA: Diagnosis not present

## 2018-06-12 DIAGNOSIS — N2581 Secondary hyperparathyroidism of renal origin: Secondary | ICD-10-CM | POA: Diagnosis not present

## 2018-06-14 DIAGNOSIS — N186 End stage renal disease: Secondary | ICD-10-CM | POA: Diagnosis not present

## 2018-06-14 DIAGNOSIS — N2581 Secondary hyperparathyroidism of renal origin: Secondary | ICD-10-CM | POA: Diagnosis not present

## 2018-06-14 DIAGNOSIS — Z23 Encounter for immunization: Secondary | ICD-10-CM | POA: Diagnosis not present

## 2018-06-20 DIAGNOSIS — Z992 Dependence on renal dialysis: Secondary | ICD-10-CM | POA: Diagnosis not present

## 2018-06-20 DIAGNOSIS — E1122 Type 2 diabetes mellitus with diabetic chronic kidney disease: Secondary | ICD-10-CM | POA: Diagnosis present

## 2018-06-20 DIAGNOSIS — R000 Tachycardia, unspecified: Secondary | ICD-10-CM | POA: Diagnosis not present

## 2018-06-20 DIAGNOSIS — E876 Hypokalemia: Secondary | ICD-10-CM | POA: Diagnosis not present

## 2018-06-20 DIAGNOSIS — N186 End stage renal disease: Secondary | ICD-10-CM | POA: Diagnosis present

## 2018-06-20 DIAGNOSIS — E875 Hyperkalemia: Secondary | ICD-10-CM | POA: Diagnosis present

## 2018-06-20 DIAGNOSIS — I120 Hypertensive chronic kidney disease with stage 5 chronic kidney disease or end stage renal disease: Secondary | ICD-10-CM | POA: Diagnosis present

## 2018-06-20 DIAGNOSIS — I169 Hypertensive crisis, unspecified: Secondary | ICD-10-CM | POA: Diagnosis not present

## 2018-06-25 DIAGNOSIS — I120 Hypertensive chronic kidney disease with stage 5 chronic kidney disease or end stage renal disease: Secondary | ICD-10-CM | POA: Diagnosis not present

## 2018-06-25 DIAGNOSIS — Z992 Dependence on renal dialysis: Secondary | ICD-10-CM | POA: Diagnosis not present

## 2018-06-25 DIAGNOSIS — N186 End stage renal disease: Secondary | ICD-10-CM | POA: Diagnosis not present

## 2018-06-25 DIAGNOSIS — E875 Hyperkalemia: Secondary | ICD-10-CM | POA: Diagnosis not present

## 2018-06-27 DIAGNOSIS — Z992 Dependence on renal dialysis: Secondary | ICD-10-CM | POA: Diagnosis not present

## 2018-06-27 DIAGNOSIS — R7989 Other specified abnormal findings of blood chemistry: Secondary | ICD-10-CM | POA: Diagnosis not present

## 2018-06-27 DIAGNOSIS — E559 Vitamin D deficiency, unspecified: Secondary | ICD-10-CM | POA: Diagnosis not present

## 2018-06-27 DIAGNOSIS — K738 Other chronic hepatitis, not elsewhere classified: Secondary | ICD-10-CM | POA: Diagnosis not present

## 2018-06-27 DIAGNOSIS — N2581 Secondary hyperparathyroidism of renal origin: Secondary | ICD-10-CM | POA: Diagnosis not present

## 2018-06-27 DIAGNOSIS — E8809 Other disorders of plasma-protein metabolism, not elsewhere classified: Secondary | ICD-10-CM | POA: Diagnosis not present

## 2018-06-27 DIAGNOSIS — E1122 Type 2 diabetes mellitus with diabetic chronic kidney disease: Secondary | ICD-10-CM | POA: Diagnosis not present

## 2018-06-27 DIAGNOSIS — Z7901 Long term (current) use of anticoagulants: Secondary | ICD-10-CM | POA: Diagnosis not present

## 2018-06-27 DIAGNOSIS — N186 End stage renal disease: Secondary | ICD-10-CM | POA: Diagnosis not present

## 2018-06-27 DIAGNOSIS — D509 Iron deficiency anemia, unspecified: Secondary | ICD-10-CM | POA: Diagnosis not present

## 2018-06-27 DIAGNOSIS — E785 Hyperlipidemia, unspecified: Secondary | ICD-10-CM | POA: Diagnosis not present

## 2018-06-27 DIAGNOSIS — E878 Other disorders of electrolyte and fluid balance, not elsewhere classified: Secondary | ICD-10-CM | POA: Diagnosis not present

## 2018-06-27 DIAGNOSIS — D631 Anemia in chronic kidney disease: Secondary | ICD-10-CM | POA: Diagnosis not present

## 2018-06-28 DIAGNOSIS — D631 Anemia in chronic kidney disease: Secondary | ICD-10-CM | POA: Diagnosis not present

## 2018-06-28 DIAGNOSIS — Z7901 Long term (current) use of anticoagulants: Secondary | ICD-10-CM | POA: Diagnosis not present

## 2018-06-28 DIAGNOSIS — N186 End stage renal disease: Secondary | ICD-10-CM | POA: Diagnosis not present

## 2018-06-28 DIAGNOSIS — Z992 Dependence on renal dialysis: Secondary | ICD-10-CM | POA: Diagnosis not present

## 2018-06-28 DIAGNOSIS — E8809 Other disorders of plasma-protein metabolism, not elsewhere classified: Secondary | ICD-10-CM | POA: Diagnosis not present

## 2018-06-30 DIAGNOSIS — Z7901 Long term (current) use of anticoagulants: Secondary | ICD-10-CM | POA: Diagnosis not present

## 2018-06-30 DIAGNOSIS — N186 End stage renal disease: Secondary | ICD-10-CM | POA: Diagnosis not present

## 2018-06-30 DIAGNOSIS — Z992 Dependence on renal dialysis: Secondary | ICD-10-CM | POA: Diagnosis not present

## 2018-06-30 DIAGNOSIS — D631 Anemia in chronic kidney disease: Secondary | ICD-10-CM | POA: Diagnosis not present

## 2018-06-30 DIAGNOSIS — E8809 Other disorders of plasma-protein metabolism, not elsewhere classified: Secondary | ICD-10-CM | POA: Diagnosis not present

## 2018-07-03 DIAGNOSIS — Z7901 Long term (current) use of anticoagulants: Secondary | ICD-10-CM | POA: Diagnosis not present

## 2018-07-03 DIAGNOSIS — Z992 Dependence on renal dialysis: Secondary | ICD-10-CM | POA: Diagnosis not present

## 2018-07-03 DIAGNOSIS — D631 Anemia in chronic kidney disease: Secondary | ICD-10-CM | POA: Diagnosis not present

## 2018-07-03 DIAGNOSIS — N186 End stage renal disease: Secondary | ICD-10-CM | POA: Diagnosis not present

## 2018-07-03 DIAGNOSIS — E8809 Other disorders of plasma-protein metabolism, not elsewhere classified: Secondary | ICD-10-CM | POA: Diagnosis not present

## 2018-07-05 DIAGNOSIS — N186 End stage renal disease: Secondary | ICD-10-CM | POA: Diagnosis not present

## 2018-07-05 DIAGNOSIS — E8809 Other disorders of plasma-protein metabolism, not elsewhere classified: Secondary | ICD-10-CM | POA: Diagnosis not present

## 2018-07-05 DIAGNOSIS — D631 Anemia in chronic kidney disease: Secondary | ICD-10-CM | POA: Diagnosis not present

## 2018-07-05 DIAGNOSIS — Z992 Dependence on renal dialysis: Secondary | ICD-10-CM | POA: Diagnosis not present

## 2018-07-05 DIAGNOSIS — Z7901 Long term (current) use of anticoagulants: Secondary | ICD-10-CM | POA: Diagnosis not present

## 2018-07-06 DIAGNOSIS — Z992 Dependence on renal dialysis: Secondary | ICD-10-CM | POA: Diagnosis not present

## 2018-07-06 DIAGNOSIS — E1122 Type 2 diabetes mellitus with diabetic chronic kidney disease: Secondary | ICD-10-CM | POA: Diagnosis not present

## 2018-07-06 DIAGNOSIS — N186 End stage renal disease: Secondary | ICD-10-CM | POA: Diagnosis not present

## 2018-07-07 DIAGNOSIS — N2581 Secondary hyperparathyroidism of renal origin: Secondary | ICD-10-CM | POA: Diagnosis not present

## 2018-07-07 DIAGNOSIS — Z992 Dependence on renal dialysis: Secondary | ICD-10-CM | POA: Diagnosis not present

## 2018-07-07 DIAGNOSIS — D631 Anemia in chronic kidney disease: Secondary | ICD-10-CM | POA: Diagnosis not present

## 2018-07-07 DIAGNOSIS — D509 Iron deficiency anemia, unspecified: Secondary | ICD-10-CM | POA: Diagnosis not present

## 2018-07-07 DIAGNOSIS — N186 End stage renal disease: Secondary | ICD-10-CM | POA: Diagnosis not present

## 2018-07-07 DIAGNOSIS — Z7901 Long term (current) use of anticoagulants: Secondary | ICD-10-CM | POA: Diagnosis not present

## 2018-07-07 DIAGNOSIS — E162 Hypoglycemia, unspecified: Secondary | ICD-10-CM | POA: Diagnosis not present

## 2018-07-10 DIAGNOSIS — N2581 Secondary hyperparathyroidism of renal origin: Secondary | ICD-10-CM | POA: Diagnosis not present

## 2018-07-10 DIAGNOSIS — N186 End stage renal disease: Secondary | ICD-10-CM | POA: Diagnosis not present

## 2018-07-10 DIAGNOSIS — D631 Anemia in chronic kidney disease: Secondary | ICD-10-CM | POA: Diagnosis not present

## 2018-07-10 DIAGNOSIS — E162 Hypoglycemia, unspecified: Secondary | ICD-10-CM | POA: Diagnosis not present

## 2018-07-10 DIAGNOSIS — Z7901 Long term (current) use of anticoagulants: Secondary | ICD-10-CM | POA: Diagnosis not present

## 2018-07-10 DIAGNOSIS — Z992 Dependence on renal dialysis: Secondary | ICD-10-CM | POA: Diagnosis not present

## 2018-07-12 DIAGNOSIS — N2581 Secondary hyperparathyroidism of renal origin: Secondary | ICD-10-CM | POA: Diagnosis not present

## 2018-07-12 DIAGNOSIS — N186 End stage renal disease: Secondary | ICD-10-CM | POA: Diagnosis not present

## 2018-07-12 DIAGNOSIS — D631 Anemia in chronic kidney disease: Secondary | ICD-10-CM | POA: Diagnosis not present

## 2018-07-12 DIAGNOSIS — Z7901 Long term (current) use of anticoagulants: Secondary | ICD-10-CM | POA: Diagnosis not present

## 2018-07-12 DIAGNOSIS — E162 Hypoglycemia, unspecified: Secondary | ICD-10-CM | POA: Diagnosis not present

## 2018-07-12 DIAGNOSIS — Z992 Dependence on renal dialysis: Secondary | ICD-10-CM | POA: Diagnosis not present

## 2018-07-14 DIAGNOSIS — Z7901 Long term (current) use of anticoagulants: Secondary | ICD-10-CM | POA: Diagnosis not present

## 2018-07-14 DIAGNOSIS — E162 Hypoglycemia, unspecified: Secondary | ICD-10-CM | POA: Diagnosis not present

## 2018-07-14 DIAGNOSIS — Z992 Dependence on renal dialysis: Secondary | ICD-10-CM | POA: Diagnosis not present

## 2018-07-14 DIAGNOSIS — N2581 Secondary hyperparathyroidism of renal origin: Secondary | ICD-10-CM | POA: Diagnosis not present

## 2018-07-14 DIAGNOSIS — N186 End stage renal disease: Secondary | ICD-10-CM | POA: Diagnosis not present

## 2018-07-14 DIAGNOSIS — D631 Anemia in chronic kidney disease: Secondary | ICD-10-CM | POA: Diagnosis not present

## 2018-07-17 DIAGNOSIS — N186 End stage renal disease: Secondary | ICD-10-CM | POA: Diagnosis not present

## 2018-07-17 DIAGNOSIS — Z992 Dependence on renal dialysis: Secondary | ICD-10-CM | POA: Diagnosis not present

## 2018-07-17 DIAGNOSIS — N2581 Secondary hyperparathyroidism of renal origin: Secondary | ICD-10-CM | POA: Diagnosis not present

## 2018-07-17 DIAGNOSIS — D631 Anemia in chronic kidney disease: Secondary | ICD-10-CM | POA: Diagnosis not present

## 2018-07-17 DIAGNOSIS — E162 Hypoglycemia, unspecified: Secondary | ICD-10-CM | POA: Diagnosis not present

## 2018-07-17 DIAGNOSIS — Z7901 Long term (current) use of anticoagulants: Secondary | ICD-10-CM | POA: Diagnosis not present

## 2018-07-20 DIAGNOSIS — N186 End stage renal disease: Secondary | ICD-10-CM | POA: Diagnosis not present

## 2018-07-24 DIAGNOSIS — N186 End stage renal disease: Secondary | ICD-10-CM | POA: Diagnosis not present

## 2018-07-27 DIAGNOSIS — N186 End stage renal disease: Secondary | ICD-10-CM | POA: Diagnosis not present

## 2018-07-27 DIAGNOSIS — Z992 Dependence on renal dialysis: Secondary | ICD-10-CM | POA: Diagnosis not present

## 2018-07-27 DIAGNOSIS — Z7901 Long term (current) use of anticoagulants: Secondary | ICD-10-CM | POA: Diagnosis not present

## 2018-07-27 DIAGNOSIS — D631 Anemia in chronic kidney disease: Secondary | ICD-10-CM | POA: Diagnosis not present

## 2018-07-27 DIAGNOSIS — N2581 Secondary hyperparathyroidism of renal origin: Secondary | ICD-10-CM | POA: Diagnosis not present

## 2018-07-27 DIAGNOSIS — E162 Hypoglycemia, unspecified: Secondary | ICD-10-CM | POA: Diagnosis not present

## 2018-07-28 DIAGNOSIS — N186 End stage renal disease: Secondary | ICD-10-CM | POA: Diagnosis not present

## 2018-07-28 DIAGNOSIS — D631 Anemia in chronic kidney disease: Secondary | ICD-10-CM | POA: Diagnosis not present

## 2018-07-28 DIAGNOSIS — Z7901 Long term (current) use of anticoagulants: Secondary | ICD-10-CM | POA: Diagnosis not present

## 2018-07-28 DIAGNOSIS — Z992 Dependence on renal dialysis: Secondary | ICD-10-CM | POA: Diagnosis not present

## 2018-07-28 DIAGNOSIS — N2581 Secondary hyperparathyroidism of renal origin: Secondary | ICD-10-CM | POA: Diagnosis not present

## 2018-07-28 DIAGNOSIS — E162 Hypoglycemia, unspecified: Secondary | ICD-10-CM | POA: Diagnosis not present

## 2018-07-31 DIAGNOSIS — N186 End stage renal disease: Secondary | ICD-10-CM | POA: Diagnosis not present

## 2018-07-31 DIAGNOSIS — N2581 Secondary hyperparathyroidism of renal origin: Secondary | ICD-10-CM | POA: Diagnosis not present

## 2018-07-31 DIAGNOSIS — Z992 Dependence on renal dialysis: Secondary | ICD-10-CM | POA: Diagnosis not present

## 2018-07-31 DIAGNOSIS — Z7901 Long term (current) use of anticoagulants: Secondary | ICD-10-CM | POA: Diagnosis not present

## 2018-07-31 DIAGNOSIS — E162 Hypoglycemia, unspecified: Secondary | ICD-10-CM | POA: Diagnosis not present

## 2018-07-31 DIAGNOSIS — D631 Anemia in chronic kidney disease: Secondary | ICD-10-CM | POA: Diagnosis not present

## 2018-08-02 DIAGNOSIS — N2581 Secondary hyperparathyroidism of renal origin: Secondary | ICD-10-CM | POA: Diagnosis not present

## 2018-08-02 DIAGNOSIS — Z7901 Long term (current) use of anticoagulants: Secondary | ICD-10-CM | POA: Diagnosis not present

## 2018-08-02 DIAGNOSIS — D631 Anemia in chronic kidney disease: Secondary | ICD-10-CM | POA: Diagnosis not present

## 2018-08-02 DIAGNOSIS — N186 End stage renal disease: Secondary | ICD-10-CM | POA: Diagnosis not present

## 2018-08-02 DIAGNOSIS — E162 Hypoglycemia, unspecified: Secondary | ICD-10-CM | POA: Diagnosis not present

## 2018-08-02 DIAGNOSIS — Z992 Dependence on renal dialysis: Secondary | ICD-10-CM | POA: Diagnosis not present

## 2018-08-04 DIAGNOSIS — N186 End stage renal disease: Secondary | ICD-10-CM | POA: Diagnosis not present

## 2018-08-04 DIAGNOSIS — Z7901 Long term (current) use of anticoagulants: Secondary | ICD-10-CM | POA: Diagnosis not present

## 2018-08-04 DIAGNOSIS — D631 Anemia in chronic kidney disease: Secondary | ICD-10-CM | POA: Diagnosis not present

## 2018-08-04 DIAGNOSIS — E162 Hypoglycemia, unspecified: Secondary | ICD-10-CM | POA: Diagnosis not present

## 2018-08-04 DIAGNOSIS — N2581 Secondary hyperparathyroidism of renal origin: Secondary | ICD-10-CM | POA: Diagnosis not present

## 2018-08-04 DIAGNOSIS — Z992 Dependence on renal dialysis: Secondary | ICD-10-CM | POA: Diagnosis not present

## 2018-08-06 DIAGNOSIS — N186 End stage renal disease: Secondary | ICD-10-CM | POA: Diagnosis not present

## 2018-08-06 DIAGNOSIS — E1122 Type 2 diabetes mellitus with diabetic chronic kidney disease: Secondary | ICD-10-CM | POA: Diagnosis not present

## 2018-08-06 DIAGNOSIS — Z992 Dependence on renal dialysis: Secondary | ICD-10-CM | POA: Diagnosis not present

## 2018-08-07 DIAGNOSIS — N2581 Secondary hyperparathyroidism of renal origin: Secondary | ICD-10-CM | POA: Diagnosis not present

## 2018-08-07 DIAGNOSIS — Z7901 Long term (current) use of anticoagulants: Secondary | ICD-10-CM | POA: Diagnosis not present

## 2018-08-07 DIAGNOSIS — Z992 Dependence on renal dialysis: Secondary | ICD-10-CM | POA: Diagnosis not present

## 2018-08-07 DIAGNOSIS — D509 Iron deficiency anemia, unspecified: Secondary | ICD-10-CM | POA: Diagnosis not present

## 2018-08-07 DIAGNOSIS — D631 Anemia in chronic kidney disease: Secondary | ICD-10-CM | POA: Diagnosis not present

## 2018-08-07 DIAGNOSIS — N186 End stage renal disease: Secondary | ICD-10-CM | POA: Diagnosis not present

## 2018-08-09 DIAGNOSIS — D631 Anemia in chronic kidney disease: Secondary | ICD-10-CM | POA: Diagnosis not present

## 2018-08-09 DIAGNOSIS — D509 Iron deficiency anemia, unspecified: Secondary | ICD-10-CM | POA: Diagnosis not present

## 2018-08-09 DIAGNOSIS — Z7901 Long term (current) use of anticoagulants: Secondary | ICD-10-CM | POA: Diagnosis not present

## 2018-08-09 DIAGNOSIS — N186 End stage renal disease: Secondary | ICD-10-CM | POA: Diagnosis not present

## 2018-08-09 DIAGNOSIS — Z992 Dependence on renal dialysis: Secondary | ICD-10-CM | POA: Diagnosis not present

## 2018-08-09 DIAGNOSIS — N2581 Secondary hyperparathyroidism of renal origin: Secondary | ICD-10-CM | POA: Diagnosis not present

## 2018-08-11 DIAGNOSIS — N186 End stage renal disease: Secondary | ICD-10-CM | POA: Diagnosis not present

## 2018-08-11 DIAGNOSIS — D631 Anemia in chronic kidney disease: Secondary | ICD-10-CM | POA: Diagnosis not present

## 2018-08-11 DIAGNOSIS — N2581 Secondary hyperparathyroidism of renal origin: Secondary | ICD-10-CM | POA: Diagnosis not present

## 2018-08-11 DIAGNOSIS — Z992 Dependence on renal dialysis: Secondary | ICD-10-CM | POA: Diagnosis not present

## 2018-08-11 DIAGNOSIS — D509 Iron deficiency anemia, unspecified: Secondary | ICD-10-CM | POA: Diagnosis not present

## 2018-08-11 DIAGNOSIS — Z7901 Long term (current) use of anticoagulants: Secondary | ICD-10-CM | POA: Diagnosis not present

## 2018-08-14 DIAGNOSIS — D509 Iron deficiency anemia, unspecified: Secondary | ICD-10-CM | POA: Diagnosis not present

## 2018-08-14 DIAGNOSIS — N186 End stage renal disease: Secondary | ICD-10-CM | POA: Diagnosis not present

## 2018-08-14 DIAGNOSIS — Z7901 Long term (current) use of anticoagulants: Secondary | ICD-10-CM | POA: Diagnosis not present

## 2018-08-14 DIAGNOSIS — Z992 Dependence on renal dialysis: Secondary | ICD-10-CM | POA: Diagnosis not present

## 2018-08-14 DIAGNOSIS — N2581 Secondary hyperparathyroidism of renal origin: Secondary | ICD-10-CM | POA: Diagnosis not present

## 2018-08-14 DIAGNOSIS — D631 Anemia in chronic kidney disease: Secondary | ICD-10-CM | POA: Diagnosis not present

## 2018-08-16 DIAGNOSIS — Z7901 Long term (current) use of anticoagulants: Secondary | ICD-10-CM | POA: Diagnosis not present

## 2018-08-16 DIAGNOSIS — N186 End stage renal disease: Secondary | ICD-10-CM | POA: Diagnosis not present

## 2018-08-16 DIAGNOSIS — Z992 Dependence on renal dialysis: Secondary | ICD-10-CM | POA: Diagnosis not present

## 2018-08-16 DIAGNOSIS — D631 Anemia in chronic kidney disease: Secondary | ICD-10-CM | POA: Diagnosis not present

## 2018-08-16 DIAGNOSIS — N2581 Secondary hyperparathyroidism of renal origin: Secondary | ICD-10-CM | POA: Diagnosis not present

## 2018-08-16 DIAGNOSIS — D509 Iron deficiency anemia, unspecified: Secondary | ICD-10-CM | POA: Diagnosis not present

## 2018-08-18 DIAGNOSIS — D509 Iron deficiency anemia, unspecified: Secondary | ICD-10-CM | POA: Diagnosis not present

## 2018-08-18 DIAGNOSIS — Z992 Dependence on renal dialysis: Secondary | ICD-10-CM | POA: Diagnosis not present

## 2018-08-18 DIAGNOSIS — N2581 Secondary hyperparathyroidism of renal origin: Secondary | ICD-10-CM | POA: Diagnosis not present

## 2018-08-18 DIAGNOSIS — N186 End stage renal disease: Secondary | ICD-10-CM | POA: Diagnosis not present

## 2018-08-18 DIAGNOSIS — Z7901 Long term (current) use of anticoagulants: Secondary | ICD-10-CM | POA: Diagnosis not present

## 2018-08-18 DIAGNOSIS — D631 Anemia in chronic kidney disease: Secondary | ICD-10-CM | POA: Diagnosis not present

## 2018-08-19 DIAGNOSIS — R4182 Altered mental status, unspecified: Secondary | ICD-10-CM | POA: Diagnosis not present

## 2018-08-19 DIAGNOSIS — N186 End stage renal disease: Secondary | ICD-10-CM | POA: Diagnosis not present

## 2018-08-19 DIAGNOSIS — G40909 Epilepsy, unspecified, not intractable, without status epilepticus: Secondary | ICD-10-CM | POA: Diagnosis not present

## 2018-08-19 DIAGNOSIS — G4089 Other seizures: Secondary | ICD-10-CM | POA: Diagnosis not present

## 2018-08-19 DIAGNOSIS — R569 Unspecified convulsions: Secondary | ICD-10-CM | POA: Diagnosis not present

## 2018-08-19 DIAGNOSIS — R531 Weakness: Secondary | ICD-10-CM | POA: Diagnosis not present

## 2018-08-19 DIAGNOSIS — Z6828 Body mass index (BMI) 28.0-28.9, adult: Secondary | ICD-10-CM | POA: Diagnosis not present

## 2018-08-19 DIAGNOSIS — Z992 Dependence on renal dialysis: Secondary | ICD-10-CM | POA: Diagnosis not present

## 2018-08-19 DIAGNOSIS — E669 Obesity, unspecified: Secondary | ICD-10-CM | POA: Diagnosis not present

## 2018-08-19 DIAGNOSIS — Z743 Need for continuous supervision: Secondary | ICD-10-CM | POA: Diagnosis not present

## 2018-08-19 DIAGNOSIS — E1165 Type 2 diabetes mellitus with hyperglycemia: Secondary | ICD-10-CM | POA: Diagnosis not present

## 2018-08-19 DIAGNOSIS — E1122 Type 2 diabetes mellitus with diabetic chronic kidney disease: Secondary | ICD-10-CM | POA: Diagnosis not present

## 2018-08-19 DIAGNOSIS — I120 Hypertensive chronic kidney disease with stage 5 chronic kidney disease or end stage renal disease: Secondary | ICD-10-CM | POA: Diagnosis not present

## 2018-08-19 DIAGNOSIS — E875 Hyperkalemia: Secondary | ICD-10-CM | POA: Diagnosis not present

## 2018-08-23 DIAGNOSIS — E1310 Other specified diabetes mellitus with ketoacidosis without coma: Secondary | ICD-10-CM | POA: Diagnosis not present

## 2018-08-23 DIAGNOSIS — E1022 Type 1 diabetes mellitus with diabetic chronic kidney disease: Secondary | ICD-10-CM | POA: Diagnosis present

## 2018-08-23 DIAGNOSIS — J9601 Acute respiratory failure with hypoxia: Secondary | ICD-10-CM | POA: Diagnosis not present

## 2018-08-23 DIAGNOSIS — E1122 Type 2 diabetes mellitus with diabetic chronic kidney disease: Secondary | ICD-10-CM | POA: Diagnosis not present

## 2018-08-23 DIAGNOSIS — Z9115 Patient's noncompliance with renal dialysis: Secondary | ICD-10-CM | POA: Diagnosis not present

## 2018-08-23 DIAGNOSIS — Z9911 Dependence on respirator [ventilator] status: Secondary | ICD-10-CM | POA: Diagnosis not present

## 2018-08-23 DIAGNOSIS — J9600 Acute respiratory failure, unspecified whether with hypoxia or hypercapnia: Secondary | ICD-10-CM | POA: Diagnosis not present

## 2018-08-23 DIAGNOSIS — I10 Essential (primary) hypertension: Secondary | ICD-10-CM | POA: Diagnosis not present

## 2018-08-23 DIAGNOSIS — Z743 Need for continuous supervision: Secondary | ICD-10-CM | POA: Diagnosis not present

## 2018-08-23 DIAGNOSIS — R0989 Other specified symptoms and signs involving the circulatory and respiratory systems: Secondary | ICD-10-CM | POA: Diagnosis not present

## 2018-08-23 DIAGNOSIS — R402252 Coma scale, best verbal response, oriented, at arrival to emergency department: Secondary | ICD-10-CM | POA: Diagnosis present

## 2018-08-23 DIAGNOSIS — I472 Ventricular tachycardia: Secondary | ICD-10-CM | POA: Diagnosis present

## 2018-08-23 DIAGNOSIS — I340 Nonrheumatic mitral (valve) insufficiency: Secondary | ICD-10-CM | POA: Diagnosis not present

## 2018-08-23 DIAGNOSIS — N186 End stage renal disease: Secondary | ICD-10-CM | POA: Diagnosis present

## 2018-08-23 DIAGNOSIS — N179 Acute kidney failure, unspecified: Secondary | ICD-10-CM | POA: Diagnosis not present

## 2018-08-23 DIAGNOSIS — Z9889 Other specified postprocedural states: Secondary | ICD-10-CM | POA: Diagnosis not present

## 2018-08-23 DIAGNOSIS — Z4682 Encounter for fitting and adjustment of non-vascular catheter: Secondary | ICD-10-CM | POA: Diagnosis not present

## 2018-08-23 DIAGNOSIS — R918 Other nonspecific abnormal finding of lung field: Secondary | ICD-10-CM | POA: Diagnosis not present

## 2018-08-23 DIAGNOSIS — R1110 Vomiting, unspecified: Secondary | ICD-10-CM | POA: Diagnosis not present

## 2018-08-23 DIAGNOSIS — R0603 Acute respiratory distress: Secondary | ICD-10-CM | POA: Diagnosis not present

## 2018-08-23 DIAGNOSIS — R569 Unspecified convulsions: Secondary | ICD-10-CM | POA: Diagnosis not present

## 2018-08-23 DIAGNOSIS — I469 Cardiac arrest, cause unspecified: Secondary | ICD-10-CM | POA: Diagnosis not present

## 2018-08-23 DIAGNOSIS — G40909 Epilepsy, unspecified, not intractable, without status epilepticus: Secondary | ICD-10-CM | POA: Diagnosis present

## 2018-08-23 DIAGNOSIS — R4182 Altered mental status, unspecified: Secondary | ICD-10-CM | POA: Diagnosis not present

## 2018-08-23 DIAGNOSIS — E1011 Type 1 diabetes mellitus with ketoacidosis with coma: Secondary | ICD-10-CM | POA: Diagnosis not present

## 2018-08-23 DIAGNOSIS — R402362 Coma scale, best motor response, obeys commands, at arrival to emergency department: Secondary | ICD-10-CM | POA: Diagnosis present

## 2018-08-23 DIAGNOSIS — E1065 Type 1 diabetes mellitus with hyperglycemia: Secondary | ICD-10-CM | POA: Diagnosis present

## 2018-08-23 DIAGNOSIS — E1010 Type 1 diabetes mellitus with ketoacidosis without coma: Secondary | ICD-10-CM | POA: Diagnosis present

## 2018-08-23 DIAGNOSIS — G9341 Metabolic encephalopathy: Secondary | ICD-10-CM | POA: Diagnosis not present

## 2018-08-23 DIAGNOSIS — I351 Nonrheumatic aortic (valve) insufficiency: Secondary | ICD-10-CM | POA: Diagnosis not present

## 2018-08-23 DIAGNOSIS — R140 Abdominal distension (gaseous): Secondary | ICD-10-CM | POA: Diagnosis not present

## 2018-08-23 DIAGNOSIS — Z992 Dependence on renal dialysis: Secondary | ICD-10-CM | POA: Diagnosis not present

## 2018-08-23 DIAGNOSIS — E10649 Type 1 diabetes mellitus with hypoglycemia without coma: Secondary | ICD-10-CM | POA: Diagnosis not present

## 2018-08-23 DIAGNOSIS — I120 Hypertensive chronic kidney disease with stage 5 chronic kidney disease or end stage renal disease: Secondary | ICD-10-CM | POA: Diagnosis present

## 2018-08-23 DIAGNOSIS — Z794 Long term (current) use of insulin: Secondary | ICD-10-CM | POA: Diagnosis not present

## 2018-08-23 DIAGNOSIS — R402132 Coma scale, eyes open, to sound, at arrival to emergency department: Secondary | ICD-10-CM | POA: Diagnosis present

## 2018-08-23 DIAGNOSIS — E875 Hyperkalemia: Secondary | ICD-10-CM | POA: Diagnosis not present

## 2018-08-23 DIAGNOSIS — R531 Weakness: Secondary | ICD-10-CM | POA: Diagnosis not present

## 2018-08-23 DIAGNOSIS — Z452 Encounter for adjustment and management of vascular access device: Secondary | ICD-10-CM | POA: Diagnosis not present

## 2018-08-23 DIAGNOSIS — E1110 Type 2 diabetes mellitus with ketoacidosis without coma: Secondary | ICD-10-CM | POA: Diagnosis not present

## 2018-08-23 DIAGNOSIS — Z029 Encounter for administrative examinations, unspecified: Secondary | ICD-10-CM | POA: Diagnosis not present

## 2018-08-28 DIAGNOSIS — D509 Iron deficiency anemia, unspecified: Secondary | ICD-10-CM | POA: Diagnosis not present

## 2018-08-28 DIAGNOSIS — N2581 Secondary hyperparathyroidism of renal origin: Secondary | ICD-10-CM | POA: Diagnosis not present

## 2018-08-28 DIAGNOSIS — Z992 Dependence on renal dialysis: Secondary | ICD-10-CM | POA: Diagnosis not present

## 2018-08-28 DIAGNOSIS — D631 Anemia in chronic kidney disease: Secondary | ICD-10-CM | POA: Diagnosis not present

## 2018-08-28 DIAGNOSIS — N186 End stage renal disease: Secondary | ICD-10-CM | POA: Diagnosis not present

## 2018-08-28 DIAGNOSIS — Z7901 Long term (current) use of anticoagulants: Secondary | ICD-10-CM | POA: Diagnosis not present

## 2018-08-30 DIAGNOSIS — Z7901 Long term (current) use of anticoagulants: Secondary | ICD-10-CM | POA: Diagnosis not present

## 2018-08-30 DIAGNOSIS — Z992 Dependence on renal dialysis: Secondary | ICD-10-CM | POA: Diagnosis not present

## 2018-08-30 DIAGNOSIS — N2581 Secondary hyperparathyroidism of renal origin: Secondary | ICD-10-CM | POA: Diagnosis not present

## 2018-08-30 DIAGNOSIS — D509 Iron deficiency anemia, unspecified: Secondary | ICD-10-CM | POA: Diagnosis not present

## 2018-08-30 DIAGNOSIS — N186 End stage renal disease: Secondary | ICD-10-CM | POA: Diagnosis not present

## 2018-08-30 DIAGNOSIS — D631 Anemia in chronic kidney disease: Secondary | ICD-10-CM | POA: Diagnosis not present

## 2018-09-01 DIAGNOSIS — Z992 Dependence on renal dialysis: Secondary | ICD-10-CM | POA: Diagnosis not present

## 2018-09-01 DIAGNOSIS — D509 Iron deficiency anemia, unspecified: Secondary | ICD-10-CM | POA: Diagnosis not present

## 2018-09-01 DIAGNOSIS — Z7901 Long term (current) use of anticoagulants: Secondary | ICD-10-CM | POA: Diagnosis not present

## 2018-09-01 DIAGNOSIS — D631 Anemia in chronic kidney disease: Secondary | ICD-10-CM | POA: Diagnosis not present

## 2018-09-01 DIAGNOSIS — N2581 Secondary hyperparathyroidism of renal origin: Secondary | ICD-10-CM | POA: Diagnosis not present

## 2018-09-01 DIAGNOSIS — N186 End stage renal disease: Secondary | ICD-10-CM | POA: Diagnosis not present

## 2018-09-04 DIAGNOSIS — D509 Iron deficiency anemia, unspecified: Secondary | ICD-10-CM | POA: Diagnosis not present

## 2018-09-04 DIAGNOSIS — D631 Anemia in chronic kidney disease: Secondary | ICD-10-CM | POA: Diagnosis not present

## 2018-09-04 DIAGNOSIS — N186 End stage renal disease: Secondary | ICD-10-CM | POA: Diagnosis not present

## 2018-09-04 DIAGNOSIS — Z7901 Long term (current) use of anticoagulants: Secondary | ICD-10-CM | POA: Diagnosis not present

## 2018-09-04 DIAGNOSIS — N2581 Secondary hyperparathyroidism of renal origin: Secondary | ICD-10-CM | POA: Diagnosis not present

## 2018-09-04 DIAGNOSIS — Z992 Dependence on renal dialysis: Secondary | ICD-10-CM | POA: Diagnosis not present

## 2018-09-05 DIAGNOSIS — Z992 Dependence on renal dialysis: Secondary | ICD-10-CM | POA: Diagnosis not present

## 2018-09-05 DIAGNOSIS — N186 End stage renal disease: Secondary | ICD-10-CM | POA: Diagnosis not present

## 2018-09-05 DIAGNOSIS — E1122 Type 2 diabetes mellitus with diabetic chronic kidney disease: Secondary | ICD-10-CM | POA: Diagnosis not present

## 2018-09-06 DIAGNOSIS — E785 Hyperlipidemia, unspecified: Secondary | ICD-10-CM | POA: Diagnosis not present

## 2018-09-06 DIAGNOSIS — E8770 Fluid overload, unspecified: Secondary | ICD-10-CM | POA: Diagnosis not present

## 2018-09-06 DIAGNOSIS — N2581 Secondary hyperparathyroidism of renal origin: Secondary | ICD-10-CM | POA: Diagnosis not present

## 2018-09-06 DIAGNOSIS — E875 Hyperkalemia: Secondary | ICD-10-CM | POA: Diagnosis not present

## 2018-09-06 DIAGNOSIS — E162 Hypoglycemia, unspecified: Secondary | ICD-10-CM | POA: Diagnosis not present

## 2018-09-06 DIAGNOSIS — Z7901 Long term (current) use of anticoagulants: Secondary | ICD-10-CM | POA: Diagnosis not present

## 2018-09-06 DIAGNOSIS — K738 Other chronic hepatitis, not elsewhere classified: Secondary | ICD-10-CM | POA: Diagnosis not present

## 2018-09-06 DIAGNOSIS — D509 Iron deficiency anemia, unspecified: Secondary | ICD-10-CM | POA: Diagnosis not present

## 2018-09-06 DIAGNOSIS — Z992 Dependence on renal dialysis: Secondary | ICD-10-CM | POA: Diagnosis not present

## 2018-09-06 DIAGNOSIS — E559 Vitamin D deficiency, unspecified: Secondary | ICD-10-CM | POA: Diagnosis not present

## 2018-09-06 DIAGNOSIS — E1122 Type 2 diabetes mellitus with diabetic chronic kidney disease: Secondary | ICD-10-CM | POA: Diagnosis not present

## 2018-09-06 DIAGNOSIS — E878 Other disorders of electrolyte and fluid balance, not elsewhere classified: Secondary | ICD-10-CM | POA: Diagnosis not present

## 2018-09-06 DIAGNOSIS — R7989 Other specified abnormal findings of blood chemistry: Secondary | ICD-10-CM | POA: Diagnosis not present

## 2018-09-06 DIAGNOSIS — N186 End stage renal disease: Secondary | ICD-10-CM | POA: Diagnosis not present

## 2018-09-06 DIAGNOSIS — D631 Anemia in chronic kidney disease: Secondary | ICD-10-CM | POA: Diagnosis not present

## 2018-09-08 DIAGNOSIS — Z7901 Long term (current) use of anticoagulants: Secondary | ICD-10-CM | POA: Diagnosis not present

## 2018-09-08 DIAGNOSIS — N2581 Secondary hyperparathyroidism of renal origin: Secondary | ICD-10-CM | POA: Diagnosis not present

## 2018-09-08 DIAGNOSIS — N186 End stage renal disease: Secondary | ICD-10-CM | POA: Diagnosis not present

## 2018-09-08 DIAGNOSIS — D631 Anemia in chronic kidney disease: Secondary | ICD-10-CM | POA: Diagnosis not present

## 2018-09-08 DIAGNOSIS — E1122 Type 2 diabetes mellitus with diabetic chronic kidney disease: Secondary | ICD-10-CM | POA: Diagnosis not present

## 2018-09-08 DIAGNOSIS — Z992 Dependence on renal dialysis: Secondary | ICD-10-CM | POA: Diagnosis not present

## 2018-09-11 DIAGNOSIS — E1122 Type 2 diabetes mellitus with diabetic chronic kidney disease: Secondary | ICD-10-CM | POA: Diagnosis not present

## 2018-09-11 DIAGNOSIS — N186 End stage renal disease: Secondary | ICD-10-CM | POA: Diagnosis not present

## 2018-09-11 DIAGNOSIS — D631 Anemia in chronic kidney disease: Secondary | ICD-10-CM | POA: Diagnosis not present

## 2018-09-11 DIAGNOSIS — N2581 Secondary hyperparathyroidism of renal origin: Secondary | ICD-10-CM | POA: Diagnosis not present

## 2018-09-11 DIAGNOSIS — Z7901 Long term (current) use of anticoagulants: Secondary | ICD-10-CM | POA: Diagnosis not present

## 2018-09-11 DIAGNOSIS — Z992 Dependence on renal dialysis: Secondary | ICD-10-CM | POA: Diagnosis not present

## 2018-09-13 DIAGNOSIS — N2581 Secondary hyperparathyroidism of renal origin: Secondary | ICD-10-CM | POA: Diagnosis not present

## 2018-09-13 DIAGNOSIS — D631 Anemia in chronic kidney disease: Secondary | ICD-10-CM | POA: Diagnosis not present

## 2018-09-13 DIAGNOSIS — Z7901 Long term (current) use of anticoagulants: Secondary | ICD-10-CM | POA: Diagnosis not present

## 2018-09-13 DIAGNOSIS — Z992 Dependence on renal dialysis: Secondary | ICD-10-CM | POA: Diagnosis not present

## 2018-09-13 DIAGNOSIS — N186 End stage renal disease: Secondary | ICD-10-CM | POA: Diagnosis not present

## 2018-09-13 DIAGNOSIS — E1122 Type 2 diabetes mellitus with diabetic chronic kidney disease: Secondary | ICD-10-CM | POA: Diagnosis not present

## 2018-09-15 DIAGNOSIS — Z992 Dependence on renal dialysis: Secondary | ICD-10-CM | POA: Diagnosis not present

## 2018-09-15 DIAGNOSIS — E1122 Type 2 diabetes mellitus with diabetic chronic kidney disease: Secondary | ICD-10-CM | POA: Diagnosis not present

## 2018-09-15 DIAGNOSIS — Z7901 Long term (current) use of anticoagulants: Secondary | ICD-10-CM | POA: Diagnosis not present

## 2018-09-15 DIAGNOSIS — N186 End stage renal disease: Secondary | ICD-10-CM | POA: Diagnosis not present

## 2018-09-15 DIAGNOSIS — N2581 Secondary hyperparathyroidism of renal origin: Secondary | ICD-10-CM | POA: Diagnosis not present

## 2018-09-15 DIAGNOSIS — D631 Anemia in chronic kidney disease: Secondary | ICD-10-CM | POA: Diagnosis not present

## 2018-09-17 DIAGNOSIS — Z9119 Patient's noncompliance with other medical treatment and regimen: Secondary | ICD-10-CM | POA: Diagnosis not present

## 2018-09-17 DIAGNOSIS — Z992 Dependence on renal dialysis: Secondary | ICD-10-CM | POA: Diagnosis not present

## 2018-09-17 DIAGNOSIS — E1122 Type 2 diabetes mellitus with diabetic chronic kidney disease: Secondary | ICD-10-CM | POA: Diagnosis not present

## 2018-09-17 DIAGNOSIS — Z794 Long term (current) use of insulin: Secondary | ICD-10-CM | POA: Diagnosis not present

## 2018-09-17 DIAGNOSIS — J9620 Acute and chronic respiratory failure, unspecified whether with hypoxia or hypercapnia: Secondary | ICD-10-CM | POA: Diagnosis not present

## 2018-09-17 DIAGNOSIS — Z743 Need for continuous supervision: Secondary | ICD-10-CM | POA: Diagnosis not present

## 2018-09-17 DIAGNOSIS — R7309 Other abnormal glucose: Secondary | ICD-10-CM | POA: Diagnosis not present

## 2018-09-17 DIAGNOSIS — J9811 Atelectasis: Secondary | ICD-10-CM | POA: Diagnosis not present

## 2018-09-17 DIAGNOSIS — E875 Hyperkalemia: Secondary | ICD-10-CM | POA: Diagnosis not present

## 2018-09-17 DIAGNOSIS — R1111 Vomiting without nausea: Secondary | ICD-10-CM | POA: Diagnosis not present

## 2018-09-17 DIAGNOSIS — I5032 Chronic diastolic (congestive) heart failure: Secondary | ICD-10-CM | POA: Diagnosis present

## 2018-09-17 DIAGNOSIS — Z20828 Contact with and (suspected) exposure to other viral communicable diseases: Secondary | ICD-10-CM | POA: Diagnosis not present

## 2018-09-17 DIAGNOSIS — I132 Hypertensive heart and chronic kidney disease with heart failure and with stage 5 chronic kidney disease, or end stage renal disease: Secondary | ICD-10-CM | POA: Diagnosis present

## 2018-09-17 DIAGNOSIS — E860 Dehydration: Secondary | ICD-10-CM | POA: Diagnosis not present

## 2018-09-17 DIAGNOSIS — E1022 Type 1 diabetes mellitus with diabetic chronic kidney disease: Secondary | ICD-10-CM | POA: Diagnosis not present

## 2018-09-17 DIAGNOSIS — E1011 Type 1 diabetes mellitus with ketoacidosis with coma: Secondary | ICD-10-CM | POA: Diagnosis not present

## 2018-09-17 DIAGNOSIS — R112 Nausea with vomiting, unspecified: Secondary | ICD-10-CM | POA: Diagnosis not present

## 2018-09-17 DIAGNOSIS — R739 Hyperglycemia, unspecified: Secondary | ICD-10-CM | POA: Diagnosis not present

## 2018-09-17 DIAGNOSIS — I517 Cardiomegaly: Secondary | ICD-10-CM | POA: Diagnosis not present

## 2018-09-17 DIAGNOSIS — Z9911 Dependence on respirator [ventilator] status: Secondary | ICD-10-CM | POA: Diagnosis not present

## 2018-09-17 DIAGNOSIS — I469 Cardiac arrest, cause unspecified: Secondary | ICD-10-CM | POA: Diagnosis not present

## 2018-09-17 DIAGNOSIS — R569 Unspecified convulsions: Secondary | ICD-10-CM | POA: Diagnosis not present

## 2018-09-17 DIAGNOSIS — I120 Hypertensive chronic kidney disease with stage 5 chronic kidney disease or end stage renal disease: Secondary | ICD-10-CM | POA: Diagnosis not present

## 2018-09-17 DIAGNOSIS — E1110 Type 2 diabetes mellitus with ketoacidosis without coma: Secondary | ICD-10-CM | POA: Diagnosis not present

## 2018-09-17 DIAGNOSIS — Z452 Encounter for adjustment and management of vascular access device: Secondary | ICD-10-CM | POA: Diagnosis not present

## 2018-09-17 DIAGNOSIS — R402132 Coma scale, eyes open, to sound, at arrival to emergency department: Secondary | ICD-10-CM | POA: Diagnosis present

## 2018-09-17 DIAGNOSIS — R402352 Coma scale, best motor response, localizes pain, at arrival to emergency department: Secondary | ICD-10-CM | POA: Diagnosis present

## 2018-09-17 DIAGNOSIS — E1010 Type 1 diabetes mellitus with ketoacidosis without coma: Secondary | ICD-10-CM | POA: Diagnosis not present

## 2018-09-17 DIAGNOSIS — J9601 Acute respiratory failure with hypoxia: Secondary | ICD-10-CM | POA: Diagnosis not present

## 2018-09-17 DIAGNOSIS — N186 End stage renal disease: Secondary | ICD-10-CM | POA: Diagnosis not present

## 2018-09-17 DIAGNOSIS — Z4682 Encounter for fitting and adjustment of non-vascular catheter: Secondary | ICD-10-CM | POA: Diagnosis not present

## 2018-09-17 DIAGNOSIS — G40909 Epilepsy, unspecified, not intractable, without status epilepticus: Secondary | ICD-10-CM | POA: Diagnosis present

## 2018-09-17 DIAGNOSIS — E872 Acidosis: Secondary | ICD-10-CM | POA: Diagnosis not present

## 2018-09-17 DIAGNOSIS — J9600 Acute respiratory failure, unspecified whether with hypoxia or hypercapnia: Secondary | ICD-10-CM | POA: Diagnosis not present

## 2018-09-17 DIAGNOSIS — J9690 Respiratory failure, unspecified, unspecified whether with hypoxia or hypercapnia: Secondary | ICD-10-CM | POA: Diagnosis not present

## 2018-09-17 DIAGNOSIS — E871 Hypo-osmolality and hyponatremia: Secondary | ICD-10-CM | POA: Diagnosis not present

## 2018-09-17 DIAGNOSIS — R402232 Coma scale, best verbal response, inappropriate words, at arrival to emergency department: Secondary | ICD-10-CM | POA: Diagnosis present

## 2018-09-17 DIAGNOSIS — R531 Weakness: Secondary | ICD-10-CM | POA: Diagnosis not present

## 2018-09-21 DIAGNOSIS — N2581 Secondary hyperparathyroidism of renal origin: Secondary | ICD-10-CM | POA: Diagnosis not present

## 2018-09-21 DIAGNOSIS — Z992 Dependence on renal dialysis: Secondary | ICD-10-CM | POA: Diagnosis not present

## 2018-09-21 DIAGNOSIS — Z7901 Long term (current) use of anticoagulants: Secondary | ICD-10-CM | POA: Diagnosis not present

## 2018-09-21 DIAGNOSIS — N186 End stage renal disease: Secondary | ICD-10-CM | POA: Diagnosis not present

## 2018-09-21 DIAGNOSIS — D631 Anemia in chronic kidney disease: Secondary | ICD-10-CM | POA: Diagnosis not present

## 2018-09-21 DIAGNOSIS — E1122 Type 2 diabetes mellitus with diabetic chronic kidney disease: Secondary | ICD-10-CM | POA: Diagnosis not present

## 2018-09-24 DIAGNOSIS — E1121 Type 2 diabetes mellitus with diabetic nephropathy: Secondary | ICD-10-CM | POA: Diagnosis not present

## 2018-09-24 DIAGNOSIS — I2510 Atherosclerotic heart disease of native coronary artery without angina pectoris: Secondary | ICD-10-CM | POA: Diagnosis not present

## 2018-09-24 DIAGNOSIS — I10 Essential (primary) hypertension: Secondary | ICD-10-CM | POA: Diagnosis not present

## 2018-09-24 DIAGNOSIS — G40909 Epilepsy, unspecified, not intractable, without status epilepticus: Secondary | ICD-10-CM | POA: Diagnosis not present

## 2018-09-25 DIAGNOSIS — Z7901 Long term (current) use of anticoagulants: Secondary | ICD-10-CM | POA: Diagnosis not present

## 2018-09-25 DIAGNOSIS — D631 Anemia in chronic kidney disease: Secondary | ICD-10-CM | POA: Diagnosis not present

## 2018-09-25 DIAGNOSIS — N186 End stage renal disease: Secondary | ICD-10-CM | POA: Diagnosis not present

## 2018-09-25 DIAGNOSIS — E1122 Type 2 diabetes mellitus with diabetic chronic kidney disease: Secondary | ICD-10-CM | POA: Diagnosis not present

## 2018-09-25 DIAGNOSIS — N2581 Secondary hyperparathyroidism of renal origin: Secondary | ICD-10-CM | POA: Diagnosis not present

## 2018-09-25 DIAGNOSIS — Z992 Dependence on renal dialysis: Secondary | ICD-10-CM | POA: Diagnosis not present

## 2018-09-27 DIAGNOSIS — Z992 Dependence on renal dialysis: Secondary | ICD-10-CM | POA: Diagnosis not present

## 2018-09-27 DIAGNOSIS — D631 Anemia in chronic kidney disease: Secondary | ICD-10-CM | POA: Diagnosis not present

## 2018-09-27 DIAGNOSIS — Z7901 Long term (current) use of anticoagulants: Secondary | ICD-10-CM | POA: Diagnosis not present

## 2018-09-27 DIAGNOSIS — N2581 Secondary hyperparathyroidism of renal origin: Secondary | ICD-10-CM | POA: Diagnosis not present

## 2018-09-27 DIAGNOSIS — N186 End stage renal disease: Secondary | ICD-10-CM | POA: Diagnosis not present

## 2018-09-27 DIAGNOSIS — E1122 Type 2 diabetes mellitus with diabetic chronic kidney disease: Secondary | ICD-10-CM | POA: Diagnosis not present

## 2018-09-28 DIAGNOSIS — N186 End stage renal disease: Secondary | ICD-10-CM | POA: Diagnosis not present

## 2018-09-28 DIAGNOSIS — D631 Anemia in chronic kidney disease: Secondary | ICD-10-CM | POA: Diagnosis not present

## 2018-09-28 DIAGNOSIS — Z992 Dependence on renal dialysis: Secondary | ICD-10-CM | POA: Diagnosis not present

## 2018-09-28 DIAGNOSIS — N2581 Secondary hyperparathyroidism of renal origin: Secondary | ICD-10-CM | POA: Diagnosis not present

## 2018-09-28 DIAGNOSIS — Z7901 Long term (current) use of anticoagulants: Secondary | ICD-10-CM | POA: Diagnosis not present

## 2018-09-28 DIAGNOSIS — E1122 Type 2 diabetes mellitus with diabetic chronic kidney disease: Secondary | ICD-10-CM | POA: Diagnosis not present

## 2018-09-29 DIAGNOSIS — N186 End stage renal disease: Secondary | ICD-10-CM | POA: Diagnosis not present

## 2018-09-29 DIAGNOSIS — D631 Anemia in chronic kidney disease: Secondary | ICD-10-CM | POA: Diagnosis not present

## 2018-09-29 DIAGNOSIS — Z992 Dependence on renal dialysis: Secondary | ICD-10-CM | POA: Diagnosis not present

## 2018-09-29 DIAGNOSIS — E1122 Type 2 diabetes mellitus with diabetic chronic kidney disease: Secondary | ICD-10-CM | POA: Diagnosis not present

## 2018-09-29 DIAGNOSIS — N2581 Secondary hyperparathyroidism of renal origin: Secondary | ICD-10-CM | POA: Diagnosis not present

## 2018-09-29 DIAGNOSIS — Z7901 Long term (current) use of anticoagulants: Secondary | ICD-10-CM | POA: Diagnosis not present

## 2018-10-02 DIAGNOSIS — E1122 Type 2 diabetes mellitus with diabetic chronic kidney disease: Secondary | ICD-10-CM | POA: Diagnosis not present

## 2018-10-02 DIAGNOSIS — Z7901 Long term (current) use of anticoagulants: Secondary | ICD-10-CM | POA: Diagnosis not present

## 2018-10-02 DIAGNOSIS — D631 Anemia in chronic kidney disease: Secondary | ICD-10-CM | POA: Diagnosis not present

## 2018-10-02 DIAGNOSIS — N186 End stage renal disease: Secondary | ICD-10-CM | POA: Diagnosis not present

## 2018-10-02 DIAGNOSIS — E1121 Type 2 diabetes mellitus with diabetic nephropathy: Secondary | ICD-10-CM | POA: Diagnosis not present

## 2018-10-02 DIAGNOSIS — G40909 Epilepsy, unspecified, not intractable, without status epilepticus: Secondary | ICD-10-CM | POA: Diagnosis not present

## 2018-10-02 DIAGNOSIS — I2510 Atherosclerotic heart disease of native coronary artery without angina pectoris: Secondary | ICD-10-CM | POA: Diagnosis not present

## 2018-10-02 DIAGNOSIS — N2581 Secondary hyperparathyroidism of renal origin: Secondary | ICD-10-CM | POA: Diagnosis not present

## 2018-10-02 DIAGNOSIS — Z992 Dependence on renal dialysis: Secondary | ICD-10-CM | POA: Diagnosis not present

## 2018-10-02 DIAGNOSIS — I10 Essential (primary) hypertension: Secondary | ICD-10-CM | POA: Diagnosis not present

## 2018-10-04 DIAGNOSIS — Z7901 Long term (current) use of anticoagulants: Secondary | ICD-10-CM | POA: Diagnosis not present

## 2018-10-04 DIAGNOSIS — D631 Anemia in chronic kidney disease: Secondary | ICD-10-CM | POA: Diagnosis not present

## 2018-10-04 DIAGNOSIS — N186 End stage renal disease: Secondary | ICD-10-CM | POA: Diagnosis not present

## 2018-10-04 DIAGNOSIS — N2581 Secondary hyperparathyroidism of renal origin: Secondary | ICD-10-CM | POA: Diagnosis not present

## 2018-10-04 DIAGNOSIS — E1122 Type 2 diabetes mellitus with diabetic chronic kidney disease: Secondary | ICD-10-CM | POA: Diagnosis not present

## 2018-10-04 DIAGNOSIS — Z992 Dependence on renal dialysis: Secondary | ICD-10-CM | POA: Diagnosis not present

## 2018-11-06 DEATH — deceased

## 2020-09-04 IMAGING — CT CT HEAD W/O CM
3 series · 15 of 47 positions shown, 18 images · non-contrast
Comparison: 02/08/2018

CLINICAL DATA: Syncope with unwitnessed seizure

EXAM:
CT HEAD WITHOUT CONTRAST
TECHNIQUE: Contiguous axial images were obtained from the base of the skull
through the vertex without intravenous contrast.

[Series 3: head 5.0 h30s · axial · 0.46mm/px · z∈[-149,-14]mm · 9 of 33 slices shown, 12 images]
[im 3/33  brain]
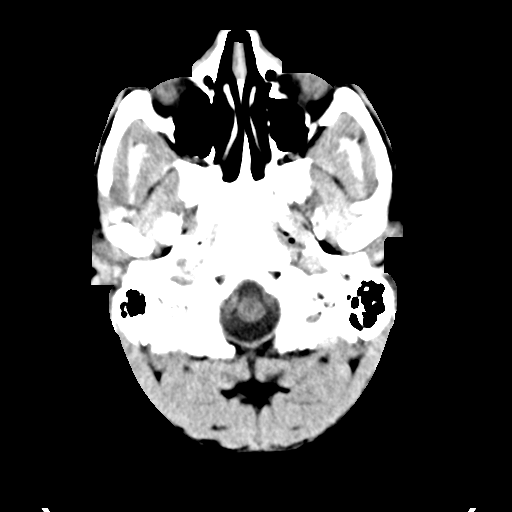
[im 3/33  bone]
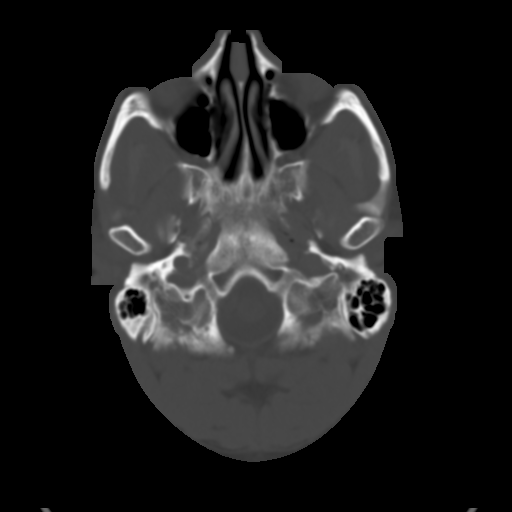
[im 6/33  brain]
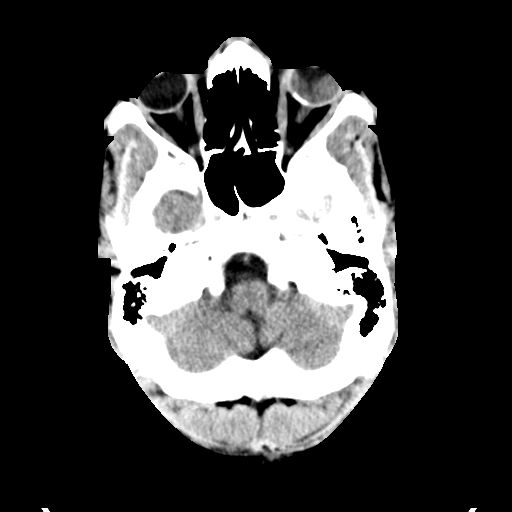
[im 9/33  brain]
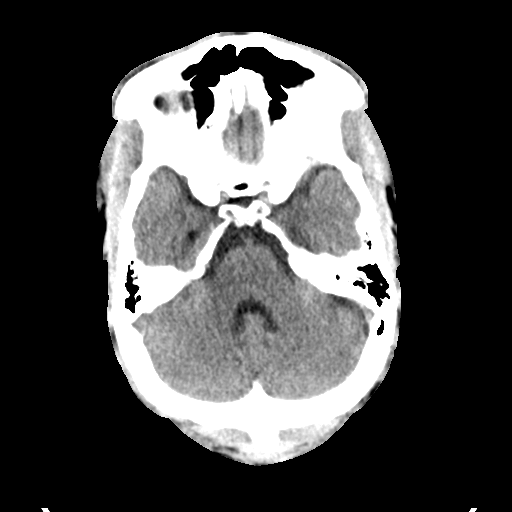
[im 13/33  brain]
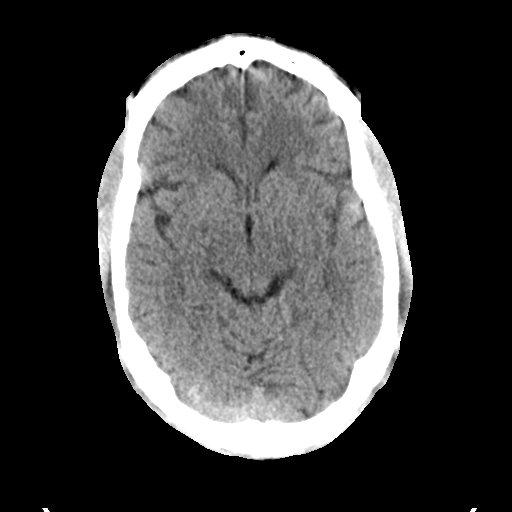
[im 17/33  brain]
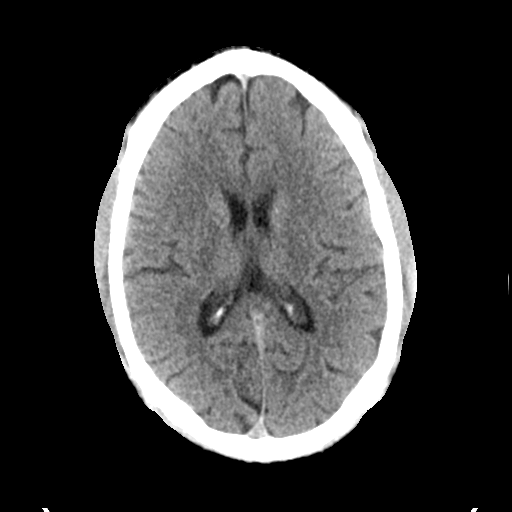
[im 17/33  bone]
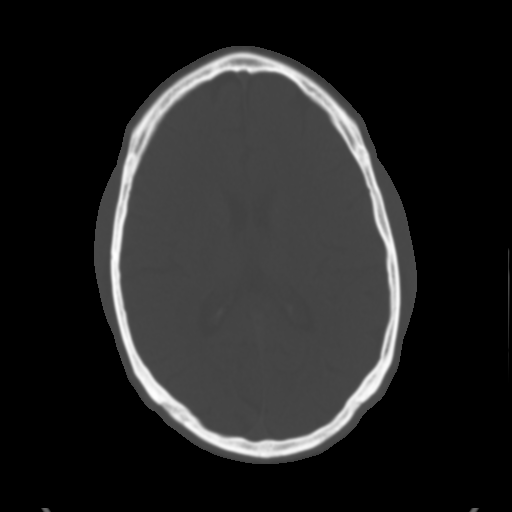
[im 20/33  brain]
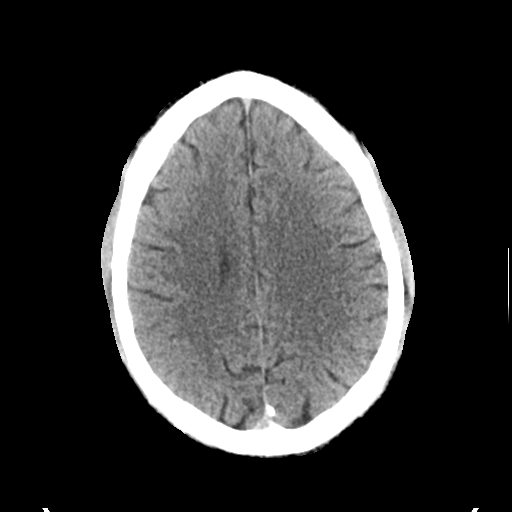
[im 24/33  brain]
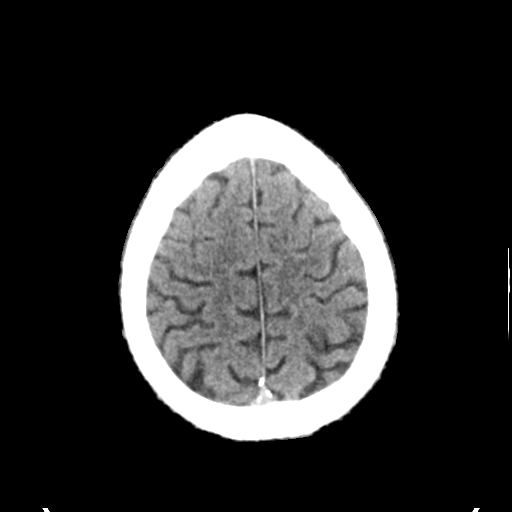
[im 27/33  brain]
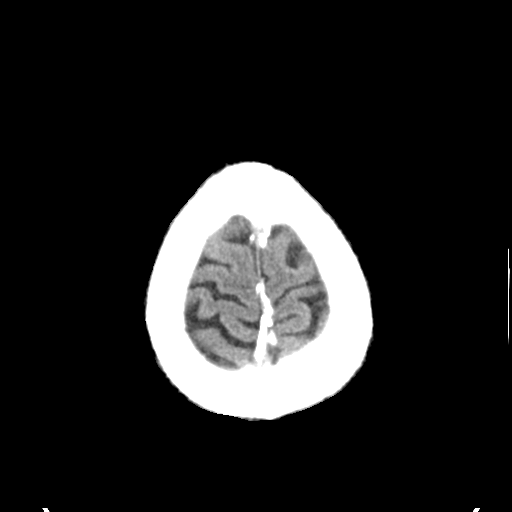
[im 30/33  brain]
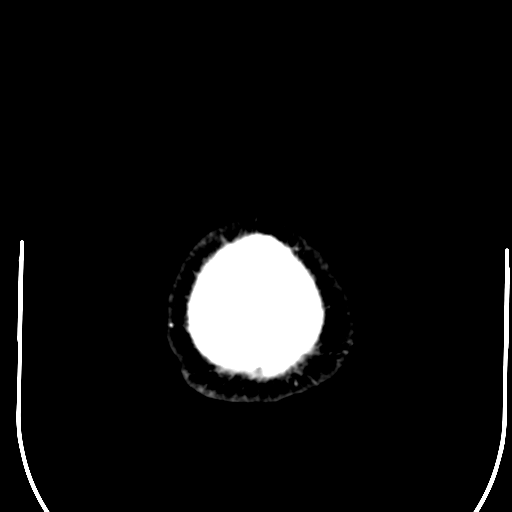
[im 30/33  bone]
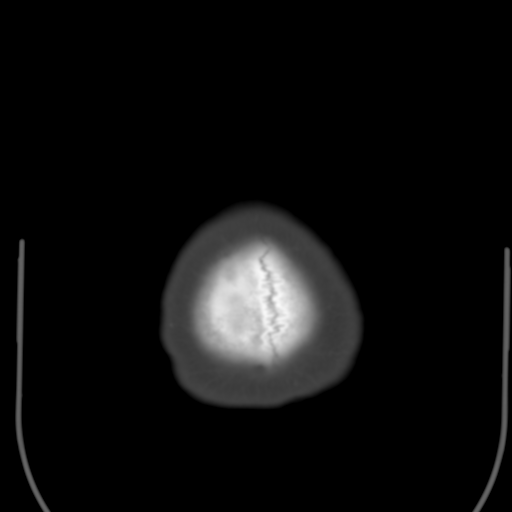

[Series 5: head 3.0 mpr cor · coronal · 0.33mm/px · 3 of 73 slices shown]
[im 25/73  brain]
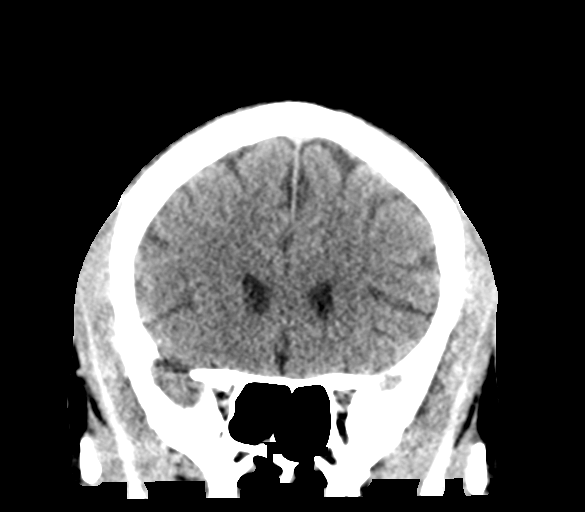
[im 33/73  brain]
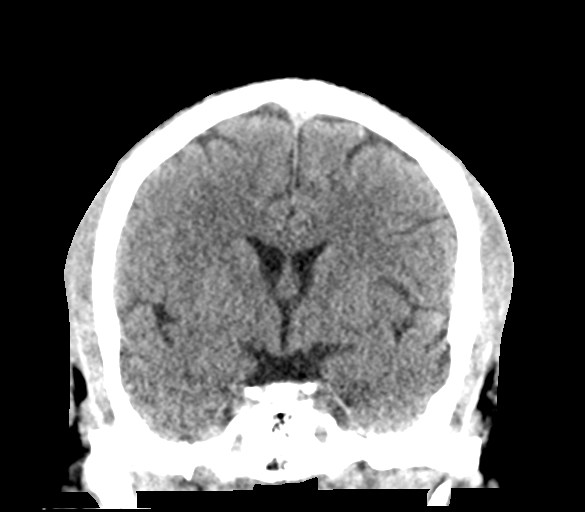
[im 41/73  brain]
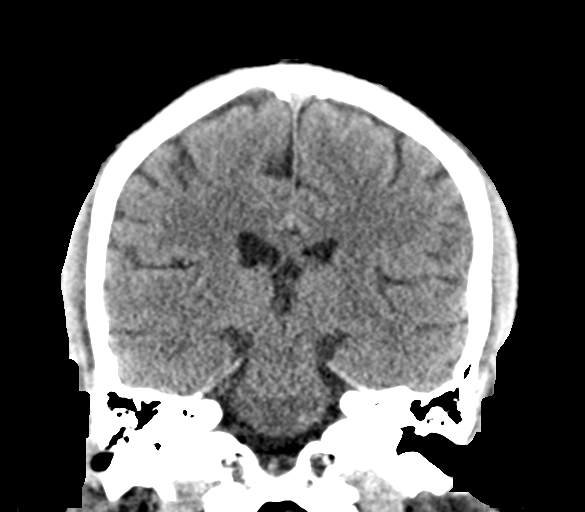

[Series 6: head 3.0 mpr sag · sagittal · 0.36mm/px · 3 of 62 slices shown]
[im 21/62  brain]
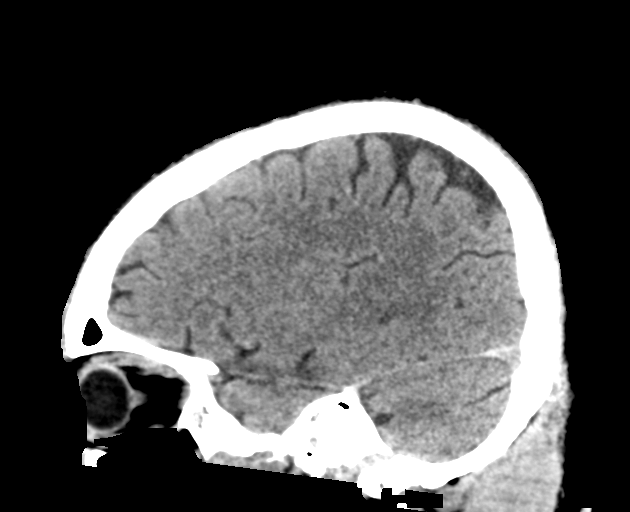
[im 31/62  brain]
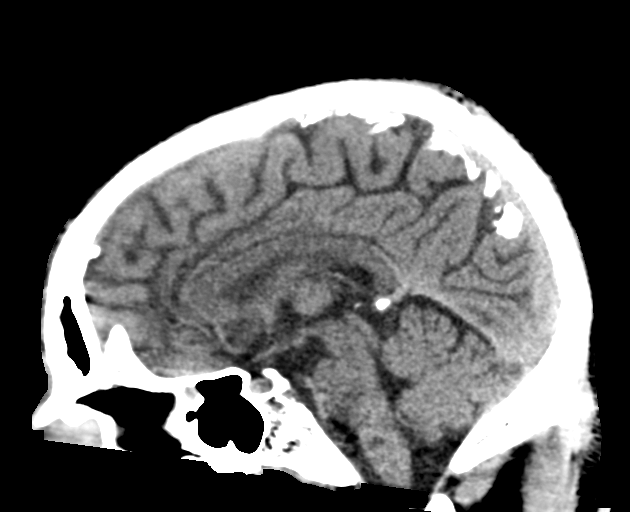
[im 41/62  brain]
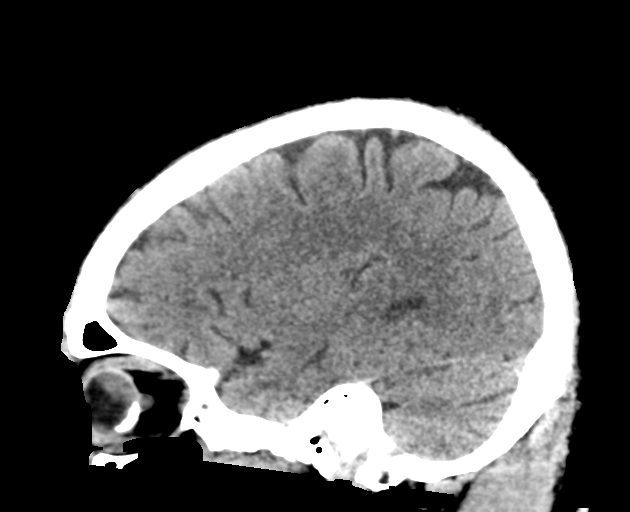

[15 of 47 positions shown; findings below may reference images not displayed]

FINDINGS: Brain: No evidence of acute infarction, hemorrhage, hydrocephalus,
extra-axial collection or mass lesion/mass effect.

Vascular: No hyperdense vessel or unexpected calcification.

Skull: Normal. Negative for fracture or focal lesion.

Sinuses/Orbits: Stable curvilinear calcification along the posterior
aspect of the left globe with what may represent chronic choroid
detachment accounting for elliptical densities along the posterior
aspect of the globe from effusions. Chronic retinal detachment is
also possibility accounting for this appearance. Clear paranasal
sinuses and mastoids.

Other: None
IMPRESSION: 1. No acute intracranial abnormality.
2. Chronic changes of the left globe that may represent chronic
choroid with effusion or retinal detachment.

## 2020-09-07 IMAGING — DX DG CHEST 1V PORT
1 series · 1 of 1 positions shown · non-contrast
Comparison: 03/14/2018.

CLINICAL DATA: Intubation.  Respiratory failure.

EXAM:
PORTABLE CHEST 1 VIEW

[chest]
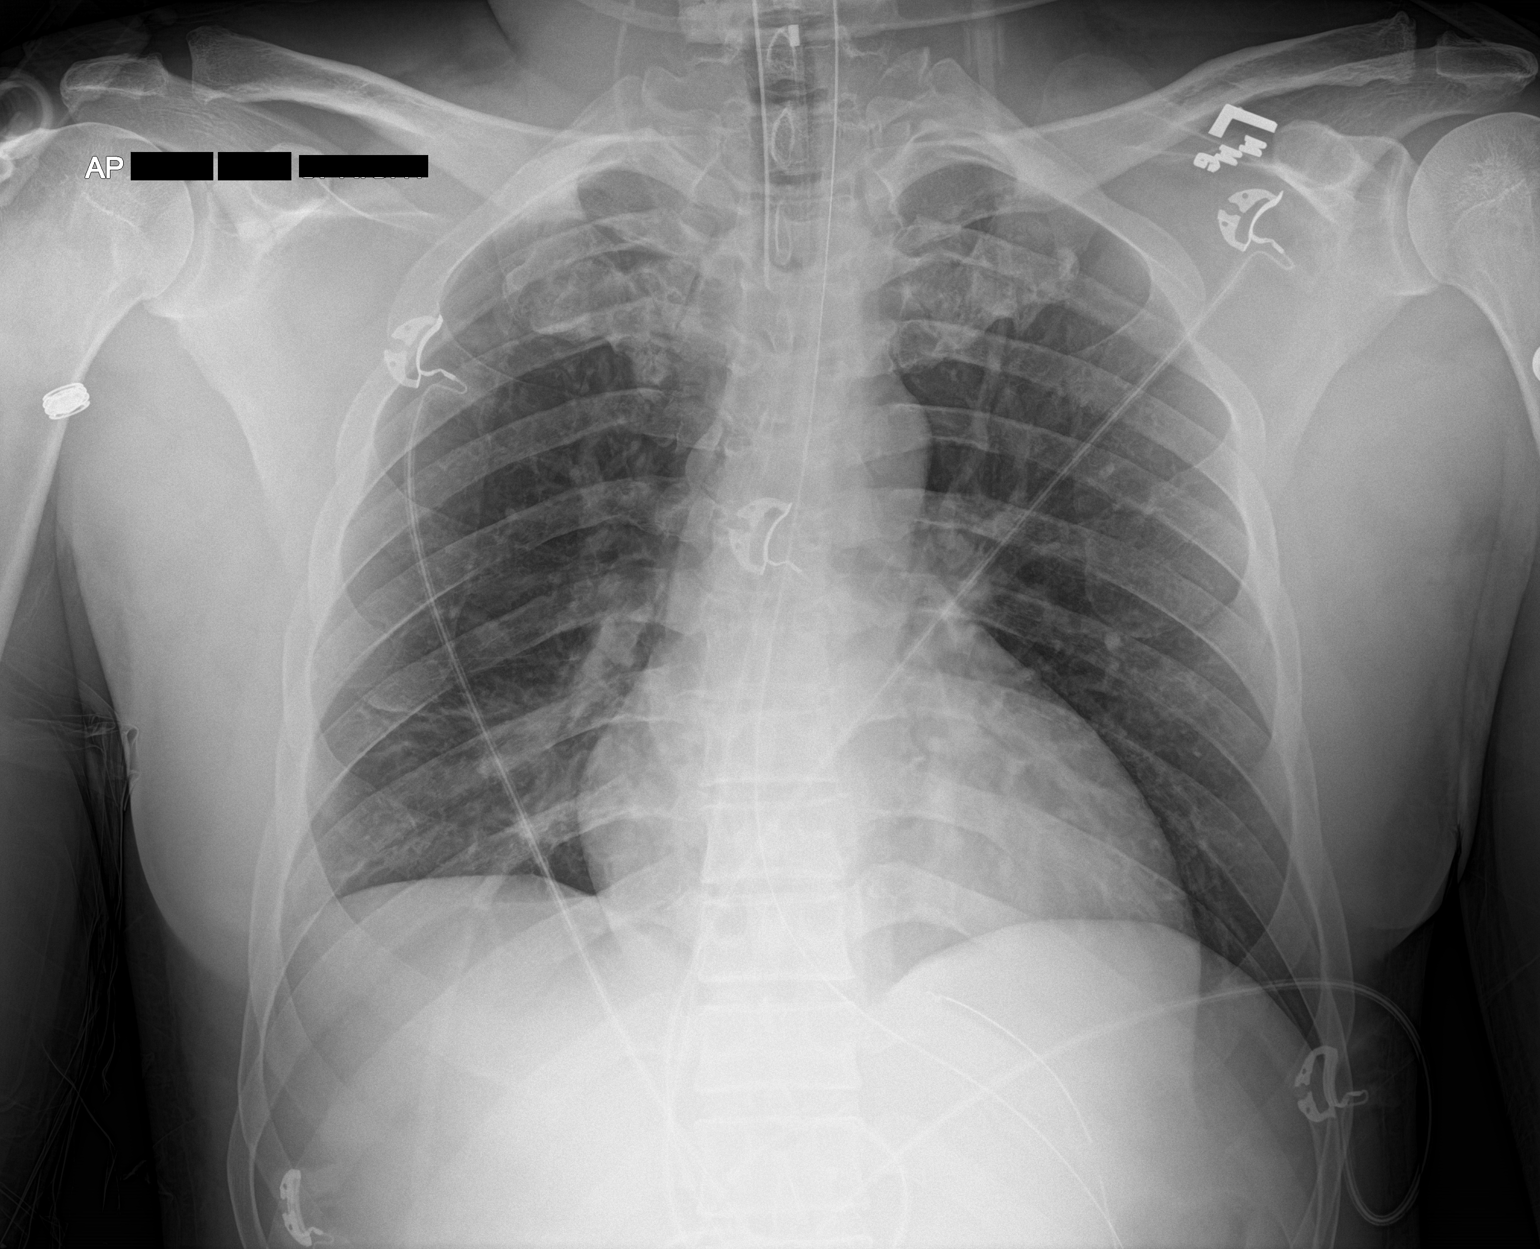

[1 of 1 positions shown; findings below may reference images not displayed]

FINDINGS: Endotracheal tube and NG tube in stable position. Cardiomegaly.
Interim improvement of pulmonary venous congestion.. Mild
atelectasis right lung base. Tiny right pleural effusion. No
pneumothorax.
IMPRESSION: 1.  Lines and tubes in stable position.

2.  Mild atelectasis right lung base.  Tiny right pleural effusion.

3. Stable cardiomegaly. Interim improvement of pulmonary venous
congestion.
